# Patient Record
Sex: Female | Born: 1935 | Race: Black or African American | Hispanic: No | Smoking: Never smoker
Health system: Southern US, Community
[De-identification: ages and names within clinical notes are randomized; demographics above are authoritative.]

## PROBLEM LIST (undated history)

## (undated) DIAGNOSIS — J302 Other seasonal allergic rhinitis: Secondary | ICD-10-CM

## (undated) DIAGNOSIS — F0390 Unspecified dementia without behavioral disturbance: Secondary | ICD-10-CM

## (undated) DIAGNOSIS — E785 Hyperlipidemia, unspecified: Secondary | ICD-10-CM

## (undated) DIAGNOSIS — N183 Chronic kidney disease, stage 3 unspecified: Secondary | ICD-10-CM

## (undated) DIAGNOSIS — I509 Heart failure, unspecified: Secondary | ICD-10-CM

## (undated) DIAGNOSIS — E119 Type 2 diabetes mellitus without complications: Secondary | ICD-10-CM

## (undated) DIAGNOSIS — G4730 Sleep apnea, unspecified: Secondary | ICD-10-CM

## (undated) DIAGNOSIS — M1990 Unspecified osteoarthritis, unspecified site: Secondary | ICD-10-CM

## (undated) DIAGNOSIS — IMO0001 Reserved for inherently not codable concepts without codable children: Secondary | ICD-10-CM

## (undated) DIAGNOSIS — G459 Transient cerebral ischemic attack, unspecified: Secondary | ICD-10-CM

## (undated) DIAGNOSIS — I10 Essential (primary) hypertension: Secondary | ICD-10-CM

## (undated) HISTORY — DX: Essential (primary) hypertension: I10

## (undated) HISTORY — DX: Hyperlipidemia, unspecified: E78.5

## (undated) HISTORY — DX: Type 2 diabetes mellitus without complications: E11.9

## (undated) HISTORY — PX: EYE SURGERY: SHX253

## (undated) HISTORY — PX: COLONOSCOPY: SHX174

## (undated) HISTORY — PX: MULTIPLE TOOTH EXTRACTIONS: SHX2053

---

## 1972-08-08 HISTORY — PX: ABDOMINAL HYSTERECTOMY: SHX81

## 2001-01-30 ENCOUNTER — Encounter: Admission: RE | Admit: 2001-01-30 | Discharge: 2001-04-30 | Payer: Self-pay | Admitting: Internal Medicine

## 2001-05-02 ENCOUNTER — Encounter: Admission: RE | Admit: 2001-05-02 | Discharge: 2001-07-31 | Payer: Self-pay | Admitting: Internal Medicine

## 2003-03-17 ENCOUNTER — Encounter: Admission: RE | Admit: 2003-03-17 | Discharge: 2003-03-17 | Payer: Self-pay | Admitting: Internal Medicine

## 2003-03-17 ENCOUNTER — Encounter: Payer: Self-pay | Admitting: Internal Medicine

## 2003-10-14 ENCOUNTER — Ambulatory Visit (HOSPITAL_COMMUNITY): Admission: RE | Admit: 2003-10-14 | Discharge: 2003-10-15 | Payer: Self-pay | Admitting: Ophthalmology

## 2006-09-18 ENCOUNTER — Encounter (INDEPENDENT_AMBULATORY_CARE_PROVIDER_SITE_OTHER): Payer: Self-pay | Admitting: Cardiology

## 2006-09-18 ENCOUNTER — Inpatient Hospital Stay (HOSPITAL_COMMUNITY): Admission: EM | Admit: 2006-09-18 | Discharge: 2006-09-22 | Payer: Self-pay | Admitting: Emergency Medicine

## 2006-10-02 ENCOUNTER — Emergency Department (HOSPITAL_COMMUNITY): Admission: EM | Admit: 2006-10-02 | Discharge: 2006-10-03 | Payer: Self-pay | Admitting: *Deleted

## 2006-10-25 ENCOUNTER — Emergency Department (HOSPITAL_COMMUNITY): Admission: EM | Admit: 2006-10-25 | Discharge: 2006-10-25 | Payer: Self-pay | Admitting: Emergency Medicine

## 2007-04-05 ENCOUNTER — Encounter (HOSPITAL_COMMUNITY): Admission: RE | Admit: 2007-04-05 | Discharge: 2007-07-04 | Payer: Self-pay | Admitting: Nephrology

## 2007-07-12 ENCOUNTER — Encounter (HOSPITAL_COMMUNITY): Admission: RE | Admit: 2007-07-12 | Discharge: 2007-10-10 | Payer: Self-pay | Admitting: Nephrology

## 2007-10-16 ENCOUNTER — Encounter (HOSPITAL_COMMUNITY): Admission: RE | Admit: 2007-10-16 | Discharge: 2007-10-18 | Payer: Self-pay | Admitting: Nephrology

## 2007-10-16 ENCOUNTER — Encounter (HOSPITAL_COMMUNITY): Admission: RE | Admit: 2007-10-16 | Discharge: 2008-01-14 | Payer: Self-pay | Admitting: Nephrology

## 2008-01-24 ENCOUNTER — Encounter (HOSPITAL_COMMUNITY): Admission: RE | Admit: 2008-01-24 | Discharge: 2008-04-23 | Payer: Self-pay | Admitting: Nephrology

## 2008-05-01 ENCOUNTER — Encounter (HOSPITAL_COMMUNITY): Admission: RE | Admit: 2008-05-01 | Discharge: 2008-07-30 | Payer: Self-pay | Admitting: Nephrology

## 2008-08-07 ENCOUNTER — Encounter (HOSPITAL_COMMUNITY): Admission: RE | Admit: 2008-08-07 | Discharge: 2008-08-07 | Payer: Self-pay | Admitting: Nephrology

## 2008-08-08 ENCOUNTER — Encounter (HOSPITAL_COMMUNITY): Admission: RE | Admit: 2008-08-08 | Discharge: 2008-11-06 | Payer: Self-pay | Admitting: Nephrology

## 2008-12-04 ENCOUNTER — Encounter (HOSPITAL_COMMUNITY): Admission: RE | Admit: 2008-12-04 | Discharge: 2009-03-04 | Payer: Self-pay | Admitting: Nephrology

## 2009-03-19 ENCOUNTER — Encounter (HOSPITAL_COMMUNITY): Admission: RE | Admit: 2009-03-19 | Discharge: 2009-06-17 | Payer: Self-pay | Admitting: Nephrology

## 2009-06-30 ENCOUNTER — Encounter (HOSPITAL_COMMUNITY): Admission: RE | Admit: 2009-06-30 | Discharge: 2009-09-28 | Payer: Self-pay | Admitting: Nephrology

## 2009-10-08 ENCOUNTER — Encounter (HOSPITAL_COMMUNITY): Admission: RE | Admit: 2009-10-08 | Discharge: 2010-01-06 | Payer: Self-pay | Admitting: Nephrology

## 2010-01-21 ENCOUNTER — Encounter (HOSPITAL_COMMUNITY): Admission: RE | Admit: 2010-01-21 | Discharge: 2010-04-21 | Payer: Self-pay | Admitting: Nephrology

## 2010-05-11 ENCOUNTER — Encounter (HOSPITAL_COMMUNITY)
Admission: RE | Admit: 2010-05-11 | Discharge: 2010-08-09 | Payer: Self-pay | Source: Home / Self Care | Attending: Nephrology | Admitting: Nephrology

## 2010-08-24 ENCOUNTER — Encounter (HOSPITAL_COMMUNITY)
Admission: RE | Admit: 2010-08-24 | Discharge: 2010-09-07 | Payer: Self-pay | Source: Home / Self Care | Attending: Nephrology | Admitting: Nephrology

## 2010-08-25 LAB — POCT HEMOGLOBIN-HEMACUE: Hemoglobin: 11.5 g/dL — ABNORMAL LOW (ref 12.0–15.0)

## 2010-09-14 ENCOUNTER — Other Ambulatory Visit: Payer: Self-pay

## 2010-09-14 ENCOUNTER — Encounter (HOSPITAL_COMMUNITY)
Admission: RE | Admit: 2010-09-14 | Discharge: 2010-09-14 | Disposition: A | Discharge status: Home / Self Care | Payer: Medicare Other | Source: Ambulatory Visit | Attending: Nephrology | Admitting: Nephrology

## 2010-09-14 DIAGNOSIS — N183 Chronic kidney disease, stage 3 unspecified: Secondary | ICD-10-CM | POA: Insufficient documentation

## 2010-09-14 DIAGNOSIS — D638 Anemia in other chronic diseases classified elsewhere: Secondary | ICD-10-CM | POA: Insufficient documentation

## 2010-09-15 LAB — POCT HEMOGLOBIN-HEMACUE: Hemoglobin: 11.7 g/dL — ABNORMAL LOW (ref 12.0–15.0)

## 2010-10-05 ENCOUNTER — Other Ambulatory Visit: Payer: Self-pay

## 2010-10-05 ENCOUNTER — Encounter (HOSPITAL_COMMUNITY): Payer: Medicare Other

## 2010-10-05 LAB — POCT HEMOGLOBIN-HEMACUE: Hemoglobin: 10.7 g/dL — ABNORMAL LOW (ref 12.0–15.0)

## 2010-10-18 LAB — POCT HEMOGLOBIN-HEMACUE
Hemoglobin: 10.9 g/dL — ABNORMAL LOW (ref 12.0–15.0)
Hemoglobin: 11.2 g/dL — ABNORMAL LOW (ref 12.0–15.0)

## 2010-10-18 LAB — COMPREHENSIVE METABOLIC PANEL WITH GFR
ALT: 15 U/L (ref 0–35)
AST: 21 U/L (ref 0–37)
Albumin: 3.3 g/dL — ABNORMAL LOW (ref 3.5–5.2)
Alkaline Phosphatase: 79 U/L (ref 39–117)
BUN: 23 mg/dL (ref 6–23)
CO2: 33 meq/L — ABNORMAL HIGH (ref 19–32)
Calcium: 9.2 mg/dL (ref 8.4–10.5)
Chloride: 102 meq/L (ref 96–112)
Creatinine, Ser: 1.38 mg/dL — ABNORMAL HIGH (ref 0.4–1.2)
GFR calc non Af Amer: 38 mL/min — ABNORMAL LOW
Glucose, Bld: 105 mg/dL — ABNORMAL HIGH (ref 70–99)
Potassium: 3.5 meq/L (ref 3.5–5.1)
Sodium: 140 meq/L (ref 135–145)
Total Bilirubin: 0.2 mg/dL — ABNORMAL LOW (ref 0.3–1.2)
Total Protein: 6.7 g/dL (ref 6.0–8.3)

## 2010-10-18 LAB — IRON AND TIBC
Iron: 90 ug/dL (ref 42–135)
Saturation Ratios: 36 % (ref 20–55)
TIBC: 250 ug/dL (ref 250–470)
UIBC: 160 ug/dL

## 2010-10-18 LAB — PHOSPHORUS: Phosphorus: 3.3 mg/dL (ref 2.3–4.6)

## 2010-10-18 LAB — FERRITIN: Ferritin: 539 ng/mL — ABNORMAL HIGH (ref 10–291)

## 2010-10-18 LAB — PTH, INTACT AND CALCIUM
Calcium, Total (PTH): 9.3 mg/dL (ref 8.4–10.5)
PTH: 122.1 pg/mL — ABNORMAL HIGH (ref 14.0–72.0)

## 2010-10-19 LAB — POCT HEMOGLOBIN-HEMACUE: Hemoglobin: 11.1 g/dL — ABNORMAL LOW (ref 12.0–15.0)

## 2010-10-20 LAB — POCT HEMOGLOBIN-HEMACUE
Hemoglobin: 11.5 g/dL — ABNORMAL LOW (ref 12.0–15.0)
Hemoglobin: 11.6 g/dL — ABNORMAL LOW (ref 12.0–15.0)

## 2010-10-21 LAB — COMPREHENSIVE METABOLIC PANEL
ALT: 17 U/L (ref 0–35)
AST: 23 U/L (ref 0–37)
Albumin: 3.4 g/dL — ABNORMAL LOW (ref 3.5–5.2)
Alkaline Phosphatase: 76 U/L (ref 39–117)
BUN: 18 mg/dL (ref 6–23)
CO2: 28 mEq/L (ref 19–32)
Calcium: 9 mg/dL (ref 8.4–10.5)
Chloride: 106 mEq/L (ref 96–112)
Creatinine, Ser: 1.26 mg/dL — ABNORMAL HIGH (ref 0.4–1.2)
GFR calc Af Amer: 51 mL/min — ABNORMAL LOW (ref >60)
GFR calc non Af Amer: 42 mL/min — ABNORMAL LOW (ref >60)
Glucose, Bld: 144 mg/dL — ABNORMAL HIGH (ref 70–99)
Potassium: 3.6 mEq/L (ref 3.5–5.1)
Sodium: 142 mEq/L (ref 135–145)
Total Bilirubin: 0.5 mg/dL (ref 0.3–1.2)
Total Protein: 6.9 g/dL (ref 6.0–8.3)

## 2010-10-21 LAB — IRON AND TIBC
Iron: 98 ug/dL (ref 42–135)
Saturation Ratios: 41 % (ref 20–55)
TIBC: 239 ug/dL — ABNORMAL LOW (ref 250–470)
UIBC: 141 ug/dL

## 2010-10-21 LAB — PHOSPHORUS: Phosphorus: 3.5 mg/dL (ref 2.3–4.6)

## 2010-10-21 LAB — CBC
HCT: 36.7 % (ref 36.0–46.0)
Hemoglobin: 11.8 g/dL — ABNORMAL LOW (ref 12.0–15.0)
MCH: 22.9 pg — ABNORMAL LOW (ref 26.0–34.0)
MCHC: 32.2 g/dL (ref 30.0–36.0)
MCV: 71.1 fL — ABNORMAL LOW (ref 78.0–100.0)
Platelets: 212 10*3/uL (ref 150–400)
RBC: 5.16 MIL/uL — ABNORMAL HIGH (ref 3.87–5.11)
RDW: 17.2 % — ABNORMAL HIGH (ref 11.5–15.5)
WBC: 5 10*3/uL (ref 4.0–10.5)

## 2010-10-21 LAB — PTH, INTACT AND CALCIUM
Calcium, Total (PTH): 9 mg/dL (ref 8.4–10.5)
PTH: 136.2 pg/mL — ABNORMAL HIGH (ref 14.0–72.0)

## 2010-10-21 LAB — POCT HEMOGLOBIN-HEMACUE: Hemoglobin: 10.7 g/dL — ABNORMAL LOW (ref 12.0–15.0)

## 2010-10-21 LAB — FERRITIN: Ferritin: 634 ng/mL — ABNORMAL HIGH (ref 10–291)

## 2010-10-23 LAB — POCT HEMOGLOBIN-HEMACUE: Hemoglobin: 10.7 g/dL — ABNORMAL LOW (ref 12.0–15.0)

## 2010-10-24 LAB — COMPREHENSIVE METABOLIC PANEL
ALT: 15 U/L (ref 0–35)
AST: 19 U/L (ref 0–37)
Albumin: 3.2 g/dL — ABNORMAL LOW (ref 3.5–5.2)
Alkaline Phosphatase: 72 U/L (ref 39–117)
BUN: 17 mg/dL (ref 6–23)
CO2: 28 mEq/L (ref 19–32)
Calcium: 8.5 mg/dL (ref 8.4–10.5)
Chloride: 106 mEq/L (ref 96–112)
Creatinine, Ser: 1.12 mg/dL (ref 0.4–1.2)
GFR calc Af Amer: 58 mL/min — ABNORMAL LOW (ref >60)
GFR calc non Af Amer: 48 mL/min — ABNORMAL LOW (ref >60)
Glucose, Bld: 78 mg/dL (ref 70–99)
Potassium: 3.8 mEq/L (ref 3.5–5.1)
Sodium: 139 mEq/L (ref 135–145)
Total Bilirubin: 0.5 mg/dL (ref 0.3–1.2)
Total Protein: 6.8 g/dL (ref 6.0–8.3)

## 2010-10-24 LAB — IRON AND TIBC
Iron: 45 ug/dL (ref 42–135)
Saturation Ratios: 17 % — ABNORMAL LOW (ref 20–55)
TIBC: 265 ug/dL (ref 250–470)
UIBC: 220 ug/dL

## 2010-10-24 LAB — PTH, INTACT AND CALCIUM
Calcium, Total (PTH): 8.5 mg/dL (ref 8.4–10.5)
PTH: 218.8 pg/mL — ABNORMAL HIGH (ref 14.0–72.0)

## 2010-10-24 LAB — POCT HEMOGLOBIN-HEMACUE
Hemoglobin: 13.2 g/dL (ref 12.0–15.0)
Hemoglobin: 12.3 g/dL (ref 12.0–15.0)
Hemoglobin: 11 g/dL — ABNORMAL LOW (ref 12.0–15.0)
Hemoglobin: 10.8 g/dL — ABNORMAL LOW (ref 12.0–15.0)

## 2010-10-24 LAB — FERRITIN: Ferritin: 170 ng/mL (ref 10–291)

## 2010-10-24 LAB — PHOSPHORUS: Phosphorus: 4 mg/dL (ref 2.3–4.6)

## 2010-10-25 LAB — POCT HEMOGLOBIN-HEMACUE: Hemoglobin: 10.1 g/dL — ABNORMAL LOW (ref 12.0–15.0)

## 2010-10-26 ENCOUNTER — Encounter (HOSPITAL_COMMUNITY): Payer: Medicare Other | Attending: Nephrology

## 2010-10-26 ENCOUNTER — Other Ambulatory Visit: Payer: Self-pay

## 2010-10-26 DIAGNOSIS — N183 Chronic kidney disease, stage 3 unspecified: Secondary | ICD-10-CM | POA: Insufficient documentation

## 2010-10-26 DIAGNOSIS — D638 Anemia in other chronic diseases classified elsewhere: Secondary | ICD-10-CM | POA: Insufficient documentation

## 2010-10-26 LAB — POCT HEMOGLOBIN-HEMACUE
Hemoglobin: 11.4 g/dL — ABNORMAL LOW (ref 12.0–15.0)
Hemoglobin: 10.8 g/dL — ABNORMAL LOW (ref 12.0–15.0)

## 2010-10-27 LAB — POCT HEMOGLOBIN-HEMACUE
Hemoglobin: 10.8 g/dL — ABNORMAL LOW (ref 12.0–15.0)
Hemoglobin: 10.9 g/dL — ABNORMAL LOW (ref 12.0–15.0)

## 2010-10-31 LAB — PTH, INTACT AND CALCIUM
Calcium, Total (PTH): 9.3 mg/dL (ref 8.4–10.5)
PTH: 180.7 pg/mL — ABNORMAL HIGH (ref 14.0–72.0)

## 2010-10-31 LAB — COMPREHENSIVE METABOLIC PANEL
ALT: 12 U/L (ref 0–35)
AST: 18 U/L (ref 0–37)
Albumin: 3.4 g/dL — ABNORMAL LOW (ref 3.5–5.2)
Alkaline Phosphatase: 80 U/L (ref 39–117)
BUN: 26 mg/dL — ABNORMAL HIGH (ref 6–23)
CO2: 30 mEq/L (ref 19–32)
Calcium: 9 mg/dL (ref 8.4–10.5)
Chloride: 101 mEq/L (ref 96–112)
Creatinine, Ser: 1.26 mg/dL — ABNORMAL HIGH (ref 0.4–1.2)
GFR calc Af Amer: 51 mL/min — ABNORMAL LOW (ref >60)
GFR calc non Af Amer: 42 mL/min — ABNORMAL LOW (ref >60)
Glucose, Bld: 129 mg/dL — ABNORMAL HIGH (ref 70–99)
Potassium: 3.4 mEq/L — ABNORMAL LOW (ref 3.5–5.1)
Sodium: 138 mEq/L (ref 135–145)
Total Bilirubin: 0.4 mg/dL (ref 0.3–1.2)
Total Protein: 7.5 g/dL (ref 6.0–8.3)

## 2010-10-31 LAB — IRON AND TIBC
Iron: 57 ug/dL (ref 42–135)
Saturation Ratios: 22 % (ref 20–55)
TIBC: 256 ug/dL (ref 250–470)
UIBC: 199 ug/dL

## 2010-10-31 LAB — POCT HEMOGLOBIN-HEMACUE
Hemoglobin: 10.6 g/dL — ABNORMAL LOW (ref 12.0–15.0)
Hemoglobin: 10.9 g/dL — ABNORMAL LOW (ref 12.0–15.0)

## 2010-10-31 LAB — PHOSPHORUS: Phosphorus: 3.7 mg/dL (ref 2.3–4.6)

## 2010-10-31 LAB — FERRITIN: Ferritin: 255 ng/mL (ref 10–291)

## 2010-11-08 LAB — COMPREHENSIVE METABOLIC PANEL
ALT: 11 U/L (ref 0–35)
AST: 20 U/L (ref 0–37)
Albumin: 3.7 g/dL (ref 3.5–5.2)
Alkaline Phosphatase: 69 U/L (ref 39–117)
BUN: 30 mg/dL — ABNORMAL HIGH (ref 6–23)
CO2: 30 mEq/L (ref 19–32)
Calcium: 9.2 mg/dL (ref 8.4–10.5)
Chloride: 93 mEq/L — ABNORMAL LOW (ref 96–112)
Creatinine, Ser: 1.52 mg/dL — ABNORMAL HIGH (ref 0.4–1.2)
GFR calc Af Amer: 41 mL/min — ABNORMAL LOW (ref >60)
GFR calc non Af Amer: 34 mL/min — ABNORMAL LOW (ref >60)
Glucose, Bld: 154 mg/dL — ABNORMAL HIGH (ref 70–99)
Potassium: 3.1 mEq/L — ABNORMAL LOW (ref 3.5–5.1)
Sodium: 134 mEq/L — ABNORMAL LOW (ref 135–145)
Total Bilirubin: 0.5 mg/dL (ref 0.3–1.2)
Total Protein: 7.8 g/dL (ref 6.0–8.3)

## 2010-11-08 LAB — IRON AND TIBC
Iron: 66 ug/dL (ref 42–135)
Saturation Ratios: 23 % (ref 20–55)
TIBC: 293 ug/dL (ref 250–470)
UIBC: 227 ug/dL

## 2010-11-08 LAB — POCT HEMOGLOBIN-HEMACUE: Hemoglobin: 11.7 g/dL — ABNORMAL LOW (ref 12.0–15.0)

## 2010-11-08 LAB — PTH, INTACT AND CALCIUM
Calcium, Total (PTH): 9.2 mg/dL (ref 8.4–10.5)
PTH: 233.8 pg/mL — ABNORMAL HIGH (ref 14.0–72.0)

## 2010-11-08 LAB — FERRITIN: Ferritin: 266 ng/mL (ref 10–291)

## 2010-11-08 LAB — PHOSPHORUS: Phosphorus: 4.4 mg/dL (ref 2.3–4.6)

## 2010-11-10 LAB — POCT HEMOGLOBIN-HEMACUE
Hemoglobin: 10.5 g/dL — ABNORMAL LOW (ref 12.0–15.0)
Hemoglobin: 11.1 g/dL — ABNORMAL LOW (ref 12.0–15.0)

## 2010-11-11 LAB — POCT HEMOGLOBIN-HEMACUE: Hemoglobin: 11.7 g/dL — ABNORMAL LOW (ref 12.0–15.0)

## 2010-11-12 LAB — COMPREHENSIVE METABOLIC PANEL
ALT: 11 U/L (ref 0–35)
AST: 22 U/L (ref 0–37)
Albumin: 3.4 g/dL — ABNORMAL LOW (ref 3.5–5.2)
Alkaline Phosphatase: 70 U/L (ref 39–117)
BUN: 21 mg/dL (ref 6–23)
CO2: 26 mEq/L (ref 19–32)
Calcium: 9.1 mg/dL (ref 8.4–10.5)
Chloride: 106 mEq/L (ref 96–112)
Creatinine, Ser: 1.22 mg/dL — ABNORMAL HIGH (ref 0.4–1.2)
GFR calc Af Amer: 53 mL/min — ABNORMAL LOW (ref >60)
GFR calc non Af Amer: 43 mL/min — ABNORMAL LOW (ref >60)
Glucose, Bld: 133 mg/dL — ABNORMAL HIGH (ref 70–99)
Potassium: 3.6 mEq/L (ref 3.5–5.1)
Sodium: 141 mEq/L (ref 135–145)
Total Bilirubin: 0.6 mg/dL (ref 0.3–1.2)
Total Protein: 7.5 g/dL (ref 6.0–8.3)

## 2010-11-12 LAB — IRON AND TIBC
Iron: 72 ug/dL (ref 42–135)
Saturation Ratios: 27 % (ref 20–55)
TIBC: 262 ug/dL (ref 250–470)
UIBC: 190 ug/dL

## 2010-11-12 LAB — PTH, INTACT AND CALCIUM
Calcium, Total (PTH): 9 mg/dL (ref 8.4–10.5)
PTH: 127.2 pg/mL — ABNORMAL HIGH (ref 14.0–72.0)

## 2010-11-12 LAB — FERRITIN: Ferritin: 256 ng/mL (ref 10–291)

## 2010-11-12 LAB — POCT HEMOGLOBIN-HEMACUE
Hemoglobin: 11.5 g/dL — ABNORMAL LOW (ref 12.0–15.0)
Hemoglobin: 11.1 g/dL — ABNORMAL LOW (ref 12.0–15.0)

## 2010-11-12 LAB — PHOSPHORUS: Phosphorus: 4 mg/dL (ref 2.3–4.6)

## 2010-11-13 LAB — IRON AND TIBC
Iron: 67 ug/dL (ref 42–135)
Saturation Ratios: 25 % (ref 20–55)
TIBC: 272 ug/dL (ref 250–470)
UIBC: 205 ug/dL

## 2010-11-13 LAB — COMPREHENSIVE METABOLIC PANEL
ALT: 11 U/L (ref 0–35)
AST: 19 U/L (ref 0–37)
Albumin: 3.6 g/dL (ref 3.5–5.2)
Alkaline Phosphatase: 73 U/L (ref 39–117)
BUN: 21 mg/dL (ref 6–23)
CO2: 31 mEq/L (ref 19–32)
Calcium: 9.3 mg/dL (ref 8.4–10.5)
Chloride: 101 mEq/L (ref 96–112)
Creatinine, Ser: 1.42 mg/dL — ABNORMAL HIGH (ref 0.4–1.2)
GFR calc Af Amer: 44 mL/min — ABNORMAL LOW (ref >60)
GFR calc non Af Amer: 36 mL/min — ABNORMAL LOW (ref >60)
Glucose, Bld: 112 mg/dL — ABNORMAL HIGH (ref 70–99)
Potassium: 3.1 mEq/L — ABNORMAL LOW (ref 3.5–5.1)
Sodium: 140 mEq/L (ref 135–145)
Total Bilirubin: 0.6 mg/dL (ref 0.3–1.2)
Total Protein: 7.4 g/dL (ref 6.0–8.3)

## 2010-11-13 LAB — PTH, INTACT AND CALCIUM
Calcium, Total (PTH): 9.5 mg/dL (ref 8.4–10.5)
PTH: 149.2 pg/mL — ABNORMAL HIGH (ref 14.0–72.0)

## 2010-11-13 LAB — FERRITIN: Ferritin: 208 ng/mL (ref 10–291)

## 2010-11-13 LAB — PHOSPHORUS: Phosphorus: 3.4 mg/dL (ref 2.3–4.6)

## 2010-11-13 LAB — POCT HEMOGLOBIN-HEMACUE: Hemoglobin: 11.3 g/dL — ABNORMAL LOW (ref 12.0–15.0)

## 2010-11-14 LAB — POCT HEMOGLOBIN-HEMACUE
Hemoglobin: 11.2 g/dL — ABNORMAL LOW (ref 12.0–15.0)
Hemoglobin: 10.6 g/dL — ABNORMAL LOW (ref 12.0–15.0)

## 2010-11-15 LAB — COMPREHENSIVE METABOLIC PANEL
ALT: 9 U/L (ref 0–35)
AST: 22 U/L (ref 0–37)
Albumin: 3.3 g/dL — ABNORMAL LOW (ref 3.5–5.2)
Alkaline Phosphatase: 66 U/L (ref 39–117)
BUN: 36 mg/dL — ABNORMAL HIGH (ref 6–23)
CO2: 29 mEq/L (ref 19–32)
Calcium: 9.2 mg/dL (ref 8.4–10.5)
Chloride: 101 mEq/L (ref 96–112)
Creatinine, Ser: 1.85 mg/dL — ABNORMAL HIGH (ref 0.4–1.2)
GFR calc Af Amer: 33 mL/min — ABNORMAL LOW (ref >60)
GFR calc non Af Amer: 27 mL/min — ABNORMAL LOW (ref >60)
Glucose, Bld: 96 mg/dL (ref 70–99)
Potassium: 3.3 mEq/L — ABNORMAL LOW (ref 3.5–5.1)
Sodium: 138 mEq/L (ref 135–145)
Total Bilirubin: 0.4 mg/dL (ref 0.3–1.2)
Total Protein: 7.3 g/dL (ref 6.0–8.3)

## 2010-11-15 LAB — IRON AND TIBC
Iron: 54 ug/dL (ref 42–135)
Saturation Ratios: 22 % (ref 20–55)
TIBC: 251 ug/dL (ref 250–470)
UIBC: 197 ug/dL

## 2010-11-15 LAB — POCT HEMOGLOBIN-HEMACUE: Hemoglobin: 10.6 g/dL — ABNORMAL LOW (ref 12.0–15.0)

## 2010-11-15 LAB — PTH, INTACT AND CALCIUM
Calcium, Total (PTH): 9.5 mg/dL (ref 8.4–10.5)
PTH: 194.8 pg/mL — ABNORMAL HIGH (ref 14.0–72.0)

## 2010-11-15 LAB — FERRITIN: Ferritin: 331 ng/mL — ABNORMAL HIGH (ref 10–291)

## 2010-11-15 LAB — PHOSPHORUS: Phosphorus: 4.8 mg/dL — ABNORMAL HIGH (ref 2.3–4.6)

## 2010-11-16 ENCOUNTER — Encounter (HOSPITAL_COMMUNITY): Payer: Medicare Other | Attending: Nephrology

## 2010-11-16 ENCOUNTER — Other Ambulatory Visit: Payer: Self-pay | Admitting: Nephrology

## 2010-11-16 DIAGNOSIS — D638 Anemia in other chronic diseases classified elsewhere: Secondary | ICD-10-CM | POA: Insufficient documentation

## 2010-11-16 DIAGNOSIS — N183 Chronic kidney disease, stage 3 unspecified: Secondary | ICD-10-CM | POA: Insufficient documentation

## 2010-11-16 LAB — COMPREHENSIVE METABOLIC PANEL
ALT: 15 U/L (ref 0–35)
AST: 20 U/L (ref 0–37)
Albumin: 3.3 g/dL — ABNORMAL LOW (ref 3.5–5.2)
Alkaline Phosphatase: 82 U/L (ref 39–117)
BUN: 23 mg/dL (ref 6–23)
CO2: 30 mEq/L (ref 19–32)
Calcium: 8.9 mg/dL (ref 8.4–10.5)
Chloride: 100 mEq/L (ref 96–112)
Creatinine, Ser: 1.37 mg/dL — ABNORMAL HIGH (ref 0.4–1.2)
GFR calc Af Amer: 46 mL/min — ABNORMAL LOW (ref >60)
GFR calc non Af Amer: 38 mL/min — ABNORMAL LOW (ref >60)
Glucose, Bld: 193 mg/dL — ABNORMAL HIGH (ref 70–99)
Potassium: 3.7 mEq/L (ref 3.5–5.1)
Sodium: 137 mEq/L (ref 135–145)
Total Bilirubin: 0.4 mg/dL (ref 0.3–1.2)
Total Protein: 7.1 g/dL (ref 6.0–8.3)

## 2010-11-16 LAB — IRON AND TIBC
Iron: 90 ug/dL (ref 42–135)
Saturation Ratios: 35 % (ref 20–55)
TIBC: 254 ug/dL (ref 250–470)
UIBC: 164 ug/dL

## 2010-11-16 LAB — FERRITIN: Ferritin: 451 ng/mL — ABNORMAL HIGH (ref 10–291)

## 2010-11-16 LAB — POCT HEMOGLOBIN-HEMACUE
Hemoglobin: 11.8 g/dL — ABNORMAL LOW (ref 12.0–15.0)
Hemoglobin: 10.9 g/dL — ABNORMAL LOW (ref 12.0–15.0)

## 2010-11-16 LAB — PHOSPHORUS: Phosphorus: 4 mg/dL (ref 2.3–4.6)

## 2010-11-17 LAB — FERRITIN: Ferritin: 294 ng/mL — ABNORMAL HIGH (ref 10–291)

## 2010-11-17 LAB — COMPREHENSIVE METABOLIC PANEL
ALT: 14 U/L (ref 0–35)
AST: 20 U/L (ref 0–37)
Albumin: 3.5 g/dL (ref 3.5–5.2)
Alkaline Phosphatase: 89 U/L (ref 39–117)
BUN: 35 mg/dL — ABNORMAL HIGH (ref 6–23)
CO2: 32 mEq/L (ref 19–32)
Calcium: 9.4 mg/dL (ref 8.4–10.5)
Chloride: 96 mEq/L (ref 96–112)
Creatinine, Ser: 1.54 mg/dL — ABNORMAL HIGH (ref 0.4–1.2)
GFR calc Af Amer: 40 mL/min — ABNORMAL LOW (ref >60)
GFR calc non Af Amer: 33 mL/min — ABNORMAL LOW (ref >60)
Glucose, Bld: 262 mg/dL — ABNORMAL HIGH (ref 70–99)
Potassium: 3.4 mEq/L — ABNORMAL LOW (ref 3.5–5.1)
Sodium: 138 mEq/L (ref 135–145)
Total Bilirubin: 0.6 mg/dL (ref 0.3–1.2)
Total Protein: 7.3 g/dL (ref 6.0–8.3)

## 2010-11-17 LAB — PTH, INTACT AND CALCIUM
Calcium, Total (PTH): 9.5 mg/dL (ref 8.4–10.5)
Calcium, Total (PTH): 9.2 mg/dL (ref 8.4–10.5)
PTH: 131 pg/mL — ABNORMAL HIGH (ref 14.0–72.0)
PTH: 151.9 pg/mL — ABNORMAL HIGH (ref 14.0–72.0)

## 2010-11-17 LAB — IRON AND TIBC
Iron: 62 ug/dL (ref 42–135)
Saturation Ratios: 23 % (ref 20–55)
TIBC: 274 ug/dL (ref 250–470)
UIBC: 212 ug/dL

## 2010-11-17 LAB — PHOSPHORUS: Phosphorus: 3.5 mg/dL (ref 2.3–4.6)

## 2010-11-17 LAB — POCT HEMOGLOBIN-HEMACUE
Hemoglobin: 11.9 g/dL — ABNORMAL LOW (ref 12.0–15.0)
Hemoglobin: 10.7 g/dL — ABNORMAL LOW (ref 12.0–15.0)

## 2010-11-18 LAB — COMPREHENSIVE METABOLIC PANEL
ALT: 11 U/L (ref 0–35)
AST: 15 U/L (ref 0–37)
Albumin: 3.4 g/dL — ABNORMAL LOW (ref 3.5–5.2)
Alkaline Phosphatase: 85 U/L (ref 39–117)
BUN: 28 mg/dL — ABNORMAL HIGH (ref 6–23)
CO2: 31 mEq/L (ref 19–32)
Calcium: 9.2 mg/dL (ref 8.4–10.5)
Chloride: 101 mEq/L (ref 96–112)
Creatinine, Ser: 1.37 mg/dL — ABNORMAL HIGH (ref 0.4–1.2)
GFR calc Af Amer: 46 mL/min — ABNORMAL LOW (ref >60)
GFR calc non Af Amer: 38 mL/min — ABNORMAL LOW (ref >60)
Glucose, Bld: 214 mg/dL — ABNORMAL HIGH (ref 70–99)
Potassium: 3.4 mEq/L — ABNORMAL LOW (ref 3.5–5.1)
Sodium: 140 mEq/L (ref 135–145)
Total Bilirubin: 0.6 mg/dL (ref 0.3–1.2)
Total Protein: 7.3 g/dL (ref 6.0–8.3)

## 2010-11-18 LAB — PHOSPHORUS: Phosphorus: 3.6 mg/dL (ref 2.3–4.6)

## 2010-11-18 LAB — FERRITIN: Ferritin: 253 ng/mL (ref 10–291)

## 2010-11-18 LAB — IRON AND TIBC
Iron: 71 ug/dL (ref 42–135)
Saturation Ratios: 28 % (ref 20–55)
TIBC: 254 ug/dL (ref 250–470)
UIBC: 183 ug/dL

## 2010-11-18 LAB — PTH, INTACT AND CALCIUM
Calcium, Total (PTH): 9 mg/dL (ref 8.4–10.5)
PTH: 166.2 pg/mL — ABNORMAL HIGH (ref 14.0–72.0)

## 2010-11-18 LAB — POCT HEMOGLOBIN-HEMACUE: Hemoglobin: 11.1 g/dL — ABNORMAL LOW (ref 12.0–15.0)

## 2010-11-22 LAB — COMPREHENSIVE METABOLIC PANEL
ALT: 16 U/L (ref 0–35)
AST: 24 U/L (ref 0–37)
Albumin: 3.1 g/dL — ABNORMAL LOW (ref 3.5–5.2)
Alkaline Phosphatase: 73 U/L (ref 39–117)
BUN: 23 mg/dL (ref 6–23)
CO2: 28 mEq/L (ref 19–32)
Calcium: 9.2 mg/dL (ref 8.4–10.5)
Chloride: 106 mEq/L (ref 96–112)
Creatinine, Ser: 1.25 mg/dL — ABNORMAL HIGH (ref 0.4–1.2)
GFR calc Af Amer: 51 mL/min — ABNORMAL LOW (ref >60)
GFR calc non Af Amer: 42 mL/min — ABNORMAL LOW (ref >60)
Glucose, Bld: 113 mg/dL — ABNORMAL HIGH (ref 70–99)
Potassium: 3.8 mEq/L (ref 3.5–5.1)
Sodium: 141 mEq/L (ref 135–145)
Total Bilirubin: 0.8 mg/dL (ref 0.3–1.2)
Total Protein: 6.7 g/dL (ref 6.0–8.3)

## 2010-11-22 LAB — FERRITIN: Ferritin: 149 ng/mL (ref 10–291)

## 2010-11-22 LAB — POCT HEMOGLOBIN-HEMACUE
Hemoglobin: 11.3 g/dL — ABNORMAL LOW (ref 12.0–15.0)
Hemoglobin: 12.2 g/dL (ref 12.0–15.0)

## 2010-11-22 LAB — IRON AND TIBC
Iron: 56 ug/dL (ref 42–135)
Saturation Ratios: 22 % (ref 20–55)
TIBC: 257 ug/dL (ref 250–470)
UIBC: 201 ug/dL

## 2010-11-22 LAB — PHOSPHORUS: Phosphorus: 3.9 mg/dL (ref 2.3–4.6)

## 2010-11-22 LAB — PTH, INTACT AND CALCIUM
Calcium, Total (PTH): 9.2 mg/dL (ref 8.4–10.5)
PTH: 154.9 pg/mL — ABNORMAL HIGH (ref 14.0–72.0)

## 2010-11-23 LAB — POCT HEMOGLOBIN-HEMACUE: Hemoglobin: 11.7 g/dL — ABNORMAL LOW (ref 12.0–15.0)

## 2010-12-07 ENCOUNTER — Other Ambulatory Visit: Payer: Self-pay | Admitting: Nephrology

## 2010-12-07 ENCOUNTER — Encounter (HOSPITAL_COMMUNITY): Payer: Medicare Other | Attending: Nephrology

## 2010-12-07 DIAGNOSIS — D638 Anemia in other chronic diseases classified elsewhere: Secondary | ICD-10-CM | POA: Insufficient documentation

## 2010-12-07 DIAGNOSIS — N183 Chronic kidney disease, stage 3 unspecified: Secondary | ICD-10-CM | POA: Insufficient documentation

## 2010-12-08 LAB — POCT HEMOGLOBIN-HEMACUE: Hemoglobin: 11.9 g/dL — ABNORMAL LOW (ref 12.0–15.0)

## 2010-12-24 NOTE — Discharge Summary (Signed)
NAME:  [name] [name]               ACCOUNT NO.:  [id number]   MEDICAL RECORD NO.:  [id number]          PATIENT TYPE:  INP   LOCATION:  6736                         FACILITY:  [facility]   PHYSICIAN:  Jacquelynn Cree, M.D.   DATE OF BIRTH:  1935-08-20   DATE OF ADMISSION:  09/17/2006  DATE OF DISCHARGE:  09/22/2006                               DISCHARGE SUMMARY   PRIMARY CARE PHYSICIAN:  Robyn N. Baird Cancer, M.D.   DISCHARGE DIAGNOSES:  1. Malignant hypertension.  2. Diabetes.  3. Chronic renal insufficiency.  4. Dyslipidemia.  5. Altered mental status, resolved.  6. History of medical nonadherence.  7. Remote left posterior communicating artery infarct.  8. Elevated BNP.   DISCHARGE MEDICATIONS:  1. Mevacor 40 mg daily.  2. Benicar/hydrochlorothiazide 40/25 mg one tablet daily.  3. Metoprolol 100 mg b.i.d.  4. Lantus insulin 20 units subcutaneously q.h.s.  5. Actos 30 mg daily.  6. Norvasc 10 mg daily.  7. Clonidine 0.1 mg t.i.d.   CONSULTATIONS:  None.   BRIEF HISTORY OF PRESENT ILLNESS:  The patient is a 75 year old female  who developed right arm paresthesias associated with headache but no  blurring of THE vision or no other focal neurologic deficits after  awakening on the date of admission.  Upon initial evaluation in the  emergency department, she was found to be acutely hypertensive with a  blood pressure of 243/123.  She was therefore admitted for further  evaluation and workup.   PROCEDURES AND DIAGNOSTIC STUDIES:  1. Chest x-ray on September 17, 2006, showed cardiomegaly and pulmonary      congestion.  2. CT scan of the head on September 17, 2006, showed no acute      intracranial abnormality, with extensive chronic microvascular      ischemic change and remote left PCA infarct noted.  3. MRI of the brain on September 18, 2006, showed chronic small vessel      changes in the pons and hemispheric white matter.  Old left parieto-      occipital cortical stroke.  No  evidence of acute or reversible      process.  4. 2-D echocardiogram on September 18, 2006, showed normal left      ventricular systolic function with an ejection fraction of 60-70%.      There were no left ventricular regional wall motion abnormalities.      Left ventricular wall thickness was moderately increased.  There      was an increased relative contribution of atrial contraction to      left ventricular filling.  Tissue Doppler parameters were      consistent with elevated left ventricular end-diastolic filling      pressure.  There was mild mitral valvular regurgitation.   DISCHARGE LABORATORY VALUES:  Sodium was 138, potassium 3.6, chloride  106, bicarb 27, BUN 15, creatinine 1.16, glucose 102.  White blood cell  count was 10, hemoglobin 10.5, hematocrit 32.9, platelets 223, with an  MCV of 71.6.   HOSPITAL COURSE BY PROBLEM:  PROBLEM 1.  HYPERTENSIVE URGENCY WITH MALIGNANT HYPERTENSION:  The  patient  was admitted in a hypertensive crisis.  Because of her  concurrent complaints of paresthesias, a stroke workup was undertaken  with CT scanning of the head followed by MRI scanning, which did not  show any acute events.  The patient has been consistently noncompliant  with her medications.  Medications were added to achieve blood pressure  control with the final regimen as outlined above.  At this point, her  blood pressure is fairly stable on the above-noted regimen.  Her  systolic pressures were down in the 140s to [id number] and her diastolic  pressures down in the 70s to 90s.  She will need close follow-up with  her primary care physician to ensure that her blood pressure is  optimally controlled.   Problem 2.  DIABETES:  The patient has been managed on a combination of  Actos and Lantus insulin.  A hemoglobin A1c was checked and found to be  7.4, indicating suboptimal control.  We have increased her Lantus from  15 units to 20 units.  She should follow up with her primary  care  physician regarding ongoing management of her diabetes to achieve a  hemoglobin A1c of less than 7%.   Problem 3.  ALTERED MENTAL STATUS:  The patient's altered mental status  was thought to be delirium secondary to hypertensive encephalopathy.  Nevertheless, a workup was undertaken.  Her TSH was normal at 0.614.  Her vitamin B12 level was normal at 472.  Her ammonia was not elevated.  She she did not have hyperhomocysteinemia.  Her RPR was nonreactive.  Her acute delirium resolved completely with control of her blood  pressure.   Problem 4.  RENAL INSUFFICIENCY:  The patient's renal insufficiency is  likely secondary to diabetic and hypertensive nephrosclerosis.  Her  current GFR is 56 mL/min.  She will need close following of her renal  function and prompt referral to a nephrologist as an outpatient should  her renal function continue to decline.   Problem 5.  DYSLIPIDEMIA:  The patient's lipid profile was checked.  Her  total cholesterol was 226, triglycerides 112, HDL 58, and LDL 146.  She  was put back on Mevacor for lipid control.  She should have a follow-up  CMT done in six week's time.   Problem 6.  MEDICAL NONCOMPLIANCE:  The patient was counseled  extensively regarding importance of taking her medications exactly as  prescribed.   Problem 7.  NORMOCYTIC ANEMIA:  The patient did have iron studies as  well as B12 and folate studies done.  Her B12 and folate were normal.  Her ferritin was 69.  Her iron was 45, total iron-binding capacity 250,  with 18% saturation.  These findings are consistent with anemia of  chronic disease.  However, she does have somewhat low iron stores and  consideration can be made for further evaluating this is an outpatient.   Problem 8.  ELEVATED BNP:  The patient did have an elevated BNP;  however, a 2-D echocardiogram failed to show any evidence of congestive  heart failure.  DISPOSITION:  The patient is stable for discharge home.  She  is  instructed to follow up with Dr. Baird Cancer early next week.      Jacquelynn Cree, M.D.  Electronically Signed     CR/MEDQ  D:  09/22/2006  T:  09/22/2006  Job:  [id number]   cc:   Theda Belfast. Baird Cancer, M.D.

## 2010-12-24 NOTE — Op Note (Signed)
NAME:  [name] [name], [name] [name]                         ACCOUNT NO.:  [id number]   MEDICAL RECORD NO.:  [id number]                   PATIENT TYPE:  OIB   LOCATION:  3172                                 FACILITY:  [facility]   PHYSICIAN:  Chrystie Nose. Zigmund Daniel, M.D.              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   POSTOPERATIVE DIAGNOSIS:  Proliferative diabetic retinopathy with posterior  vitreous membranes, right eye.   OPERATION PERFORMED:  Pars plana vitrectomy with membrane peel, panretinal  photocoagulation and intravitreal Kenalog injection, right eye.   SURGEON:  Chrystie Nose. Zigmund Daniel, M.D.   ASSISTANT:  Thurman Coyer. Lundquist, P.A.   ANESTHESIA:  General.   DESCRIPTION OF PROCEDURE:  Usual prep and drape.  Peritomies at 8, 10 and 2  o'clock.  The 4 mm infusion port was anchored into place at 8 o'clock.  The  lighted pick and the cutter were placed at 10 and 2 o'clock respectively.  ProVisc was placed on the corneal surface and the Biom viewing system was  moved into place.  The pars plana vitrectomy was begun just behind the  cataractous lens.  Blood and vitreous were carefully removed under low  suction and rapid cutting down to the macular surface.  A bright mirror type  sheen was seen in the posterior hyaloid.  This was attached to the retinal  vessels at certain places.  The entire sheen was removed carefully under low  suction, rapid cutting and with a lighted pick.  Each attachment was  circumcised with the vitreous cutter. The vitreous membranes were peeled  from their attachments to the disk with the lighted pick.  The vitrectomy  was carried out to the far periphery where large clots of vitreous and blood  were encountered.  These were removed carefully with scleral depression.  Once  the entire vitreous cavity was cleansed, the Endo laser was placed in  the eye.  640 burns were placed around the retinal periphery with a power of  1300 mW, 1000 microns each and 0.1 seconds each.  A washout procedure was  performed and the [facility] brush was used for surface cleaning.  The instruments were removed from the eye and 9-0 nylon was used to close  the sclerotomy sites.  They were tested and found to be tight with a Weck-  cel sponge test.  The conjunctiva was closed with wet field cautery.  Intravitreal Kenalog 0.1 mL was injected into the vitreous cavity.  Polymyxin and gentamicin were irrigated into Tenon's space.  Atropine  solution was applied.  Decadron 10 mg was injected into the lower  subconjunctival space.  Marcaine was injected around the globe.  Polysporin,  a patch and shield were placed.  The patient was  awakened and taken to  recovery in satisfactory condition.  The closing tension was 15 with a  [place] tonometer.                                               Chrystie Nose. Zigmund Daniel, M.D.    JDM/MEDQ  D:  10/14/2003  T:  10/15/2003  Job:  [id number]

## 2010-12-24 NOTE — H&P (Signed)
NAME:  [name] [name], [name] [name]               ACCOUNT NO.:  [id number]   MEDICAL RECORD NO.:  [id number]          PATIENT TYPE:  INP   LOCATION:  3310                         FACILITY:  [facility]   PHYSICIAN:  Hind I Elsaid, MD      DATE OF BIRTH:  [birth year]   DATE OF ADMISSION:  09/18/2006  DATE OF DISCHARGE:                              HISTORY & PHYSICAL   PRIMARY CARE PHYSICIAN:  Dorita Fray and [facility].   CHIEF COMPLAINT:  Right arm and hand numbness for 1 day, which is  resolved by this time.   HISTORY OF PRESENT ILLNESS:  A 75 year old with a history of diabetes  mellitus, hypertension, last visit to the primary physician was 3 weeks  ago with no further adjustment of medication.  The patient also has mild  dementia according to her husband.  Today, after she went to church, she  went to sleep, and then she woke up with a right arm and hand numbness  associated with headache but no blurring of vision, no other weakness or  numbness of the extremities noted, no facial drooping, which lasts about  1 to 2 hours, but the patient cannot recall how [place] the numbness has  been there.  The patient is seen in the emergency room.  This time, the  patient admitted the numbness and weakness of her right arm completely  resolved, and denies any drooling of saliva, denies any twisting of the  face, denies any blurring of vision, denies any weakness or numbness of  the upper extremities, denies any abnormal control of her urine and  bowel.   PAST MEDICAL HISTORY:  History of diabetes mellitus, history of  hypertension, history of mild dementia.   MEDICATIONS:  Insulin Lantus 15 units subcu at bedtime and one medicine  for blood pressure control according to the husband and the wife.   ALLERGIES:  PENICILLIN.   PAST SURGICAL HISTORY:  History of right eye surgery due to retinal  hemorrhage.  Also seemed to be the patient has bad control of  hypertension and diabetes.   FAMILY HISTORY:  The  patient cannot recognize, cannot remember what her  mom or father died of.   SOCIAL HISTORY:  No history of smoking, no history of alcohol use, no  history of IV drug use.  She lives with her husband, and she is not  working.  She has grown 3 kids.   PHYSICAL EXAMINATION:  On examination in the emergency room:  VITAL SIGNS:  Blood pressure 243/123, pulse rate 89, respiratory rate  22, pulse ox 98% on room air, blood pressure dropped to 172/71 in the  emergency room.  CNS EXAMINATION:  The patient has extraocular muscles, movements  equally.  Cranial nerves from I to XII intact.  EXTREMITIES:  Upper extremities:  The patient moves her upper  extremities, 5/5 sensation intact bilaterally.  Extensor, pronator, and  movement muscle is within normal limits.  Lower extremity:  The patient  moves all her extremities and no sensory loss.  NECK:  There is no thyromegaly, no JVD.  EXAMINATION OF THE  HEART:  S1 and S2, mild tachycardia, no added sounds.  LUNG EXAMINATION:  Normal physical breathing.  ABDOMEN:  Soft, nontender.  Bowel sounds positive bilaterally.  EXTREMITIES:  No lower limb edema.  Peripheral pulses intact.   LABORATORY:  White blood cells 7.7, hemoglobin 11.5, hematocrit 36,  platelets 271.  Sodium 136, potassium 3.5, chloride 104, carbon dioxide  24, glucose 149, BUN 19, creatinine 1.1.   ASSESSMENT/PLAN:  Hypertensive emergency/__________.  Will admit he  patient to step down for control of the blood pressure.  We will start  the patient on labetalol drip.  Aim is to keep the systolic blood  pressure above or equal to 180 in the first 24 hours; further adjustment  to be done after 24 hours for blood pressure medication.  Will order,  for the numbness on the lower extremity muscle most probably due to  hypertensive, MRI of the brain.  Also CT scan of the head was negative.  MRI of the brain, MRA of the neck, and carotid Doppler ruling other  causes of transient ischemic  attack.  For the diabetes mellitus, we will  get hemoglobin A1c and microalbuminuria.  We will start the patient on  insulin Lantus.  Further will initiate NovoLog sliding scale.  Further  recommendation to be done during hospitalization.  DVT and GI  prophylaxis.      Hind Franco Collet, MD  Electronically Signed     HIE/MEDQ  D:  09/18/2006  T:  09/18/2006  Job:  [id number]

## 2010-12-28 ENCOUNTER — Encounter (HOSPITAL_COMMUNITY): Payer: No Typology Code available for payment source

## 2011-01-04 ENCOUNTER — Other Ambulatory Visit: Payer: Self-pay | Admitting: Nephrology

## 2011-01-04 ENCOUNTER — Encounter (HOSPITAL_COMMUNITY): Payer: Medicare Other

## 2011-01-04 LAB — POCT HEMOGLOBIN-HEMACUE: Hemoglobin: 11.2 g/dL — ABNORMAL LOW (ref 12.0–15.0)

## 2011-02-01 ENCOUNTER — Other Ambulatory Visit: Payer: Self-pay | Admitting: Nephrology

## 2011-02-01 ENCOUNTER — Encounter (HOSPITAL_COMMUNITY): Payer: Medicare (Managed Care) | Attending: Nephrology

## 2011-02-01 DIAGNOSIS — N183 Chronic kidney disease, stage 3 unspecified: Secondary | ICD-10-CM | POA: Insufficient documentation

## 2011-02-01 DIAGNOSIS — D638 Anemia in other chronic diseases classified elsewhere: Secondary | ICD-10-CM | POA: Insufficient documentation

## 2011-02-01 LAB — IRON AND TIBC
Iron: 64 ug/dL (ref 42–135)
Saturation Ratios: 26 % (ref 20–55)
TIBC: 244 ug/dL — ABNORMAL LOW (ref 250–470)
UIBC: 180 ug/dL

## 2011-02-01 LAB — COMPREHENSIVE METABOLIC PANEL
ALT: 13 U/L (ref 0–35)
AST: 18 U/L (ref 0–37)
Albumin: 3.2 g/dL — ABNORMAL LOW (ref 3.5–5.2)
Alkaline Phosphatase: 99 U/L (ref 39–117)
BUN: 21 mg/dL (ref 6–23)
CO2: 29 mEq/L (ref 19–32)
Calcium: 8.6 mg/dL (ref 8.4–10.5)
Chloride: 103 mEq/L (ref 96–112)
Creatinine, Ser: 1.39 mg/dL — ABNORMAL HIGH (ref 0.50–1.10)
GFR calc Af Amer: 45 mL/min — ABNORMAL LOW (ref >60)
GFR calc non Af Amer: 37 mL/min — ABNORMAL LOW (ref >60)
Glucose, Bld: 169 mg/dL — ABNORMAL HIGH (ref 70–99)
Potassium: 3.7 mEq/L (ref 3.5–5.1)
Sodium: 138 mEq/L (ref 135–145)
Total Bilirubin: 0.2 mg/dL — ABNORMAL LOW (ref 0.3–1.2)
Total Protein: 7.1 g/dL (ref 6.0–8.3)

## 2011-02-01 LAB — PHOSPHORUS: Phosphorus: 3.8 mg/dL (ref 2.3–4.6)

## 2011-02-01 LAB — FERRITIN: Ferritin: 439 ng/mL — ABNORMAL HIGH (ref 10–291)

## 2011-02-01 LAB — POCT HEMOGLOBIN-HEMACUE: Hemoglobin: 10.7 g/dL — ABNORMAL LOW (ref 12.0–15.0)

## 2011-02-02 LAB — PTH, INTACT AND CALCIUM
Calcium, Total (PTH): 8.8 mg/dL (ref 8.4–10.5)
PTH: 267.1 pg/mL — ABNORMAL HIGH (ref 14.0–72.0)

## 2011-03-01 ENCOUNTER — Encounter (HOSPITAL_COMMUNITY)
Admission: RE | Admit: 2011-03-01 | Discharge: 2011-03-01 | Disposition: A | Discharge status: Home / Self Care | Payer: Medicare (Managed Care) | Source: Ambulatory Visit | Attending: Nephrology | Admitting: Nephrology

## 2011-03-01 ENCOUNTER — Other Ambulatory Visit: Payer: Self-pay | Admitting: Nephrology

## 2011-03-01 DIAGNOSIS — D638 Anemia in other chronic diseases classified elsewhere: Secondary | ICD-10-CM | POA: Insufficient documentation

## 2011-03-01 DIAGNOSIS — N183 Chronic kidney disease, stage 3 unspecified: Secondary | ICD-10-CM | POA: Insufficient documentation

## 2011-03-01 LAB — POCT HEMOGLOBIN-HEMACUE: Hemoglobin: 10.6 g/dL — ABNORMAL LOW (ref 12.0–15.0)

## 2011-03-29 ENCOUNTER — Encounter (HOSPITAL_COMMUNITY): Payer: Medicare (Managed Care) | Attending: Nephrology

## 2011-03-29 ENCOUNTER — Other Ambulatory Visit: Payer: Self-pay | Admitting: Nephrology

## 2011-03-29 DIAGNOSIS — N183 Chronic kidney disease, stage 3 unspecified: Secondary | ICD-10-CM | POA: Insufficient documentation

## 2011-03-29 DIAGNOSIS — D638 Anemia in other chronic diseases classified elsewhere: Secondary | ICD-10-CM | POA: Insufficient documentation

## 2011-03-29 LAB — POCT HEMOGLOBIN-HEMACUE: Hemoglobin: 10.5 g/dL — ABNORMAL LOW (ref 12.0–15.0)

## 2011-04-26 ENCOUNTER — Encounter (HOSPITAL_COMMUNITY): Payer: Medicare (Managed Care)

## 2011-04-28 LAB — HEMOGLOBIN AND HEMATOCRIT, BLOOD
HCT: 36.6
Hemoglobin: 11.6 — ABNORMAL LOW

## 2011-04-29 LAB — IRON AND TIBC
Iron: 60
Saturation Ratios: 21
TIBC: 292
UIBC: 232

## 2011-04-29 LAB — RENAL FUNCTION PANEL
Albumin: 3.2 — ABNORMAL LOW
BUN: 32 — ABNORMAL HIGH
CO2: 32
Calcium: 9.2
Chloride: 97
Creatinine, Ser: 1.55 — ABNORMAL HIGH
GFR calc Af Amer: 40 — ABNORMAL LOW
GFR calc non Af Amer: 33 — ABNORMAL LOW
Glucose, Bld: 64 — ABNORMAL LOW
Phosphorus: 3.7
Potassium: 3.7
Sodium: 138

## 2011-04-29 LAB — CBC
HCT: 36.7
Hemoglobin: 11.7 — ABNORMAL LOW
MCHC: 32
MCV: 72.2 — ABNORMAL LOW
Platelets: 264
RBC: 5.09
RDW: 20.1 — ABNORMAL HIGH
WBC: 5.4

## 2011-04-29 LAB — PTH, INTACT AND CALCIUM
Calcium, Total (PTH): 9.6
PTH: 190 — ABNORMAL HIGH

## 2011-04-29 LAB — FERRITIN: Ferritin: 106 (ref 10–291)

## 2011-05-02 LAB — POCT HEMOGLOBIN-HEMACUE
Hemoglobin: 10.6 — ABNORMAL LOW
Operator id: 181601

## 2011-05-03 ENCOUNTER — Encounter (HOSPITAL_COMMUNITY): Payer: Medicare Other | Attending: Nephrology

## 2011-05-03 ENCOUNTER — Other Ambulatory Visit: Payer: Self-pay | Admitting: Nephrology

## 2011-05-03 DIAGNOSIS — N183 Chronic kidney disease, stage 3 unspecified: Secondary | ICD-10-CM | POA: Insufficient documentation

## 2011-05-03 DIAGNOSIS — D638 Anemia in other chronic diseases classified elsewhere: Secondary | ICD-10-CM | POA: Insufficient documentation

## 2011-05-03 LAB — COMPREHENSIVE METABOLIC PANEL
ALT: 8 U/L (ref 0–35)
AST: 15 U/L (ref 0–37)
Albumin: 2.9 g/dL — ABNORMAL LOW (ref 3.5–5.2)
Alkaline Phosphatase: 100 U/L (ref 39–117)
BUN: 14 mg/dL (ref 6–23)
CO2: 31 mEq/L (ref 19–32)
Calcium: 9.1 mg/dL (ref 8.4–10.5)
Chloride: 102 mEq/L (ref 96–112)
Creatinine, Ser: 1.09 mg/dL (ref 0.50–1.10)
GFR calc Af Amer: 60 mL/min — ABNORMAL LOW (ref >60)
GFR calc non Af Amer: 49 mL/min — ABNORMAL LOW (ref >60)
Glucose, Bld: 185 mg/dL — ABNORMAL HIGH (ref 70–99)
Potassium: 3.4 mEq/L — ABNORMAL LOW (ref 3.5–5.1)
Sodium: 140 mEq/L (ref 135–145)
Total Bilirubin: 0.2 mg/dL — ABNORMAL LOW (ref 0.3–1.2)
Total Protein: 7.4 g/dL (ref 6.0–8.3)

## 2011-05-03 LAB — IRON AND TIBC
Iron: 107
Iron: 73 ug/dL (ref 42–135)
Saturation Ratios: 35
Saturation Ratios: 32 % (ref 20–55)
TIBC: 310
TIBC: 228 ug/dL — ABNORMAL LOW (ref 250–470)
UIBC: 203
UIBC: 155 ug/dL (ref 125–400)

## 2011-05-03 LAB — CBC
HCT: 36.8
Hemoglobin: 11.6 — ABNORMAL LOW
MCHC: 31.7
MCV: 73.4 — ABNORMAL LOW
Platelets: 270
RBC: 5.01
RDW: 21.4 — ABNORMAL HIGH
WBC: 4.6

## 2011-05-03 LAB — FERRITIN
Ferritin: 192 (ref 10–291)
Ferritin: 455 ng/mL — ABNORMAL HIGH (ref 10–291)

## 2011-05-03 LAB — PHOSPHORUS: Phosphorus: 3.7 mg/dL (ref 2.3–4.6)

## 2011-05-03 LAB — POCT HEMOGLOBIN-HEMACUE: Hemoglobin: 11 g/dL — ABNORMAL LOW (ref 12.0–15.0)

## 2011-05-04 LAB — PTH, INTACT AND CALCIUM
Calcium, Total (PTH): 8.8 mg/dL (ref 8.4–10.5)
PTH: 143.7 pg/mL — ABNORMAL HIGH (ref 14.0–72.0)

## 2011-05-05 LAB — FERRITIN: Ferritin: 118 (ref 10–291)

## 2011-05-05 LAB — CBC
HCT: 35.6 — ABNORMAL LOW
HCT: 37.1
Hemoglobin: 11.4 — ABNORMAL LOW
Hemoglobin: 12
MCHC: 32
MCHC: 32.5
MCV: 74.7 — ABNORMAL LOW
MCV: 75.4 — ABNORMAL LOW
Platelets: 299
Platelets: 232
RBC: 4.76
RBC: 4.92
RDW: 21.1 — ABNORMAL HIGH
RDW: 20.7 — ABNORMAL HIGH
WBC: 4.6
WBC: 6.1

## 2011-05-05 LAB — IRON AND TIBC
Iron: 51
Saturation Ratios: 18 — ABNORMAL LOW
TIBC: 289
UIBC: 238

## 2011-05-06 LAB — POCT I-STAT 4, (NA,K, GLUC, HGB,HCT)
Glucose, Bld: 77
Glucose, Bld: 92
HCT: 38
HCT: 35 — ABNORMAL LOW
Hemoglobin: 12.9
Hemoglobin: 11.9 — ABNORMAL LOW
Operator id: 181601
Potassium: 3.6
Potassium: 3.5
Sodium: 140
Sodium: 140

## 2011-05-09 LAB — POCT HEMOGLOBIN-HEMACUE: Hemoglobin: 9.9 g/dL — ABNORMAL LOW (ref 12.0–15.0)

## 2011-05-10 LAB — COMPREHENSIVE METABOLIC PANEL
ALT: 9 U/L (ref 0–35)
AST: 26 U/L (ref 0–37)
Albumin: 3.3 g/dL — ABNORMAL LOW (ref 3.5–5.2)
Alkaline Phosphatase: 67 U/L (ref 39–117)
BUN: 38 mg/dL — ABNORMAL HIGH (ref 6–23)
CO2: 33 mEq/L — ABNORMAL HIGH (ref 19–32)
Calcium: 9.1 mg/dL (ref 8.4–10.5)
Chloride: 102 mEq/L (ref 96–112)
Creatinine, Ser: 1.88 mg/dL — ABNORMAL HIGH (ref 0.4–1.2)
GFR calc Af Amer: 32 mL/min — ABNORMAL LOW (ref >60)
GFR calc non Af Amer: 26 mL/min — ABNORMAL LOW (ref >60)
Glucose, Bld: 73 mg/dL (ref 70–99)
Potassium: 3.7 mEq/L (ref 3.5–5.1)
Sodium: 141 mEq/L (ref 135–145)
Total Bilirubin: 0.7 mg/dL (ref 0.3–1.2)
Total Protein: 7.2 g/dL (ref 6.0–8.3)

## 2011-05-10 LAB — PTH, INTACT AND CALCIUM
Calcium, Total (PTH): 9.4 mg/dL (ref 8.4–10.5)
PTH: 147.4 pg/mL — ABNORMAL HIGH (ref 14.0–72.0)

## 2011-05-10 LAB — PHOSPHORUS: Phosphorus: 3.9 mg/dL (ref 2.3–4.6)

## 2011-05-10 LAB — FERRITIN: Ferritin: 113 ng/mL (ref 10–291)

## 2011-05-10 LAB — POCT HEMOGLOBIN-HEMACUE
Hemoglobin: 10.6 g/dL — ABNORMAL LOW (ref 12.0–15.0)
Hemoglobin: 11 g/dL — ABNORMAL LOW (ref 12.0–15.0)
Hemoglobin: 11.5 g/dL — ABNORMAL LOW (ref 12.0–15.0)

## 2011-05-10 LAB — HEMOGLOBIN AND HEMATOCRIT, BLOOD
HCT: 35.3 % — ABNORMAL LOW (ref 36.0–46.0)
Hemoglobin: 11 g/dL — ABNORMAL LOW (ref 12.0–15.0)

## 2011-05-10 LAB — IRON AND TIBC
Iron: 58 ug/dL (ref 42–135)
Saturation Ratios: 20 % (ref 20–55)
TIBC: 290 ug/dL (ref 250–470)
UIBC: 232 ug/dL

## 2011-05-11 LAB — POCT HEMOGLOBIN-HEMACUE: Hemoglobin: 9.5 — ABNORMAL LOW

## 2011-05-13 LAB — POCT HEMOGLOBIN-HEMACUE
Hemoglobin: 10.7 g/dL — ABNORMAL LOW (ref 12.0–15.0)
Hemoglobin: 10.8 g/dL — ABNORMAL LOW (ref 12.0–15.0)
Hemoglobin: 10.8 g/dL — ABNORMAL LOW (ref 12.0–15.0)

## 2011-05-13 LAB — CBC
HCT: 35.2 — ABNORMAL LOW
Hemoglobin: 11.1 — ABNORMAL LOW
MCHC: 31.7
MCV: 72.5 — ABNORMAL LOW
Platelets: 272
RBC: 4.85
RDW: 18.4 — ABNORMAL HIGH
WBC: 5.4

## 2011-05-17 LAB — RENAL FUNCTION PANEL
Albumin: 3 — ABNORMAL LOW
BUN: 24 — ABNORMAL HIGH
CO2: 29
Calcium: 8.4
Chloride: 104
Creatinine, Ser: 1.35 — ABNORMAL HIGH
GFR calc Af Amer: 47 — ABNORMAL LOW
GFR calc non Af Amer: 39 — ABNORMAL LOW
Glucose, Bld: 89
Phosphorus: 3.7
Potassium: 3.8
Sodium: 141

## 2011-05-17 LAB — PTH, INTACT AND CALCIUM
Calcium, Total (PTH): 8.2 — ABNORMAL LOW
PTH: 246.7 — ABNORMAL HIGH

## 2011-05-17 LAB — IRON AND TIBC
Iron: 66
Saturation Ratios: 22
TIBC: 298
UIBC: 232

## 2011-05-17 LAB — CBC
HCT: 32.7 — ABNORMAL LOW
Hemoglobin: 10.5 — ABNORMAL LOW
MCHC: 32
MCV: 72.7 — ABNORMAL LOW
Platelets: 265
RBC: 4.5
RDW: 17.4 — ABNORMAL HIGH
WBC: 4.3

## 2011-05-17 LAB — FERRITIN: Ferritin: 63 (ref 10–291)

## 2011-05-18 LAB — HEMOGLOBIN AND HEMATOCRIT, BLOOD
HCT: 35 — ABNORMAL LOW
Hemoglobin: 11.1 — ABNORMAL LOW

## 2011-05-19 LAB — HEMOGLOBIN AND HEMATOCRIT, BLOOD
HCT: 33.2 — ABNORMAL LOW
Hemoglobin: 10.6 — ABNORMAL LOW

## 2011-05-31 ENCOUNTER — Other Ambulatory Visit: Payer: Self-pay | Admitting: Nephrology

## 2011-05-31 ENCOUNTER — Encounter (HOSPITAL_COMMUNITY): Payer: Medicare Other | Attending: Nephrology

## 2011-05-31 DIAGNOSIS — N183 Chronic kidney disease, stage 3 unspecified: Secondary | ICD-10-CM | POA: Insufficient documentation

## 2011-05-31 DIAGNOSIS — D638 Anemia in other chronic diseases classified elsewhere: Secondary | ICD-10-CM | POA: Insufficient documentation

## 2011-05-31 LAB — COMPREHENSIVE METABOLIC PANEL
ALT: 9 U/L (ref 0–35)
AST: 14 U/L (ref 0–37)
Albumin: 3.1 g/dL — ABNORMAL LOW (ref 3.5–5.2)
Alkaline Phosphatase: 100 U/L (ref 39–117)
BUN: 22 mg/dL (ref 6–23)
CO2: 29 mEq/L (ref 19–32)
Calcium: 9.5 mg/dL (ref 8.4–10.5)
Chloride: 99 mEq/L (ref 96–112)
Creatinine, Ser: 1.19 mg/dL — ABNORMAL HIGH (ref 0.50–1.10)
GFR calc Af Amer: 51 mL/min — ABNORMAL LOW (ref >90)
GFR calc non Af Amer: 44 mL/min — ABNORMAL LOW (ref >90)
Glucose, Bld: 183 mg/dL — ABNORMAL HIGH (ref 70–99)
Potassium: 3.1 mEq/L — ABNORMAL LOW (ref 3.5–5.1)
Sodium: 140 mEq/L (ref 135–145)
Total Bilirubin: 0.3 mg/dL (ref 0.3–1.2)
Total Protein: 7.4 g/dL (ref 6.0–8.3)

## 2011-05-31 LAB — POCT HEMOGLOBIN-HEMACUE: Hemoglobin: 11.4 g/dL — ABNORMAL LOW (ref 12.0–15.0)

## 2011-05-31 LAB — IRON AND TIBC
Iron: 92 ug/dL (ref 42–135)
Saturation Ratios: 38 % (ref 20–55)
TIBC: 239 ug/dL — ABNORMAL LOW (ref 250–470)
UIBC: 147 ug/dL (ref 125–400)

## 2011-05-31 LAB — PHOSPHORUS: Phosphorus: 3.4 mg/dL (ref 2.3–4.6)

## 2011-05-31 LAB — FERRITIN: Ferritin: 447 ng/mL — ABNORMAL HIGH (ref 10–291)

## 2011-06-01 LAB — PTH, INTACT AND CALCIUM
Calcium, Total (PTH): 9.2 mg/dL (ref 8.4–10.5)
PTH: 107.4 pg/mL — ABNORMAL HIGH (ref 14.0–72.0)

## 2011-06-27 ENCOUNTER — Other Ambulatory Visit (HOSPITAL_COMMUNITY): Payer: Self-pay | Admitting: *Deleted

## 2011-06-28 ENCOUNTER — Encounter (HOSPITAL_COMMUNITY)
Admission: RE | Admit: 2011-06-28 | Discharge: 2011-06-28 | Disposition: A | Discharge status: Home / Self Care | Payer: Medicare Other | Source: Ambulatory Visit | Attending: Nephrology | Admitting: Nephrology

## 2011-06-28 DIAGNOSIS — N183 Chronic kidney disease, stage 3 unspecified: Secondary | ICD-10-CM | POA: Insufficient documentation

## 2011-06-28 DIAGNOSIS — D638 Anemia in other chronic diseases classified elsewhere: Secondary | ICD-10-CM | POA: Insufficient documentation

## 2011-06-28 LAB — POCT HEMOGLOBIN-HEMACUE: Hemoglobin: 10.3 g/dL — ABNORMAL LOW (ref 12.0–15.0)

## 2011-06-28 MED ORDER — EPOETIN ALFA 10000 UNIT/ML IJ SOLN
30000.0000 [IU] | INTRAMUSCULAR | Status: DC
  Administered 2011-06-28: 30000 [IU] via SUBCUTANEOUS
  Filled 2011-06-28: qty 1

## 2011-07-26 ENCOUNTER — Encounter (HOSPITAL_COMMUNITY)
Admission: RE | Admit: 2011-07-26 | Discharge: 2011-07-26 | Disposition: A | Discharge status: Home / Self Care | Payer: Medicare Other | Source: Ambulatory Visit | Attending: Nephrology | Admitting: Nephrology

## 2011-07-26 DIAGNOSIS — D638 Anemia in other chronic diseases classified elsewhere: Secondary | ICD-10-CM | POA: Insufficient documentation

## 2011-07-26 DIAGNOSIS — N183 Chronic kidney disease, stage 3 unspecified: Secondary | ICD-10-CM | POA: Insufficient documentation

## 2011-07-26 LAB — POCT HEMOGLOBIN-HEMACUE: Hemoglobin: 10.3 g/dL — ABNORMAL LOW (ref 12.0–15.0)

## 2011-07-26 MED ORDER — EPOETIN ALFA 20000 UNIT/ML IJ SOLN
INTRAMUSCULAR | Status: AC
  Administered 2011-07-26: 20000 [IU]
  Filled 2011-07-26: qty 1

## 2011-07-26 MED ORDER — EPOETIN ALFA 10000 UNIT/ML IJ SOLN
INTRAMUSCULAR | Status: AC
  Administered 2011-07-26: 10000 [IU]
  Filled 2011-07-26: qty 1

## 2011-07-26 MED ORDER — EPOETIN ALFA 10000 UNIT/ML IJ SOLN: 30000.0000 [IU] | INTRAMUSCULAR | Status: DC

## 2011-08-23 ENCOUNTER — Encounter (HOSPITAL_COMMUNITY)
Admission: RE | Admit: 2011-08-23 | Discharge: 2011-08-23 | Disposition: A | Discharge status: Home / Self Care | Payer: Medicare Other | Source: Ambulatory Visit | Attending: Nephrology | Admitting: Nephrology

## 2011-08-23 DIAGNOSIS — D638 Anemia in other chronic diseases classified elsewhere: Secondary | ICD-10-CM | POA: Insufficient documentation

## 2011-08-23 DIAGNOSIS — N183 Chronic kidney disease, stage 3 unspecified: Secondary | ICD-10-CM | POA: Insufficient documentation

## 2011-08-23 MED ORDER — EPOETIN ALFA 20000 UNIT/ML IJ SOLN
INTRAMUSCULAR | Status: AC
  Administered 2011-08-23: 20000 [IU] via SUBCUTANEOUS
  Filled 2011-08-23: qty 1

## 2011-08-23 MED ORDER — EPOETIN ALFA 10000 UNIT/ML IJ SOLN
INTRAMUSCULAR | Status: AC
  Administered 2011-08-23: 10000 [IU] via SUBCUTANEOUS
  Filled 2011-08-23: qty 1

## 2011-08-25 LAB — POCT HEMOGLOBIN-HEMACUE: Hemoglobin: 10.8 g/dL — ABNORMAL LOW (ref 12.0–15.0)

## 2011-09-12 ENCOUNTER — Other Ambulatory Visit (HOSPITAL_COMMUNITY): Payer: Self-pay | Admitting: *Deleted

## 2011-09-20 ENCOUNTER — Encounter (HOSPITAL_COMMUNITY)
Admission: RE | Admit: 2011-09-20 | Discharge: 2011-09-20 | Disposition: A | Discharge status: Home / Self Care | Payer: Medicare Other | Source: Ambulatory Visit | Attending: Nephrology | Admitting: Nephrology

## 2011-09-20 DIAGNOSIS — D638 Anemia in other chronic diseases classified elsewhere: Secondary | ICD-10-CM | POA: Insufficient documentation

## 2011-09-20 DIAGNOSIS — N183 Chronic kidney disease, stage 3 unspecified: Secondary | ICD-10-CM | POA: Insufficient documentation

## 2011-09-20 LAB — COMPREHENSIVE METABOLIC PANEL
ALT: 8 U/L (ref 0–35)
AST: 16 U/L (ref 0–37)
Albumin: 3.1 g/dL — ABNORMAL LOW (ref 3.5–5.2)
Alkaline Phosphatase: 92 U/L (ref 39–117)
BUN: 20 mg/dL (ref 6–23)
CO2: 31 mEq/L (ref 19–32)
Calcium: 9 mg/dL (ref 8.4–10.5)
Chloride: 103 mEq/L (ref 96–112)
Creatinine, Ser: 1.34 mg/dL — ABNORMAL HIGH (ref 0.50–1.10)
GFR calc Af Amer: 44 mL/min — ABNORMAL LOW (ref >90)
GFR calc non Af Amer: 38 mL/min — ABNORMAL LOW (ref >90)
Glucose, Bld: 196 mg/dL — ABNORMAL HIGH (ref 70–99)
Potassium: 3.8 mEq/L (ref 3.5–5.1)
Sodium: 141 mEq/L (ref 135–145)
Total Bilirubin: 0.2 mg/dL — ABNORMAL LOW (ref 0.3–1.2)
Total Protein: 7.4 g/dL (ref 6.0–8.3)

## 2011-09-20 LAB — IRON AND TIBC
Iron: 59 ug/dL (ref 42–135)
Saturation Ratios: 28 % (ref 20–55)
TIBC: 210 ug/dL — ABNORMAL LOW (ref 250–470)
UIBC: 151 ug/dL (ref 125–400)

## 2011-09-20 LAB — PHOSPHORUS: Phosphorus: 4 mg/dL (ref 2.3–4.6)

## 2011-09-20 LAB — FERRITIN: Ferritin: 467 ng/mL — ABNORMAL HIGH (ref 10–291)

## 2011-09-20 LAB — POCT HEMOGLOBIN-HEMACUE: Hemoglobin: 11.1 g/dL — ABNORMAL LOW (ref 12.0–15.0)

## 2011-09-20 MED ORDER — EPOETIN ALFA 10000 UNIT/ML IJ SOLN
30000.0000 [IU] | INTRAMUSCULAR | Status: DC
  Administered 2011-09-20: 30000 [IU] via SUBCUTANEOUS

## 2011-09-20 MED ORDER — EPOETIN ALFA 20000 UNIT/ML IJ SOLN
INTRAMUSCULAR | Status: AC
  Filled 2011-09-20: qty 1

## 2011-09-20 MED ORDER — EPOETIN ALFA 10000 UNIT/ML IJ SOLN
INTRAMUSCULAR | Status: AC
  Administered 2011-09-20: 30000 [IU] via SUBCUTANEOUS
  Filled 2011-09-20: qty 1

## 2011-09-21 LAB — PTH, INTACT AND CALCIUM
Calcium, Total (PTH): 8.6 mg/dL (ref 8.4–10.5)
PTH: 194.4 pg/mL — ABNORMAL HIGH (ref 14.0–72.0)

## 2011-10-18 ENCOUNTER — Encounter (HOSPITAL_COMMUNITY)
Admission: RE | Admit: 2011-10-18 | Discharge: 2011-10-18 | Disposition: A | Discharge status: Home / Self Care | Payer: Medicare Other | Source: Ambulatory Visit | Attending: Nephrology | Admitting: Nephrology

## 2011-10-18 DIAGNOSIS — D638 Anemia in other chronic diseases classified elsewhere: Secondary | ICD-10-CM | POA: Insufficient documentation

## 2011-10-18 DIAGNOSIS — N183 Chronic kidney disease, stage 3 unspecified: Secondary | ICD-10-CM | POA: Insufficient documentation

## 2011-10-18 LAB — POCT HEMOGLOBIN-HEMACUE: Hemoglobin: 11.3 g/dL — ABNORMAL LOW (ref 12.0–15.0)

## 2011-10-18 MED ORDER — EPOETIN ALFA 20000 UNIT/ML IJ SOLN
INTRAMUSCULAR | Status: AC
  Administered 2011-10-18: 30000 [IU] via SUBCUTANEOUS
  Filled 2011-10-18: qty 1

## 2011-10-18 MED ORDER — EPOETIN ALFA 10000 UNIT/ML IJ SOLN
INTRAMUSCULAR | Status: AC
  Filled 2011-10-18: qty 1

## 2011-10-18 MED ORDER — EPOETIN ALFA 10000 UNIT/ML IJ SOLN: 30000.0000 [IU] | INTRAMUSCULAR | Status: DC

## 2011-10-19 MED FILL — Epoetin Alfa Inj 10000 Unit/ML: INTRAMUSCULAR | Qty: 1 | Status: AC

## 2011-11-15 ENCOUNTER — Encounter (HOSPITAL_COMMUNITY)
Admission: RE | Admit: 2011-11-15 | Discharge: 2011-11-15 | Disposition: A | Discharge status: Home / Self Care | Payer: Medicare Other | Source: Ambulatory Visit | Attending: Nephrology | Admitting: Nephrology

## 2011-11-15 DIAGNOSIS — D638 Anemia in other chronic diseases classified elsewhere: Secondary | ICD-10-CM | POA: Insufficient documentation

## 2011-11-15 DIAGNOSIS — N183 Chronic kidney disease, stage 3 unspecified: Secondary | ICD-10-CM | POA: Insufficient documentation

## 2011-11-15 LAB — POCT HEMOGLOBIN-HEMACUE: Hemoglobin: 10.7 g/dL — ABNORMAL LOW (ref 12.0–15.0)

## 2011-11-15 MED ORDER — EPOETIN ALFA 20000 UNIT/ML IJ SOLN
INTRAMUSCULAR | Status: AC
  Administered 2011-11-15: 20000 [IU] via SUBCUTANEOUS
  Filled 2011-11-15: qty 1

## 2011-11-15 MED ORDER — EPOETIN ALFA 10000 UNIT/ML IJ SOLN
30000.0000 [IU] | INTRAMUSCULAR | Status: DC
  Administered 2011-11-15: 10000 [IU] via SUBCUTANEOUS
  Filled 2011-11-15: qty 1

## 2011-12-12 ENCOUNTER — Other Ambulatory Visit (HOSPITAL_COMMUNITY): Payer: Self-pay | Admitting: *Deleted

## 2011-12-13 ENCOUNTER — Encounter (HOSPITAL_COMMUNITY)
Admission: RE | Admit: 2011-12-13 | Discharge: 2011-12-13 | Disposition: A | Discharge status: Home / Self Care | Payer: Medicare Other | Source: Ambulatory Visit | Attending: Nephrology | Admitting: Nephrology

## 2011-12-13 DIAGNOSIS — N183 Chronic kidney disease, stage 3 unspecified: Secondary | ICD-10-CM | POA: Insufficient documentation

## 2011-12-13 DIAGNOSIS — D638 Anemia in other chronic diseases classified elsewhere: Secondary | ICD-10-CM | POA: Insufficient documentation

## 2011-12-13 LAB — COMPREHENSIVE METABOLIC PANEL
ALT: 9 U/L (ref 0–35)
AST: 18 U/L (ref 0–37)
Albumin: 3 g/dL — ABNORMAL LOW (ref 3.5–5.2)
Alkaline Phosphatase: 85 U/L (ref 39–117)
BUN: 20 mg/dL (ref 6–23)
CO2: 28 mEq/L (ref 19–32)
Calcium: 9 mg/dL (ref 8.4–10.5)
Chloride: 105 mEq/L (ref 96–112)
Creatinine, Ser: 1.32 mg/dL — ABNORMAL HIGH (ref 0.50–1.10)
GFR calc Af Amer: 45 mL/min — ABNORMAL LOW (ref >90)
GFR calc non Af Amer: 39 mL/min — ABNORMAL LOW (ref >90)
Glucose, Bld: 169 mg/dL — ABNORMAL HIGH (ref 70–99)
Potassium: 3.2 mEq/L — ABNORMAL LOW (ref 3.5–5.1)
Sodium: 140 mEq/L (ref 135–145)
Total Bilirubin: 0.4 mg/dL (ref 0.3–1.2)
Total Protein: 7.4 g/dL (ref 6.0–8.3)

## 2011-12-13 LAB — FERRITIN: Ferritin: 451 ng/mL — ABNORMAL HIGH (ref 10–291)

## 2011-12-13 LAB — POCT HEMOGLOBIN-HEMACUE: Hemoglobin: 11 g/dL — ABNORMAL LOW (ref 12.0–15.0)

## 2011-12-13 LAB — IRON AND TIBC
Iron: 73 ug/dL (ref 42–135)
Saturation Ratios: 31 % (ref 20–55)
TIBC: 233 ug/dL — ABNORMAL LOW (ref 250–470)
UIBC: 160 ug/dL (ref 125–400)

## 2011-12-13 LAB — PHOSPHORUS: Phosphorus: 3.8 mg/dL (ref 2.3–4.6)

## 2011-12-13 MED ORDER — EPOETIN ALFA 10000 UNIT/ML IJ SOLN
30000.0000 [IU] | INTRAMUSCULAR | Status: DC
  Administered 2011-12-13: 30000 [IU] via SUBCUTANEOUS

## 2011-12-13 MED ORDER — EPOETIN ALFA 10000 UNIT/ML IJ SOLN
INTRAMUSCULAR | Status: AC
  Administered 2011-12-13: 30000 [IU] via SUBCUTANEOUS
  Filled 2011-12-13: qty 1

## 2011-12-13 MED ORDER — EPOETIN ALFA 20000 UNIT/ML IJ SOLN
INTRAMUSCULAR | Status: AC
  Filled 2011-12-13: qty 1

## 2011-12-14 LAB — PTH, INTACT AND CALCIUM
Calcium, Total (PTH): 9 mg/dL (ref 8.4–10.5)
PTH: 141.7 pg/mL — ABNORMAL HIGH (ref 14.0–72.0)

## 2012-01-10 ENCOUNTER — Encounter (HOSPITAL_COMMUNITY)
Admission: RE | Admit: 2012-01-10 | Discharge: 2012-01-10 | Disposition: A | Discharge status: Home / Self Care | Payer: Medicare Other | Source: Ambulatory Visit | Attending: Nephrology | Admitting: Nephrology

## 2012-01-10 DIAGNOSIS — D638 Anemia in other chronic diseases classified elsewhere: Secondary | ICD-10-CM | POA: Insufficient documentation

## 2012-01-10 DIAGNOSIS — N183 Chronic kidney disease, stage 3 unspecified: Secondary | ICD-10-CM | POA: Insufficient documentation

## 2012-01-10 LAB — POCT HEMOGLOBIN-HEMACUE: Hemoglobin: 11.2 g/dL — ABNORMAL LOW (ref 12.0–15.0)

## 2012-01-10 MED ORDER — EPOETIN ALFA 10000 UNIT/ML IJ SOLN
INTRAMUSCULAR | Status: AC
  Filled 2012-01-10: qty 1

## 2012-01-10 MED ORDER — EPOETIN ALFA 10000 UNIT/ML IJ SOLN
30000.0000 [IU] | INTRAMUSCULAR | Status: DC
  Administered 2012-01-10: 10000 [IU] via SUBCUTANEOUS

## 2012-01-10 MED ORDER — EPOETIN ALFA 20000 UNIT/ML IJ SOLN
INTRAMUSCULAR | Status: AC
  Administered 2012-01-10: 20000 [IU] via SUBCUTANEOUS
  Filled 2012-01-10: qty 1

## 2012-02-07 ENCOUNTER — Encounter (HOSPITAL_COMMUNITY)
Admission: RE | Admit: 2012-02-07 | Discharge: 2012-02-07 | Disposition: A | Discharge status: Home / Self Care | Payer: Medicare Other | Source: Ambulatory Visit | Attending: Nephrology | Admitting: Nephrology

## 2012-02-07 DIAGNOSIS — D638 Anemia in other chronic diseases classified elsewhere: Secondary | ICD-10-CM | POA: Insufficient documentation

## 2012-02-07 DIAGNOSIS — N183 Chronic kidney disease, stage 3 unspecified: Secondary | ICD-10-CM | POA: Insufficient documentation

## 2012-02-07 LAB — POCT HEMOGLOBIN-HEMACUE: Hemoglobin: 10.4 g/dL — ABNORMAL LOW (ref 12.0–15.0)

## 2012-02-07 MED ORDER — EPOETIN ALFA 10000 UNIT/ML IJ SOLN
30000.0000 [IU] | INTRAMUSCULAR | Status: DC
  Administered 2012-02-07: 10000 [IU] via SUBCUTANEOUS
  Filled 2012-02-07: qty 1

## 2012-02-07 MED ORDER — EPOETIN ALFA 20000 UNIT/ML IJ SOLN
INTRAMUSCULAR | Status: AC
  Administered 2012-02-07: 20000 [IU] via SUBCUTANEOUS
  Filled 2012-02-07: qty 1

## 2012-03-05 ENCOUNTER — Other Ambulatory Visit (HOSPITAL_COMMUNITY): Payer: Self-pay | Admitting: *Deleted

## 2012-03-06 ENCOUNTER — Encounter (HOSPITAL_COMMUNITY)
Admission: RE | Admit: 2012-03-06 | Discharge: 2012-03-06 | Disposition: A | Discharge status: Home / Self Care | Payer: Medicare Other | Source: Ambulatory Visit | Attending: Nephrology | Admitting: Nephrology

## 2012-03-06 LAB — COMPREHENSIVE METABOLIC PANEL
ALT: 9 U/L (ref 0–35)
AST: 17 U/L (ref 0–37)
Albumin: 3.1 g/dL — ABNORMAL LOW (ref 3.5–5.2)
Alkaline Phosphatase: 79 U/L (ref 39–117)
BUN: 25 mg/dL — ABNORMAL HIGH (ref 6–23)
CO2: 32 mEq/L (ref 19–32)
Calcium: 9.3 mg/dL (ref 8.4–10.5)
Chloride: 100 mEq/L (ref 96–112)
Creatinine, Ser: 1.6 mg/dL — ABNORMAL HIGH (ref 0.50–1.10)
GFR calc Af Amer: 36 mL/min — ABNORMAL LOW (ref >90)
GFR calc non Af Amer: 31 mL/min — ABNORMAL LOW (ref >90)
Glucose, Bld: 196 mg/dL — ABNORMAL HIGH (ref 70–99)
Potassium: 3.3 mEq/L — ABNORMAL LOW (ref 3.5–5.1)
Sodium: 140 mEq/L (ref 135–145)
Total Bilirubin: 0.2 mg/dL — ABNORMAL LOW (ref 0.3–1.2)
Total Protein: 7.4 g/dL (ref 6.0–8.3)

## 2012-03-06 LAB — PHOSPHORUS: Phosphorus: 4.4 mg/dL (ref 2.3–4.6)

## 2012-03-06 LAB — IRON AND TIBC
Iron: 51 ug/dL (ref 42–135)
Saturation Ratios: 22 % (ref 20–55)
TIBC: 236 ug/dL — ABNORMAL LOW (ref 250–470)
UIBC: 185 ug/dL (ref 125–400)

## 2012-03-06 LAB — POCT HEMOGLOBIN-HEMACUE: Hemoglobin: 10.3 g/dL — ABNORMAL LOW (ref 12.0–15.0)

## 2012-03-06 LAB — FERRITIN: Ferritin: 452 ng/mL — ABNORMAL HIGH (ref 10–291)

## 2012-03-06 MED ORDER — EPOETIN ALFA 10000 UNIT/ML IJ SOLN: 30000.0000 [IU] | INTRAMUSCULAR | Status: DC

## 2012-03-06 MED ORDER — EPOETIN ALFA 10000 UNIT/ML IJ SOLN
INTRAMUSCULAR | Status: AC
  Filled 2012-03-06: qty 1

## 2012-03-06 MED ORDER — EPOETIN ALFA 20000 UNIT/ML IJ SOLN
INTRAMUSCULAR | Status: AC
  Administered 2012-03-06: 30000 [IU]
  Filled 2012-03-06: qty 1

## 2012-03-07 LAB — PTH, INTACT AND CALCIUM
Calcium, Total (PTH): 9.1 mg/dL (ref 8.4–10.5)
PTH: 100.7 pg/mL — ABNORMAL HIGH (ref 14.0–72.0)

## 2012-04-03 ENCOUNTER — Encounter (HOSPITAL_COMMUNITY)
Admission: RE | Admit: 2012-04-03 | Discharge: 2012-04-03 | Disposition: A | Discharge status: Home / Self Care | Payer: PRIVATE HEALTH INSURANCE | Source: Ambulatory Visit | Attending: Nephrology | Admitting: Nephrology

## 2012-04-03 DIAGNOSIS — D638 Anemia in other chronic diseases classified elsewhere: Secondary | ICD-10-CM | POA: Insufficient documentation

## 2012-04-03 DIAGNOSIS — N183 Chronic kidney disease, stage 3 unspecified: Secondary | ICD-10-CM | POA: Insufficient documentation

## 2012-04-03 LAB — POCT HEMOGLOBIN-HEMACUE: Hemoglobin: 11 g/dL — ABNORMAL LOW (ref 12.0–15.0)

## 2012-04-03 MED ORDER — EPOETIN ALFA 10000 UNIT/ML IJ SOLN
30000.0000 [IU] | INTRAMUSCULAR | Status: DC
  Administered 2012-04-03: 30000 [IU] via SUBCUTANEOUS

## 2012-04-03 MED ORDER — EPOETIN ALFA 10000 UNIT/ML IJ SOLN
INTRAMUSCULAR | Status: AC
  Administered 2012-04-03: 30000 [IU] via SUBCUTANEOUS
  Filled 2012-04-03: qty 1

## 2012-04-03 MED ORDER — EPOETIN ALFA 20000 UNIT/ML IJ SOLN
INTRAMUSCULAR | Status: AC
  Filled 2012-04-03: qty 1

## 2012-05-01 ENCOUNTER — Encounter (HOSPITAL_COMMUNITY)
Admission: RE | Admit: 2012-05-01 | Discharge: 2012-05-01 | Disposition: A | Discharge status: Home / Self Care | Payer: Medicare Other | Source: Ambulatory Visit | Attending: Nephrology | Admitting: Nephrology

## 2012-05-01 DIAGNOSIS — D638 Anemia in other chronic diseases classified elsewhere: Secondary | ICD-10-CM | POA: Insufficient documentation

## 2012-05-01 DIAGNOSIS — N183 Chronic kidney disease, stage 3 unspecified: Secondary | ICD-10-CM | POA: Insufficient documentation

## 2012-05-01 LAB — POCT HEMOGLOBIN-HEMACUE: Hemoglobin: 10.6 g/dL — ABNORMAL LOW (ref 12.0–15.0)

## 2012-05-01 MED ORDER — EPOETIN ALFA 20000 UNIT/ML IJ SOLN
INTRAMUSCULAR | Status: AC
  Administered 2012-05-01: 20000 [IU] via SUBCUTANEOUS
  Filled 2012-05-01: qty 1

## 2012-05-01 MED ORDER — EPOETIN ALFA 10000 UNIT/ML IJ SOLN
INTRAMUSCULAR | Status: AC
  Administered 2012-05-01: 10000 [IU] via SUBCUTANEOUS
  Filled 2012-05-01: qty 1

## 2012-05-01 MED ORDER — EPOETIN ALFA 10000 UNIT/ML IJ SOLN
30000.0000 [IU] | INTRAMUSCULAR | Status: DC
  Administered 2012-05-01: 10000 [IU] via SUBCUTANEOUS

## 2012-05-29 ENCOUNTER — Encounter (HOSPITAL_COMMUNITY)
Admission: RE | Admit: 2012-05-29 | Discharge: 2012-05-29 | Disposition: A | Discharge status: Home / Self Care | Payer: PRIVATE HEALTH INSURANCE | Source: Ambulatory Visit | Attending: Nephrology | Admitting: Nephrology

## 2012-05-29 DIAGNOSIS — D638 Anemia in other chronic diseases classified elsewhere: Secondary | ICD-10-CM | POA: Insufficient documentation

## 2012-05-29 DIAGNOSIS — N183 Chronic kidney disease, stage 3 unspecified: Secondary | ICD-10-CM | POA: Insufficient documentation

## 2012-05-29 LAB — COMPREHENSIVE METABOLIC PANEL
ALT: 9 U/L (ref 0–35)
AST: 17 U/L (ref 0–37)
Albumin: 2.9 g/dL — ABNORMAL LOW (ref 3.5–5.2)
Alkaline Phosphatase: 90 U/L (ref 39–117)
BUN: 17 mg/dL (ref 6–23)
CO2: 31 mEq/L (ref 19–32)
Calcium: 9.4 mg/dL (ref 8.4–10.5)
Chloride: 96 mEq/L (ref 96–112)
Creatinine, Ser: 1.55 mg/dL — ABNORMAL HIGH (ref 0.50–1.10)
GFR calc Af Amer: 37 mL/min — ABNORMAL LOW (ref >90)
GFR calc non Af Amer: 32 mL/min — ABNORMAL LOW (ref >90)
Glucose, Bld: 208 mg/dL — ABNORMAL HIGH (ref 70–99)
Potassium: 3.3 mEq/L — ABNORMAL LOW (ref 3.5–5.1)
Sodium: 135 mEq/L (ref 135–145)
Total Bilirubin: 0.3 mg/dL (ref 0.3–1.2)
Total Protein: 7.7 g/dL (ref 6.0–8.3)

## 2012-05-29 LAB — IRON AND TIBC
Iron: 45 ug/dL (ref 42–135)
Saturation Ratios: 19 % — ABNORMAL LOW (ref 20–55)
TIBC: 231 ug/dL — ABNORMAL LOW (ref 250–470)
UIBC: 186 ug/dL (ref 125–400)

## 2012-05-29 LAB — PHOSPHORUS: Phosphorus: 3.5 mg/dL (ref 2.3–4.6)

## 2012-05-29 LAB — FERRITIN: Ferritin: 437 ng/mL — ABNORMAL HIGH (ref 10–291)

## 2012-05-29 LAB — POCT HEMOGLOBIN-HEMACUE: Hemoglobin: 10.7 g/dL — ABNORMAL LOW (ref 12.0–15.0)

## 2012-05-29 MED ORDER — EPOETIN ALFA 10000 UNIT/ML IJ SOLN
30000.0000 [IU] | INTRAMUSCULAR | Status: DC
  Administered 2012-05-29: 30000 [IU] via SUBCUTANEOUS

## 2012-05-29 MED ORDER — EPOETIN ALFA 10000 UNIT/ML IJ SOLN
INTRAMUSCULAR | Status: AC
  Filled 2012-05-29: qty 1

## 2012-05-29 MED ORDER — EPOETIN ALFA 20000 UNIT/ML IJ SOLN
INTRAMUSCULAR | Status: AC
  Filled 2012-05-29: qty 1

## 2012-05-30 LAB — PTH, INTACT AND CALCIUM
Calcium, Total (PTH): 9.3 mg/dL (ref 8.4–10.5)
PTH: 64.2 pg/mL (ref 14.0–72.0)

## 2012-06-25 ENCOUNTER — Other Ambulatory Visit (HOSPITAL_COMMUNITY): Payer: Self-pay | Admitting: *Deleted

## 2012-06-26 ENCOUNTER — Encounter (HOSPITAL_COMMUNITY)
Admission: RE | Admit: 2012-06-26 | Discharge: 2012-06-26 | Disposition: A | Discharge status: Home / Self Care | Payer: Medicare Other | Source: Ambulatory Visit | Attending: Nephrology | Admitting: Nephrology

## 2012-06-26 DIAGNOSIS — D638 Anemia in other chronic diseases classified elsewhere: Secondary | ICD-10-CM | POA: Insufficient documentation

## 2012-06-26 DIAGNOSIS — N183 Chronic kidney disease, stage 3 unspecified: Secondary | ICD-10-CM | POA: Insufficient documentation

## 2012-06-26 LAB — POCT HEMOGLOBIN-HEMACUE: Hemoglobin: 10.6 g/dL — ABNORMAL LOW (ref 12.0–15.0)

## 2012-06-26 MED ORDER — FERUMOXYTOL INJECTION 510 MG/17 ML: 510.0000 mg | INTRAVENOUS | Status: DC

## 2012-06-26 MED ORDER — SODIUM CHLORIDE 0.9 % IV SOLN
INTRAVENOUS | Status: DC
  Administered 2012-06-26: 09:00:00 via INTRAVENOUS

## 2012-06-26 MED ORDER — FERUMOXYTOL INJECTION 510 MG/17 ML
INTRAVENOUS | Status: AC
  Filled 2012-06-26: qty 17

## 2012-06-26 MED ORDER — EPOETIN ALFA 20000 UNIT/ML IJ SOLN
INTRAMUSCULAR | Status: AC
  Administered 2012-06-26: 20000 [IU] via SUBCUTANEOUS
  Filled 2012-06-26: qty 1

## 2012-06-26 MED ORDER — EPOETIN ALFA 10000 UNIT/ML IJ SOLN: 30000.0000 [IU] | INTRAMUSCULAR | Status: DC

## 2012-06-26 MED ORDER — EPOETIN ALFA 10000 UNIT/ML IJ SOLN
INTRAMUSCULAR | Status: AC
  Administered 2012-06-26: 10000 [IU] via SUBCUTANEOUS
  Filled 2012-06-26: qty 1

## 2012-06-29 MED FILL — Ferumoxytol Inj 510 MG/17ML (30 MG/ML) (Elemental Fe): INTRAVENOUS | Qty: 17 | Status: AC

## 2012-07-24 ENCOUNTER — Encounter (HOSPITAL_COMMUNITY)
Admission: RE | Admit: 2012-07-24 | Discharge: 2012-07-24 | Disposition: A | Discharge status: Home / Self Care | Payer: PRIVATE HEALTH INSURANCE | Source: Ambulatory Visit | Attending: Nephrology | Admitting: Nephrology

## 2012-07-24 DIAGNOSIS — D638 Anemia in other chronic diseases classified elsewhere: Secondary | ICD-10-CM | POA: Insufficient documentation

## 2012-07-24 DIAGNOSIS — N183 Chronic kidney disease, stage 3 unspecified: Secondary | ICD-10-CM | POA: Insufficient documentation

## 2012-07-24 LAB — POCT HEMOGLOBIN-HEMACUE: Hemoglobin: 10.9 g/dL — ABNORMAL LOW (ref 12.0–15.0)

## 2012-07-24 LAB — RENAL FUNCTION PANEL
Albumin: 3.2 g/dL — ABNORMAL LOW (ref 3.5–5.2)
BUN: 18 mg/dL (ref 6–23)
CO2: 28 mEq/L (ref 19–32)
Calcium: 8.7 mg/dL (ref 8.4–10.5)
Chloride: 104 mEq/L (ref 96–112)
Creatinine, Ser: 1.49 mg/dL — ABNORMAL HIGH (ref 0.50–1.10)
GFR calc Af Amer: 39 mL/min — ABNORMAL LOW (ref >90)
GFR calc non Af Amer: 33 mL/min — ABNORMAL LOW (ref >90)
Glucose, Bld: 163 mg/dL — ABNORMAL HIGH (ref 70–99)
Phosphorus: 3.1 mg/dL (ref 2.3–4.6)
Potassium: 3.8 mEq/L (ref 3.5–5.1)
Sodium: 141 mEq/L (ref 135–145)

## 2012-07-24 MED ORDER — EPOETIN ALFA 10000 UNIT/ML IJ SOLN: 30000.0000 [IU] | INTRAMUSCULAR | Status: DC

## 2012-07-24 MED ORDER — FERUMOXYTOL INJECTION 510 MG/17 ML
INTRAVENOUS | Status: AC
  Administered 2012-07-24: 510 mg via INTRAVENOUS
  Filled 2012-07-24: qty 17

## 2012-07-24 MED ORDER — SODIUM CHLORIDE 0.9 % IV SOLN
INTRAVENOUS | Status: AC
  Administered 2012-07-24: 09:00:00 via INTRAVENOUS

## 2012-07-24 MED ORDER — EPOETIN ALFA 10000 UNIT/ML IJ SOLN
INTRAMUSCULAR | Status: AC
  Administered 2012-07-24: 10000 [IU] via SUBCUTANEOUS
  Filled 2012-07-24: qty 1

## 2012-07-24 MED ORDER — FERUMOXYTOL INJECTION 510 MG/17 ML
510.0000 mg | INTRAVENOUS | Status: AC
  Administered 2012-07-24: 510 mg via INTRAVENOUS

## 2012-07-24 MED ORDER — EPOETIN ALFA 20000 UNIT/ML IJ SOLN
INTRAMUSCULAR | Status: AC
  Administered 2012-07-24: 20000 [IU] via SUBCUTANEOUS
  Filled 2012-07-24: qty 1

## 2012-07-26 ENCOUNTER — Encounter: Payer: Medicare Other | Attending: Internal Medicine | Admitting: *Deleted

## 2012-07-26 ENCOUNTER — Encounter: Payer: Self-pay | Admitting: *Deleted

## 2012-07-26 VITALS — Ht 60.03 in | Wt 182.3 lb

## 2012-07-26 DIAGNOSIS — Z713 Dietary counseling and surveillance: Secondary | ICD-10-CM | POA: Insufficient documentation

## 2012-07-26 DIAGNOSIS — E119 Type 2 diabetes mellitus without complications: Secondary | ICD-10-CM | POA: Insufficient documentation

## 2012-07-26 NOTE — Progress Notes (Signed)
HbA1c  9.5%   Patient was seen on 07/26/12 for the first of a series of three diabetes self-management courses at the [facility]. The following learning objectives were met by the patient during this course:   Defines the role of glucose and insulin  Identifies type of diabetes and pathophysiology  Defines the diagnostic criteria for diabetes and prediabetes  States the risk factors for Type 2 Diabetes  States the symptoms of Type 2 Diabetes  Defines Type 2 Diabetes treatment goals  Defines Type 2 Diabetes treatment options  States the rationale for glucose monitoring  Identifies A1C, glucose targets, and testing times  Identifies proper sharps disposal  Defines the purpose of a diabetes food plan  Identifies carbohydrate food groups  Defines effects of carbohydrate foods on glucose levels  Identifies carbohydrate choices/grams/food labels  States benefits of physical activity and effect on glucose  Review of suggested activity guidelines  Handouts given during class include:  Type 2 Diabetes: Basics Book  My Food Plan Book  Follow-Up Plan: Attend core 2 and 3

## 2012-07-26 NOTE — Patient Instructions (Signed)
Goals:  Follow Diabetes Meal Plan as instructed  Eat 3 meals and 2 snacks, every 3-5 hrs  Limit carbohydrate intake to 30-45 grams carbohydrate/meal  Limit carbohydrate intake to 15 grams carbohydrate/snack  Add lean protein foods to meals/snacks  Monitor glucose levels as instructed by your doctor  Aim for 30 mins of physical activity daily  Bring food record and glucose log to your next nutrition visit 

## 2012-08-21 ENCOUNTER — Encounter (HOSPITAL_COMMUNITY)
Admission: RE | Admit: 2012-08-21 | Discharge: 2012-08-21 | Disposition: A | Discharge status: Home / Self Care | Payer: Medicare Other | Source: Ambulatory Visit | Attending: Nephrology | Admitting: Nephrology

## 2012-08-21 DIAGNOSIS — D638 Anemia in other chronic diseases classified elsewhere: Secondary | ICD-10-CM | POA: Insufficient documentation

## 2012-08-21 DIAGNOSIS — N183 Chronic kidney disease, stage 3 unspecified: Secondary | ICD-10-CM | POA: Insufficient documentation

## 2012-08-21 LAB — COMPREHENSIVE METABOLIC PANEL
ALT: 9 U/L (ref 0–35)
AST: 17 U/L (ref 0–37)
Albumin: 3.1 g/dL — ABNORMAL LOW (ref 3.5–5.2)
Alkaline Phosphatase: 74 U/L (ref 39–117)
BUN: 22 mg/dL (ref 6–23)
CO2: 28 mEq/L (ref 19–32)
Calcium: 9.4 mg/dL (ref 8.4–10.5)
Chloride: 101 mEq/L (ref 96–112)
Creatinine, Ser: 1.62 mg/dL — ABNORMAL HIGH (ref 0.50–1.10)
GFR calc Af Amer: 35 mL/min — ABNORMAL LOW (ref >90)
GFR calc non Af Amer: 30 mL/min — ABNORMAL LOW (ref >90)
Glucose, Bld: 226 mg/dL — ABNORMAL HIGH (ref 70–99)
Potassium: 3.4 mEq/L — ABNORMAL LOW (ref 3.5–5.1)
Sodium: 139 mEq/L (ref 135–145)
Total Bilirubin: 0.3 mg/dL (ref 0.3–1.2)
Total Protein: 7.5 g/dL (ref 6.0–8.3)

## 2012-08-21 LAB — IRON AND TIBC
Iron: 92 ug/dL (ref 42–135)
Saturation Ratios: 45 % (ref 20–55)
TIBC: 203 ug/dL — ABNORMAL LOW (ref 250–470)
UIBC: 111 ug/dL — ABNORMAL LOW (ref 125–400)

## 2012-08-21 LAB — FERRITIN: Ferritin: 881 ng/mL — ABNORMAL HIGH (ref 10–291)

## 2012-08-21 LAB — PHOSPHORUS: Phosphorus: 3.4 mg/dL (ref 2.3–4.6)

## 2012-08-21 MED ORDER — EPOETIN ALFA 20000 UNIT/ML IJ SOLN
INTRAMUSCULAR | Status: AC
  Administered 2012-08-21: 20000 [IU] via SUBCUTANEOUS
  Filled 2012-08-21: qty 1

## 2012-08-21 MED ORDER — EPOETIN ALFA 10000 UNIT/ML IJ SOLN
30000.0000 [IU] | INTRAMUSCULAR | Status: DC
  Administered 2012-08-21: 10000 [IU] via SUBCUTANEOUS

## 2012-08-21 MED ORDER — EPOETIN ALFA 10000 UNIT/ML IJ SOLN
INTRAMUSCULAR | Status: AC
  Administered 2012-08-21: 10000 [IU] via SUBCUTANEOUS
  Filled 2012-08-21: qty 1

## 2012-08-22 LAB — PTH, INTACT AND CALCIUM
Calcium, Total (PTH): 9 mg/dL (ref 8.4–10.5)
PTH: 93.2 pg/mL — ABNORMAL HIGH (ref 14.0–72.0)

## 2012-08-22 LAB — POCT HEMOGLOBIN-HEMACUE: Hemoglobin: 11 g/dL — ABNORMAL LOW (ref 12.0–15.0)

## 2012-09-13 ENCOUNTER — Encounter: Payer: Medicare Other | Attending: Internal Medicine | Admitting: Dietician

## 2012-09-13 DIAGNOSIS — E119 Type 2 diabetes mellitus without complications: Secondary | ICD-10-CM | POA: Insufficient documentation

## 2012-09-13 DIAGNOSIS — E1129 Type 2 diabetes mellitus with other diabetic kidney complication: Secondary | ICD-10-CM

## 2012-09-13 DIAGNOSIS — Z713 Dietary counseling and surveillance: Secondary | ICD-10-CM | POA: Insufficient documentation

## 2012-09-15 NOTE — Progress Notes (Signed)
  Patient was seen on 09/13/2012 for the second of a series of three diabetes self-management courses at the [facility]. The following learning objectives were met by the patient during this course:   Explain basic nutrition maintenance and quality assurance  Describe causes, symptoms and treatment of hypoglycemia and hyperglycemia  Explain how to manage diabetes during illness  Describe the importance of good nutrition for health and healthy eating strategies  List strategies to follow meal plan when dining out  Describe the effects of alcohol on glucose and how to use it safely  Describe problem solving skills for day-to-day glucose challenges  Describe strategies to use when treatment plan needs to change  Identify important factors involved in successful weight loss  Describe ways to remain physically active  Describe the impact of regular activity on insulin resistance   Handouts given in class:  Refrigerator magnet for Sick Day Guidelines  [facility] Oral medication/insulin handout  Nutritional Strategies for [place] Loss with Diabetes  Solving Glucose Puzzles  Follow-Up Plan: Patient will attend the final class of the ADA Diabetes Self-Care Education.

## 2012-09-17 ENCOUNTER — Other Ambulatory Visit (HOSPITAL_COMMUNITY): Payer: Self-pay | Admitting: *Deleted

## 2012-09-18 ENCOUNTER — Encounter (HOSPITAL_COMMUNITY)
Admission: RE | Admit: 2012-09-18 | Discharge: 2012-09-18 | Disposition: A | Discharge status: Home / Self Care | Payer: Medicare Other | Source: Ambulatory Visit | Attending: Nephrology | Admitting: Nephrology

## 2012-09-18 ENCOUNTER — Encounter (HOSPITAL_COMMUNITY): Payer: Medicare Other

## 2012-09-18 DIAGNOSIS — D638 Anemia in other chronic diseases classified elsewhere: Secondary | ICD-10-CM | POA: Insufficient documentation

## 2012-09-18 DIAGNOSIS — N183 Chronic kidney disease, stage 3 unspecified: Secondary | ICD-10-CM | POA: Insufficient documentation

## 2012-09-18 LAB — POCT HEMOGLOBIN-HEMACUE: Hemoglobin: 11.8 g/dL — ABNORMAL LOW (ref 12.0–15.0)

## 2012-09-18 MED ORDER — EPOETIN ALFA 10000 UNIT/ML IJ SOLN
30000.0000 [IU] | INTRAMUSCULAR | Status: DC
  Administered 2012-09-18: 10000 [IU] via SUBCUTANEOUS

## 2012-09-18 MED ORDER — EPOETIN ALFA 10000 UNIT/ML IJ SOLN
INTRAMUSCULAR | Status: AC
  Administered 2012-09-18: 10000 [IU] via SUBCUTANEOUS
  Filled 2012-09-18: qty 1

## 2012-09-18 MED ORDER — EPOETIN ALFA 20000 UNIT/ML IJ SOLN
INTRAMUSCULAR | Status: AC
  Administered 2012-09-18: 20000 [IU] via SUBCUTANEOUS
  Filled 2012-09-18: qty 1

## 2012-10-03 ENCOUNTER — Encounter: Payer: Medicare Other | Admitting: Dietician

## 2012-10-03 DIAGNOSIS — E119 Type 2 diabetes mellitus without complications: Secondary | ICD-10-CM

## 2012-10-03 NOTE — Progress Notes (Signed)
  Medical Nutrition Therapy:  Appt start time: 1000 end time:  1030.  Assessment:  Primary concerns today: Comes today for assistance with regulating her blood glucose using diet and Levemir insulin.  Currently she is at 35 units of Levemir which she takes in the morning.  She is using the flex pen which helps with her maintaining a more accurate dose.  She tells me that Dr. Katy Fitch says she is legally blind due to her cataracts.  She is posted for cataract surgery the last of March.   In reviewing her blood glucose levels, Fasting is usually 105 -178 (Depends on what I eat).        After lunch: 135-150  (from her report, she is testing right after the meal and missing the height of the                                                                                               glucose curve.      After dinner/bedtime:  About 300 or so. Request 1. She test 2 hours after the first bite of each meal.     2. Test before her bedtime snack.     3. Record her glucose levels and take to all appointments.     4. Take her Levemir at the same time each day.     5.  Read her food labels and count out her snacks.    6. Keep the snack at 15 gm of Carbohydrate.    7. Have a protein with her snacks     8. Plan to attend Core Class 3 on Thursday March 20th at 9:00 AM-11:00 AM.    9. Plan to use your free follow-up visit before Jan 05, 2013.       MEDICATIONS: Diabetes medication is the Levemir Flex Pen at 35 units daily in the AM.   Recent physical activity: No planned activity due to a fall resulting in a sore back.   Progress Towards Goal(s):  In progress.   Nutritional Diagnosis:  [place]-2.1 Inpaired nutrition utilization As related to glucose.  As evidenced by history of type 2 diabetes with increased blood glucose levesl and an A1C at 9% or greater..    Intervention:  Nutrition Review of the dietary recommendations from class regarding label reading and carb portions and counting of carbohydrates.   Encouraged her to keep the rice, past, potatoes to 1/2 cup at a meal, increase her serving of non-starchy vegetables, and limit her frying and fried foods.  Needs to have a protein with her snacks.  Husband is her today, even though her does not read, he does know his numbers and is wiling to help her with the food labels and recording her blood glucose.  Handouts given during visit include:  Food label with recommendations  Blood glucose log  Monitoring/Evaluation:  Dietary intake, exercise, blood glucose levels, and body weight Following next class and before May 31.Marland Kitchen

## 2012-10-15 ENCOUNTER — Other Ambulatory Visit (HOSPITAL_COMMUNITY): Payer: Self-pay | Admitting: *Deleted

## 2012-10-16 ENCOUNTER — Encounter (HOSPITAL_COMMUNITY): Payer: Medicare Other

## 2012-10-25 ENCOUNTER — Encounter: Payer: Medicare Other | Attending: Internal Medicine

## 2012-10-25 DIAGNOSIS — E119 Type 2 diabetes mellitus without complications: Secondary | ICD-10-CM | POA: Insufficient documentation

## 2012-10-25 DIAGNOSIS — Z713 Dietary counseling and surveillance: Secondary | ICD-10-CM | POA: Insufficient documentation

## 2012-12-04 ENCOUNTER — Encounter (HOSPITAL_COMMUNITY)
Admission: RE | Admit: 2012-12-04 | Discharge: 2012-12-04 | Disposition: A | Discharge status: Home / Self Care | Payer: Medicare Other | Source: Ambulatory Visit | Attending: Nephrology | Admitting: Nephrology

## 2012-12-04 DIAGNOSIS — N183 Chronic kidney disease, stage 3 unspecified: Secondary | ICD-10-CM | POA: Insufficient documentation

## 2012-12-04 DIAGNOSIS — D638 Anemia in other chronic diseases classified elsewhere: Secondary | ICD-10-CM | POA: Insufficient documentation

## 2012-12-04 LAB — RENAL FUNCTION PANEL
Albumin: 3.1 g/dL — ABNORMAL LOW (ref 3.5–5.2)
BUN: 21 mg/dL (ref 6–23)
CO2: 28 mEq/L (ref 19–32)
Calcium: 8.7 mg/dL (ref 8.4–10.5)
Chloride: 101 mEq/L (ref 96–112)
Creatinine, Ser: 1.61 mg/dL — ABNORMAL HIGH (ref 0.50–1.10)
GFR calc Af Amer: 35 mL/min — ABNORMAL LOW (ref >90)
GFR calc non Af Amer: 30 mL/min — ABNORMAL LOW (ref >90)
Glucose, Bld: 185 mg/dL — ABNORMAL HIGH (ref 70–99)
Phosphorus: 2.8 mg/dL (ref 2.3–4.6)
Potassium: 3.3 mEq/L — ABNORMAL LOW (ref 3.5–5.1)
Sodium: 137 mEq/L (ref 135–145)

## 2012-12-04 LAB — POCT HEMOGLOBIN-HEMACUE: Hemoglobin: 10.6 g/dL — ABNORMAL LOW (ref 12.0–15.0)

## 2012-12-04 LAB — IRON AND TIBC
Iron: 68 ug/dL (ref 42–135)
Saturation Ratios: 27 % (ref 20–55)
TIBC: 249 ug/dL — ABNORMAL LOW (ref 250–470)
UIBC: 181 ug/dL (ref 125–400)

## 2012-12-04 LAB — FERRITIN: Ferritin: 812 ng/mL — ABNORMAL HIGH (ref 10–291)

## 2012-12-04 MED ORDER — EPOETIN ALFA 10000 UNIT/ML IJ SOLN
INTRAMUSCULAR | Status: AC
  Administered 2012-12-04: 10000 [IU]
  Filled 2012-12-04: qty 1

## 2012-12-04 MED ORDER — EPOETIN ALFA 10000 UNIT/ML IJ SOLN: 30000.0000 [IU] | INTRAMUSCULAR | Status: DC

## 2012-12-04 MED ORDER — EPOETIN ALFA 20000 UNIT/ML IJ SOLN
INTRAMUSCULAR | Status: AC
  Administered 2012-12-04: 20000 [IU]
  Filled 2012-12-04: qty 1

## 2012-12-05 LAB — PTH, INTACT AND CALCIUM
Calcium, Total (PTH): 8.7 mg/dL (ref 8.4–10.5)
PTH: 135.5 pg/mL — ABNORMAL HIGH (ref 14.0–72.0)

## 2013-01-14 ENCOUNTER — Other Ambulatory Visit (HOSPITAL_COMMUNITY): Payer: Self-pay | Admitting: *Deleted

## 2013-01-15 ENCOUNTER — Encounter (HOSPITAL_COMMUNITY)
Admission: RE | Admit: 2013-01-15 | Discharge: 2013-01-15 | Disposition: A | Discharge status: Home / Self Care | Payer: Medicare Other | Source: Ambulatory Visit | Attending: Nephrology | Admitting: Nephrology

## 2013-01-15 DIAGNOSIS — D638 Anemia in other chronic diseases classified elsewhere: Secondary | ICD-10-CM | POA: Insufficient documentation

## 2013-01-15 DIAGNOSIS — N183 Chronic kidney disease, stage 3 unspecified: Secondary | ICD-10-CM | POA: Insufficient documentation

## 2013-01-15 LAB — POCT HEMOGLOBIN-HEMACUE: Hemoglobin: 10.6 g/dL — ABNORMAL LOW (ref 12.0–15.0)

## 2013-01-15 MED ORDER — EPOETIN ALFA 20000 UNIT/ML IJ SOLN
INTRAMUSCULAR | Status: AC
  Administered 2013-01-15: 20000 [IU] via SUBCUTANEOUS
  Filled 2013-01-15: qty 1

## 2013-01-15 MED ORDER — EPOETIN ALFA 10000 UNIT/ML IJ SOLN: 30000.0000 [IU] | INTRAMUSCULAR | Status: DC

## 2013-01-15 MED ORDER — EPOETIN ALFA 10000 UNIT/ML IJ SOLN
INTRAMUSCULAR | Status: AC
  Administered 2013-01-15: 10000 [IU] via SUBCUTANEOUS
  Filled 2013-01-15: qty 1

## 2013-02-26 ENCOUNTER — Encounter (HOSPITAL_COMMUNITY)
Admission: RE | Admit: 2013-02-26 | Discharge: 2013-02-26 | Disposition: A | Discharge status: Home / Self Care | Payer: Medicare Other | Source: Ambulatory Visit | Attending: Nephrology | Admitting: Nephrology

## 2013-02-26 DIAGNOSIS — N183 Chronic kidney disease, stage 3 unspecified: Secondary | ICD-10-CM | POA: Insufficient documentation

## 2013-02-26 DIAGNOSIS — D638 Anemia in other chronic diseases classified elsewhere: Secondary | ICD-10-CM | POA: Insufficient documentation

## 2013-02-26 LAB — RENAL FUNCTION PANEL
Albumin: 3.2 g/dL — ABNORMAL LOW (ref 3.5–5.2)
BUN: 24 mg/dL — ABNORMAL HIGH (ref 6–23)
CO2: 28 mEq/L (ref 19–32)
Calcium: 9.4 mg/dL (ref 8.4–10.5)
Chloride: 100 mEq/L (ref 96–112)
Creatinine, Ser: 1.99 mg/dL — ABNORMAL HIGH (ref 0.50–1.10)
GFR calc Af Amer: 27 mL/min — ABNORMAL LOW (ref >90)
GFR calc non Af Amer: 23 mL/min — ABNORMAL LOW (ref >90)
Glucose, Bld: 184 mg/dL — ABNORMAL HIGH (ref 70–99)
Phosphorus: 4 mg/dL (ref 2.3–4.6)
Potassium: 3.8 mEq/L (ref 3.5–5.1)
Sodium: 137 mEq/L (ref 135–145)

## 2013-02-26 LAB — POCT HEMOGLOBIN-HEMACUE: Hemoglobin: 10.9 g/dL — ABNORMAL LOW (ref 12.0–15.0)

## 2013-02-26 LAB — IRON AND TIBC
Iron: 80 ug/dL (ref 42–135)
Saturation Ratios: 33 % (ref 20–55)
TIBC: 240 ug/dL — ABNORMAL LOW (ref 250–470)
UIBC: 160 ug/dL (ref 125–400)

## 2013-02-26 LAB — FERRITIN: Ferritin: 696 ng/mL — ABNORMAL HIGH (ref 10–291)

## 2013-02-26 MED ORDER — EPOETIN ALFA 10000 UNIT/ML IJ SOLN
INTRAMUSCULAR | Status: AC
  Administered 2013-02-26: 10000 [IU] via SUBCUTANEOUS
  Filled 2013-02-26: qty 1

## 2013-02-26 MED ORDER — EPOETIN ALFA 20000 UNIT/ML IJ SOLN
INTRAMUSCULAR | Status: AC
  Administered 2013-02-26: 20000 [IU] via SUBCUTANEOUS
  Filled 2013-02-26: qty 1

## 2013-02-26 MED ORDER — EPOETIN ALFA 10000 UNIT/ML IJ SOLN: 30000.0000 [IU] | INTRAMUSCULAR | Status: DC

## 2013-02-27 LAB — PTH, INTACT AND CALCIUM
Calcium, Total (PTH): 9.3 mg/dL (ref 8.4–10.5)
PTH: 118.2 pg/mL — ABNORMAL HIGH (ref 14.0–72.0)

## 2013-04-05 ENCOUNTER — Other Ambulatory Visit (HOSPITAL_COMMUNITY): Payer: Self-pay | Admitting: *Deleted

## 2013-04-09 ENCOUNTER — Encounter (HOSPITAL_COMMUNITY)
Admission: RE | Admit: 2013-04-09 | Discharge: 2013-04-09 | Disposition: A | Discharge status: Home / Self Care | Payer: Medicare Other | Source: Ambulatory Visit | Attending: Nephrology | Admitting: Nephrology

## 2013-04-09 DIAGNOSIS — N183 Chronic kidney disease, stage 3 unspecified: Secondary | ICD-10-CM | POA: Insufficient documentation

## 2013-04-09 DIAGNOSIS — D638 Anemia in other chronic diseases classified elsewhere: Secondary | ICD-10-CM | POA: Insufficient documentation

## 2013-04-09 MED ORDER — EPOETIN ALFA 10000 UNIT/ML IJ SOLN: 30000.0000 [IU] | INTRAMUSCULAR | Status: DC

## 2013-04-17 ENCOUNTER — Encounter (HOSPITAL_COMMUNITY)
Admission: RE | Admit: 2013-04-17 | Discharge: 2013-04-17 | Disposition: A | Discharge status: Home / Self Care | Payer: Medicare Other | Source: Ambulatory Visit | Attending: Nephrology | Admitting: Nephrology

## 2013-04-17 MED ORDER — EPOETIN ALFA 10000 UNIT/ML IJ SOLN
30000.0000 [IU] | INTRAMUSCULAR | Status: DC
  Administered 2013-04-17: 10000 [IU] via SUBCUTANEOUS

## 2013-04-17 MED ORDER — EPOETIN ALFA 10000 UNIT/ML IJ SOLN
INTRAMUSCULAR | Status: AC
  Filled 2013-04-17: qty 1

## 2013-04-17 MED ORDER — EPOETIN ALFA 20000 UNIT/ML IJ SOLN
INTRAMUSCULAR | Status: AC
  Administered 2013-04-17: 20000 [IU]
  Filled 2013-04-17: qty 1

## 2013-05-29 ENCOUNTER — Encounter (HOSPITAL_COMMUNITY)
Admission: RE | Admit: 2013-05-29 | Discharge: 2013-05-29 | Disposition: A | Discharge status: Home / Self Care | Payer: Medicare Other | Source: Ambulatory Visit | Attending: Nephrology | Admitting: Nephrology

## 2013-05-29 DIAGNOSIS — N183 Chronic kidney disease, stage 3 unspecified: Secondary | ICD-10-CM | POA: Insufficient documentation

## 2013-05-29 DIAGNOSIS — D638 Anemia in other chronic diseases classified elsewhere: Secondary | ICD-10-CM | POA: Insufficient documentation

## 2013-05-29 LAB — RENAL FUNCTION PANEL
Albumin: 3.2 g/dL — ABNORMAL LOW (ref 3.5–5.2)
BUN: 17 mg/dL (ref 6–23)
CO2: 29 mEq/L (ref 19–32)
Calcium: 8.9 mg/dL (ref 8.4–10.5)
Chloride: 103 mEq/L (ref 96–112)
Creatinine, Ser: 1.65 mg/dL — ABNORMAL HIGH (ref 0.50–1.10)
GFR calc Af Amer: 34 mL/min — ABNORMAL LOW (ref >90)
GFR calc non Af Amer: 29 mL/min — ABNORMAL LOW (ref >90)
Glucose, Bld: 164 mg/dL — ABNORMAL HIGH (ref 70–99)
Phosphorus: 3.2 mg/dL (ref 2.3–4.6)
Potassium: 3.7 mEq/L (ref 3.5–5.1)
Sodium: 141 mEq/L (ref 135–145)

## 2013-05-29 LAB — FERRITIN: Ferritin: 751 ng/mL — ABNORMAL HIGH (ref 10–291)

## 2013-05-29 LAB — IRON AND TIBC
Iron: 64 ug/dL (ref 42–135)
Saturation Ratios: 32 % (ref 20–55)
TIBC: 203 ug/dL — ABNORMAL LOW (ref 250–470)
UIBC: 139 ug/dL (ref 125–400)

## 2013-05-29 LAB — POCT HEMOGLOBIN-HEMACUE: Hemoglobin: 10.9 g/dL — ABNORMAL LOW (ref 12.0–15.0)

## 2013-05-29 MED ORDER — EPOETIN ALFA 10000 UNIT/ML IJ SOLN
INTRAMUSCULAR | Status: AC
  Administered 2013-05-29: 10000 [IU] via SUBCUTANEOUS
  Filled 2013-05-29: qty 1

## 2013-05-29 MED ORDER — EPOETIN ALFA 10000 UNIT/ML IJ SOLN: 30000.0000 [IU] | INTRAMUSCULAR | Status: DC

## 2013-05-29 MED ORDER — EPOETIN ALFA 20000 UNIT/ML IJ SOLN
INTRAMUSCULAR | Status: AC
  Administered 2013-05-29: 20000 [IU] via SUBCUTANEOUS
  Filled 2013-05-29: qty 1

## 2013-05-30 LAB — PTH, INTACT AND CALCIUM
Calcium, Total (PTH): 9.1 mg/dL (ref 8.4–10.5)
PTH: 147.9 pg/mL — ABNORMAL HIGH (ref 14.0–72.0)

## 2013-07-09 ENCOUNTER — Other Ambulatory Visit (HOSPITAL_COMMUNITY): Payer: Self-pay | Admitting: *Deleted

## 2013-07-10 ENCOUNTER — Encounter (HOSPITAL_COMMUNITY)
Admission: RE | Admit: 2013-07-10 | Discharge: 2013-07-10 | Disposition: A | Discharge status: Home / Self Care | Payer: Medicare Other | Source: Ambulatory Visit | Attending: Nephrology | Admitting: Nephrology

## 2013-07-10 DIAGNOSIS — N183 Chronic kidney disease, stage 3 unspecified: Secondary | ICD-10-CM | POA: Insufficient documentation

## 2013-07-10 DIAGNOSIS — D638 Anemia in other chronic diseases classified elsewhere: Secondary | ICD-10-CM | POA: Insufficient documentation

## 2013-07-10 LAB — POCT HEMOGLOBIN-HEMACUE: Hemoglobin: 10.3 g/dL — ABNORMAL LOW (ref 12.0–15.0)

## 2013-07-10 MED ORDER — EPOETIN ALFA 10000 UNIT/ML IJ SOLN
INTRAMUSCULAR | Status: AC
  Administered 2013-07-10: 10000 [IU] via SUBCUTANEOUS
  Filled 2013-07-10: qty 1

## 2013-07-10 MED ORDER — EPOETIN ALFA 10000 UNIT/ML IJ SOLN: 30000.0000 [IU] | INTRAMUSCULAR | Status: DC

## 2013-07-10 MED ORDER — EPOETIN ALFA 20000 UNIT/ML IJ SOLN
INTRAMUSCULAR | Status: AC
  Administered 2013-07-10: 20000 [IU] via SUBCUTANEOUS
  Filled 2013-07-10: qty 1

## 2013-08-08 DIAGNOSIS — G459 Transient cerebral ischemic attack, unspecified: Secondary | ICD-10-CM

## 2013-08-08 HISTORY — DX: Transient cerebral ischemic attack, unspecified: G45.9

## 2013-08-21 ENCOUNTER — Encounter (HOSPITAL_COMMUNITY): Payer: Medicare Other

## 2013-09-04 ENCOUNTER — Encounter (HOSPITAL_COMMUNITY)
Admission: RE | Admit: 2013-09-04 | Discharge: 2013-09-04 | Disposition: A | Discharge status: Home / Self Care | Payer: Medicare Other | Source: Ambulatory Visit | Attending: Nephrology | Admitting: Nephrology

## 2013-09-04 DIAGNOSIS — N183 Chronic kidney disease, stage 3 unspecified: Secondary | ICD-10-CM | POA: Insufficient documentation

## 2013-09-04 DIAGNOSIS — D638 Anemia in other chronic diseases classified elsewhere: Secondary | ICD-10-CM | POA: Insufficient documentation

## 2013-09-04 LAB — RENAL FUNCTION PANEL
Albumin: 2.9 g/dL — ABNORMAL LOW (ref 3.5–5.2)
BUN: 26 mg/dL — ABNORMAL HIGH (ref 6–23)
CO2: 28 mEq/L (ref 19–32)
Calcium: 8.6 mg/dL (ref 8.4–10.5)
Chloride: 104 mEq/L (ref 96–112)
Creatinine, Ser: 1.95 mg/dL — ABNORMAL HIGH (ref 0.50–1.10)
GFR calc Af Amer: 28 mL/min — ABNORMAL LOW (ref >90)
GFR calc non Af Amer: 24 mL/min — ABNORMAL LOW (ref >90)
Glucose, Bld: 131 mg/dL — ABNORMAL HIGH (ref 70–99)
Phosphorus: 3.8 mg/dL (ref 2.3–4.6)
Potassium: 3.6 mEq/L — ABNORMAL LOW (ref 3.7–5.3)
Sodium: 143 mEq/L (ref 137–147)

## 2013-09-04 LAB — IRON AND TIBC
Iron: 57 ug/dL (ref 42–135)
Saturation Ratios: 26 % (ref 20–55)
TIBC: 218 ug/dL — ABNORMAL LOW (ref 250–470)
UIBC: 161 ug/dL (ref 125–400)

## 2013-09-04 LAB — POCT HEMOGLOBIN-HEMACUE: Hemoglobin: 10 g/dL — ABNORMAL LOW (ref 12.0–15.0)

## 2013-09-04 LAB — FERRITIN: Ferritin: 772 ng/mL — ABNORMAL HIGH (ref 10–291)

## 2013-09-04 MED ORDER — EPOETIN ALFA 10000 UNIT/ML IJ SOLN
30000.0000 [IU] | INTRAMUSCULAR | Status: DC
  Administered 2013-09-04: 30000 [IU] via SUBCUTANEOUS

## 2013-09-04 MED ORDER — EPOETIN ALFA 20000 UNIT/ML IJ SOLN
INTRAMUSCULAR | Status: AC
  Filled 2013-09-04: qty 1

## 2013-09-04 MED ORDER — EPOETIN ALFA 10000 UNIT/ML IJ SOLN
INTRAMUSCULAR | Status: AC
  Filled 2013-09-04: qty 1

## 2013-09-05 LAB — PTH, INTACT AND CALCIUM
Calcium, Total (PTH): 8.8 mg/dL (ref 8.4–10.5)
PTH: 154.3 pg/mL — ABNORMAL HIGH (ref 14.0–72.0)

## 2013-09-05 MED FILL — Epoetin Alfa Inj 20000 Unit/ML: INTRAMUSCULAR | Qty: 1 | Status: AC

## 2013-09-05 MED FILL — Epoetin Alfa Inj 10000 Unit/ML: INTRAMUSCULAR | Qty: 1 | Status: AC

## 2013-10-16 ENCOUNTER — Encounter (HOSPITAL_COMMUNITY)
Admission: RE | Admit: 2013-10-16 | Discharge: 2013-10-16 | Disposition: A | Discharge status: Home / Self Care | Payer: Medicare Other | Source: Ambulatory Visit | Attending: Nephrology | Admitting: Nephrology

## 2013-10-16 DIAGNOSIS — N183 Chronic kidney disease, stage 3 unspecified: Secondary | ICD-10-CM | POA: Insufficient documentation

## 2013-10-16 DIAGNOSIS — D638 Anemia in other chronic diseases classified elsewhere: Secondary | ICD-10-CM | POA: Insufficient documentation

## 2013-10-16 LAB — POCT HEMOGLOBIN-HEMACUE: Hemoglobin: 9.9 g/dL — ABNORMAL LOW (ref 12.0–15.0)

## 2013-10-16 MED ORDER — EPOETIN ALFA 10000 UNIT/ML IJ SOLN: 30000.0000 [IU] | INTRAMUSCULAR | Status: DC

## 2013-10-16 MED ORDER — EPOETIN ALFA 10000 UNIT/ML IJ SOLN
INTRAMUSCULAR | Status: AC
  Administered 2013-10-16: 30000 [IU] via SUBCUTANEOUS
  Filled 2013-10-16: qty 1

## 2013-10-16 MED ORDER — EPOETIN ALFA 2000 UNIT/ML IJ SOLN
INTRAMUSCULAR | Status: AC
  Filled 2013-10-16: qty 1

## 2013-10-17 MED FILL — Epoetin Alfa Inj 10000 Unit/ML: INTRAMUSCULAR | Qty: 1 | Status: AC

## 2013-10-17 MED FILL — Epoetin Alfa Inj 20000 Unit/ML: INTRAMUSCULAR | Qty: 1 | Status: AC

## 2013-11-27 ENCOUNTER — Encounter (HOSPITAL_COMMUNITY)
Admission: RE | Admit: 2013-11-27 | Discharge: 2013-11-27 | Disposition: A | Discharge status: Home / Self Care | Payer: Medicare Other | Source: Ambulatory Visit | Attending: Nephrology | Admitting: Nephrology

## 2013-11-27 DIAGNOSIS — D638 Anemia in other chronic diseases classified elsewhere: Secondary | ICD-10-CM | POA: Insufficient documentation

## 2013-11-27 DIAGNOSIS — N183 Chronic kidney disease, stage 3 unspecified: Secondary | ICD-10-CM | POA: Insufficient documentation

## 2013-11-27 LAB — RENAL FUNCTION PANEL
Albumin: 3 g/dL — ABNORMAL LOW (ref 3.5–5.2)
BUN: 27 mg/dL — ABNORMAL HIGH (ref 6–23)
CO2: 27 mEq/L (ref 19–32)
Calcium: 9 mg/dL (ref 8.4–10.5)
Chloride: 101 mEq/L (ref 96–112)
Creatinine, Ser: 2.01 mg/dL — ABNORMAL HIGH (ref 0.50–1.10)
GFR calc Af Amer: 27 mL/min — ABNORMAL LOW (ref >90)
GFR calc non Af Amer: 23 mL/min — ABNORMAL LOW (ref >90)
Glucose, Bld: 176 mg/dL — ABNORMAL HIGH (ref 70–99)
Phosphorus: 4 mg/dL (ref 2.3–4.6)
Potassium: 4.3 mEq/L (ref 3.7–5.3)
Sodium: 141 mEq/L (ref 137–147)

## 2013-11-27 LAB — POCT HEMOGLOBIN-HEMACUE: Hemoglobin: 10.6 g/dL — ABNORMAL LOW (ref 12.0–15.0)

## 2013-11-27 LAB — IRON AND TIBC
Iron: 60 ug/dL (ref 42–135)
Saturation Ratios: 25 % (ref 20–55)
TIBC: 243 ug/dL — ABNORMAL LOW (ref 250–470)
UIBC: 183 ug/dL (ref 125–400)

## 2013-11-27 LAB — FERRITIN: Ferritin: 792 ng/mL — ABNORMAL HIGH (ref 10–291)

## 2013-11-27 MED ORDER — EPOETIN ALFA 10000 UNIT/ML IJ SOLN
INTRAMUSCULAR | Status: AC
  Administered 2013-11-27: 10000 [IU] via SUBCUTANEOUS
  Filled 2013-11-27: qty 1

## 2013-11-27 MED ORDER — EPOETIN ALFA 10000 UNIT/ML IJ SOLN: 30000.0000 [IU] | INTRAMUSCULAR | Status: DC

## 2013-11-27 MED ORDER — EPOETIN ALFA 20000 UNIT/ML IJ SOLN
INTRAMUSCULAR | Status: AC
  Administered 2013-11-27: 20000 [IU] via SUBCUTANEOUS
  Filled 2013-11-27: qty 1

## 2013-11-28 LAB — PTH, INTACT AND CALCIUM
Calcium, Total (PTH): 8.7 mg/dL (ref 8.4–10.5)
PTH: 294 pg/mL — ABNORMAL HIGH (ref 14.0–72.0)

## 2014-01-08 ENCOUNTER — Encounter (HOSPITAL_COMMUNITY)
Admission: RE | Admit: 2014-01-08 | Discharge: 2014-01-08 | Disposition: A | Discharge status: Home / Self Care | Payer: Medicare Other | Source: Ambulatory Visit | Attending: Nephrology | Admitting: Nephrology

## 2014-01-08 ENCOUNTER — Emergency Department (HOSPITAL_COMMUNITY)
Admission: EM | Admit: 2014-01-08 | Discharge: 2014-01-08 | Disposition: A | Discharge status: Home / Self Care | Payer: Medicare Other | Attending: Emergency Medicine | Admitting: Emergency Medicine

## 2014-01-08 ENCOUNTER — Encounter (HOSPITAL_COMMUNITY): Payer: Self-pay | Admitting: Emergency Medicine

## 2014-01-08 DIAGNOSIS — I10 Essential (primary) hypertension: Secondary | ICD-10-CM | POA: Diagnosis not present

## 2014-01-08 DIAGNOSIS — E785 Hyperlipidemia, unspecified: Secondary | ICD-10-CM | POA: Diagnosis not present

## 2014-01-08 DIAGNOSIS — Z880 Allergy status to penicillin: Secondary | ICD-10-CM | POA: Diagnosis not present

## 2014-01-08 DIAGNOSIS — Z79899 Other long term (current) drug therapy: Secondary | ICD-10-CM | POA: Diagnosis not present

## 2014-01-08 DIAGNOSIS — N183 Chronic kidney disease, stage 3 unspecified: Secondary | ICD-10-CM | POA: Insufficient documentation

## 2014-01-08 DIAGNOSIS — E119 Type 2 diabetes mellitus without complications: Secondary | ICD-10-CM | POA: Diagnosis not present

## 2014-01-08 DIAGNOSIS — Z794 Long term (current) use of insulin: Secondary | ICD-10-CM | POA: Diagnosis not present

## 2014-01-08 DIAGNOSIS — D638 Anemia in other chronic diseases classified elsewhere: Secondary | ICD-10-CM | POA: Diagnosis present

## 2014-01-08 MED ORDER — CLONIDINE HCL 0.1 MG PO TABS
ORAL_TABLET | ORAL | Status: AC
  Administered 2014-01-08: 0.1 mg
  Filled 2014-01-08: qty 1

## 2014-01-08 MED ORDER — EPOETIN ALFA 10000 UNIT/ML IJ SOLN: 30000.0000 [IU] | INTRAMUSCULAR | Status: DC

## 2014-01-08 MED ORDER — CLONIDINE HCL 0.1 MG PO TABS: 0.1000 mg | ORAL_TABLET | Freq: Once | ORAL | Status: DC | PRN

## 2014-01-08 NOTE — ED Notes (Signed)
Pt came to day surgery for iv infusion and was sent here due to htn, bp 227/105. Pt has no complaints.

## 2014-01-08 NOTE — ED Provider Notes (Signed)
CSN: [id number]     Arrival date & time 01/08/14  1105 History   First MD Initiated Contact with Patient 01/08/14 1123     Chief Complaint  Patient presents with  . Hypertension     (Consider location/radiation/quality/duration/timing/severity/associated sxs/prior Treatment) Patient is a 78 y.o. female presenting with hypertension. The history is provided by the patient.  Hypertension Pertinent negatives include no chest pain, no abdominal pain, no headaches and no shortness of breath.   patient sent from a day surgery where she was scheduled to get her regular IM injection. Patient states is not an IV injection. Patient's blood pressure was noted to be 227/105. Patient completely asymptomatic. Day surgery Center here for blood pressure control. Blood pressure there was 227/105. They gave her 0.1 mg of clonidine. Patient RE taken her morning blood pressure meds. Patient states she feels completely fine. Patient's blood pressure is controlled by Dr. Baird Cancer.  Past Medical History  Diagnosis Date  . Diabetes mellitus without complication   . Hyperlipidemia   . Hypertension    History reviewed. No pertinent past surgical history. Family History  Problem Relation Age of Onset  . Hypertension Other   . Hyperlipidemia Other   . Stroke Other    History  Substance Use Topics  . Smoking status: Never Smoker   . Smokeless tobacco: Not on file  . Alcohol Use: Not on file   OB History   Grav Para Term Preterm Abortions TAB SAB Ect Mult Living                 Review of Systems  Constitutional: Negative for fever.  HENT: Negative for congestion.   Eyes: Negative for visual disturbance.  Respiratory: Negative for chest tightness and shortness of breath.   Cardiovascular: Negative for chest pain.  Gastrointestinal: Negative for nausea, vomiting and abdominal pain.  Genitourinary: Negative for dysuria.  Musculoskeletal: Negative for back pain.  Skin: Negative for rash.  Neurological:  Negative for syncope, facial asymmetry, speech difficulty, weakness, numbness and headaches.  Hematological: Does not bruise/bleed easily.  Psychiatric/Behavioral: Negative for confusion.      Allergies  Penicillins and Bayer advanced aspirin  Home Medications   Prior to Admission medications   Medication Sig Start Date End Date Taking? Authorizing Provider  LEVEMIR FLEXPEN 100 UNIT/ML injection Inject 22-25 Units into the skin 2 (two) times daily.  07/12/12  Yes Historical Provider, MD  calcitRIOL (ROCALTROL) 0.25 MCG capsule  06/26/12   Historical Provider, MD  cloNIDine (CATAPRES) 0.1 MG tablet  07/16/12   Historical Provider, MD  furosemide (LASIX) 40 MG tablet  06/26/12   Historical Provider, MD  losartan (COZAAR) 100 MG tablet  06/26/12   Historical Provider, MD  simvastatin (ZOCOR) 40 MG tablet  05/24/12   Historical Provider, MD   BP 174/104  Pulse 67  Temp(Src) 97.7 F (36.5 C) (Oral)  Resp 20  Ht 5\' 1"  (1.549 m)  SpO2 100% Physical Exam  Nursing note and vitals reviewed. Constitutional: She is oriented to person, place, and time. She appears well-developed and well-nourished. No distress.  HENT:  Head: Normocephalic and atraumatic.  Mouth/Throat: Oropharynx is clear and moist.  Eyes: Conjunctivae and EOM are normal. Pupils are equal, round, and reactive to light.  Neck: Normal range of motion.  Cardiovascular: Normal rate and normal heart sounds.   Pulmonary/Chest: Effort normal and breath sounds normal. No respiratory distress.  Abdominal: Soft. Bowel sounds are normal. There is no tenderness.  Musculoskeletal: Normal range of motion.  Neurological: She is alert and oriented to person, place, and time. No cranial nerve deficit. She exhibits normal muscle tone. Coordination normal.  Skin: Skin is warm.    ED Course  Procedures (including critical care time) Labs Review Labs Reviewed - No data to display  Imaging Review No results found.   EKG  Interpretation None      MDM   Final diagnoses:  Hypertension    Patient was over at day surgery she was to get an IM injection. The patient sent here for blood pressure 227/105. They gave her 0.1 mg of clonidine air. Patient is already on clonidine she has been taking her medications as she supposed to. Blood pressure here is improved although still mildly elevated to 174/104. Patient has no acute end organ symptoms. No hypertensive emergency. Patient has no headache no shortness of breath no chest pain no strokelike symptoms. Patient is followed by Dr. Baird Cancer for primary care patient will have her blood pressure checked couple times over the next several days and follow back up with her Dr. to see if her blood pressure needs to be adjusted further. No need for acute intervention today.    Fredia Sorrow, MD 01/08/14 [phone]

## 2014-01-08 NOTE — Discharge Instructions (Signed)
How to Take Your Blood Pressure  These instructions are only for electronic home blood pressure machines. You will need:   An automatic or semi-automatic blood pressure machine.  Fresh batteries for the blood pressure machine. HOW DO I USE THESE TOOLS TO CHECK MY BLOOD PRESSURE?   There are 2 numbers that make up your blood pressure. For example: 120/80.  The first number (120 in our example) is called the "systolic pressure." It is a measure of the pressure in your blood vessels when your heart is pumping blood.  The second number (80 in our example) is called the "diastolic pressure." It is a measure of the pressure in your blood vessels when your heart is resting between beats.  Before you buy a home blood pressure machine, check the size of your arm so you can buy the right size cuff. Here is how to check the size of your arm:  Use a tape measure that shows both inches and centimeters.  Wrap the tape measure around the middle upper part of your arm. You may need someone to help you measure right.  Write down your arm measurement in both inches and centimeters.  To measure your blood pressure right, it is important to have the right size cuff.  If your arm is up to 13 inches (37 to 34 centimeters), get an adult cuff size.  If your arm is 13 to 17 inches (35 to 44 centimeters), get a large adult cuff size.  If your arm is 17 to 20 inches (45 to 52 centimeters), get an adult thigh cuff.  Try to rest or relax for at least 30 minutes before you check your blood pressure.  Do not smoke.  Do not have any drinks with caffeine, such as:  Pop.  Coffee.  Tea.  Check your blood pressure in a quiet room.  Sit down and stretch out your arm on a table. Keep your arm at about the level of your heart. Let your arm relax. GETTING BLOOD PRESSURE READINGS  Make sure you remove any tight-fighting clothing from your arm. Wrap the cuff around your upper arm. Wrap it just above the bend,  and above where you felt the pulse. You should be able to slip a finger between the cuff and your arm. If you cannot slip a finger in the cuff, it is too tight and should be removed and rewrapped.  Some units requires you to manually pump up the arm cuff.  Automatic units inflate the cuff when you press a button.  Cuff deflation is automatic in both models.  After the cuff is inflated, the unit measures your blood pressure and pulse. The readings are displayed on a monitor. Hold still and breathe normally while the cuff is inflated.  Getting a reading takes less than a minute.  Some models store readings in a memory. Some provide a printout of readings.  Get readings at different times of the day. You should wait at least 5 minutes between readings. Take readings with you to your next doctor's visit. Document Released: 07/07/2008 Document Revised: 10/17/2011 Document Reviewed: 07/07/2008 [facility] Patient Information 2014 [place].  If possible it would be helpful to have your blood pressure checked a couple times this week and then followup with your regular doctor next week to see if the blood pressure medicines require any adjustments. As we discussed return for the development of headache shortness of breath chest pain or any kind stroke symptoms. Otherwise continue your current blood  pressure medicine.

## 2014-01-08 NOTE — Progress Notes (Signed)
BP phoned to [facility], Dr Shelva Majestic associate.  She advises to take patient to ED for evaluation as the blood pressure is far out of her range.

## 2014-01-10 ENCOUNTER — Inpatient Hospital Stay (HOSPITAL_COMMUNITY)
Admission: EM | Admit: 2014-01-10 | Discharge: 2014-01-16 | DRG: 078 | Disposition: A | Discharge status: Home Health | Payer: Medicare Other | Attending: Internal Medicine | Admitting: Internal Medicine

## 2014-01-10 ENCOUNTER — Encounter (HOSPITAL_COMMUNITY): Payer: Self-pay | Admitting: Emergency Medicine

## 2014-01-10 ENCOUNTER — Emergency Department (HOSPITAL_COMMUNITY)
Admission: EM | Admit: 2014-01-10 | Discharge: 2014-01-10 | Disposition: A | Discharge status: Home / Self Care | Payer: Medicare Other | Source: Home / Self Care | Attending: Emergency Medicine | Admitting: Emergency Medicine

## 2014-01-10 DIAGNOSIS — I161 Hypertensive emergency: Secondary | ICD-10-CM

## 2014-01-10 DIAGNOSIS — Z833 Family history of diabetes mellitus: Secondary | ICD-10-CM

## 2014-01-10 DIAGNOSIS — E785 Hyperlipidemia, unspecified: Secondary | ICD-10-CM | POA: Insufficient documentation

## 2014-01-10 DIAGNOSIS — E119 Type 2 diabetes mellitus without complications: Secondary | ICD-10-CM | POA: Insufficient documentation

## 2014-01-10 DIAGNOSIS — G459 Transient cerebral ischemic attack, unspecified: Secondary | ICD-10-CM | POA: Diagnosis present

## 2014-01-10 DIAGNOSIS — R4789 Other speech disturbances: Secondary | ICD-10-CM | POA: Diagnosis not present

## 2014-01-10 DIAGNOSIS — E1129 Type 2 diabetes mellitus with other diabetic kidney complication: Secondary | ICD-10-CM | POA: Diagnosis present

## 2014-01-10 DIAGNOSIS — I672 Cerebral atherosclerosis: Secondary | ICD-10-CM | POA: Diagnosis present

## 2014-01-10 DIAGNOSIS — Z823 Family history of stroke: Secondary | ICD-10-CM

## 2014-01-10 DIAGNOSIS — Z880 Allergy status to penicillin: Secondary | ICD-10-CM

## 2014-01-10 DIAGNOSIS — Z79899 Other long term (current) drug therapy: Secondary | ICD-10-CM | POA: Insufficient documentation

## 2014-01-10 DIAGNOSIS — Z794 Long term (current) use of insulin: Secondary | ICD-10-CM

## 2014-01-10 DIAGNOSIS — E1122 Type 2 diabetes mellitus with diabetic chronic kidney disease: Secondary | ICD-10-CM | POA: Diagnosis present

## 2014-01-10 DIAGNOSIS — N183 Chronic kidney disease, stage 3 unspecified: Secondary | ICD-10-CM | POA: Diagnosis present

## 2014-01-10 DIAGNOSIS — Z8249 Family history of ischemic heart disease and other diseases of the circulatory system: Secondary | ICD-10-CM

## 2014-01-10 DIAGNOSIS — I160 Hypertensive urgency: Secondary | ICD-10-CM | POA: Diagnosis present

## 2014-01-10 DIAGNOSIS — I10 Essential (primary) hypertension: Secondary | ICD-10-CM | POA: Insufficient documentation

## 2014-01-10 DIAGNOSIS — I674 Hypertensive encephalopathy: Secondary | ICD-10-CM | POA: Diagnosis not present

## 2014-01-10 DIAGNOSIS — I129 Hypertensive chronic kidney disease with stage 1 through stage 4 chronic kidney disease, or unspecified chronic kidney disease: Secondary | ICD-10-CM | POA: Diagnosis present

## 2014-01-10 DIAGNOSIS — N189 Chronic kidney disease, unspecified: Secondary | ICD-10-CM

## 2014-01-10 HISTORY — DX: Chronic kidney disease, stage 3 unspecified: N18.30

## 2014-01-10 HISTORY — DX: Chronic kidney disease, stage 3 (moderate): N18.3

## 2014-01-10 LAB — COMPREHENSIVE METABOLIC PANEL
ALT: 8 U/L (ref 0–35)
AST: 15 U/L (ref 0–37)
Albumin: 3.2 g/dL — ABNORMAL LOW (ref 3.5–5.2)
Alkaline Phosphatase: 93 U/L (ref 39–117)
BUN: 26 mg/dL — ABNORMAL HIGH (ref 6–23)
CO2: 30 mEq/L (ref 19–32)
Calcium: 9.4 mg/dL (ref 8.4–10.5)
Chloride: 99 mEq/L (ref 96–112)
Creatinine, Ser: 1.96 mg/dL — ABNORMAL HIGH (ref 0.50–1.10)
GFR calc Af Amer: 27 mL/min — ABNORMAL LOW (ref >90)
GFR calc non Af Amer: 24 mL/min — ABNORMAL LOW (ref >90)
Glucose, Bld: 98 mg/dL (ref 70–99)
Potassium: 3.7 mEq/L (ref 3.7–5.3)
Sodium: 140 mEq/L (ref 137–147)
Total Bilirubin: 0.4 mg/dL (ref 0.3–1.2)
Total Protein: 7.9 g/dL (ref 6.0–8.3)

## 2014-01-10 LAB — CBC WITH DIFFERENTIAL/PLATELET
Basophils Absolute: 0 10*3/uL (ref 0.0–0.1)
Basophils Relative: 0 % (ref 0–1)
Eosinophils Absolute: 0.2 10*3/uL (ref 0.0–0.7)
Eosinophils Relative: 4 % (ref 0–5)
HCT: 34.4 % — ABNORMAL LOW (ref 36.0–46.0)
Hemoglobin: 11 g/dL — ABNORMAL LOW (ref 12.0–15.0)
Lymphocytes Relative: 25 % (ref 12–46)
Lymphs Abs: 1.4 10*3/uL (ref 0.7–4.0)
MCH: 22.6 pg — ABNORMAL LOW (ref 26.0–34.0)
MCHC: 32 g/dL (ref 30.0–36.0)
MCV: 70.6 fL — ABNORMAL LOW (ref 78.0–100.0)
Monocytes Absolute: 0.2 10*3/uL (ref 0.1–1.0)
Monocytes Relative: 4 % (ref 3–12)
Neutro Abs: 3.7 10*3/uL (ref 1.7–7.7)
Neutrophils Relative %: 67 % (ref 43–77)
Platelets: 205 10*3/uL (ref 150–400)
RBC: 4.87 MIL/uL (ref 3.87–5.11)
RDW: 16.3 % — ABNORMAL HIGH (ref 11.5–15.5)
WBC: 5.6 10*3/uL (ref 4.0–10.5)

## 2014-01-10 LAB — TROPONIN I: Troponin I: <0.3 ng/mL (ref <0.30)

## 2014-01-10 MED ORDER — LABETALOL HCL 100 MG PO TABS
100.0000 mg | ORAL_TABLET | Freq: Once | ORAL | Status: AC
  Administered 2014-01-10: 100 mg via ORAL
  Filled 2014-01-10: qty 1

## 2014-01-10 NOTE — ED Notes (Signed)
Patient with high blood pressure, seen earlier today for same.  Patient states that her pressure eventually decrease but now has gone back up.  Patient denies any CP, shortness of breath, no nausea or vomiting.

## 2014-01-10 NOTE — ED Provider Notes (Signed)
CSN: [id number]     Arrival date & time 01/10/14  1154 History   First MD Initiated Contact with Patient 01/10/14 1340     Chief Complaint  Patient presents with  . Hypertension     (Consider location/radiation/quality/duration/timing/severity/associated sxs/prior Treatment) HPI Comments: Patient is a 78 year old female who presents with complaints of elevated blood pressure. She has no specific complaints. She has no chest pain, shortness of breath, headache, urinary complaints. His been running high recently and her primary Dr. prescribed her medication which she has not yet filled. Her husband is currently filling this prescription and she will started this evening.  Patient is a 78 y.o. female presenting with hypertension. The history is provided by the patient.  Hypertension This is a chronic problem. The problem occurs constantly. The problem has been gradually worsening. Pertinent negatives include no chest pain, no abdominal pain, no headaches and no shortness of breath. Nothing aggravates the symptoms. Nothing relieves the symptoms.    Past Medical History  Diagnosis Date  . Diabetes mellitus without complication   . Hyperlipidemia   . Hypertension    History reviewed. No pertinent past surgical history. Family History  Problem Relation Age of Onset  . Hypertension Other   . Hyperlipidemia Other   . Stroke Other    History  Substance Use Topics  . Smoking status: Never Smoker   . Smokeless tobacco: Not on file  . Alcohol Use: Not on file   OB History   Grav Para Term Preterm Abortions TAB SAB Ect Mult Living                 Review of Systems  Respiratory: Negative for shortness of breath.   Cardiovascular: Negative for chest pain.  Gastrointestinal: Negative for abdominal pain.  Neurological: Negative for headaches.  All other systems reviewed and are negative.     Allergies  Penicillins  Home Medications   Prior to Admission medications   Medication  Sig Start Date End Date Taking? Authorizing Provider  calcitRIOL (ROCALTROL) 0.25 MCG capsule Take 0.25 mcg by mouth 2 (two) times daily.  06/26/12  Yes Historical Provider, MD  cloNIDine (CATAPRES) 0.1 MG tablet Take 0.1 mg by mouth 3 (three) times daily.  07/16/12  Yes Historical Provider, MD  furosemide (LASIX) 40 MG tablet Take 40 mg by mouth 2 (two) times daily.  06/26/12  Yes Historical Provider, MD  LEVEMIR FLEXPEN 100 UNIT/ML injection Inject 22-25 Units into the skin 2 (two) times daily.  07/12/12  Yes Historical Provider, MD  losartan (COZAAR) 100 MG tablet Take 50 mg by mouth daily.  06/26/12  Yes Historical Provider, MD  simvastatin (ZOCOR) 40 MG tablet Take 40 mg by mouth.  05/24/12  Yes Historical Provider, MD   BP 208/100  Pulse 66  Temp(Src) 97.9 F (36.6 C) (Oral)  Resp 18  SpO2 94% Physical Exam  Nursing note and vitals reviewed. Constitutional: She is oriented to person, place, and time. She appears well-developed and well-nourished. No distress.  HENT:  Head: Normocephalic and atraumatic.  Mouth/Throat: Oropharynx is clear and moist.  Eyes: EOM are normal. Pupils are equal, round, and reactive to light.  Neck: Normal range of motion. Neck supple.  Cardiovascular: Normal rate and regular rhythm.  Exam reveals no gallop and no friction rub.   No murmur heard. Pulmonary/Chest: Effort normal and breath sounds normal. No respiratory distress. She has no wheezes.  Abdominal: Soft. Bowel sounds are normal. She exhibits no distension. There is no tenderness.  Musculoskeletal: Normal range of motion.  Neurological: She is alert and oriented to person, place, and time. No cranial nerve deficit. She exhibits normal muscle tone. Coordination normal.  Skin: Skin is warm and dry. She is not diaphoretic.    ED Course  Procedures (including critical care time) Labs Review Labs Reviewed  CBC WITH DIFFERENTIAL - Abnormal; Notable for the following:    Hemoglobin 11.0 (*)    HCT  34.4 (*)    MCV 70.6 (*)    MCH 22.6 (*)    RDW 16.3 (*)    All other components within normal limits  COMPREHENSIVE METABOLIC PANEL - Abnormal; Notable for the following:    BUN 26 (*)    Creatinine, Ser 1.96 (*)    Albumin 3.2 (*)    GFR calc non Af Amer 24 (*)    GFR calc Af Amer 27 (*)    All other components within normal limits  TROPONIN I    Imaging Review No results found.   EKG Interpretation   Date/Time:  Friday January 10 2014 14:20:57 EDT Ventricular Rate:  65 PR Interval:  150 QRS Duration: 81 QT Interval:  419 QTC Calculation: 436 R Axis:   -12 Text Interpretation:  Sinus rhythm Nonspecific T abnormalities, lateral  leads Baseline wander in lead(s) V5 Confirmed by DELOS  MD, Deloris Mittag  ([id number]) on 01/10/2014 4:13:05 PM      MDM   Final diagnoses:  None    Patient presents with complaints of elevated blood pressure but with no other symptoms. Her systolic is over [id number]. She was given a dose of by mouth labetalol and most recent pressure is [id number] systolic. Her EKG and laboratory studies reveal no evidence for endorgan damage. At this point I feel as though she is appropriate for discharge. She was prescribed a new medication from her primary Dr. which her husband is currently taking up at the pharmacy. I will devise her to take this medication and keep a record of her blood pressures to take with her when she follows up with her primary Dr. She will be discharged to home.    Veryl Speak, MD 01/10/14 [phone]

## 2014-01-10 NOTE — Discharge Instructions (Signed)
Continue your antihypertensive medications as before and after new medication prescribed by your primary provider.  Return to the emergency department if you develop chest pain, difficulty breathing, severe headache or other new and concerning symptoms.   Arterial Hypertension Arterial hypertension (high blood pressure) is a condition of elevated pressure in your blood vessels. Hypertension over a [place] period of time is a risk factor for strokes, heart attacks, and heart failure. It is also the leading cause of kidney (renal) failure.  CAUSES   In Adults -- Over 90% of all hypertension has no known cause. This is called essential or primary hypertension. In the other 10% of people with hypertension, the increase in blood pressure is caused by another disorder. This is called secondary hypertension. Important causes of secondary hypertension are:  Heavy alcohol use.  Obstructive sleep apnea.  Hyperaldosterosim (Conn's syndrome).  Steroid use.  Chronic kidney failure.  Hyperparathyroidism.  Medications.  Renal artery stenosis.  Pheochromocytoma.  Cushing's disease.  Coarctation of the aorta.  Scleroderma renal crisis.  Licorice (in excessive amounts).  Drugs (cocaine, methamphetamine). Your caregiver can explain any items above that apply to you.  In Children -- Secondary hypertension is more common and should always be considered.  Pregnancy -- Few women of childbearing age have high blood pressure. However, up to 10% of them develop hypertension of pregnancy. Generally, this will not harm the woman. It may be a sign of 3 complications of pregnancy: preeclampsia, HELLP syndrome, and eclampsia. Follow up and control with medication is necessary. SYMPTOMS   This condition normally does not produce any noticeable symptoms. It is usually found during a routine exam.  Malignant hypertension is a late problem of high blood pressure. It may have the following  symptoms:  Headaches.  Blurred vision.  End-organ damage (this means your kidneys, heart, lungs, and other organs are being damaged).  Stressful situations can increase the blood pressure. If a person with normal blood pressure has their blood pressure go up while being seen by their caregiver, this is often termed "white coat hypertension." Its importance is not known. It may be related with eventually developing hypertension or complications of hypertension.  Hypertension is often confused with mental tension, stress, and anxiety. DIAGNOSIS  The diagnosis is made by 3 separate blood pressure measurements. They are taken at least 1 week apart from each other. If there is organ damage from hypertension, the diagnosis may be made without repeat measurements. Hypertension is usually identified by having blood pressure readings:  Above 140/90 mmHg measured in both arms, at 3 separate times, over a couple weeks.  Over 130/80 mmHg should be considered a risk factor and may require treatment in patients with diabetes. Blood pressure readings over 120/80 mmHg are called "pre-hypertension" even in non-diabetic patients. To get a true blood pressure measurement, use the following guidelines. Be aware of the factors that can alter blood pressure readings.  Take measurements at least 1 hour after caffeine.  Take measurements 30 minutes after smoking and without any stress. This is another reason to quit smoking  it raises your blood pressure.  Use a proper cuff size. Ask your caregiver if you are not sure about your cuff size.  Most home blood pressure cuffs are automatic. They will measure systolic and diastolic pressures. The systolic pressure is the pressure reading at the start of sounds. Diastolic pressure is the pressure at which the sounds disappear. If you are elderly, measure pressures in multiple postures. Try sitting, lying or standing.  Sit at rest for a minimum of 5 minutes before  taking measurements.  You should not be on any medications like decongestants. These are found in many cold medications.  Record your blood pressure readings and review them with your caregiver. If you have hypertension:  Your caregiver may do tests to be sure you do not have secondary hypertension (see "causes" above).  Your caregiver may also look for signs of metabolic syndrome. This is also called Syndrome X or Insulin Resistance Syndrome. You may have this syndrome if you have type 2 diabetes, abdominal obesity, and abnormal blood lipids in addition to hypertension.  Your caregiver will take your medical and family history and perform a physical exam.  Diagnostic tests may include blood tests (for glucose, cholesterol, potassium, and kidney function), a urinalysis, or an EKG. Other tests may also be necessary depending on your condition. PREVENTION  There are important lifestyle issues that you can adopt to reduce your chance of developing hypertension:  Maintain a normal weight.  Limit the amount of salt (sodium) in your diet.  Exercise often.  Limit alcohol intake.  Get enough potassium in your diet. Discuss specific advice with your caregiver.  Follow a DASH diet (dietary approaches to stop hypertension). This diet is rich in fruits, vegetables, and low-fat dairy products, and avoids certain fats. PROGNOSIS  Essential hypertension cannot be cured. Lifestyle changes and medical treatment can lower blood pressure and reduce complications. The prognosis of secondary hypertension depends on the underlying cause. Many people whose hypertension is controlled with medicine or lifestyle changes can live a normal, healthy life.  RISKS AND COMPLICATIONS  While high blood pressure alone is not an illness, it often requires treatment due to its short- and [place]-term effects on many organs. Hypertension increases your risk for:  CVAs or strokes (cerebrovascular accident).  Heart failure  due to chronically high blood pressure (hypertensive cardiomyopathy).  Heart attack (myocardial infarction).  Damage to the retina (hypertensive retinopathy).  Kidney failure (hypertensive nephropathy). Your caregiver can explain list items above that apply to you. Treatment of hypertension can significantly reduce the risk of complications. TREATMENT   For overweight patients, weight loss and regular exercise are recommended. Physical fitness lowers blood pressure.  Mild hypertension is usually treated with diet and exercise. A diet rich in fruits and vegetables, fat-free dairy products, and foods low in fat and salt (sodium) can help lower blood pressure. Decreasing salt intake decreases blood pressure in a 1/3 of people.  Stop smoking if you are a smoker. The steps above are highly effective in reducing blood pressure. While these actions are easy to suggest, they are difficult to achieve. Most patients with moderate or severe hypertension end up requiring medications to bring their blood pressure down to a normal level. There are several classes of medications for treatment. Blood pressure pills (antihypertensives) will lower blood pressure by their different actions. Lowering the blood pressure by 10 mmHg may decrease the risk of complications by as much as 25%. The goal of treatment is effective blood pressure control. This will reduce your risk for complications. Your caregiver will help you determine the best treatment for you according to your lifestyle. What is excellent treatment for one person, may not be for you. HOME CARE INSTRUCTIONS   Do not smoke.  Follow the lifestyle changes outlined in the "Prevention" section.  If you are on medications, follow the directions carefully. Blood pressure medications must be taken as prescribed. Skipping doses reduces their benefit. It also  puts you at risk for problems.  Follow up with your caregiver, as directed.  If you are asked to  monitor your blood pressure at home, follow the guidelines in the "Diagnosis" section above. SEEK MEDICAL CARE IF:   You think you are having medication side effects.  You have recurrent headaches or lightheadedness.  You have swelling in your ankles.  You have trouble with your vision. SEEK IMMEDIATE MEDICAL CARE IF:   You have sudden onset of chest pain or pressure, difficulty breathing, or other symptoms of a heart attack.  You have a severe headache.  You have symptoms of a stroke (such as sudden weakness, difficulty speaking, difficulty walking). MAKE SURE YOU:   Understand these instructions.  Will watch your condition.  Will get help right away if you are not doing well or get worse. Document Released: 07/25/2005 Document Revised: 10/17/2011 Document Reviewed: 02/22/2007 [facility] Patient Information 2014 [place].

## 2014-01-10 NOTE — ED Notes (Addendum)
Patient brought to room.  Jennifer/tech.  Notified.  Patient given gown with instructions to remove clothing from waist up.  Call button given and warm blanket given to patient.  Patients vitals still taken and documented before she left triage.  [place] aware of blood pressure still elevated.  Nurse Janett Billow notified patient is in room.  Resident at bedside.

## 2014-01-10 NOTE — ED Notes (Signed)
Family member states pt was seen in ED earlier today for htn and was discharged home. After going home BP was 215/165 so pt came back to the ED.  Pt got new Rx filled today from PCP but was unsure if she is suppose to continue her current BP medication and add the new medication or take it instead.  Pt denies any symptoms at present.

## 2014-01-10 NOTE — ED Notes (Signed)
Pt reports she has been having increased BP lately, went to PCP today to have it checked by them, it was elevated there so they prescribed her a new medication and told her to go to ED for further evaluation. Pt denies cp/sob/blurred vision/dizziness/HA/weakness. No slurred speech or facial droop. Equal hand grips. Nad, skin warm and dry, resp e/u.

## 2014-01-11 ENCOUNTER — Encounter (HOSPITAL_COMMUNITY): Payer: Self-pay | Admitting: Internal Medicine

## 2014-01-11 ENCOUNTER — Emergency Department (HOSPITAL_COMMUNITY): Payer: Medicare Other

## 2014-01-11 DIAGNOSIS — Z8249 Family history of ischemic heart disease and other diseases of the circulatory system: Secondary | ICD-10-CM | POA: Diagnosis not present

## 2014-01-11 DIAGNOSIS — R4789 Other speech disturbances: Secondary | ICD-10-CM | POA: Diagnosis present

## 2014-01-11 DIAGNOSIS — G459 Transient cerebral ischemic attack, unspecified: Secondary | ICD-10-CM | POA: Diagnosis present

## 2014-01-11 DIAGNOSIS — Z823 Family history of stroke: Secondary | ICD-10-CM | POA: Diagnosis not present

## 2014-01-11 DIAGNOSIS — Z833 Family history of diabetes mellitus: Secondary | ICD-10-CM | POA: Diagnosis not present

## 2014-01-11 DIAGNOSIS — N183 Chronic kidney disease, stage 3 unspecified: Secondary | ICD-10-CM | POA: Diagnosis present

## 2014-01-11 DIAGNOSIS — Z794 Long term (current) use of insulin: Secondary | ICD-10-CM | POA: Diagnosis not present

## 2014-01-11 DIAGNOSIS — I517 Cardiomegaly: Secondary | ICD-10-CM

## 2014-01-11 DIAGNOSIS — E785 Hyperlipidemia, unspecified: Secondary | ICD-10-CM | POA: Diagnosis present

## 2014-01-11 DIAGNOSIS — Z79899 Other long term (current) drug therapy: Secondary | ICD-10-CM | POA: Diagnosis not present

## 2014-01-11 DIAGNOSIS — I672 Cerebral atherosclerosis: Secondary | ICD-10-CM | POA: Diagnosis present

## 2014-01-11 DIAGNOSIS — Z880 Allergy status to penicillin: Secondary | ICD-10-CM | POA: Diagnosis not present

## 2014-01-11 DIAGNOSIS — I674 Hypertensive encephalopathy: Secondary | ICD-10-CM | POA: Diagnosis present

## 2014-01-11 DIAGNOSIS — E119 Type 2 diabetes mellitus without complications: Secondary | ICD-10-CM | POA: Diagnosis present

## 2014-01-11 DIAGNOSIS — I129 Hypertensive chronic kidney disease with stage 1 through stage 4 chronic kidney disease, or unspecified chronic kidney disease: Secondary | ICD-10-CM | POA: Diagnosis present

## 2014-01-11 DIAGNOSIS — E1122 Type 2 diabetes mellitus with diabetic chronic kidney disease: Secondary | ICD-10-CM | POA: Diagnosis present

## 2014-01-11 DIAGNOSIS — I160 Hypertensive urgency: Secondary | ICD-10-CM | POA: Diagnosis present

## 2014-01-11 DIAGNOSIS — E1129 Type 2 diabetes mellitus with other diabetic kidney complication: Secondary | ICD-10-CM | POA: Diagnosis present

## 2014-01-11 LAB — URINALYSIS, ROUTINE W REFLEX MICROSCOPIC
Bilirubin Urine: NEGATIVE
Glucose, UA: NEGATIVE mg/dL
Hgb urine dipstick: NEGATIVE
Ketones, ur: NEGATIVE mg/dL
Leukocytes, UA: NEGATIVE
Nitrite: NEGATIVE
Protein, ur: >300 mg/dL — AB
Specific Gravity, Urine: 1.017 (ref 1.005–1.030)
Urobilinogen, UA: 1 mg/dL (ref 0.0–1.0)
pH: 6.5 (ref 5.0–8.0)

## 2014-01-11 LAB — LIPID PANEL
Cholesterol: 201 mg/dL — ABNORMAL HIGH (ref 0–200)
Cholesterol: 190 mg/dL (ref 0–200)
HDL: 59 mg/dL (ref >39)
HDL: 56 mg/dL (ref >39)
LDL Cholesterol: 97 mg/dL (ref 0–99)
LDL Cholesterol: 92 mg/dL (ref 0–99)
Total CHOL/HDL Ratio: 3.4 RATIO
Total CHOL/HDL Ratio: 3.4 RATIO
Triglycerides: 225 mg/dL — ABNORMAL HIGH (ref <150)
Triglycerides: 212 mg/dL — ABNORMAL HIGH (ref <150)
VLDL: 45 mg/dL — ABNORMAL HIGH (ref 0–40)
VLDL: 42 mg/dL — ABNORMAL HIGH (ref 0–40)

## 2014-01-11 LAB — HEMOGLOBIN A1C
Hgb A1c MFr Bld: 8.5 % — ABNORMAL HIGH (ref <5.7)
Mean Plasma Glucose: 197 mg/dL — ABNORMAL HIGH (ref <117)

## 2014-01-11 LAB — URINE MICROSCOPIC-ADD ON

## 2014-01-11 LAB — GLUCOSE, CAPILLARY
Glucose-Capillary: 172 mg/dL — ABNORMAL HIGH (ref 70–99)
Glucose-Capillary: 171 mg/dL — ABNORMAL HIGH (ref 70–99)
Glucose-Capillary: 150 mg/dL — ABNORMAL HIGH (ref 70–99)
Glucose-Capillary: 178 mg/dL — ABNORMAL HIGH (ref 70–99)
Glucose-Capillary: 155 mg/dL — ABNORMAL HIGH (ref 70–99)

## 2014-01-11 LAB — TROPONIN I
Troponin I: <0.3 ng/mL (ref <0.30)
Troponin I: <0.3 ng/mL (ref <0.30)

## 2014-01-11 MED ORDER — INSULIN DETEMIR 100 UNIT/ML ~~LOC~~ SOLN
25.0000 [IU] | Freq: Every day | SUBCUTANEOUS | Status: DC
  Administered 2014-01-11: 25 [IU] via SUBCUTANEOUS
  Filled 2014-01-11 (×2): qty 0.25

## 2014-01-11 MED ORDER — STROKE: EARLY STAGES OF RECOVERY BOOK
Freq: Once | Status: AC
  Administered 2014-01-11: 1
  Filled 2014-01-11: qty 1

## 2014-01-11 MED ORDER — INSULIN ASPART 100 UNIT/ML ~~LOC~~ SOLN
0.0000 [IU] | Freq: Three times a day (TID) | SUBCUTANEOUS | Status: DC
  Administered 2014-01-11: 1 [IU] via SUBCUTANEOUS
  Administered 2014-01-11 (×2): 2 [IU] via SUBCUTANEOUS
  Administered 2014-01-12 (×2): 1 [IU] via SUBCUTANEOUS
  Administered 2014-01-13 – 2014-01-14 (×3): 2 [IU] via SUBCUTANEOUS
  Administered 2014-01-14 – 2014-01-16 (×2): 1 [IU] via SUBCUTANEOUS

## 2014-01-11 MED ORDER — FUROSEMIDE 40 MG PO TABS
40.0000 mg | ORAL_TABLET | Freq: Two times a day (BID) | ORAL | Status: DC
  Administered 2014-01-11 – 2014-01-13 (×6): 40 mg via ORAL
  Filled 2014-01-11 (×9): qty 1

## 2014-01-11 MED ORDER — ASPIRIN 81 MG PO CHEW
324.0000 mg | CHEWABLE_TABLET | Freq: Once | ORAL | Status: AC
  Administered 2014-01-11: 324 mg via ORAL
  Filled 2014-01-11: qty 4

## 2014-01-11 MED ORDER — ENOXAPARIN SODIUM 30 MG/0.3ML ~~LOC~~ SOLN
30.0000 mg | SUBCUTANEOUS | Status: DC
  Administered 2014-01-11 – 2014-01-16 (×6): 30 mg via SUBCUTANEOUS
  Filled 2014-01-11 (×6): qty 0.3

## 2014-01-11 MED ORDER — LABETALOL HCL 5 MG/ML IV SOLN
10.0000 mg | Freq: Once | INTRAVENOUS | Status: AC
  Administered 2014-01-11: 10 mg via INTRAVENOUS
  Filled 2014-01-11: qty 4

## 2014-01-11 MED ORDER — INSULIN DETEMIR 100 UNIT/ML ~~LOC~~ SOLN
22.0000 [IU] | Freq: Every day | SUBCUTANEOUS | Status: DC
  Administered 2014-01-11: 22 [IU] via SUBCUTANEOUS
  Filled 2014-01-11 (×2): qty 0.22

## 2014-01-11 MED ORDER — HYDRALAZINE HCL 20 MG/ML IJ SOLN
10.0000 mg | INTRAMUSCULAR | Status: DC | PRN
  Administered 2014-01-11 – 2014-01-14 (×5): 10 mg via INTRAVENOUS
  Filled 2014-01-11 (×5): qty 1

## 2014-01-11 MED ORDER — LABETALOL HCL 5 MG/ML IV SOLN
20.0000 mg | Freq: Once | INTRAVENOUS | Status: AC
  Administered 2014-01-11: 20 mg via INTRAVENOUS
  Filled 2014-01-11: qty 4

## 2014-01-11 MED ORDER — LOSARTAN POTASSIUM 50 MG PO TABS
50.0000 mg | ORAL_TABLET | Freq: Every day | ORAL | Status: DC
  Administered 2014-01-11 – 2014-01-16 (×6): 50 mg via ORAL
  Filled 2014-01-11 (×6): qty 1

## 2014-01-11 MED ORDER — SODIUM CHLORIDE 0.9 % IV SOLN
INTRAVENOUS | Status: DC
  Administered 2014-01-11: 06:00:00 via INTRAVENOUS

## 2014-01-11 MED ORDER — ASPIRIN 325 MG PO TABS
325.0000 mg | ORAL_TABLET | Freq: Every day | ORAL | Status: DC
  Administered 2014-01-11 – 2014-01-16 (×6): 325 mg via ORAL
  Filled 2014-01-11 (×6): qty 1

## 2014-01-11 MED ORDER — CALCITRIOL 0.25 MCG PO CAPS
0.2500 ug | ORAL_CAPSULE | Freq: Two times a day (BID) | ORAL | Status: DC
  Administered 2014-01-11 – 2014-01-16 (×10): 0.25 ug via ORAL
  Filled 2014-01-11 (×13): qty 1

## 2014-01-11 MED ORDER — SIMVASTATIN 40 MG PO TABS
40.0000 mg | ORAL_TABLET | Freq: Every day | ORAL | Status: DC
  Administered 2014-01-11 – 2014-01-14 (×4): 40 mg via ORAL
  Filled 2014-01-11 (×4): qty 1

## 2014-01-11 MED ORDER — INSULIN ASPART 100 UNIT/ML ~~LOC~~ SOLN: 0.0000 [IU] | Freq: Every day | SUBCUTANEOUS | Status: DC

## 2014-01-11 MED ORDER — INSULIN DETEMIR 100 UNIT/ML FLEXPEN: 22.0000 [IU] | PEN_INJECTOR | Freq: Two times a day (BID) | SUBCUTANEOUS | Status: DC

## 2014-01-11 MED ORDER — CLONIDINE HCL 0.1 MG PO TABS
0.1000 mg | ORAL_TABLET | Freq: Three times a day (TID) | ORAL | Status: DC
  Administered 2014-01-11 – 2014-01-13 (×9): 0.1 mg via ORAL
  Filled 2014-01-11 (×10): qty 1

## 2014-01-11 MED ORDER — ACETAMINOPHEN 325 MG PO TABS
650.0000 mg | ORAL_TABLET | ORAL | Status: DC | PRN
  Administered 2014-01-12 – 2014-01-13 (×2): 650 mg via ORAL
  Filled 2014-01-11 (×2): qty 2

## 2014-01-11 NOTE — ED Notes (Signed)
[facility] charge RN called concerning pt's blood pressure.  Notified her that admitting MD requests pt's home meds be given and BP rechecked to see if pt is able to go to [facility].

## 2014-01-11 NOTE — H&P (Signed)
PCP:     Maximino Greenland, MD  Nephrologist [facility]  Chief Complaint:  Slurred speech  HPI: [name] [name] is a 78 y.o. female   has a past medical history of Diabetes mellitus without complication; Hyperlipidemia; and Hypertension.   Presented with  Patient initially was seen in ER for hypertensive urgency and was discharged to home. Later on in the evening she developed slurred speech and family brought her back in. Her blood pressure initially was up to  243/112 no down to 170/88. Patient reports taking all of he medications. Patient has hx of CKD and have been getting iron infusions 2 days ago she could not complete it due to severe HTN and was sent to ER as well.  Patient denies chest pain, no SOB. Patient reports dietary indiscretions and increased blood pressure with activity.   Hospitalist was called for admission for hypertensive urgency and TIA  Review of Systems:    Pertinent positives include: confusion, slurred speech  Constitutional:  No weight loss, night sweats, Fevers, chills, fatigue, weight loss  HEENT:  No headaches, Difficulty swallowing,Tooth/dental problems,Sore throat,  No sneezing, itching, ear ache, nasal congestion, post nasal drip,  Cardio-vascular:  No chest pain, Orthopnea, PND, anasarca, dizziness, palpitations.no Bilateral lower extremity swelling  GI:  No heartburn, indigestion, abdominal pain, nausea, vomiting, diarrhea, change in bowel habits, loss of appetite, melena, blood in stool, hematemesis Resp:  no shortness of breath at rest. No dyspnea on exertion, No excess mucus, no productive cough, No non-productive cough, No coughing up of blood.No change in color of mucus.No wheezing. Skin:  no rash or lesions. No jaundice GU:  no dysuria, change in color of urine, no urgency or frequency. No straining to urinate.  No flank pain.  Musculoskeletal:  No joint pain or no joint swelling. No decreased range of motion. No back pain.    Psych:  No change in mood or affect. No depression or anxiety. No memory loss.  Neuro: no localizing neurological complaints, no tingling, no weakness, no double vision, no gait abnormality, no , no   Otherwise ROS are negative except for above, 10 systems were reviewed  Past Medical History: Past Medical History  Diagnosis Date  . Diabetes mellitus without complication   . Hyperlipidemia   . Hypertension    History reviewed. No pertinent past surgical history.   Medications: Prior to Admission medications   Medication Sig Start Date End Date Taking? Authorizing Provider  calcitRIOL (ROCALTROL) 0.25 MCG capsule Take 0.25 mcg by mouth 2 (two) times daily.  06/26/12  Yes Historical Provider, MD  cloNIDine (CATAPRES) 0.1 MG tablet Take 0.1 mg by mouth 3 (three) times daily.  07/16/12  Yes Historical Provider, MD  furosemide (LASIX) 40 MG tablet Take 40 mg by mouth 2 (two) times daily.  06/26/12  Yes Historical Provider, MD  LEVEMIR FLEXPEN 100 UNIT/ML injection Inject 22-25 Units into the skin 2 (two) times daily.  07/12/12  Yes Historical Provider, MD  losartan (COZAAR) 100 MG tablet Take 50 mg by mouth daily.  06/26/12  Yes Historical Provider, MD  simvastatin (ZOCOR) 40 MG tablet Take 40 mg by mouth.  05/24/12  Yes Historical Provider, MD    Allergies:   Allergies  Allergen Reactions  . Penicillins Swelling and Rash    Social History:  Ambulatory   Independently  Lives at home  With family     reports that she has never smoked. She does not have any smokeless tobacco history on file.  She reports that she does not drink alcohol or use illicit drugs.    Family History: family history includes Diabetes type II in her father and mother; Hyperlipidemia in her other; Hypertension in her mother and other; Stroke in her other.    Physical Exam: Patient Vitals for the past 24 hrs:  BP Temp Temp src Pulse Resp SpO2 Height  01/11/14 0230 184/85 mmHg - - 94 24 92 % -  01/11/14  0215 195/81 mmHg - - 90 21 95 % -  01/11/14 0200 197/89 mmHg - - 95 19 96 % -  01/11/14 0148 - 98.5 F (36.9 C) - - - - -  01/11/14 0145 175/88 mmHg - - 94 18 96 % -  01/11/14 0115 192/90 mmHg - - 90 16 97 % -  01/11/14 0100 225/115 mmHg - - 89 22 98 % -  01/11/14 0045 212/86 mmHg - - 86 19 98 % -  01/11/14 0030 225/100 mmHg - - 84 19 98 % -  01/11/14 0015 223/85 mmHg - - 83 24 97 % -  01/11/14 0000 220/92 mmHg - - 79 16 97 % -  01/10/14 2345 221/99 mmHg - - 83 18 98 % -  01/10/14 2326 225/106 mmHg - - 90 18 - -  01/10/14 2127 215/101 mmHg 98.2 F (36.8 C) Oral 90 16 97 % 5\' 1"  (1.549 m)    1. General:  in No Acute distress 2. Psychological: Alert and  Oriented 3. Head/ENT:   Moist  Mucous Membranes                          Head Non traumatic, neck supple                          Normal   Dentition 4. SKIN: normal  Skin turgor,  Skin clean Dry and intact no rash 5. Heart: Regular rate and rhythm no Murmur, Rub or gallop 6. Lungs: Clear to auscultation bilaterally, no wheezes or crackles   7. Abdomen: Soft, non-tender, Non distended, obese 8. Lower extremities: no clubbing, cyanosis, or edema 9. Neurologically strength 5/5 in all 4 ext. CN 2-12 intact 10. MSK: Normal range of motion  body mass index is unknown because there is no weight on file.   Labs on Admission:   Recent Labs  01/10/14 1506  NA 140  K 3.7  CL 99  CO2 30  GLUCOSE 98  BUN 26*  CREATININE 1.96*  CALCIUM 9.4    Recent Labs  01/10/14 1506  AST 15  ALT 8  ALKPHOS 93  BILITOT 0.4  PROT 7.9  ALBUMIN 3.2*   No results found for this basename: LIPASE, AMYLASE,  in the last 72 hours  Recent Labs  01/10/14 1506  WBC 5.6  NEUTROABS 3.7  HGB 11.0*  HCT 34.4*  MCV 70.6*  PLT 205    Recent Labs  01/10/14 1506  TROPONINI <0.30   No results found for this basename: TSH, T4TOTAL, FREET3, T3FREE, THYROIDAB,  in the last 72 hours No results found for this basename: VITAMINB12, FOLATE,  FERRITIN, TIBC, IRON, RETICCTPCT,  in the last 72 hours No results found for this basename: HGBA1C    The CrCl is unknown because both a height and weight (above a minimum accepted value) are required for this calculation. ABG No results found for this basename: phart, pco2, po2, hco3, tco2, acidbasedef, o2sat  No results found for this basename: DDIMER     Other results:  I have pearsonaly reviewed this: ECG REPORT  Rate: 88  Rhythm: NSR ST&T Change: no ischemic cahnges   UA no evidence of infection  BNP (last 3 results) No results found for this basename: PROBNP,  in the last 8760 hours  There were no vitals filed for this visit.   Cultures: No results found for this basename: sdes, specrequest, cult, reptstatus    Radiological Exams on Admission: Ct Head Wo Contrast  01/11/2014   CLINICAL DATA:  Hypertension  EXAM: CT HEAD WITHOUT CONTRAST  TECHNIQUE: Contiguous axial images were obtained from the base of the skull through the vertex without intravenous contrast.  COMPARISON:  None.  FINDINGS: The ventricles are normal in size and position. There is minimal age appropriate diffuse cerebral atrophy. There is no intracranial hemorrhage nor evidence of an evolving ischemic event. There is encephalomalacia in the left posterior parietal lobe. There is decreased density in the deep white matter of both cerebral hemispheres consistent with chronic small vessel ischemia.  At bone window settings the observed portions of the paranasal sinuses and mastoid air cells are clear. There is no lytic or blastic bony lesion nor evidence of a skull fracture.  IMPRESSION: 1. There is no acute intracranial hemorrhage nor other acute intracranial abnormality. 2. 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   Electronically Signed   By: David  Martinique   On: 01/11/2014 01:39    Chart  has been reviewed  Assessment/Plan  78 yo F with uncontrolled HTn and DM complicated by CKD here with hypertensive urgency and TIA  Present on Admission:  . TIA (transient ischemic attack) -  - will admit based on TIA/CVA protocol, await results of MRA/MRI, Carotid Doppler and Echo, obtain cardiac enzymes,  ECG,  Lipid panel, TSH. Order PT/OT evaluation. Will make sure patient is on antiplatelet agent. Neurology consult if MRI shows CVA.     Marland Kitchen Hypertensive urgency - restart home meds, hydralazine PRN. Will need to work on diet compliance and optimization of home meds as outpatient.  . Diabetes mellitus - continue home dose of Levemir and write for SSI   Prophylaxis: SCD    CODE STATUS:  FULL CODE    Other plan as per orders.  I have spent a total of 55 min on this admission  Reba Hulett 01/11/2014, 2:58 AM

## 2014-01-11 NOTE — ED Notes (Signed)
Admitting MD at bedside.

## 2014-01-11 NOTE — Progress Notes (Signed)
[facility] PROGRESS NOTE  [name] [name] [id number] DOB: 06-16-1936 DOA: 01/10/2014 PCP: Maximino Greenland, MD  Assessment/Plan: Present on Admission:  . TIA/ vs hypertensive encephalopathy -resolved -continue ASA 325Mg  daily -FU  MRA/MRI, Carotid Doppler and Echo, , ECG, Lipid panel, TSH.  -PT/OT   . Hypertensive urgency  -improved -back on clonidine and losartan -hydralazine PRN  . Diabetes mellitus  - continue home dose of Levemir and SSI   CKD: -stable  Prophylaxis: lovenox  Code Status: Full Code Family Communication: none at bedside Disposition Plan: home tomorrow pending workup   HPI/Subjective: Feels ok, speech almost back to baseline  Objective: Filed Vitals:   01/11/14 1026  BP: 156/68  Pulse:   Temp:   Resp:     Intake/Output Summary (Last 24 hours) at 01/11/14 1050 Last data filed at 01/11/14 0830  Gross per 24 hour  Intake    240 ml  Output      0 ml  Net    240 ml   Filed Weights   01/11/14 0507  Weight: 83.416 kg (183 lb 14.4 oz)    Exam:   General:  AAOx3, no distress  HEENT: PERRLA, EOMI,   Cardiovascular: S1S2/RRR  Respiratory: CTAB  Abdomen: soft, Nt, BS present  Musculoskeletal: no edema c/c   Neuro: non focal  Data Reviewed: Basic Metabolic Panel:  Recent Labs Lab 01/10/14 1506  NA 140  K 3.7  CL 99  CO2 30  GLUCOSE 98  BUN 26*  CREATININE 1.96*  CALCIUM 9.4   Liver Function Tests:  Recent Labs Lab 01/10/14 1506  AST 15  ALT 8  ALKPHOS 93  BILITOT 0.4  PROT 7.9  ALBUMIN 3.2*   No results found for this basename: LIPASE, AMYLASE,  in the last 168 hours No results found for this basename: AMMONIA,  in the last 168 hours CBC:  Recent Labs Lab 01/10/14 1506  WBC 5.6  NEUTROABS 3.7  HGB 11.0*  HCT 34.4*  MCV 70.6*  PLT 205   Cardiac Enzymes:  Recent Labs Lab 01/10/14 1506 01/11/14 0343  TROPONINI <0.30 <0.30   BNP (last 3 results) No results found for this basename:  PROBNP,  in the last 8760 hours CBG:  Recent Labs Lab 01/11/14 0800  GLUCAP 171*    No results found for this or any previous visit (from the past 240 hour(s)).   Studies: Ct Head Wo Contrast  01/11/2014   CLINICAL DATA:  Hypertension  EXAM: CT HEAD WITHOUT CONTRAST  TECHNIQUE: Contiguous axial images were obtained from the base of the skull through the vertex without intravenous contrast.  COMPARISON:  None.  FINDINGS: The ventricles are normal in size and position. There is minimal age appropriate diffuse cerebral atrophy. There is no intracranial hemorrhage nor evidence of an evolving ischemic event. There is encephalomalacia in the left posterior parietal lobe. There is decreased density in the deep white matter of both cerebral hemispheres consistent with chronic small vessel ischemia.  At bone window settings the observed portions of the paranasal sinuses and mastoid air cells are clear. There is no lytic or blastic bony lesion nor evidence of a skull fracture.  IMPRESSION: 1. There is no acute intracranial hemorrhage nor other acute intracranial abnormality. 2. 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   Electronically Signed   By: David  Martinique   On: 01/11/2014 01:39  Scheduled Meds: . aspirin  325 mg Oral Daily  . calcitRIOL  0.25 mcg Oral BID  . cloNIDine  0.1 mg Oral TID  . furosemide  40 mg Oral BID  . insulin aspart  0-5 Units Subcutaneous QHS  . insulin aspart  0-9 Units Subcutaneous TID WC  . insulin detemir  22 Units Subcutaneous Daily  . insulin detemir  25 Units Subcutaneous QHS  . losartan  50 mg Oral Daily  . simvastatin  40 mg Oral q1800   Continuous Infusions:  Antibiotics Given (last 72 hours)   None      Active Problems:   TIA (transient ischemic attack)   Hypertensive urgency   Diabetes mellitus    Time spent: 56min    Taariq Leitz  [facility] Pager [phone]. If 7PM-7AM, please contact night-coverage at [email or web address], password [facility] 01/11/2014, 10:50 AM  LOS: 1 day

## 2014-01-11 NOTE — ED Notes (Signed)
Pt to CT

## 2014-01-11 NOTE — ED Notes (Signed)
Spoke with Janett Billow, Charge RN on 3W regarding pt's current BP

## 2014-01-11 NOTE — ED Notes (Signed)
Pt returned from CT °

## 2014-01-11 NOTE — ED Provider Notes (Signed)
CSN: [id number]     Arrival date & time 01/10/14  2120 History   First MD Initiated Contact with Patient 01/11/14 0003     Chief Complaint  Patient presents with  . Hypertension     (Consider location/radiation/quality/duration/timing/severity/associated sxs/prior Treatment) HPI This is a 78 year old woman with a history of hypertension who was here throughout most of yesterday in the emergency department. She presented with hypertensive urgency and was treated in the emergency department and released around 1800 hours.  Shortly after the patient got home, at about 2100 hours, she developed slurred speech. Her daughter was with her. The daughter also appreciated slurred speech. She describes more slurred speech rather than expressive aphasia. The symptoms lasted about 60 minutes and resolved entirely without intervention. Never before has the patient experienced any TIAs or stroke like symptoms.  When her symptoms began, her daughter checked her blood pressure and the systolic was in the [id number] range. Thus, the patient's daughter brought her to the emergency department. Her initial blood pressure on presentation was 215/101. The time of my of elevation, the patient's BP is 212/100.   No pain. No Sob.   Patient notes she was started on a new medication today. The new med is Clonidine 0.1mg  po bid. Daughter gave her a tablet around 2100h after she noticed degree of HTN.  Past Medical History  Diagnosis Date  . Diabetes mellitus without complication   . Hyperlipidemia   . Hypertension    History reviewed. No pertinent past surgical history. Family History  Problem Relation Age of Onset  . Hypertension Other   . Hyperlipidemia Other   . Stroke Other    History  Substance Use Topics  . Smoking status: Never Smoker   . Smokeless tobacco: Not on file  . Alcohol Use: Not on file   OB History   Grav Para Term Preterm Abortions TAB SAB Ect Mult Living                 Review of  Systems Ten point review of symptoms performed and is negative with the exception of symptoms noted above.     Allergies  Penicillins  Home Medications   Prior to Admission medications   Medication Sig Start Date End Date Taking? Authorizing Provider  calcitRIOL (ROCALTROL) 0.25 MCG capsule Take 0.25 mcg by mouth 2 (two) times daily.  06/26/12  Yes Historical Provider, MD  cloNIDine (CATAPRES) 0.1 MG tablet Take 0.1 mg by mouth 3 (three) times daily.  07/16/12  Yes Historical Provider, MD  furosemide (LASIX) 40 MG tablet Take 40 mg by mouth 2 (two) times daily.  06/26/12  Yes Historical Provider, MD  LEVEMIR FLEXPEN 100 UNIT/ML injection Inject 22-25 Units into the skin 2 (two) times daily.  07/12/12  Yes Historical Provider, MD  losartan (COZAAR) 100 MG tablet Take 50 mg by mouth daily.  06/26/12  Yes Historical Provider, MD  simvastatin (ZOCOR) 40 MG tablet Take 40 mg by mouth.  05/24/12  Yes Historical Provider, MD   BP 221/99  Pulse 83  Temp(Src) 98.2 F (36.8 C) (Oral)  Resp 18  Ht 5\' 1"  (1.549 m)  SpO2 98% Physical Exam Gen: well developed and well nourished appearing Head: NCAT Eyes: PERL, EOMI Nose: no epistaixis or rhinorrhea Mouth/throat: mucosa is moist and pink Neck: supple, no stridor Lungs: CTA B, no wheezing, rhonchi or rales CV: RRR, no murmur, extremities appear well perfused.  Abd: obese, soft, notender, nondistended Back: no ttp, no cva ttp Skin:  warm and dry Ext: normal to inspection, no dependent edema Neuro: CN ii-xii grossly intact, normal motor strength both arms and legs ( all major muscle groups), sensation intact to light touch throughout. Normal speech. no focal deficits Psyche; normal affect,  calm and cooperative.   ED Course  Procedures (including critical care time) Labs Review . EKG: nsr, no acute ischemic changes, normal intervals, normal axis, normal qrs complex Head CT: No acute intracranial process, findings consistent with chronic  infarct and left parietal lobe noted by radiology.   MDM   DDX: hypertensive encephalopathy v. TIA are greatest concerns right now. I believe that we can clinically exclude hypertensive encephalopathy if the patient mental status is normal at this time. CT scan of the brain is ordered to evaluate. Tx HTN emergency with IV labetalol. Will monitor BP closely. Anticipate admission.   1346: NAICP on CXR. Treating with ASA. Hospitalist paged to request admission.     Elyn Peers, MD 01/11/14 [phone]

## 2014-01-12 ENCOUNTER — Inpatient Hospital Stay (HOSPITAL_COMMUNITY): Payer: Medicare Other

## 2014-01-12 LAB — GLUCOSE, CAPILLARY
Glucose-Capillary: 62 mg/dL — ABNORMAL LOW (ref 70–99)
Glucose-Capillary: 136 mg/dL — ABNORMAL HIGH (ref 70–99)
Glucose-Capillary: 148 mg/dL — ABNORMAL HIGH (ref 70–99)
Glucose-Capillary: 147 mg/dL — ABNORMAL HIGH (ref 70–99)
Glucose-Capillary: 196 mg/dL — ABNORMAL HIGH (ref 70–99)

## 2014-01-12 MED ORDER — INSULIN DETEMIR 100 UNIT/ML ~~LOC~~ SOLN
20.0000 [IU] | Freq: Every day | SUBCUTANEOUS | Status: DC
  Administered 2014-01-12 – 2014-01-15 (×4): 20 [IU] via SUBCUTANEOUS
  Filled 2014-01-12 (×5): qty 0.2

## 2014-01-12 MED ORDER — POLYETHYLENE GLYCOL 3350 17 G PO PACK
17.0000 g | PACK | Freq: Every day | ORAL | Status: DC
  Administered 2014-01-12 – 2014-01-16 (×5): 17 g via ORAL
  Filled 2014-01-12 (×5): qty 1

## 2014-01-12 MED ORDER — HYDRALAZINE HCL 25 MG PO TABS
25.0000 mg | ORAL_TABLET | Freq: Three times a day (TID) | ORAL | Status: DC
  Administered 2014-01-12 – 2014-01-13 (×4): 25 mg via ORAL
  Filled 2014-01-12 (×7): qty 1

## 2014-01-12 NOTE — Progress Notes (Signed)
1543 Hydralazine 10mg  PRN dose given IV d/t B/P 203/86 F/U B/P 176/81

## 2014-01-12 NOTE — Progress Notes (Signed)
[facility] PROGRESS NOTE  [name] [name] [id number] DOB: 07/07/1936 DOA: 01/10/2014 PCP: Maximino Greenland, MD  Assessment/Plan: Present on Admission:  . TIA/ vs hypertensive encephalopathy -resolved -continue ASA 325Mg  daily -just back from  MRA/MRI today, results pending will FU, Carotid Doppler and Echo, ,  -LDL 92, hhaic 8.5,  -PT/OT   . Hypertensive urgency  -Bp high again this am -add hydralazine -continue clonidine and losartan -hydralazine PRN  . Diabetes mellitus  - continue home dose of Levemir and SSI   CKD: -stable  Prophylaxis: lovenox  Code Status: Full Code Family Communication: none at bedside Disposition Plan: home tomorrow pending workup   HPI/Subjective: Feels ok, BP still high, no slurring or other neuro symptoms  Objective: Filed Vitals:   01/12/14 1125  BP: 185/82  Pulse: 95  Temp: 97.5 F (36.4 C)  Resp: 18    Intake/Output Summary (Last 24 hours) at 01/12/14 1441 Last data filed at 01/12/14 0433  Gross per 24 hour  Intake    600 ml  Output   1175 ml  Net   -575 ml   Filed Weights   01/11/14 0507 01/12/14 0400  Weight: 83.416 kg (183 lb 14.4 oz) 82.918 kg (182 lb 12.8 oz)    Exam:   General:  AAOx3, no distress  HEENT: PERRLA, EOMI,   Cardiovascular: S1S2/RRR  Respiratory: CTAB  Abdomen: soft, Nt, BS present  Musculoskeletal: no edema c/c   Neuro: non focal  Data Reviewed: Basic Metabolic Panel:  Recent Labs Lab 01/10/14 1506  NA 140  K 3.7  CL 99  CO2 30  GLUCOSE 98  BUN 26*  CREATININE 1.96*  CALCIUM 9.4   Liver Function Tests:  Recent Labs Lab 01/10/14 1506  AST 15  ALT 8  ALKPHOS 93  BILITOT 0.4  PROT 7.9  ALBUMIN 3.2*   No results found for this basename: LIPASE, AMYLASE,  in the last 168 hours No results found for this basename: AMMONIA,  in the last 168 hours CBC:  Recent Labs Lab 01/10/14 1506  WBC 5.6  NEUTROABS 3.7  HGB 11.0*  HCT 34.4*  MCV 70.6*  PLT 205    Cardiac Enzymes:  Recent Labs Lab 01/10/14 1506 01/11/14 0343 01/11/14 1035  TROPONINI <0.30 <0.30 <0.30   BNP (last 3 results) No results found for this basename: PROBNP,  in the last 8760 hours CBG:  Recent Labs Lab 01/11/14 1609 01/11/14 2031 01/12/14 0743 01/12/14 0835 01/12/14 1124  GLUCAP 178* 155* 62* 136* 148*    No results found for this or any previous visit (from the past 240 hour(s)).   Studies: Ct Head Wo Contrast  01/11/2014   CLINICAL DATA:  Hypertension  EXAM: CT HEAD WITHOUT CONTRAST  TECHNIQUE: Contiguous axial images were obtained from the base of the skull through the vertex without intravenous contrast.  COMPARISON:  None.  FINDINGS: The ventricles are normal in size and position. There is minimal age appropriate diffuse cerebral atrophy. There is no intracranial hemorrhage nor evidence of an evolving ischemic event. There is encephalomalacia in the left posterior parietal lobe. There is decreased density in the deep white matter of both cerebral hemispheres consistent with chronic small vessel ischemia.  At bone window settings the observed portions of the paranasal sinuses and mastoid air cells are clear. There is no lytic or blastic bony lesion nor evidence of a skull fracture.  IMPRESSION: 1. There is no acute intracranial hemorrhage nor other acute intracranial abnormality. 2. 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   Electronically Signed   By: David  Martinique   On: 01/11/2014 01:39   Mri Brain Without Contrast  01/12/2014   CLINICAL DATA:  Slurred speech.  Hypertensive urgency.  EXAM: MRI HEAD WITHOUT CONTRAST  MRA HEAD WITHOUT CONTRAST  TECHNIQUE: Multiplanar, multiecho pulse sequences of the brain and surrounding structures were obtained without intravenous contrast. Angiographic images of the head were obtained using MRA technique without  contrast.  COMPARISON:  CT head without contrast 01/11/2014.  FINDINGS: MRI HEAD FINDINGS  The diffusion-weighted images demonstrate no evidence for acute or subacute infarction. No hemorrhage or mass lesion is present. Mild generalized atrophy is present. Moderate periventricular and scattered subcortical T2 hyperintensities are present bilaterally. Dilated perivascular spaces are noted in the basal ganglia. White matter changes extend into the brainstem.  C1-2 is fused anteriorly. The upper cervical spine is otherwise unremarkable. No flow is evident in the left vertebral artery. Flow is present in the major intracranial arteries otherwise. The patient is status post bilateral lens replacements. The paranasal sinuses and mastoid air cells are clear.  MRA HEAD FINDINGS  Mild irregularity is present through the cavernous carotid arteries bilaterally. A 1 mm downward bulge is noted at the posterior communicating artery bilaterally. The ICA terminus is within normal limits. The A1 segments are normal bilaterally. The anterior communicating artery is patent. There is mild narrowing of the mid right M1 segment. The MCA bifurcations are intact. There is moderate segmental attenuation of MCA branch vessels. No other significant proximal stenosis or occlusion is evident.  The right vertebral artery is dominant. The left vertebral artery is occluded with flow seen only in the very distal segment. The AICA vessels are seen bilaterally. The basilar artery is otherwise normal. Both posterior cerebral arteries originate from the basilar tip. There is a high-grade proximal left PCA stenosis. Moderate attenuation of distal PCA branch vessels is present bilaterally.  IMPRESSION: 1. No acute intracranial abnormality. 2. Mild generalized atrophy and moderate diffuse white matter disease. This likely reflects the sequelae of chronic microvascular ischemia. 3. Atherosclerotic changes within the cavernous carotid arteries bilaterally  without significant stenosis. 4. 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 5. Mild narrowing in the mid right M1 segment. 6. High-grade stenosis in the proximal left posterior cerebral artery.   Electronically Signed   By: Lawrence Santiago M.D.   On: 01/12/2014 09:55   Mr Jodene Nam Head/brain Wo Cm  01/12/2014   CLINICAL DATA:  Slurred speech.  Hypertensive urgency.  EXAM: MRI HEAD WITHOUT CONTRAST  MRA HEAD WITHOUT CONTRAST  TECHNIQUE: Multiplanar, multiecho pulse sequences of the brain and surrounding structures were obtained without intravenous contrast. Angiographic images of the head were obtained using MRA technique without contrast.  COMPARISON:  CT head without contrast 01/11/2014.  FINDINGS: MRI HEAD FINDINGS  The diffusion-weighted images demonstrate no evidence for acute or subacute infarction. No hemorrhage or mass lesion is present. Mild generalized atrophy is present. Moderate periventricular and scattered subcortical T2 hyperintensities are present bilaterally. Dilated perivascular spaces are noted in the basal ganglia. White matter changes extend into the brainstem.  C1-2 is fused anteriorly. The upper cervical spine is otherwise unremarkable. No flow is evident in the left vertebral artery. Flow is present in the major intracranial arteries otherwise. The patient is status post bilateral lens replacements. The paranasal sinuses and mastoid  air cells are clear.  MRA HEAD FINDINGS  Mild irregularity is present through the cavernous carotid arteries bilaterally. A 1 mm downward bulge is noted at the posterior communicating artery bilaterally. The ICA terminus is within normal limits. The A1 segments are normal bilaterally. The anterior communicating artery is patent. There is mild narrowing of the mid right M1 segment. The MCA bifurcations are intact. There is moderate segmental attenuation of MCA branch vessels. No other significant proximal stenosis or  occlusion is evident.  The right vertebral artery is dominant. The left vertebral artery is occluded with flow seen only in the very distal segment. The AICA vessels are seen bilaterally. The basilar artery is otherwise normal. Both posterior cerebral arteries originate from the basilar tip. There is a high-grade proximal left PCA stenosis. Moderate attenuation of distal PCA branch vessels is present bilaterally.  IMPRESSION: 1. No acute intracranial abnormality. 2. Mild generalized atrophy and moderate diffuse white matter disease. This likely reflects the sequelae of chronic microvascular ischemia. 3. Atherosclerotic changes within the cavernous carotid arteries bilaterally without significant stenosis. 4. 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 5. Mild narrowing in the mid right M1 segment. 6. High-grade stenosis in the proximal left posterior cerebral artery.   Electronically Signed   By: Lawrence Santiago M.D.   On: 01/12/2014 09:55    Scheduled Meds: . aspirin  325 mg Oral Daily  . calcitRIOL  0.25 mcg Oral BID  . cloNIDine  0.1 mg Oral TID  . enoxaparin (LOVENOX) injection  30 mg Subcutaneous Q24H  . furosemide  40 mg Oral BID  . hydrALAZINE  25 mg Oral 3 times per day  . insulin aspart  0-5 Units Subcutaneous QHS  . insulin aspart  0-9 Units Subcutaneous TID WC  . insulin detemir  20 Units Subcutaneous QHS  . losartan  50 mg Oral Daily  . polyethylene glycol  17 g Oral Daily  . simvastatin  40 mg Oral q1800   Continuous Infusions:  Antibiotics Given (last 72 hours)   None      Active Problems:   TIA (transient ischemic attack)   Hypertensive urgency   Diabetes mellitus    Time spent: 31min    [name] [name]  [facility] Pager [phone]. If 7PM-7AM, please contact night-coverage at [email or web address], password [email or web address] 01/12/2014, 2:41 PM  LOS: 2 days

## 2014-01-12 NOTE — Progress Notes (Signed)
VASCULAR LAB PRELIMINARY  PRELIMINARY  PRELIMINARY  PRELIMINARY  Carotid Dopplers completed.    Preliminary report:  There is 1-39% ICA stenosis.  Right vertebral artery flow is antegrade.  Left vertebral artery flow is retrograde.  Iantha Fallen, RVT 01/12/2014, 10:23 AM

## 2014-01-13 DIAGNOSIS — N183 Chronic kidney disease, stage 3 unspecified: Secondary | ICD-10-CM | POA: Diagnosis present

## 2014-01-13 LAB — GLUCOSE, CAPILLARY
Glucose-Capillary: 105 mg/dL — ABNORMAL HIGH (ref 70–99)
Glucose-Capillary: 183 mg/dL — ABNORMAL HIGH (ref 70–99)
Glucose-Capillary: 163 mg/dL — ABNORMAL HIGH (ref 70–99)
Glucose-Capillary: 170 mg/dL — ABNORMAL HIGH (ref 70–99)

## 2014-01-13 MED ORDER — CLONIDINE HCL 0.2 MG PO TABS
0.2000 mg | ORAL_TABLET | Freq: Three times a day (TID) | ORAL | Status: DC
  Administered 2014-01-13 – 2014-01-14 (×2): 0.2 mg via ORAL
  Filled 2014-01-13 (×4): qty 1

## 2014-01-13 MED ORDER — HYDRALAZINE HCL 50 MG PO TABS: 50.0000 mg | ORAL_TABLET | Freq: Three times a day (TID) | ORAL | Status: DC

## 2014-01-13 MED ORDER — CLONIDINE HCL 0.1 MG PO TABS
0.1000 mg | ORAL_TABLET | ORAL | Status: AC
  Administered 2014-01-13: 0.1 mg via ORAL
  Filled 2014-01-13: qty 1

## 2014-01-13 MED ORDER — INSULIN DETEMIR 100 UNIT/ML FLEXPEN: 20.0000 [IU] | PEN_INJECTOR | Freq: Every day | SUBCUTANEOUS | Status: DC

## 2014-01-13 MED ORDER — HYDRALAZINE HCL 50 MG PO TABS
50.0000 mg | ORAL_TABLET | Freq: Three times a day (TID) | ORAL | Status: DC
  Administered 2014-01-13 – 2014-01-14 (×4): 50 mg via ORAL
  Filled 2014-01-13 (×6): qty 1

## 2014-01-13 NOTE — Progress Notes (Signed)
Initial Medicare IM signed by patient and signed copy left on chart. Ocie Cornfield [place], RN,BSN [id number]

## 2014-01-13 NOTE — Discharge Summary (Addendum)
Physician Discharge Summary  [name] [name] [id number] DOB: [birth year] DOA: 01/10/2014  PCP: [name] Greenland, MD  Admit date: 01/10/2014 Discharge date: 01/14/2014  Time spent: 45 minutes  Recommendations for Outpatient Follow-up:  1. PCP in 1 week 2. [name] [name] in 1 month  Discharge Diagnoses:  Active Problems:   TIA (transient ischemic attack)   Hypertensive urgency   Diabetes mellitus   Chronic kidney disease 3   Discharge Condition: stable  Diet recommendation: low salt, diabetic  Filed Weights   01/11/14 0507 01/12/14 0400 01/14/14 0402  Weight: 83.416 kg (183 lb 14.4 oz) 82.918 kg (182 lb 12.8 oz) 81.33 kg (179 lb 4.8 oz)    History of present illness:  [name] [name] is a 78 y.o. female  has a past medical history of Diabetes mellitus without complication; Hyperlipidemia; and Hypertension p Presented with  Patient initially was seen in ER for hypertensive urgency and was discharged to home. Later on in the evening she developed slurred speech and family brought her back in. Her blood pressure initially was up to 243/112 no down to 170/88. Patient reports taking all of he medications. Patient has hx of CKD and have been getting iron infusions 2 days ago she could not complete it due to severe HTN and was sent to ER as well.  Patient denies chest pain, no SOB. Patient reports dietary indiscretions and increased blood pressure with activity.    Hospital Course:  TIA/ vs hypertensive encephalopathy  -had some symptoms of word finding difficulty prior to admission which resolved -resolved  -started on ASA 325Mg  daily  -MRI: no CVA, MRA: with atherosclerotic cerebrovascular disease and high grade stenosis if posterior communicating artery -Carotid Dopplers with 1-39% ICA stenosis -ECHO: EF of 60% and grade 1 diastolic dysfunction -LDL 92, hhaic 8.5, continue statin -PT/OT eval completed -Fu with [name] [name] in 60month  . Hypertensive urgency  -BP 240/120 on  admission, slowly improving -started on Hydralazine and dose increased  -increased  clonidine and started on losartan  -Needs close FU of her BP  . Diabetes mellitus  - on lower dose of Levemir now since CBGs were running on lower side during hospitalization   CKD:  -stable   Discharge Exam: Filed Vitals:   01/14/14 1326  BP: 176/82  Pulse:   Temp:   Resp:     General: AAOx3 Cardiovascular: S1S2/RRR Respiratory: CTAB  Discharge Instructions You were cared for by a hospitalist during your hospital stay. If you have any questions about your discharge medications or the care you received while you were in the hospital after you are discharged, you can call the unit and asked to speak with the hospitalist on call if the hospitalist that took care of you is not available. Once you are discharged, your primary care physician will handle any further medical issues. Please note that NO REFILLS for any discharge medications will be authorized once you are discharged, as it is imperative that you return to your primary care physician (or establish a relationship with a primary care physician if you do not have one) for your aftercare needs so that they can reassess your need for medications and monitor your lab values.      Discharge Instructions   Diet - low sodium heart healthy    Complete by:  As directed      Diet Carb Modified    Complete by:  As directed      Increase activity slowly    Complete by:  As  directed      Increase activity slowly    Complete by:  As directed             Medication List         calcitRIOL 0.25 MCG capsule  Commonly known as:  ROCALTROL  Take 0.25 mcg by mouth 2 (two) times daily.     cloNIDine 0.1 MG tablet  Commonly known as:  CATAPRES  Take 2 tablets (0.2 mg total) by mouth 3 (three) times daily.     furosemide 40 MG tablet  Commonly known as:  LASIX  Take 0.5 tablets (20 mg total) by mouth 2 (two) times daily.     hydrALAZINE 50 MG  tablet  Commonly known as:  APRESOLINE  Take 1 tablet (50 mg total) by mouth every 8 (eight) hours.     Insulin Detemir 100 UNIT/ML Pen  Commonly known as:  LEVEMIR FLEXPEN  Inject 20 Units into the skin daily at 10 pm.     losartan 100 MG tablet  Commonly known as:  COZAAR  Take 50 mg by mouth daily.     simvastatin 40 MG tablet  Commonly known as:  ZOCOR  Take 40 mg by mouth.       Allergies  Allergen Reactions  . Penicillins Swelling and Rash   Follow-up Information   Follow up with [name] Greenland, MD. Schedule an appointment as soon as possible for a visit in 1 week.   Specialty:  Internal Medicine   Contact information:   [place] [address] [phone]       Follow up with [name] Cellar, MD In 1 month.   Specialties:  Neurology, Radiology   Contact information:   [address] [place] [address] [phone]        The results of significant diagnostics from this hospitalization (including imaging, microbiology, ancillary and laboratory) are listed below for reference.    Significant Diagnostic Studies: Ct Head Wo Contrast  01/11/2014   CLINICAL DATA:  Hypertension  EXAM: CT HEAD WITHOUT CONTRAST  TECHNIQUE: Contiguous axial images were obtained from the base of the skull through the vertex without intravenous contrast.  COMPARISON:  None.  FINDINGS: The ventricles are normal in size and position. There is minimal age appropriate diffuse cerebral atrophy. There is no intracranial hemorrhage nor evidence of an evolving ischemic event. There is encephalomalacia in the left posterior parietal lobe. There is decreased density in the deep white matter of both cerebral hemispheres consistent with chronic small vessel ischemia.  At bone window settings the observed portions of the paranasal sinuses and mastoid air cells are clear. There is no lytic or blastic bony lesion nor evidence of a skull fracture.  IMPRESSION: 1.  There is no acute intracranial hemorrhage nor other acute intracranial abnormality. 2. 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   Electronically Signed   By: David  Martinique   On: 01/11/2014 01:39   Mri Brain Without Contrast  01/12/2014   CLINICAL DATA:  Slurred speech.  Hypertensive urgency.  EXAM: MRI HEAD WITHOUT CONTRAST  MRA HEAD WITHOUT CONTRAST  TECHNIQUE: Multiplanar, multiecho pulse sequences of the brain and surrounding structures were obtained without intravenous contrast. Angiographic images of the head were obtained using MRA technique without contrast.  COMPARISON:  CT head without contrast 01/11/2014.  FINDINGS: MRI HEAD FINDINGS  The  diffusion-weighted images demonstrate no evidence for acute or subacute infarction. No hemorrhage or mass lesion is present. Mild generalized atrophy is present. Moderate periventricular and scattered subcortical T2 hyperintensities are present bilaterally. Dilated perivascular spaces are noted in the basal ganglia. White matter changes extend into the brainstem.  C1-2 is fused anteriorly. The upper cervical spine is otherwise unremarkable. No flow is evident in the left vertebral artery. Flow is present in the major intracranial arteries otherwise. The patient is status post bilateral lens replacements. The paranasal sinuses and mastoid air cells are clear.  MRA HEAD FINDINGS  Mild irregularity is present through the cavernous carotid arteries bilaterally. A 1 mm downward bulge is noted at the posterior communicating artery bilaterally. The ICA terminus is within normal limits. The A1 segments are normal bilaterally. The anterior communicating artery is patent. There is mild narrowing of the mid right M1 segment. The MCA bifurcations are intact. There is moderate segmental attenuation of MCA branch vessels. No other significant proximal stenosis or  occlusion is evident.  The right vertebral artery is dominant. The left vertebral artery is occluded with flow seen only in the very distal segment. The AICA vessels are seen bilaterally. The basilar artery is otherwise normal. Both posterior cerebral arteries originate from the basilar tip. There is a high-grade proximal left PCA stenosis. Moderate attenuation of distal PCA branch vessels is present bilaterally.  IMPRESSION: 1. No acute intracranial abnormality. 2. Mild generalized atrophy and moderate diffuse white matter disease. This likely reflects the sequelae of chronic microvascular ischemia. 3. Atherosclerotic changes within the cavernous carotid arteries bilaterally without significant stenosis. 4. 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 5. Mild narrowing in the mid right M1 segment. 6. High-grade stenosis in the proximal left posterior cerebral artery.   Electronically Signed   By: Lawrence Santiago M.D.   On: 01/12/2014 09:55   Mr Jodene Nam Head/brain Wo Cm  01/12/2014   CLINICAL DATA:  Slurred speech.  Hypertensive urgency.  EXAM: MRI HEAD WITHOUT CONTRAST  MRA HEAD WITHOUT CONTRAST  TECHNIQUE: Multiplanar, multiecho pulse sequences of the brain and surrounding structures were obtained without intravenous contrast. Angiographic images of the head were obtained using MRA technique without contrast.  COMPARISON:  CT head without contrast 01/11/2014.  FINDINGS: MRI HEAD FINDINGS  The diffusion-weighted images demonstrate no evidence for acute or subacute infarction. No hemorrhage or mass lesion is present. Mild generalized atrophy is present. Moderate periventricular and scattered subcortical T2 hyperintensities are present bilaterally. Dilated perivascular spaces are noted in the basal ganglia. White matter changes extend into the brainstem.  C1-2 is fused anteriorly. The upper cervical spine is otherwise unremarkable. No flow is evident in the left vertebral  artery. Flow is present in the major intracranial arteries otherwise. The patient is status post bilateral lens replacements. The paranasal sinuses and mastoid air cells are clear.  MRA HEAD FINDINGS  Mild irregularity is present through the cavernous carotid arteries bilaterally. A 1 mm downward bulge is noted at the posterior communicating artery bilaterally. The ICA terminus is within normal limits. The A1 segments are normal bilaterally. The anterior communicating artery is patent. There is mild narrowing of the mid right M1 segment. The MCA bifurcations are intact. There is moderate segmental attenuation of MCA branch vessels. No other significant proximal stenosis or occlusion is evident.  The right vertebral artery is dominant. The left vertebral artery is occluded with flow seen only in the very distal segment. The AICA vessels are  seen bilaterally. The basilar artery is otherwise normal. Both posterior cerebral arteries originate from the basilar tip. There is a high-grade proximal left PCA stenosis. Moderate attenuation of distal PCA branch vessels is present bilaterally.  IMPRESSION: 1. No acute intracranial abnormality. 2. Mild generalized atrophy and moderate diffuse white matter disease. This likely reflects the sequelae of chronic microvascular ischemia. 3. Atherosclerotic changes within the cavernous carotid arteries bilaterally without significant stenosis. 4. 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 5. Mild narrowing in the mid right M1 segment. 6. High-grade stenosis in the proximal left posterior cerebral artery.   Electronically Signed   By: Lawrence Santiago M.D.   On: 01/12/2014 09:55    Microbiology: No results found for this or any previous visit (from the past 240 hour(s)).   Labs: Basic Metabolic Panel:  Recent Labs Lab 01/10/14 1506 01/14/14 0415  NA 140 140  K 3.7 3.6*  CL 99 99  CO2 30 27  GLUCOSE 98 132*  BUN 26* 26*   CREATININE 1.96* 2.22*  CALCIUM 9.4 9.0   Liver Function Tests:  Recent Labs Lab 01/10/14 1506  AST 15  ALT 8  ALKPHOS 93  BILITOT 0.4  PROT 7.9  ALBUMIN 3.2*   No results found for this basename: LIPASE, AMYLASE,  in the last 168 hours No results found for this basename: AMMONIA,  in the last 168 hours CBC:  Recent Labs Lab 01/10/14 1506  WBC 5.6  NEUTROABS 3.7  HGB 11.0*  HCT 34.4*  MCV 70.6*  PLT 205   Cardiac Enzymes:  Recent Labs Lab 01/10/14 1506 01/11/14 0343 01/11/14 1035  TROPONINI <0.30 <0.30 <0.30   BNP: BNP (last 3 results) No results found for this basename: PROBNP,  in the last 8760 hours CBG:  Recent Labs Lab 01/13/14 1151 01/13/14 1632 01/13/14 2117 01/14/14 0738 01/14/14 1129  GLUCAP 183* 163* 170* 108* 149*       Signed:  Domenic Polite  [facility] 01/14/2014, 1:30 PM

## 2014-01-13 NOTE — Evaluation (Signed)
Occupational Therapy Evaluation Patient Details Name: [name] [name] MRN: [id number] DOB: May 22, 1936 Today's Date: 01/13/2014    History of Present Illness Patient initially was seen in ER for hypertensive urgency and was discharged to home. Later on in the evening she developed slurred speech and family brought her back in. Her blood pressure initially was up to  243/112 no down to 170/88. Patient reports taking all of he medications. Patient has hx of CKD and have been getting iron infusions 2 days ago she could not complete it due to severe HTN and was sent to ER as well.  Now dx TIA   Clinical Impression   Pt was admitted w/ TIA as above. Pt is currently Mod I functional mobility/transfers and ADL's. She is at baseline level of function and reports no further acute OT needs at this time. Pt's RN was made aware of this & this was also discussed w/ PT as pt has no stairs and is at baseline level of function. Will sign off acute OT.    Follow Up Recommendations  No OT follow up    Equipment Recommendations  None recommended by OT    Recommendations for Other Services       Precautions / Restrictions Precautions Precautions: Fall      Mobility Bed Mobility Overal bed mobility: Modified Independent                Transfers Overall transfer level: Modified independent Equipment used: None             General transfer comment: Pt participated in functional mobility in room and bathroom at Mod I level w/o AD. Pt also reports performing functional mobility in room/bathroom prior to this assessment "I haven't needed any help, I'm fine."    Balance Overall balance assessment: No apparent balance deficits (not formally assessed);Independent                                          ADL Overall ADL's : Modified independent;At baseline  Pt was educated verbally in warning signs/symptoms of stroke & verbalized understanding of this. She participated  in brief ADL session and demonstrated Mod I throughout, she reports no further acute OT needs at this time. Will sign off.                                     General ADL Comments:  (Pt was observed to be at Mod I level for functional mobility/transfers and ADL's. Has no steps/level entry at home. Pt reports she is at baseline level. )     Vision  Wears glasses at all times, has h/o decreased vision in right eye secondary to diabetes (has had surgery in the past).               Additional Comments:  (Pt has h/o decreased vision R eye secondary to diabetes, has had surgery.)   Perception     Praxis      Pertinent Vitals/Pain No pain, pt denies pain.     Hand Dominance Right   Extremity/Trunk Assessment Upper Extremity Assessment Upper Extremity Assessment: Overall WFL for tasks assessed   Lower Extremity Assessment Lower Extremity Assessment: Overall WFL for tasks assessed   Cervical / Trunk Assessment Cervical / Trunk Assessment: Normal   Communication Communication Communication:  No difficulties   Cognition Arousal/Alertness: Awake/alert Behavior During Therapy: WFL for tasks assessed/performed Overall Cognitive Status: Within Functional Limits for tasks assessed                     General Comments       Exercises       Shoulder Instructions      Home Living Family/patient expects to be discharged to:: Private residence (Independent living) Living Arrangements: Spouse/significant other Available Help at Discharge: Family Type of Home: Independent living facility Home Access: Level entry     Home Layout: One level     Bathroom Shower/Tub: Tub/shower unit Shower/tub characteristics: Curtain Biochemist, clinical: Handicapped height     Home Equipment: Environmental consultant - 2 wheels;Cane - single point          Prior Functioning/Environment Level of Independence: Independent with assistive device(s)        Comments: Uses SPC for  distances, does not usually use.    OT Diagnosis:     OT Problem List:     OT Treatment/Interventions:      OT Goals(Current goals can be found in the care plan section) Acute Rehab OT Goals Patient Stated Goal: Go home when able  OT Frequency:         End of Session    Time: 1202-1225 OT Time Calculation (min): 23 min Charges:  OT General Charges $OT Visit: 1 Procedure OT Evaluation $Initial OT Evaluation Tier I: 1 Procedure OT Treatments $Self Care/Home Management : 8-22 mins (15 min) G-Codes:    Yesica Kemler B Alysha Doolan 01/16/14, 12:38 PM

## 2014-01-14 DIAGNOSIS — N189 Chronic kidney disease, unspecified: Secondary | ICD-10-CM

## 2014-01-14 DIAGNOSIS — E119 Type 2 diabetes mellitus without complications: Secondary | ICD-10-CM

## 2014-01-14 DIAGNOSIS — I10 Essential (primary) hypertension: Secondary | ICD-10-CM

## 2014-01-14 LAB — GLUCOSE, CAPILLARY
Glucose-Capillary: 108 mg/dL — ABNORMAL HIGH (ref 70–99)
Glucose-Capillary: 149 mg/dL — ABNORMAL HIGH (ref 70–99)
Glucose-Capillary: 161 mg/dL — ABNORMAL HIGH (ref 70–99)
Glucose-Capillary: 150 mg/dL — ABNORMAL HIGH (ref 70–99)

## 2014-01-14 LAB — BASIC METABOLIC PANEL
BUN: 26 mg/dL — ABNORMAL HIGH (ref 6–23)
CO2: 27 mEq/L (ref 19–32)
Calcium: 9 mg/dL (ref 8.4–10.5)
Chloride: 99 mEq/L (ref 96–112)
Creatinine, Ser: 2.22 mg/dL — ABNORMAL HIGH (ref 0.50–1.10)
GFR calc Af Amer: 24 mL/min — ABNORMAL LOW (ref >90)
GFR calc non Af Amer: 20 mL/min — ABNORMAL LOW (ref >90)
Glucose, Bld: 132 mg/dL — ABNORMAL HIGH (ref 70–99)
Potassium: 3.6 mEq/L — ABNORMAL LOW (ref 3.7–5.3)
Sodium: 140 mEq/L (ref 137–147)

## 2014-01-14 MED ORDER — HYDRALAZINE HCL 50 MG PO TABS: 50.0000 mg | ORAL_TABLET | Freq: Three times a day (TID) | ORAL | Status: DC

## 2014-01-14 MED ORDER — FUROSEMIDE 40 MG PO TABS
40.0000 mg | ORAL_TABLET | Freq: Two times a day (BID) | ORAL | Status: DC
  Administered 2014-01-14 – 2014-01-16 (×4): 40 mg via ORAL
  Filled 2014-01-14 (×6): qty 1

## 2014-01-14 MED ORDER — ATORVASTATIN CALCIUM 20 MG PO TABS
20.0000 mg | ORAL_TABLET | Freq: Every day | ORAL | Status: DC
  Administered 2014-01-15: 20 mg via ORAL
  Filled 2014-01-14 (×2): qty 1

## 2014-01-14 MED ORDER — AMLODIPINE BESYLATE 5 MG PO TABS
5.0000 mg | ORAL_TABLET | Freq: Every day | ORAL | Status: AC
  Administered 2014-01-14: 5 mg via ORAL
  Filled 2014-01-14: qty 1

## 2014-01-14 MED ORDER — FUROSEMIDE 20 MG PO TABS
20.0000 mg | ORAL_TABLET | Freq: Two times a day (BID) | ORAL | Status: DC
  Administered 2014-01-14: 20 mg via ORAL
  Filled 2014-01-14 (×3): qty 1

## 2014-01-14 MED ORDER — AMLODIPINE BESYLATE 10 MG PO TABS
10.0000 mg | ORAL_TABLET | Freq: Every day | ORAL | Status: DC
  Administered 2014-01-15 – 2014-01-16 (×2): 10 mg via ORAL
  Filled 2014-01-14 (×2): qty 1

## 2014-01-14 MED ORDER — CLONIDINE HCL 0.1 MG PO TABS: 0.2000 mg | ORAL_TABLET | Freq: Three times a day (TID) | ORAL | Status: DC

## 2014-01-14 MED ORDER — CLONIDINE HCL 0.3 MG PO TABS
0.3000 mg | ORAL_TABLET | Freq: Three times a day (TID) | ORAL | Status: DC
  Administered 2014-01-14 – 2014-01-16 (×6): 0.3 mg via ORAL
  Filled 2014-01-14 (×8): qty 1

## 2014-01-14 MED ORDER — FUROSEMIDE 40 MG PO TABS: 20.0000 mg | ORAL_TABLET | Freq: Two times a day (BID) | ORAL | Status: DC

## 2014-01-14 NOTE — Consult Note (Signed)
[name] [name] is a 78 y.o. female  has a past medical history of Diabetes mellitus without complication; Hyperlipidemia; and Hypertension.  Presented with  Patient initially was seen in ER for hypertensive urgency and was discharged to home. Later on in the evening she developed slurred speech and family brought her back in. Her blood pressure initially was up to 243/112 no down to 170/88. Patient reports taking all of he medications. Patient has hx of CKD and have been getting iron infusions 2 days ago she could not complete it due to severe HTN and was sent to ER as well.  Patient denies chest pain, no SOB. Patient reports dietary indiscretions and increased blood pressure with activity.  Hospitalist was called for admission for hypertensive urgency and TIA  Past Medical History  Diagnosis Date  . Diabetes mellitus without complication   . Hyperlipidemia   . Hypertension   . CKD (chronic kidney disease) stage 3, GFR 30-59 ml/min    History reviewed. No pertinent past surgical history. Social History:  reports that she has never smoked. She does not have any smokeless tobacco history on file. She reports that she does not drink alcohol or use illicit drugs. Allergies:  Allergies  Allergen Reactions  . Penicillins Swelling and Rash   Family History  Problem Relation Age of Onset  . Hypertension Other   . Hyperlipidemia Other   . Stroke Other   . Diabetes type II Mother   . Hypertension Mother   . Diabetes type II Father     Medications:  Prior to Admission:  Prescriptions prior to admission  Medication Sig Dispense Refill  . calcitRIOL (ROCALTROL) 0.25 MCG capsule Take 0.25 mcg by mouth 2 (two) times daily.       Marland Kitchen losartan (COZAAR) 100 MG tablet Take 50 mg by mouth daily.       . simvastatin (ZOCOR) 40 MG tablet Take 40 mg by mouth.       . [DISCONTINUED] cloNIDine (CATAPRES) 0.1 MG tablet Take 0.1 mg by mouth 3 (three) times daily.       . [DISCONTINUED] furosemide  (LASIX) 40 MG tablet Take 40 mg by mouth 2 (two) times daily.       . [DISCONTINUED] LEVEMIR FLEXPEN 100 UNIT/ML injection Inject 22-25 Units into the skin 2 (two) times daily.        Scheduled: . [START ON 01/15/2014] amLODipine  10 mg Oral Daily  . amLODipine  5 mg Oral Daily  . aspirin  325 mg Oral Daily  . calcitRIOL  0.25 mcg Oral BID  . cloNIDine  0.3 mg Oral TID  . enoxaparin (LOVENOX) injection  30 mg Subcutaneous Q24H  . furosemide  40 mg Oral BID  . insulin aspart  0-5 Units Subcutaneous QHS  . insulin aspart  0-9 Units Subcutaneous TID WC  . insulin detemir  20 Units Subcutaneous QHS  . losartan  50 mg Oral Daily  . polyethylene glycol  17 g Oral Daily  . simvastatin  40 mg Oral q1800   ROS: as per HPI Blood pressure 183/73, pulse 70, temperature 97.6 F (36.4 C), temperature source Oral, resp. rate 16, height 5' 1.5" (1.562 m), weight 81.33 kg (179 lb 4.8 oz), SpO2 96.00%.  General appearance: alert and cooperative Head: Normocephalic, without obvious abnormality, atraumatic Eyes: negative Ears: normal TM's and external ear canals both ears Nose: Nares normal. Septum midline. Mucosa normal. No drainage or sinus tenderness. Throat: lips, mucosa, and tongue normal; teeth and gums  normal Resp: clear to auscultation bilaterally Chest wall: no tenderness Cardio: regular rate and rhythm, S1, S2 normal, no murmur, click, rub or gallop GI: soft, non-tender; bowel sounds normal; no masses,  no organomegaly Extremities: trace to 1+ edema Skin: Skin color, texture, turgor normal. No rashes or lesions Neurologic: Grossly normal  Results for orders placed during the hospital encounter of 01/10/14 (from the past 48 hour(s))  GLUCOSE, CAPILLARY     Status: Abnormal   Collection Time    01/12/14  4:34 PM      Result Value Ref Range   Glucose-Capillary 147 (*) 70 - 99 mg/dL  GLUCOSE, CAPILLARY     Status: Abnormal   Collection Time    01/12/14  9:12 PM      Result Value Ref  Range   Glucose-Capillary 196 (*) 70 - 99 mg/dL  GLUCOSE, CAPILLARY     Status: Abnormal   Collection Time    01/13/14  7:28 AM      Result Value Ref Range   Glucose-Capillary 105 (*) 70 - 99 mg/dL   Comment 1 Documented in Chart     Comment 2 Notify RN    GLUCOSE, CAPILLARY     Status: Abnormal   Collection Time    01/13/14 11:51 AM      Result Value Ref Range   Glucose-Capillary 183 (*) 70 - 99 mg/dL   Comment 1 Documented in Chart     Comment 2 Notify RN    GLUCOSE, CAPILLARY     Status: Abnormal   Collection Time    01/13/14  4:32 PM      Result Value Ref Range   Glucose-Capillary 163 (*) 70 - 99 mg/dL   Comment 1 Documented in Chart     Comment 2 Notify RN    GLUCOSE, CAPILLARY     Status: Abnormal   Collection Time    01/13/14  9:17 PM      Result Value Ref Range   Glucose-Capillary 170 (*) 70 - 99 mg/dL  BASIC METABOLIC PANEL     Status: Abnormal   Collection Time    01/14/14  4:15 AM      Result Value Ref Range   Sodium 140  137 - 147 mEq/L   Potassium 3.6 (*) 3.7 - 5.3 mEq/L   Chloride 99  96 - 112 mEq/L   CO2 27  19 - 32 mEq/L   Glucose, Bld 132 (*) 70 - 99 mg/dL   BUN 26 (*) 6 - 23 mg/dL   Creatinine, Ser 2.22 (*) 0.50 - 1.10 mg/dL   Calcium 9.0  8.4 - 10.5 mg/dL   GFR calc non Af Amer 20 (*) >90 mL/min   GFR calc Af Amer 24 (*) >90 mL/min   Comment: (NOTE)     The eGFR has been calculated using the CKD EPI equation.     This calculation has not been validated in all clinical situations.     eGFR's persistently <90 mL/min signify possible Chronic Kidney     Disease.  GLUCOSE, CAPILLARY     Status: Abnormal   Collection Time    01/14/14  7:38 AM      Result Value Ref Range   Glucose-Capillary 108 (*) 70 - 99 mg/dL   Comment 1 Documented in Chart     Comment 2 Notify RN    GLUCOSE, CAPILLARY     Status: Abnormal   Collection Time    01/14/14 11:29 AM  Result Value Ref Range   Glucose-Capillary 149 (*) 70 - 99 mg/dL   Comment 1 Documented in  Chart     Comment 2 Notify RN     No results found.  Assessment:  1 Hypertension, severe 2 Stage 3 CKD, presumed due to DM 3 Plan: 1 Increase clonidine and furosemide  2 Stop hydralazine and change to amlodipine for compliance 3 BS and BP control discussed. 4 F/U renal art duplex  Estanislado Emms 01/14/2014, 3:54 PM

## 2014-01-14 NOTE — Progress Notes (Addendum)
[facility] PROGRESS NOTE  [name] [name] [id number] DOB: Jun 28, 1936 DOA: 01/10/2014 PCP: Maximino Greenland, MD  Brief narrative: [name] [name] is a 78 y.o. female with history of Diabetes mellitus, HTN uncontrolled, CKD3, Hyperlipidemia, initially  was seen in ER for hypertensive urgency and was discharged to home. Later on in the evening she developed slurred speech and family brought her back in. Her blood pressure initially was up to 243/112.Patient reports taking all of he medications. BP has been fluctuating some, increased clonidine, added and increased hydralazine and given PRN meds. CVA workup negative. Still with high BP Renal consulted today  Assessment/Plan: Present on Admission:  . 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 Hypertensive urgency  -now on clonidine 0.2mg  TID, Hydralazine 50mg  TID, Losartan, lasix -hydralazine PRN -Given how BP up again, in 200s, was better this am -will ask Renal to evaluate -check Renal arterial duplex, renin -aldosterone  . Diabetes mellitus  - continue home dose of Levemir and SSI   CKD: -creatinine 1.9-2 at baseline -close to baseline now  Prophylaxis: lovenox  Code Status: Full Code Family Communication: none at bedside Disposition Plan: home when stable   HPI/Subjective: Feels ok, BP still high, no slurring or other neuro symptoms  Objective: Filed Vitals:   01/14/14 1326  BP: 176/82  Pulse:   Temp:   Resp:     Intake/Output Summary (Last 24 hours) at 01/14/14 1446 Last data filed at 01/14/14 0900  Gross per  24 hour  Intake    360 ml  Output    500 ml  Net   -140 ml   Filed Weights   01/11/14 0507 01/12/14 0400 01/14/14 0402  Weight: 83.416 kg (183 lb 14.4 oz) 82.918 kg (182 lb 12.8 oz) 81.33 kg (179 lb 4.8 oz)    Exam:   General:  AAOx3, no distress  HEENT: PERRLA, EOMI,   Cardiovascular: S1S2/RRR  Respiratory: CTAB  Abdomen: soft, Nt, BS present  Musculoskeletal: no edema c/c   Neuro: non focal  Data Reviewed: Basic Metabolic Panel:  Recent Labs Lab 01/10/14 1506 01/14/14 0415  NA 140 140  K 3.7 3.6*  CL 99 99  CO2 30 27  GLUCOSE 98 132*  BUN 26* 26*  CREATININE 1.96* 2.22*  CALCIUM 9.4 9.0   Liver Function Tests:  Recent Labs Lab 01/10/14 1506  AST 15  ALT 8  ALKPHOS 93  BILITOT 0.4  PROT 7.9  ALBUMIN 3.2*   No results found for this basename: LIPASE, AMYLASE,  in the last 168 hours No results found for this basename: AMMONIA,  in the last 168 hours CBC:  Recent Labs Lab 01/10/14 1506  WBC 5.6  NEUTROABS 3.7  HGB 11.0*  HCT 34.4*  MCV 70.6*  PLT 205   Cardiac Enzymes:  Recent Labs Lab 01/10/14 1506 01/11/14 0343 01/11/14 1035  TROPONINI <0.30 <0.30 <0.30   BNP (last 3 results) No results found for this basename: PROBNP,  in the last 8760 hours CBG:  Recent Labs Lab 01/13/14 1151 01/13/14 1632 01/13/14 2117 01/14/14 0738 01/14/14 1129  GLUCAP 183* 163* 170* 108* 149*    No results  found for this or any previous visit (from the past 240 hour(s)).   Studies: No results found.  Scheduled Meds: . aspirin  325 mg Oral Daily  . calcitRIOL  0.25 mcg Oral BID  . cloNIDine  0.2 mg Oral TID  . enoxaparin (LOVENOX) injection  30 mg Subcutaneous Q24H  . furosemide  20 mg Oral BID  . hydrALAZINE  50 mg Oral 3 times per day  . insulin aspart  0-5 Units Subcutaneous QHS  . insulin aspart  0-9 Units Subcutaneous TID WC  . insulin detemir  20 Units Subcutaneous QHS  . losartan  50 mg Oral Daily  . polyethylene glycol  17 g Oral  Daily  . simvastatin  40 mg Oral q1800   Continuous Infusions:  Antibiotics Given (last 72 hours)   None      Active Problems:   TIA (transient ischemic attack)   Hypertensive urgency   Diabetes mellitus   Chronic kidney disease    Time spent: 7min    Domenic Polite  [facility] Pager [phone]. If 7PM-7AM, please contact night-coverage at [email or web address], password [facility] 01/14/2014, 2:46 PM  LOS: 4 days

## 2014-01-15 DIAGNOSIS — I10 Essential (primary) hypertension: Secondary | ICD-10-CM

## 2014-01-15 LAB — GLUCOSE, CAPILLARY
Glucose-Capillary: 106 mg/dL — ABNORMAL HIGH (ref 70–99)
Glucose-Capillary: 97 mg/dL (ref 70–99)
Glucose-Capillary: 82 mg/dL (ref 70–99)
Glucose-Capillary: 142 mg/dL — ABNORMAL HIGH (ref 70–99)

## 2014-01-15 MED ORDER — POTASSIUM CHLORIDE CRYS ER 20 MEQ PO TBCR
40.0000 meq | EXTENDED_RELEASE_TABLET | Freq: Once | ORAL | Status: AC
  Administered 2014-01-15: 40 meq via ORAL
  Filled 2014-01-15: qty 2

## 2014-01-15 MED ORDER — FUROSEMIDE 80 MG PO TABS
80.0000 mg | ORAL_TABLET | Freq: Once | ORAL | Status: AC
  Administered 2014-01-15: 80 mg via ORAL
  Filled 2014-01-15: qty 1

## 2014-01-15 NOTE — Care Management Note (Signed)
    Page 1 of 1   01/16/2014     12:01:26 PM CARE MANAGEMENT NOTE 01/16/2014  Patient:  [name] [name], [name] [name]   Account Number:  [id number]  Date Initiated:  01/15/2014  Documentation initiated by:  GRAVES-BIGELOW,Jrake Rodriquez  Subjective/Objective Assessment:   Pt admitted for Hypertensive urgency, TIA symptoms.     Action/Plan:   CM will continue to monitor for disposition needs.   Anticipated DC Date:  01/16/2014   Anticipated DC Plan:  [place]  CM consult      [facility] Choice  HOME HEALTH   Choice offered to / List presented to:  C-1 Patient        [place] arranged  [place].   Status of service:  Completed, signed off Medicare Important Message given?  YES (If response is "NO", the following Medicare IM given date fields will be blank) Date Medicare IM given:  01/13/2014 Date Additional Medicare IM given:  01/14/2014  Discharge Disposition:  [place]  Per UR Regulation:  Reviewed for med. necessity/level of care/duration of stay  If discussed at [place] of Stay Meetings, dates discussed:   01/16/2014    Comments:  01-16-14 [address], [place] [phone] CM did make referral with [facility] for HHPT/OT services. SOC to begin within 24-48 hrs post d/c.

## 2014-01-15 NOTE — Progress Notes (Signed)
Assessment:  1 Hypertension, severe  2 Stage 3 CKD, presumed due to DM  3  Plan:  1 F/U renal art duplex 2 One extra dose of furosemide PO  Subjective: Interval History: Feels better   Objective: Vital signs in last 24 hours: Temp:  [97.6 F (36.4 C)-98.8 F (37.1 C)] 98.4 F (36.9 C) (06/10 0740) Pulse Rate:  [70-99] 83 (06/10 0740) Resp:  [16-18] 18 (06/10 0740) BP: (151-218)/(70-91) 170/78 mmHg (06/10 0740) SpO2:  [92 %-100 %] 92 % (06/10 0740) Weight:  [81 kg (178 lb 9.2 oz)] 81 kg (178 lb 9.2 oz) (06/10 0422) Weight change: -0.33 kg (-11.6 oz)  Intake/Output from previous day: 06/09 0701 - 06/10 0700 In: 1080 [P.O.:1080] Out: -  Intake/Output this shift:    General appearance: alert and cooperative Resp: clear to auscultation bilaterally Chest wall: no tenderness Cardio: regular rate and rhythm, S1, S2 normal, no murmur, click, rub or gallop Extremities: edema 1-2+  Lab Results: No results found for this basename: WBC, HGB, HCT, PLT,  in the last 72 hours BMET:  Recent Labs  01/14/14 0415  NA 140  K 3.6*  CL 99  CO2 27  GLUCOSE 132*  BUN 26*  CREATININE 2.22*  CALCIUM 9.0   No results found for this basename: PTH,  in the last 72 hours Iron Studies: No results found for this basename: IRON, TIBC, TRANSFERRIN, FERRITIN,  in the last 72 hours Studies/Results: No results found.  Scheduled: . amLODipine  10 mg Oral Daily  . aspirin  325 mg Oral Daily  . atorvastatin  20 mg Oral q1800  . calcitRIOL  0.25 mcg Oral BID  . cloNIDine  0.3 mg Oral TID  . enoxaparin (LOVENOX) injection  30 mg Subcutaneous Q24H  . furosemide  40 mg Oral BID  . insulin aspart  0-5 Units Subcutaneous QHS  . insulin aspart  0-9 Units Subcutaneous TID WC  . insulin detemir  20 Units Subcutaneous QHS  . losartan  50 mg Oral Daily  . polyethylene glycol  17 g Oral Daily     LOS: 5 days   [name] [name] C 01/15/2014,11:21 AM

## 2014-01-15 NOTE — Progress Notes (Signed)
[facility] PROGRESS NOTE  [name] [id number] DOB: March 18, 1936 DOA: 01/10/2014 PCP: Maximino Greenland, MD  Brief narrative: [name] [name] is a 78 y.o. female with history of Diabetes mellitus, HTN uncontrolled, CKD3, Hyperlipidemia, initially  was seen in ER for hypertensive urgency and was discharged to home. Later on in the evening she developed slurred speech and family brought her back in. Her blood pressure initially was up to 243/112.Patient reports taking all of he medications. BP has been fluctuating some, increased clonidine, added and increased hydralazine and given PRN meds. CVA workup negative. Still with high BP Renal consulted today  Assessment/Plan: Present on Admission:  . 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 Hypertensive urgency  -initally on clonidine 0.2mg  TID, Hydralazine 50mg  TID, Losartan, lasix -hydralazine PRN -Given how BP up again, in 200s, was better this am -Renal following with lasix and increased clonidine to 0.3mg  tid -check Renal arterial duplex pending, renin -aldosterone pending -BP this AM remains stable, not optimally controlled  . Diabetes mellitus  - continue home dose of Levemir and SSI - BS stable  CKD: -creatinine 1.9-2 at baseline -close to baseline now  Prophylaxis: lovenox  Code Status: Full Code Family Communication: none at bedside Disposition Plan: home when stable   HPI/Subjective: No complaints. Feels better today  Objective: Filed Vitals:   01/15/14 0740  BP: 170/78  Pulse: 83  Temp: 98.4 F (36.9 C)   Resp: 18    Intake/Output Summary (Last 24 hours) at 01/15/14 0811 Last data filed at 01/14/14 1700  Gross per 24 hour  Intake   1080 ml  Output      0 ml  Net   1080 ml   Filed Weights   01/12/14 0400 01/14/14 0402 01/15/14 0422  Weight: 182 lb 12.8 oz (82.918 kg) 179 lb 4.8 oz (81.33 kg) 178 lb 9.2 oz (81 kg)    Exam:   General:  AAOx3, no distress  HEENT: PERRLA, EOMI,   Cardiovascular: S1S2/RRR  Respiratory: CTAB  Abdomen: soft, Nt, BS present  Musculoskeletal: no edema c/c   Neuro: non focal  Data Reviewed: Basic Metabolic Panel:  Recent Labs Lab 01/10/14 1506 01/14/14 0415  NA 140 140  K 3.7 3.6*  CL 99 99  CO2 30 27  GLUCOSE 98 132*  BUN 26* 26*  CREATININE 1.96* 2.22*  CALCIUM 9.4 9.0   Liver Function Tests:  Recent Labs Lab 01/10/14 1506  AST 15  ALT 8  ALKPHOS 93  BILITOT 0.4  PROT 7.9  ALBUMIN 3.2*   No results found for this basename: LIPASE, AMYLASE,  in the last 168 hours No results found for this basename: AMMONIA,  in the last 168 hours CBC:  Recent Labs Lab 01/10/14 1506  WBC 5.6  NEUTROABS 3.7  HGB 11.0*  HCT 34.4*  MCV 70.6*  PLT 205   Cardiac Enzymes:  Recent Labs Lab 01/10/14 1506 01/11/14 0343 01/11/14 1035  TROPONINI <0.30 <0.30 <0.30   BNP (last 3 results) No results found for this basename: PROBNP,  in the last 8760 hours CBG:  Recent Labs Lab 01/14/14 0738 01/14/14 1129 01/14/14 1610 01/14/14  2102 01/15/14 0738  GLUCAP 108* 149* 161* 150* 106*    No results found for this or any previous visit (from the past 240 hour(s)).   Studies: No results found.  Scheduled Meds: . amLODipine  10 mg Oral Daily  . aspirin  325 mg Oral Daily  . atorvastatin  20 mg Oral q1800  . calcitRIOL  0.25 mcg Oral BID  . cloNIDine  0.3 mg Oral TID  . enoxaparin (LOVENOX) injection  30 mg Subcutaneous Q24H  . furosemide  40 mg Oral BID  . insulin aspart  0-5 Units Subcutaneous QHS  . insulin aspart  0-9  Units Subcutaneous TID WC  . insulin detemir  20 Units Subcutaneous QHS  . losartan  50 mg Oral Daily  . polyethylene glycol  17 g Oral Daily   Continuous Infusions:  Antibiotics Given (last 72 hours)   None      Active Problems:   TIA (transient ischemic attack)   Hypertensive urgency   Diabetes mellitus   Chronic kidney disease  Time spent: 60min  [name] [name], [facility] Pager [phone]. If 7PM-7AM, please contact night-coverage at [email or web address], password [facility] 01/15/2014, 8:11 AM  LOS: 5 days

## 2014-01-15 NOTE — Progress Notes (Signed)
*  PRELIMINARY RESULTS* Vascular Ultrasound Renal Artery Duplex has been completed.  Preliminary findings: no evidence of renal artery stenosis.   Landry Mellow, RDMS, RVT  01/15/2014, 8:20 AM

## 2014-01-16 LAB — BASIC METABOLIC PANEL
BUN: 25 mg/dL — ABNORMAL HIGH (ref 6–23)
CO2: 27 mEq/L (ref 19–32)
Calcium: 9.2 mg/dL (ref 8.4–10.5)
Chloride: 98 mEq/L (ref 96–112)
Creatinine, Ser: 2.16 mg/dL — ABNORMAL HIGH (ref 0.50–1.10)
GFR calc Af Amer: 24 mL/min — ABNORMAL LOW (ref >90)
GFR calc non Af Amer: 21 mL/min — ABNORMAL LOW (ref >90)
Glucose, Bld: 100 mg/dL — ABNORMAL HIGH (ref 70–99)
Potassium: 4.1 mEq/L (ref 3.7–5.3)
Sodium: 140 mEq/L (ref 137–147)

## 2014-01-16 LAB — GLUCOSE, CAPILLARY
Glucose-Capillary: 104 mg/dL — ABNORMAL HIGH (ref 70–99)
Glucose-Capillary: 57 mg/dL — ABNORMAL LOW (ref 70–99)
Glucose-Capillary: 104 mg/dL — ABNORMAL HIGH (ref 70–99)
Glucose-Capillary: 138 mg/dL — ABNORMAL HIGH (ref 70–99)

## 2014-01-16 MED ORDER — CLONIDINE HCL 0.3 MG PO TABS: 0.3000 mg | ORAL_TABLET | Freq: Three times a day (TID) | ORAL | Status: DC

## 2014-01-16 MED ORDER — LOSARTAN POTASSIUM 50 MG PO TABS: 50.0000 mg | ORAL_TABLET | Freq: Every day | ORAL | Status: DC

## 2014-01-16 MED ORDER — FUROSEMIDE 40 MG PO TABS: 40.0000 mg | ORAL_TABLET | Freq: Two times a day (BID) | ORAL | Status: DC

## 2014-01-16 MED ORDER — AMLODIPINE BESYLATE 10 MG PO TABS: 10.0000 mg | ORAL_TABLET | Freq: Every day | ORAL | Status: DC

## 2014-01-16 NOTE — Discharge Instructions (Signed)

## 2014-01-16 NOTE — Discharge Summary (Signed)
Physician Discharge Summary  [name] [name] [id number] DOB: Aug 16, 1935 DOA: 01/10/2014  PCP: Maximino Greenland, MD  Admit date: 01/10/2014 Discharge date: 01/16/2014  Time spent: 40 minutes  Recommendations for Outpatient Follow-up:  1. Follow up with PCP in 1-2 weeks 2. Follow up with Dr. Clover Mealy in 1-2 weeks  Discharge Diagnoses:  Active Problems:   TIA (transient ischemic attack)   Hypertensive urgency   Diabetes mellitus   Chronic kidney disease   Discharge Condition: Improved  Diet recommendation: Heart healthy, diabetic  Filed Weights   01/12/14 0400 01/14/14 0402 01/15/14 0422  Weight: 182 lb 12.8 oz (82.918 kg) 179 lb 4.8 oz (81.33 kg) 178 lb 9.2 oz (81 kg)    History of present illness:  [name] [name] is a 78 y.o. female  has a past medical history of Diabetes mellitus without complication; Hyperlipidemia; and Hypertension.  Presented with  Patient initially was seen in ER for hypertensive urgency and was discharged to home. Later on in the evening she developed slurred speech and family brought her back in. Her blood pressure initially was up to 243/112 no down to 170/88. Patient reports taking all of he medications. Patient has hx of CKD and have been getting iron infusions 2 days ago she could not complete it due to severe HTN and was sent to ER as well.  Patient denies chest pain, no SOB. Patient reports dietary indiscretions and increased blood pressure with activity.  Hospitalist was called for admission for hypertensive urgency and TIA  Hospital Course:  Present on Admission:  . TIA/ vs hypertensive encephalopathy  -resolved, had some word finding difficulty on admission  -continue ASA 325Mg  daily  -MRI: no CVA, MRA: with atherosclerotic cerebrovascular disease and high grade stenosis if posterior communicating artery  -Carotid Dopplers with 1-39% ICA stenosis  -ECHO: EF of 60% and grade 1 diastolic dysfunction  -LDL 92, hhaic 8.5, continue  statin  -PT/OT eval completed  -Fu with Dr.Sethi in 75month   . Hypertensive urgency  -initally on clonidine 0.2mg  TID, Hydralazine 50mg  TID, Losartan, lasix  -hydralazine PRN  -Renal following with lasix and increased clonidine to 0.3mg  tid  -Renal arterial duplex w/o evidence of RAS, renin -aldosterone pending  -BP this AM remains stable, albeit suboptimally controlled   . Diabetes mellitus  - continued home dose of Levemir and SSI  - BS stable   CKD:  -creatinine 1.9-2 at baseline  -close to baseline now  -slightly improved from overnight this AM  Prophylaxis: lovenox  Consultations:  Nephrology  Discharge Exam: Filed Vitals:   01/15/14 2052 01/16/14 0100 01/16/14 0440 01/16/14 0902  BP: 154/83 161/74 176/86 166/68  Pulse: 77 78 80 82  Temp: 97.4 F (36.3 C) 97.8 F (36.6 C) 98.1 F (36.7 C) 98.4 F (36.9 C)  TempSrc: Oral Oral Oral Oral  Resp: 18 18 18 18   Height:      Weight:      SpO2: 100% 100% 95% 92%    General: Awake, in nad Cardiovascular: Regular, s1, s2 Respiratory: normal resp effort, no wheezing  Discharge Instructions      Discharge Instructions   Diet - low sodium heart healthy    Complete by:  As directed      Diet Carb Modified    Complete by:  As directed      Increase activity slowly    Complete by:  As directed      Increase activity slowly    Complete by:  As directed  Medication List         amLODipine 10 MG tablet  Commonly known as:  NORVASC  Take 1 tablet (10 mg total) by mouth daily.     calcitRIOL 0.25 MCG capsule  Commonly known as:  ROCALTROL  Take 0.25 mcg by mouth 2 (two) times daily.     cloNIDine 0.3 MG tablet  Commonly known as:  CATAPRES  Take 1 tablet (0.3 mg total) by mouth 3 (three) times daily.     furosemide 40 MG tablet  Commonly known as:  LASIX  Take 1 tablet (40 mg total) by mouth 2 (two) times daily.     hydrALAZINE 50 MG tablet  Commonly known as:  APRESOLINE  Take 1 tablet  (50 mg total) by mouth every 8 (eight) hours.     Insulin Detemir 100 UNIT/ML Pen  Commonly known as:  LEVEMIR FLEXPEN  Inject 20 Units into the skin daily at 10 pm.     losartan 50 MG tablet  Commonly known as:  COZAAR  Take 1 tablet (50 mg total) by mouth daily.     simvastatin 40 MG tablet  Commonly known as:  ZOCOR  Take 40 mg by mouth.       Allergies  Allergen Reactions  . Penicillins Swelling and Rash   Follow-up Information   Follow up with Maximino Greenland, MD. Schedule an appointment as soon as possible for a visit in 1 week.   Specialty:  Internal Medicine   Contact information:   [place] [address] [phone]       Follow up with Forbes Cellar, MD In 1 month.   Specialties:  Neurology, Radiology   Contact information:   [address] [name] [address] [phone]       Follow up with GOLDSBOROUGH,KELLIE A, MD. Schedule an appointment as soon as possible for a visit in 1 week.   Specialty:  Nephrology   Contact information:   [place] [address] [phone]        The results of significant diagnostics from this hospitalization (including imaging, microbiology, ancillary and laboratory) are listed below for reference.    Significant Diagnostic Studies: Ct Head Wo Contrast  01/11/2014   CLINICAL DATA:  Hypertension  EXAM: CT HEAD WITHOUT CONTRAST  TECHNIQUE: Contiguous axial images were obtained from the base of the skull through the vertex without intravenous contrast.  COMPARISON:  None.  FINDINGS: The ventricles are normal in size and position. There is minimal age appropriate diffuse cerebral atrophy. There is no intracranial hemorrhage nor evidence of an evolving ischemic event. There is encephalomalacia in the left posterior parietal lobe. There is decreased density in the deep white matter of both cerebral hemispheres consistent with chronic small vessel ischemia.  At bone window  settings the observed portions of the paranasal sinuses and mastoid air cells are clear. There is no lytic or blastic bony lesion nor evidence of a skull fracture.  IMPRESSION: 1. There is no acute intracranial hemorrhage nor other acute intracranial abnormality. 2. 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   Electronically Signed   By: David  Martinique   On: 01/11/2014 01:39   Mri Brain Without Contrast  01/12/2014   CLINICAL DATA:  Slurred speech.  Hypertensive urgency.  EXAM: MRI HEAD WITHOUT CONTRAST  MRA HEAD WITHOUT CONTRAST  TECHNIQUE: Multiplanar,  multiecho pulse sequences of the brain and surrounding structures were obtained without intravenous contrast. Angiographic images of the head were obtained using MRA technique without contrast.  COMPARISON:  CT head without contrast 01/11/2014.  FINDINGS: MRI HEAD FINDINGS  The diffusion-weighted images demonstrate no evidence for acute or subacute infarction. No hemorrhage or mass lesion is present. Mild generalized atrophy is present. Moderate periventricular and scattered subcortical T2 hyperintensities are present bilaterally. Dilated perivascular spaces are noted in the basal ganglia. White matter changes extend into the brainstem.  C1-2 is fused anteriorly. The upper cervical spine is otherwise unremarkable. No flow is evident in the left vertebral artery. Flow is present in the major intracranial arteries otherwise. The patient is status post bilateral lens replacements. The paranasal sinuses and mastoid air cells are clear.  MRA HEAD FINDINGS  Mild irregularity is present through the cavernous carotid arteries bilaterally. A 1 mm downward bulge is noted at the posterior communicating artery bilaterally. The ICA terminus is within normal limits. The A1 segments are normal bilaterally. The anterior communicating artery is patent. There is mild  narrowing of the mid right M1 segment. The MCA bifurcations are intact. There is moderate segmental attenuation of MCA branch vessels. No other significant proximal stenosis or occlusion is evident.  The right vertebral artery is dominant. The left vertebral artery is occluded with flow seen only in the very distal segment. The AICA vessels are seen bilaterally. The basilar artery is otherwise normal. Both posterior cerebral arteries originate from the basilar tip. There is a high-grade proximal left PCA stenosis. Moderate attenuation of distal PCA branch vessels is present bilaterally.  IMPRESSION: 1. No acute intracranial abnormality. 2. Mild generalized atrophy and moderate diffuse white matter disease. This likely reflects the sequelae of chronic microvascular ischemia. 3. Atherosclerotic changes within the cavernous carotid arteries bilaterally without significant stenosis. 4. 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 5. Mild narrowing in the mid right M1 segment. 6. High-grade stenosis in the proximal left posterior cerebral artery.   Electronically Signed   By: Lawrence Santiago M.D.   On: 01/12/2014 09:55   Mr Jodene Nam Head/brain Wo Cm  01/12/2014   CLINICAL DATA:  Slurred speech.  Hypertensive urgency.  EXAM: MRI HEAD WITHOUT CONTRAST  MRA HEAD WITHOUT CONTRAST  TECHNIQUE: Multiplanar, multiecho pulse sequences of the brain and surrounding structures were obtained without intravenous contrast. Angiographic images of the head were obtained using MRA technique without contrast.  COMPARISON:  CT head without contrast 01/11/2014.  FINDINGS: MRI HEAD FINDINGS  The diffusion-weighted images demonstrate no evidence for acute or subacute infarction. No hemorrhage or mass lesion is present. Mild generalized atrophy is present. Moderate periventricular and scattered subcortical T2 hyperintensities are present bilaterally. Dilated perivascular spaces are noted in the basal  ganglia. White matter changes extend into the brainstem.  C1-2 is fused anteriorly. The upper cervical spine is otherwise unremarkable. No flow is evident in the left vertebral artery. Flow is present in the major intracranial arteries otherwise. The patient is status post bilateral lens replacements. The paranasal sinuses and mastoid air cells are clear.  MRA HEAD FINDINGS  Mild irregularity is present through the cavernous carotid arteries bilaterally. A 1 mm downward bulge is noted at the posterior communicating artery bilaterally. The ICA terminus is within normal limits. The A1 segments are normal bilaterally. The anterior communicating artery is patent. There is mild narrowing of the mid right M1 segment. The MCA bifurcations are intact. There is moderate  segmental attenuation of MCA branch vessels. No other significant proximal stenosis or occlusion is evident.  The right vertebral artery is dominant. The left vertebral artery is occluded with flow seen only in the very distal segment. The AICA vessels are seen bilaterally. The basilar artery is otherwise normal. Both posterior cerebral arteries originate from the basilar tip. There is a high-grade proximal left PCA stenosis. Moderate attenuation of distal PCA branch vessels is present bilaterally.  IMPRESSION: 1. No acute intracranial abnormality. 2. Mild generalized atrophy and moderate diffuse white matter disease. This likely reflects the sequelae of chronic microvascular ischemia. 3. Atherosclerotic changes within the cavernous carotid arteries bilaterally without significant stenosis. 4. 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 5. Mild narrowing in the mid right M1 segment. 6. High-grade stenosis in the proximal left posterior cerebral artery.   Electronically Signed   By: Lawrence Santiago M.D.   On: 01/12/2014 09:55    Microbiology: No results found for this or any previous visit (from the past 240  hour(s)).   Labs: Basic Metabolic Panel:  Recent Labs Lab 01/10/14 1506 01/14/14 0415 01/16/14 0520  NA 140 140 140  K 3.7 3.6* 4.1  CL 99 99 98  CO2 30 27 27   GLUCOSE 98 132* 100*  BUN 26* 26* 25*  CREATININE 1.96* 2.22* 2.16*  CALCIUM 9.4 9.0 9.2   Liver Function Tests:  Recent Labs Lab 01/10/14 1506  AST 15  ALT 8  ALKPHOS 93  BILITOT 0.4  PROT 7.9  ALBUMIN 3.2*   No results found for this basename: LIPASE, AMYLASE,  in the last 168 hours No results found for this basename: AMMONIA,  in the last 168 hours CBC:  Recent Labs Lab 01/10/14 1506  WBC 5.6  NEUTROABS 3.7  HGB 11.0*  HCT 34.4*  MCV 70.6*  PLT 205   Cardiac Enzymes:  Recent Labs Lab 01/10/14 1506 01/11/14 0343 01/11/14 1035  TROPONINI <0.30 <0.30 <0.30   BNP: BNP (last 3 results) No results found for this basename: PROBNP,  in the last 8760 hours CBG:  Recent Labs Lab 01/15/14 1644 01/15/14 2051 01/16/14 0306 01/16/14 0327 01/16/14 0737  GLUCAP 82 142* 57* 104* 104*   Signed:  CHIU, STEPHEN K  [facility] 01/16/2014, 11:21 AM

## 2014-01-16 NOTE — Progress Notes (Signed)
Hypoglycemic Event  CBG: 57  Treatment: 15 GM carbohydrate snack  Symptoms: Sweaty, hot   Follow-up CBG: Time:0325 CBG Result:104  Possible Reasons for Event: Inadequate meal intake  Comments/MD notified: Pt says this happens at home all the time during the night, she just wakes up and drinks juice. Will continue to monitor pt.     [place], [place]

## 2014-01-16 NOTE — Progress Notes (Signed)
Assessment:  1 Hypertension, improved but not at goal 2 Stage 3 CKD, presumed due to DM  3  Plan:  1)  Cont current med, f/ with Dr. Moshe Cipro who is her nephrologist(she just told me). 2) I will sign off.  Subjective: Interval History: has no complaint of not sleeping well. No renal artery stenosis  Objective: Vital signs in last 24 hours: Temp:  [97.4 F (36.3 C)-98.6 F (37 C)] 98.4 F (36.9 C) (06/11 0902) Pulse Rate:  [77-82] 82 (06/11 0902) Resp:  [18] 18 (06/11 0902) BP: (154-176)/(66-86) 166/68 mmHg (06/11 0902) SpO2:  [91 %-100 %] 92 % (06/11 0902) Weight change:   Intake/Output from previous day:   Intake/Output this shift: Total I/O In: 240 [P.O.:240] Out: -   General appearance: alert Back: negative, symmetric, no curvature. ROM normal. No CVA tenderness. Resp: clear to auscultation bilaterally Chest wall: no tenderness Cardio: regular rate and rhythm, S1, S2 normal, no murmur, click, rub or gallop Extremities: edema tr  Lab Results: No results found for this basename: WBC, HGB, HCT, PLT,  in the last 72 hours BMET:  Recent Labs  01/14/14 0415 01/16/14 0520  NA 140 140  K 3.6* 4.1  CL 99 98  CO2 27 27  GLUCOSE 132* 100*  BUN 26* 25*  CREATININE 2.22* 2.16*  CALCIUM 9.0 9.2   No results found for this basename: PTH,  in the last 72 hours Iron Studies: No results found for this basename: IRON, TIBC, TRANSFERRIN, FERRITIN,  in the last 72 hours Studies/Results: No results found.  Scheduled: . amLODipine  10 mg Oral Daily  . aspirin  325 mg Oral Daily  . atorvastatin  20 mg Oral q1800  . calcitRIOL  0.25 mcg Oral BID  . cloNIDine  0.3 mg Oral TID  . enoxaparin (LOVENOX) injection  30 mg Subcutaneous Q24H  . furosemide  40 mg Oral BID  . insulin aspart  0-5 Units Subcutaneous QHS  . insulin aspart  0-9 Units Subcutaneous TID WC  . insulin detemir  20 Units Subcutaneous QHS  . losartan  50 mg Oral Daily  . polyethylene glycol  17 g Oral  Daily     LOS: 6 days   Tiawanna Luchsinger C 01/16/2014,10:13 AM

## 2014-01-16 NOTE — Progress Notes (Signed)
Results for KORENA, HEBRON (MRN [id number]) as of 01/16/2014 09:58  Ref. Range 01/15/2014 20:51 01/16/2014 03:06 01/16/2014 03:27 01/16/2014 05:20 01/16/2014 07:37  Glucose-Capillary Latest Range: 70-99 mg/dL 142 (H) 57 (L) 104 (H)  104 (H)  CBGs less than 100mg /dl.  Low blood sugar at 03:00.  Recommend decreasing Lantus to 15 units daily and continue Novolog SENSITIVE correction scale AC & HS.  Will follow.  Harvel Ricks RN BSN CDE

## 2014-01-17 ENCOUNTER — Emergency Department (HOSPITAL_COMMUNITY)
Admission: EM | Admit: 2014-01-17 | Discharge: 2014-01-17 | Disposition: A | Discharge status: Home / Self Care | Payer: Medicare Other | Attending: Emergency Medicine | Admitting: Emergency Medicine

## 2014-01-17 ENCOUNTER — Emergency Department (HOSPITAL_COMMUNITY): Payer: Medicare Other

## 2014-01-17 ENCOUNTER — Encounter (HOSPITAL_COMMUNITY): Payer: Self-pay | Admitting: Emergency Medicine

## 2014-01-17 DIAGNOSIS — D649 Anemia, unspecified: Secondary | ICD-10-CM

## 2014-01-17 DIAGNOSIS — E119 Type 2 diabetes mellitus without complications: Secondary | ICD-10-CM | POA: Insufficient documentation

## 2014-01-17 DIAGNOSIS — Z8673 Personal history of transient ischemic attack (TIA), and cerebral infarction without residual deficits: Secondary | ICD-10-CM | POA: Insufficient documentation

## 2014-01-17 DIAGNOSIS — Z880 Allergy status to penicillin: Secondary | ICD-10-CM | POA: Insufficient documentation

## 2014-01-17 DIAGNOSIS — N189 Chronic kidney disease, unspecified: Secondary | ICD-10-CM

## 2014-01-17 DIAGNOSIS — Z79899 Other long term (current) drug therapy: Secondary | ICD-10-CM | POA: Insufficient documentation

## 2014-01-17 DIAGNOSIS — N183 Chronic kidney disease, stage 3 unspecified: Secondary | ICD-10-CM | POA: Insufficient documentation

## 2014-01-17 DIAGNOSIS — R209 Unspecified disturbances of skin sensation: Secondary | ICD-10-CM | POA: Insufficient documentation

## 2014-01-17 DIAGNOSIS — E785 Hyperlipidemia, unspecified: Secondary | ICD-10-CM | POA: Insufficient documentation

## 2014-01-17 DIAGNOSIS — I129 Hypertensive chronic kidney disease with stage 1 through stage 4 chronic kidney disease, or unspecified chronic kidney disease: Secondary | ICD-10-CM | POA: Insufficient documentation

## 2014-01-17 DIAGNOSIS — I160 Hypertensive urgency: Secondary | ICD-10-CM

## 2014-01-17 HISTORY — DX: Transient cerebral ischemic attack, unspecified: G45.9

## 2014-01-17 LAB — CBC WITH DIFFERENTIAL/PLATELET
Basophils Absolute: 0 10*3/uL (ref 0.0–0.1)
Basophils Relative: 1 % (ref 0–1)
Eosinophils Absolute: 0.1 10*3/uL (ref 0.0–0.7)
Eosinophils Relative: 3 % (ref 0–5)
HCT: 32.4 % — ABNORMAL LOW (ref 36.0–46.0)
Hemoglobin: 10.2 g/dL — ABNORMAL LOW (ref 12.0–15.0)
Lymphocytes Relative: 30 % (ref 12–46)
Lymphs Abs: 1.3 10*3/uL (ref 0.7–4.0)
MCH: 22 pg — ABNORMAL LOW (ref 26.0–34.0)
MCHC: 31.5 g/dL (ref 30.0–36.0)
MCV: 70 fL — ABNORMAL LOW (ref 78.0–100.0)
Monocytes Absolute: 0.4 10*3/uL (ref 0.1–1.0)
Monocytes Relative: 9 % (ref 3–12)
Neutro Abs: 2.5 10*3/uL (ref 1.7–7.7)
Neutrophils Relative %: 57 % (ref 43–77)
Platelets: 211 10*3/uL (ref 150–400)
RBC: 4.63 MIL/uL (ref 3.87–5.11)
RDW: 16 % — ABNORMAL HIGH (ref 11.5–15.5)
WBC: 4.3 10*3/uL (ref 4.0–10.5)

## 2014-01-17 LAB — APTT: aPTT: 27 seconds (ref 24–37)

## 2014-01-17 LAB — COMPREHENSIVE METABOLIC PANEL
ALT: 16 U/L (ref 0–35)
AST: 23 U/L (ref 0–37)
Albumin: 3.3 g/dL — ABNORMAL LOW (ref 3.5–5.2)
Alkaline Phosphatase: 90 U/L (ref 39–117)
BUN: 28 mg/dL — ABNORMAL HIGH (ref 6–23)
CO2: 26 mEq/L (ref 19–32)
Calcium: 9.2 mg/dL (ref 8.4–10.5)
Chloride: 99 mEq/L (ref 96–112)
Creatinine, Ser: 2.34 mg/dL — ABNORMAL HIGH (ref 0.50–1.10)
GFR calc Af Amer: 22 mL/min — ABNORMAL LOW (ref >90)
GFR calc non Af Amer: 19 mL/min — ABNORMAL LOW (ref >90)
Glucose, Bld: 145 mg/dL — ABNORMAL HIGH (ref 70–99)
Potassium: 3.9 mEq/L (ref 3.7–5.3)
Sodium: 140 mEq/L (ref 137–147)
Total Bilirubin: 0.3 mg/dL (ref 0.3–1.2)
Total Protein: 7.7 g/dL (ref 6.0–8.3)

## 2014-01-17 LAB — PROTIME-INR
INR: 1.08 (ref 0.00–1.49)
Prothrombin Time: 13.8 seconds (ref 11.6–15.2)

## 2014-01-17 LAB — TROPONIN I: Troponin I: <0.3 ng/mL (ref <0.30)

## 2014-01-17 MED ORDER — METOPROLOL SUCCINATE ER 25 MG PO TB24: 25.0000 mg | ORAL_TABLET | Freq: Every day | ORAL | Status: DC

## 2014-01-17 NOTE — ED Provider Notes (Signed)
9:48 AM  Assumed care from Dr. Lita Mains.  Pt is a 78 y.o. F with h/o HTN, diabetes, hyperlipidemia, chronic kidney disease who was recently admitted to the hospital for HTN encephalopathy versus TIA.  Pt has full workup in the hospital including MRI, carotid dopplers, echo.  Her blood pressure was elevated at home and she was having right hand and right foot numbness. Upon arrival to the emergency department, her symptoms have resolved and her blood pressure has improved. Suspect hypertensive encephalopathy. Workups including head CT is negative. Discussed with Dr. Leonel Ramsay with urology who agrees that this is likely hypertensive encephalopathy and does not feel the patient needs admission at this time. Patient has PCP followup with Dr. [name] on Monday, 3 days from now.  Patient and her family do not feel comfortable with plan to be discharged home. Have discussed with him at length that there is nothing further that we would offer to her in the hospital given her symptoms have resolved and her blood pressure is stable and improved. She states her blood pressure is normally elevated at night and she feels this may be do to her sleep apnea. She has had a sleep study but does not wear CPAP. Will consult her PCP for recommendations. Patient is already on aspirin 325 mg once daily.  Patient, husband and her daughter are comfortable with this plan.    -MRI: no CVA, MRA: with atherosclerotic cerebrovascular disease and high grade stenosis if posterior communicating artery  -Carotid Dopplers with 1-39% ICA stenosis  -ECHO: EF of 60% and grade 1 diastolic dysfunction     [id number] AM  Pt's other 2 daughters have now entered there were and are not comfortable with the plan to take the patient home. Have discussed with him at length that there is no further intervention that we were performing the hospital. Her blood pressure has never been greater than 180s/80s in the emergency department and she has been  here for over 6 hours and been asymptomatic. Discussed with Dr. [name] NP Cletis Athens who recommends starting the patient on metoprolol XL 25 mg. They state they do not have a recent sleep study for the patient at this time we cannot offer her CPAP. Explained this to family he was very upset. Have attempted to reassure them multiple times the patient's daughter became exceedingly angry, raising her voice. I am unable to calm the patient's daughter and at this time I do not feel that I can continue to reassure her. I still feel the patient is safe to go home and the patient is comfortable with this plan and is calm and cooperative. Have discussed strict return precautions. We'll discharge home on Toprol-XL 25 mg once daily to add to her current regimen of amlodipine, clonidine, hydralazine, losartan.  [place], DO 01/17/14 1016

## 2014-01-17 NOTE — ED Notes (Signed)
PA aware that pt wants to speak with MD regarding discharge. States that she will tell Dr. Leonides Schanz.

## 2014-01-17 NOTE — ED Notes (Signed)
Pt denies any numbness or tingling at this time

## 2014-01-17 NOTE — ED Provider Notes (Signed)
07:00AM Assumed care of the patient from Dr. Lita Mains. pateint d/c from hospital yesterday for TIA vs hypertensive encephalopathy.  Awoke this ame form sleep at 0300 with R Hand/Foot numbness and htn to 204/98.  CT pending  8:26 AM BP 151/77  Pulse 78  Temp(Src) 98.1 F (36.7 C) (Oral)  Resp 18  SpO2 95% Pt ct negative for acute abnormality. I discussed the case with Dr. Leonel Ramsay. Patient's sxs have resolved at this point and BP down.  She does not require any further work up at this time and should follow up with the patient's PCP as soon as possible. Her daughter are uncomfortable with her discharge and I have asked Dr Leonides Schanz to address the family. Patient is comfortable with plan for discharge and understands return precautions.  Margarita Mail, PA-C 01/18/14 1244

## 2014-01-17 NOTE — ED Provider Notes (Signed)
CSN: [id number]     Arrival date & time 01/17/14  0423 History   First MD Initiated Contact with Patient 01/17/14 [phone]     Chief Complaint  Patient presents with  . Hypertension  . Numbness     (Consider location/radiation/quality/duration/timing/severity/associated sxs/prior Treatment) HPI Patient recently discharged after being admitted for TIA versus hypertensive encephalopathy. She woke up around 3 AM with tingling in her right hand and right foot. Denies any numbness or weakness. Her blood pressure was 230/104 at the time. She had no slurred speech, vision changes or headache. Tingling has improved though she still has tingling in the toes of her right foot. Patient states she's been compliant with her blood pressure medications. Past Medical History  Diagnosis Date  . Diabetes mellitus without complication   . Hyperlipidemia   . Hypertension   . CKD (chronic kidney disease) stage 3, GFR 30-59 ml/min   . TIA (transient ischemic attack) 2015   Past Surgical History  Procedure Laterality Date  . Abdominal hysterectomy  1974   Family History  Problem Relation Age of Onset  . Hypertension Other   . Hyperlipidemia Other   . Stroke Other   . Diabetes type II Mother   . Hypertension Mother   . Diabetes type II Father    History  Substance Use Topics  . Smoking status: Never Smoker   . Smokeless tobacco: Not on file  . Alcohol Use: No   OB History   Grav Para Term Preterm Abortions TAB SAB Ect Mult Living                 Review of Systems  Constitutional: Negative for fever and chills.  Respiratory: Negative for cough and shortness of breath.   Cardiovascular: Negative for chest pain.  Gastrointestinal: Negative for nausea, vomiting, abdominal pain, diarrhea and constipation.  Musculoskeletal: Negative for back pain, neck pain and neck stiffness.  Skin: Negative for rash and wound.  Neurological: Negative for dizziness, syncope, weakness, light-headedness, numbness  and headaches.  All other systems reviewed and are negative.     Allergies  Penicillins  Home Medications   Prior to Admission medications   Medication Sig Start Date End Date Taking? Authorizing Provider  amLODipine (NORVASC) 10 MG tablet Take 1 tablet (10 mg total) by mouth daily. 01/16/14  Yes Donne Hazel, MD  calcitRIOL (ROCALTROL) 0.25 MCG capsule Take 0.25 mcg by mouth 2 (two) times daily.  06/26/12  Yes Historical Provider, MD  cloNIDine (CATAPRES) 0.3 MG tablet Take 1 tablet (0.3 mg total) by mouth 3 (three) times daily. 01/16/14  Yes Donne Hazel, MD  furosemide (LASIX) 40 MG tablet Take 1 tablet (40 mg total) by mouth 2 (two) times daily. 01/16/14  Yes Donne Hazel, MD  hydrALAZINE (APRESOLINE) 50 MG tablet Take 1 tablet (50 mg total) by mouth every 8 (eight) hours. 01/14/14  Yes Domenic Polite, MD  Insulin Detemir (LEVEMIR FLEXPEN) 100 UNIT/ML Pen Inject 20 Units into the skin daily at 10 pm. 01/13/14  Yes Domenic Polite, MD  losartan (COZAAR) 50 MG tablet Take 1 tablet (50 mg total) by mouth daily. 01/16/14  Yes Donne Hazel, MD  simvastatin (ZOCOR) 40 MG tablet Take 40 mg by mouth daily.  05/24/12  Yes Historical Provider, MD   BP 165/70  Pulse 84  Temp(Src) 98.1 F (36.7 C) (Oral)  Resp 26  SpO2 95% Physical Exam  Nursing note and vitals reviewed. Constitutional: She is oriented to person, place, and  time. She appears well-developed and well-nourished. No distress.  HENT:  Head: Normocephalic and atraumatic.  Mouth/Throat: Oropharynx is clear and moist. No oropharyngeal exudate.  Eyes: EOM are normal. Pupils are equal, round, and reactive to light.  Neck: Normal range of motion. Neck supple.  Cardiovascular: Normal rate and regular rhythm.  Exam reveals no gallop and no friction rub.   No murmur heard. Pulmonary/Chest: Effort normal and breath sounds normal. No respiratory distress. She has no wheezes. She has no rales. She exhibits no tenderness.  Abdominal:  Soft. Bowel sounds are normal. She exhibits no distension and no mass. There is no tenderness. There is no rebound and no guarding.  Musculoskeletal: Normal range of motion. She exhibits no edema and no tenderness.  No calf swelling or tenderness.  Neurological: She is alert and oriented to person, place, and time.  Patient is alert and oriented x3 with clear, goal oriented speech. Patient has 5/5 motor in all extremities. Sensation is intact to light touch. Continues to have tingling to the distal toes of the right foot. Bilateral finger-to-nose is normal with no signs of dysmetria.    Skin: Skin is warm and dry. No rash noted. No erythema.  Psychiatric: She has a normal mood and affect. Her behavior is normal.    ED Course  Procedures (including critical care time) Labs Review Labs Reviewed  CBC WITH DIFFERENTIAL  COMPREHENSIVE METABOLIC PANEL  TROPONIN I  PROTIME-INR  APTT    Imaging Review No results found.   EKG Interpretation None      MDM   Final diagnoses:  None    Signed out to oncoming emergency physician pending completion of her workup.    Julianne Rice, MD 01/22/14 [phone]

## 2014-01-17 NOTE — ED Notes (Signed)
Pt states that she was awakened at 0300 from sleep due to numbness and tingling in her hands and feet. Pt was just released from the hospital yesterday due to a TIA. Pt states that the symptoms she was having was the same as when she had her TIA. Upon EMS arrival, pt's manual blood pressure in the right arm was 204/98, and 230/104 in the left. Pt denies chest pain, SOB, headache and dizziness. Pt states that she has been taking her blood pressure medications as prescribed.

## 2014-01-17 NOTE — ED Notes (Signed)
MD at bedside. 

## 2014-01-17 NOTE — Discharge Instructions (Signed)

## 2014-01-20 NOTE — ED Provider Notes (Signed)
Medical screening examination/treatment/procedure(s) were conducted as a shared visit with non-physician practitioner(s) and myself.  I personally evaluated the patient during the encounter.   EKG Interpretation   Date/Time:  Friday January 17 2014 [id number] EDT Ventricular Rate:  87 PR Interval:  149 QRS Duration: 82 QT Interval:  387 QTC Calculation: 466 R Axis:   0 Text Interpretation:  Sinus rhythm Consider left ventricular hypertrophy  Borderline T abnormalities, inferior leads ED PHYSICIAN INTERPRETATION  AVAILABLE IN [facility] Confirmed by TEST, Record ([id number]) on  01/19/2014 9:12:05 AM      See my other note.  [place], DO 01/20/14 [phone]

## 2014-01-21 LAB — ALDOSTERONE + RENIN ACTIVITY W/ RATIO
ALDO / PRA Ratio: 6.3 Ratio (ref 0.9–28.9)
Aldosterone: 5 ng/dL
PRA LC/MS/MS: 0.79 ng/mL/h (ref 0.25–5.82)

## 2014-01-23 ENCOUNTER — Emergency Department (HOSPITAL_COMMUNITY)
Admission: EM | Admit: 2014-01-23 | Discharge: 2014-01-23 | Disposition: A | Discharge status: Home / Self Care | Payer: Medicare Other | Attending: Emergency Medicine | Admitting: Emergency Medicine

## 2014-01-23 ENCOUNTER — Encounter (HOSPITAL_COMMUNITY): Payer: Self-pay | Admitting: Emergency Medicine

## 2014-01-23 DIAGNOSIS — E785 Hyperlipidemia, unspecified: Secondary | ICD-10-CM | POA: Insufficient documentation

## 2014-01-23 DIAGNOSIS — N183 Chronic kidney disease, stage 3 unspecified: Secondary | ICD-10-CM | POA: Insufficient documentation

## 2014-01-23 DIAGNOSIS — I129 Hypertensive chronic kidney disease with stage 1 through stage 4 chronic kidney disease, or unspecified chronic kidney disease: Secondary | ICD-10-CM | POA: Insufficient documentation

## 2014-01-23 DIAGNOSIS — Z880 Allergy status to penicillin: Secondary | ICD-10-CM | POA: Insufficient documentation

## 2014-01-23 DIAGNOSIS — I10 Essential (primary) hypertension: Secondary | ICD-10-CM

## 2014-01-23 DIAGNOSIS — Z7982 Long term (current) use of aspirin: Secondary | ICD-10-CM | POA: Insufficient documentation

## 2014-01-23 DIAGNOSIS — Z794 Long term (current) use of insulin: Secondary | ICD-10-CM | POA: Insufficient documentation

## 2014-01-23 DIAGNOSIS — E119 Type 2 diabetes mellitus without complications: Secondary | ICD-10-CM | POA: Insufficient documentation

## 2014-01-23 DIAGNOSIS — Z79899 Other long term (current) drug therapy: Secondary | ICD-10-CM | POA: Insufficient documentation

## 2014-01-23 DIAGNOSIS — Z8673 Personal history of transient ischemic attack (TIA), and cerebral infarction without residual deficits: Secondary | ICD-10-CM | POA: Insufficient documentation

## 2014-01-23 LAB — I-STAT CHEM 8, ED
BUN: 35 mg/dL — ABNORMAL HIGH (ref 6–23)
Calcium, Ion: 1.18 mmol/L (ref 1.13–1.30)
Chloride: 101 mEq/L (ref 96–112)
Creatinine, Ser: 2.1 mg/dL — ABNORMAL HIGH (ref 0.50–1.10)
Glucose, Bld: 118 mg/dL — ABNORMAL HIGH (ref 70–99)
HCT: 37 % (ref 36.0–46.0)
Hemoglobin: 12.6 g/dL (ref 12.0–15.0)
Potassium: 4.1 mEq/L (ref 3.7–5.3)
Sodium: 140 mEq/L (ref 137–147)
TCO2: 29 mmol/L (ref 0–100)

## 2014-01-23 MED ORDER — CLONIDINE HCL 0.1 MG PO TABS
0.1000 mg | ORAL_TABLET | Freq: Once | ORAL | Status: AC
  Administered 2014-01-23: 0.1 mg via ORAL
  Filled 2014-01-23: qty 1

## 2014-01-23 MED ORDER — CLONIDINE HCL 0.2 MG PO TABS
0.3000 mg | ORAL_TABLET | Freq: Once | ORAL | Status: AC
  Administered 2014-01-23: 0.3 mg via ORAL
  Filled 2014-01-23 (×2): qty 1

## 2014-01-23 NOTE — Discharge Instructions (Signed)
PLEASE TAKE CLONIDINE 0.1 MG THREE TIMES A DAY FOR BETTER CONTROL OF YOUR BLOOD PRESSURE.

## 2014-01-23 NOTE — ED Provider Notes (Signed)
CSN: [id number]     Arrival date & time 01/23/14  1503 History   First MD Initiated Contact with Patient 01/23/14 1505     Chief Complaint  Patient presents with  . Hypertension     (Consider location/radiation/quality/duration/timing/severity/associated sxs/prior Treatment) HPI  Patient presents to the ER with complaints of hypertension. She was seen and admitted to the ED recently for hypertensive Urgency and her medications were adjusted. She takes Toprol- XL, Hydralazine, Furosamide, and Norvasc.She also has Clonidine which the hospital recommended she take 0.1 mg Clonidine three times a day. After being discharged from the hospital she was doing well until she saw her PCP who recommended that she take the Clonidine only if her BP systolic is great than [id number]. The daughter reports that since that change her blood pressure has been very difficult to manage and running high. They did give her 0.1mg  Clonidine at home, it did not help, therefore she was brought to the ED. The patient has no complaints, s/sx. Reports she feels well, currently waiting to get her CPAP machine.  Past Medical History  Diagnosis Date  . Diabetes mellitus without complication   . Hyperlipidemia   . Hypertension   . CKD (chronic kidney disease) stage 3, GFR 30-59 ml/min   . TIA (transient ischemic attack) 2015   Past Surgical History  Procedure Laterality Date  . Abdominal hysterectomy  1974   Family History  Problem Relation Age of Onset  . Hypertension Other   . Hyperlipidemia Other   . Stroke Other   . Diabetes type II Mother   . Hypertension Mother   . Diabetes type II Father    History  Substance Use Topics  . Smoking status: Never Smoker   . Smokeless tobacco: Not on file  . Alcohol Use: No   OB History   Grav Para Term Preterm Abortions TAB SAB Ect Mult Living                 Review of Systems   Review of Systems  Constitutional: Negative for fever and chills.  Respiratory: Negative for  cough and shortness of breath.  Cardiovascular: Negative for chest pain.  Gastrointestinal: Negative for nausea, vomiting, abdominal pain, diarrhea and constipation.  Musculoskeletal: Negative for back pain, neck pain and neck stiffness.  Skin: Negative for rash and wound.  Neurological: Negative for dizziness, syncope, weakness, light-headedness, numbness and headaches.  All other systems reviewed and are negative.   Allergies  Penicillins  Home Medications   Prior to Admission medications   Medication Sig Start Date End Date Taking? Authorizing Provider  amLODipine (NORVASC) 10 MG tablet Take 1 tablet (10 mg total) by mouth daily. 01/16/14  Yes Donne Hazel, MD  aspirin 325 MG tablet Take 325 mg by mouth daily.   Yes Historical Provider, MD  calcitRIOL (ROCALTROL) 0.25 MCG capsule Take 0.25 mcg by mouth 2 (two) times daily.  06/26/12  Yes Historical Provider, MD  cloNIDine (CATAPRES) 0.3 MG tablet Take 0.3 mg by mouth daily as needed (for blood pressure).   Yes Historical Provider, MD  furosemide (LASIX) 40 MG tablet Take 1 tablet (40 mg total) by mouth 2 (two) times daily. 01/16/14  Yes Donne Hazel, MD  hydrALAZINE (APRESOLINE) 50 MG tablet Take 1 tablet (50 mg total) by mouth every 8 (eight) hours. 01/14/14  Yes Domenic Polite, MD  insulin detemir (LEVEMIR) 100 UNIT/ML injection Inject 32 Units into the skin 2 (two) times daily.   Yes Historical Provider,  MD  metoprolol succinate (TOPROL-XL) 25 MG 24 hr tablet Take 1 tablet (25 mg total) by mouth daily. 01/17/14  Yes Kristen N Ward, DO  olmesartan (BENICAR) 40 MG tablet Take 40 mg by mouth daily.   Yes Historical Provider, MD  simvastatin (ZOCOR) 40 MG tablet Take 40 mg by mouth daily.  05/24/12  Yes Historical Provider, MD   BP 149/66  Pulse 66  Temp(Src) 97.3 F (36.3 C) (Oral)  Resp 22  SpO2 98% Physical Exam  Nursing note and vitals reviewed. Constitutional: She is oriented to person, place, and time. She appears  well-developed and well-nourished. No distress.  HENT:  Head: Normocephalic and atraumatic.  Eyes: Pupils are equal, round, and reactive to light.  Neck: Normal range of motion. Neck supple.  Cardiovascular: Normal rate and regular rhythm.   Pulmonary/Chest: Effort normal.  No orthopnea  Abdominal: Soft.  Musculoskeletal:  No calf swelling or tenderness.    Neurological: She is alert and oriented to person, place, and time. She has normal strength. No cranial nerve deficit or sensory deficit.  Skin: Skin is warm and dry.    ED Course  Procedures (including critical care time) Labs Review Labs Reviewed  I-STAT CHEM 8, ED - Abnormal; Notable for the following:    BUN 35 (*)    Creatinine, Ser 2.10 (*)    Glucose, Bld 118 (*)    All other components within normal limits    Imaging Review No results found.   EKG Interpretation None      MDM   Final diagnoses:  Essential hypertension    Discussed case with Dr. Audie Pinto, BP checks q 30 minutes. 0.3 mg Clonidine given PO, after recheck BP has improved to 183/88, he recommends giving 0.1 mg Clonidine PO.  At discharge we will recommend patient continues to take    6:02 PM Map score is now 94. Will have patient take Clonidine 0.2 mg TID from now on.  Family feels comfortable with letting pt go home.  78 y.o.[name] [name]'s evaluation in the Emergency Department is complete. It has been determined that no acute conditions requiring further emergency intervention are present at this time. The patient/guardian have been advised of the diagnosis and plan. We have discussed signs and symptoms that warrant return to the ED, such as changes or worsening in symptoms.  Vital signs are stable at discharge. Filed Vitals:   01/23/14 1800  BP: 149/66  Pulse: 66  Temp:   Resp:     Patient/guardian has voiced understanding and agreed to follow-up with the PCP or specialist.    Linus Mako, PA-C 01/23/14 1803

## 2014-01-23 NOTE — ED Provider Notes (Signed)
Medical screening examination/treatment/procedure(s) were performed by non-physician practitioner and as supervising physician I was immediately available for consultation/collaboration.    Dot Lanes, MD 01/23/14 [phone]

## 2014-01-23 NOTE — ED Notes (Signed)
Patient out of hall way and into room 44, connected to the monitor.

## 2014-01-23 NOTE — ED Notes (Signed)
Patient reports feeling fatigue and realized her blood pressure was high, EMS had 240/120 initially, came down to 170/110 during transport.  Recently discharged for this same issue.

## 2014-01-25 ENCOUNTER — Emergency Department (HOSPITAL_COMMUNITY)
Admission: EM | Admit: 2014-01-25 | Discharge: 2014-01-25 | Disposition: A | Discharge status: Home / Self Care | Payer: Medicare Other | Attending: Emergency Medicine | Admitting: Emergency Medicine

## 2014-01-25 ENCOUNTER — Encounter (HOSPITAL_COMMUNITY): Payer: Self-pay | Admitting: Emergency Medicine

## 2014-01-25 DIAGNOSIS — Z7982 Long term (current) use of aspirin: Secondary | ICD-10-CM | POA: Insufficient documentation

## 2014-01-25 DIAGNOSIS — N183 Chronic kidney disease, stage 3 unspecified: Secondary | ICD-10-CM | POA: Insufficient documentation

## 2014-01-25 DIAGNOSIS — Z8673 Personal history of transient ischemic attack (TIA), and cerebral infarction without residual deficits: Secondary | ICD-10-CM | POA: Insufficient documentation

## 2014-01-25 DIAGNOSIS — E785 Hyperlipidemia, unspecified: Secondary | ICD-10-CM | POA: Insufficient documentation

## 2014-01-25 DIAGNOSIS — Z794 Long term (current) use of insulin: Secondary | ICD-10-CM | POA: Insufficient documentation

## 2014-01-25 DIAGNOSIS — R197 Diarrhea, unspecified: Secondary | ICD-10-CM | POA: Insufficient documentation

## 2014-01-25 DIAGNOSIS — E162 Hypoglycemia, unspecified: Secondary | ICD-10-CM

## 2014-01-25 DIAGNOSIS — R109 Unspecified abdominal pain: Secondary | ICD-10-CM | POA: Insufficient documentation

## 2014-01-25 DIAGNOSIS — I129 Hypertensive chronic kidney disease with stage 1 through stage 4 chronic kidney disease, or unspecified chronic kidney disease: Secondary | ICD-10-CM | POA: Insufficient documentation

## 2014-01-25 DIAGNOSIS — E1169 Type 2 diabetes mellitus with other specified complication: Secondary | ICD-10-CM | POA: Insufficient documentation

## 2014-01-25 DIAGNOSIS — Z79899 Other long term (current) drug therapy: Secondary | ICD-10-CM | POA: Insufficient documentation

## 2014-01-25 DIAGNOSIS — Z880 Allergy status to penicillin: Secondary | ICD-10-CM | POA: Insufficient documentation

## 2014-01-25 LAB — CBC WITH DIFFERENTIAL/PLATELET
Basophils Absolute: 0 10*3/uL (ref 0.0–0.1)
Basophils Relative: 0 % (ref 0–1)
Eosinophils Absolute: 0 10*3/uL (ref 0.0–0.7)
Eosinophils Relative: 0 % (ref 0–5)
HCT: 34.5 % — ABNORMAL LOW (ref 36.0–46.0)
Hemoglobin: 11 g/dL — ABNORMAL LOW (ref 12.0–15.0)
Lymphocytes Relative: 15 % (ref 12–46)
Lymphs Abs: 1.2 10*3/uL (ref 0.7–4.0)
MCH: 22.6 pg — ABNORMAL LOW (ref 26.0–34.0)
MCHC: 31.9 g/dL (ref 30.0–36.0)
MCV: 70.8 fL — ABNORMAL LOW (ref 78.0–100.0)
Monocytes Absolute: 0.1 10*3/uL (ref 0.1–1.0)
Monocytes Relative: 1 % — ABNORMAL LOW (ref 3–12)
Neutro Abs: 6.7 10*3/uL (ref 1.7–7.7)
Neutrophils Relative %: 84 % — ABNORMAL HIGH (ref 43–77)
Platelets: 335 10*3/uL (ref 150–400)
RBC: 4.87 MIL/uL (ref 3.87–5.11)
RDW: 15.7 % — ABNORMAL HIGH (ref 11.5–15.5)
WBC: 8 10*3/uL (ref 4.0–10.5)

## 2014-01-25 LAB — URINE MICROSCOPIC-ADD ON

## 2014-01-25 LAB — BASIC METABOLIC PANEL
BUN: 32 mg/dL — ABNORMAL HIGH (ref 6–23)
CO2: 21 mEq/L (ref 19–32)
Calcium: 9.4 mg/dL (ref 8.4–10.5)
Chloride: 98 mEq/L (ref 96–112)
Creatinine, Ser: 2.04 mg/dL — ABNORMAL HIGH (ref 0.50–1.10)
GFR calc Af Amer: 26 mL/min — ABNORMAL LOW (ref >90)
GFR calc non Af Amer: 23 mL/min — ABNORMAL LOW (ref >90)
Glucose, Bld: 132 mg/dL — ABNORMAL HIGH (ref 70–99)
Potassium: 4.4 mEq/L (ref 3.7–5.3)
Sodium: 136 mEq/L — ABNORMAL LOW (ref 137–147)

## 2014-01-25 LAB — URINALYSIS, ROUTINE W REFLEX MICROSCOPIC
Bilirubin Urine: NEGATIVE
Glucose, UA: NEGATIVE mg/dL
Hgb urine dipstick: NEGATIVE
Ketones, ur: NEGATIVE mg/dL
Leukocytes, UA: NEGATIVE
Nitrite: NEGATIVE
Protein, ur: 100 mg/dL — AB
Specific Gravity, Urine: 1.011 (ref 1.005–1.030)
Urobilinogen, UA: 1 mg/dL (ref 0.0–1.0)
pH: 5 (ref 5.0–8.0)

## 2014-01-25 LAB — CBG MONITORING, ED: Glucose-Capillary: 105 mg/dL — ABNORMAL HIGH (ref 70–99)

## 2014-01-25 MED ORDER — DICYCLOMINE HCL 10 MG PO CAPS
10.0000 mg | ORAL_CAPSULE | Freq: Once | ORAL | Status: AC
  Administered 2014-01-25: 10 mg via ORAL
  Filled 2014-01-25: qty 1

## 2014-01-25 NOTE — ED Provider Notes (Signed)
CSN: [id number]     Arrival date & time 01/25/14  1023 History   First MD Initiated Contact with Patient 01/25/14 1025     Chief Complaint  Patient presents with  . Hypoglycemia     (Consider location/radiation/quality/duration/timing/severity/associated sxs/prior Treatment) HPI  Patient presents to the emergency department for evaluation of hypoglycemia and diarrhea. She was seen before on 01/23/2014 by myself for hypertension. At this time she had her medication adjusted and was discharged home. On arrival her blood pressure is 180/92. The patient is a poor historian and reports that she told her family members that she really needed to use the restroom by her husband wasn't listening to her. She reported having cramping in her abdomen. According to the patient she called EMS because she started not to feel well patient that she's having hot flashes. When EMS came out there her CBG was 50. The patient is a diabetic and is on insulin. She denies that she took anymore and he last medication than normal. She said she didn't eat anymore less than normal. Upon arrival to the ER the patient had multiple episodes of explosive diarrhea and gas. She denies having any belly pain and reports that she feels a lot better. Patient states "I told my family need to go the bathroom". Per EMS her CBG has improved to 10 with juice and peanut butter sandwich. The patient denies headache, change in vision, weakness generalized or focal, chest pain, abdominal pain, vomiting, shortness of breath or dysuria. Patient is awake and alert, oriented to place, person and time.  Past Medical History  Diagnosis Date  . Diabetes mellitus without complication   . Hyperlipidemia   . Hypertension   . CKD (chronic kidney disease) stage 3, GFR 30-59 ml/min   . TIA (transient ischemic attack) 2015   Past Surgical History  Procedure Laterality Date  . Abdominal hysterectomy  1974   Family History  Problem Relation Age of Onset   . Hypertension Other   . Hyperlipidemia Other   . Stroke Other   . Diabetes type II Mother   . Hypertension Mother   . Diabetes type II Father    History  Substance Use Topics  . Smoking status: Never Smoker   . Smokeless tobacco: Not on file  . Alcohol Use: No   OB History   Grav Para Term Preterm Abortions TAB SAB Ect Mult Living                 Review of Systems   Review of Systems  Gen: no weight loss, fevers, chills, night sweats  Eyes: no discharge or drainage, no occular pain or visual changes  Nose: no epistaxis or rhinorrhea  Mouth: no dental pain, no sore throat  Neck: no neck pain  Lungs:No wheezing, coughing or hemoptysis CV: no chest pain, palpitations, dependent edema or orthopnea  Abd: no abdominal pain, nausea, vomiting, + diarrhea and abdominal cramping GU: no dysuria or gross hematuria  MSK:  No muscle weakness or pain Neuro: no headache, no focal neurologic deficits  Skin: no rash or wounds Psyche: no complaints    Allergies  Penicillins  Home Medications   Prior to Admission medications   Medication Sig Start Date End Date Taking? Authorizing Provider  amLODipine (NORVASC) 10 MG tablet Take 1 tablet (10 mg total) by mouth daily. 01/16/14  Yes Donne Hazel, MD  aspirin 325 MG tablet Take 325 mg by mouth daily.   Yes Historical Provider, MD  calcitRIOL (  ROCALTROL) 0.25 MCG capsule Take 0.25 mcg by mouth 2 (two) times daily.  06/26/12  Yes Historical Provider, MD  cloNIDine (CATAPRES) 0.3 MG tablet Take 0.3 mg by mouth daily as needed (for blood pressure).   Yes Historical Provider, MD  furosemide (LASIX) 40 MG tablet Take 1 tablet (40 mg total) by mouth 2 (two) times daily. 01/16/14  Yes Donne Hazel, MD  hydrALAZINE (APRESOLINE) 50 MG tablet Take 1 tablet (50 mg total) by mouth every 8 (eight) hours. 01/14/14  Yes Domenic Polite, MD  insulin detemir (LEVEMIR) 100 UNIT/ML injection Inject 32 Units into the skin 2 (two) times daily.   Yes  Historical Provider, MD  metoprolol succinate (TOPROL-XL) 25 MG 24 hr tablet Take 1 tablet (25 mg total) by mouth daily. 01/17/14  Yes Kristen N Ward, DO  olmesartan (BENICAR) 40 MG tablet Take 40 mg by mouth daily.   Yes Historical Provider, MD  simvastatin (ZOCOR) 40 MG tablet Take 40 mg by mouth daily.  05/24/12  Yes Historical Provider, MD   BP 180/92  Pulse 59  Temp(Src) 98.6 F (37 C) (Oral)  Resp 16  SpO2 98% Physical Exam  Nursing note and vitals reviewed. Constitutional: She appears well-developed and well-nourished. No distress.  HENT:  Head: Normocephalic and atraumatic.  Eyes: Pupils are equal, round, and reactive to light.  Neck: Normal range of motion. Neck supple.  Cardiovascular: Normal rate and regular rhythm.   Pulmonary/Chest: Effort normal.  Abdominal: Soft. She exhibits no distension and no ascites. Bowel sounds are increased. There is tenderness (very mild and diffuse tenderness). There is no rigidity, no rebound, no guarding and no CVA tenderness.  Pt unable to control flatulence during exam  Neurological: She is alert.  Skin: Skin is warm and dry.    ED Course  Procedures (including critical care time) Labs Review Labs Reviewed  BASIC METABOLIC PANEL - Abnormal; Notable for the following:    Sodium 136 (*)    Glucose, Bld 132 (*)    BUN 32 (*)    Creatinine, Ser 2.04 (*)    GFR calc non Af Amer 23 (*)    GFR calc Af Amer 26 (*)    All other components within normal limits  CBC WITH DIFFERENTIAL - Abnormal; Notable for the following:    Hemoglobin 11.0 (*)    HCT 34.5 (*)    MCV 70.8 (*)    MCH 22.6 (*)    RDW 15.7 (*)    Neutrophils Relative % 84 (*)    Monocytes Relative 1 (*)    All other components within normal limits  URINALYSIS, ROUTINE W REFLEX MICROSCOPIC - Abnormal; Notable for the following:    Protein, ur 100 (*)    All other components within normal limits  CBG MONITORING, ED - Abnormal; Notable for the following:     Glucose-Capillary 105 (*)    All other components within normal limits  STOOL CULTURE  URINE MICROSCOPIC-ADD ON  CBG MONITORING, ED    Imaging Review No results found.   EKG Interpretation   Date/Time:  Saturday January 25 2014 10:31:55 EDT Ventricular Rate:  60 PR Interval:  160 QRS Duration: 98 QT Interval:  496 QTC Calculation: 496 R Axis:   45 Text Interpretation:  Sinus rhythm Nonspecific T abnormalities, lateral  leads Borderline prolonged QT interval No significant change since 01/17/14  Confirmed by Beau Fanny  MD, DOUGLAS ([id number]) on 01/25/2014 10:40:52 AM      MDM   Final diagnoses:  Hypoglycemia  Diarrhea   11:05 am Patient here with hypoglycemia and explosive diarrhea without abdominal pain. Her symptoms do not appear to be causing her very much distress. She tells her she told her family she needed to go to the restroom  but they would listen. Her vital signs are reassuring. Will order stool culture, BMP, CBC, urinalysis and monitor her glucose wall she's here.  1:37 pm Labs are reassuring, patient has been monitored in the ED for 2 hours and has not had anymore diarrhea and her glucose has stayed stable. The daughter says her mom has been eating all spinach and salads for the past week which is a significant dietary change which is why she feels she has been having diarrhea. I have advised to dietary recommendations.  I discussed the patient with Dr. Stark Jock who agrees with my work-up, evaluation and plan. Family members are present and are pleased and happy to take her home. Patient also happy to go home.  78 y.o.Almira Coaster Lymon's evaluation in the Emergency Department is complete. It has been determined that no acute conditions requiring further emergency intervention are present at this time. The patient/guardian have been advised of the diagnosis and plan. We have discussed signs and symptoms that warrant return to the ED, such as changes or worsening in  symptoms.  Vital signs are stable at discharge. Filed Vitals:   01/25/14 1050  BP: 180/92  Pulse: 59  Temp: 98.6 F (37 C)  Resp: 16    Patient/guardian has voiced understanding and agreed to follow-up with the PCP or specialist.   Linus Mako, PA-C 01/25/14 1338

## 2014-01-25 NOTE — ED Notes (Signed)
Per EMS- pt called out for not feeling well. Pt was noted by EMS to be cool and diaphoretic with a cbg of 50. Pt was given juice and peanut buttersandwich. Pt CBG increased to 110. Pt noted to only be oriented to self with EMS. Upon arrival pt was a&o x4

## 2014-01-25 NOTE — Discharge Instructions (Signed)
Diarrhea Diarrhea is frequent loose and watery bowel movements. It can cause you to feel weak and dehydrated. Dehydration can cause you to become tired and thirsty, have a dry mouth, and have decreased urination that often is dark yellow. Diarrhea is a sign of another problem, most often an infection that will not last [place]. In most cases, diarrhea typically lasts 2-3 days. However, it can last longer if it is a sign of something more serious. It is important to treat your diarrhea as directed by your caregive to lessen or prevent future episodes of diarrhea. CAUSES  Some common causes include:  Gastrointestinal infections caused by viruses, bacteria, or parasites.  Food poisoning or food allergies.  Certain medicines, such as antibiotics, chemotherapy, and laxatives.  Artificial sweeteners and fructose.  Digestive disorders. HOME CARE INSTRUCTIONS  Ensure adequate fluid intake (hydration): have 1 cup (8 oz) of fluid for each diarrhea episode. Avoid fluids that contain simple sugars or sports drinks, fruit juices, whole milk products, and sodas. Your urine should be clear or pale yellow if you are drinking enough fluids. Hydrate with an oral rehydration solution that you can purchase at pharmacies, retail stores, and online. You can prepare an oral rehydration solution at home by mixing the following ingredients together:   - tsp table salt.   tsp baking soda.   tsp salt substitute containing potassium chloride.  1  tablespoons sugar.  1 L (34 oz) of water.  Certain foods and beverages may increase the speed at which food moves through the gastrointestinal (GI) tract. These foods and beverages should be avoided and include:  Caffeinated and alcoholic beverages.  High-fiber foods, such as raw fruits and vegetables, nuts, seeds, and whole grain breads and cereals.  Foods and beverages sweetened with sugar alcohols, such as xylitol, sorbitol, and mannitol.  Some foods may be well  tolerated and may help thicken stool including:  Starchy foods, such as rice, toast, pasta, low-sugar cereal, oatmeal, grits, baked potatoes, crackers, and bagels.  Bananas.  Applesauce.  Add probiotic-rich foods to help increase healthy bacteria in the GI tract, such as yogurt and fermented milk products.  Wash your hands well after each diarrhea episode.  Only take over-the-counter or prescription medicines as directed by your caregiver.  Take a warm bath to relieve any burning or pain from frequent diarrhea episodes. SEEK IMMEDIATE MEDICAL CARE IF:   You are unable to keep fluids down.  You have persistent vomiting.  You have blood in your stool, or your stools are black and tarry.  You do not urinate in 6-8 hours, or there is only a small amount of very dark urine.  You have abdominal pain that increases or localizes.  You have weakness, dizziness, confusion, or lightheadedness.  You have a severe headache.  Your diarrhea gets worse or does not get better.  You have a fever or persistent symptoms for more than 2-3 days.  You have a fever and your symptoms suddenly get worse. MAKE SURE YOU:   Understand these instructions.  Will watch your condition.  Will get help right away if you are not doing well or get worse. Document Released: 07/15/2002 Document Revised: 07/11/2012 Document Reviewed: 04/01/2012 [facility] Patient Information 2015 [place], [place]. This information is not intended to replace advice given to you by your health care provider. Make sure you discuss any questions you have with your health care provider.  Diabetes Mellitus and Food It is important for you to manage your blood sugar (glucose) level.  Your blood glucose level can be greatly affected by what you eat. Eating healthier foods in the appropriate amounts throughout the day at about the same time each day will help you control your blood glucose level. It can also help slow or prevent worsening  of your diabetes mellitus. Healthy eating may even help you improve the level of your blood pressure and reach or maintain a healthy weight.  HOW CAN FOOD AFFECT ME? Carbohydrates Carbohydrates affect your blood glucose level more than any other type of food. Your dietitian will help you determine how many carbohydrates to eat at each meal and teach you how to count carbohydrates. Counting carbohydrates is important to keep your blood glucose at a healthy level, especially if you are using insulin or taking certain medicines for diabetes mellitus. Alcohol Alcohol can cause sudden decreases in blood glucose (hypoglycemia), especially if you use insulin or take certain medicines for diabetes mellitus. Hypoglycemia can be a life-threatening condition. Symptoms of hypoglycemia (sleepiness, dizziness, and disorientation) are similar to symptoms of having too much alcohol.  If your health care provider has given you approval to drink alcohol, do so in moderation and use the following guidelines:  Women should not have more than one drink per day, and men should not have more than two drinks per day. One drink is equal to:  12 oz of beer.  5 oz of wine.  1 oz of hard liquor.  Do not drink on an empty stomach.  Keep yourself hydrated. Have water, diet soda, or unsweetened iced tea.  Regular soda, juice, and other mixers might contain a lot of carbohydrates and should be counted. WHAT FOODS ARE NOT RECOMMENDED? As you make food choices, it is important to remember that all foods are not the same. Some foods have fewer nutrients per serving than other foods, even though they might have the same number of calories or carbohydrates. It is difficult to get your body what it needs when you eat foods with fewer nutrients. Examples of foods that you should avoid that are high in calories and carbohydrates but low in nutrients include:  Trans fats (most processed foods list trans fats on the Nutrition Facts  label).  Regular soda.  Juice.  Candy.  Sweets, such as cake, pie, doughnuts, and cookies.  Fried foods. WHAT FOODS CAN I EAT? Have nutrient-rich foods, which will nourish your body and keep you healthy. The food you should eat also will depend on several factors, including:  The calories you need.  The medicines you take.  Your weight.  Your blood glucose level.  Your blood pressure level.  Your cholesterol level. You also should eat a variety of foods, including:  Protein, such as meat, poultry, fish, tofu, nuts, and seeds (lean animal proteins are best).  Fruits.  Vegetables.  Dairy products, such as milk, cheese, and yogurt (low fat is best).  Breads, grains, pasta, cereal, rice, and beans.  Fats such as olive oil, trans fat-free margarine, canola oil, avocado, and olives. DOES EVERYONE WITH DIABETES MELLITUS HAVE THE SAME MEAL PLAN? Because every person with diabetes mellitus is different, there is not one meal plan that works for everyone. It is very important that you meet with a dietitian who will help you create a meal plan that is just right for you. Document Released: 04/21/2005 Document Revised: 07/30/2013 Document Reviewed: 06/21/2013 [facility] Patient Information 2015 [place], [place]. This information is not intended to replace advice given to you by your health care provider.  Make sure you discuss any questions you have with your health care provider. ° °

## 2014-01-26 NOTE — ED Provider Notes (Signed)
Medical screening examination/treatment/procedure(s) were performed by non-physician practitioner and as supervising physician I was immediately available for consultation/collaboration.   EKG Interpretation   Date/Time:  Saturday January 25 2014 10:31:55 EDT Ventricular Rate:  60 PR Interval:  160 QRS Duration: 98 QT Interval:  496 QTC Calculation: 496 R Axis:   45 Text Interpretation:  Sinus rhythm Nonspecific T abnormalities, lateral  leads Borderline prolonged QT interval No significant change since 01/17/14  Confirmed by Beau Fanny  MD, DOUGLAS ([id number]) on 01/25/2014 10:40:52 AM       Veryl Speak, MD 01/26/14 [phone]

## 2014-01-27 LAB — CBG MONITORING, ED: Glucose-Capillary: 123 mg/dL — ABNORMAL HIGH (ref 70–99)

## 2014-01-29 LAB — STOOL CULTURE

## 2014-02-04 ENCOUNTER — Encounter: Payer: Self-pay | Admitting: Neurology

## 2014-03-06 ENCOUNTER — Encounter: Payer: Self-pay | Admitting: Neurology

## 2014-03-06 ENCOUNTER — Ambulatory Visit (INDEPENDENT_AMBULATORY_CARE_PROVIDER_SITE_OTHER): Payer: Medicare Other | Admitting: Neurology

## 2014-03-06 VITALS — BP 147/66 | HR 70 | Ht 60.5 in | Wt 188.5 lb

## 2014-03-06 DIAGNOSIS — I674 Hypertensive encephalopathy: Secondary | ICD-10-CM

## 2014-03-06 DIAGNOSIS — G4733 Obstructive sleep apnea (adult) (pediatric): Secondary | ICD-10-CM

## 2014-03-06 DIAGNOSIS — I10 Essential (primary) hypertension: Secondary | ICD-10-CM

## 2014-03-06 DIAGNOSIS — E785 Hyperlipidemia, unspecified: Secondary | ICD-10-CM

## 2014-03-06 DIAGNOSIS — E119 Type 2 diabetes mellitus without complications: Secondary | ICD-10-CM

## 2014-03-06 NOTE — Patient Instructions (Signed)
1. Continue ASA and zocor for stroke prevention 2. continue to monitor BP and sugar level at home and record and bring over to Dr. Baird Cancer 3. Aggressively treat HTN, DM and HLD 4. Follow up with Dr. Baird Cancer closely for stroke risk factor modification 5. Follow up in clinic in 3 months.

## 2014-03-06 NOTE — Progress Notes (Signed)
NEUROLOGY CLINIC NEW PATIENT NOTE  NAME: [name] [name] DOB: 1935/09/30  I saw [name] [name] as a new patient in the neurovascular clinic today regarding  Chief Complaint  Patient presents with  . Follow-up    CVA #1  .  HPI: [name] [name] is a 78 y.o. female with a PMH of HTN, DM, HLD, OSA on CPAP presented as new pt for hospital discharge in 01/2014. She initially was seen in ER for hypertensive urgency and was discharged to home. Later on in the evening she developed slurred speech and family brought her back in. Her blood pressure initially was up to 243/112. She has hx of CKD and have been getting iron infusions 2 days prior to admission and she could not complete it due to severe HTN and was sent to ER at that time but not admitted. After admission, her BP was gradually normalized with multiple BP medications. Her slurry speech was resolved after BP in control. Stroke work up included MRI no acute stroke and MRA with atherosclerotic cerebrovascular disease and high grade stenosis in posterior communicating artery. 2D echo and CUS unremarkable and she was discharged with ASA and zocor for stroke prevention. Today she was here for hospital discharge follow up.  During the interval time, her BP was under control, around 140s/70s, today in clinic 147/66. His glucose monitor at home still showed up and down and her A1C was 8.5 during admission. She has hx of CKD not on dialysis. She has OSA and on CPAP for a month now. She denies smoking, alcohol and illicit drugs.  Outside reports reviewed: chart during admission in 01/2014.  Past Medical History  Diagnosis Date  . Diabetes mellitus without complication   . Hyperlipidemia   . Hypertension   . CKD (chronic kidney disease) stage 3, GFR 30-59 ml/min   . TIA (transient ischemic attack) 2015   Past Surgical History  Procedure Laterality Date  . Abdominal hysterectomy  1974   Family History  Problem Relation Age  of Onset  . Hypertension Other   . Hyperlipidemia Other   . Stroke Other   . Diabetes type II Mother   . Hypertension Mother   . Diabetes type II Father    Current Outpatient Prescriptions  Medication Sig Dispense Refill  . amLODipine (NORVASC) 10 MG tablet Take 1 tablet (10 mg total) by mouth daily.  30 tablet  0  . aspirin 325 MG tablet Take 325 mg by mouth daily.      . calcitRIOL (ROCALTROL) 0.25 MCG capsule Take 0.25 mcg by mouth 2 (two) times daily.       . cloNIDine (CATAPRES) 0.3 MG tablet Take 0.3 mg by mouth daily as needed (for blood pressure).      . furosemide (LASIX) 40 MG tablet Take 1 tablet (40 mg total) by mouth 2 (two) times daily.  30 tablet  0  . hydrALAZINE (APRESOLINE) 50 MG tablet Take 1 tablet (50 mg total) by mouth every 8 (eight) hours.  90 tablet  0  . insulin detemir (LEVEMIR) 100 UNIT/ML injection Inject 32 Units into the skin 2 (two) times daily.      . metoprolol succinate (TOPROL-XL) 25 MG 24 hr tablet Take 1 tablet (25 mg total) by mouth daily.  30 tablet  0  . olmesartan (BENICAR) 40 MG tablet Take 40 mg by mouth daily.      . simvastatin (ZOCOR) 40 MG tablet Take 40 mg by mouth daily.  No current facility-administered medications for this visit.   Allergies  Allergen Reactions  . Penicillins Swelling and Rash   History   Social History  . Marital Status: Married    Spouse Name: N/A    Number of Children: 6  . Years of Education: 12th   Occupational History  . retired    Social History Main Topics  . Smoking status: Never Smoker   . Smokeless tobacco: Not on file  . Alcohol Use: No  . Drug Use: No  . Sexual Activity: Not Currently   Other Topics Concern  . Not on file   Social History Narrative   Patient lives with her husband    Patient is right handed   Patient drinks tea    Review of Systems Full 14 system review of systems performed and notable only for those listed, all others are neg:  Constitutional: N/A    Cardiovascular: swelling in legs  Ear/Nose/Throat: N/A  Skin: N/A  Eyes: N/A  Respiratory: N/A  Gastroitestinal: constipation  Hematology/Lymphatic: N/A  Endocrine: N/A  Musculoskeletal: N/A  Allergy/Immunology: N/A  Neurological: N/A  Psychiatric: N/A  Physical Exam  Filed Vitals:   03/06/14 1310  BP: 147/66  Pulse: 70    General - Well nourished, well developed, in no apparent distress.  Ophthalmologic - Sharp disc margins OU.  Cardiovascular - Regular rate and rhythm with no murmur. Carotid pulses were 2+ without bruits .   Neck - supple, no nuchal rigidity .  Mental Status -  Level of arousal and orientation to time, place, and person were intact. Language including expression, naming, repetition, comprehension, reading, and writing was assessed and found intact. Attention span and concentration were normal. Recent and remote memory were intact. Fund of Knowledge was assessed and was intact.  Cranial Nerves II - XII - II - Vision intact OU. III, IV, VI - Extraocular movements intact. V - Facial sensation intact bilaterally. VII - Facial movement intact bilaterally. VIII - Hearing & vestibular intact bilaterally. X - Palate elevates symmetrically. XI - Chin turning & shoulder shrug intact bilaterally. XII - Tongue protrusion intact.  Motor Strength - The patient's strength was normal in all extremities and pronator drift was absent.  Bulk was normal and fasciculations were absent.   Motor Tone - Muscle tone was assessed at the neck and appendages and was normal.  Reflexes - The patient's reflexes were normal in all extremities and she had no pathological reflexes.  Sensory - Light touch, temperature/pinprick, vibration and proprioception, and Romberg testing were assessed and were normal.    Coordination - The patient had normal movements in the hands and feet with no ataxia or dysmetria.  Tremor was absent.  Gait and Station - The patient's transfers,  posture, gait, station, and turns were observed as normal.   Imaging  I personally reviewed the images and agree with the interpretation.  MRI and MRA 1. No acute intracranial abnormality. 2. Mild generalized atrophy and moderate diffuse white matter disease. This likely reflects the sequelae of chronic microvascular ischemia. 3. Atherosclerotic changes within the cavernous carotid arteries bilaterally without significant stenosis. 4. 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 5. Mild narrowing in the mid right M1 segment. 6. High-grade stenosis in the proximal left posterior cerebral artery. 2D echo - Left ventricle: The cavity size is reduced. There was mild   concentric hypertrophy. Systolic function was vigorous. The   estimated ejection fraction was in the range  of 65% to 70%.   Doppler parameters are consistent with abnormal left ventricular   relaxation (grade 1 diastolic dysfunction). The E/e&' ratio is   between 8-15, suggesting indeterminate LV filling pressure. - Left atrium: The atrium was normal in size. - Systemic veins: The IVC is small (<1.2 cm) and spontaneously   collapses, suggesting a low RA pressure <3. Impressions: - Hyperdynamic ventricle, EF [id number], diastolic dysfunction, small IVC   suggests low RA pressure. CUS BIlateral: mild soft plaque origin and proximal ICA. 1-39% ICA stenosis. Right vertebral artery flow is antegrade. Left vertebral artery is retrograde. suggests proximal subclavian/vertebral stenosis or occlusion.  Lab Review Component     Latest Ref Rng 01/11/2014  Cholesterol     0 - 200 mg/dL 190  Triglycerides     <150 mg/dL 212 (H)  HDL     >39 mg/dL 56  Total CHOL/HDL Ratio      3.4  VLDL     0 - 40 mg/dL 42 (H)  LDL (calc)     0 - 99 mg/dL 92  Hemoglobin A1C     <5.7 % 8.5 (H)  Mean Plasma Glucose     <117 mg/dL 197 (H)   Assessments: NIHSS: 0 Rankin: 0   Assessment and Plan:    In summary, [name] [name] is a 78 y.o. AA female with PMH of HTN, DM, HLD, OSA presents as new pt for hospital discharge follow up. She had event of slurry speech in 01/2014 in the setting with hypertensive urgency. As her Bp controlled, symptoms resolved. She had no other neurological deficit and neuro images were negative for acute stroke. Her symptoms most likely due to hypertensive encephalopathy, however due to her multiple stroke risk factors, TIA can not be completely ruled out. But anyways, she should be aggressive on stroke risk factor modification and stroke prevention.  - continue ASA and zocor for stroke prevention - monitor BP and glucose at home - follow up with PCP Dr. Baird Cancer for stroke risk factor modification - HTN, DM, HLD - RTC in 3 months.  I recommend aggressive blood pressure control with a goal <130/80 mm Hg.  Lipids should be managed intensively, with a goal LDL < 70 mg/dL.  I encouraged the patient to discuss these important issues with her primary care physician.  I counseled the patient on measures to reduce stroke risk, including the importance of medication compliance, risk factor control, exercise, healthy diet.  I reviewed stroke warning signs and symptoms and appropriate actions to take if such occurs.   Thank you very much for the opportunity to participate in the care of this patient.  Please do not hesitate to call if any questions or concerns arise.  Patient Instructions  1. Continue ASA and zocor for stroke prevention 2. continue to monitor BP and sugar level at home and record and bring over to Dr. Baird Cancer 3. Aggressively treat HTN, DM and HLD 4. Follow up with Dr. Baird Cancer closely for stroke risk factor modification 5. Follow up in clinic in 3 months.   [name] Hawking, MD PhD [facility] [address], [place], [place] [address] [phone]

## 2014-03-07 ENCOUNTER — Encounter (HOSPITAL_COMMUNITY): Payer: Self-pay | Admitting: Emergency Medicine

## 2014-03-07 ENCOUNTER — Emergency Department (HOSPITAL_COMMUNITY)
Admission: EM | Admit: 2014-03-07 | Discharge: 2014-03-08 | Disposition: A | Discharge status: Home / Self Care | Payer: Medicare Other | Attending: Emergency Medicine | Admitting: Emergency Medicine

## 2014-03-07 DIAGNOSIS — Z8673 Personal history of transient ischemic attack (TIA), and cerebral infarction without residual deficits: Secondary | ICD-10-CM | POA: Diagnosis not present

## 2014-03-07 DIAGNOSIS — Z79899 Other long term (current) drug therapy: Secondary | ICD-10-CM | POA: Insufficient documentation

## 2014-03-07 DIAGNOSIS — Z794 Long term (current) use of insulin: Secondary | ICD-10-CM | POA: Diagnosis not present

## 2014-03-07 DIAGNOSIS — E785 Hyperlipidemia, unspecified: Secondary | ICD-10-CM | POA: Diagnosis not present

## 2014-03-07 DIAGNOSIS — N183 Chronic kidney disease, stage 3 unspecified: Secondary | ICD-10-CM | POA: Diagnosis not present

## 2014-03-07 DIAGNOSIS — N390 Urinary tract infection, site not specified: Secondary | ICD-10-CM

## 2014-03-07 DIAGNOSIS — Z880 Allergy status to penicillin: Secondary | ICD-10-CM | POA: Diagnosis not present

## 2014-03-07 DIAGNOSIS — Z7982 Long term (current) use of aspirin: Secondary | ICD-10-CM | POA: Insufficient documentation

## 2014-03-07 DIAGNOSIS — E162 Hypoglycemia, unspecified: Secondary | ICD-10-CM

## 2014-03-07 DIAGNOSIS — I129 Hypertensive chronic kidney disease with stage 1 through stage 4 chronic kidney disease, or unspecified chronic kidney disease: Secondary | ICD-10-CM | POA: Insufficient documentation

## 2014-03-07 DIAGNOSIS — E1169 Type 2 diabetes mellitus with other specified complication: Secondary | ICD-10-CM | POA: Diagnosis not present

## 2014-03-07 DIAGNOSIS — D649 Anemia, unspecified: Secondary | ICD-10-CM | POA: Insufficient documentation

## 2014-03-07 DIAGNOSIS — R4182 Altered mental status, unspecified: Secondary | ICD-10-CM | POA: Insufficient documentation

## 2014-03-07 LAB — URINALYSIS, ROUTINE W REFLEX MICROSCOPIC
Bilirubin Urine: NEGATIVE
Glucose, UA: NEGATIVE mg/dL
Hgb urine dipstick: NEGATIVE
Ketones, ur: NEGATIVE mg/dL
Leukocytes, UA: NEGATIVE
Nitrite: NEGATIVE
Protein, ur: 100 mg/dL — AB
Specific Gravity, Urine: 1.013 (ref 1.005–1.030)
Urobilinogen, UA: 0.2 mg/dL (ref 0.0–1.0)
pH: 5 (ref 5.0–8.0)

## 2014-03-07 LAB — COMPREHENSIVE METABOLIC PANEL
ALT: 10 U/L (ref 0–35)
AST: 17 U/L (ref 0–37)
Albumin: 3.7 g/dL (ref 3.5–5.2)
Alkaline Phosphatase: 74 U/L (ref 39–117)
Anion gap: 15 (ref 5–15)
BUN: 39 mg/dL — ABNORMAL HIGH (ref 6–23)
CO2: 24 mEq/L (ref 19–32)
Calcium: 9.4 mg/dL (ref 8.4–10.5)
Chloride: 101 mEq/L (ref 96–112)
Creatinine, Ser: 2.19 mg/dL — ABNORMAL HIGH (ref 0.50–1.10)
GFR calc Af Amer: 24 mL/min — ABNORMAL LOW (ref >90)
GFR calc non Af Amer: 21 mL/min — ABNORMAL LOW (ref >90)
Glucose, Bld: 39 mg/dL — CL (ref 70–99)
Potassium: 3.3 mEq/L — ABNORMAL LOW (ref 3.7–5.3)
Sodium: 140 mEq/L (ref 137–147)
Total Bilirubin: 0.2 mg/dL — ABNORMAL LOW (ref 0.3–1.2)
Total Protein: 8.2 g/dL (ref 6.0–8.3)

## 2014-03-07 LAB — CBG MONITORING, ED
Glucose-Capillary: 38 mg/dL — CL (ref 70–99)
Glucose-Capillary: 97 mg/dL (ref 70–99)

## 2014-03-07 LAB — CBC
HCT: 29.7 % — ABNORMAL LOW (ref 36.0–46.0)
Hemoglobin: 9.3 g/dL — ABNORMAL LOW (ref 12.0–15.0)
MCH: 21.7 pg — ABNORMAL LOW (ref 26.0–34.0)
MCHC: 31.3 g/dL (ref 30.0–36.0)
MCV: 69.4 fL — ABNORMAL LOW (ref 78.0–100.0)
Platelets: 246 10*3/uL (ref 150–400)
RBC: 4.28 MIL/uL (ref 3.87–5.11)
RDW: 16.2 % — ABNORMAL HIGH (ref 11.5–15.5)
WBC: 6.6 10*3/uL (ref 4.0–10.5)

## 2014-03-07 LAB — URINE MICROSCOPIC-ADD ON

## 2014-03-07 NOTE — ED Notes (Signed)
The pt reports that she feels better

## 2014-03-07 NOTE — ED Notes (Addendum)
Thinks her blood sugar is low. Alert and oriented. Pt. Diaphoretic at triage. Doesn't recall last blood sugar.pt at [place]ingo when symptoms started. Husband states, "hot in the room."

## 2014-03-07 NOTE — ED Notes (Signed)
The pt feels better.  The pt just did not eat enough today

## 2014-03-07 NOTE — ED Notes (Signed)
The pt is alert but sluggish meal given after she drank 2 cups of oj

## 2014-03-08 LAB — POC OCCULT BLOOD, ED: Fecal Occult Bld: NEGATIVE

## 2014-03-08 LAB — CBG MONITORING, ED: Glucose-Capillary: 249 mg/dL — ABNORMAL HIGH (ref 70–99)

## 2014-03-08 MED ORDER — CEPHALEXIN 500 MG PO CAPS: 500.0000 mg | ORAL_CAPSULE | Freq: Four times a day (QID) | ORAL | Status: DC

## 2014-03-08 MED ORDER — CEPHALEXIN 250 MG PO CAPS
500.0000 mg | ORAL_CAPSULE | Freq: Once | ORAL | Status: AC
  Administered 2014-03-08: 500 mg via ORAL
  Filled 2014-03-08: qty 2

## 2014-03-08 NOTE — ED Provider Notes (Signed)
CSN: [id number]     Arrival date & time 03/07/14  2048 History   First MD Initiated Contact with Patient 03/07/14 2059     Chief Complaint  Patient presents with  . Hypoglycemia  . Altered Mental Status     (Consider location/radiation/quality/duration/timing/severity/associated sxs/prior Treatment) Patient is a 78 y.o. female presenting with hypoglycemia and altered mental status. The history is provided by the patient.  Hypoglycemia Associated symptoms: altered mental status   Altered Mental Status  She presents for altered mental status, and diaphoresis. Onset was while she was playing bingo, at a facility, tonight. No other known symptoms. She denies recent illnesses, including fever, chills, nausea, vomiting, weakness, dizziness, dysuria, or diarrhea. She's not had this previously. There are no other known modifying factors.  Past Medical History  Diagnosis Date  . Diabetes mellitus without complication   . Hyperlipidemia   . Hypertension   . CKD (chronic kidney disease) stage 3, GFR 30-59 ml/min   . TIA (transient ischemic attack) 2015   Past Surgical History  Procedure Laterality Date  . Abdominal hysterectomy  1974   Family History  Problem Relation Age of Onset  . Hypertension Other   . Hyperlipidemia Other   . Stroke Other   . Diabetes type II Mother   . Hypertension Mother   . Diabetes type II Father    History  Substance Use Topics  . Smoking status: Never Smoker   . Smokeless tobacco: Not on file  . Alcohol Use: No   OB History   Grav Para Term Preterm Abortions TAB SAB Ect Mult Living                 Review of Systems  All other systems reviewed and are negative.     Allergies  Penicillins  Home Medications   Prior to Admission medications   Medication Sig Start Date End Date Taking? Authorizing Provider  acetaminophen (TYLENOL) 500 MG tablet Take 1,000 mg by mouth every 8 (eight) hours as needed for mild pain.   Yes Historical Provider, MD   amLODipine (NORVASC) 10 MG tablet Take 10 mg by mouth daily.   Yes Historical Provider, MD  aspirin 325 MG tablet Take 325 mg by mouth daily.   Yes Historical Provider, MD  calcitRIOL (ROCALTROL) 0.25 MCG capsule Take 0.25 mcg by mouth 2 (two) times daily.  06/26/12  Yes Historical Provider, MD  cloNIDine (CATAPRES) 0.3 MG tablet Take 0.3 mg by mouth daily as needed (for blood pressure).   Yes Historical Provider, MD  furosemide (LASIX) 40 MG tablet Take 40 mg by mouth 2 (two) times daily.   Yes Historical Provider, MD  hydrALAZINE (APRESOLINE) 50 MG tablet Take 50 mg by mouth 3 (three) times daily.   Yes Historical Provider, MD  insulin aspart (NOVOLOG) 100 UNIT/ML injection Inject 2-10 Units into the skin See admin instructions. Increasing by 2 units.  Max >300 constitutes max of 10 unit dosing.   Yes Historical Provider, MD  insulin detemir (LEVEMIR) 100 UNIT/ML injection Inject 20 Units into the skin at bedtime.    Yes Historical Provider, MD  metoprolol succinate (TOPROL-XL) 25 MG 24 hr tablet Take 25 mg by mouth 2 (two) times daily.    Yes Historical Provider, MD  olmesartan (BENICAR) 40 MG tablet Take 40 mg by mouth daily.   Yes Historical Provider, MD  simvastatin (ZOCOR) 40 MG tablet Take 20 mg by mouth daily.  05/24/12  Yes Historical Provider, MD   BP 162/80  Pulse 73  Temp(Src) 98.4 F (36.9 C) (Oral)  Resp 18  SpO2 96% Physical Exam  Nursing note and vitals reviewed. Constitutional: She is oriented to person, place, and time. She appears well-developed and well-nourished.  HENT:  Head: Normocephalic and atraumatic.  Eyes: Conjunctivae and EOM are normal. Pupils are equal, round, and reactive to light.  Neck: Normal range of motion and phonation normal. Neck supple.  Cardiovascular: Normal rate, regular rhythm and intact distal pulses.   Pulmonary/Chest: Effort normal and breath sounds normal. She exhibits no tenderness.  Abdominal: Soft. She exhibits no distension. There is  no tenderness. There is no guarding.  Genitourinary:  Anus- ulcer, to tone, soft, brown stool in rectal vault, no rectal masses  Musculoskeletal: Normal range of motion.  Neurological: She is alert and oriented to person, place, and time. She exhibits normal muscle tone.  No dysarthria, aphasia or nystagmus  Skin: Skin is warm.  Diaphoretic  Psychiatric: She has a normal mood and affect. Her behavior is normal. Judgment and thought content normal.    ED Course  Procedures (including critical care time) Medications - No data to display  Patient Vitals for the past 24 hrs:  BP Temp Temp src Pulse Resp SpO2  03/07/14 2223 162/80 mmHg - - 73 18 96 %  03/07/14 2101 157/60 mmHg 98.4 F (36.9 C) Oral 75 11 95 %    12:49 AM Reevaluation with update and discussion. After initial assessment and treatment, an updated evaluation reveals no further complaints. Findings discussed with patient and family members, all questions answered. Byrant Valent L   CRITICAL CARE Performed by: Daleen Bo L Total critical care time: 35 minutes Critical care time was exclusive of separately billable procedures and treating other patients. Critical care was necessary to treat or prevent imminent or life-threatening deterioration. Critical care was time spent personally by me on the following activities: development of treatment plan with patient and/or surrogate as well as nursing, discussions with consultants, evaluation of patient's response to treatment, examination of patient, obtaining history from patient or surrogate, ordering and performing treatments and interventions, ordering and review of laboratory studies, ordering and review of radiographic studies, pulse oximetry and re-evaluation of patient's condition.   Labs Review Labs Reviewed  COMPREHENSIVE METABOLIC PANEL - Abnormal; Notable for the following:    Potassium 3.3 (*)    Glucose, Bld 39 (*)    BUN 39 (*)    Creatinine, Ser 2.19 (*)     Total Bilirubin 0.2 (*)    GFR calc non Af Amer 21 (*)    GFR calc Af Amer 24 (*)    All other components within normal limits  CBC - Abnormal; Notable for the following:    Hemoglobin 9.3 (*)    HCT 29.7 (*)    MCV 69.4 (*)    MCH 21.7 (*)    RDW 16.2 (*)    All other components within normal limits  URINALYSIS, ROUTINE W REFLEX MICROSCOPIC - Abnormal; Notable for the following:    Protein, ur 100 (*)    All other components within normal limits  CBG MONITORING, ED - Abnormal; Notable for the following:    Glucose-Capillary 38 (*)    All other components within normal limits  URINE CULTURE  URINE MICROSCOPIC-ADD ON  CBG MONITORING, ED  POC OCCULT BLOOD, ED    Imaging Review No results found.   EKG Interpretation   Date/Time:  Friday March 07 2014 21:05:45 EDT Ventricular Rate:  71 PR Interval:  171 QRS Duration: 100 QT Interval:  452 QTC Calculation: 491 R Axis:   21 Text Interpretation:  Sinus rhythm Borderline T wave abnormalities  Borderline prolonged QT interval Baseline wander in lead(s) I III aVL V2  since last tracing no significant change Confirmed by [facility]  MD, Tawonda Legaspi  [id number]) on 03/07/2014 10:36:38 PM      MDM   Final diagnoses:  Hypoglycemia  Urinary tract infection without hematuria, site unspecified  Anemia, unspecified anemia type    Hypoglycemia, causing AMS. Improved with oral nutrition. UTI likely contributing cause. Incidental Anemia without GI bleed. Doubt Sepsis, metabolic instability, or impending vascular collapse.  Nursing Notes Reviewed/ Care Coordinated Applicable Imaging Reviewed Interpretation of Laboratory Data incorporated into ED treatment  The patient appears reasonably screened and/or stabilized for discharge and I doubt any other medical condition or other [facility] requiring further screening, evaluation, or treatment in the ED at this time prior to discharge.  Plan: Home Medications- Keflex; Home Treatments- rest, increase oral  intake; return here if the recommended treatment, does not improve the symptoms; Recommended follow up- PCP 1 week and prn    Richarda Blade, MD 03/08/14 [phone]

## 2014-03-08 NOTE — Discharge Instructions (Signed)
Hypoglycemia °Hypoglycemia occurs when the glucose in your blood is too low. Glucose is a type of sugar that is your body's main energy source. Hormones, such as insulin and glucagon, control the level of glucose in the blood. Insulin lowers blood glucose and glucagon increases blood glucose. Having too much insulin in your blood stream, or not eating enough food containing sugar, can result in hypoglycemia. Hypoglycemia can happen to people with or without diabetes. It can develop quickly and can be a medical emergency.  °CAUSES  °· Missing or delaying meals. °· Not eating enough carbohydrates at meals. °· Taking too much diabetes medicine. °· Not timing your oral diabetes medicine or insulin doses with meals, snacks, and exercise. °· Nausea and vomiting. °· Certain medicines. °· Severe illnesses, such as hepatitis, kidney disorders, and certain eating disorders. °· Increased activity or exercise without eating something extra or adjusting medicines. °· Drinking too much alcohol. °· A nerve disorder that affects body functions like your heart rate, blood pressure, and digestion (autonomic neuropathy). °· A condition where the stomach muscles do not function properly (gastroparesis). Therefore, medicines and food may not absorb properly. °· Rarely, a tumor of the pancreas can produce too much insulin. °SYMPTOMS  °· Hunger. °· Sweating (diaphoresis). °· Change in body temperature. °· Shakiness. °· Headache. °· Anxiety. °· Lightheadedness. °· Irritability. °· Difficulty concentrating. °· Dry mouth. °· Tingling or numbness in the hands or feet. °· Restless sleep or sleep disturbances. °· Altered speech and coordination. °· Change in mental status. °· Seizures or prolonged convulsions. °· Combativeness. °· Drowsiness (lethargic). °· Weakness. °· Increased heart rate or palpitations. °· Confusion. °· Pale, gray skin color. °· Blurred or double vision. °· Fainting. °DIAGNOSIS  °A physical exam and medical history will be  performed. Your caregiver may make a diagnosis based on your symptoms. Blood tests and other lab tests may be performed to confirm a diagnosis. Once the diagnosis is made, your caregiver will see if your signs and symptoms go away once your blood glucose is raised.  °TREATMENT  °Usually, you can easily treat your hypoglycemia when you notice symptoms. °· Check your blood glucose. If it is less than 70 mg/dl, take one of the following:   °¨ 3-4 glucose tablets.   °¨ ½ cup juice.   °¨ ½ cup regular soda.   °¨ 1 cup skim milk.   °¨ ½-1 tube of glucose gel.   °¨ 5-6 hard candies.   °· Avoid high-fat drinks or food that may delay a rise in blood glucose levels. °· Do not take more than the recommended amount of sugary foods, drinks, gel, or tablets. Doing so will cause your blood glucose to go too high.   °· Wait 10-15 minutes and recheck your blood glucose. If it is still less than 70 mg/dl or below your target range, repeat treatment.   °· Eat a snack if it is more than 1 hour until your next meal.   °There may be a time when your blood glucose may go so low that you are unable to treat yourself at home when you start to notice symptoms. You may need someone to help you. You may even faint or be unable to swallow. If you cannot treat yourself, someone will need to bring you to the hospital.  °HOME CARE INSTRUCTIONS °· If you have diabetes, follow your diabetes management plan by: °¨ Taking your medicines as directed. °¨ Following your exercise plan. °¨ Following your meal plan. Do not skip meals. Eat on time. °¨ Testing your blood   glucose regularly. Check your blood glucose before and after exercise. If you exercise longer or different than usual, be sure to check blood glucose more frequently.  Wearing your medical alert jewelry that says you have diabetes.  Identify the cause of your hypoglycemia. Then, develop ways to prevent the recurrence of hypoglycemia.  Do not take a hot bath or shower right after an  insulin shot.  Always carry treatment with you. Glucose tablets are the easiest to carry.  If you are going to drink alcohol, drink it only with meals.  Tell friends or family members ways to keep you safe during a seizure. This may include removing hard or sharp objects from the area or turning you on your side.  Maintain a healthy weight. SEEK MEDICAL CARE IF:   You are having problems keeping your blood glucose in your target range.  You are having frequent episodes of hypoglycemia.  You feel you might be having side effects from your medicines.  You are not sure why your blood glucose is dropping so low.  You notice a change in vision or a new problem with your vision. SEEK IMMEDIATE MEDICAL CARE IF:   Confusion develops.  A change in mental status occurs.  The inability to swallow develops.  Fainting occurs. Document Released: 07/25/2005 Document Revised: 07/30/2013 Document Reviewed: 11/21/2011 [facility] Patient Information 2015 [place], [place]. This information is not intended to replace advice given to you by your health care provider. Make sure you discuss any questions you have with your health care provider.  Urinary Tract Infection Urinary tract infections (UTIs) can develop anywhere along your urinary tract. Your urinary tract is your body's drainage system for removing wastes and extra water. Your urinary tract includes two kidneys, two ureters, a bladder, and a urethra. Your kidneys are a pair of bean-shaped organs. Each kidney is about the size of your fist. They are located below your ribs, one on each side of your spine. CAUSES Infections are caused by microbes, which are microscopic organisms, including fungi, viruses, and bacteria. These organisms are so small that they can only be seen through a microscope. Bacteria are the microbes that most commonly cause UTIs. SYMPTOMS  Symptoms of UTIs may vary by age and gender of the patient and by the location of the  infection. Symptoms in young women typically include a frequent and intense urge to urinate and a painful, burning feeling in the bladder or urethra during urination. Older women and men are more likely to be tired, shaky, and weak and have muscle aches and abdominal pain. A fever may mean the infection is in your kidneys. Other symptoms of a kidney infection include pain in your back or sides below the ribs, nausea, and vomiting. DIAGNOSIS To diagnose a UTI, your caregiver will ask you about your symptoms. Your caregiver also will ask to provide a urine sample. The urine sample will be tested for bacteria and white blood cells. White blood cells are made by your body to help fight infection. TREATMENT  Typically, UTIs can be treated with medication. Because most UTIs are caused by a bacterial infection, they usually can be treated with the use of antibiotics. The choice of antibiotic and length of treatment depend on your symptoms and the type of bacteria causing your infection. HOME CARE INSTRUCTIONS  If you were prescribed antibiotics, take them exactly as your caregiver instructs you. Finish the medication even if you feel better after you have only taken some of the medication.  Drink enough water and fluids to keep your urine clear or pale yellow.  Avoid caffeine, tea, and carbonated beverages. They tend to irritate your bladder.  Empty your bladder often. Avoid holding urine for Abt periods of time.  Empty your bladder before and after sexual intercourse.  After a bowel movement, women should cleanse from front to back. Use each tissue only once. SEEK MEDICAL CARE IF:   You have back pain.  You develop a fever.  Your symptoms do not begin to resolve within 3 days. SEEK IMMEDIATE MEDICAL CARE IF:   You have severe back pain or lower abdominal pain.  You develop chills.  You have nausea or vomiting.  You have continued burning or discomfort with urination. MAKE SURE YOU:    Understand these instructions.  Will watch your condition.  Will get help right away if you are not doing well or get worse. Document Released: 05/04/2005 Document Revised: 01/24/2012 Document Reviewed: 09/02/2011 [facility] Patient Information 2015 [place], [place]. This information is not intended to replace advice given to you by your health care provider. Make sure you discuss any questions you have with your health care provider.  Anemia, Nonspecific Anemia is a condition in which the concentration of red blood cells or hemoglobin in the blood is below normal. Hemoglobin is a substance in red blood cells that carries oxygen to the tissues of the body. Anemia results in not enough oxygen reaching these tissues.  CAUSES  Common causes of anemia include:   Excessive bleeding. Bleeding may be internal or external. This includes excessive bleeding from periods (in women) or from the intestine.   Poor nutrition.   Chronic kidney, thyroid, and liver disease.  Bone marrow disorders that decrease red blood cell production.  Cancer and treatments for cancer.  HIV, AIDS, and their treatments.  Spleen problems that increase red blood cell destruction.  Blood disorders.  Excess destruction of red blood cells due to infection, medicines, and autoimmune disorders. SIGNS AND SYMPTOMS   Minor weakness.   Dizziness.   Headache.  Palpitations.   Shortness of breath, especially with exercise.   Paleness.  Cold sensitivity.  Indigestion.  Nausea.  Difficulty sleeping.  Difficulty concentrating. Symptoms may occur suddenly or they may develop slowly.  DIAGNOSIS  Additional blood tests are often needed. These help your health care provider determine the best treatment. Your health care provider will check your stool for blood and look for other causes of blood loss.  TREATMENT  Treatment varies depending on the cause of the anemia. Treatment can include:   Supplements  of iron, vitamin 123456, or folic acid.   Hormone medicines.   A blood transfusion. This may be needed if blood loss is severe.   Hospitalization. This may be needed if there is significant continual blood loss.   Dietary changes.  Spleen removal. HOME CARE INSTRUCTIONS Keep all follow-up appointments. It often takes many weeks to correct anemia, and having your health care provider check on your condition and your response to treatment is very important. SEEK IMMEDIATE MEDICAL CARE IF:   You develop extreme weakness, shortness of breath, or chest pain.   You become dizzy or have trouble concentrating.  You develop heavy vaginal bleeding.   You develop a rash.   You have bloody or black, tarry stools.   You faint.   You vomit up blood.   You vomit repeatedly.   You have abdominal pain.  You have a fever or persistent symptoms for more than  2-3 days.   You have a fever and your symptoms suddenly get worse.   You are dehydrated.  MAKE SURE YOU:  Understand these instructions.  Will watch your condition.  Will get help right away if you are not doing well or get worse. Document Released: 09/01/2004 Document Revised: 03/27/2013 Document Reviewed: 01/18/2013 [facility] Patient Information 2015 [place], [place]. This information is not intended to replace advice given to you by your health care provider. Make sure you discuss any questions you have with your health care provider.

## 2014-03-09 LAB — URINE CULTURE
Colony Count: NO GROWTH
Culture: NO GROWTH

## 2014-03-21 ENCOUNTER — Encounter (HOSPITAL_COMMUNITY)
Admission: RE | Admit: 2014-03-21 | Discharge: 2014-03-21 | Disposition: A | Discharge status: Home / Self Care | Payer: Medicare Other | Source: Ambulatory Visit | Attending: Nephrology | Admitting: Nephrology

## 2014-03-21 DIAGNOSIS — N183 Chronic kidney disease, stage 3 unspecified: Secondary | ICD-10-CM | POA: Insufficient documentation

## 2014-03-21 DIAGNOSIS — D638 Anemia in other chronic diseases classified elsewhere: Secondary | ICD-10-CM | POA: Insufficient documentation

## 2014-03-21 LAB — RENAL FUNCTION PANEL
Albumin: 3.7 g/dL (ref 3.5–5.2)
Anion gap: 16 — ABNORMAL HIGH (ref 5–15)
BUN: 30 mg/dL — ABNORMAL HIGH (ref 6–23)
CO2: 25 mEq/L (ref 19–32)
Calcium: 9 mg/dL (ref 8.4–10.5)
Chloride: 97 mEq/L (ref 96–112)
Creatinine, Ser: 2.09 mg/dL — ABNORMAL HIGH (ref 0.50–1.10)
GFR calc Af Amer: 25 mL/min — ABNORMAL LOW (ref >90)
GFR calc non Af Amer: 22 mL/min — ABNORMAL LOW (ref >90)
Glucose, Bld: 232 mg/dL — ABNORMAL HIGH (ref 70–99)
Phosphorus: 2.9 mg/dL (ref 2.3–4.6)
Potassium: 3.6 mEq/L — ABNORMAL LOW (ref 3.7–5.3)
Sodium: 138 mEq/L (ref 137–147)

## 2014-03-21 LAB — IRON AND TIBC
Iron: 40 ug/dL — ABNORMAL LOW (ref 42–135)
Saturation Ratios: 17 % — ABNORMAL LOW (ref 20–55)
TIBC: 237 ug/dL — ABNORMAL LOW (ref 250–470)
UIBC: 197 ug/dL (ref 125–400)

## 2014-03-21 LAB — POCT HEMOGLOBIN-HEMACUE: Hemoglobin: 9.9 g/dL — ABNORMAL LOW (ref 12.0–15.0)

## 2014-03-21 LAB — FERRITIN: Ferritin: 535 ng/mL — ABNORMAL HIGH (ref 10–291)

## 2014-03-21 MED ORDER — EPOETIN ALFA 20000 UNIT/ML IJ SOLN
INTRAMUSCULAR | Status: AC
  Administered 2014-03-21: 20000 [IU] via SUBCUTANEOUS
  Filled 2014-03-21: qty 1

## 2014-03-21 MED ORDER — EPOETIN ALFA 10000 UNIT/ML IJ SOLN
INTRAMUSCULAR | Status: AC
  Filled 2014-03-21: qty 1

## 2014-03-21 MED ORDER — EPOETIN ALFA 10000 UNIT/ML IJ SOLN
30000.0000 [IU] | INTRAMUSCULAR | Status: DC
  Administered 2014-03-21: 10000 [IU] via SUBCUTANEOUS

## 2014-03-24 LAB — PTH, INTACT AND CALCIUM
Calcium, Total (PTH): 8.9 mg/dL (ref 8.4–10.5)
PTH: 189.5 pg/mL — ABNORMAL HIGH (ref 14.0–72.0)

## 2014-05-02 ENCOUNTER — Encounter (HOSPITAL_COMMUNITY)
Admission: RE | Admit: 2014-05-02 | Discharge: 2014-05-02 | Disposition: A | Discharge status: Home / Self Care | Payer: Medicare Other | Source: Ambulatory Visit | Attending: Nephrology | Admitting: Nephrology

## 2014-05-02 DIAGNOSIS — N183 Chronic kidney disease, stage 3 unspecified: Secondary | ICD-10-CM | POA: Insufficient documentation

## 2014-05-02 DIAGNOSIS — D638 Anemia in other chronic diseases classified elsewhere: Secondary | ICD-10-CM | POA: Insufficient documentation

## 2014-05-02 LAB — POCT HEMOGLOBIN-HEMACUE: Hemoglobin: 9.8 g/dL — ABNORMAL LOW (ref 12.0–15.0)

## 2014-05-02 MED ORDER — EPOETIN ALFA 10000 UNIT/ML IJ SOLN
30000.0000 [IU] | INTRAMUSCULAR | Status: DC
  Administered 2014-05-02: 10000 [IU] via SUBCUTANEOUS

## 2014-05-02 MED ORDER — EPOETIN ALFA 10000 UNIT/ML IJ SOLN
INTRAMUSCULAR | Status: AC
  Administered 2014-05-02: 10000 [IU] via SUBCUTANEOUS
  Filled 2014-05-02: qty 1

## 2014-05-02 MED ORDER — EPOETIN ALFA 20000 UNIT/ML IJ SOLN
INTRAMUSCULAR | Status: AC
  Administered 2014-05-02: 20000 [IU] via SUBCUTANEOUS
  Filled 2014-05-02: qty 1

## 2014-06-11 ENCOUNTER — Ambulatory Visit (INDEPENDENT_AMBULATORY_CARE_PROVIDER_SITE_OTHER): Payer: Medicare Other | Admitting: Neurology

## 2014-06-11 ENCOUNTER — Encounter: Payer: Self-pay | Admitting: Neurology

## 2014-06-11 VITALS — BP 184/87 | HR 71 | Ht 60.5 in | Wt 190.0 lb

## 2014-06-11 DIAGNOSIS — E119 Type 2 diabetes mellitus without complications: Secondary | ICD-10-CM | POA: Insufficient documentation

## 2014-06-11 DIAGNOSIS — E785 Hyperlipidemia, unspecified: Secondary | ICD-10-CM

## 2014-06-11 DIAGNOSIS — I10 Essential (primary) hypertension: Secondary | ICD-10-CM

## 2014-06-11 DIAGNOSIS — G4733 Obstructive sleep apnea (adult) (pediatric): Secondary | ICD-10-CM

## 2014-06-11 DIAGNOSIS — I674 Hypertensive encephalopathy: Secondary | ICD-10-CM

## 2014-06-11 NOTE — Progress Notes (Signed)
NEUROLOGY CLINIC FOLLOW UP NOTE  NAME: [name] [name] DOB: 10/30/1935  HPI: [name] [name] is a 78 y.o. female with a PMH of HTN, DM, HLD, OSA on CPAP presented as new pt for hospital discharge in 01/2014. She initially was seen in ER for hypertensive urgency and was discharged to home. Later on in the evening she developed slurred speech and family brought her back in. Her blood pressure initially was up to 243/112. She has hx of CKD and have been getting iron infusions 2 days prior to admission and she could not complete it due to severe HTN and was sent to ER at that time but not admitted. After admission, her BP was gradually normalized with multiple BP medications. Her slurry speech was resolved after BP in control. Stroke work up included MRI no acute stroke and MRA with atherosclerotic cerebrovascular disease and high grade stenosis in posterior communicating artery. 2D echo and CUS unremarkable and she was discharged with ASA and zocor for stroke prevention. Today she was here for hospital discharge follow up.  Follow up 03/06/14 - her BP was under control, around 140s/70s, today in clinic 147/66. His glucose monitor at home still showed up and down and her A1C was 8.5 during admission. She has hx of CKD not on dialysis. She has OSA and on CPAP for a month now. She denies smoking, alcohol and illicit drugs.  Interval history: During the interval time, she was doing fine. However, her BP was high today at 184/87. She stated that whenever she goes to doctors office and BP was high. She did not check her BP regularly at home though. She check glucose level daily and today 101. She is on multiple BP meds now. She has no neurological complains. She just wants to lose more weight.  Outside reports reviewed: chart during admission in 01/2014.  Past Medical History  Diagnosis Date  . Diabetes mellitus without complication   . Hyperlipidemia   . Hypertension   . CKD (chronic kidney  disease) stage 3, GFR 30-59 ml/min   . TIA (transient ischemic attack) 2015   Past Surgical History  Procedure Laterality Date  . Abdominal hysterectomy  1974   Family History  Problem Relation Age of Onset  . Hypertension Other   . Hyperlipidemia Other   . Stroke Other   . Diabetes type II Mother   . Hypertension Mother   . Diabetes type II Father    Current Outpatient Prescriptions  Medication Sig Dispense Refill  . acetaminophen (TYLENOL) 500 MG tablet Take 1,000 mg by mouth every 8 (eight) hours as needed for mild pain.    Marland Kitchen amLODipine (NORVASC) 10 MG tablet Take 10 mg by mouth daily.    Marland Kitchen aspirin 325 MG tablet Take 325 mg by mouth daily.    Marland Kitchen atorvastatin (LIPITOR) 20 MG tablet   0  . calcitRIOL (ROCALTROL) 0.25 MCG capsule Take 0.25 mcg by mouth 2 (two) times daily.     . cephALEXin (KEFLEX) 500 MG capsule Take 1 capsule (500 mg total) by mouth 4 (four) times daily. 28 capsule 0  . cloNIDine (CATAPRES) 0.1 MG tablet   0  . furosemide (LASIX) 40 MG tablet Take 40 mg by mouth 2 (two) times daily.    . hydrALAZINE (APRESOLINE) 25 MG tablet Take 25 mg by mouth 2 (two) times daily.  0  . insulin detemir (LEVEMIR) 100 UNIT/ML injection Inject 20 Units into the skin at bedtime.     Marland Kitchen  loratadine (CLARITIN) 10 MG tablet Take 10 mg by mouth daily.  0  . metoprolol (LOPRESSOR) 100 MG tablet Take 100 mg by mouth 2 (two) times daily.  0  . olmesartan (BENICAR) 40 MG tablet Take 40 mg by mouth daily.    Glory Rosebush DELICA LANCETS [id number] MISC   0  . ONETOUCH VERIO test strip   0  . simvastatin (ZOCOR) 40 MG tablet Take 20 mg by mouth daily.     . Vitamin D, Ergocalciferol, (DRISDOL) 50000 UNITS CAPS capsule daily.  0   No current facility-administered medications for this visit.   Allergies  Allergen Reactions  . Penicillins Swelling and Rash   History   Social History  . Marital Status: Married    Spouse Name: N/A    Number of Children: 6  . Years of Education: 12th    Occupational History  . retired    Social History Main Topics  . Smoking status: Never Smoker   . Smokeless tobacco: Never Used  . Alcohol Use: No  . Drug Use: No  . Sexual Activity: Not Currently   Other Topics Concern  . Not on file   Social History Narrative   Patient lives with her husband    Patient is right handed   Patient drinks tea    Review of Systems Full 14 system review of systems performed and notable only for those listed, all others are neg:  Constitutional: N/A  Cardiovascular: swelling in legs  Ear/Nose/Throat: N/A  Skin: N/A  Eyes: N/A  Respiratory: N/A  Gastroitestinal: constipation  Hematology/Lymphatic: N/A  Endocrine: N/A  Musculoskeletal: N/A  Allergy/Immunology: N/A  Neurological: N/A  Psychiatric: N/A  Physical Exam  Filed Vitals:   06/11/14 1027  BP: 184/87  Pulse: 71    General - Well nourished, well developed, in no apparent distress.  Ophthalmologic - Sharp disc margins OU.  Cardiovascular - Regular rate and rhythm with no murmur. Carotid pulses were 2+ without bruits .   Neck - supple, no nuchal rigidity .  Mental Status -  Level of arousal and orientation to time, place, and person were intact. Language including expression, naming, repetition, comprehension, reading, and writing was assessed and found intact. Attention span and concentration were normal. Recent and remote memory were intact. Fund of Knowledge was assessed and was intact.  Cranial Nerves II - XII - II - Vision intact OU. III, IV, VI - Extraocular movements intact. V - Facial sensation intact bilaterally. VII - Facial movement intact bilaterally. VIII - Hearing & vestibular intact bilaterally. X - Palate elevates symmetrically. XI - Chin turning & shoulder shrug intact bilaterally. XII - Tongue protrusion intact.  Motor Strength - The patient's strength was normal in all extremities and pronator drift was absent.  Bulk was normal and fasciculations  were absent.   Motor Tone - Muscle tone was assessed at the neck and appendages and was normal.  Reflexes - The patient's reflexes were normal in all extremities and she had no pathological reflexes.  Sensory - Light touch, temperature/pinprick, vibration and proprioception, and Romberg testing were assessed and were normal.    Coordination - The patient had normal movements in the hands and feet with no ataxia or dysmetria.  Tremor was absent.  Gait and Station - The patient's transfers, posture, gait, station, and turns were observed as normal.   Imaging  I personally reviewed the images and agree with the interpretation.  MRI and MRA 1. No acute intracranial abnormality. 2.  Mild generalized atrophy and moderate diffuse white matter disease. This likely reflects the sequelae of chronic microvascular ischemia. 3. Atherosclerotic changes within the cavernous carotid arteries bilaterally without significant stenosis. 4. 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 5. Mild narrowing in the mid right M1 segment. 6. High-grade stenosis in the proximal left posterior cerebral artery. 2D echo - Left ventricle: The cavity size is reduced. There was mild   concentric hypertrophy. Systolic function was vigorous. The   estimated ejection fraction was in the range of 65% to 70%.   Doppler parameters are consistent with abnormal left ventricular   relaxation (grade 1 diastolic dysfunction). The E/e&' ratio is   between 8-15, suggesting indeterminate LV filling pressure. - Left atrium: The atrium was normal in size. - Systemic veins: The IVC is small (<1.2 cm) and spontaneously   collapses, suggesting a low RA pressure <3. Impressions: - Hyperdynamic ventricle, EF [id number], diastolic dysfunction, small IVC   suggests low RA pressure. CUS BIlateral: mild soft plaque origin and proximal ICA. 1-39% ICA stenosis. Right vertebral artery flow is  antegrade. Left vertebral artery is retrograde. suggests proximal subclavian/vertebral stenosis or occlusion.  Lab Review Component     Latest Ref Rng 01/11/2014  Cholesterol     0 - 200 mg/dL 190  Triglycerides     <150 mg/dL 212 (H)  HDL     >39 mg/dL 56  Total CHOL/HDL Ratio      3.4  VLDL     0 - 40 mg/dL 42 (H)  LDL (calc)     0 - 99 mg/dL 92  Hemoglobin A1C     <5.7 % 8.5 (H)  Mean Plasma Glucose     <117 mg/dL 197 (H)   Assessments: NIHSS: 0 Rankin: 0   Assessment and Plan:   In summary, Yailin Sweazy is a 78 y.o. AA female with PMH of HTN, DM, HLD, OSA presents as new pt for hospital discharge follow up. She had event of slurry speech in 01/2014 in the setting with hypertensive urgency. As her Bp controlled, symptoms resolved. She had no other neurological deficit and neuro images were negative for acute stroke. Her symptoms most likely due to hypertensive encephalopathy, however due to her multiple stroke risk factors, TIA can not be completely ruled out. But anyways, she should be aggressive on stroke risk factor modification and stroke prevention. During the interval time, she is following up with PCP Dr. Biagio Quint regularly.  - continue ASA and lipitor for stroke prevention - monitor BP and glucose at home - follow up with PCP Dr. Baird Cancer for stroke risk factor modification - HTN, DM, HLD - regular exercise - RTC PRN.   Rosalin Hawking, MD PhD [facility] [address], [place], [place] [address] [phone]   Patient Instructions  - continue ASA 325mg  and lipitor for stroke prevention - continue check glucose at home - check BP at home and make record and bring over PCP for medication adjustment - Follow up with your primary care physician for stroke risk factor modification. Recommend maintain blood pressure goal <130/80, diabetes with hemoglobin A1c goal below 6.5% and lipids with LDL cholesterol goal below 70 mg/dL.  - regular  exercise and lose weight - follow up as needed

## 2014-06-11 NOTE — Patient Instructions (Signed)
-   continue ASA 325mg  and lipitor for stroke prevention - continue check glucose at home - check BP at home and make record and bring over PCP for medication adjustment - Follow up with your primary care physician for stroke risk factor modification. Recommend maintain blood pressure goal <130/80, diabetes with hemoglobin A1c goal below 6.5% and lipids with LDL cholesterol goal below 70 mg/dL.  - regular exercise and lose weight - follow up as needed

## 2014-06-13 ENCOUNTER — Encounter (HOSPITAL_COMMUNITY)
Admission: RE | Admit: 2014-06-13 | Discharge: 2014-06-13 | Disposition: A | Discharge status: Home / Self Care | Payer: Medicare Other | Source: Ambulatory Visit | Attending: Nephrology | Admitting: Nephrology

## 2014-06-13 DIAGNOSIS — N183 Chronic kidney disease, stage 3 (moderate): Secondary | ICD-10-CM | POA: Insufficient documentation

## 2014-06-13 DIAGNOSIS — D638 Anemia in other chronic diseases classified elsewhere: Secondary | ICD-10-CM | POA: Diagnosis not present

## 2014-06-13 LAB — RENAL FUNCTION PANEL
Albumin: 3.2 g/dL — ABNORMAL LOW (ref 3.5–5.2)
Anion gap: 15 (ref 5–15)
BUN: 41 mg/dL — ABNORMAL HIGH (ref 6–23)
CO2: 26 mEq/L (ref 19–32)
Calcium: 9.2 mg/dL (ref 8.4–10.5)
Chloride: 99 mEq/L (ref 96–112)
Creatinine, Ser: 2.39 mg/dL — ABNORMAL HIGH (ref 0.50–1.10)
GFR calc Af Amer: 22 mL/min — ABNORMAL LOW (ref >90)
GFR calc non Af Amer: 19 mL/min — ABNORMAL LOW (ref >90)
Glucose, Bld: 245 mg/dL — ABNORMAL HIGH (ref 70–99)
Phosphorus: 4.8 mg/dL — ABNORMAL HIGH (ref 2.3–4.6)
Potassium: 4.2 mEq/L (ref 3.7–5.3)
Sodium: 140 mEq/L (ref 137–147)

## 2014-06-13 LAB — IRON AND TIBC
Iron: 48 ug/dL (ref 42–135)
Saturation Ratios: 23 % (ref 20–55)
TIBC: 213 ug/dL — ABNORMAL LOW (ref 250–470)
UIBC: 165 ug/dL (ref 125–400)

## 2014-06-13 LAB — FERRITIN: Ferritin: 519 ng/mL — ABNORMAL HIGH (ref 10–291)

## 2014-06-13 LAB — POCT HEMOGLOBIN-HEMACUE: Hemoglobin: 9.6 g/dL — ABNORMAL LOW (ref 12.0–15.0)

## 2014-06-13 MED ORDER — EPOETIN ALFA 10000 UNIT/ML IJ SOLN
INTRAMUSCULAR | Status: AC
  Administered 2014-06-13: 10000 [IU] via SUBCUTANEOUS
  Filled 2014-06-13: qty 1

## 2014-06-13 MED ORDER — EPOETIN ALFA 20000 UNIT/ML IJ SOLN
INTRAMUSCULAR | Status: AC
  Administered 2014-06-13: 20000 [IU] via SUBCUTANEOUS
  Filled 2014-06-13: qty 1

## 2014-06-13 MED ORDER — EPOETIN ALFA 10000 UNIT/ML IJ SOLN: 30000.0000 [IU] | INTRAMUSCULAR | Status: DC

## 2014-07-24 ENCOUNTER — Other Ambulatory Visit (HOSPITAL_COMMUNITY): Payer: Self-pay | Admitting: *Deleted

## 2014-07-25 ENCOUNTER — Encounter (HOSPITAL_COMMUNITY)
Admission: RE | Admit: 2014-07-25 | Discharge: 2014-07-25 | Disposition: A | Discharge status: Home / Self Care | Payer: Medicare Other | Source: Ambulatory Visit | Attending: Nephrology | Admitting: Nephrology

## 2014-07-25 DIAGNOSIS — D638 Anemia in other chronic diseases classified elsewhere: Secondary | ICD-10-CM | POA: Insufficient documentation

## 2014-07-25 DIAGNOSIS — N183 Chronic kidney disease, stage 3 (moderate): Secondary | ICD-10-CM | POA: Insufficient documentation

## 2014-07-25 LAB — POCT HEMOGLOBIN-HEMACUE: Hemoglobin: 10 g/dL — ABNORMAL LOW (ref 12.0–15.0)

## 2014-07-25 MED ORDER — EPOETIN ALFA 10000 UNIT/ML IJ SOLN
INTRAMUSCULAR | Status: AC
  Administered 2014-07-25: 10000 [IU] via SUBCUTANEOUS
  Filled 2014-07-25: qty 1

## 2014-07-25 MED ORDER — EPOETIN ALFA 10000 UNIT/ML IJ SOLN: 30000.0000 [IU] | INTRAMUSCULAR | Status: DC

## 2014-07-25 MED ORDER — EPOETIN ALFA 20000 UNIT/ML IJ SOLN
INTRAMUSCULAR | Status: AC
  Administered 2014-07-25: 20000 [IU] via SUBCUTANEOUS
  Filled 2014-07-25: qty 1

## 2014-07-26 ENCOUNTER — Other Ambulatory Visit: Payer: Self-pay | Admitting: Nurse Practitioner

## 2014-07-28 LAB — PTH, INTACT AND CALCIUM
Calcium, Total (PTH): 9.5 mg/dL (ref 8.4–10.5)
PTH: 95 pg/mL — ABNORMAL HIGH (ref 14–64)

## 2014-09-05 ENCOUNTER — Encounter (HOSPITAL_COMMUNITY)
Admission: RE | Admit: 2014-09-05 | Discharge: 2014-09-05 | Disposition: A | Discharge status: Home / Self Care | Payer: Medicare Other | Source: Ambulatory Visit | Attending: Nephrology | Admitting: Nephrology

## 2014-09-05 DIAGNOSIS — N183 Chronic kidney disease, stage 3 (moderate): Secondary | ICD-10-CM | POA: Insufficient documentation

## 2014-09-05 DIAGNOSIS — D638 Anemia in other chronic diseases classified elsewhere: Secondary | ICD-10-CM | POA: Diagnosis not present

## 2014-09-05 LAB — RENAL FUNCTION PANEL
Albumin: 3 g/dL — ABNORMAL LOW (ref 3.5–5.2)
Anion gap: 7 (ref 5–15)
BUN: 36 mg/dL — ABNORMAL HIGH (ref 6–23)
CO2: 31 mmol/L (ref 19–32)
Calcium: 9.5 mg/dL (ref 8.4–10.5)
Chloride: 99 mmol/L (ref 96–112)
Creatinine, Ser: 2.52 mg/dL — ABNORMAL HIGH (ref 0.50–1.10)
GFR calc Af Amer: 20 mL/min — ABNORMAL LOW (ref >90)
GFR calc non Af Amer: 17 mL/min — ABNORMAL LOW (ref >90)
Glucose, Bld: 279 mg/dL — ABNORMAL HIGH (ref 70–99)
Phosphorus: 4.6 mg/dL (ref 2.3–4.6)
Potassium: 3.4 mmol/L — ABNORMAL LOW (ref 3.5–5.1)
Sodium: 137 mmol/L (ref 135–145)

## 2014-09-05 LAB — FERRITIN: Ferritin: 537 ng/mL — ABNORMAL HIGH (ref 10–291)

## 2014-09-05 LAB — IRON AND TIBC
Iron: 50 ug/dL (ref 42–145)
Saturation Ratios: 23 % (ref 20–55)
TIBC: 216 ug/dL — ABNORMAL LOW (ref 250–470)
UIBC: 166 ug/dL (ref 125–400)

## 2014-09-05 LAB — POCT HEMOGLOBIN-HEMACUE: Hemoglobin: 9.3 g/dL — ABNORMAL LOW (ref 12.0–15.0)

## 2014-09-05 MED ORDER — EPOETIN ALFA 10000 UNIT/ML IJ SOLN: 30000.0000 [IU] | INTRAMUSCULAR | Status: DC

## 2014-09-05 MED ORDER — EPOETIN ALFA 10000 UNIT/ML IJ SOLN
INTRAMUSCULAR | Status: AC
  Administered 2014-09-05: 10000 [IU] via SUBCUTANEOUS
  Filled 2014-09-05: qty 1

## 2014-09-05 MED ORDER — EPOETIN ALFA 20000 UNIT/ML IJ SOLN
INTRAMUSCULAR | Status: AC
  Administered 2014-09-05: 20000 [IU] via SUBCUTANEOUS
  Filled 2014-09-05: qty 1

## 2014-10-16 ENCOUNTER — Other Ambulatory Visit (HOSPITAL_COMMUNITY): Payer: Self-pay | Admitting: *Deleted

## 2014-10-17 ENCOUNTER — Encounter (HOSPITAL_COMMUNITY)
Admission: RE | Admit: 2014-10-17 | Discharge: 2014-10-17 | Disposition: A | Discharge status: Home / Self Care | Payer: Medicare Other | Source: Ambulatory Visit | Attending: Nephrology | Admitting: Nephrology

## 2014-10-17 DIAGNOSIS — N183 Chronic kidney disease, stage 3 (moderate): Secondary | ICD-10-CM | POA: Diagnosis not present

## 2014-10-17 DIAGNOSIS — D638 Anemia in other chronic diseases classified elsewhere: Secondary | ICD-10-CM | POA: Diagnosis present

## 2014-10-17 LAB — POCT HEMOGLOBIN-HEMACUE: Hemoglobin: 9.3 g/dL — ABNORMAL LOW (ref 12.0–15.0)

## 2014-10-17 MED ORDER — EPOETIN ALFA 10000 UNIT/ML IJ SOLN
30000.0000 [IU] | INTRAMUSCULAR | Status: DC
  Administered 2014-10-17: 10000 [IU] via SUBCUTANEOUS

## 2014-10-17 MED ORDER — EPOETIN ALFA 20000 UNIT/ML IJ SOLN
INTRAMUSCULAR | Status: AC
  Administered 2014-10-17: 20000 [IU]
  Filled 2014-10-17: qty 1

## 2014-10-17 MED ORDER — EPOETIN ALFA 10000 UNIT/ML IJ SOLN
INTRAMUSCULAR | Status: AC
  Administered 2014-10-17: 10000 [IU] via SUBCUTANEOUS
  Filled 2014-10-17: qty 1

## 2014-11-18 ENCOUNTER — Emergency Department (HOSPITAL_COMMUNITY)
Admission: EM | Admit: 2014-11-18 | Discharge: 2014-11-19 | Disposition: A | Discharge status: Home / Self Care | Payer: Medicare Other | Attending: Emergency Medicine | Admitting: Emergency Medicine

## 2014-11-18 ENCOUNTER — Emergency Department (HOSPITAL_COMMUNITY): Payer: Medicare Other

## 2014-11-18 ENCOUNTER — Encounter (HOSPITAL_COMMUNITY): Payer: Self-pay | Admitting: *Deleted

## 2014-11-18 DIAGNOSIS — M795 Residual foreign body in soft tissue: Secondary | ICD-10-CM

## 2014-11-18 DIAGNOSIS — I129 Hypertensive chronic kidney disease with stage 1 through stage 4 chronic kidney disease, or unspecified chronic kidney disease: Secondary | ICD-10-CM | POA: Diagnosis not present

## 2014-11-18 DIAGNOSIS — N179 Acute kidney failure, unspecified: Secondary | ICD-10-CM | POA: Insufficient documentation

## 2014-11-18 DIAGNOSIS — Z792 Long term (current) use of antibiotics: Secondary | ICD-10-CM | POA: Insufficient documentation

## 2014-11-18 DIAGNOSIS — R0989 Other specified symptoms and signs involving the circulatory and respiratory systems: Secondary | ICD-10-CM | POA: Insufficient documentation

## 2014-11-18 DIAGNOSIS — Z8673 Personal history of transient ischemic attack (TIA), and cerebral infarction without residual deficits: Secondary | ICD-10-CM | POA: Diagnosis not present

## 2014-11-18 DIAGNOSIS — N189 Chronic kidney disease, unspecified: Secondary | ICD-10-CM

## 2014-11-18 DIAGNOSIS — Z7982 Long term (current) use of aspirin: Secondary | ICD-10-CM | POA: Diagnosis not present

## 2014-11-18 DIAGNOSIS — Z880 Allergy status to penicillin: Secondary | ICD-10-CM | POA: Diagnosis not present

## 2014-11-18 DIAGNOSIS — E785 Hyperlipidemia, unspecified: Secondary | ICD-10-CM | POA: Insufficient documentation

## 2014-11-18 DIAGNOSIS — Z79899 Other long term (current) drug therapy: Secondary | ICD-10-CM | POA: Diagnosis not present

## 2014-11-18 DIAGNOSIS — Z794 Long term (current) use of insulin: Secondary | ICD-10-CM | POA: Diagnosis not present

## 2014-11-18 DIAGNOSIS — E119 Type 2 diabetes mellitus without complications: Secondary | ICD-10-CM | POA: Insufficient documentation

## 2014-11-18 DIAGNOSIS — N183 Chronic kidney disease, stage 3 (moderate): Secondary | ICD-10-CM | POA: Insufficient documentation

## 2014-11-18 DIAGNOSIS — I160 Hypertensive urgency: Secondary | ICD-10-CM

## 2014-11-18 LAB — I-STAT CHEM 8, ED
BUN: 38 mg/dL — ABNORMAL HIGH (ref 6–23)
Calcium, Ion: 1.47 mmol/L — ABNORMAL HIGH (ref 1.13–1.30)
Chloride: 103 mmol/L (ref 96–112)
Creatinine, Ser: 3.2 mg/dL — ABNORMAL HIGH (ref 0.50–1.10)
Glucose, Bld: 209 mg/dL — ABNORMAL HIGH (ref 70–99)
HCT: 34 % — ABNORMAL LOW (ref 36.0–46.0)
Hemoglobin: 11.6 g/dL — ABNORMAL LOW (ref 12.0–15.0)
Potassium: 3.7 mmol/L (ref 3.5–5.1)
Sodium: 142 mmol/L (ref 135–145)
TCO2: 26 mmol/L (ref 0–100)

## 2014-11-18 MED ORDER — DEXAMETHASONE SODIUM PHOSPHATE 10 MG/ML IJ SOLN
10.0000 mg | Freq: Once | INTRAMUSCULAR | Status: AC
  Administered 2014-11-19: 10 mg via INTRAMUSCULAR
  Filled 2014-11-18: qty 1

## 2014-11-18 MED ORDER — PREDNISONE 20 MG PO TABS: 40.0000 mg | ORAL_TABLET | Freq: Every day | ORAL | Status: AC

## 2014-11-18 MED ORDER — NITROGLYCERIN 0.4 MG SL SUBL: 0.4000 mg | SUBLINGUAL_TABLET | SUBLINGUAL | Status: DC | PRN

## 2014-11-18 MED ORDER — SODIUM CHLORIDE 0.9 % IV BOLUS (SEPSIS)
500.0000 mL | Freq: Once | INTRAVENOUS | Status: AC
  Administered 2014-11-18: 500 mL via INTRAVENOUS

## 2014-11-18 MED ORDER — STERILE WATER FOR INJECTION IJ SOLN
INTRAMUSCULAR | Status: AC
  Administered 2014-11-18: 10 mL
  Filled 2014-11-18: qty 10

## 2014-11-18 MED ORDER — SODIUM CHLORIDE 0.9 % IV BOLUS (SEPSIS): 2500.0000 mL | Freq: Once | INTRAVENOUS | Status: DC

## 2014-11-18 MED ORDER — GLUCAGON HCL RDNA (DIAGNOSTIC) 1 MG IJ SOLR
1.0000 mg | Freq: Once | INTRAMUSCULAR | Status: AC
  Administered 2014-11-18: 1 mg via INTRAVENOUS
  Filled 2014-11-18: qty 1

## 2014-11-18 MED ORDER — LORAZEPAM 2 MG/ML IJ SOLN
1.0000 mg | Freq: Once | INTRAMUSCULAR | Status: AC
  Administered 2014-11-18: 1 mg via INTRAVENOUS
  Filled 2014-11-18: qty 1

## 2014-11-18 NOTE — Discharge Instructions (Signed)
As discussed, it is very important that you follow-up with both your primary care physician and our gastroenterologist.  Please call tomorrow to ensure appropriate ongoing care and management of your condition.  Return here for concerning changes in your condition.

## 2014-11-18 NOTE — ED Notes (Signed)
Patient returned from CT

## 2014-11-18 NOTE — ED Notes (Signed)
Pt in stating she thinks she has something stuck in her throat, she choked on something on Sunday and all day was unable to swallow anything, Monday she could swallow again, this evening she began to not be able to swallow again, unable to tolerate saliva, no distress noted

## 2014-11-18 NOTE — ED Notes (Signed)
MD at the bedside  

## 2014-11-18 NOTE — ED Notes (Signed)
Phlebotomy at the bedside  

## 2014-11-18 NOTE — ED Notes (Signed)
Patient returned from X-ray 

## 2014-11-18 NOTE — ED Provider Notes (Signed)
CSN: [id number]     Arrival date & time 11/18/14  1911 History   First MD Initiated Contact with Patient 11/18/14 2011     Chief Complaint  Patient presents with  . Foreign Body     (Consider location/radiation/quality/duration/timing/severity/associated sxs/prior Treatment) HPI Patient presents with concern of foreign body sensation in her throat, inability to tolerate her own secretions. Symptoms began 60 hours ago when the patient was eating breakfast, consisting of rice, ham. After developing foreign body sensation she has had intermittent exacerbations for the interval time. Currently the patient is intolerant of saliva. She is also intolerant of any medication, including antihypertensives. She denies other complaint, include chest pain, dyspnea, lightheadedness, syncope or nausea. No similar prior events, no history of esophageal pathology.  Past Medical History  Diagnosis Date  . Diabetes mellitus without complication   . Hyperlipidemia   . Hypertension   . CKD (chronic kidney disease) stage 3, GFR 30-59 ml/min   . TIA (transient ischemic attack) 2015   Past Surgical History  Procedure Laterality Date  . Abdominal hysterectomy  1974   Family History  Problem Relation Age of Onset  . Hypertension Other   . Hyperlipidemia Other   . Stroke Other   . Diabetes type II Mother   . Hypertension Mother   . Diabetes type II Father    History  Substance Use Topics  . Smoking status: Never Smoker   . Smokeless tobacco: Never Used  . Alcohol Use: No   OB History    No data available     Review of Systems  Constitutional:       Per HPI, otherwise negative  HENT:       Per HPI, otherwise negative  Respiratory:       Per HPI, otherwise negative  Cardiovascular:       Per HPI, otherwise negative  Gastrointestinal: Negative for vomiting.  Endocrine:       Negative aside from HPI  Genitourinary:       Neg aside from HPI   Musculoskeletal:       Per HPI, otherwise  negative  Skin: Negative.   Neurological: Negative for syncope.      Allergies  Penicillins  Home Medications   Prior to Admission medications   Medication Sig Start Date End Date Taking? Authorizing Provider  acetaminophen (TYLENOL) 500 MG tablet Take 1,000 mg by mouth every 8 (eight) hours as needed for mild pain.   Yes Historical Provider, MD  amLODipine (NORVASC) 10 MG tablet Take 10 mg by mouth daily.   Yes Historical Provider, MD  aspirin 325 MG tablet Take 325 mg by mouth daily.   Yes Historical Provider, MD  atorvastatin (LIPITOR) 20 MG tablet Take 20 mg by mouth daily at 6 PM.  06/03/14  Yes Historical Provider, MD  calcitRIOL (ROCALTROL) 0.25 MCG capsule Take 0.25 mcg by mouth 2 (two) times daily.  06/26/12  Yes Historical Provider, MD  cholecalciferol (VITAMIN D) 1000 UNITS tablet Take 1,000 Units by mouth daily.   Yes Historical Provider, MD  cloNIDine (CATAPRES) 0.1 MG tablet Take 0.1 mg by mouth 3 (three) times daily.  05/30/14  Yes Historical Provider, MD  furosemide (LASIX) 40 MG tablet Take 40 mg by mouth 2 (two) times daily.   Yes Historical Provider, MD  hydrALAZINE (APRESOLINE) 25 MG tablet Take 25 mg by mouth 2 (two) times daily. 05/30/14  Yes Historical Provider, MD  insulin detemir (LEVEMIR) 100 UNIT/ML injection Inject 36-38 Units into the  skin 2 (two) times daily. 356 units in am 38 units in pm   Yes Historical Provider, MD  loratadine (CLARITIN) 10 MG tablet Take 10 mg by mouth daily. 05/19/14  Yes Historical Provider, MD  metoprolol (LOPRESSOR) 100 MG tablet Take 100 mg by mouth 2 (two) times daily. 05/08/14  Yes Historical Provider, MD  olmesartan (BENICAR) 40 MG tablet Take 40 mg by mouth daily.   Yes Historical Provider, MD  cephALEXin (KEFLEX) 500 MG capsule Take 1 capsule (500 mg total) by mouth 4 (four) times daily. 03/08/14   Daleen Bo, MD   BP 168/85 mmHg  Pulse 92  Temp(Src) 98 F (36.7 C) (Oral)  Resp 13  SpO2 98% Physical Exam   Constitutional: She is oriented to person, place, and time. She appears well-developed and well-nourished. No distress.  HENT:  Head: Normocephalic and atraumatic.  Dentures. No gross posterior oropharyngeal lesions or FB  Eyes: Conjunctivae and EOM are normal.  Neck:  No asym No mass Mild ttp  Cardiovascular: Normal rate and regular rhythm.   Pulmonary/Chest: Effort normal and breath sounds normal. No stridor. No respiratory distress.  Abdominal: She exhibits no distension.  Musculoskeletal: She exhibits no edema.  Neurological: She is alert and oriented to person, place, and time. No cranial nerve deficit. Coordination normal.  Skin: Skin is warm and dry.  Psychiatric: She has a normal mood and affect.  Nursing note and vitals reviewed.   ED Course  Procedures (including critical care time) Labs Review Labs Reviewed  I-STAT CHEM 8, ED - Abnormal; Notable for the following:    BUN 38 (*)    Creatinine, Ser 3.20 (*)    Glucose, Bld 209 (*)    Calcium, Ion 1.47 (*)    Hemoglobin 11.6 (*)    HCT 34.0 (*)    All other components within normal limits    Imaging Review Dg Neck Soft Tissue  11/18/2014   CLINICAL DATA:  Choked on something Sunday.  Foreign body sensation.  EXAM: NECK SOFT TISSUES - 1+ VIEW  COMPARISON:  None.  FINDINGS: When accounting for carotid atherosclerotic calcification, there is no evidence for foreign body. Unexpected supraglottic density, but not definitive for a foreign body. There is unusual deviation of the pharynx to the left, not related to head turn. The epiglottis is opposed to the tongue base but otherwise normal. No prevertebral thickening.  IMPRESSION: 1. No foreign body detected. 2. Unexpected deviation of the pharynx to the left. Given symptoms, would further evaluate with CT.   Electronically Signed   By: Monte Fantasia M.D.   On: 11/18/2014 21:35     On repeat exam after the x-ray was performed the patient remains calm, signs remain  similar.   Ct Soft Tissue Neck Wo Contrast  11/18/2014   CLINICAL DATA:  Foreign body in soft tissues of the neck. Patient ate ham on Sunday, sensation of something stuck in throat since that time.  EXAM: CT NECK WITHOUT CONTRAST  TECHNIQUE: Multidetector CT imaging of the neck was performed following the standard protocol without intravenous contrast.  COMPARISON:  Radiographs earlier this day  FINDINGS: There is effacement of the left piriform sinus, however the space related to mass effect from the medial course the carotid artery. No soft tissue mass or findings of foreign body. Cervical esophagus is normal. There is no airway obstruction, cervical airway is patent. There is atherosclerosis of the carotid arteries. Left thyroid nodule measures 2.1 cm. No cervical adenopathy. Patient is edentulous.  Epiglottis is normal. No acute osseous abnormalities.  IMPRESSION: 1. No foreign body to suggest retained food bolus. 2. Effacement of the left piriform sinus accounting for the appearance on radiograph appears related to mass effect from medially deviated carotid artery. No underlying mass lesion. 3. Left thyroid nodule measuring 2.1 cm. Consider further evaluation with nonemergent thyroid ultrasound. If patient is clinically hyperthyroid, consider nuclear medicine thyroid uptake and scan.   Electronically Signed   By: Jeb Levering M.D.   On: 11/18/2014 23:41    o2- 99%ra, nml  Repeat exam after reviewing the CT imaging myself, the patient is now intolerant of secretions, liquids. I discussed all findings at length with her and her family members. Some concern for possible soft tissue swelling, patient will receive short course of steroids, follow up with GI for consideration of endoscopy. We discussed return precautions, follow-up instructions at length.  MDM   Final diagnoses:  Foreign body (FB) in soft tissue   foreign body sensation in throat  This patient presents with several days of  intermittent difficulty swallowing. Here the patient is awake, alert, initially intolerant of secretions, but after hours of monitoring, fluids, patient is able to handle her secretions. Patient's CT scan does not demonstrate retained foreign body, and she is tolerant of liquids, and appropriate for further evaluation, management as an outpatient. Patient is also aware of evidence for chronic kidney disease. She'll discuss both that and CT evidence for thyroid dysfunction with her primary care physician.     Carmin Muskrat, MD 11/18/14 2352

## 2014-11-19 NOTE — ED Notes (Signed)
Pt made aware to return if symptoms worsen or if any life threatening symptoms occur.   

## 2014-11-22 ENCOUNTER — Emergency Department (HOSPITAL_COMMUNITY)
Admission: EM | Admit: 2014-11-22 | Discharge: 2014-11-22 | Disposition: A | Discharge status: Home / Self Care | Payer: Medicare Other | Attending: Emergency Medicine | Admitting: Emergency Medicine

## 2014-11-22 ENCOUNTER — Encounter (HOSPITAL_COMMUNITY): Payer: Self-pay | Admitting: Emergency Medicine

## 2014-11-22 DIAGNOSIS — Z79899 Other long term (current) drug therapy: Secondary | ICD-10-CM | POA: Diagnosis not present

## 2014-11-22 DIAGNOSIS — Z7952 Long term (current) use of systemic steroids: Secondary | ICD-10-CM | POA: Insufficient documentation

## 2014-11-22 DIAGNOSIS — E785 Hyperlipidemia, unspecified: Secondary | ICD-10-CM | POA: Insufficient documentation

## 2014-11-22 DIAGNOSIS — Z8673 Personal history of transient ischemic attack (TIA), and cerebral infarction without residual deficits: Secondary | ICD-10-CM | POA: Insufficient documentation

## 2014-11-22 DIAGNOSIS — E119 Type 2 diabetes mellitus without complications: Secondary | ICD-10-CM | POA: Diagnosis not present

## 2014-11-22 DIAGNOSIS — Z794 Long term (current) use of insulin: Secondary | ICD-10-CM | POA: Insufficient documentation

## 2014-11-22 DIAGNOSIS — Z880 Allergy status to penicillin: Secondary | ICD-10-CM | POA: Diagnosis not present

## 2014-11-22 DIAGNOSIS — Z972 Presence of dental prosthetic device (complete) (partial): Secondary | ICD-10-CM | POA: Insufficient documentation

## 2014-11-22 DIAGNOSIS — N183 Chronic kidney disease, stage 3 (moderate): Secondary | ICD-10-CM | POA: Diagnosis not present

## 2014-11-22 DIAGNOSIS — I129 Hypertensive chronic kidney disease with stage 1 through stage 4 chronic kidney disease, or unspecified chronic kidney disease: Secondary | ICD-10-CM | POA: Diagnosis not present

## 2014-11-22 DIAGNOSIS — Z7982 Long term (current) use of aspirin: Secondary | ICD-10-CM | POA: Insufficient documentation

## 2014-11-22 DIAGNOSIS — R1310 Dysphagia, unspecified: Secondary | ICD-10-CM | POA: Diagnosis not present

## 2014-11-22 NOTE — Discharge Instructions (Signed)

## 2014-11-22 NOTE — ED Provider Notes (Signed)
CSN: [id number]     Arrival date & time 11/22/14  1604 History   First MD Initiated Contact with Patient 11/22/14 1704     Chief Complaint  Patient presents with  . Dysphagia     (Consider location/radiation/quality/duration/timing/severity/associated sxs/prior Treatment) HPI [name] [name] is a 79 y.o. female who comes in for evaluation of difficulty swallowing. This is been an intermittent problem for the patient since Tuesday. She was seen and evaluated on 412 for similar symptoms, given referral to GI and instructions to follow-up PCP. Patient states that she did go to her PCP on Wednesday but is unsure if they scheduled an appointment with GI or not. She reports this morning she ate her breakfast without difficulty, but when she started taking her medications. Began to have an increasing difficulty swallowing her pills, "I felt like they just kept getting backed up". She reports this sensation lasted for a few minutes, but has since resolved-- wanted to come to the ED for re-evaluation. She denies sore throat, abdominal pain, chest pain, any difficulties breathing, cough. No other aggravating or modifying factors.  Past Medical History  Diagnosis Date  . Diabetes mellitus without complication   . Hyperlipidemia   . Hypertension   . CKD (chronic kidney disease) stage 3, GFR 30-59 ml/min   . TIA (transient ischemic attack) 2015   Past Surgical History  Procedure Laterality Date  . Abdominal hysterectomy  1974   Family History  Problem Relation Age of Onset  . Hypertension Other   . Hyperlipidemia Other   . Stroke Other   . Diabetes type II Mother   . Hypertension Mother   . Diabetes type II Father    History  Substance Use Topics  . Smoking status: Never Smoker   . Smokeless tobacco: Never Used  . Alcohol Use: No   OB History    No data available     Review of Systems A 10 point review of systems was completed and was negative except for pertinent positives and  negatives as mentioned in the history of present illness     Allergies  Penicillins  Home Medications   Prior to Admission medications   Medication Sig Start Date End Date Taking? Authorizing Provider  acetaminophen (TYLENOL) 500 MG tablet Take 1,000 mg by mouth every 8 (eight) hours as needed for mild pain.    Historical Provider, MD  amLODipine (NORVASC) 10 MG tablet Take 10 mg by mouth daily.    Historical Provider, MD  aspirin 325 MG tablet Take 325 mg by mouth daily.    Historical Provider, MD  atorvastatin (LIPITOR) 20 MG tablet Take 20 mg by mouth daily at 6 PM.  06/03/14   Historical Provider, MD  calcitRIOL (ROCALTROL) 0.25 MCG capsule Take 0.25 mcg by mouth 2 (two) times daily.  06/26/12   Historical Provider, MD  cephALEXin (KEFLEX) 500 MG capsule Take 1 capsule (500 mg total) by mouth 4 (four) times daily. 03/08/14   Daleen Bo, MD  cholecalciferol (VITAMIN D) 1000 UNITS tablet Take 1,000 Units by mouth daily.    Historical Provider, MD  cloNIDine (CATAPRES) 0.1 MG tablet Take 0.1 mg by mouth 3 (three) times daily.  05/30/14   Historical Provider, MD  furosemide (LASIX) 40 MG tablet Take 40 mg by mouth 2 (two) times daily.    Historical Provider, MD  hydrALAZINE (APRESOLINE) 25 MG tablet Take 25 mg by mouth 2 (two) times daily. 05/30/14   Historical Provider, MD  insulin detemir (LEVEMIR)  100 UNIT/ML injection Inject 36-38 Units into the skin 2 (two) times daily. 356 units in am 38 units in pm    Historical Provider, MD  loratadine (CLARITIN) 10 MG tablet Take 10 mg by mouth daily. 05/19/14   Historical Provider, MD  metoprolol (LOPRESSOR) 100 MG tablet Take 100 mg by mouth 2 (two) times daily. 05/08/14   Historical Provider, MD  olmesartan (BENICAR) 40 MG tablet Take 40 mg by mouth daily.    Historical Provider, MD  predniSONE (DELTASONE) 20 MG tablet Take 2 tablets (40 mg total) by mouth daily. 11/19/14 11/22/14  Carmin Muskrat, MD   BP 204/94 mmHg  Pulse 110  Temp(Src) 98  F (36.7 C) (Oral)  Resp 16  Ht 5\' 2"  (1.575 m)  Wt 191 lb 8 oz (86.864 kg)  BMI 35.02 kg/m2  SpO2 98% Physical Exam  Constitutional: She is oriented to person, place, and time. She appears well-developed and well-nourished.  HENT:  Head: Normocephalic and atraumatic.  Mouth/Throat: Oropharynx is clear and moist.  Wears dentures. Mallampati 3. Oropharynx is otherwise clear and moist with no evidence of foreign body. Patent airway, tolerating secretions well. Speaking in full sentences without any discomfort or difficulty.  Eyes: Conjunctivae are normal. Pupils are equal, round, and reactive to light. Right eye exhibits no discharge. Left eye exhibits no discharge. No scleral icterus.  Neck: Normal range of motion. Neck supple. No JVD present. No tracheal deviation present.  Cardiovascular: Normal rate, regular rhythm and normal heart sounds.   Pulmonary/Chest: Effort normal and breath sounds normal. No stridor. No respiratory distress. She has no wheezes. She has no rales.  Abdominal: Soft. There is no tenderness.  Musculoskeletal: She exhibits no tenderness.  Lymphadenopathy:    She has no cervical adenopathy.  Neurological: She is alert and oriented to person, place, and time.  Cranial Nerves II-XII grossly intact  Skin: Skin is warm and dry. No rash noted.  Psychiatric: She has a normal mood and affect.  Nursing note and vitals reviewed.   ED Course  Procedures (including critical care time) Labs Review Labs Reviewed - No data to display  Imaging Review No results found.   EKG Interpretation None      MDM  Vitals stable  -afebrile Pt resting comfortably in ED. PE--oropharynx is clear and moist, with no evidence of obstruction. Patient is tolerating PO in the ED without difficulty. Speaking in full sentences without difficulty. Patent airway with normal lung exam. 7:10 PM patient takes all of her daily medications here in the ED, orally without difficulty.  No  significant or emergent change from prior evaluation.  Will provide patient with GI referral and instructed patient to follow-up with them and/or PCP to ensure definitive care of dysphagia.  I discussed all relevant lab findings and imaging results with pt and they verbalized understanding. Discussed f/u with PCP within 48 hrs and return precautions, pt very amenable to plan. Prior to patient discharge, I discussed and reviewed this case with Dr. Ralene Bathe, who also saw and evaluated patient.   Final diagnoses:  Dysphagia        Comer Locket, PA-C 11/22/14 1937  Quintella Reichert, MD 11/23/14 [phone]

## 2014-11-22 NOTE — ED Notes (Signed)
Pt. Taking night time medications. [place], [place] notified. Michela Pitcher that is okay. Will take one at a time.

## 2014-11-22 NOTE — ED Notes (Signed)
Pt. Tolerating ginger ale and crackers at this time.

## 2014-11-22 NOTE — ED Notes (Addendum)
Pt reports difficulty swallowing ongoing since Tuesday.  Pt seen here at that time and given medication that helped.  Pt able to eat food without difficulty, pt just has problems with swallowing her larger pills. Pt able to talk in complete sentences.

## 2014-11-22 NOTE — ED Notes (Signed)
Pt. Able to take all of evening medication with no problems

## 2014-11-28 ENCOUNTER — Encounter (HOSPITAL_COMMUNITY)
Admission: RE | Admit: 2014-11-28 | Discharge: 2014-11-28 | Disposition: A | Discharge status: Home / Self Care | Payer: Medicare Other | Source: Ambulatory Visit | Attending: Nephrology | Admitting: Nephrology

## 2014-11-28 DIAGNOSIS — D638 Anemia in other chronic diseases classified elsewhere: Secondary | ICD-10-CM | POA: Insufficient documentation

## 2014-11-28 DIAGNOSIS — N183 Chronic kidney disease, stage 3 (moderate): Secondary | ICD-10-CM | POA: Diagnosis not present

## 2014-11-28 LAB — RENAL FUNCTION PANEL
Albumin: 3.1 g/dL — ABNORMAL LOW (ref 3.5–5.2)
Anion gap: 10 (ref 5–15)
BUN: 43 mg/dL — ABNORMAL HIGH (ref 6–23)
CO2: 32 mmol/L (ref 19–32)
Calcium: 10.1 mg/dL (ref 8.4–10.5)
Chloride: 96 mmol/L (ref 96–112)
Creatinine, Ser: 3.14 mg/dL — ABNORMAL HIGH (ref 0.50–1.10)
GFR calc Af Amer: 15 mL/min — ABNORMAL LOW (ref >90)
GFR calc non Af Amer: 13 mL/min — ABNORMAL LOW (ref >90)
Glucose, Bld: 203 mg/dL — ABNORMAL HIGH (ref 70–99)
Phosphorus: 5.1 mg/dL — ABNORMAL HIGH (ref 2.3–4.6)
Potassium: 4.1 mmol/L (ref 3.5–5.1)
Sodium: 138 mmol/L (ref 135–145)

## 2014-11-28 LAB — POCT HEMOGLOBIN-HEMACUE: Hemoglobin: 9.6 g/dL — ABNORMAL LOW (ref 12.0–15.0)

## 2014-11-28 LAB — IRON AND TIBC
Iron: 40 ug/dL — ABNORMAL LOW (ref 42–145)
Saturation Ratios: 19 % — ABNORMAL LOW (ref 20–55)
TIBC: 212 ug/dL — ABNORMAL LOW (ref 250–470)
UIBC: 172 ug/dL (ref 125–400)

## 2014-11-28 MED ORDER — SODIUM CHLORIDE 0.9 % IV SOLN
510.0000 mg | INTRAVENOUS | Status: DC
  Administered 2014-11-28: 510 mg via INTRAVENOUS
  Filled 2014-11-28: qty 17

## 2014-11-28 MED ORDER — EPOETIN ALFA 10000 UNIT/ML IJ SOLN
30000.0000 [IU] | INTRAMUSCULAR | Status: DC
  Administered 2014-11-28: 10000 [IU] via SUBCUTANEOUS

## 2014-11-28 MED ORDER — EPOETIN ALFA 10000 UNIT/ML IJ SOLN
INTRAMUSCULAR | Status: AC
  Administered 2014-11-28: 10000 [IU] via SUBCUTANEOUS
  Filled 2014-11-28: qty 1

## 2014-11-28 MED ORDER — EPOETIN ALFA 20000 UNIT/ML IJ SOLN
INTRAMUSCULAR | Status: AC
  Administered 2014-11-28: 20000 [IU]
  Filled 2014-11-28: qty 1

## 2014-11-29 LAB — FERRITIN: Ferritin: 668 ng/mL — ABNORMAL HIGH (ref 10–291)

## 2014-11-29 LAB — PTH, INTACT AND CALCIUM
Calcium, Total (PTH): 9.9 mg/dL (ref 8.7–10.3)
PTH: 18 pg/mL (ref 15–65)

## 2014-12-04 ENCOUNTER — Encounter (HOSPITAL_COMMUNITY)
Admission: RE | Admit: 2014-12-04 | Discharge: 2014-12-04 | Disposition: A | Discharge status: Home / Self Care | Payer: Medicare Other | Source: Ambulatory Visit | Attending: Nephrology | Admitting: Nephrology

## 2014-12-04 DIAGNOSIS — D638 Anemia in other chronic diseases classified elsewhere: Secondary | ICD-10-CM | POA: Diagnosis not present

## 2014-12-04 MED ORDER — SODIUM CHLORIDE 0.9 % IV SOLN
510.0000 mg | INTRAVENOUS | Status: AC
  Administered 2014-12-04: 510 mg via INTRAVENOUS
  Filled 2014-12-04: qty 17

## 2015-01-07 ENCOUNTER — Encounter (HOSPITAL_COMMUNITY)
Admission: RE | Admit: 2015-01-07 | Discharge: 2015-01-07 | Disposition: A | Discharge status: Home / Self Care | Payer: Medicare Other | Source: Ambulatory Visit | Attending: Nephrology | Admitting: Nephrology

## 2015-01-07 DIAGNOSIS — D638 Anemia in other chronic diseases classified elsewhere: Secondary | ICD-10-CM | POA: Insufficient documentation

## 2015-01-07 DIAGNOSIS — N183 Chronic kidney disease, stage 3 (moderate): Secondary | ICD-10-CM | POA: Insufficient documentation

## 2015-01-07 LAB — POCT HEMOGLOBIN-HEMACUE: Hemoglobin: 9.3 g/dL — ABNORMAL LOW (ref 12.0–15.0)

## 2015-01-07 MED ORDER — EPOETIN ALFA 20000 UNIT/ML IJ SOLN
INTRAMUSCULAR | Status: AC
  Administered 2015-01-07: 20000 [IU]
  Filled 2015-01-07: qty 1

## 2015-01-07 MED ORDER — EPOETIN ALFA 10000 UNIT/ML IJ SOLN
30000.0000 [IU] | INTRAMUSCULAR | Status: DC
  Administered 2015-01-07: 10000 [IU] via SUBCUTANEOUS

## 2015-01-07 MED ORDER — EPOETIN ALFA 10000 UNIT/ML IJ SOLN
INTRAMUSCULAR | Status: AC
  Administered 2015-01-07: 10000 [IU] via SUBCUTANEOUS
  Filled 2015-01-07: qty 1

## 2015-01-09 ENCOUNTER — Encounter (HOSPITAL_COMMUNITY): Payer: Medicare Other

## 2015-02-17 ENCOUNTER — Other Ambulatory Visit (HOSPITAL_COMMUNITY): Payer: Self-pay | Admitting: *Deleted

## 2015-02-18 ENCOUNTER — Inpatient Hospital Stay (HOSPITAL_COMMUNITY): Admission: RE | Admit: 2015-02-18 | Payer: Medicare Other | Source: Ambulatory Visit

## 2015-02-24 ENCOUNTER — Encounter (HOSPITAL_COMMUNITY)
Admission: RE | Admit: 2015-02-24 | Discharge: 2015-02-24 | Disposition: A | Discharge status: Home / Self Care | Payer: Medicare Other | Source: Ambulatory Visit | Attending: Nephrology | Admitting: Nephrology

## 2015-02-24 DIAGNOSIS — D638 Anemia in other chronic diseases classified elsewhere: Secondary | ICD-10-CM | POA: Insufficient documentation

## 2015-02-24 DIAGNOSIS — N183 Chronic kidney disease, stage 3 (moderate): Secondary | ICD-10-CM | POA: Insufficient documentation

## 2015-02-24 LAB — RENAL FUNCTION PANEL
Albumin: 3.5 g/dL (ref 3.5–5.0)
Anion gap: 7 (ref 5–15)
BUN: 29 mg/dL — ABNORMAL HIGH (ref 6–20)
CO2: 28 mmol/L (ref 22–32)
Calcium: 8.9 mg/dL (ref 8.9–10.3)
Chloride: 104 mmol/L (ref 101–111)
Creatinine, Ser: 2.51 mg/dL — ABNORMAL HIGH (ref 0.44–1.00)
GFR calc Af Amer: 20 mL/min — ABNORMAL LOW (ref >60)
GFR calc non Af Amer: 17 mL/min — ABNORMAL LOW (ref >60)
Glucose, Bld: 153 mg/dL — ABNORMAL HIGH (ref 65–99)
Phosphorus: 3.4 mg/dL (ref 2.5–4.6)
Potassium: 3.8 mmol/L (ref 3.5–5.1)
Sodium: 139 mmol/L (ref 135–145)

## 2015-02-24 LAB — IRON AND TIBC
Iron: 57 ug/dL (ref 28–170)
Saturation Ratios: 26 % (ref 10.4–31.8)
TIBC: 220 ug/dL — ABNORMAL LOW (ref 250–450)
UIBC: 163 ug/dL

## 2015-02-24 LAB — FERRITIN: Ferritin: 562 ng/mL — ABNORMAL HIGH (ref 11–307)

## 2015-02-24 LAB — POCT HEMOGLOBIN-HEMACUE: Hemoglobin: 10.4 g/dL — ABNORMAL LOW (ref 12.0–15.0)

## 2015-02-24 MED ORDER — EPOETIN ALFA 20000 UNIT/ML IJ SOLN
INTRAMUSCULAR | Status: AC
  Administered 2015-02-24: 20000 [IU] via SUBCUTANEOUS
  Filled 2015-02-24: qty 1

## 2015-02-24 MED ORDER — EPOETIN ALFA 10000 UNIT/ML IJ SOLN
INTRAMUSCULAR | Status: AC
  Administered 2015-02-24: 10000 [IU] via SUBCUTANEOUS
  Filled 2015-02-24: qty 1

## 2015-02-24 MED ORDER — EPOETIN ALFA 10000 UNIT/ML IJ SOLN: 30000.0000 [IU] | INTRAMUSCULAR | Status: DC

## 2015-02-25 LAB — PTH, INTACT AND CALCIUM
Calcium, Total (PTH): 8.9 mg/dL (ref 8.7–10.3)
PTH: 166 pg/mL — ABNORMAL HIGH (ref 15–65)

## 2015-04-06 ENCOUNTER — Other Ambulatory Visit (HOSPITAL_COMMUNITY): Payer: Self-pay | Admitting: *Deleted

## 2015-04-07 ENCOUNTER — Encounter (HOSPITAL_COMMUNITY)
Admission: RE | Admit: 2015-04-07 | Discharge: 2015-04-07 | Disposition: A | Discharge status: Home / Self Care | Payer: Medicare Other | Source: Ambulatory Visit | Attending: Nephrology | Admitting: Nephrology

## 2015-04-07 DIAGNOSIS — N183 Chronic kidney disease, stage 3 (moderate): Secondary | ICD-10-CM | POA: Diagnosis not present

## 2015-04-07 DIAGNOSIS — D638 Anemia in other chronic diseases classified elsewhere: Secondary | ICD-10-CM | POA: Diagnosis present

## 2015-04-07 LAB — POCT HEMOGLOBIN-HEMACUE: Hemoglobin: 10.1 g/dL — ABNORMAL LOW (ref 12.0–15.0)

## 2015-04-07 MED ORDER — EPOETIN ALFA 10000 UNIT/ML IJ SOLN
INTRAMUSCULAR | Status: AC
  Filled 2015-04-07: qty 1

## 2015-04-07 MED ORDER — EPOETIN ALFA 20000 UNIT/ML IJ SOLN
INTRAMUSCULAR | Status: AC
  Administered 2015-04-07: 20000 [IU] via SUBCUTANEOUS
  Filled 2015-04-07: qty 1

## 2015-04-07 MED ORDER — EPOETIN ALFA 10000 UNIT/ML IJ SOLN
30000.0000 [IU] | INTRAMUSCULAR | Status: DC
  Administered 2015-04-07: 10000 [IU] via SUBCUTANEOUS

## 2015-05-19 ENCOUNTER — Encounter (HOSPITAL_COMMUNITY)
Admission: RE | Admit: 2015-05-19 | Discharge: 2015-05-19 | Disposition: A | Discharge status: Home / Self Care | Payer: Medicare Other | Source: Ambulatory Visit | Attending: Nephrology | Admitting: Nephrology

## 2015-05-19 DIAGNOSIS — N183 Chronic kidney disease, stage 3 (moderate): Secondary | ICD-10-CM | POA: Insufficient documentation

## 2015-05-19 DIAGNOSIS — D638 Anemia in other chronic diseases classified elsewhere: Secondary | ICD-10-CM | POA: Insufficient documentation

## 2015-05-19 LAB — FERRITIN: Ferritin: 525 ng/mL — ABNORMAL HIGH (ref 11–307)

## 2015-05-19 LAB — POCT HEMOGLOBIN-HEMACUE: Hemoglobin: 9.8 g/dL — ABNORMAL LOW (ref 12.0–15.0)

## 2015-05-19 LAB — RENAL FUNCTION PANEL
Albumin: 3.2 g/dL — ABNORMAL LOW (ref 3.5–5.0)
Anion gap: 9 (ref 5–15)
BUN: 24 mg/dL — ABNORMAL HIGH (ref 6–20)
CO2: 24 mmol/L (ref 22–32)
Calcium: 6.8 mg/dL — ABNORMAL LOW (ref 8.9–10.3)
Chloride: 102 mmol/L (ref 101–111)
Creatinine, Ser: 2.39 mg/dL — ABNORMAL HIGH (ref 0.44–1.00)
GFR calc Af Amer: 21 mL/min — ABNORMAL LOW (ref >60)
GFR calc non Af Amer: 18 mL/min — ABNORMAL LOW (ref >60)
Glucose, Bld: 99 mg/dL (ref 65–99)
Phosphorus: 2.5 mg/dL (ref 2.5–4.6)
Potassium: 4 mmol/L (ref 3.5–5.1)
Sodium: 135 mmol/L (ref 135–145)

## 2015-05-19 LAB — IRON AND TIBC
Iron: 41 ug/dL (ref 28–170)
Saturation Ratios: 18 % (ref 10.4–31.8)
TIBC: 225 ug/dL — ABNORMAL LOW (ref 250–450)
UIBC: 184 ug/dL

## 2015-05-19 MED ORDER — EPOETIN ALFA 10000 UNIT/ML IJ SOLN
INTRAMUSCULAR | Status: AC
  Administered 2015-05-19: 10000 [IU]
  Filled 2015-05-19: qty 1

## 2015-05-19 MED ORDER — EPOETIN ALFA 10000 UNIT/ML IJ SOLN: 30000.0000 [IU] | INTRAMUSCULAR | Status: DC

## 2015-05-19 MED ORDER — EPOETIN ALFA 20000 UNIT/ML IJ SOLN
INTRAMUSCULAR | Status: AC
  Administered 2015-05-19: 20000 [IU]
  Filled 2015-05-19: qty 1

## 2015-05-20 LAB — PTH, INTACT AND CALCIUM
Calcium, Total (PTH): 7 mg/dL — ABNORMAL LOW (ref 8.7–10.3)
PTH: 620 pg/mL — ABNORMAL HIGH (ref 15–65)

## 2015-05-25 ENCOUNTER — Emergency Department (HOSPITAL_COMMUNITY): Payer: Medicare Other

## 2015-05-25 ENCOUNTER — Encounter (HOSPITAL_COMMUNITY): Payer: Self-pay | Admitting: *Deleted

## 2015-05-25 ENCOUNTER — Inpatient Hospital Stay (HOSPITAL_COMMUNITY)
Admission: EM | Admit: 2015-05-25 | Discharge: 2015-06-03 | DRG: 291 | Disposition: A | Discharge status: Home Health | Payer: Medicare Other | Attending: Internal Medicine | Admitting: Internal Medicine

## 2015-05-25 DIAGNOSIS — R32 Unspecified urinary incontinence: Secondary | ICD-10-CM | POA: Diagnosis not present

## 2015-05-25 DIAGNOSIS — E559 Vitamin D deficiency, unspecified: Secondary | ICD-10-CM | POA: Diagnosis present

## 2015-05-25 DIAGNOSIS — E669 Obesity, unspecified: Secondary | ICD-10-CM | POA: Diagnosis present

## 2015-05-25 DIAGNOSIS — I509 Heart failure, unspecified: Secondary | ICD-10-CM

## 2015-05-25 DIAGNOSIS — Z6841 Body Mass Index (BMI) 40.0 and over, adult: Secondary | ICD-10-CM

## 2015-05-25 DIAGNOSIS — I5033 Acute on chronic diastolic (congestive) heart failure: Secondary | ICD-10-CM | POA: Diagnosis present

## 2015-05-25 DIAGNOSIS — E11649 Type 2 diabetes mellitus with hypoglycemia without coma: Secondary | ICD-10-CM | POA: Diagnosis present

## 2015-05-25 DIAGNOSIS — Z7982 Long term (current) use of aspirin: Secondary | ICD-10-CM | POA: Diagnosis not present

## 2015-05-25 DIAGNOSIS — I130 Hypertensive heart and chronic kidney disease with heart failure and stage 1 through stage 4 chronic kidney disease, or unspecified chronic kidney disease: Secondary | ICD-10-CM | POA: Diagnosis not present

## 2015-05-25 DIAGNOSIS — Z9119 Patient's noncompliance with other medical treatment and regimen: Secondary | ICD-10-CM

## 2015-05-25 DIAGNOSIS — R0602 Shortness of breath: Secondary | ICD-10-CM | POA: Diagnosis present

## 2015-05-25 DIAGNOSIS — Z8673 Personal history of transient ischemic attack (TIA), and cerebral infarction without residual deficits: Secondary | ICD-10-CM | POA: Diagnosis not present

## 2015-05-25 DIAGNOSIS — N179 Acute kidney failure, unspecified: Secondary | ICD-10-CM | POA: Diagnosis not present

## 2015-05-25 DIAGNOSIS — G4733 Obstructive sleep apnea (adult) (pediatric): Secondary | ICD-10-CM | POA: Diagnosis present

## 2015-05-25 DIAGNOSIS — R0600 Dyspnea, unspecified: Secondary | ICD-10-CM | POA: Diagnosis not present

## 2015-05-25 DIAGNOSIS — E785 Hyperlipidemia, unspecified: Secondary | ICD-10-CM | POA: Diagnosis present

## 2015-05-25 DIAGNOSIS — N2581 Secondary hyperparathyroidism of renal origin: Secondary | ICD-10-CM | POA: Diagnosis present

## 2015-05-25 DIAGNOSIS — N184 Chronic kidney disease, stage 4 (severe): Secondary | ICD-10-CM | POA: Diagnosis present

## 2015-05-25 DIAGNOSIS — R601 Generalized edema: Secondary | ICD-10-CM | POA: Diagnosis present

## 2015-05-25 DIAGNOSIS — D649 Anemia, unspecified: Secondary | ICD-10-CM | POA: Diagnosis present

## 2015-05-25 DIAGNOSIS — E1121 Type 2 diabetes mellitus with diabetic nephropathy: Secondary | ICD-10-CM | POA: Diagnosis present

## 2015-05-25 DIAGNOSIS — E1122 Type 2 diabetes mellitus with diabetic chronic kidney disease: Secondary | ICD-10-CM | POA: Diagnosis present

## 2015-05-25 DIAGNOSIS — D509 Iron deficiency anemia, unspecified: Secondary | ICD-10-CM | POA: Diagnosis present

## 2015-05-25 DIAGNOSIS — D631 Anemia in chronic kidney disease: Secondary | ICD-10-CM | POA: Diagnosis present

## 2015-05-25 DIAGNOSIS — E1129 Type 2 diabetes mellitus with other diabetic kidney complication: Secondary | ICD-10-CM | POA: Diagnosis present

## 2015-05-25 DIAGNOSIS — I10 Essential (primary) hypertension: Secondary | ICD-10-CM | POA: Diagnosis not present

## 2015-05-25 DIAGNOSIS — E876 Hypokalemia: Secondary | ICD-10-CM | POA: Diagnosis not present

## 2015-05-25 DIAGNOSIS — I5032 Chronic diastolic (congestive) heart failure: Secondary | ICD-10-CM

## 2015-05-25 HISTORY — DX: Unspecified osteoarthritis, unspecified site: M19.90

## 2015-05-25 HISTORY — DX: Heart failure, unspecified: I50.9

## 2015-05-25 HISTORY — DX: Reserved for inherently not codable concepts without codable children: IMO0001

## 2015-05-25 HISTORY — DX: Sleep apnea, unspecified: G47.30

## 2015-05-25 LAB — BASIC METABOLIC PANEL
Anion gap: 9 (ref 5–15)
BUN: 16 mg/dL (ref 6–20)
CO2: 20 mmol/L — ABNORMAL LOW (ref 22–32)
Calcium: 5.5 mg/dL — CL (ref 8.9–10.3)
Chloride: 105 mmol/L (ref 101–111)
Creatinine, Ser: 2.34 mg/dL — ABNORMAL HIGH (ref 0.44–1.00)
GFR calc Af Amer: 22 mL/min — ABNORMAL LOW (ref >60)
GFR calc non Af Amer: 19 mL/min — ABNORMAL LOW (ref >60)
Glucose, Bld: 233 mg/dL — ABNORMAL HIGH (ref 65–99)
Potassium: 4.6 mmol/L (ref 3.5–5.1)
Sodium: 134 mmol/L — ABNORMAL LOW (ref 135–145)

## 2015-05-25 LAB — GLUCOSE, CAPILLARY: Glucose-Capillary: 161 mg/dL — ABNORMAL HIGH (ref 65–99)

## 2015-05-25 LAB — URINALYSIS, ROUTINE W REFLEX MICROSCOPIC
Bilirubin Urine: NEGATIVE
Glucose, UA: NEGATIVE mg/dL
Hgb urine dipstick: NEGATIVE
Ketones, ur: NEGATIVE mg/dL
Leukocytes, UA: NEGATIVE
Nitrite: NEGATIVE
Protein, ur: 30 mg/dL — AB
Specific Gravity, Urine: 1.006 (ref 1.005–1.030)
Urobilinogen, UA: 0.2 mg/dL (ref 0.0–1.0)
pH: 7.5 (ref 5.0–8.0)

## 2015-05-25 LAB — CBC
HCT: 30.5 % — ABNORMAL LOW (ref 36.0–46.0)
Hemoglobin: 9.1 g/dL — ABNORMAL LOW (ref 12.0–15.0)
MCH: 22 pg — ABNORMAL LOW (ref 26.0–34.0)
MCHC: 29.8 g/dL — ABNORMAL LOW (ref 30.0–36.0)
MCV: 73.7 fL — ABNORMAL LOW (ref 78.0–100.0)
Platelets: 289 10*3/uL (ref 150–400)
RBC: 4.14 MIL/uL (ref 3.87–5.11)
RDW: 18.1 % — ABNORMAL HIGH (ref 11.5–15.5)
WBC: 8.9 10*3/uL (ref 4.0–10.5)

## 2015-05-25 LAB — HEPATIC FUNCTION PANEL
ALT: 12 U/L — ABNORMAL LOW (ref 14–54)
AST: 20 U/L (ref 15–41)
Albumin: 3.2 g/dL — ABNORMAL LOW (ref 3.5–5.0)
Alkaline Phosphatase: 113 U/L (ref 38–126)
Bilirubin, Direct: <0.1 mg/dL — ABNORMAL LOW (ref 0.1–0.5)
Total Bilirubin: 0.3 mg/dL (ref 0.3–1.2)
Total Protein: 7.7 g/dL (ref 6.5–8.1)

## 2015-05-25 LAB — I-STAT TROPONIN, ED: Troponin i, poc: 0 ng/mL (ref 0.00–0.08)

## 2015-05-25 LAB — URINE MICROSCOPIC-ADD ON

## 2015-05-25 LAB — PHOSPHORUS: Phosphorus: 1.9 mg/dL — ABNORMAL LOW (ref 2.5–4.6)

## 2015-05-25 LAB — BRAIN NATRIURETIC PEPTIDE: B Natriuretic Peptide: 195.2 pg/mL — ABNORMAL HIGH (ref 0.0–100.0)

## 2015-05-25 MED ORDER — ASPIRIN 325 MG PO TABS
325.0000 mg | ORAL_TABLET | Freq: Every day | ORAL | Status: DC
Start: 2015-05-26 — End: 2015-06-03
  Administered 2015-05-26 – 2015-06-03 (×9): 325 mg via ORAL
  Filled 2015-05-25 (×9): qty 1

## 2015-05-25 MED ORDER — ACETAMINOPHEN 650 MG RE SUPP: 650.0000 mg | Freq: Four times a day (QID) | RECTAL | Status: DC | PRN

## 2015-05-25 MED ORDER — ONDANSETRON HCL 4 MG PO TABS: 4.0000 mg | ORAL_TABLET | Freq: Four times a day (QID) | ORAL | Status: DC | PRN

## 2015-05-25 MED ORDER — CLONIDINE HCL 0.1 MG PO TABS
0.1000 mg | ORAL_TABLET | Freq: Three times a day (TID) | ORAL | Status: DC
  Administered 2015-05-26 – 2015-05-27 (×5): 0.1 mg via ORAL
  Filled 2015-05-25 (×5): qty 1

## 2015-05-25 MED ORDER — FUROSEMIDE 10 MG/ML IJ SOLN
80.0000 mg | Freq: Four times a day (QID) | INTRAMUSCULAR | Status: DC
  Administered 2015-05-26 (×3): 80 mg via INTRAVENOUS
  Filled 2015-05-25 (×3): qty 8

## 2015-05-25 MED ORDER — ACETAMINOPHEN 325 MG PO TABS
650.0000 mg | ORAL_TABLET | Freq: Four times a day (QID) | ORAL | Status: DC | PRN
  Administered 2015-05-30 – 2015-05-31 (×3): 650 mg via ORAL
  Filled 2015-05-25 (×3): qty 2

## 2015-05-25 MED ORDER — AMLODIPINE BESYLATE 10 MG PO TABS
10.0000 mg | ORAL_TABLET | Freq: Every day | ORAL | Status: DC
  Administered 2015-05-26 – 2015-06-03 (×9): 10 mg via ORAL
  Filled 2015-05-25 (×9): qty 1

## 2015-05-25 MED ORDER — SODIUM CHLORIDE 0.9 % IJ SOLN
3.0000 mL | Freq: Two times a day (BID) | INTRAMUSCULAR | Status: DC
  Administered 2015-05-26 – 2015-06-03 (×13): 3 mL via INTRAVENOUS

## 2015-05-25 MED ORDER — POLYETHYLENE GLYCOL 3350 17 G PO PACK: 17.0000 g | PACK | Freq: Every day | ORAL | Status: DC | PRN

## 2015-05-25 MED ORDER — LORATADINE 10 MG PO TABS
10.0000 mg | ORAL_TABLET | Freq: Every day | ORAL | Status: DC
  Administered 2015-05-26 – 2015-06-03 (×9): 10 mg via ORAL
  Filled 2015-05-25 (×9): qty 1

## 2015-05-25 MED ORDER — SODIUM PHOSPHATE 3 MMOLE/ML IV SOLN
20.0000 mmol | Freq: Once | INTRAVENOUS | Status: AC
  Administered 2015-05-26: 20 mmol via INTRAVENOUS
  Filled 2015-05-25: qty 6.67

## 2015-05-25 MED ORDER — INSULIN ASPART 100 UNIT/ML ~~LOC~~ SOLN
0.0000 [IU] | Freq: Three times a day (TID) | SUBCUTANEOUS | Status: DC
  Administered 2015-05-26 – 2015-05-29 (×7): 2 [IU] via SUBCUTANEOUS
  Administered 2015-05-29: 3 [IU] via SUBCUTANEOUS
  Administered 2015-05-30 – 2015-05-31 (×3): 1 [IU] via SUBCUTANEOUS
  Administered 2015-06-02: 3 [IU] via SUBCUTANEOUS

## 2015-05-25 MED ORDER — SODIUM CHLORIDE 0.9 % IV SOLN
2.0000 g | Freq: Once | INTRAVENOUS | Status: AC
  Administered 2015-05-25: 2 g via INTRAVENOUS
  Filled 2015-05-25: qty 20

## 2015-05-25 MED ORDER — ONDANSETRON HCL 4 MG/2ML IJ SOLN: 4.0000 mg | Freq: Four times a day (QID) | INTRAMUSCULAR | Status: DC | PRN

## 2015-05-25 MED ORDER — IRBESARTAN 300 MG PO TABS: 300.0000 mg | ORAL_TABLET | Freq: Every day | ORAL | Status: DC

## 2015-05-25 MED ORDER — PANTOPRAZOLE SODIUM 40 MG PO TBEC
40.0000 mg | DELAYED_RELEASE_TABLET | Freq: Every day | ORAL | Status: DC
  Administered 2015-05-26 – 2015-06-03 (×9): 40 mg via ORAL
  Filled 2015-05-25 (×10): qty 1

## 2015-05-25 MED ORDER — FUROSEMIDE 10 MG/ML IJ SOLN
80.0000 mg | Freq: Once | INTRAMUSCULAR | Status: AC
  Administered 2015-05-25: 80 mg via INTRAVENOUS
  Filled 2015-05-25: qty 8

## 2015-05-25 MED ORDER — HYDRALAZINE HCL 25 MG PO TABS
25.0000 mg | ORAL_TABLET | Freq: Two times a day (BID) | ORAL | Status: DC
  Administered 2015-05-26 – 2015-06-03 (×18): 25 mg via ORAL
  Filled 2015-05-25 (×18): qty 1

## 2015-05-25 MED ORDER — ENOXAPARIN SODIUM 30 MG/0.3ML ~~LOC~~ SOLN
30.0000 mg | SUBCUTANEOUS | Status: DC
  Administered 2015-05-26 – 2015-06-02 (×8): 30 mg via SUBCUTANEOUS
  Filled 2015-05-25 (×9): qty 0.3

## 2015-05-25 MED ORDER — INSULIN DETEMIR 100 UNIT/ML ~~LOC~~ SOLN
36.0000 [IU] | Freq: Every day | SUBCUTANEOUS | Status: DC
  Administered 2015-05-26 – 2015-06-03 (×9): 36 [IU] via SUBCUTANEOUS
  Filled 2015-05-25 (×9): qty 0.36

## 2015-05-25 MED ORDER — ATORVASTATIN CALCIUM 20 MG PO TABS
20.0000 mg | ORAL_TABLET | Freq: Every day | ORAL | Status: DC
  Administered 2015-05-26 – 2015-06-02 (×8): 20 mg via ORAL
  Filled 2015-05-25 (×9): qty 1

## 2015-05-25 MED ORDER — CALCITRIOL 0.25 MCG PO CAPS
0.2500 ug | ORAL_CAPSULE | Freq: Every day | ORAL | Status: DC
  Administered 2015-05-26 – 2015-05-28 (×3): 0.25 ug via ORAL
  Filled 2015-05-25 (×3): qty 1

## 2015-05-25 NOTE — Progress Notes (Signed)
Patient transferred from the ED via stretcher to room 3E08. Oriented patient to room equipment and educated patient and family to the heart failure floor. VSS. Called and confirmed placement of telemetry monitor and leads with CMT tech Chassidy. Currently patient complains of no pain or discomfort but does have some shortness of breath. Will continue to monitor patient to end of shift.

## 2015-05-25 NOTE — ED Notes (Signed)
Per pt and family, having swelling to her feet x 2 weeks but its getting progressively worse. Family now feels like pt is having swelling to abd and pt reports sob.

## 2015-05-25 NOTE — ED Provider Notes (Signed)
CSN: [id number]     Arrival date & time 05/25/15  1609 History   First MD Initiated Contact with Patient 05/25/15 1822     Chief Complaint  Patient presents with  . Shortness of Breath  . Leg Swelling     (Consider location/radiation/quality/duration/timing/severity/associated sxs/prior Treatment) HPI Comments: Patient presents with shortness of breath and leg swelling. She has a history of hypertension, diabetes, chronic kidney disease and past TIA. She reports that she is in the past had some mild leg swelling but it's been getting worse over the last several weeks. Over the last few days she's had some increased shortness of breath. She has some orthopnea. She denies any chest pain. She denies any fevers or coughing. She feels like her abdomen is swollen as well. She denies any vomiting or diarrhea. She is on Lasix and states that she's been taking her medication. She is on 40 mg twice a day. She denies any past history of CHF or any past cardiac problems. She does not see a cardiologist.  Patient is a 79 y.o. female presenting with shortness of breath.  Shortness of Breath Associated symptoms: no abdominal pain, no chest pain, no cough, no diaphoresis, no fever, no headaches, no rash and no vomiting     Past Medical History  Diagnosis Date  . Diabetes mellitus without complication ([place])   . Hyperlipidemia   . Hypertension   . CKD (chronic kidney disease) stage 3, GFR 30-59 ml/min   . TIA (transient ischemic attack) 2015   Past Surgical History  Procedure Laterality Date  . Abdominal hysterectomy  1974   Family History  Problem Relation Age of Onset  . Hypertension Other   . Hyperlipidemia Other   . Stroke Other   . Diabetes type II Mother   . Hypertension Mother   . Diabetes type II Father    Social History  Substance Use Topics  . Smoking status: Never Smoker   . Smokeless tobacco: Never Used  . Alcohol Use: No   OB History    No data available     Review of  Systems  Constitutional: Negative for fever, chills, diaphoresis and fatigue.  HENT: Negative for congestion, rhinorrhea and sneezing.   Eyes: Negative.   Respiratory: Positive for shortness of breath. Negative for cough and chest tightness.   Cardiovascular: Positive for leg swelling. Negative for chest pain.  Gastrointestinal: Negative for nausea, vomiting, abdominal pain, diarrhea and blood in stool.  Genitourinary: Negative for frequency, hematuria, flank pain and difficulty urinating.  Musculoskeletal: Negative for back pain and arthralgias.  Skin: Negative for rash.  Neurological: Negative for dizziness, speech difficulty, weakness, numbness and headaches.      Allergies  Penicillins  Home Medications   Prior to Admission medications   Medication Sig Start Date End Date Taking? Authorizing Provider  acetaminophen (TYLENOL) 500 MG tablet Take 1,000 mg by mouth every 8 (eight) hours as needed for mild pain.   Yes Historical Provider, MD  amLODipine (NORVASC) 10 MG tablet Take 10 mg by mouth daily.   Yes Historical Provider, MD  aspirin 325 MG tablet Take 325 mg by mouth daily.   Yes Historical Provider, MD  atorvastatin (LIPITOR) 20 MG tablet Take 20 mg by mouth daily at 6 PM.  06/03/14  Yes Historical Provider, MD  calcitRIOL (ROCALTROL) 0.25 MCG capsule Take 0.25 mcg by mouth daily.  06/26/12  Yes Historical Provider, MD  cloNIDine (CATAPRES) 0.1 MG tablet Take 0.1 mg by mouth 3 (three)  times daily.  05/30/14  Yes Historical Provider, MD  furosemide (LASIX) 40 MG tablet Take 40 mg by mouth 2 (two) times daily.   Yes Historical Provider, MD  hydrALAZINE (APRESOLINE) 25 MG tablet Take 25 mg by mouth 2 (two) times daily. With breakfast and supper 05/30/14  Yes Historical Provider, MD  Insulin Detemir (LEVEMIR) 100 UNIT/ML Pen Inject 36-38 Units into the skin 2 (two) times daily with a meal. Inject 36 units with breakfast and 38 units with supper   Yes Historical Provider, MD   loratadine (CLARITIN) 10 MG tablet Take 10 mg by mouth daily. 05/19/14  Yes Historical Provider, MD  olmesartan (BENICAR) 40 MG tablet Take 40 mg by mouth daily.   Yes Historical Provider, MD  omeprazole (PRILOSEC) 40 MG capsule Take 40 mg by mouth daily.   Yes Historical Provider, MD  polyethylene glycol (MIRALAX / GLYCOLAX) packet Take 17 g by mouth every hour as needed (constipation).   Yes Historical Provider, MD  cephALEXin (KEFLEX) 500 MG capsule Take 1 capsule (500 mg total) by mouth 4 (four) times daily. Patient not taking: Reported on 11/22/2014 03/08/14   Daleen Bo, MD   BP 163/78 mmHg  Pulse 105  Temp(Src) 98.9 F (37.2 C) (Oral)  Resp 18  Ht 5\' 1"  (1.549 m)  Wt 223 lb 6 oz (101.322 kg)  BMI 42.23 kg/m2  SpO2 99% Physical Exam  Constitutional: She is oriented to person, place, and time. She appears well-developed and well-nourished.  HENT:  Head: Normocephalic and atraumatic.  Eyes: Pupils are equal, round, and reactive to light.  Neck: Normal range of motion. Neck supple.  Cardiovascular: Normal rate, regular rhythm and normal heart sounds.   Pulmonary/Chest: Effort normal. No respiratory distress. She has no wheezes. She has rales. She exhibits no tenderness.  Abdominal: Soft. Bowel sounds are normal. There is no tenderness. There is no rebound and no guarding.  Musculoskeletal: Normal range of motion. She exhibits edema (bilateral 3+ pitting edema).  Lymphadenopathy:    She has no cervical adenopathy.  Neurological: She is alert and oriented to person, place, and time.  Skin: Skin is warm and dry. No rash noted.  Psychiatric: She has a normal mood and affect.    ED Course  Procedures (including critical care time) Labs Review Results for orders placed or performed during the hospital encounter of [id number]  Basic metabolic panel  Result Value Ref Range   Sodium 134 (L) 135 - 145 mmol/L   Potassium 4.6 3.5 - 5.1 mmol/L   Chloride 105 101 - 111 mmol/L   CO2 20  (L) 22 - 32 mmol/L   Glucose, Bld 233 (H) 65 - 99 mg/dL   BUN 16 6 - 20 mg/dL   Creatinine, Ser 2.34 (H) 0.44 - 1.00 mg/dL   Calcium 5.5 (LL) 8.9 - 10.3 mg/dL   GFR calc non Af Amer 19 (L) >60 mL/min   GFR calc Af Amer 22 (L) >60 mL/min   Anion gap 9 5 - 15  CBC  Result Value Ref Range   WBC 8.9 4.0 - 10.5 K/uL   RBC 4.14 3.87 - 5.11 MIL/uL   Hemoglobin 9.1 (L) 12.0 - 15.0 g/dL   HCT 30.5 (L) 36.0 - 46.0 %   MCV 73.7 (L) 78.0 - 100.0 fL   MCH 22.0 (L) 26.0 - 34.0 pg   MCHC 29.8 (L) 30.0 - 36.0 g/dL   RDW 18.1 (H) 11.5 - 15.5 %   Platelets 289 150 - 400  K/uL  Brain natriuretic peptide  Result Value Ref Range   B Natriuretic Peptide 195.2 (H) 0.0 - 100.0 pg/mL  Hepatic function panel  Result Value Ref Range   Total Protein 7.7 6.5 - 8.1 g/dL   Albumin 3.2 (L) 3.5 - 5.0 g/dL   AST 20 15 - 41 U/L   ALT 12 (L) 14 - 54 U/L   Alkaline Phosphatase 113 38 - 126 U/L   Total Bilirubin 0.3 0.3 - 1.2 mg/dL   Bilirubin, Direct <0.1 (L) 0.1 - 0.5 mg/dL   Indirect Bilirubin NOT CALCULATED 0.3 - 0.9 mg/dL  Urinalysis, Routine w reflex microscopic  Result Value Ref Range   Color, Urine STRAW (A) YELLOW   APPearance CLEAR CLEAR   Specific Gravity, Urine 1.006 1.005 - 1.030   pH 7.5 5.0 - 8.0   Glucose, UA NEGATIVE NEGATIVE mg/dL   Hgb urine dipstick NEGATIVE NEGATIVE   Bilirubin Urine NEGATIVE NEGATIVE   Ketones, ur NEGATIVE NEGATIVE mg/dL   Protein, ur 30 (A) NEGATIVE mg/dL   Urobilinogen, UA 0.2 0.0 - 1.0 mg/dL   Nitrite NEGATIVE NEGATIVE   Leukocytes, UA NEGATIVE NEGATIVE  Urine microscopic-add on  Result Value Ref Range   Squamous Epithelial / LPF FEW (A) RARE   WBC, UA 0-2 <3 WBC/hpf   RBC / HPF 0-2 <3 RBC/hpf   Bacteria, UA FEW (A) RARE  Phosphorus  Result Value Ref Range   Phosphorus 1.9 (L) 2.5 - 4.6 mg/dL  I-stat troponin, ED  Result Value Ref Range   Troponin i, poc 0.00 0.00 - 0.08 ng/mL   Comment 3           Dg Chest 2 View  05/25/2015  CLINICAL DATA:   79 year old female with a history of weight gain, hypertension, diabetes EXAM: CHEST - 2 VIEW COMPARISON:  10/14/2003 FINDINGS: Since the prior chest x-ray there has been interval enlargement of the cardiomediastinal silhouette including cardiac diameter. Atherosclerotic calcifications of the aortic arch. Fullness in the bilateral hilar region. Interlobular septal thickening with interstitial opacities. No confluent airspace disease. No pneumothorax. No large pleural effusion. No displaced fracture. Unremarkable appearance of the upper abdomen. IMPRESSION: Evidence of congestive heart failure, with cardiomegaly. Signed, Dulcy Fanny. Earleen Newport, DO [facility] Electronically Signed   By: Corrie Mckusick D.O.   On: 05/25/2015 17:33      Imaging Review Dg Chest 2 View  05/25/2015  CLINICAL DATA:  79 year old female with a history of weight gain, hypertension, diabetes EXAM: CHEST - 2 VIEW COMPARISON:  10/14/2003 FINDINGS: Since the prior chest x-ray there has been interval enlargement of the cardiomediastinal silhouette including cardiac diameter. Atherosclerotic calcifications of the aortic arch. Fullness in the bilateral hilar region. Interlobular septal thickening with interstitial opacities. No confluent airspace disease. No pneumothorax. No large pleural effusion. No displaced fracture. Unremarkable appearance of the upper abdomen. IMPRESSION: Evidence of congestive heart failure, with cardiomegaly. Signed, Dulcy Fanny. Earleen Newport, DO [facility] Electronically Signed   By: Corrie Mckusick D.O.   On: 05/25/2015 17:33   I have personally reviewed and evaluated these images and lab results as part of my medical decision-making.   EKG Interpretation   Date/Time:  Monday May 25 2015 16:46:56 EDT Ventricular Rate:  99 PR Interval:  150 QRS Duration: 72 QT Interval:  380 QTC Calculation: 487 R Axis:    39 Text Interpretation:  Normal sinus rhythm Septal infarct , age  undetermined Abnormal ECG since  last tracing no significant change  Confirmed by Dandra Shambaugh  MD, [place] Marchiano ([id number]) on 05/25/2015 6:24:21 PM      MDM   Final diagnoses:  Acute congestive heart failure, unspecified congestive heart failure type (HCC)  Hypocalcemia    Patient symptoms are consistent with a CHF exacerbation. Her chest x-ray shows evidence of pulmonary edema. Her troponin is negative. She does not have ischemic changes on EKG. I will give her Lasix here in the ED. She has chronic anemia. Her hemoglobin today is slightly lower than her past value. She recently got an Epogen shot a week ago. Her creatinine is elevated but appears to be similar to her baseline values. She does have some drop in her calcium level.  Dr. Hal Hope to admit pt.  Malvin Johns, MD 05/25/15 2325

## 2015-05-25 NOTE — ED Notes (Signed)
Patient placed on bedside commode. Urine sample obtained

## 2015-05-25 NOTE — H&P (Signed)
[facility] History and Physical  [name] [name] [id number] DOB: 09/06/1935 DOA: 05/25/2015  Referring physician: Dr. Tamera Punt. PCP: Maximino Greenland, MD  Specialists: Dr. Clover Mealy. Nephrologist.  Chief Complaint: Shortness of breath and peripheral edema.  HPI: [name] [name] is a 79 y.o. female with history of chronic kidney disease stage 3-4 pressors to the ER because of worsening shortness of breath and peripheral edema. Patient states patient's Lasix dose was increased last month and over the last 2 days it was further increased because of patient was having increasing fluid retention. Patient also was getting short of breath. In the ER patient was found to have significant edema of the lower extremities extending up to the abdomen with chest x-ray showing congestion. Patient states she still makes urine. Creatinine is about the baseline. Patient otherwise denies any chest pain nausea vomiting abdominal pain or diarrhea. Patient has been admitted for anasarca with worsening renal function and with prior 2-D echo showing EF of 65-70% with grade 1 diastolic dysfunction.   Review of Systems: As presented in the history of presenting illness, rest negative.  Past Medical History  Diagnosis Date  . Diabetes mellitus without complication ([place])   . Hyperlipidemia   . Hypertension   . CKD (chronic kidney disease) stage 3, GFR 30-59 ml/min   . TIA (transient ischemic attack) 2015   Past Surgical History  Procedure Laterality Date  . Abdominal hysterectomy  1974   Social History:  reports that she has never smoked. She has never used smokeless tobacco. She reports that she does not drink alcohol or use illicit drugs. Where does patient live home. Can patient participate in ADLs? Yes.  Allergies  Allergen Reactions  . Penicillins Swelling and Rash    Family History:  Family History  Problem Relation Age of Onset  . Hypertension Other   . Hyperlipidemia Other    . Stroke Other   . Diabetes type II Mother   . Hypertension Mother   . Diabetes type II Father       Prior to Admission medications   Medication Sig Start Date End Date Taking? Authorizing Provider  acetaminophen (TYLENOL) 500 MG tablet Take 1,000 mg by mouth every 8 (eight) hours as needed for mild pain.   Yes Historical Provider, MD  amLODipine (NORVASC) 10 MG tablet Take 10 mg by mouth daily.   Yes Historical Provider, MD  aspirin 325 MG tablet Take 325 mg by mouth daily.   Yes Historical Provider, MD  atorvastatin (LIPITOR) 20 MG tablet Take 20 mg by mouth daily at 6 PM.  06/03/14  Yes Historical Provider, MD  calcitRIOL (ROCALTROL) 0.25 MCG capsule Take 0.25 mcg by mouth daily.  06/26/12  Yes Historical Provider, MD  cloNIDine (CATAPRES) 0.1 MG tablet Take 0.1 mg by mouth 3 (three) times daily.  05/30/14  Yes Historical Provider, MD  furosemide (LASIX) 40 MG tablet Take 40 mg by mouth 2 (two) times daily.   Yes Historical Provider, MD  hydrALAZINE (APRESOLINE) 25 MG tablet Take 25 mg by mouth 2 (two) times daily. With breakfast and supper 05/30/14  Yes Historical Provider, MD  Insulin Detemir (LEVEMIR) 100 UNIT/ML Pen Inject 36-38 Units into the skin 2 (two) times daily with a meal. Inject 36 units with breakfast and 38 units with supper   Yes Historical Provider, MD  loratadine (CLARITIN) 10 MG tablet Take 10 mg by mouth daily. 05/19/14  Yes Historical Provider, MD  olmesartan (BENICAR) 40 MG tablet Take 40 mg by mouth  daily.   Yes Historical Provider, MD  omeprazole (PRILOSEC) 40 MG capsule Take 40 mg by mouth daily.   Yes Historical Provider, MD  polyethylene glycol (MIRALAX / GLYCOLAX) packet Take 17 g by mouth every hour as needed (constipation).   Yes Historical Provider, MD  cephALEXin (KEFLEX) 500 MG capsule Take 1 capsule (500 mg total) by mouth 4 (four) times daily. Patient not taking: Reported on 11/22/2014 03/08/14   Daleen Bo, MD    Physical Exam: Filed Vitals:    05/25/15 2115 05/25/15 2130 05/25/15 2200 05/25/15 2306  BP: 133/67 143/83 124/94 163/78  Pulse: 105 102 99 105  Temp:    98.9 F (37.2 C)  TempSrc:    Oral  Resp: 30 18 18 18   Height:      Weight:      SpO2: 98% 98% 98% 99%     General:  Obese not in distress.  Eyes: Anicteric no pallor.  ENT: No discharge from the ears eyes nose or mouth.  Neck: JVD elevated. No mass felt.  Cardiovascular: S1-S2 heard.  Respiratory: No rhonchi or crepitations.  Abdomen: Distended nontender bowel sounds present.  Skin: No rash.  Musculoskeletal: Bilateral lower extremity edema extending to the thighs.  Psychiatric: Appears normal.  Neurologic: Alert awake oriented to time place and person. Moves all extremities.  Labs on Admission:  Basic Metabolic Panel:  Recent Labs Lab 05/19/15 0934 05/19/15 0935 05/25/15 1712 05/25/15 2225  NA  --  135 134*  --   K  --  4.0 4.6  --   CL  --  102 105  --   CO2  --  24 20*  --   GLUCOSE  --  99 233*  --   BUN  --  24* 16  --   CREATININE  --  2.39* 2.34*  --   CALCIUM 7.0* 6.8* 5.5*  --   PHOS  --  2.5  --  1.9*   Liver Function Tests:  Recent Labs Lab 05/19/15 0935 05/25/15 2100  AST  --  20  ALT  --  12*  ALKPHOS  --  113  BILITOT  --  0.3  PROT  --  7.7  ALBUMIN 3.2* 3.2*   No results for input(s): LIPASE, AMYLASE in the last 168 hours. No results for input(s): AMMONIA in the last 168 hours. CBC:  Recent Labs Lab 05/19/15 0924 05/25/15 1712  WBC  --  8.9  HGB 9.8* 9.1*  HCT  --  30.5*  MCV  --  73.7*  PLT  --  289   Cardiac Enzymes: No results for input(s): CKTOTAL, CKMB, CKMBINDEX, TROPONINI in the last 168 hours.  BNP (last 3 results)  Recent Labs  05/25/15 1838  BNP 195.2*    ProBNP (last 3 results) No results for input(s): PROBNP in the last 8760 hours.  CBG:  Recent Labs Lab 05/25/15 2328  GLUCAP 161*    Radiological Exams on Admission: Dg Chest 2 View  05/25/2015  CLINICAL DATA:   79 year old female with a history of weight gain, hypertension, diabetes EXAM: CHEST - 2 VIEW COMPARISON:  10/14/2003 FINDINGS: Since the prior chest x-ray there has been interval enlargement of the cardiomediastinal silhouette including cardiac diameter. Atherosclerotic calcifications of the aortic arch. Fullness in the bilateral hilar region. Interlobular septal thickening with interstitial opacities. No confluent airspace disease. No pneumothorax. No large pleural effusion. No displaced fracture. Unremarkable appearance of the upper abdomen. IMPRESSION: Evidence of congestive heart failure, with  cardiomegaly. Signed, Dulcy Fanny. Earleen Newport, DO [facility] Electronically Signed   By: Corrie Mckusick D.O.   On: 05/25/2015 17:33    EKG: Independently reviewed. Normal sinus rhythm.  Assessment/Plan Principal Problem:   Anasarca Active Problems:   Essential hypertension   CHF (congestive heart failure) (HCC)   DM (diabetes mellitus), type 2 with renal complications (HCC)   Chronic anemia   Hypocalcemia   1. Anasarca - in a patient with history of chronic kidney disease stage 3-4 and diastolic dysfunction. At this time I have placed patient on Lasix 80 mg IV every 6 hourly. Check 2-D echo. Patient probably will need nephrology consult in a.m. Postoperative for intake output daily weights and metabolic panel. 2. Hypocalcemia and hypophosphatemia - patient will probably have vitamin D deficiency. Check vitamin D levels and parathormone levels. I have ordered IV calcium 2 g and also IV phosphorus replacement per pharmacy. Recheck levels in a.m. No EKG changes at this time. 3. Hypertension - I'm holding off ARB for now. We will continue other home medications. When necessary IV hydralazine for systolic blood pressure more than 160. 4. Chronic anemia probably from renal failure - follow CBC. 5. Diabetes mellitus - continue Effertz-acting insulin with sliding  scale coverage.  I have reviewed patient's old charts and labs. Personally reviewed patient's chest x-ray and EKG.   DVT Prophylaxis Lovenox.  Code Status: Full code.  Family Communication: Patient's daughter.  Disposition Plan: Admit to inpatient.    KAKRAKANDY,ARSHAD N. [facility] Pager [phone].  If 7PM-7AM, please contact night-coverage [email or web address] Password [facility] 05/25/2015, 11:37 PM

## 2015-05-26 ENCOUNTER — Encounter (HOSPITAL_COMMUNITY): Payer: Self-pay | Admitting: General Practice

## 2015-05-26 ENCOUNTER — Inpatient Hospital Stay (HOSPITAL_COMMUNITY): Payer: Medicare Other

## 2015-05-26 DIAGNOSIS — I5032 Chronic diastolic (congestive) heart failure: Secondary | ICD-10-CM

## 2015-05-26 DIAGNOSIS — E1121 Type 2 diabetes mellitus with diabetic nephropathy: Secondary | ICD-10-CM

## 2015-05-26 DIAGNOSIS — R0600 Dyspnea, unspecified: Secondary | ICD-10-CM

## 2015-05-26 LAB — GLUCOSE, CAPILLARY
Glucose-Capillary: 118 mg/dL — ABNORMAL HIGH (ref 65–99)
Glucose-Capillary: 155 mg/dL — ABNORMAL HIGH (ref 65–99)
Glucose-Capillary: 198 mg/dL — ABNORMAL HIGH (ref 65–99)
Glucose-Capillary: 81 mg/dL (ref 65–99)

## 2015-05-26 LAB — CBC WITH DIFFERENTIAL/PLATELET
Basophils Absolute: 0 10*3/uL (ref 0.0–0.1)
Basophils Relative: 0 %
Eosinophils Absolute: 0.1 10*3/uL (ref 0.0–0.7)
Eosinophils Relative: 2 %
HCT: 29.4 % — ABNORMAL LOW (ref 36.0–46.0)
Hemoglobin: 8.9 g/dL — ABNORMAL LOW (ref 12.0–15.0)
Lymphocytes Relative: 10 %
Lymphs Abs: 0.9 10*3/uL (ref 0.7–4.0)
MCH: 22.3 pg — ABNORMAL LOW (ref 26.0–34.0)
MCHC: 30.3 g/dL (ref 30.0–36.0)
MCV: 73.5 fL — ABNORMAL LOW (ref 78.0–100.0)
Monocytes Absolute: 0.6 10*3/uL (ref 0.1–1.0)
Monocytes Relative: 6 %
Neutro Abs: 7.5 10*3/uL (ref 1.7–7.7)
Neutrophils Relative %: 82 %
Platelets: 278 10*3/uL (ref 150–400)
RBC: 4 MIL/uL (ref 3.87–5.11)
RDW: 17.8 % — ABNORMAL HIGH (ref 11.5–15.5)
WBC: 9.1 10*3/uL (ref 4.0–10.5)

## 2015-05-26 LAB — COMPREHENSIVE METABOLIC PANEL
ALT: 15 U/L (ref 14–54)
AST: 20 U/L (ref 15–41)
Albumin: 3.1 g/dL — ABNORMAL LOW (ref 3.5–5.0)
Alkaline Phosphatase: 112 U/L (ref 38–126)
Anion gap: 8 (ref 5–15)
BUN: 17 mg/dL (ref 6–20)
CO2: 25 mmol/L (ref 22–32)
Calcium: 6.2 mg/dL — CL (ref 8.9–10.3)
Chloride: 103 mmol/L (ref 101–111)
Creatinine, Ser: 2.5 mg/dL — ABNORMAL HIGH (ref 0.44–1.00)
GFR calc Af Amer: 20 mL/min — ABNORMAL LOW (ref >60)
GFR calc non Af Amer: 17 mL/min — ABNORMAL LOW (ref >60)
Glucose, Bld: 115 mg/dL — ABNORMAL HIGH (ref 65–99)
Potassium: 4.4 mmol/L (ref 3.5–5.1)
Sodium: 136 mmol/L (ref 135–145)
Total Bilirubin: 0.5 mg/dL (ref 0.3–1.2)
Total Protein: 7.5 g/dL (ref 6.5–8.1)

## 2015-05-26 LAB — PROTEIN / CREATININE RATIO, URINE
Creatinine, Urine: 85.63 mg/dL
Protein Creatinine Ratio: 1.49 mg/mg{Cre} — ABNORMAL HIGH (ref 0.00–0.15)
Total Protein, Urine: 128 mg/dL

## 2015-05-26 LAB — TROPONIN I: Troponin I: <0.03 ng/mL (ref <0.031)

## 2015-05-26 MED ORDER — FUROSEMIDE 10 MG/ML IJ SOLN
80.0000 mg | Freq: Three times a day (TID) | INTRAMUSCULAR | Status: DC
  Administered 2015-05-27 – 2015-05-28 (×4): 80 mg via INTRAVENOUS
  Filled 2015-05-26 (×4): qty 8

## 2015-05-26 MED ORDER — HYDRALAZINE HCL 20 MG/ML IJ SOLN: 10.0000 mg | INTRAMUSCULAR | Status: DC | PRN

## 2015-05-26 MED ORDER — INSULIN DETEMIR 100 UNIT/ML ~~LOC~~ SOLN
38.0000 [IU] | Freq: Every day | SUBCUTANEOUS | Status: DC
  Filled 2015-05-26 (×5): qty 0.38

## 2015-05-26 NOTE — Progress Notes (Signed)
Pt refuse NIV for the night. Pt did state that she wear it at home when ask; pt refuse the Cpap for tonight. I stated to pt if change your mind or get SOB throughout night call your RN and let her know and ill come set up Cpap if pt wants it. Pt is stable at this time no distress or complications noted.  I saw that the order stated " nasal prongs" RT department don't carry nasal prongs. Only a nasal mask.

## 2015-05-26 NOTE — Progress Notes (Signed)
Notified on-call hospitalist for patient is very short of breath and is on IV Lasix every six hours, nurse as well as patient requested if she can have a foley catheter.Patient states has had a fall at home about three months ago and would feel safe considering her being aggressively diuresed. Orders given for foley catheter. [place] #14 was used and inserted successfully with assistance of the English as a second language teacher. Patient states she felt relieved of urine after foley catheter was inserted. Will continue to monitor patient to end of shift.

## 2015-05-26 NOTE — Plan of Care (Signed)
Problem: Phase I Progression Outcomes Goal: Dyspnea controlled at rest (HF) Outcome: Progressing Pt still has some dyspnea at rest and with activity Goal: Voiding-avoid urinary catheter unless indicated Outcome: Not Met (add Reason) Patient has foley due to aggressive diuresis and shortness of breath when going to the bedside commode, oxygen saturations drop.

## 2015-05-26 NOTE — Progress Notes (Signed)
Echocardiogram 2D Echocardiogram has been performed.  Joelene Millin 05/26/2015, 12:48 PM

## 2015-05-26 NOTE — Progress Notes (Signed)
Utilization review completed. Daviyon Widmayer, RN, BSN. 

## 2015-05-26 NOTE — Progress Notes (Signed)
CRITICAL VALUE ALERT  Critical value received:  Calcium 6.2  Date of notification:  05/26/2015  Time of notification:  0600  Critical value read back:Yes.    Nurse who received alert:  Jannifer Rodney  MD notified (1st page):  Kirby,NP- via text page  Time of first page:  0601  MD notified (2nd page):n/a  Time of second page:  Responding MD:  Samuella Bruin  Time MD responded:  [phone]

## 2015-05-26 NOTE — Progress Notes (Signed)
PROGRESS NOTE  [name] [name] [id number] DOB: 1936/01/01 DOA: 05/25/2015 PCP: Maximino Greenland, MD Brief history 79 year old female with a history of diabetes mellitus, CKD stage IV, hyperlipidemia, and anemia of CKD presents with 2 day history of increasing shortness breath, lower extremity edema, and orthopnea. Upon further questioning, the patient states that she has been short of breath and having worsening lower extremity edema for the past 2-3 weeks. In addition, she has had increasing abdominal girth. The patient had been taking furosemide 80 mg twice a day. However she was having a lot of cramping in her back and legs. As a result, her physician told her to decrease her furosemide to 40 mg twice a day over the past 2 weeks. The patient has a history of obstructive sleep apnea, but she has not been using her CPAP faithfully. In addition, she has been complaining of increasing orthopnea for which she has been sleeping in a recliner for several weeks.  Assessment/Plan: Acute diastolic on chronic diastolic CHF -Patient remains clinically fluid overloaded -05/26/2015 EF 60-65%, grade 1 DD -Continue intravenous furosemide -Daily weights -I's and O's -Certainly, the patient's hypoalbuminemia may be considering to her worsening edema -Check urine protein/creatinine ratio Hypertension -Continue amlodipine, clonidine, hydralazine -Discontinue ARB secondary to worsening renal function CKD stage IV -Baseline creatinine 2.2-2.5 -Monitor with diuresis Diabetes mellitus type 2  -hemoglobin A1c -half home dose of Levemir  -NovoLog sliding scale -Check urine protein creatinine ratio Hypocalcemia -supplement -check vitamin D History of TIA  -Continue aspirin  Hyperlipidemia  -Continue statin   Family Communication:   Pt at beside Disposition Plan:   Home in 2-3 days      Procedures/Studies: Dg Chest 2 View  05/25/2015  CLINICAL DATA:  79 year old female with a  history of weight gain, hypertension, diabetes EXAM: CHEST - 2 VIEW COMPARISON:  10/14/2003 FINDINGS: Since the prior chest x-ray there has been interval enlargement of the cardiomediastinal silhouette including cardiac diameter. Atherosclerotic calcifications of the aortic arch. Fullness in the bilateral hilar region. Interlobular septal thickening with interstitial opacities. No confluent airspace disease. No pneumothorax. No large pleural effusion. No displaced fracture. Unremarkable appearance of the upper abdomen. IMPRESSION: Evidence of congestive heart failure, with cardiomegaly. Signed, Dulcy Fanny. Earleen Newport, DO [facility] Electronically Signed   By: Corrie Mckusick D.O.   On: 05/25/2015 17:33         Subjective: Patient overall is breathing better. She is having some dyspnea on exertion. Denies any fevers, chills, chest discomfort, nausea, vomiting or diarrhea, vomiting. No dysuria hematuria. No headache or dizziness.   Objective: Filed Vitals:   05/26/15 0500 05/26/15 0618 05/26/15 1019 05/26/15 1430  BP:  148/75 136/72 125/78  Pulse:  107 117 103  Temp:  99 F (37.2 C) 99.8 F (37.7 C) 99 F (37.2 C)  TempSrc:  Oral Oral Oral  Resp:  18 18 19   Height:      Weight: 98.113 kg (216 lb 4.8 oz)     SpO2:  98% 100% 100%    Intake/Output Summary (Last 24 hours) at 05/26/15 1558 Last data filed at 05/26/15 1306  Gross per 24 hour  Intake 1339.67 ml  Output   2800 ml  Net -1460.33 ml   Weight change:  Exam:   General:  Pt is alert, follows commands appropriately, not in acute distress  HEENT: No icterus, No thrush, No neck mass, [place]/AT  Cardiovascular: RRR, S1/S2,  no rubs, no gallops  Respiratory: bibasilar of crackles. No wheezes   Abdomen: Soft/+BS, non tender, non distended, no guarding; no hepatosplenomegaly  Extremities: 2+LE edema, No lymphangitis, No petechiae, No rashes, no synovitis; no cyanosis or clubbing     Data Reviewed: Basic Metabolic Panel:  Recent Labs Lab 05/25/15 1712 05/25/15 2225 05/26/15 0518  NA 134*  --  136  K 4.6  --  4.4  CL 105  --  103  CO2 20*  --  25  GLUCOSE 233*  --  115*  BUN 16  --  17  CREATININE 2.34*  --  2.50*  CALCIUM 5.5*  --  6.2*  PHOS  --  1.9*  --    Liver Function Tests:  Recent Labs Lab 05/25/15 2100 05/26/15 0518  AST 20 20  ALT 12* 15  ALKPHOS 113 112  BILITOT 0.3 0.5  PROT 7.7 7.5  ALBUMIN 3.2* 3.1*   No results for input(s): LIPASE, AMYLASE in the last 168 hours. No results for input(s): AMMONIA in the last 168 hours. CBC:  Recent Labs Lab 05/25/15 1712 05/26/15 0518  WBC 8.9 9.1  NEUTROABS  --  7.5  HGB 9.1* 8.9*  HCT 30.5* 29.4*  MCV 73.7* 73.5*  PLT 289 278   Cardiac Enzymes:  Recent Labs Lab 05/26/15 0735  TROPONINI <0.03   BNP: Invalid input(s): POCBNP CBG:  Recent Labs Lab 05/25/15 2328 05/26/15 0616 05/26/15 1200  GLUCAP 161* 118* 155*    No results found for this or any previous visit (from the past 240 hour(s)).   Scheduled Meds: . amLODipine  10 mg Oral Daily  . aspirin  325 mg Oral Daily  . atorvastatin  20 mg Oral q1800  . calcitRIOL  0.25 mcg Oral Daily  . cloNIDine  0.1 mg Oral TID  . enoxaparin (LOVENOX) injection  30 mg Subcutaneous Q24H  . furosemide  80 mg Intravenous 4 times per day  . hydrALAZINE  25 mg Oral BID  . insulin aspart  0-9 Units Subcutaneous TID WC  . insulin detemir  36 Units Subcutaneous Daily  . insulin detemir  38 Units Subcutaneous QHS  . loratadine  10 mg Oral Daily  . pantoprazole  40 mg Oral Daily  . sodium chloride  3 mL Intravenous Q12H   Continuous Infusions:    Vale Peraza, DO  [facility] Pager [phone]  If 7PM-7AM, please contact night-coverage [email or web address] Password [email or web address] 05/26/2015, 3:58 PM   LOS: 1 day

## 2015-05-27 LAB — GLUCOSE, CAPILLARY
Glucose-Capillary: 110 mg/dL — ABNORMAL HIGH (ref 65–99)
Glucose-Capillary: 159 mg/dL — ABNORMAL HIGH (ref 65–99)
Glucose-Capillary: 181 mg/dL — ABNORMAL HIGH (ref 65–99)
Glucose-Capillary: 83 mg/dL (ref 65–99)

## 2015-05-27 LAB — COMPREHENSIVE METABOLIC PANEL
ALT: 11 U/L — ABNORMAL LOW (ref 14–54)
AST: 16 U/L (ref 15–41)
Albumin: 2.7 g/dL — ABNORMAL LOW (ref 3.5–5.0)
Alkaline Phosphatase: 99 U/L (ref 38–126)
Anion gap: 9 (ref 5–15)
BUN: 23 mg/dL — ABNORMAL HIGH (ref 6–20)
CO2: 25 mmol/L (ref 22–32)
Calcium: 5.6 mg/dL — CL (ref 8.9–10.3)
Chloride: 101 mmol/L (ref 101–111)
Creatinine, Ser: 3 mg/dL — ABNORMAL HIGH (ref 0.44–1.00)
GFR calc Af Amer: 16 mL/min — ABNORMAL LOW (ref >60)
GFR calc non Af Amer: 14 mL/min — ABNORMAL LOW (ref >60)
Glucose, Bld: 152 mg/dL — ABNORMAL HIGH (ref 65–99)
Potassium: 4.5 mmol/L (ref 3.5–5.1)
Sodium: 135 mmol/L (ref 135–145)
Total Bilirubin: 0.5 mg/dL (ref 0.3–1.2)
Total Protein: 6.6 g/dL (ref 6.5–8.1)

## 2015-05-27 LAB — CALCIUM, IONIZED: Calcium, Ionized, Serum: 3.1 mg/dL — ABNORMAL LOW (ref 4.5–5.6)

## 2015-05-27 LAB — VITAMIN D 25 HYDROXY (VIT D DEFICIENCY, FRACTURES): Vit D, 25-Hydroxy: 28.5 ng/mL — ABNORMAL LOW (ref 30.0–100.0)

## 2015-05-27 MED ORDER — VITAMIN D (ERGOCALCIFEROL) 1.25 MG (50000 UNIT) PO CAPS
50000.0000 [IU] | ORAL_CAPSULE | ORAL | Status: DC
  Administered 2015-05-27: 50000 [IU] via ORAL
  Filled 2015-05-27 (×2): qty 1

## 2015-05-27 MED ORDER — SODIUM CHLORIDE 0.9 % IV SOLN
2.0000 g | Freq: Once | INTRAVENOUS | Status: DC
  Filled 2015-05-27: qty 20

## 2015-05-27 MED ORDER — ISOSORBIDE MONONITRATE ER 30 MG PO TB24
30.0000 mg | ORAL_TABLET | Freq: Every day | ORAL | Status: DC
  Administered 2015-05-27 – 2015-06-03 (×8): 30 mg via ORAL
  Filled 2015-05-27 (×8): qty 1

## 2015-05-27 MED ORDER — CALCIUM GLUCONATE 10 % IV SOLN
2.0000 g | Freq: Once | INTRAVENOUS | Status: AC
  Administered 2015-05-27: 2 g via INTRAVENOUS
  Filled 2015-05-27: qty 20

## 2015-05-27 MED ORDER — CARVEDILOL 6.25 MG PO TABS
6.2500 mg | ORAL_TABLET | Freq: Two times a day (BID) | ORAL | Status: DC
Start: 2015-05-27 — End: 2015-05-29
  Administered 2015-05-27 – 2015-05-29 (×5): 6.25 mg via ORAL
  Filled 2015-05-27 (×5): qty 1

## 2015-05-27 NOTE — Progress Notes (Signed)
RT spoke to patient about wearing CPAP.  Pt stated that she doesn't want to wear but if she changes her mind will notify the RN.  RT will continue to monitor.

## 2015-05-27 NOTE — Final Progress Note (Signed)
Received order from medical Personnel for calcium gluconate at 0624 awaiting medication from Pharmacy to be given  Pt oob in recliner at bed side no acute distress noted .

## 2015-05-27 NOTE — Evaluation (Signed)
Physical Therapy Evaluation Patient Details Name: [name] [name] MRN: [id number] DOB: [birth year] Today's Date: 05/27/2015   History of Present Illness  79 year old female with a history of diabetes mellitus, CKD stage IV, hyperlipidemia, and anemia of CKD presents with 2 day history of increasing shortness breath, lower extremity edema, and orthopnea. Upon further questioning, the patient states that she has been short of breath and having worsening lower extremity edema for the past 2-3 weeks. In addition, she has had increasing abdominal girth. The patient had been taking furosemide 80 mg twice a day. However she was having a lot of cramping in her back and legs. As a result, her physician told her to decrease her furosemide to 40 mg twice a day over the past 2 weeks. The patient has a history of obstructive sleep apnea, but she has not been using her CPAP faithfully. In addition, she has been complaining of increasing orthopnea for which she has been sleeping in a recliner for several weeks.  Clinical Impression  Pt admitted with above diagnosis. Pt currently with functional limitations due to the deficits listed below (see PT Problem List). Pt ambulated with RW with good technique.  Will need to get RW for home as well as tub bench and 3N1.  Also may need home O2 as she desats with activity.  Will follow acutely.   Will benefit from skilled PT to increase their independence and safety with mobility to allow discharge to the venue listed below.      Follow Up Recommendations Home health PT;Supervision/Assistance - 24 hour    Equipment Recommendations  3in1 (PT) (rollator and tub bench, possibly home O2)    Recommendations for Other Services       Precautions / Restrictions Precautions Precautions: Fall Restrictions Weight Bearing Restrictions: No      Mobility  Bed Mobility               General bed mobility comments: pt in chair on arrival  Transfers Overall  transfer level: Needs assistance Equipment used: Rolling walker (2 wheeled) Transfers: Sit to/from Stand Sit to Stand: Min guard         General transfer comment: Took several attempts by pt.  She used momentum to power up.  She did not need assist physically however and she used proper hand placement.  Ambulation/Gait Ambulation/Gait assistance: Min guard Ambulation Distance (Feet): 90 Feet Assistive device: Rolling walker (2 wheeled) Gait Pattern/deviations: Step-through pattern;Decreased stride length;Wide base of support;Trunk flexed   Gait velocity interpretation: Below normal speed for age/gender General Gait Details: Pt used RW with good safety overall.  Slow gait but steady with RW.  Did show signs of fatigue when getting back to room.  Also desats off O2.  See below.    Stairs            Wheelchair Mobility    Modified Rankin (Stroke Patients Only)       Balance Overall balance assessment: Needs assistance         Standing balance support: Bilateral upper extremity supported;During functional activity Standing balance-Leahy Scale: Poor Standing balance comment: relies on UE support.  Can stand statically without bil UE support for seconds at a time.  She stood at sink and washed dentures and hands.                               Pertinent Vitals/Pain Pain Assessment: No/denies pain   SATURATION QUALIFICATIONS: (  This note is used to comply with regulatory documentation for home oxygen)  Patient Saturations on Room Air at Rest = 90-93%  Patient Saturations on Room Air while Ambulating = 79-85%  Patient Saturations on 2 Liters of oxygen while Ambulating = 92-95%  Please briefly explain why patient needs home oxygen:Pt needed O2 with activity to keep sats >90%.     Home Living Family/patient expects to be discharged to:: Private residence Living Arrangements: Spouse/significant other Available Help at Discharge: Family;Available 24  hours/day Type of Home: Apartment Home Access: Level entry     Home Layout: One level Home Equipment: Cane - single point      Prior Function Level of Independence: Independent with assistive device(s)         Comments: Per pt was using cane at times.     Hand Dominance   Dominant Hand: Right    Extremity/Trunk Assessment   Upper Extremity Assessment: Defer to OT evaluation           Lower Extremity Assessment: Generalized weakness      Cervical / Trunk Assessment: Normal  Communication   Communication: No difficulties  Cognition Arousal/Alertness: Awake/alert Behavior During Therapy: WFL for tasks assessed/performed Overall Cognitive Status: Within Functional Limits for tasks assessed                      General Comments      Exercises General Exercises - Lower Extremity Ankle Circles/Pumps: AROM;Both;10 reps;Seated [place] Arc Quad: AROM;Both;10 reps;Seated      Assessment/Plan    PT Assessment Patient needs continued PT services  PT Diagnosis Generalized weakness   PT Problem List Decreased activity tolerance;Decreased balance;Decreased mobility;Decreased knowledge of use of DME;Decreased knowledge of precautions;Decreased safety awareness  PT Treatment Interventions DME instruction;Gait training;Functional mobility training;Therapeutic activities;Therapeutic exercise;Balance training;Patient/family education   PT Goals (Current goals can be found in the Care Plan section) Acute Rehab PT Goals Patient Stated Goal: to get better PT Goal Formulation: With patient Time For Goal Achievement: 06/10/15 Potential to Achieve Goals: Good    Frequency Min 3X/week   Barriers to discharge        Co-evaluation               End of Session Equipment Utilized During Treatment: Gait belt;Oxygen Activity Tolerance: Patient limited by fatigue Patient left: in chair;with call bell/phone within reach Nurse Communication: Mobility status          Time: [id number] PT Time Calculation (min) (ACUTE ONLY): 17 min   Charges:   PT Evaluation $Initial PT Evaluation Tier I: 1 Procedure     PT G CodesDenice Paradise 06-Jun-2015, 9:38 AM  [facility] [phone] (pager)

## 2015-05-27 NOTE — Progress Notes (Addendum)
PROGRESS NOTE  [name] [name] [id number] DOB: 06-Jul-1936 DOA: 05/25/2015 PCP: Maximino Greenland, MD  Brief history 79 year old female with a history of diabetes mellitus, CKD stage IV, hyperlipidemia, and anemia of CKD presents with 2 day history of increasing shortness breath, lower extremity edema, and orthopnea. Upon further questioning, the patient states that she has been short of breath and having worsening lower extremity edema for the past 2-3 weeks. In addition, she has had increasing abdominal girth. The patient had been taking furosemide 80 mg twice a day. However she was having a lot of cramping in her back and legs. As a result, her physician told her to decrease her furosemide to 40 mg twice a day over the past 2 weeks. The patient has a history of obstructive sleep apnea, but she has not been using her CPAP faithfully. In addition, she has been complaining of increasing orthopnea for which she has been sleeping in a recliner for several weeks.  Assessment/Plan: Acute on chronic diastolic CHF -Patient remains clinically fluid overloaded -05/26/2015 EF 60-65%, grade 1 DD -Continue intravenous furosemide -Daily weights--unchanged -I's and O's-NEG [id number] -Certainly, the patient's hypoalbuminemia may be considering to her worsening edema -Check urine protein/creatinine ratio--1.49 -The patient remains clinically volume overloaded Hypertension -Continue amlodipine,  Hydralazine -Discontinue clonidine -Start carvedilol -Add Imdur -Discontinue ARB secondary to worsening renal function CKD stage IV -Baseline creatinine 2.2-2.5 -Monitor with diuresis -will need to tolerate worse kidney function to improve respiratory status Diabetes mellitus type 2  -hemoglobin A1c--pending -home dose of Levemir  -CBGs controlled -NovoLog sliding scale Hypocalcemia -supplement -check vitamin D--28 -Supplement 25 vitamin D -Check intact PTH -Continue calcitriol History  of TIA  -Continue aspirin  Hyperlipidemia  -Continue statin     Procedures/Studies: Dg Chest 2 View  05/25/2015  CLINICAL DATA:  78 year old female with a history of weight gain, hypertension, diabetes EXAM: CHEST - 2 VIEW COMPARISON:  10/14/2003 FINDINGS: Since the prior chest x-ray there has been interval enlargement of the cardiomediastinal silhouette including cardiac diameter. Atherosclerotic calcifications of the aortic arch. Fullness in the bilateral hilar region. Interlobular septal thickening with interstitial opacities. No confluent airspace disease. No pneumothorax. No large pleural effusion. No displaced fracture. Unremarkable appearance of the upper abdomen. IMPRESSION: Evidence of congestive heart failure, with cardiomegaly. Signed, Dulcy Fanny. Earleen Newport, DO [facility] Radiology Electronically Signed   By: Corrie Mckusick D.O.   On: 05/25/2015 17:33         Subjective: Overall, patient is breathing better but continues to have some dyspnea on exertion. Denies any fevers, chills, chest discomfort, nausea, vomiting, diarrhea, vomiting. No dysuria or hematuria. No rashes.  Objective: Filed Vitals:   05/26/15 2009 05/27/15 0136 05/27/15 0557 05/27/15 0900  BP: 157/65 151/72 147/74 134/67  Pulse: 98 102 105 103  Temp: 98 F (36.7 C) 99 F (37.2 C) 99.5 F (37.5 C) 98.6 F (37 C)  TempSrc: Oral Oral Oral Oral  Resp: 18 18 18 18   Height:      Weight:   98.131 kg (216 lb 5.4 oz)   SpO2: 100% 99% 99% 100%    Intake/Output Summary (Last 24 hours) at 05/27/15 1331 Last data filed at 05/27/15 0900  Gross per 24 hour  Intake    600 ml  Output   1750 ml  Net  -1150 ml   Weight change: -3.191 kg (-7 lb 0.6 oz) Exam:   General:  Pt is alert,  follows commands appropriately, not in acute distress  HEENT: No icterus, No thrush, No neck mass, Norco/AT  Cardiovascular: RRR, S1/S2, no rubs, no gallops + JVD  Respiratory:  Bibasilar crackles; no wheezing  Abdomen: Soft/+BS, non tender, non distended, no guarding  Extremities: 2+ LE edema, No lymphangitis, No petechiae, No rashes, no synovitis  Data Reviewed: Basic Metabolic Panel:  Recent Labs Lab 05/25/15 1712 05/25/15 2225 05/26/15 0518 05/27/15 0236  NA 134*  --  136 135  K 4.6  --  4.4 4.5  CL 105  --  103 101  CO2 20*  --  25 25  GLUCOSE 233*  --  115* 152*  BUN 16  --  17 23*  CREATININE 2.34*  --  2.50* 3.00*  CALCIUM 5.5*  --  6.2* 5.6*  PHOS  --  1.9*  --   --    Liver Function Tests:  Recent Labs Lab 05/25/15 2100 05/26/15 0518 05/27/15 0236  AST 20 20 16   ALT 12* 15 11*  ALKPHOS 113 112 99  BILITOT 0.3 0.5 0.5  PROT 7.7 7.5 6.6  ALBUMIN 3.2* 3.1* 2.7*   No results for input(s): LIPASE, AMYLASE in the last 168 hours. No results for input(s): AMMONIA in the last 168 hours. CBC:  Recent Labs Lab 05/25/15 1712 05/26/15 0518  WBC 8.9 9.1  NEUTROABS  --  7.5  HGB 9.1* 8.9*  HCT 30.5* 29.4*  MCV 73.7* 73.5*  PLT 289 278   Cardiac Enzymes:  Recent Labs Lab 05/26/15 0735  TROPONINI <0.03   BNP: Invalid input(s): POCBNP CBG:  Recent Labs Lab 05/26/15 1200 05/26/15 1712 05/26/15 2339 05/27/15 0604 05/27/15 1113  GLUCAP 155* 198* 81 110* 159*    No results found for this or any previous visit (from the past 240 hour(s)).   Scheduled Meds: . amLODipine  10 mg Oral Daily  . aspirin  325 mg Oral Daily  . atorvastatin  20 mg Oral q1800  . calcitRIOL  0.25 mcg Oral Daily  . cloNIDine  0.1 mg Oral TID  . enoxaparin (LOVENOX) injection  30 mg Subcutaneous Q24H  . furosemide  80 mg Intravenous 3 times per day  . hydrALAZINE  25 mg Oral BID  . insulin aspart  0-9 Units Subcutaneous TID WC  . insulin detemir  36 Units Subcutaneous Daily  . insulin detemir  38 Units Subcutaneous QHS  . loratadine  10 mg Oral Daily  . pantoprazole  40 mg Oral Daily  . sodium chloride  3 mL Intravenous Q12H   Continuous  Infusions:    Brandley Aldrete, DO  [facility] Pager [phone]  If 7PM-7AM, please contact night-coverage [email or web address] Password [email or web address] 05/27/2015, 1:31 PM   LOS: 2 days

## 2015-05-27 NOTE — Progress Notes (Signed)
CRITICAL VALUE ALERT  Critical value received:  Calcium  Level 5.6  Date of notification:  05/27/15  Time of notification:  0330  Critical value read back  yes  Nurse who received alert:  Carin Hock  RN  MD notified (1st page):  Tylene Fantasia NP  Time of first page:  0450  MD notified (2nd page): Tylene Fantasia NP  Time of second page: [phone]  Responding MD:   Tylene Fantasia NP  Time MD responded:  [phone]

## 2015-05-27 NOTE — Progress Notes (Signed)
SATURATION QUALIFICATIONS: (This note is used to comply with regulatory documentation for home oxygen)  Patient Saturations on Room Air at Rest = 90-93%  Patient Saturations on Room Air while Ambulating = 79-85%  Patient Saturations on 2 Liters of oxygen while Ambulating = 92-95%  Please briefly explain why patient needs home oxygen:Pt needed O2 with activity to keep sats >90%.  May need home O2.  Thanks. [place] [phone] (pager)

## 2015-05-28 LAB — BASIC METABOLIC PANEL
Anion gap: 9 (ref 5–15)
BUN: 28 mg/dL — ABNORMAL HIGH (ref 6–20)
CO2: 25 mmol/L (ref 22–32)
Calcium: 6 mg/dL — CL (ref 8.9–10.3)
Chloride: 103 mmol/L (ref 101–111)
Creatinine, Ser: 3.01 mg/dL — ABNORMAL HIGH (ref 0.44–1.00)
GFR calc Af Amer: 16 mL/min — ABNORMAL LOW (ref >60)
GFR calc non Af Amer: 14 mL/min — ABNORMAL LOW (ref >60)
Glucose, Bld: 101 mg/dL — ABNORMAL HIGH (ref 65–99)
Potassium: 4.2 mmol/L (ref 3.5–5.1)
Sodium: 137 mmol/L (ref 135–145)

## 2015-05-28 LAB — GLUCOSE, CAPILLARY
Glucose-Capillary: 121 mg/dL — ABNORMAL HIGH (ref 65–99)
Glucose-Capillary: 168 mg/dL — ABNORMAL HIGH (ref 65–99)
Glucose-Capillary: 171 mg/dL — ABNORMAL HIGH (ref 65–99)
Glucose-Capillary: 186 mg/dL — ABNORMAL HIGH (ref 65–99)
Glucose-Capillary: 93 mg/dL (ref 65–99)
Glucose-Capillary: 95 mg/dL (ref 65–99)

## 2015-05-28 LAB — HEMOGLOBIN A1C
Hgb A1c MFr Bld: 7.9 % — ABNORMAL HIGH (ref 4.8–5.6)
Mean Plasma Glucose: 180 mg/dL

## 2015-05-28 LAB — VITAMIN D 25 HYDROXY (VIT D DEFICIENCY, FRACTURES): Vit D, 25-Hydroxy: 23.6 ng/mL — ABNORMAL LOW (ref 30.0–100.0)

## 2015-05-28 MED ORDER — SODIUM CHLORIDE 0.9 % IV SOLN
2.0000 g | Freq: Once | INTRAVENOUS | Status: AC
  Administered 2015-05-28: 2 g via INTRAVENOUS
  Filled 2015-05-28: qty 20

## 2015-05-28 MED ORDER — CALCITRIOL 0.5 MCG PO CAPS
1.0000 ug | ORAL_CAPSULE | Freq: Every day | ORAL | Status: DC
  Administered 2015-05-29 – 2015-06-03 (×6): 1 ug via ORAL
  Filled 2015-05-28 (×6): qty 2

## 2015-05-28 MED ORDER — FUROSEMIDE 10 MG/ML IJ SOLN
100.0000 mg | Freq: Four times a day (QID) | INTRAVENOUS | Status: DC
  Administered 2015-05-28 – 2015-05-29 (×4): 100 mg via INTRAVENOUS
  Filled 2015-05-28 (×9): qty 10

## 2015-05-28 MED ORDER — CLONIDINE HCL 0.1 MG PO TABS
0.1000 mg | ORAL_TABLET | Freq: Two times a day (BID) | ORAL | Status: DC
  Administered 2015-05-28 – 2015-06-03 (×12): 0.1 mg via ORAL
  Filled 2015-05-28 (×12): qty 1

## 2015-05-28 NOTE — Care Management Important Message (Signed)
Important Message  Patient Details  Name: [name] [name] MRN: [id number] Date of Birth: 1935-09-20   Medicare Important Message Given:  Yes-second notification given    Delorse Lek 05/28/2015, 12:14 PM

## 2015-05-28 NOTE — Progress Notes (Signed)
CRITICAL VALUE ALERT  Critical value received:  Calcium 6.0   Date of notification:  05/28/2015   Time of notification:  [id number]  Critical value read back:Yes.    Nurse who received alert:  Ronnette Hila, RN   MD notified (1st page):  Tylene Fantasia, NP  Time of first page:  04:54  New order at 05:20 for Calcium Gluconate.

## 2015-05-28 NOTE — Progress Notes (Signed)
PROGRESS NOTE  [name] [name] [id number] DOB: 1936-01-09 DOA: 05/25/2015 PCP: Maximino Greenland, MD  Brief history 79 year old female with a history of diabetes mellitus, CKD stage IV, hyperlipidemia, and anemia of CKD presents with 2 day history of increasing shortness breath, lower extremity edema, and orthopnea. Upon further questioning, the patient states that she has been short of breath and having worsening lower extremity edema for the past 2-3 weeks. In addition, she has had increasing abdominal girth. The patient had been taking furosemide 80 mg twice a day. However she was having a lot of cramping in her back and legs. As a result, her physician told her to decrease her furosemide to 40 mg twice a day over the past 2 weeks. The patient has a history of obstructive sleep apnea, but she has not been using her CPAP faithfully. In addition, she has been complaining of increasing orthopnea for which she has been sleeping in a recliner for several weeks.  Assessment/Plan: Acute on chronic diastolic CHF -Patient remains fluid overloaded -05/26/2015 EF 60-65%, grade 1 DD -Continue intravenous furosemide -Daily weights -I's and O's. Patient negative by 3.8 L so far -Certainly, the patient's hypoalbuminemia may be considering to her worsening edema. Her chronic kidney disease is also contributing.  Essential Hypertension -Continue amlodipine,  Hydralazine -Patient was on clonidine at home. It is noted that this medicine has been discontinued during this hospitalization. Would like to avoid rebound hypertension. Resume at a lower dose. -Patient started on carvedilol and Imdur -Discontinued ARB secondary to worsening renal function  Acute on CKD stage IV -Baseline creatinine 2.2-2.5. Creatinine is climbing. Consult nephrology to assist with management.  Diabetes mellitus type 2  -hemoglobin A1c is 7.9 -home dose of Levemir  -CBGs controlled -NovoLog sliding  scale  Hypocalcemia vitamin D--28. PTH was recently checked and was elevated at 620. Patient continued on calcitriol. Calcium remains low despite correction for low albumin. Nephrology to address.  History of TIA  -Continue aspirin   Hyperlipidemia  -Continue statin   DVT prophylaxis: Lovenox CODE STATUS: Full code Family communication: Discussed with the patient Disposition: Continue current treatment. Mobilize. Anticipate discharge home when improved  Procedures/Studies: Dg Chest 2 View  05/25/2015  CLINICAL DATA:  79 year old female with a history of weight gain, hypertension, diabetes EXAM: CHEST - 2 VIEW COMPARISON:  10/14/2003 FINDINGS: Since the prior chest x-ray there has been interval enlargement of the cardiomediastinal silhouette including cardiac diameter. Atherosclerotic calcifications of the aortic arch. Fullness in the bilateral hilar region. Interlobular septal thickening with interstitial opacities. No confluent airspace disease. No pneumothorax. No large pleural effusion. No displaced fracture. Unremarkable appearance of the upper abdomen. IMPRESSION: Evidence of congestive heart failure, with cardiomegaly. Signed, Dulcy Fanny. Earleen Newport, DO [facility] Radiology Electronically Signed   By: Corrie Mckusick D.O.   On: 05/25/2015 17:33      Subjective: Patient is feeling better but remains short of breath. Denies any chest pain. Swelling in the legs is improving slowly. Denies nausea, vomiting.   Objective: Filed Vitals:   05/27/15 2317 05/28/15 0043 05/28/15 0520 05/28/15 0811  BP: 153/77 131/84 146/75 150/65  Pulse: 108 109 103 102  Temp: 99.6 F (37.6 C) 100.2 F (37.9 C) 100.2 F (37.9 C)   TempSrc: Oral Oral Oral   Resp: 18 18 18    Height:      Weight:   97.796 kg (215 lb 9.6 oz)   SpO2: 100%  100% 100%     Intake/Output Summary (Last 24 hours) at 05/28/15 0942 Last data filed at 05/28/15 0902  Gross per 24  hour  Intake    840 ml  Output   1750 ml  Net   -910 ml   Filed Weights   05/26/15 0500 05/27/15 0557 05/28/15 0520  Weight: 98.113 kg (216 lb 4.8 oz) 98.131 kg (216 lb 5.4 oz) 97.796 kg (215 lb 9.6 oz)    Weight change: -0.336 kg (-11.8 oz)   Exam:   General:  Pt is alert, not in acute distress  Cardiovascular: RRR, S1/S2, no rubs, no gallops + JVD  Respiratory: Bibasilar crackles; no wheezing  Abdomen: Soft/+BS, non tender, non distended, no guarding  Extremities: 2+ LE edema, No lymphangitis, No petechiae, No rashes, no synovitis  Data Reviewed: Basic Metabolic Panel:  Recent Labs Lab 05/25/15 1712 05/25/15 2225 05/26/15 0518 05/27/15 0236 05/28/15 0320  NA 134*  --  136 135 137  K 4.6  --  4.4 4.5 4.2  CL 105  --  103 101 103  CO2 20*  --  25 25 25   GLUCOSE 233*  --  115* 152* 101*  BUN 16  --  17 23* 28*  CREATININE 2.34*  --  2.50* 3.00* 3.01*  CALCIUM 5.5*  --  6.2* 5.6* 6.0*  PHOS  --  1.9*  --   --   --    Liver Function Tests:  Recent Labs Lab 05/25/15 2100 05/26/15 0518 05/27/15 0236  AST 20 20 16   ALT 12* 15 11*  ALKPHOS 113 112 99  BILITOT 0.3 0.5 0.5  PROT 7.7 7.5 6.6  ALBUMIN 3.2* 3.1* 2.7*   CBC:  Recent Labs Lab 05/25/15 1712 05/26/15 0518  WBC 8.9 9.1  NEUTROABS  --  7.5  HGB 9.1* 8.9*  HCT 30.5* 29.4*  MCV 73.7* 73.5*  PLT 289 278   Cardiac Enzymes:  Recent Labs Lab 05/26/15 0735  TROPONINI <0.03   CBG:  Recent Labs Lab 05/27/15 1113 05/27/15 1605 05/27/15 2130 05/28/15 0039 05/28/15 0553  GLUCAP 159* 181* 83 121* 93    No results found for this or any previous visit (from the past 240 hour(s)).   Scheduled Meds: . amLODipine  10 mg Oral Daily  . aspirin  325 mg Oral Daily  . atorvastatin  20 mg Oral q1800  . calcitRIOL  0.25 mcg Oral Daily  . carvedilol  6.25 mg Oral BID WC  . enoxaparin (LOVENOX) injection  30 mg Subcutaneous Q24H  . furosemide  80 mg Intravenous 3 times per day  . hydrALAZINE   25 mg Oral BID  . insulin aspart  0-9 Units Subcutaneous TID WC  . insulin detemir  36 Units Subcutaneous Daily  . insulin detemir  38 Units Subcutaneous QHS  . isosorbide mononitrate  30 mg Oral Daily  . loratadine  10 mg Oral Daily  . pantoprazole  40 mg Oral Daily  . sodium chloride  3 mL Intravenous Q12H  . Vitamin D (Ergocalciferol)  50,000 Units Oral Q7 days   Continuous Infusions:    Bonnielee Haff   [facility] Pager [phone]  If 7PM-7AM, please contact night-coverage [email or web address] Password [email or web address] 05/28/2015, 9:42 AM   LOS: 3 days

## 2015-05-28 NOTE — Progress Notes (Signed)
Pt refuses to wear CPAP at night.

## 2015-05-28 NOTE — Progress Notes (Signed)
   05/27/15 2318  What Happened  Was fall witnessed? No  Was patient injured? No  Patient found other (Comment) (standing beside chair)  Found by No one-pt stated they fell (pt told RN that she slid off chair to floor)  Stated prior activity other (comment) (sitting in chair sleeping prior to sliding out of chair)  Follow Up  MD notified Tylene Fantasia, NP  Time MD notified 2334  Adult Fall Risk Assessment  Risk Factor Category (scoring not indicated) Fall has occurred during this admission (document High fall risk)  Patient's Fall Risk High Fall Risk (>13 points)  Adult Fall Risk Interventions  Required Bundle Interventions *See Row Information* High fall risk - low, moderate, and high requirements implemented  Additional Interventions Fall risk signage  Fall with Injury Screening  Risk For Fall Injury- See Row Information  Nurse judgement  PCA/Epidural/Spinal Assessment  Respiratory Pattern Regular;Unlabored  Neurological  Neuro (WDL) WDL  Level of Consciousness Alert  Orientation Level Oriented X4  Cognition Appropriate at baseline  Speech Clear  Pupil Assessment  Yes  R Pupil Size (mm) 3  R Pupil Shape Round  R Pupil Reaction Brisk  L Pupil Size (mm) 3  L Pupil Shape Round  L Pupil Reaction Brisk  Motor Function/Sensation Assessment Motor response  RUE Motor Response Purposeful movement  LUE Motor Response Purposeful movement  RLE Motor Response Purposeful movement  LLE Motor Response Purposeful movement  Neuro Symptoms None  Glasgow Coma Scale  Eye Opening 4  Best Verbal Response (NON-intubated) 5  Best Motor Response 6  Glasgow Coma Scale Score 15  Musculoskeletal  Musculoskeletal (WDL) X  Assistive Device Front wheel walker  Generalized Weakness Yes  Weight Bearing Restrictions No  Integumentary  Integumentary (WDL) WDL     05/27/15 2317  Vitals  Temp 99.6 F (37.6 C)  Temp Source Oral  BP (!) 153/77 mmHg  BP Location Left Arm  BP Method Automatic   Patient Position (if appropriate) Sitting  Pulse Rate (!) 108  Pulse Rate Source Dinamap  Resp 18  Oxygen Therapy  SpO2 100 %  O2 Device Nasal Cannula  O2 Flow Rate (L/min) 3 L/min  Pain Assessment  Pain Assessment No/denies pain   RN entered room during report to fix leads. Pt standing beside chair placing pillows in chair. Pt notified that she needed to call for assistance to get up and that RN would place chair alarm under patient. Patient stated that she slid out of chair to floor in front of chair. Pt was not seen on floor by staff. Pt with no complaints of pain, no injury noted, pt stated she did not hit her head. Tylene Fantasia, NP paged. Patient asked that RN not wake family up at this time to notify them. Patient with yellow arm band and yellow socks on already. Call bell within reach, VSS. Door left open as pt is high fall risk and pt verbalized understanding to call RN if she needs to get up. CBG 121

## 2015-05-28 NOTE — Progress Notes (Signed)
Pt refuses to wear CPAP tonight. Pt encouraged to call RT if pt changes mind. No distress noted. 

## 2015-05-28 NOTE — Consult Note (Signed)
[name] [name] is a 79 y.o. 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 Patient reported that the Lasix dose was increased last month due to worsening edema and dyspnea and orthopnea.The pt was admitted for volume overload and chest x-ray showing CHF. Creat July 2016 was 2.51.  Since admission she is net neg UOP of 3.5 liters.  Her weight has decreased about 1 Kg.  Creat was 2.39 on Oct 11, 2.34 on Oct 17 and today 3.73m/dl. Calcium today was 6,0 with albumin of 2.5 and low vitamin D.  We are asked to assist with renal management.  EF is 60-65% by Echo.   Past Medical History  Diagnosis Date  . Hyperlipidemia   . Hypertension   . TIA (transient ischemic attack) 2015  . CHF (congestive heart failure) ([place]   . Sleep apnea     cpap  . Shortness of breath dyspnea   . Diabetes mellitus without complication (HCC)     insulin dependent  . CKD (chronic kidney disease) stage 3, GFR 30-59 ml/min   . Arthritis     RA   Past Surgical History  Procedure Laterality Date  . Abdominal hysterectomy  1974   Social History:  reports that she has never smoked. She has never used smokeless tobacco. She reports that she does not drink alcohol or use illicit drugs. Allergies:  Allergies  Allergen Reactions  . Penicillins Swelling and Rash   Family History  Problem Relation Age of Onset  . Hypertension Other   . Hyperlipidemia Other   . Stroke Other   . Diabetes type II Mother   . Hypertension Mother   . Diabetes type II Father     Medications:  Prior to Admission:  Prescriptions prior to admission  Medication Sig Dispense Refill Last Dose  . acetaminophen (TYLENOL) 500 MG tablet Take 1,000 mg by mouth every 8 (eight) hours as needed for mild pain.   05/25/2015 at Unknown time  . amLODipine (NORVASC) 10 MG tablet Take 10 mg by mouth  daily.   05/25/2015 at Unknown time  . aspirin 325 MG tablet Take 325 mg by mouth daily.   05/25/2015 at Unknown time  . atorvastatin (LIPITOR) 20 MG tablet Take 20 mg by mouth daily at 6 PM.   0 05/24/2015 at Unknown time  . calcitRIOL (ROCALTROL) 0.25 MCG capsule Take 0.25 mcg by mouth daily.    05/25/2015 at Unknown time  . cloNIDine (CATAPRES) 0.1 MG tablet Take 0.1 mg by mouth 3 (three) times daily.   0 05/25/2015 at Unknown time  . furosemide (LASIX) 40 MG tablet Take 40 mg by mouth 2 (two) times daily.   05/25/2015 at am  . hydrALAZINE (APRESOLINE) 25 MG tablet Take 25 mg by mouth 2 (two) times daily. With breakfast and supper  0 05/25/2015 at Unknown time  . Insulin Detemir (LEVEMIR) 100 UNIT/ML Pen Inject 36-38 Units into the skin 2 (two) times daily with a meal. Inject 36 units with breakfast and 38 units with supper   05/25/2015 at Unknown time  . loratadine (CLARITIN) 10 MG tablet Take 10 mg by mouth daily.  0 05/25/2015 at Unknown time  . olmesartan (BENICAR) 40 MG tablet Take 40 mg by mouth daily.   05/25/2015 at Unknown time  . omeprazole (PRILOSEC) 40 MG capsule Take 40 mg by mouth daily.  05/25/2015 at Unknown time  . polyethylene glycol (MIRALAX / GLYCOLAX) packet Take 17 g by mouth every hour as needed (constipation).   05/24/2015 at Unknown time  . cephALEXin (KEFLEX) 500 MG capsule Take 1 capsule (500 mg total) by mouth 4 (four) times daily. (Patient not taking: Reported on 11/22/2014) 28 capsule 0 Not Taking at Unknown time   Scheduled: . amLODipine  10 mg Oral Daily  . aspirin  325 mg Oral Daily  . atorvastatin  20 mg Oral q1800  . [START ON 05/29/2015] calcitRIOL  1 mcg Oral Daily  . carvedilol  6.25 mg Oral BID WC  . enoxaparin (LOVENOX) injection  30 mg Subcutaneous Q24H  . furosemide  100 mg Intravenous 4 times per day  . hydrALAZINE  25 mg Oral BID  . insulin aspart  0-9 Units Subcutaneous TID WC  . insulin detemir  36 Units Subcutaneous Daily  . insulin detemir   38 Units Subcutaneous QHS  . isosorbide mononitrate  30 mg Oral Daily  . loratadine  10 mg Oral Daily  . pantoprazole  40 mg Oral Daily  . sodium chloride  3 mL Intravenous Q12H  . Vitamin D (Ergocalciferol)  50,000 Units Oral Q7 days    ROS: as per HPI otherwise noncontributory   Blood pressure 138/71, pulse 98, temperature 99.1 F (37.3 C), temperature source Oral, resp. rate 18, height 5' (1.524 m), weight 97.796 kg (215 lb 9.6 oz), SpO2 100 %.  General appearance: alert and cooperative Head: Normocephalic, without obvious abnormality, atraumatic Eyes: negative Ears: normal TM's and external ear canals both ears Nose: Nares normal. Septum midline. Mucosa normal. No drainage or sinus tenderness. Throat: lips, mucosa, and tongue normal; teeth and gums normal Resp: diminished breath sounds bilaterally Chest wall: no tenderness Cardio: regular rate and rhythm, S1, S2 normal, no murmur, click, rub or gallop GI: soft, non-tender; bowel sounds normal; no masses,  no organomegaly Extremities: edema 3+ bilat Skin: Skin color, texture, turgor normal. No rashes or lesions Neurologic: Grossly normal Results for orders placed or performed during the hospital encounter of 05/25/15 (from the past 48 hour(s))  Protein / creatinine ratio, urine     Status: Abnormal   Collection Time: 05/26/15  4:45 PM  Result Value Ref Range   Creatinine, Urine 85.63 mg/dL   Total Protein, Urine 128 mg/dL    Comment: NO NORMAL RANGE ESTABLISHED FOR THIS TEST   Protein Creatinine Ratio 1.49 (H) 0.00 - 0.15 mg/mg[Cre]  Glucose, capillary     Status: Abnormal   Collection Time: 05/26/15  5:12 PM  Result Value Ref Range   Glucose-Capillary 198 (H) 65 - 99 mg/dL  Glucose, capillary     Status: None   Collection Time: 05/26/15 11:39 PM  Result Value Ref Range   Glucose-Capillary 81 65 - 99 mg/dL   Comment 1 Notify RN    Comment 2 Document in Chart   Vit D  25 hydroxy (rtn osteoporosis monitoring)     Status:  Abnormal   Collection Time: 05/27/15  2:36 AM  Result Value Ref Range   Vit D, 25-Hydroxy 23.6 (L) 30.0 - 100.0 ng/mL    Comment: (NOTE) Vitamin D deficiency has been defined by the Institute of Medicine and an Endocrine Society practice guideline as a level of serum 25-OH vitamin D less than 20 ng/mL (1,2). The Endocrine Society went on to further define vitamin D insufficiency as a level between 21 and 29 ng/mL (2). 1. IOM (Institute of Medicine). 2010.  Dietary reference   intakes for calcium and D. Ezel: The   [employer]. 2. Holick MF, Binkley [place], Bischoff-Ferrari HA, et al.   Evaluation, treatment, and prevention of vitamin D   deficiency: an Endocrine Society clinical practice   guideline. JCEM. 2011 Jul; 96(7):1911-30. Performed At: [facility] [place], [place] [id number] Lindon Romp MD [id number]   Hemoglobin A1c     Status: Abnormal   Collection Time: 05/27/15  2:36 AM  Result Value Ref Range   Hgb A1c MFr Bld 7.9 (H) 4.8 - 5.6 %    Comment: (NOTE)         Pre-diabetes: 5.7 - 6.4         Diabetes: >6.4         Glycemic control for adults with diabetes: <7.0    Mean Plasma Glucose 180 mg/dL    Comment: (NOTE) Performed At: [facility] [place], [place] [id number] Lindon Romp MD [id number]   Comprehensive metabolic panel     Status: Abnormal   Collection Time: 05/27/15  2:36 AM  Result Value Ref Range   Sodium 135 135 - 145 mmol/L   Potassium 4.5 3.5 - 5.1 mmol/L   Chloride 101 101 - 111 mmol/L   CO2 25 22 - 32 mmol/L   Glucose, Bld 152 (H) 65 - 99 mg/dL   BUN 23 (H) 6 - 20 mg/dL   Creatinine, Ser 3.00 (H) 0.44 - 1.00 mg/dL   Calcium 5.6 (LL) 8.9 - 10.3 mg/dL    Comment: CRITICAL RESULT CALLED TO, READ BACK BY AND VERIFIED WITH: EDOH E,RN 05/27/15 0325 WAYK    Total Protein 6.6 6.5 - 8.1 g/dL   Albumin 2.7 (L) 3.5 - 5.0 g/dL   AST 16 15 - 41 U/L   ALT 11 (L) 14 - 54 U/L    Alkaline Phosphatase 99 38 - 126 U/L   Total Bilirubin 0.5 0.3 - 1.2 mg/dL   GFR calc non Af Amer 14 (L) >60 mL/min   GFR calc Af Amer 16 (L) >60 mL/min    Comment: (NOTE) The eGFR has been calculated using the CKD EPI equation. This calculation has not been validated in all clinical situations. eGFR's persistently <60 mL/min signify possible Chronic Kidney Disease.    Anion gap 9 5 - 15  Glucose, capillary     Status: Abnormal   Collection Time: 05/27/15  6:04 AM  Result Value Ref Range   Glucose-Capillary 110 (H) 65 - 99 mg/dL  Glucose, capillary     Status: Abnormal   Collection Time: 05/27/15 11:13 AM  Result Value Ref Range   Glucose-Capillary 159 (H) 65 - 99 mg/dL   Comment 1 Notify RN    Comment 2 Document in Chart   Glucose, capillary     Status: Abnormal   Collection Time: 05/27/15  4:05 PM  Result Value Ref Range   Glucose-Capillary 181 (H) 65 - 99 mg/dL   Comment 1 Notify RN    Comment 2 Document in Chart   Glucose, capillary     Status: None   Collection Time: 05/27/15  9:30 PM  Result Value Ref Range   Glucose-Capillary 83 65 - 99 mg/dL  Glucose, capillary     Status: Abnormal   Collection Time: 05/28/15 12:39 AM  Result Value Ref Range   Glucose-Capillary 121 (H) 65 - 99 mg/dL  Basic metabolic panel     Status: Abnormal   Collection Time:  05/28/15  3:20 AM  Result Value Ref Range   Sodium 137 135 - 145 mmol/L   Potassium 4.2 3.5 - 5.1 mmol/L   Chloride 103 101 - 111 mmol/L   CO2 25 22 - 32 mmol/L   Glucose, Bld 101 (H) 65 - 99 mg/dL   BUN 28 (H) 6 - 20 mg/dL   Creatinine, Ser 3.01 (H) 0.44 - 1.00 mg/dL   Calcium 6.0 (LL) 8.9 - 10.3 mg/dL    Comment: CRITICAL RESULT CALLED TO, READ BACK BY AND VERIFIED WITH: ROSENBERGER,M RN 05/28/2015 0454 JORDANS    GFR calc non Af Amer 14 (L) >60 mL/min   GFR calc Af Amer 16 (L) >60 mL/min    Comment: (NOTE) The eGFR has been calculated using the CKD EPI equation. This calculation has not been validated in all  clinical situations. eGFR's persistently <60 mL/min signify possible Chronic Kidney Disease.    Anion gap 9 5 - 15  Glucose, capillary     Status: None   Collection Time: 05/28/15  5:53 AM  Result Value Ref Range   Glucose-Capillary 93 65 - 99 mg/dL   No results found.  Assessment:  1 CKD 4/5 2 Volume overload 3 Hypocalcemia/ vita d defic 4 hypophos  Plan: 1  Increase furosemide 2  Increase vitamin D 3  check PTH 4  Dialysis education  Irelynd Zumstein C 05/28/2015, 12:13 PM

## 2015-05-29 LAB — RENAL FUNCTION PANEL
Albumin: 2.7 g/dL — ABNORMAL LOW (ref 3.5–5.0)
Anion gap: 10 (ref 5–15)
BUN: 28 mg/dL — ABNORMAL HIGH (ref 6–20)
CO2: 26 mmol/L (ref 22–32)
Calcium: 6.3 mg/dL — CL (ref 8.9–10.3)
Chloride: 100 mmol/L — ABNORMAL LOW (ref 101–111)
Creatinine, Ser: 2.83 mg/dL — ABNORMAL HIGH (ref 0.44–1.00)
GFR calc Af Amer: 17 mL/min — ABNORMAL LOW (ref >60)
GFR calc non Af Amer: 15 mL/min — ABNORMAL LOW (ref >60)
Glucose, Bld: 119 mg/dL — ABNORMAL HIGH (ref 65–99)
Phosphorus: 4.1 mg/dL (ref 2.5–4.6)
Potassium: 3.9 mmol/L (ref 3.5–5.1)
Sodium: 136 mmol/L (ref 135–145)

## 2015-05-29 LAB — CBC
HCT: 26.2 % — ABNORMAL LOW (ref 36.0–46.0)
Hemoglobin: 7.8 g/dL — ABNORMAL LOW (ref 12.0–15.0)
MCH: 21.5 pg — ABNORMAL LOW (ref 26.0–34.0)
MCHC: 29.8 g/dL — ABNORMAL LOW (ref 30.0–36.0)
MCV: 72.2 fL — ABNORMAL LOW (ref 78.0–100.0)
Platelets: 296 10*3/uL (ref 150–400)
RBC: 3.63 MIL/uL — ABNORMAL LOW (ref 3.87–5.11)
RDW: 17 % — ABNORMAL HIGH (ref 11.5–15.5)
WBC: 7.2 10*3/uL (ref 4.0–10.5)

## 2015-05-29 LAB — GLUCOSE, CAPILLARY
Glucose-Capillary: 116 mg/dL — ABNORMAL HIGH (ref 65–99)
Glucose-Capillary: 185 mg/dL — ABNORMAL HIGH (ref 65–99)
Glucose-Capillary: 209 mg/dL — ABNORMAL HIGH (ref 65–99)
Glucose-Capillary: 81 mg/dL (ref 65–99)

## 2015-05-29 MED ORDER — INSULIN DETEMIR 100 UNIT/ML ~~LOC~~ SOLN
15.0000 [IU] | Freq: Every day | SUBCUTANEOUS | Status: DC
  Administered 2015-05-29 – 2015-06-02 (×5): 15 [IU] via SUBCUTANEOUS
  Filled 2015-05-29 (×6): qty 0.15

## 2015-05-29 MED ORDER — POLYETHYLENE GLYCOL 3350 17 G PO PACK
17.0000 g | PACK | Freq: Every day | ORAL | Status: DC
  Administered 2015-05-29 – 2015-06-01 (×4): 17 g via ORAL
  Filled 2015-05-29 (×6): qty 1

## 2015-05-29 MED ORDER — FUROSEMIDE 10 MG/ML IJ SOLN
160.0000 mg | Freq: Four times a day (QID) | INTRAMUSCULAR | Status: DC
  Administered 2015-05-29 – 2015-06-02 (×12): 160 mg via INTRAVENOUS
  Filled 2015-05-29 (×18): qty 16

## 2015-05-29 MED ORDER — CARVEDILOL 6.25 MG PO TABS
6.2500 mg | ORAL_TABLET | Freq: Two times a day (BID) | ORAL | Status: DC
Start: 2015-05-30 — End: 2015-06-03
  Administered 2015-05-30 – 2015-06-03 (×9): 6.25 mg via ORAL
  Filled 2015-05-29 (×9): qty 1

## 2015-05-29 MED ORDER — DOCUSATE SODIUM 100 MG PO CAPS
100.0000 mg | ORAL_CAPSULE | Freq: Two times a day (BID) | ORAL | Status: DC
  Administered 2015-05-29 – 2015-06-02 (×9): 100 mg via ORAL
  Filled 2015-05-29 (×11): qty 1

## 2015-05-29 NOTE — Progress Notes (Signed)
cpap set up for patient and placed on. Pt states the settings do not feel comfortable; settings adjusted multiple times and patient states she feels like she is suffocating and needs to take the mask off. Encouraged patient to call if she changes her mind and would like to try again, she communicates understanding.

## 2015-05-29 NOTE — Progress Notes (Signed)
Pt very noncompliant to instructions to call for assistance in getting OOB.

## 2015-05-29 NOTE — Progress Notes (Addendum)
Physical Therapy Treatment Patient Details Name: [name] [name] MRN: [id number] DOB: 04/29/1936 Today's Date: 05/29/2015    History of Present Illness 79 year old female with a history of diabetes mellitus, CKD stage IV, hyperlipidemia, and anemia of CKD presents with 2 day history of increasing shortness breath, lower extremity edema, and orthopnea.     PT Comments    Pt progressing well, continues to fatigue but incr amb distance today  Follow Up Recommendations  Home health PT     Equipment Recommendations  3in1 (PT) (rollator and tub bench)    Recommendations for Other Services       Precautions / Restrictions Precautions Precautions: Fall Restrictions Weight Bearing Restrictions: No    Mobility  Bed Mobility               General bed mobility comments: pt in chair on arrival  Transfers Overall transfer level: Needs assistance Equipment used: Rolling walker (2 wheeled) Transfers: Sit to/from Stand Sit to Stand: Min guard         General transfer comment: pt able to stand on first attempt this session, cues for hand placement and control of descent  Ambulation/Gait Ambulation/Gait assistance: Min guard Ambulation Distance (Feet): 120 Feet (10' more to [facility]) Assistive device: Rolling walker (2 wheeled) Gait Pattern/deviations: Step-through pattern;Decreased stride length;Trunk flexed;Wide base of support     General Gait Details: occasional verbal cues for RW position and breathing; DOE 2/4; on 2L O2 throughout with Sats stable 99%   Stairs            Wheelchair Mobility    Modified Rankin (Stroke Patients Only)       Balance           Standing balance support: During functional activity;No upper extremity supported Standing balance-Leahy Scale: Fair Standing balance comment: pt able to stand without UE support; pt stood from [facility],  able to perform hygiene with supervision                    Cognition  Arousal/Alertness: Awake/alert Behavior During Therapy: WFL for tasks assessed/performed Overall Cognitive Status: Within Functional Limits for tasks assessed                      Exercises      General Comments        Pertinent Vitals/Pain Pain Assessment: No/denies pain    Home Living                      Prior Function            PT Goals (current goals can now be found in the care plan section) Acute Rehab PT Goals Patient Stated Goal: to get better PT Goal Formulation: With patient Time For Goal Achievement: 06/10/15 Potential to Achieve Goals: Good Progress towards PT goals: Progressing toward goals    Frequency  Min 3X/week    PT Plan Current plan remains appropriate    Co-evaluation             End of Session Equipment Utilized During Treatment: Gait belt;Oxygen Activity Tolerance: Patient tolerated treatment well;Patient limited by fatigue Patient left: in chair;with call bell/phone within reach;with nursing/sitter in room     Time: 1145-1208 PT Time Calculation (min) (ACUTE ONLY): 23 min  Charges:  $Gait Training: 23-37 mins                    G Codes:  [facility] 05/29/2015, 12:34 PM

## 2015-05-29 NOTE — Care Management Note (Signed)
Case Management Note  Patient Details  Name: [name] [name] MRN: [id number] Date of Birth: [birth year]  Subjective/Objective:            Admitted with CHF        Action/Plan: Patient lives at home with spouse, PCP is Dr Bryon Lions. Has medical insurance with Medicare / [employer] with prescription drug coverage; pharmacy of choice is [facility], patient states that she does not have any problem getting her medication. Patient could benefit from a Disease Management Program for CHF. HHC choice offered, patient chose [facility]. Mary with [facility] called for arrangements. Attending MD at discharge please enter the face to face document for [facility] services.  Expected Discharge Date:     Possibly 06/03/2015             Expected Discharge Plan:  [place]  Discharge planning Services  CM Consult Choice offered to:  Patient   HH Arranged:  Disease Management, RN, PT [place] Agency:  [facility]  Status of Service:  In process, will continue to follow  Medicare Important Message Given:  [facility] notification given  Sherrilyn Rist [phone] 05/29/2015, 1:34 PM

## 2015-05-29 NOTE — Progress Notes (Signed)
PROGRESS NOTE  [name] [name] [id number] DOB: Feb 17, 1936 DOA: 05/25/2015 PCP: Maximino Greenland, MD  Brief history 79 year old female with a history of diabetes mellitus, CKD stage IV, hyperlipidemia, and anemia of CKD presents with 2 day history of increasing shortness breath, lower extremity edema, and orthopnea. Upon further questioning, the patient stated that she has been short of breath and having worsening lower extremity edema for the past 2-3 weeks. In addition, she has had increasing abdominal girth. The patient had been taking furosemide 80 mg twice a day. However she was having a lot of cramping in her back and legs. As a result, her physician told her to decrease her furosemide to 40 mg twice a day over the past 2 weeks. The patient has a history of obstructive sleep apnea, but she has not been using her CPAP faithfully. In addition, she has been complaining of increasing orthopnea for which she has been sleeping in a recliner for several weeks.  Assessment/Plan: Acute on chronic diastolic CHF/anasarca -Patient remains fluid overloaded. Her chronic kidney disease is contributing. -05/26/2015 EF 60-65%, grade 1 DD -. Appreciate nephrology input. She is now on high-dose Lasix. -Daily weights -I's and O's.  Ins and outs not being charted accurately. Nurse told me that patient has had a few episodes of incontinence.  Acute on CKD stage IV -Baseline creatinine 2.2-2.5. Appreciate nephrology input. Creatinine is slightly better today. Continue to monitor urine output.   Essential Hypertension -Continue amlodipine,  Hydralazine -Patient was on clonidine at home. It is noted that this medicine had been discontinued earlier during this hospitalization. Would like to avoid rebound hypertension. So Clonidine was resumed at a lower dose. -Patient started on carvedilol and Imdur -Discontinued ARB secondary to worsening renal function  Diabetes mellitus type 2   -hemoglobin A1c is 7.9 -Continue home dose of Levemir  -CBGs controlled -NovoLog sliding scale  Hypocalcemia vitamin D--28. PTH was recently checked and was elevated at 620. Patient continued on calcitriol. Calcium remains low despite correction for low albumin. The dose of calcitriol has been increased.  History of TIA  -Continue aspirin   Hyperlipidemia  -Continue statin   Anemia likely secondary to chronic kidney disease Hemoglobin slightly lower today compared to yesterday. No overt bleeding. Monitor for now. No indication for transfusion. Nephrology to determine if she will benefit from Epogen.  DVT prophylaxis: Lovenox CODE STATUS: Full code Family communication: Discussed with the patient Disposition: Continue current treatment. Mobilize.    Procedures/Studies: Dg Chest 2 View  05/25/2015  CLINICAL DATA:  79 year old female with a history of weight gain, hypertension, diabetes EXAM: CHEST - 2 VIEW COMPARISON:  10/14/2003 FINDINGS: Since the prior chest x-ray there has been interval enlargement of the cardiomediastinal silhouette including cardiac diameter. Atherosclerotic calcifications of the aortic arch. Fullness in the bilateral hilar region. Interlobular septal thickening with interstitial opacities. No confluent airspace disease. No pneumothorax. No large pleural effusion. No displaced fracture. Unremarkable appearance of the upper abdomen. IMPRESSION: Evidence of congestive heart failure, with cardiomegaly. Signed, Dulcy Fanny. Earleen Newport, DO [facility] Electronically Signed   By: Corrie Mckusick D.O.   On: 05/25/2015 17:33      Subjective: Patient feels better this morning. Still complains of some shortness of breath. No chest pain. No nausea or vomiting. Swelling in the legs is improved.  Objective: Filed Vitals:   05/28/15 1808 05/28/15 2030 05/29/15 0613 05/29/15 0805  BP: 145/63 157/67 152/80  150/62   Pulse: 101 95 103 102  Temp:  99.5 F (37.5 C) 99.6 F (37.6 C)   TempSrc:  Oral Oral   Resp:  18 18   Height:      Weight:   98.476 kg (217 lb 1.6 oz)   SpO2:  100%      Intake/Output Summary (Last 24 hours) at 05/29/15 1001 Last data filed at 05/29/15 0953  Gross per 24 hour  Intake   1140 ml  Output   1200 ml  Net    -60 ml   Filed Weights   05/27/15 0557 05/28/15 0520 05/29/15 0613  Weight: 98.131 kg (216 lb 5.4 oz) 97.796 kg (215 lb 9.6 oz) 98.476 kg (217 lb 1.6 oz)    Weight change: 0.68 kg (1 lb 8 oz)   Exam:   General:  Pt is alert, not in acute distress  Cardiovascular: RRR, S1/S2, no rubs, no gallops + JVD  Respiratory: Bibasilar crackles; no wheezing  Abdomen: Soft/+BS, non tender, non distended, no guarding  Extremities: 2+ LE edema   Data Reviewed: Basic Metabolic Panel:  Recent Labs Lab 05/25/15 1712 05/25/15 2225 05/26/15 0518 05/27/15 0236 05/28/15 0320 05/29/15 0215  NA 134*  --  136 135 137 136  K 4.6  --  4.4 4.5 4.2 3.9  CL 105  --  103 101 103 100*  CO2 20*  --  25 25 25 26   GLUCOSE 233*  --  115* 152* 101* 119*  BUN 16  --  17 23* 28* 28*  CREATININE 2.34*  --  2.50* 3.00* 3.01* 2.83*  CALCIUM 5.5*  --  6.2* 5.6* 6.0* 6.3*  PHOS  --  1.9*  --   --   --  4.1   Liver Function Tests:  Recent Labs Lab 05/25/15 2100 05/26/15 0518 05/27/15 0236 05/29/15 0215  AST 20 20 16   --   ALT 12* 15 11*  --   ALKPHOS 113 112 99  --   BILITOT 0.3 0.5 0.5  --   PROT 7.7 7.5 6.6  --   ALBUMIN 3.2* 3.1* 2.7* 2.7*   CBC:  Recent Labs Lab 05/25/15 1712 05/26/15 0518 05/29/15 0215  WBC 8.9 9.1 7.2  NEUTROABS  --  7.5  --   HGB 9.1* 8.9* 7.8*  HCT 30.5* 29.4* 26.2*  MCV 73.7* 73.5* 72.2*  PLT 289 278 296   Cardiac Enzymes:  Recent Labs Lab 05/26/15 0735  TROPONINI <0.03   CBG:  Recent Labs Lab 05/28/15 1138 05/28/15 1657 05/28/15 1802 05/28/15 2140 05/29/15 0609  GLUCAP 168* 171* 186* 95 116*    No results  found for this or any previous visit (from the past 240 hour(s)).   Scheduled Meds: . amLODipine  10 mg Oral Daily  . aspirin  325 mg Oral Daily  . atorvastatin  20 mg Oral q1800  . calcitRIOL  1 mcg Oral Daily  . carvedilol  6.25 mg Oral BID WC  . cloNIDine  0.1 mg Oral BID  . docusate sodium  100 mg Oral BID  . enoxaparin (LOVENOX) injection  30 mg Subcutaneous Q24H  . furosemide  100 mg Intravenous 4 times per day  . hydrALAZINE  25 mg Oral BID  . insulin aspart  0-9 Units Subcutaneous TID WC  . insulin detemir  36 Units Subcutaneous Daily  . insulin detemir  38 Units Subcutaneous QHS  . isosorbide mononitrate  30 mg Oral Daily  . loratadine  10 mg  Oral Daily  . pantoprazole  40 mg Oral Daily  . polyethylene glycol  17 g Oral Daily  . sodium chloride  3 mL Intravenous Q12H   Continuous Infusions:    Bonnielee Haff   [facility] Pager [phone]  If 7PM-7AM, please contact night-coverage [email or web address] Password [email or web address] 05/29/2015, 10:01 AM   LOS: 4 days

## 2015-05-29 NOTE — Progress Notes (Signed)
Assessment:  1 CKD 4/5 2 Volume overload 3 Hypocalcemia/ vita d defic  Plan: Increase diuretic rx further  Subjective: Interval History: Increased UOP per her report  Objective: Vital signs in last 24 hours: Temp:  [99.4 F (37.4 C)-99.6 F (37.6 C)] 99.4 F (37.4 C) (10/21 1216) Pulse Rate:  [95-103] 97 (10/21 1216) Resp:  [18] 18 (10/21 1216) BP: (107-157)/(59-80) 107/59 mmHg (10/21 1216) SpO2:  [99 %-100 %] 99 % (10/21 1216) Weight:  [98.476 kg (217 lb 1.6 oz)] 98.476 kg (217 lb 1.6 oz) (10/21 [id number]) Weight change: 0.68 kg (1 lb 8 oz)  Intake/Output from previous day: 10/20 0701 - 10/21 0700 In: 900 [P.O.:720; IV Piggyback:180] Out: 1150 [Urine:1150] Intake/Output this shift: Total I/O In: 360 [P.O.:360] Out: 200 [Urine:200]  General appearance: alert and cooperative Pleasant Cor RRR Lungs diminished basilar sounds Ext 2-3+ pitting bilat  Lab Results:  Recent Labs  05/29/15 0215  WBC 7.2  HGB 7.8*  HCT 26.2*  PLT 296   BMET:  Recent Labs  05/28/15 0320 05/29/15 0215  NA 137 136  K 4.2 3.9  CL 103 100*  CO2 25 26  GLUCOSE 101* 119*  BUN 28* 28*  CREATININE 3.01* 2.83*  CALCIUM 6.0* 6.3*   No results for input(s): PTH in the last 72 hours. Iron Studies: No results for input(s): IRON, TIBC, TRANSFERRIN, FERRITIN in the last 72 hours. Studies/Results: No results found.  Scheduled: . amLODipine  10 mg Oral Daily  . aspirin  325 mg Oral Daily  . atorvastatin  20 mg Oral q1800  . calcitRIOL  1 mcg Oral Daily  . carvedilol  6.25 mg Oral BID WC  . cloNIDine  0.1 mg Oral BID  . docusate sodium  100 mg Oral BID  . enoxaparin (LOVENOX) injection  30 mg Subcutaneous Q24H  . furosemide  100 mg Intravenous 4 times per day  . hydrALAZINE  25 mg Oral BID  . insulin aspart  0-9 Units Subcutaneous TID WC  . insulin detemir  36 Units Subcutaneous Daily  . insulin detemir  38 Units Subcutaneous QHS  . isosorbide mononitrate  30 mg Oral Daily  .  loratadine  10 mg Oral Daily  . pantoprazole  40 mg Oral Daily  . polyethylene glycol  17 g Oral Daily  . sodium chloride  3 mL Intravenous Q12H    LOS: 4 days   Ameliana Brashear C 05/29/2015,1:48 PM

## 2015-05-29 NOTE — Progress Notes (Signed)
Pt chair alarm went off. Pt want to go to [facility] to urinate. While assisting pt to stand up , pt started to feel weak on her legs and cant support her weight ,. Pt assisted to sit on the edge of the chair. Pt stated " i did not hit my butt".assisted pt then to the [facility] with assistance from another RN. Pt put back to recliner as requested. Chair alarm on.

## 2015-05-30 LAB — GLUCOSE, CAPILLARY
Glucose-Capillary: 67 mg/dL (ref 65–99)
Glucose-Capillary: 113 mg/dL — ABNORMAL HIGH (ref 65–99)
Glucose-Capillary: 131 mg/dL — ABNORMAL HIGH (ref 65–99)
Glucose-Capillary: 141 mg/dL — ABNORMAL HIGH (ref 65–99)
Glucose-Capillary: 109 mg/dL — ABNORMAL HIGH (ref 65–99)

## 2015-05-30 LAB — CBC
HCT: 26 % — ABNORMAL LOW (ref 36.0–46.0)
Hemoglobin: 8 g/dL — ABNORMAL LOW (ref 12.0–15.0)
MCH: 22.3 pg — ABNORMAL LOW (ref 26.0–34.0)
MCHC: 30.8 g/dL (ref 30.0–36.0)
MCV: 72.4 fL — ABNORMAL LOW (ref 78.0–100.0)
Platelets: 296 10*3/uL (ref 150–400)
RBC: 3.59 MIL/uL — ABNORMAL LOW (ref 3.87–5.11)
RDW: 16.7 % — ABNORMAL HIGH (ref 11.5–15.5)
WBC: 7.2 10*3/uL (ref 4.0–10.5)

## 2015-05-30 LAB — BASIC METABOLIC PANEL
Anion gap: 9 (ref 5–15)
BUN: 29 mg/dL — ABNORMAL HIGH (ref 6–20)
CO2: 27 mmol/L (ref 22–32)
Calcium: 6.3 mg/dL — CL (ref 8.9–10.3)
Chloride: 98 mmol/L — ABNORMAL LOW (ref 101–111)
Creatinine, Ser: 2.6 mg/dL — ABNORMAL HIGH (ref 0.44–1.00)
GFR calc Af Amer: 19 mL/min — ABNORMAL LOW (ref >60)
GFR calc non Af Amer: 17 mL/min — ABNORMAL LOW (ref >60)
Glucose, Bld: 97 mg/dL (ref 65–99)
Potassium: 4 mmol/L (ref 3.5–5.1)
Sodium: 134 mmol/L — ABNORMAL LOW (ref 135–145)

## 2015-05-30 NOTE — Progress Notes (Signed)
PROGRESS NOTE  Yina Geddes Thrush [id number] DOB: 13-Nov-1935 DOA: 05/25/2015 PCP: Maximino Greenland, MD  Brief history 79 year old female with a history of diabetes mellitus, CKD stage IV, hyperlipidemia, and anemia of CKD presents with 2 day history of increasing shortness breath, lower extremity edema, and orthopnea. Upon further questioning, the patient stated that she has been short of breath and having worsening lower extremity edema for the past 2-3 weeks. In addition, she has had increasing abdominal girth. The patient had been taking furosemide 80 mg twice a day. However she was having a lot of cramping in her back and legs. As a result, her physician told her to decrease her furosemide to 40 mg twice a day over the past 2 weeks. The patient has a history of obstructive sleep apnea, but she has not been using her CPAP faithfully. In addition, she has been complaining of increasing orthopnea for which she has been sleeping in a recliner for several weeks.   Assessment/Plan: Acute on chronic diastolic CHF/anasarca -Patient is slowly improving. She is responding to high-dose Lasix. Continue current management.  -05/26/2015 EF 60-65%, grade 1 DD -Daily weights. Weight is noted to be decreasing. -I's and O's.  Ins and outs not being charted accurately. Nurse told me that patient has had a few episodes of incontinence.  Acute on CKD stage IV -Baseline creatinine 2.2-2.5. Appreciate nephrology input. Creatinine is slightly better today. Continue to monitor urine output. Remains on high-dose Lasix.  Essential Hypertension -Continue amlodipine,  Hydralazine -Patient was on clonidine at home. It is noted that this medicine had been discontinued earlier during this hospitalization. Would like to avoid rebound hypertension. So Clonidine was resumed at a lower dose. -Patient started on carvedilol and Imdur -Discontinued ARB secondary to worsening renal function  Diabetes mellitus  type 2  -hemoglobin A1c is 7.9 -Continue home dose of Levemir  -CBGs controlled -NovoLog sliding scale  Hypocalcemia vitamin D--28. PTH was recently checked and was elevated at 620. Patient continued on calcitriol. Dose was increased. Calcium level is better.   History of TIA  -Continue aspirin   Hyperlipidemia  -Continue statin   Anemia likely secondary to chronic kidney disease Hemoglobin is stable. No overt bleeding. Monitor for now. No indication for transfusion. Nephrology to determine if she will benefit from Epogen.  DVT prophylaxis: Lovenox CODE STATUS: Full code Family communication: Discussed with the patient Disposition: Continue current treatment. Mobilize.    Procedures/Studies: Dg Chest 2 View  05/25/2015  CLINICAL DATA:  79 year old female with a history of weight gain, hypertension, diabetes EXAM: CHEST - 2 VIEW COMPARISON:  10/14/2003 FINDINGS: Since the prior chest x-ray there has been interval enlargement of the cardiomediastinal silhouette including cardiac diameter. Atherosclerotic calcifications of the aortic arch. Fullness in the bilateral hilar region. Interlobular septal thickening with interstitial opacities. No confluent airspace disease. No pneumothorax. No large pleural effusion. No displaced fracture. Unremarkable appearance of the upper abdomen. IMPRESSION: Evidence of congestive heart failure, with cardiomegaly. Signed, Dulcy Fanny. Earleen Newport, DO [facility] Radiology Electronically Signed   By: Corrie Mckusick D.O.   On: 05/25/2015 17:33      Subjective: Patient feels well this morning. She couldn't sleep well last night. She had some problems with the CPAP machine. Breathing is improving. Swelling in the legs is improving.   Objective: Filed Vitals:   05/29/15 1036 05/29/15 1216 05/29/15 1751 05/30/15 0635  BP: 148/64 107/59 141/59 168/70  Pulse:  97 89 96  Temp:  99.4 F (37.4 C)  99 F (37.2 C)    TempSrc:  Oral  Oral  Resp:  18  20  Height:      Weight:    94.62 kg (208 lb 9.6 oz)  SpO2:  99%  100%    Intake/Output Summary (Last 24 hours) at 05/30/15 0840 Last data filed at 05/30/15 0754  Gross per 24 hour  Intake   1158 ml  Output   3802 ml  Net  -2644 ml   Filed Weights   05/28/15 0520 05/29/15 0613 05/30/15 0635  Weight: 97.796 kg (215 lb 9.6 oz) 98.476 kg (217 lb 1.6 oz) 94.62 kg (208 lb 9.6 oz)    Weight change: -3.856 kg (-8 lb 8 oz)   Exam:   General:  Pt is alert, not in acute distress  Cardiovascular: RRR, S1/S2, no rubs, no gallops + JVD  Respiratory: Bibasilar crackles; no wheezing  Abdomen: Soft/+BS, non tender, non distended, no guarding  Extremities: 2+ LE edema   Data Reviewed: Basic Metabolic Panel:  Recent Labs Lab 05/25/15 2225 05/26/15 0518 05/27/15 0236 05/28/15 0320 05/29/15 0215 05/30/15 0308  NA  --  136 135 137 136 134*  K  --  4.4 4.5 4.2 3.9 4.0  CL  --  103 101 103 100* 98*  CO2  --  25 25 25 26 27   GLUCOSE  --  115* 152* 101* 119* 97  BUN  --  17 23* 28* 28* 29*  CREATININE  --  2.50* 3.00* 3.01* 2.83* 2.60*  CALCIUM  --  6.2* 5.6* 6.0* 6.3* 6.3*  PHOS 1.9*  --   --   --  4.1  --    Liver Function Tests:  Recent Labs Lab 05/25/15 2100 05/26/15 0518 05/27/15 0236 05/29/15 0215  AST 20 20 16   --   ALT 12* 15 11*  --   ALKPHOS 113 112 99  --   BILITOT 0.3 0.5 0.5  --   PROT 7.7 7.5 6.6  --   ALBUMIN 3.2* 3.1* 2.7* 2.7*   CBC:  Recent Labs Lab 05/25/15 1712 05/26/15 0518 05/29/15 0215 05/30/15 0308  WBC 8.9 9.1 7.2 7.2  NEUTROABS  --  7.5  --   --   HGB 9.1* 8.9* 7.8* 8.0*  HCT 30.5* 29.4* 26.2* 26.0*  MCV 73.7* 73.5* 72.2* 72.4*  PLT 289 278 296 296   Cardiac Enzymes:  Recent Labs Lab 05/26/15 0735  TROPONINI <0.03   CBG:  Recent Labs Lab 05/29/15 1127 05/29/15 1647 05/29/15 2155 05/30/15 0601 05/30/15 0746  GLUCAP 185* 209* 81 67 113*    No results found for this or any  previous visit (from the past 240 hour(s)).   Scheduled Meds: . amLODipine  10 mg Oral Daily  . aspirin  325 mg Oral Daily  . atorvastatin  20 mg Oral q1800  . calcitRIOL  1 mcg Oral Daily  . carvedilol  6.25 mg Oral BID WC  . cloNIDine  0.1 mg Oral BID  . docusate sodium  100 mg Oral BID  . enoxaparin (LOVENOX) injection  30 mg Subcutaneous Q24H  . furosemide  160 mg Intravenous 4 times per day  . hydrALAZINE  25 mg Oral BID  . insulin aspart  0-9 Units Subcutaneous TID WC  . insulin detemir  15 Units Subcutaneous QHS  . insulin detemir  36 Units Subcutaneous Daily  . isosorbide mononitrate  30 mg Oral Daily  .  loratadine  10 mg Oral Daily  . pantoprazole  40 mg Oral Daily  . polyethylene glycol  17 g Oral Daily  . sodium chloride  3 mL Intravenous Q12H   Continuous Infusions:    Bonnielee Haff   [facility] Pager [phone]  If 7PM-7AM, please contact night-coverage [email or web address] Password [email or web address] 05/30/2015, 8:40 AM   LOS: 5 days

## 2015-05-30 NOTE — Progress Notes (Signed)
  Assessment:  1 CKD 4/5 2 Volume overload 3 Hypocalcemia/ vita d defic  Plan: Cont diuretics iv     Subjective: Interval History: good UOP  Objective: Vital signs in last 24 hours: Temp:  [99 F (37.2 C)-99.4 F (37.4 C)] 99 F (37.2 C) (10/22 0635) Pulse Rate:  [89-97] 96 (10/22 0635) Resp:  [18-20] 20 (10/22 0635) BP: (107-168)/(59-70) 168/70 mmHg (10/22 0635) SpO2:  [99 %-100 %] 100 % (10/22 0635) Weight:  [94.62 kg (208 lb 9.6 oz)] 94.62 kg (208 lb 9.6 oz) (10/22 0635) Weight change: -3.856 kg (-8 lb 8 oz)  Intake/Output from previous day: 10/21 0701 - 10/22 0700 In: 1158 [P.O.:960; IV Piggyback:198] Out: [id number] [Urine:3575; Stool:2] Intake/Output this shift: Total I/O In: -  Out: 476 [Urine:475; Stool:1]  General appearance: alert and cooperative Resp: diminished breath sounds bilaterally GI: soft, non-tender; bowel sounds normal; no masses,  no organomegaly and obese Extremities: edema 3+ but less  Lab Results:  Recent Labs  05/29/15 0215 05/30/15 0308  WBC 7.2 7.2  HGB 7.8* 8.0*  HCT 26.2* 26.0*  PLT 296 296   BMET:  Recent Labs  05/29/15 0215 05/30/15 0308  NA 136 134*  K 3.9 4.0  CL 100* 98*  CO2 26 27  GLUCOSE 119* 97  BUN 28* 29*  CREATININE 2.83* 2.60*  CALCIUM 6.3* 6.3*   No results for input(s): PTH in the last 72 hours. Iron Studies: No results for input(s): IRON, TIBC, TRANSFERRIN, FERRITIN in the last 72 hours. Studies/Results: No results found.  Scheduled: . amLODipine  10 mg Oral Daily  . aspirin  325 mg Oral Daily  . atorvastatin  20 mg Oral q1800  . calcitRIOL  1 mcg Oral Daily  . carvedilol  6.25 mg Oral BID WC  . cloNIDine  0.1 mg Oral BID  . docusate sodium  100 mg Oral BID  . enoxaparin (LOVENOX) injection  30 mg Subcutaneous Q24H  . furosemide  160 mg Intravenous 4 times per day  . hydrALAZINE  25 mg Oral BID  . insulin aspart  0-9 Units Subcutaneous TID WC  . insulin detemir  15 Units Subcutaneous QHS  .  insulin detemir  36 Units Subcutaneous Daily  . isosorbide mononitrate  30 mg Oral Daily  . loratadine  10 mg Oral Daily  . pantoprazole  40 mg Oral Daily  . polyethylene glycol  17 g Oral Daily  . sodium chloride  3 mL Intravenous Q12H     LOS: 5 days   Zelie Asbill C 05/30/2015,11:05 AM

## 2015-05-31 LAB — BASIC METABOLIC PANEL
Anion gap: 8 (ref 5–15)
BUN: 30 mg/dL — ABNORMAL HIGH (ref 6–20)
CO2: 30 mmol/L (ref 22–32)
Calcium: 6.4 mg/dL — CL (ref 8.9–10.3)
Chloride: 99 mmol/L — ABNORMAL LOW (ref 101–111)
Creatinine, Ser: 2.65 mg/dL — ABNORMAL HIGH (ref 0.44–1.00)
GFR calc Af Amer: 19 mL/min — ABNORMAL LOW (ref >60)
GFR calc non Af Amer: 16 mL/min — ABNORMAL LOW (ref >60)
Glucose, Bld: 92 mg/dL (ref 65–99)
Potassium: 3.7 mmol/L (ref 3.5–5.1)
Sodium: 137 mmol/L (ref 135–145)

## 2015-05-31 LAB — GLUCOSE, CAPILLARY
Glucose-Capillary: 106 mg/dL — ABNORMAL HIGH (ref 65–99)
Glucose-Capillary: 122 mg/dL — ABNORMAL HIGH (ref 65–99)
Glucose-Capillary: 96 mg/dL (ref 65–99)
Glucose-Capillary: 92 mg/dL (ref 65–99)

## 2015-05-31 MED ORDER — SODIUM CHLORIDE 0.9 % IV SOLN
510.0000 mg | Freq: Once | INTRAVENOUS | Status: AC
  Administered 2015-05-31: 510 mg via INTRAVENOUS
  Filled 2015-05-31: qty 17

## 2015-05-31 MED ORDER — POTASSIUM CHLORIDE CRYS ER 20 MEQ PO TBCR
30.0000 meq | EXTENDED_RELEASE_TABLET | Freq: Two times a day (BID) | ORAL | Status: AC
  Administered 2015-05-31 (×2): 30 meq via ORAL
  Filled 2015-05-31 (×4): qty 1

## 2015-05-31 NOTE — Progress Notes (Signed)
Pt refused cpap due to pressure levels not going to 2cmh2o like hers at home.  Rt explained it could be returned if she changed her mind.

## 2015-05-31 NOTE — Progress Notes (Addendum)
Assessment:  1 CKD 4/5 (Followed by Dr. Moshe Cipro) 2 Volume overload, improving 3 Hypocalcemia/ vita d defic 4 Anemia w/ iron def  Plan: Cont diuretics iv k supp IV iron today  Subjective: Interval History: Diuresing  Objective: Vital signs in last 24 hours: Temp:  [97.9 F (36.6 C)-98.7 F (37.1 C)] 98.7 F (37.1 C) (10/23 0505) Pulse Rate:  [86-89] 86 (10/23 0505) Resp:  [18-20] 18 (10/23 0505) BP: (120-164)/(54-83) 164/83 mmHg (10/23 0505) SpO2:  [97 %-100 %] 97 % (10/23 0505) Weight:  [95.074 kg (209 lb 9.6 oz)] 95.074 kg (209 lb 9.6 oz) (10/23 0505) Weight change: 0.454 kg (1 lb)  Intake/Output from previous day: 10/22 0701 - 10/23 0700 In: 1224 [P.O.:960; IV Piggyback:264] Out: [id number] [Urine:1475; Stool:1] Intake/Output this shift:    General appearance: alert and cooperative Resp: clear to auscultation bilaterally and dimished throughout Cardio: regular rate and rhythm, S1, S2 normal, no murmur, click, rub or gallop Extremities: edema 2-3+ improved  Now has foley  Lab Results:  Recent Labs  05/29/15 0215 05/30/15 0308  WBC 7.2 7.2  HGB 7.8* 8.0*  HCT 26.2* 26.0*  PLT 296 296   BMET:  Recent Labs  05/30/15 0308 05/31/15 0308  NA 134* 137  K 4.0 3.7  CL 98* 99*  CO2 27 30  GLUCOSE 97 92  BUN 29* 30*  CREATININE 2.60* 2.65*  CALCIUM 6.3* 6.4*   No results for input(s): PTH in the last 72 hours. Iron Studies: No results for input(s): IRON, TIBC, TRANSFERRIN, FERRITIN in the last 72 hours. Studies/Results: No results found.  Scheduled: . amLODipine  10 mg Oral Daily  . aspirin  325 mg Oral Daily  . atorvastatin  20 mg Oral q1800  . calcitRIOL  1 mcg Oral Daily  . carvedilol  6.25 mg Oral BID WC  . cloNIDine  0.1 mg Oral BID  . docusate sodium  100 mg Oral BID  . enoxaparin (LOVENOX) injection  30 mg Subcutaneous Q24H  . furosemide  160 mg Intravenous 4 times per day  . hydrALAZINE  25 mg Oral BID  . insulin aspart  0-9 Units  Subcutaneous TID WC  . insulin detemir  15 Units Subcutaneous QHS  . insulin detemir  36 Units Subcutaneous Daily  . isosorbide mononitrate  30 mg Oral Daily  . loratadine  10 mg Oral Daily  . pantoprazole  40 mg Oral Daily  . polyethylene glycol  17 g Oral Daily  . sodium chloride  3 mL Intravenous Q12H     LOS: 6 days   Niomie Englert C 05/31/2015,8:55 AM

## 2015-05-31 NOTE — Progress Notes (Signed)
PROGRESS NOTE  [name] [name] [id number] DOB: April 21, 1936 DOA: 05/25/2015 PCP: Maximino Greenland, MD  Brief history 79 year old female with a history of diabetes mellitus, CKD stage IV, hyperlipidemia, and anemia of CKD presents with 2 day history of increasing shortness breath, lower extremity edema, and orthopnea. Upon further questioning, the patient stated that she has been short of breath and having worsening lower extremity edema for the past 2-3 weeks. In addition, she has had increasing abdominal girth. The patient had been taking furosemide 80 mg twice a day. However she was having a lot of cramping in her back and legs. As a result, her physician told her to decrease her furosemide to 40 mg twice a day over the past 2 weeks. The patient has a history of obstructive sleep apnea, but she has not been using her CPAP faithfully. In addition, she has been complaining of increasing orthopnea for which she has been sleeping in a recliner for several weeks.   Assessment/Plan: Acute on chronic diastolic CHF/anasarca -Patient is slowly improving. She is responding to high-dose Lasix. Continue current management.  -05/26/2015 EF 60-65%, grade 1 DD -Daily weights. Weight is noted to be decreasing. -I's and O's. Ins and outs not being charted accurately. Foley catheter was placed last night. Hopefully, we should get better measurements.  Acute on CKD stage IV -Baseline creatinine 2.2-2.5. Appreciate nephrology input. Continue to monitor urine output. Remains on high-dose Lasix.  Essential Hypertension -Continue amlodipine,  Hydralazine -Patient was on clonidine at home. It is noted that this medicine had been discontinued earlier during this hospitalization. Would like to avoid rebound hypertension. So Clonidine was resumed at a lower dose. -Patient also started on carvedilol and Imdur. Blood pressure is improved. -Discontinued ARB secondary to worsening renal  function  Diabetes mellitus type 2  -hemoglobin A1c is 7.9 -Continue home dose of Levemir  -CBGs controlled -NovoLog sliding scale  Hypocalcemia vitamin D--28. PTH was recently checked and was elevated at 620. Patient continued on calcitriol. Dose was increased. Calcium level is better.   History of TIA  -Continue aspirin   Hyperlipidemia  -Continue statin   Anemia likely secondary to chronic kidney disease Hemoglobin is stable. No overt bleeding. Monitor for now. No indication for transfusion. Nephrology to determine if she will benefit from Epogen.  DVT prophylaxis: Lovenox CODE STATUS: Full code Family communication: Discussed with the patient Disposition: Continue current treatment. Mobilize.    Consultants: Nephrology  Procedures/Studies: Dg Chest 2 View  05/25/2015  CLINICAL DATA:  79 year old female with a history of weight gain, hypertension, diabetes EXAM: CHEST - 2 VIEW COMPARISON:  10/14/2003 FINDINGS: Since the prior chest x-ray there has been interval enlargement of the cardiomediastinal silhouette including cardiac diameter. Atherosclerotic calcifications of the aortic arch. Fullness in the bilateral hilar region. Interlobular septal thickening with interstitial opacities. No confluent airspace disease. No pneumothorax. No large pleural effusion. No displaced fracture. Unremarkable appearance of the upper abdomen. IMPRESSION: Evidence of congestive heart failure, with cardiomegaly. Signed, Dulcy Fanny. Earleen Newport, DO [facility] Radiology Electronically Signed   By: Corrie Mckusick D.O.   On: 05/25/2015 17:33      Subjective: Patient feels well this morning. She couldn't sleep well last night. She had some problems with the CPAP machine. Breathing is improving. Swelling in the legs is improving.   Objective: Filed Vitals:   05/30/15 1300 05/30/15 1711 05/30/15 2030 05/31/15 0505  BP: 126/66 120/54 131/62 164/83  Pulse: 89 88 86 86  Temp: 97.9 F (36.6 C)  98.1 F (36.7 C) 98.7 F (37.1 C)  TempSrc: Oral  Oral Oral  Resp: 20 18 18 18   Height:      Weight:    95.074 kg (209 lb 9.6 oz)  SpO2: 100% 100% 97% 97%    Intake/Output Summary (Last 24 hours) at 05/31/15 0813 Last data filed at 05/31/15 0600  Gross per 24 hour  Intake   1224 ml  Output   1251 ml  Net    -27 ml   Filed Weights   05/29/15 0613 05/30/15 0635 05/31/15 0505  Weight: 98.476 kg (217 lb 1.6 oz) 94.62 kg (208 lb 9.6 oz) 95.074 kg (209 lb 9.6 oz)    Weight change: 0.454 kg (1 lb)   Exam:   General:  Pt is alert, not in acute distress  Cardiovascular: RRR, S1/S2, no rubs, no gallops + JVD  Respiratory: Bibasilar crackles; no wheezing  Abdomen: Soft/+BS, non tender, non distended, no guarding  Extremities: 2+ LE edema   Data Reviewed: Basic Metabolic Panel:  Recent Labs Lab 05/25/15 2225  05/27/15 0236 05/28/15 0320 05/29/15 0215 05/30/15 0308 05/31/15 0308  NA  --   < > 135 137 136 134* 137  K  --   < > 4.5 4.2 3.9 4.0 3.7  CL  --   < > 101 103 100* 98* 99*  CO2  --   < > 25 25 26 27 30   GLUCOSE  --   < > 152* 101* 119* 97 92  BUN  --   < > 23* 28* 28* 29* 30*  CREATININE  --   < > 3.00* 3.01* 2.83* 2.60* 2.65*  CALCIUM  --   < > 5.6* 6.0* 6.3* 6.3* 6.4*  PHOS 1.9*  --   --   --  4.1  --   --   < > = values in this interval not displayed. Liver Function Tests:  Recent Labs Lab 05/25/15 2100 05/26/15 0518 05/27/15 0236 05/29/15 0215  AST 20 20 16   --   ALT 12* 15 11*  --   ALKPHOS 113 112 99  --   BILITOT 0.3 0.5 0.5  --   PROT 7.7 7.5 6.6  --   ALBUMIN 3.2* 3.1* 2.7* 2.7*   CBC:  Recent Labs Lab 05/25/15 1712 05/26/15 0518 05/29/15 0215 05/30/15 0308  WBC 8.9 9.1 7.2 7.2  NEUTROABS  --  7.5  --   --   HGB 9.1* 8.9* 7.8* 8.0*  HCT 30.5* 29.4* 26.2* 26.0*  MCV 73.7* 73.5* 72.2* 72.4*  PLT 289 278 296 296   Cardiac Enzymes:  Recent Labs Lab 05/26/15 0735  TROPONINI  <0.03   CBG:  Recent Labs Lab 05/30/15 0746 05/30/15 1221 05/30/15 1637 05/30/15 2117 05/31/15 0651  GLUCAP 113* 131* 141* 109* 106*    No results found for this or any previous visit (from the past 240 hour(s)).   Scheduled Meds: . amLODipine  10 mg Oral Daily  . aspirin  325 mg Oral Daily  . atorvastatin  20 mg Oral q1800  . calcitRIOL  1 mcg Oral Daily  . carvedilol  6.25 mg Oral BID WC  . cloNIDine  0.1 mg Oral BID  . docusate sodium  100 mg Oral BID  . enoxaparin (LOVENOX) injection  30 mg Subcutaneous Q24H  . furosemide  160 mg Intravenous 4 times per day  . hydrALAZINE  25 mg Oral  BID  . insulin aspart  0-9 Units Subcutaneous TID WC  . insulin detemir  15 Units Subcutaneous QHS  . insulin detemir  36 Units Subcutaneous Daily  . isosorbide mononitrate  30 mg Oral Daily  . loratadine  10 mg Oral Daily  . pantoprazole  40 mg Oral Daily  . polyethylene glycol  17 g Oral Daily  . sodium chloride  3 mL Intravenous Q12H   Continuous Infusions:    Bonnielee Haff   [facility] Pager [phone]  If 7PM-7AM, please contact night-coverage [email or web address] Password [email or web address] 05/31/2015, 8:13 AM   LOS: 6 days

## 2015-06-01 LAB — GLUCOSE, CAPILLARY
Glucose-Capillary: 58 mg/dL — ABNORMAL LOW (ref 65–99)
Glucose-Capillary: 83 mg/dL (ref 65–99)
Glucose-Capillary: 132 mg/dL — ABNORMAL HIGH (ref 65–99)
Glucose-Capillary: 118 mg/dL — ABNORMAL HIGH (ref 65–99)
Glucose-Capillary: 114 mg/dL — ABNORMAL HIGH (ref 65–99)

## 2015-06-01 LAB — BASIC METABOLIC PANEL
Anion gap: 10 (ref 5–15)
BUN: 30 mg/dL — ABNORMAL HIGH (ref 6–20)
CO2: 31 mmol/L (ref 22–32)
Calcium: 6.9 mg/dL — ABNORMAL LOW (ref 8.9–10.3)
Chloride: 100 mmol/L — ABNORMAL LOW (ref 101–111)
Creatinine, Ser: 2.54 mg/dL — ABNORMAL HIGH (ref 0.44–1.00)
GFR calc Af Amer: 20 mL/min — ABNORMAL LOW (ref >60)
GFR calc non Af Amer: 17 mL/min — ABNORMAL LOW (ref >60)
Glucose, Bld: 84 mg/dL (ref 65–99)
Potassium: 4.6 mmol/L (ref 3.5–5.1)
Sodium: 141 mmol/L (ref 135–145)

## 2015-06-01 LAB — CBC
HCT: 27 % — ABNORMAL LOW (ref 36.0–46.0)
Hemoglobin: 8.1 g/dL — ABNORMAL LOW (ref 12.0–15.0)
MCH: 21.7 pg — ABNORMAL LOW (ref 26.0–34.0)
MCHC: 30 g/dL (ref 30.0–36.0)
MCV: 72.4 fL — ABNORMAL LOW (ref 78.0–100.0)
Platelets: 339 10*3/uL (ref 150–400)
RBC: 3.73 MIL/uL — ABNORMAL LOW (ref 3.87–5.11)
RDW: 16.7 % — ABNORMAL HIGH (ref 11.5–15.5)
WBC: 4.7 10*3/uL (ref 4.0–10.5)

## 2015-06-01 MED ORDER — CALCIUM CARBONATE 1250 (500 CA) MG PO TABS
1.0000 | ORAL_TABLET | Freq: Two times a day (BID) | ORAL | Status: DC
  Administered 2015-06-01 – 2015-06-03 (×4): 500 mg via ORAL
  Filled 2015-06-01 (×4): qty 1

## 2015-06-01 MED ORDER — FUROSEMIDE 80 MG PO TABS
160.0000 mg | ORAL_TABLET | Freq: Once | ORAL | Status: AC
  Administered 2015-06-01: 160 mg via ORAL
  Filled 2015-06-01: qty 2

## 2015-06-01 NOTE — Progress Notes (Signed)
Physical Therapy Treatment Patient Details Name: [name] [name] MRN: [id number] DOB: Dec 31, 1935 Today's Date: 06/01/2015    History of Present Illness 79 year old female with a history of diabetes mellitus, CKD stage IV, hyperlipidemia, and anemia of CKD presents with 2 day history of increasing shortness breath, lower extremity edema, and orthopnea.     PT Comments    Pt is getting up today with no O2 and check with pre-gait was 93% sat and after walking was 94% with controlled pulses of pre 77 and post 83.  Pt is very motivated to get home and will be able to maneuver for now without supplemental O2.  HHPT needed to follow up.  Follow Up Recommendations  Home health PT     Equipment Recommendations  3in1 (PT) (rollator and tub bench)    Recommendations for Other Services       Precautions / Restrictions Precautions Precautions: Fall Restrictions Weight Bearing Restrictions: No    Mobility  Bed Mobility               General bed mobility comments: in chair  Transfers Overall transfer level: Needs assistance Equipment used: Rolling walker (2 wheeled) Transfers: Sit to/from [employer] Transfers Sit to Stand: Min guard Stand pivot transfers: Min guard;Supervision          Ambulation/Gait Ambulation/Gait assistance: Min guard Ambulation Distance (Feet): 140 Feet Assistive device: Rolling walker (2 wheeled) Gait Pattern/deviations: Step-through pattern;Decreased stride length;Wide base of support Gait velocity: reduced Gait velocity interpretation: Below normal speed for age/gender General Gait Details: cues for safety at doorway and obstacles as pt catches them with her walker   Stairs            Wheelchair Mobility    Modified Rankin (Stroke Patients Only)       Balance Overall balance assessment: Needs assistance Sitting-balance support: Feet supported Sitting balance-Leahy Scale: Good     Standing balance support:  Bilateral upper extremity supported Standing balance-Leahy Scale: Fair                      Cognition Arousal/Alertness: Awake/alert Behavior During Therapy: WFL for tasks assessed/performed Overall Cognitive Status: Within Functional Limits for tasks assessed                      Exercises      General Comments        Pertinent Vitals/Pain Pain Assessment: No/denies pain    Home Living Family/patient expects to be discharged to:: Private residence Living Arrangements: Spouse/significant other Available Help at Discharge: Family;Available 24 hours/day Type of Home: Apartment Home Access: Level entry   Home Layout: One level Home Equipment: Cane - single point;Grab bars - tub/shower      Prior Function Level of Independence: Independent with assistive device(s)      Comments: Per pt was using cane at times.   PT Goals (current goals can now be found in the care plan section) Acute Rehab PT Goals Patient Stated Goal: home soon Progress towards PT goals: Progressing toward goals    Frequency  Min 3X/week    PT Plan Current plan remains appropriate    Co-evaluation             End of Session Equipment Utilized During Treatment: Gait belt Activity Tolerance: Patient tolerated treatment well;Patient limited by fatigue Patient left: in chair;with call bell/phone within reach;with nursing/sitter in room     Time: [id number] PT Time Calculation (min) (ACUTE ONLY): 26  min  Charges:  $Gait Training: 8-22 mins $Therapeutic Activity: 8-22 mins                    G Codes:      Ramond Dial 06/13/2015, 6:44 PM   Mee Hives, PT MS Acute Rehab Dept. Number: [phone] [id number] and [place] [phone]

## 2015-06-01 NOTE — Progress Notes (Signed)
S:Feels better O:BP 119/67 mmHg  Pulse 84  Temp(Src) 98 F (36.7 C) (Oral)  Resp 20  Ht 5' (1.524 m)  Wt 94.955 kg (209 lb 5.4 oz)  BMI 40.88 kg/m2  SpO2 98%  Intake/Output Summary (Last 24 hours) at 06/01/15 0914 Last data filed at 06/01/15 0905  Gross per 24 hour  Intake    840 ml  Output   3426 ml  Net  -2586 ml   Weight change: -0.119 kg (-4.2 oz) [id number] and alert CVS:RRR Resp:clear Abd:+ BS NTND Ext: tr edema NEURO:CNI Ox3 no asterixis   . amLODipine  10 mg Oral Daily  . aspirin  325 mg Oral Daily  . atorvastatin  20 mg Oral q1800  . calcitRIOL  1 mcg Oral Daily  . carvedilol  6.25 mg Oral BID WC  . cloNIDine  0.1 mg Oral BID  . docusate sodium  100 mg Oral BID  . enoxaparin (LOVENOX) injection  30 mg Subcutaneous Q24H  . furosemide  160 mg Intravenous 4 times per day  . hydrALAZINE  25 mg Oral BID  . insulin aspart  0-9 Units Subcutaneous TID WC  . insulin detemir  15 Units Subcutaneous QHS  . insulin detemir  36 Units Subcutaneous Daily  . isosorbide mononitrate  30 mg Oral Daily  . loratadine  10 mg Oral Daily  . pantoprazole  40 mg Oral Daily  . polyethylene glycol  17 g Oral Daily  . sodium chloride  3 mL Intravenous Q12H   No results found. BMET    Component Value Date/Time   NA 137 05/31/2015 0308   K 3.7 05/31/2015 0308   CL 99* 05/31/2015 0308   CO2 30 05/31/2015 0308   GLUCOSE 92 05/31/2015 0308   BUN 30* 05/31/2015 0308   CREATININE 2.65* 05/31/2015 0308   CALCIUM 6.4* 05/31/2015 0308   CALCIUM 7.0* 05/19/2015 0934   GFRNONAA 16* 05/31/2015 0308   GFRAA 19* 05/31/2015 0308   CBC    Component Value Date/Time   WBC 7.2 05/30/2015 0308   RBC 3.59* 05/30/2015 0308   HGB 8.0* 05/30/2015 0308   HCT 26.0* 05/30/2015 0308   PLT 296 05/30/2015 0308   MCV 72.4* 05/30/2015 0308   MCH 22.3* 05/30/2015 0308   MCHC 30.8 05/30/2015 0308   RDW 16.7* 05/30/2015 0308   LYMPHSABS 0.9 05/26/2015 0518   MONOABS 0.6 05/26/2015 0518   EOSABS 0.1  05/26/2015 0518   BASOSABS 0.0 05/26/2015 0518     Assessment: 1. 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 Volume overload, responding to IV lasix 3. Sec HPTH on calcitriol 4. Hypocalcemia  Plan: 1. Cont IV lasix 2. Daily labs 3. Start PO calcium   [name] [name]

## 2015-06-01 NOTE — Care Management Important Message (Signed)
Important Message  Patient Details  Name: [name] [name] MRN: [id number] Date of Birth: 07/13/1936   Medicare Important Message Given:  Yes-third notification given    Delorse Lek 06/01/2015, 12:17 PM

## 2015-06-01 NOTE — Progress Notes (Signed)
PROGRESS NOTE  [name] [name] [id number] DOB: 02/10/1936 DOA: 05/25/2015 PCP: Maximino Greenland, MD  Brief history 79 year old female with a history of diabetes mellitus, CKD stage IV, hyperlipidemia, and anemia of CKD presents with 2 day history of increasing shortness breath, lower extremity edema, and orthopnea. Upon further questioning, the patient stated that she has been short of breath and having worsening lower extremity edema for the past 2-3 weeks. In addition, she has had increasing abdominal girth. The patient had been taking furosemide 80 mg twice a day. However she was having a lot of cramping in her back and legs. As a result, her physician told her to decrease her furosemide to 40 mg twice a day over the past 2 weeks. The patient has a history of obstructive sleep apnea, but she has not been using her CPAP faithfully. In addition, she has been complaining of increasing orthopnea for which she has been sleeping in a recliner for several weeks.   Assessment/Plan: Acute on chronic diastolic CHF/anasarca -Patient is slowly improving. She is responding to high-dose Lasix. Continue current management. She got transiently confused overnight and pulled out her IV. Alert and oriented this morning. IV to be replaced. -05/26/2015 EF 60-65%, grade 1 DD -Daily weights. Weight is noted to be decreasing. -I's and O's. Ins and outs not being charted accurately. Foley catheter was placed 10/22 due to need for accurate ins and outs..   Acute on CKD stage IV -Baseline creatinine 2.2-2.5. Appreciate nephrology input. Continue to monitor urine output. Remains on high-dose Lasix.  Essential Hypertension -Continue amlodipine,  Hydralazine -Patient was on clonidine at home. It is noted that this medicine had been discontinued earlier during this hospitalization. Would like to avoid rebound hypertension. So Clonidine was resumed at a lower dose. -Patient also started on carvedilol  and Imdur. Blood pressure is improved. -Discontinued ARB secondary to worsening renal function  Diabetes mellitus type 2  -hemoglobin A1c is 7.9 -Continue home dose of Levemir  -CBGs controlled -NovoLog sliding scale  Hypocalcemia vitamin D--28. PTH was recently checked and was elevated at 620. Patient continued on calcitriol. Dose was increased. Calcium level is better.   History of TIA  -Continue aspirin   Hyperlipidemia  -Continue statin   Anemia likely secondary to chronic kidney disease Hemoglobin is stable. No overt bleeding. Monitor for now. No indication for transfusion. Nephrology to determine if she will benefit from Epogen.  DVT prophylaxis: Lovenox CODE STATUS: Full code Family communication: Discussed with the patient Disposition: Continue current treatment. Mobilize.    Consultants: Nephrology  Procedures/Studies: Dg Chest 2 View  05/25/2015  CLINICAL DATA:  79 year old female with a history of weight gain, hypertension, diabetes EXAM: CHEST - 2 VIEW COMPARISON:  10/14/2003 FINDINGS: Since the prior chest x-ray there has been interval enlargement of the cardiomediastinal silhouette including cardiac diameter. Atherosclerotic calcifications of the aortic arch. Fullness in the bilateral hilar region. Interlobular septal thickening with interstitial opacities. No confluent airspace disease. No pneumothorax. No large pleural effusion. No displaced fracture. Unremarkable appearance of the upper abdomen. IMPRESSION: Evidence of congestive heart failure, with cardiomegaly. Signed, Dulcy Fanny. Earleen Newport, DO [facility] Electronically Signed   By: Corrie Mckusick D.O.   On: 05/25/2015 17:33      Subjective: Patient feels well this morning. Denies any chest pain. She doesn't remember that she pulled out her IV overnight. Understands that she needs to continue getting Lasix. Leg Swelling  is improving.  Objective: Filed  Vitals:   06/01/15 0828 06/01/15 1041 06/01/15 1042 06/01/15 1043  BP: 119/67 135/60 135/60 135/60  Pulse: 84     Temp:      TempSrc:      Resp:      Height:      Weight:      SpO2:        Intake/Output Summary (Last 24 hours) at 06/01/15 1140 Last data filed at 06/01/15 0905  Gross per 24 hour  Intake    840 ml  Output   2426 ml  Net  -1586 ml   Filed Weights   05/30/15 0635 05/31/15 0505 06/01/15 0614  Weight: 94.62 kg (208 lb 9.6 oz) 95.074 kg (209 lb 9.6 oz) 94.955 kg (209 lb 5.4 oz)    Weight change: -0.119 kg (-4.2 oz)   Exam:   General:  Pt is alert, not in acute distress  Cardiovascular: S1, S2 is normal, regular. No S3, S4. No rubs, murmurs, or bruit. Pedal edema is present.  Respiratory: Bibasilar crackles; no wheezing. Improving air entry.  Abdomen: Soft/+BS, non tender, non distended, no guarding  Extremities: 2+ LE edema   Data Reviewed: Basic Metabolic Panel:  Recent Labs Lab 05/25/15 2225  05/28/15 0320 05/29/15 0215 05/30/15 0308 05/31/15 0308 06/01/15 0517  NA  --   < > 137 136 134* 137 141  K  --   < > 4.2 3.9 4.0 3.7 4.6  CL  --   < > 103 100* 98* 99* 100*  CO2  --   < > 25 26 27 30 31   GLUCOSE  --   < > 101* 119* 97 92 84  BUN  --   < > 28* 28* 29* 30* 30*  CREATININE  --   < > 3.01* 2.83* 2.60* 2.65* 2.54*  CALCIUM  --   < > 6.0* 6.3* 6.3* 6.4* 6.9*  PHOS 1.9*  --   --  4.1  --   --   --   < > = values in this interval not displayed. Liver Function Tests:  Recent Labs Lab 05/25/15 2100 05/26/15 0518 05/27/15 0236 05/29/15 0215  AST 20 20 16   --   ALT 12* 15 11*  --   ALKPHOS 113 112 99  --   BILITOT 0.3 0.5 0.5  --   PROT 7.7 7.5 6.6  --   ALBUMIN 3.2* 3.1* 2.7* 2.7*   CBC:  Recent Labs Lab 05/25/15 1712 05/26/15 0518 05/29/15 0215 05/30/15 0308 06/01/15 0517  WBC 8.9 9.1 7.2 7.2 4.7  NEUTROABS  --  7.5  --   --   --   HGB 9.1* 8.9* 7.8* 8.0* 8.1*  HCT 30.5* 29.4* 26.2* 26.0* 27.0*  MCV 73.7* 73.5* 72.2*  72.4* 72.4*  PLT 289 278 296 296 339   Cardiac Enzymes:  Recent Labs Lab 05/26/15 0735  TROPONINI <0.03   CBG:  Recent Labs Lab 05/31/15 0651 05/31/15 1216 05/31/15 1613 05/31/15 2135 06/01/15 0634  GLUCAP 106* 122* 96 92 83    No results found for this or any previous visit (from the past 240 hour(s)).   Scheduled Meds: . amLODipine  10 mg Oral Daily  . aspirin  325 mg Oral Daily  . atorvastatin  20 mg Oral q1800  . calcitRIOL  1 mcg Oral Daily  . calcium carbonate  1 tablet Oral BID WC  . carvedilol  6.25 mg Oral BID WC  . cloNIDine  0.1 mg Oral BID  . docusate sodium  100 mg Oral BID  . enoxaparin (LOVENOX) injection  30 mg Subcutaneous Q24H  . furosemide  160 mg Intravenous 4 times per day  . hydrALAZINE  25 mg Oral BID  . insulin aspart  0-9 Units Subcutaneous TID WC  . insulin detemir  15 Units Subcutaneous QHS  . insulin detemir  36 Units Subcutaneous Daily  . isosorbide mononitrate  30 mg Oral Daily  . loratadine  10 mg Oral Daily  . pantoprazole  40 mg Oral Daily  . polyethylene glycol  17 g Oral Daily  . sodium chloride  3 mL Intravenous Q12H   Continuous Infusions:    Bonnielee Haff   [facility] Pager [phone]  If 7PM-7AM, please contact night-coverage [email or web address] Password [email or web address] 06/01/2015, 11:40 AM   LOS: 7 days

## 2015-06-01 NOTE — Evaluation (Signed)
Occupational Therapy Evaluation Patient Details Name: [name] [name] MRN: [id number] DOB: [birth year] Today's Date: 06/01/2015    History of Present Illness 79 year old female with a history of diabetes mellitus, CKD stage IV, hyperlipidemia, and anemia of CKD presents with 2 day history of increasing shortness breath, lower extremity edema, and orthopnea.    Clinical Impression   This 79 yo female admitted with above presents to acute OT with decreased balance and decreased awareness of energy conservation strategies that will be of benefit to her for her to get back to her PLOF. She will benefit from one more session of OT on acute care.    Follow Up Recommendations  No OT follow up    Equipment Recommendations  None recommended by OT       Precautions / Restrictions Precautions Precautions: Fall Restrictions Weight Bearing Restrictions: No      Mobility Bed Mobility               General bed mobility comments: pt in chair on arrival  Transfers Overall transfer level: Needs assistance   Transfers: Sit to/from Stand Sit to Stand: Min guard                   ADL Overall ADL's : Needs assistance/impaired Eating/Feeding: Independent;Sitting   Grooming: Supervision/safety;Set up;Sitting   Upper Body Bathing: Supervision/ safety;Set up;Sitting   Lower Body Bathing: Minimal assistance (with S sit<>stand)   Upper Body Dressing : Set up;Supervision/safety;Sitting   Lower Body Dressing: Minimal assistance (with S sit<>stand)   Toilet Transfer: Min guard;Stand-pivot;BSC   Toileting- Clothing Manipulation and Hygiene: Min guard;Sit to/from stand               Vision Additional Comments: No change from baseline          Pertinent Vitals/Pain Pain Assessment: No/denies pain     Hand Dominance Right   Extremity/Trunk Assessment Upper Extremity Assessment Upper Extremity Assessment: Overall WFL for tasks assessed            Communication Communication Communication: No difficulties   Cognition Arousal/Alertness: Awake/alert Behavior During Therapy: WFL for tasks assessed/performed Overall Cognitive Status: Within Functional Limits for tasks assessed                                Home Living Family/patient expects to be discharged to:: Private residence Living Arrangements: Spouse/significant other Available Help at Discharge: Family;Available 24 hours/day Type of Home: Apartment Home Access: Level entry     Home Layout: One level     Bathroom Shower/Tub: Tub/shower unit;Curtain Shower/tub characteristics: Architectural technologist: Standard     Home Equipment: Cane - single point;Grab bars - tub/shower          Prior Functioning/Environment Level of Independence: Independent with assistive device(s)        Comments: Per pt was using cane at times.    OT Diagnosis: Generalized weakness   OT Problem List: Impaired balance (sitting and/or standing);Decreased activity tolerance   OT Treatment/Interventions: Self-care/ADL training;Patient/family education;Energy conservation;Balance training;DME and/or AE instruction;Therapeutic activities    OT Goals(Current goals can be found in the care plan section) Acute Rehab OT Goals Patient Stated Goal: home soon OT Goal Formulation: With patient Time For Goal Achievement: 06/08/15 Potential to Achieve Goals: Good  OT Frequency: Min 2X/week              End of Session Nurse Communication:  (left pt on RA  since O2 sats 96%--RN reported this was fine)  Activity Tolerance: Patient tolerated treatment well Patient left: in chair;with chair alarm set   Time: [id number] OT Time Calculation (min): 23 min Charges:  OT General Charges $OT Visit: 1 Procedure OT Evaluation $Initial OT Evaluation Tier I: 1 Procedure OT Treatments $Self Care/Home Management : 8-22 mins  Almon Register [id number] 06/01/2015, 3:39 PM

## 2015-06-01 NOTE — Procedures (Signed)
Pt refuses the use of CPAP.  

## 2015-06-01 NOTE — Progress Notes (Signed)
Pt. Has IV Lasix infusion at this time. No IV access. IV team to be contacted for IV access. Felise Georgia, Katherine Roan

## 2015-06-02 LAB — BASIC METABOLIC PANEL
Anion gap: 12 (ref 5–15)
BUN: 32 mg/dL — ABNORMAL HIGH (ref 6–20)
CO2: 30 mmol/L (ref 22–32)
Calcium: 7.4 mg/dL — ABNORMAL LOW (ref 8.9–10.3)
Chloride: 99 mmol/L — ABNORMAL LOW (ref 101–111)
Creatinine, Ser: 2.58 mg/dL — ABNORMAL HIGH (ref 0.44–1.00)
GFR calc Af Amer: 20 mL/min — ABNORMAL LOW (ref >60)
GFR calc non Af Amer: 17 mL/min — ABNORMAL LOW (ref >60)
Glucose, Bld: 43 mg/dL — CL (ref 65–99)
Potassium: 3.5 mmol/L (ref 3.5–5.1)
Sodium: 141 mmol/L (ref 135–145)

## 2015-06-02 LAB — CBC
HCT: 28.2 % — ABNORMAL LOW (ref 36.0–46.0)
Hemoglobin: 8.5 g/dL — ABNORMAL LOW (ref 12.0–15.0)
MCH: 21.6 pg — ABNORMAL LOW (ref 26.0–34.0)
MCHC: 30.1 g/dL (ref 30.0–36.0)
MCV: 71.8 fL — ABNORMAL LOW (ref 78.0–100.0)
Platelets: 388 10*3/uL (ref 150–400)
RBC: 3.93 MIL/uL (ref 3.87–5.11)
RDW: 16.5 % — ABNORMAL HIGH (ref 11.5–15.5)
WBC: 5.7 10*3/uL (ref 4.0–10.5)

## 2015-06-02 LAB — GLUCOSE, CAPILLARY
Glucose-Capillary: 42 mg/dL — CL (ref 65–99)
Glucose-Capillary: 248 mg/dL — ABNORMAL HIGH (ref 65–99)
Glucose-Capillary: 220 mg/dL — ABNORMAL HIGH (ref 65–99)
Glucose-Capillary: 88 mg/dL (ref 65–99)
Glucose-Capillary: 166 mg/dL — ABNORMAL HIGH (ref 65–99)

## 2015-06-02 MED ORDER — FUROSEMIDE 80 MG PO TABS
160.0000 mg | ORAL_TABLET | Freq: Two times a day (BID) | ORAL | Status: DC
  Administered 2015-06-02 – 2015-06-03 (×3): 160 mg via ORAL
  Filled 2015-06-02 (×3): qty 2

## 2015-06-02 MED ORDER — DARBEPOETIN ALFA 100 MCG/0.5ML IJ SOSY
100.0000 ug | PREFILLED_SYRINGE | INTRAMUSCULAR | Status: DC
Start: 2015-06-02 — End: 2015-06-03
  Administered 2015-06-02: 100 ug via SUBCUTANEOUS
  Filled 2015-06-02: qty 0.5

## 2015-06-02 MED ORDER — POTASSIUM CHLORIDE CRYS ER 20 MEQ PO TBCR
30.0000 meq | EXTENDED_RELEASE_TABLET | Freq: Once | ORAL | Status: AC
  Administered 2015-06-02: 30 meq via ORAL
  Filled 2015-06-02 (×2): qty 1

## 2015-06-02 NOTE — Progress Notes (Signed)
MEDICATION RELATED NOTE  Pharmacy to review home medications with patient  Allergies  Allergen Reactions  . Penicillins Swelling and Rash   Labs:  Recent Labs  05/31/15 0308 06/01/15 0517 06/02/15 0423  WBC  --  4.7 5.7  HGB  --  8.1* 8.5*  HCT  --  27.0* 28.2*  PLT  --  339 388  CREATININE 2.65* 2.54* 2.58*   Estimated Creatinine Clearance: 18.4 mL/min (by C-G formula based on Cr of 2.58).  Medical History: Past Medical History  Diagnosis Date  . Hyperlipidemia   . Hypertension   . TIA (transient ischemic attack) 2015  . CHF (congestive heart failure) ([place])   . Sleep apnea     cpap  . Shortness of breath dyspnea   . Diabetes mellitus without complication (HCC)     insulin dependent  . CKD (chronic kidney disease) stage 3, GFR 30-59 ml/min   . Arthritis     RA    Medications:  Prescriptions prior to admission  Medication Sig Dispense Refill Last Dose  . acetaminophen (TYLENOL) 500 MG tablet Take 1,000 mg by mouth every 8 (eight) hours as needed for mild pain.   05/25/2015 at Unknown time  . amLODipine (NORVASC) 10 MG tablet Take 10 mg by mouth daily. For blood pressure   05/25/2015 at Unknown time  . aspirin 325 MG tablet Take 325 mg by mouth daily. For stroke prevention with history of TIA   05/25/2015 at Unknown time  . atorvastatin (LIPITOR) 20 MG tablet Take 20 mg by mouth daily at 6 PM. For cholesterol  0 05/24/2015 at Unknown time  . calcitRIOL (ROCALTROL) 0.25 MCG capsule Take 0.25 mcg by mouth daily. For low calcium due to kidney disease   05/25/2015 at Unknown time  . cloNIDine (CATAPRES) 0.1 MG tablet Take 0.1 mg by mouth 3 (three) times daily. For blood pressure  0 05/25/2015 at Unknown time  . furosemide (LASIX) 40 MG tablet Take 40 mg by mouth 2 (two) times daily. For fluid removal with congestive heart failure   05/25/2015 at am  . hydrALAZINE (APRESOLINE) 25 MG tablet Take 25 mg by mouth 2 (two) times daily. With breakfast and supper. For blood  pressure  0 05/25/2015 at Unknown time  . Insulin Detemir (LEVEMIR) 100 UNIT/ML Pen Inject 36-38 Units into the skin 2 (two) times daily with a meal. Inject 36 units with breakfast and 38 units with supper. For diabetes   05/25/2015 at Unknown time  . loratadine (CLARITIN) 10 MG tablet Take 10 mg by mouth daily. For allergies  0 05/25/2015 at Unknown time  . olmesartan (BENICAR) 40 MG tablet Take 40 mg by mouth daily. For blood pressure   05/25/2015 at Unknown time  . omeprazole (PRILOSEC) 40 MG capsule Take 40 mg by mouth daily. For reflux   05/25/2015 at Unknown time  . polyethylene glycol (MIRALAX / GLYCOLAX) packet Take 17 g by mouth every hour as needed (constipation).   05/24/2015 at Unknown time  . cephALEXin (KEFLEX) 500 MG capsule Take 1 capsule (500 mg total) by mouth 4 (four) times daily. (Patient not taking: Reported on 11/22/2014) 28 capsule 0 Not Taking at Unknown time    Assessment: 79 y/o female with multiple medical problems. Went to speak with patient and she tells me her daughter takes care of all her medications. When I asked her if she knew about her medications, she again replied her daughter takes care of them. I offered to update her PTA  medication list with indications for each medication and she agreed that would be helpful. I attempted to review her medications but she had people in the room and asked if I could come back when her daughter was here this evening.  Plan:  Indication entered for each medication on PTA medication list Will have pharmacist come by this evening   [facility], Pharm.D., BCPS Clinical Pharmacist Pager: [phone] 06/02/2015 1:35 PM

## 2015-06-02 NOTE — Progress Notes (Signed)
PROGRESS NOTE  [name] [name] [id number] DOB: 1935/11/08 DOA: 05/25/2015 PCP: Maximino Greenland, MD  Brief history 79 year old female with a history of diabetes mellitus, CKD stage IV, hyperlipidemia, and anemia of CKD presents with 2 day history of increasing shortness breath, lower extremity edema, and orthopnea. Upon further questioning, the patient stated that she has been short of breath and having worsening lower extremity edema for the past 2-3 weeks. In addition, she has had increasing abdominal girth. The patient had been taking furosemide 80 mg twice a day. However she was having a lot of cramping in her back and legs. As a result, her physician told her to decrease her furosemide to 40 mg twice a day over the past 2 weeks. The patient has a history of obstructive sleep apnea, but she has not been using her CPAP faithfully. In addition, she has been complaining of increasing orthopnea for which she has been sleeping in a recliner for several weeks. Patient was started on Lasix. She was seen by nephrology. She is diuresing well.  Assessment/Plan: Acute on chronic diastolic CHF/anasarca -Patient is slowly improving. She is responding to high-dose Lasix. IV access remains a problem. She keeps pulling out her IV lines in the middle of the night. Will be changed over to oral Lasix today.  -05/26/2015 EF 60-65%, grade 1 DD -Daily weights. Weight is noted to be decreasing. She has lost about 17 pounds during this hospitalization. -I's and O's.    Acute on CKD stage IV -Baseline creatinine 2.2-2.5. Nephrology is following. Creatinine is stable.  Essential Hypertension -Continue amlodipine,  Hydralazine -Patient was on clonidine at home. It is noted that this medicine had been discontinued earlier during this hospitalization. Would like to avoid rebound hypertension. So Clonidine was resumed at a lower dose. -Patient also started on carvedilol and Imdur. Blood pressure is  improved. -Discontinued ARB secondary to worsening renal function  Diabetes mellitus type 2  -hemoglobin A1c is 7.9 -Continue home dose of Levemir  -CBGs controlled -NovoLog sliding scale  Hypocalcemia vitamin D--28. PTH was recently checked and was elevated at 620. Patient continued on calcitriol. Dose was increased. Calcium level is better.   History of TIA  -Continue aspirin   Hyperlipidemia  -Continue statin   Anemia likely secondary to chronic kidney disease Hemoglobin is stable. No overt bleeding. Monitor for now. No indication for transfusion. Nephrology to determine if she will benefit from Epogen.  DVT prophylaxis: Lovenox CODE STATUS: Full code Family communication: Discussed with the patient Disposition: Continue current treatment. Mobilize. Anticipate discharge in 1-2 days.   Consultants: Nephrology  Procedures/Studies: Dg Chest 2 View  05/25/2015  CLINICAL DATA:  79 year old female with a history of weight gain, hypertension, diabetes EXAM: CHEST - 2 VIEW COMPARISON:  10/14/2003 FINDINGS: Since the prior chest x-ray there has been interval enlargement of the cardiomediastinal silhouette including cardiac diameter. Atherosclerotic calcifications of the aortic arch. Fullness in the bilateral hilar region. Interlobular septal thickening with interstitial opacities. No confluent airspace disease. No pneumothorax. No large pleural effusion. No displaced fracture. Unremarkable appearance of the upper abdomen. IMPRESSION: Evidence of congestive heart failure, with cardiomegaly. Signed, [name] [name]. [name] Newport, DO [facility] Electronically Signed   By: [name] [name] D.O.   On: 05/25/2015 17:33      Subjective: Patient feels cold this morning. Denies any chest pain. Breathing is improving. Her leg swelling is improving.   Objective: Filed Vitals:  06/01/15 1235 06/01/15 1535 06/01/15 1950 06/02/15 0706  BP: 114/58   137/57 159/69  Pulse: 87  81 91  Temp:   98.2 F (36.8 C) 98.9 F (37.2 C)  TempSrc:   Oral Oral  Resp: 18  18 18   Height:      Weight:    91.237 kg (201 lb 2.2 oz)  SpO2: 100% 96% 94% 95%    Intake/Output Summary (Last 24 hours) at 06/02/15 0751 Last data filed at 06/02/15 0717  Gross per 24 hour  Intake    720 ml  Output   2650 ml  Net  -1930 ml   Filed Weights   05/31/15 0505 06/01/15 0614 06/02/15 0706  Weight: 95.074 kg (209 lb 9.6 oz) 94.955 kg (209 lb 5.4 oz) 91.237 kg (201 lb 2.2 oz)    Weight change:    Exam:   General:  Pt is alert, not in acute distress  Cardiovascular: S1, S2 is normal, regular. No S3, S4. No rubs, murmurs, or bruit. Pedal edema is present.  Respiratory: Improved air entry bilaterally. Few crackles at the bases.   Abdomen: Soft/+BS, non tender, non distended, no guarding  Extremities: 2+ LE edema   Data Reviewed: Basic Metabolic Panel:  Recent Labs Lab 05/29/15 0215 05/30/15 0308 05/31/15 0308 06/01/15 0517 06/02/15 0423  NA 136 134* 137 141 141  K 3.9 4.0 3.7 4.6 3.5  CL 100* 98* 99* 100* 99*  CO2 26 27 30 31 30   GLUCOSE 119* 97 92 84 43*  BUN 28* 29* 30* 30* 32*  CREATININE 2.83* 2.60* 2.65* 2.54* 2.58*  CALCIUM 6.3* 6.3* 6.4* 6.9* 7.4*  PHOS 4.1  --   --   --   --    Liver Function Tests:  Recent Labs Lab 05/27/15 0236 05/29/15 0215  AST 16  --   ALT 11*  --   ALKPHOS 99  --   BILITOT 0.5  --   PROT 6.6  --   ALBUMIN 2.7* 2.7*   CBC:  Recent Labs Lab 05/29/15 0215 05/30/15 0308 06/01/15 0517 06/02/15 0423  WBC 7.2 7.2 4.7 5.7  HGB 7.8* 8.0* 8.1* 8.5*  HCT 26.2* 26.0* 27.0* 28.2*  MCV 72.2* 72.4* 72.4* 71.8*  PLT 296 296 339 388   CBG:  Recent Labs Lab 06/01/15 0634 06/01/15 1144 06/01/15 1604 06/01/15 2125 06/02/15 0636  GLUCAP 83 132* 118* 114* 42*    No results found for this or any previous visit (from the past 240 hour(s)).   Scheduled Meds: . amLODipine  10 mg Oral Daily  .  aspirin  325 mg Oral Daily  . atorvastatin  20 mg Oral q1800  . calcitRIOL  1 mcg Oral Daily  . calcium carbonate  1 tablet Oral BID WC  . carvedilol  6.25 mg Oral BID WC  . cloNIDine  0.1 mg Oral BID  . docusate sodium  100 mg Oral BID  . enoxaparin (LOVENOX) injection  30 mg Subcutaneous Q24H  . hydrALAZINE  25 mg Oral BID  . insulin aspart  0-9 Units Subcutaneous TID WC  . insulin detemir  15 Units Subcutaneous QHS  . insulin detemir  36 Units Subcutaneous Daily  . isosorbide mononitrate  30 mg Oral Daily  . loratadine  10 mg Oral Daily  . pantoprazole  40 mg Oral Daily  . polyethylene glycol  17 g Oral Daily  . sodium chloride  3 mL Intravenous Q12H   Continuous Infusions:    Dilyn Osoria,  [facility] Pager [phone]  If 7PM-7AM, please contact night-coverage [email or web address] Password [email or web address] 06/02/2015, 7:51 AM   LOS: 8 days

## 2015-06-02 NOTE — Progress Notes (Signed)
Pt. Has IVPB Lasix scheduled at this time. Pt. With no IV access. Pt. Has pulled IV out multiple times, 6x. Text page sent to on call NP, K. Baltazar Najjar. [name] [name], [name] [name]

## 2015-06-02 NOTE — Progress Notes (Signed)
Inpatient Diabetes Program Recommendations  AACE/ADA: New Consensus Statement on Inpatient Glycemic Control (2015)  Target Ranges:  Prepandial:   less than 140 mg/dL      Peak postprandial:   less than 180 mg/dL (1-2 hours)      Critically ill patients:  140 - 180 mg/dL   Review of Glycemic Control Results for [name] [name], [name] [name] (MRN [id number]) as of 06/02/2015 13:17  Ref. Range 06/01/2015 06:34 06/01/2015 11:44 06/01/2015 16:04 06/01/2015 21:25 06/02/2015 04:23 06/02/2015 06:36 06/02/2015 09:46 06/02/2015 11:28  Glucose-Capillary Latest Ref Range: 65-99 mg/dL 83 132 (H) 118 (H) 114 (H)  42 (LL) 248 (H) 220 (H)    Inpatient Diabetes Program Recommendations: Levemir dosing at 36 units am and 15 units HS. Fasting has been controlled less than 100 mg/dL, but this am hypoglycemia at 42 mg/dL. Then treated for low blood sugar and then elevated into 200's. Recommend while here that in order to prevent hypoglycemia in the am, slightly decreased levemir doses to 30 units in the am and 10 units at HS  Thank you Rosita Kea, RN, MSN, CDE  Diabetes Inpatient Program Office: [phone] Pager: [phone] 8:00 am to 5:00 pm

## 2015-06-02 NOTE — Progress Notes (Signed)
Pt. With critical glucose of 43 this am. Pt. Alert and stable. On call NP, Made aware via text page.

## 2015-06-02 NOTE — Progress Notes (Signed)
S:Feels better. No SOB O:BP 159/69 mmHg  Pulse 91  Temp(Src) 98.9 F (37.2 C) (Oral)  Resp 18  Ht 5' (1.524 m)  Wt 91.237 kg (201 lb 2.2 oz)  BMI 39.28 kg/m2  SpO2 95%  Intake/Output Summary (Last 24 hours) at 06/02/15 0839 Last data filed at 06/02/15 0717  Gross per 24 hour  Intake    720 ml  Output   2650 ml  Net  -1930 ml   Weight change:  [id number] and alert CVS:RRR Resp:clear Abd:+ BS NTND Ext: no edema NEURO:CNI Ox3 no asterixis   . amLODipine  10 mg Oral Daily  . aspirin  325 mg Oral Daily  . atorvastatin  20 mg Oral q1800  . calcitRIOL  1 mcg Oral Daily  . calcium carbonate  1 tablet Oral BID WC  . carvedilol  6.25 mg Oral BID WC  . cloNIDine  0.1 mg Oral BID  . docusate sodium  100 mg Oral BID  . enoxaparin (LOVENOX) injection  30 mg Subcutaneous Q24H  . hydrALAZINE  25 mg Oral BID  . insulin aspart  0-9 Units Subcutaneous TID WC  . insulin detemir  15 Units Subcutaneous QHS  . insulin detemir  36 Units Subcutaneous Daily  . isosorbide mononitrate  30 mg Oral Daily  . loratadine  10 mg Oral Daily  . pantoprazole  40 mg Oral Daily  . polyethylene glycol  17 g Oral Daily  . sodium chloride  3 mL Intravenous Q12H   No results found. BMET    Component Value Date/Time   NA 141 06/02/2015 0423   K 3.5 06/02/2015 0423   CL 99* 06/02/2015 0423   CO2 30 06/02/2015 0423   GLUCOSE 43* 06/02/2015 0423   BUN 32* 06/02/2015 0423   CREATININE 2.58* 06/02/2015 0423   CALCIUM 7.4* 06/02/2015 0423   CALCIUM 7.0* 05/19/2015 0934   GFRNONAA 17* 06/02/2015 0423   GFRAA 20* 06/02/2015 0423   CBC    Component Value Date/Time   WBC 5.7 06/02/2015 0423   RBC 3.93 06/02/2015 0423   HGB 8.5* 06/02/2015 0423   HCT 28.2* 06/02/2015 0423   PLT 388 06/02/2015 0423   MCV 71.8* 06/02/2015 0423   MCH 21.6* 06/02/2015 0423   MCHC 30.1 06/02/2015 0423   RDW 16.5* 06/02/2015 0423   LYMPHSABS 0.9 05/26/2015 0518   MONOABS 0.6 05/26/2015 0518   EOSABS 0.1 05/26/2015 0518    BASOSABS 0.0 05/26/2015 0518     Assessment: 1. 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 Volume overload, responding to IV lasix.  Wt down 10kg if weights are accurate 3. Sec HPTH on calcitriol 4. Hypocalcemia on Ca supps 5. Anemia  Received feraheme 10/23. O outpt procrit 6. Mild hypokalemia  Plan: 1. Change to po lasix 2. Daily labs 3. Resume procrit 4. Replace K    Ramah Langhans T

## 2015-06-03 DIAGNOSIS — D649 Anemia, unspecified: Secondary | ICD-10-CM

## 2015-06-03 DIAGNOSIS — I10 Essential (primary) hypertension: Secondary | ICD-10-CM

## 2015-06-03 DIAGNOSIS — I5033 Acute on chronic diastolic (congestive) heart failure: Secondary | ICD-10-CM

## 2015-06-03 DIAGNOSIS — E1121 Type 2 diabetes mellitus with diabetic nephropathy: Secondary | ICD-10-CM

## 2015-06-03 DIAGNOSIS — R601 Generalized edema: Secondary | ICD-10-CM

## 2015-06-03 LAB — GLUCOSE, CAPILLARY
Glucose-Capillary: 80 mg/dL (ref 65–99)
Glucose-Capillary: 124 mg/dL — ABNORMAL HIGH (ref 65–99)

## 2015-06-03 LAB — BASIC METABOLIC PANEL
Anion gap: 13 (ref 5–15)
BUN: 32 mg/dL — ABNORMAL HIGH (ref 6–20)
CO2: 28 mmol/L (ref 22–32)
Calcium: 7.9 mg/dL — ABNORMAL LOW (ref 8.9–10.3)
Chloride: 97 mmol/L — ABNORMAL LOW (ref 101–111)
Creatinine, Ser: 2.48 mg/dL — ABNORMAL HIGH (ref 0.44–1.00)
GFR calc Af Amer: 20 mL/min — ABNORMAL LOW (ref >60)
GFR calc non Af Amer: 18 mL/min — ABNORMAL LOW (ref >60)
Glucose, Bld: 136 mg/dL — ABNORMAL HIGH (ref 65–99)
Potassium: 4.2 mmol/L (ref 3.5–5.1)
Sodium: 138 mmol/L (ref 135–145)

## 2015-06-03 LAB — CBC
HCT: 26.5 % — ABNORMAL LOW (ref 36.0–46.0)
Hemoglobin: 7.9 g/dL — ABNORMAL LOW (ref 12.0–15.0)
MCH: 21.5 pg — ABNORMAL LOW (ref 26.0–34.0)
MCHC: 29.8 g/dL — ABNORMAL LOW (ref 30.0–36.0)
MCV: 72 fL — ABNORMAL LOW (ref 78.0–100.0)
Platelets: 388 10*3/uL (ref 150–400)
RBC: 3.68 MIL/uL — ABNORMAL LOW (ref 3.87–5.11)
RDW: 16.6 % — ABNORMAL HIGH (ref 11.5–15.5)
WBC: 5.8 10*3/uL (ref 4.0–10.5)

## 2015-06-03 MED ORDER — ISOSORBIDE MONONITRATE ER 30 MG PO TB24: 30.0000 mg | ORAL_TABLET | Freq: Every day | ORAL | Status: DC

## 2015-06-03 MED ORDER — FUROSEMIDE 80 MG PO TABS: 160.0000 mg | ORAL_TABLET | Freq: Two times a day (BID) | ORAL | Status: DC

## 2015-06-03 MED ORDER — INSULIN DETEMIR 100 UNIT/ML FLEXPEN: PEN_INJECTOR | SUBCUTANEOUS | Status: DC

## 2015-06-03 MED ORDER — CARVEDILOL 6.25 MG PO TABS: 6.2500 mg | ORAL_TABLET | Freq: Two times a day (BID) | ORAL | Status: DC

## 2015-06-03 NOTE — Progress Notes (Addendum)
Physician Discharge Summary  [name] [name] [id number] DOB: 1936/03/04 DOA: 05/25/2015  PCP: [name] Greenland, MD  Admit date: 05/25/2015 Discharge date: 06/03/2015  Time spent: 40 minutes  Recommendations for Outpatient Follow-up:  1. Follow-up with Dr. Moshe [name] on 06/23/2015 2 PM. 2. Benicar discontinued secondary to worsening of renal function, please consider to be started as outpatient.  Discharge Diagnoses:  Principal Problem:   Anasarca Active Problems:   Essential hypertension   CHF (congestive heart failure) (HCC)   DM (diabetes mellitus), type 2 with renal complications (HCC)   Chronic anemia   Hypocalcemia   Acute on chronic diastolic CHF (congestive heart failure) ([place])   Type 2 diabetes mellitus with nephropathy ([place])   Discharge Condition: Stable  Diet recommendation: Heart healthy  Filed Weights   06/01/15 0614 06/02/15 0706 06/03/15 0544  Weight: 94.955 kg (209 lb 5.4 oz) 91.237 kg (201 lb 2.2 oz) 90.447 kg (199 lb 6.4 oz)    History of present illness:  [name] [name] is a 79 y.o. female with history of chronic kidney disease stage 3-4 pressors to the ER because of worsening shortness of breath and peripheral edema. Patient states patient's Lasix dose was increased last month and over the last 2 days it was further increased because of patient was having increasing fluid retention. Patient also was getting short of breath. In the ER patient was found to have significant edema of the lower extremities extending up to the abdomen with chest x-ray showing congestion. Patient states she still makes urine. Creatinine is about the baseline. Patient otherwise denies any chest pain nausea vomiting abdominal pain or diarrhea. Patient has been admitted for anasarca with worsening renal function and with prior 2-D echo showing EF of 65-70% with grade 1 diastolic dysfunction.   Hospital Course:   Acute on chronic diastolic CHF/anasarca -Patient is  slowly improving. She is responding to high-dose Lasix. IV access remains a problem.  -Treated with IV Lasix, 1 day prior to discharge switched to oral Lasix. -Continue to have good urine output. According to net output she lost about 12.2 L since admission. -Daily weights also going into the same direction, being documented that she lost at least about 9 kg. -Discharged on 160 mg of Lasix twice a day, patient follow-up with her nephrologist.   Acute on CKD stage IV -Baseline creatinine 2.2-2.5. Nephrology is following. Creatinine is stable, Lasix dose adjusted.  Essential Hypertension -Continue amlodipine, Hydralazine -Patient was on clonidine at home. It is noted that this medicine had been discontinued earlier during this hospitalization. Would like to avoid rebound hypertension. So Clonidine was resumed at a lower dose. -Patient also started on carvedilol and Imdur. Blood pressure is improved. -Discontinued Benicar secondary to worsening renal function  Diabetes mellitus type 2  -hemoglobin A1c is 7.9 -Patient has had episode of hypoglycemia with blood sugar in the 40s. -Levemir insulin dose change to 30 units in a.m. and 15 units p.m. -Patient to follow-up with primary care physician.  Hypocalcemia vitamin D--28. PTH was recently checked and was elevated at 620. Patient continued on calcitriol. Dose was increased. Calcium level is better.   History of TIA  -Continue aspirin   Hyperlipidemia  -Continue statin   Anemia likely secondary to chronic kidney disease Hemoglobin is stable. No overt bleeding. Monitor for now. No indication for transfusion.  Patient will probably need Epogen as outpatient. Needs close follow-up with nephrology.   Procedures:  None  Consultations:  Nephrology  Discharge Exam: Filed Vitals:  06/03/15 0544  BP: 155/71  Pulse: 79  Temp: 98.1 F (36.7 C)  Resp: 20   General: Alert and awake, oriented x3, not in any acute  distress. HEENT: anicteric sclera, pupils reactive to light and accommodation, EOMI CVS: S1-S2 clear, no murmur rubs or gallops Chest: clear to auscultation bilaterally, no wheezing, rales or rhonchi Abdomen: soft nontender, nondistended, normal bowel sounds, no organomegaly Extremities: no cyanosis, clubbing or edema noted bilaterally Neuro: Cranial nerves II-XII intact, no focal neurological deficits  Discharge Instructions   Discharge Instructions    Diet - low sodium heart healthy    Complete by:  As directed      Increase activity slowly    Complete by:  As directed           Current Discharge Medication List    START taking these medications   Details  carvedilol (COREG) 6.25 MG tablet Take 1 tablet (6.25 mg total) by mouth 2 (two) times daily with a meal. Qty: 60 tablet, Refills: 0    isosorbide mononitrate (IMDUR) 30 MG 24 hr tablet Take 1 tablet (30 mg total) by mouth daily. Qty: 30 tablet, Refills: 0      CONTINUE these medications which have CHANGED   Details  furosemide (LASIX) 80 MG tablet Take 2 tablets (160 mg total) by mouth 2 (two) times daily. For fluid removal with congestive heart failure Qty: 120 tablet, Refills: 0    Insulin Detemir (LEVEMIR) 100 UNIT/ML Pen Inject 30 units with breakfast and 15 units before bedtime. Qty: 15 mL, Refills: 11      CONTINUE these medications which have NOT CHANGED   Details  acetaminophen (TYLENOL) 500 MG tablet Take 1,000 mg by mouth every 8 (eight) hours as needed for mild pain.    amLODipine (NORVASC) 10 MG tablet Take 10 mg by mouth daily. For blood pressure    aspirin 325 MG tablet Take 325 mg by mouth daily. For stroke prevention with history of TIA    atorvastatin (LIPITOR) 20 MG tablet Take 20 mg by mouth daily at 6 PM. For cholesterol Refills: 0    calcitRIOL (ROCALTROL) 0.25 MCG capsule Take 0.25 mcg by mouth daily. For low calcium due to kidney disease    cloNIDine (CATAPRES) 0.1 MG tablet Take 0.1 mg  by mouth 3 (three) times daily. For blood pressure Refills: 0    hydrALAZINE (APRESOLINE) 25 MG tablet Take 25 mg by mouth 2 (two) times daily. With breakfast and supper. For blood pressure Refills: 0    loratadine (CLARITIN) 10 MG tablet Take 10 mg by mouth daily. For allergies Refills: 0    omeprazole (PRILOSEC) 40 MG capsule Take 40 mg by mouth daily. For reflux    polyethylene glycol (MIRALAX / GLYCOLAX) packet Take 17 g by mouth every hour as needed (constipation).      STOP taking these medications     olmesartan (BENICAR) 40 MG tablet      cephALEXin (KEFLEX) 500 MG capsule        Allergies  Allergen Reactions  . Penicillins Swelling and Rash   Follow-up Information    Follow up with [facility].   Specialty:  [place]   Why:  They will do your home health care at your home   Contact information:   [place] [id number] [phone]       Follow up with [name] Greenland, MD.   Specialty:  Internal Medicine  Contact information:   [address] [place] [address] [phone]        The results of significant diagnostics from this hospitalization (including imaging, microbiology, ancillary and laboratory) are listed below for reference.    Significant Diagnostic Studies: Dg Chest 2 View  05/25/2015  CLINICAL DATA:  79 year old female with a history of weight gain, hypertension, diabetes EXAM: CHEST - 2 VIEW COMPARISON:  10/14/2003 FINDINGS: Since the prior chest x-ray there has been interval enlargement of the cardiomediastinal silhouette including cardiac diameter. Atherosclerotic calcifications of the aortic arch. Fullness in the bilateral hilar region. Interlobular septal thickening with interstitial opacities. No confluent airspace disease. No pneumothorax. No large pleural effusion. No displaced fracture. Unremarkable appearance of the upper abdomen. IMPRESSION: Evidence of  congestive heart failure, with cardiomegaly. Signed, Dulcy Fanny. Earleen Newport, DO [facility] Electronically Signed   By: Corrie Mckusick D.O.   On: 05/25/2015 17:33    Microbiology: No results found for this or any previous visit (from the past 240 hour(s)).   Labs: Basic Metabolic Panel:  Recent Labs Lab 05/29/15 0215 05/30/15 0308 05/31/15 0308 06/01/15 0517 06/02/15 0423 06/03/15 0250  NA 136 134* 137 141 141 138  K 3.9 4.0 3.7 4.6 3.5 4.2  CL 100* 98* 99* 100* 99* 97*  CO2 26 27 30 31 30 28   GLUCOSE 119* 97 92 84 43* 136*  BUN 28* 29* 30* 30* 32* 32*  CREATININE 2.83* 2.60* 2.65* 2.54* 2.58* 2.48*  CALCIUM 6.3* 6.3* 6.4* 6.9* 7.4* 7.9*  PHOS 4.1  --   --   --   --   --    Liver Function Tests:  Recent Labs Lab 05/29/15 0215  ALBUMIN 2.7*   No results for input(s): LIPASE, AMYLASE in the last 168 hours. No results for input(s): AMMONIA in the last 168 hours. CBC:  Recent Labs Lab 05/29/15 0215 05/30/15 0308 06/01/15 0517 06/02/15 0423 06/03/15 0250  WBC 7.2 7.2 4.7 5.7 5.8  HGB 7.8* 8.0* 8.1* 8.5* 7.9*  HCT 26.2* 26.0* 27.0* 28.2* 26.5*  MCV 72.2* 72.4* 72.4* 71.8* 72.0*  PLT 296 296 339 388 388   Cardiac Enzymes: No results for input(s): CKTOTAL, CKMB, CKMBINDEX, TROPONINI in the last 168 hours. BNP: BNP (last 3 results)  Recent Labs  05/25/15 1838  BNP 195.2*    ProBNP (last 3 results) No results for input(s): PROBNP in the last 8760 hours.  CBG:  Recent Labs Lab 06/02/15 0946 06/02/15 1128 06/02/15 1627 06/02/15 2109 06/03/15 0641  GLUCAP 248* 220* 88 166* 80       Signed:  Sanuel Ladnier A  [facility] 06/03/2015, 10:45 AM

## 2015-06-03 NOTE — Progress Notes (Signed)
CSW spoke to [name] [name], International aid/development worker for [employer]. They do NOT have any vacancies for patient today. Will need to talk to patient/family about SNF option. Lorie Phenix. Pauline Good, [place]

## 2015-06-03 NOTE — Progress Notes (Signed)
Physical Therapy Treatment Patient Details Name: Berklee Charvat MRN: [id number] DOB: [birth year] Today's Date: 06/03/2015    History of Present Illness 79 year old female with a history of diabetes mellitus, CKD stage IV, hyperlipidemia, and anemia of CKD presents with 2 day history of increasing shortness breath, lower extremity edema, and orthopnea.     PT Comments    Patient is mobilizing very well today, tolerated increased distance >350 ft with ease, no SOB or DOE noted. Patient with improved stability and able to mobilize in room without assistive device. At this time anticipate patient will be safe for d/c home. Reinforced energy conservation with patient.    Follow Up Recommendations  No PT follow up     Equipment Recommendations  3in1 (PT) (rollator and tub bench)    Recommendations for Other Services       Precautions / Restrictions Precautions Precautions: Fall Restrictions Weight Bearing Restrictions: No    Mobility  Bed Mobility               General bed mobility comments: in chair  Transfers Overall transfer level: Modified independent Equipment used: Rolling walker (2 wheeled)                Ambulation/Gait Ambulation/Gait assistance: Modified independent (Device/Increase time) Ambulation Distance (Feet): 360 Feet (20 ft without device) Assistive device: Rolling walker (2 wheeled);None Gait Pattern/deviations: Step-through pattern;Decreased stride length;Wide base of support Gait velocity: improved   General Gait Details: patient ambulating well on room air, no SOB noted no need for rest breaks. Much improved compared to evaluation   Stairs            Wheelchair Mobility    Modified Rankin (Stroke Patients Only)       Balance   Sitting-balance support: Feet supported Sitting balance-Leahy Scale: Good     Standing balance support: Bilateral upper extremity supported Standing balance-Leahy Scale: Good                      Cognition Arousal/Alertness: Awake/alert Behavior During Therapy: WFL for tasks assessed/performed Overall Cognitive Status: Within Functional Limits for tasks assessed                      Exercises      General Comments        Pertinent Vitals/Pain Pain Assessment: No/denies pain    Home Living                      Prior Function            PT Goals (current goals can now be found in the care plan section) Acute Rehab PT Goals Patient Stated Goal: home soon PT Goal Formulation: With patient Time For Goal Achievement: 06/10/15 Potential to Achieve Goals: Good Progress towards PT goals: Progressing toward goals    Frequency  Min 3X/week    PT Plan Current plan remains appropriate    Co-evaluation             End of Session Equipment Utilized During Treatment: Gait belt Activity Tolerance: Patient tolerated treatment well;Patient limited by fatigue Patient left: in chair;with call bell/phone within reach;with nursing/sitter in room     Time: [id number] PT Time Calculation (min) (ACUTE ONLY): 20 min  Charges:  $Gait Training: 8-22 mins                    G Codes:      Indy Prestwood,  Marlene Pfluger J 06/03/2015, 9:40 AM Alben Deeds, PT DPT  [phone]

## 2015-06-03 NOTE — Progress Notes (Signed)
Occupational Therapy Treatment Patient Details Name: [name] [name] MRN: [id number] DOB: 1935/12/17 Today's Date: 06/03/2015    History of present illness 79 year old female with a history of diabetes mellitus, CKD stage IV, hyperlipidemia, and anemia of CKD presents with 2 day history of increasing shortness breath, lower extremity edema, and orthopnea.    OT comments  This 79 yo female admitted with above presents to acute OT with education completed on energy conservation and pt reports that she was up earlier with PT and felt she was walking fine--she does not have any further concerns about basic ADLs prior to D/C today, we wil D/C from acute OT.           Precautions / Restrictions Precautions Precautions: Fall Restrictions Weight Bearing Restrictions: No       Mobility Bed Mobility               General bed mobility comments: in chair           ADL                                         General ADL Comments: Pt reports that her family gave her a rollator this past weekend that she is eager to use. Educated pt on energy conservation stratgies and issued her a handout with some of the item highlighted that may be of the greatest benefit to her                Cognition   Behavior During Therapy: [facility] for tasks assessed/performed Overall Cognitive Status: Within Functional Limits for tasks assessed                                    Pertinent Vitals/ Pain       Pain Assessment: No/denies pain            Progress Toward Goals  OT Goals(current goals can now be found in the care plan section)  Progress towards OT goals: Progressing toward goals            End of Session     Activity Tolerance Patient tolerated treatment well   Patient Left in chair;with call bell/phone within reach;with nursing/sitter in room           Time: 1242-1252 OT Time Calculation (min): 10 min  Charges: OT General  Charges $OT Visit: 1 Procedure OT Treatments $Self Care/Home Management : 8-22 mins  Almon Register [phone] 06/03/2015, 1:42 PM

## 2015-06-03 NOTE — Progress Notes (Signed)
Orders received for pt discharge.  Discharge summary printed and reviewed with pt.  Explained medication regimen, and pt had no further questions at this time.  IV removed and site remains clean, dry, intact.  Telemetry removed.  Pt in stable condition and awaiting transport. 

## 2015-06-03 NOTE — Progress Notes (Signed)
S:Feels better. No SOB.  Wants to go home O:BP 155/71 mmHg  Pulse 79  Temp(Src) 98.1 F (36.7 C) (Oral)  Resp 20  Ht 5' (1.524 m)  Wt 90.447 kg (199 lb 6.4 oz)  BMI 38.94 kg/m2  SpO2 97%  Intake/Output Summary (Last 24 hours) at 06/03/15 1030 Last data filed at 06/03/15 0932  Gross per 24 hour  Intake    720 ml  Output   1675 ml  Net   -955 ml   Weight change:  [id number] and alert CVS:RRR Resp:clear Abd:+ BS NTND Ext: no edema NEURO:CNI Ox3 no asterixis   . amLODipine  10 mg Oral Daily  . aspirin  325 mg Oral Daily  . atorvastatin  20 mg Oral q1800  . calcitRIOL  1 mcg Oral Daily  . calcium carbonate  1 tablet Oral BID WC  . carvedilol  6.25 mg Oral BID WC  . cloNIDine  0.1 mg Oral BID  . Darbepoetin Alfa  100 mcg Subcutaneous Q7 days  . docusate sodium  100 mg Oral BID  . enoxaparin (LOVENOX) injection  30 mg Subcutaneous Q24H  . furosemide  160 mg Oral BID  . hydrALAZINE  25 mg Oral BID  . insulin aspart  0-9 Units Subcutaneous TID WC  . insulin detemir  15 Units Subcutaneous QHS  . insulin detemir  36 Units Subcutaneous Daily  . isosorbide mononitrate  30 mg Oral Daily  . loratadine  10 mg Oral Daily  . pantoprazole  40 mg Oral Daily  . polyethylene glycol  17 g Oral Daily  . sodium chloride  3 mL Intravenous Q12H   No results found. BMET    Component Value Date/Time   NA 138 06/03/2015 0250   K 4.2 06/03/2015 0250   CL 97* 06/03/2015 0250   CO2 28 06/03/2015 0250   GLUCOSE 136* 06/03/2015 0250   BUN 32* 06/03/2015 0250   CREATININE 2.48* 06/03/2015 0250   CALCIUM 7.9* 06/03/2015 0250   CALCIUM 7.0* 05/19/2015 0934   GFRNONAA 18* 06/03/2015 0250   GFRAA 20* 06/03/2015 0250   CBC    Component Value Date/Time   WBC 5.8 06/03/2015 0250   RBC 3.68* 06/03/2015 0250   HGB 7.9* 06/03/2015 0250   HCT 26.5* 06/03/2015 0250   PLT 388 06/03/2015 0250   MCV 72.0* 06/03/2015 0250   MCH 21.5* 06/03/2015 0250   MCHC 29.8* 06/03/2015 0250   RDW 16.6*  06/03/2015 0250   LYMPHSABS 0.9 05/26/2015 0518   MONOABS 0.6 05/26/2015 0518   EOSABS 0.1 05/26/2015 0518   BASOSABS 0.0 05/26/2015 0518     Assessment: 1. 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 Volume overload, much improved 3. Sec HPTH on calcitriol 4. Hypocalcemia on Ca supps 5. Anemia  Received feraheme 10/23. On outpt procrit 6. Mild hypokalemia, improved  Plan: 1. Cont lasix 160mg  BID.  She can go from my standpoint.  She has FU appt with Dr Moshe Cipro 06/23/15 at 2pm.  I discussed with pt that the lasix may need to be adjusted based on fluid status and if she notices edema she could increase it to 160mg  TID or call the office.   [name] [name]

## 2015-06-07 NOTE — Discharge Summary (Signed)
Physician Discharge Summary  [name] [name] [id number] DOB: [birth year] DOA: 05/25/2015  PCP: Maximino Greenland, MD  Admit date: 05/25/2015 Discharge date: 06/03/2015  Time spent: 40 minutes  Recommendations for Outpatient Follow-up:  1. Follow-up with Dr. Moshe Cipro on 06/23/2015 2 PM. 2. Benicar discontinued secondary to worsening of renal function, please consider to be started as outpatient.  Discharge Diagnoses:  Principal Problem:   Anasarca Active Problems:   Essential hypertension   CHF (congestive heart failure) (HCC)   DM (diabetes mellitus), type 2 with renal complications (HCC)   Chronic anemia   Hypocalcemia   Acute on chronic diastolic CHF (congestive heart failure) ([place])   Type 2 diabetes mellitus with nephropathy ([place])   Discharge Condition: Stable  Diet recommendation: Heart healthy  Filed Weights   06/01/15 0614 06/02/15 0706 06/03/15 0544  Weight: 94.955 kg (209 lb 5.4 oz) 91.237 kg (201 lb 2.2 oz) 90.447 kg (199 lb 6.4 oz)    History of present illness:  [name] [name] is a 79 y.o. female with history of chronic kidney disease stage 3-4 pressors to the ER because of worsening shortness of breath and peripheral edema. Patient states patient's Lasix dose was increased last month and over the last 2 days it was further increased because of patient was having increasing fluid retention. Patient also was getting short of breath. In the ER patient was found to have significant edema of the lower extremities extending up to the abdomen with chest x-ray showing congestion. Patient states she still makes urine. Creatinine is about the baseline. Patient otherwise denies any chest pain nausea vomiting abdominal pain or diarrhea. Patient has been admitted for anasarca with worsening renal function and with prior 2-D echo showing EF of 65-70% with grade 1 diastolic dysfunction.   Hospital Course:   Acute on chronic diastolic CHF/anasarca -Patient is  slowly improving. She is responding to high-dose Lasix. IV access remains a problem.  -Treated with IV Lasix, 1 day prior to discharge switched to oral Lasix. -Continue to have good urine output. According to net output she lost about 12.2 L since admission. -Daily weights also going into the same direction, being documented that she lost at least about 9 kg. -Discharged on 160 mg of Lasix twice a day, patient follow-up with her nephrologist.   Acute on CKD stage IV -Baseline creatinine 2.2-2.5. Nephrology is following. Creatinine is stable, Lasix dose adjusted.  Essential Hypertension -Continue amlodipine, Hydralazine -Patient was on clonidine at home. It is noted that this medicine had been discontinued earlier during this hospitalization. Would like to avoid rebound hypertension. So Clonidine was resumed at a lower dose. -Patient also started on carvedilol and Imdur. Blood pressure is improved. -Discontinued Benicar secondary to worsening renal function  Diabetes mellitus type 2  -hemoglobin A1c is 7.9 -Patient has had episode of hypoglycemia with blood sugar in the 40s. -Levemir insulin dose change to 30 units in a.m. and 15 units p.m. -Patient to follow-up with primary care physician.  Hypocalcemia vitamin D--28. PTH was recently checked and was elevated at 620. Patient continued on calcitriol. Dose was increased. Calcium level is better.   History of TIA  -Continue aspirin   Hyperlipidemia  -Continue statin   Anemia likely secondary to chronic kidney disease Hemoglobin is stable. No overt bleeding. Monitor for now. No indication for transfusion.  Patient will probably need Epogen as outpatient. Needs close follow-up with nephrology.   Procedures:  None  Consultations:  Nephrology  Discharge Exam: Filed Vitals:  06/03/15 1217  BP: 143/79  Pulse: 83  Temp: 98.5 F (36.9 C)  Resp: 20   General: Alert and awake, oriented x3, not in any acute  distress. HEENT: anicteric sclera, pupils reactive to light and accommodation, EOMI CVS: S1-S2 clear, no murmur rubs or gallops Chest: clear to auscultation bilaterally, no wheezing, rales or rhonchi Abdomen: soft nontender, nondistended, normal bowel sounds, no organomegaly Extremities: no cyanosis, clubbing or edema noted bilaterally Neuro: Cranial nerves II-XII intact, no focal neurological deficits  Discharge Instructions   Discharge Instructions    Diet - low sodium heart healthy    Complete by:  As directed      Increase activity slowly    Complete by:  As directed           Discharge Medication List as of 06/03/2015 12:45 PM    START taking these medications   Details  carvedilol (COREG) 6.25 MG tablet Take 1 tablet (6.25 mg total) by mouth 2 (two) times daily with a meal., Starting 06/03/2015, Until Discontinued, Print    isosorbide mononitrate (IMDUR) 30 MG 24 hr tablet Take 1 tablet (30 mg total) by mouth daily., Starting 06/03/2015, Until Discontinued, Normal      CONTINUE these medications which have CHANGED   Details  furosemide (LASIX) 80 MG tablet Take 2 tablets (160 mg total) by mouth 2 (two) times daily. For fluid removal with congestive heart failure, Starting 06/03/2015, Until Discontinued, Print    Insulin Detemir (LEVEMIR) 100 UNIT/ML Pen Inject 30 units with breakfast and 15 units before bedtime., Normal      CONTINUE these medications which have NOT CHANGED   Details  acetaminophen (TYLENOL) 500 MG tablet Take 1,000 mg by mouth every 8 (eight) hours as needed for mild pain., Until Discontinued, Historical Med    amLODipine (NORVASC) 10 MG tablet Take 10 mg by mouth daily. For blood pressure, Until Discontinued, Historical Med    aspirin 325 MG tablet Take 325 mg by mouth daily. For stroke prevention with history of TIA, Until Discontinued, Historical Med    atorvastatin (LIPITOR) 20 MG tablet Take 20 mg by mouth daily at 6 PM. For cholesterol,  Starting 06/03/2014, Until Discontinued, Historical Med    calcitRIOL (ROCALTROL) 0.25 MCG capsule Take 0.25 mcg by mouth daily. For low calcium due to kidney disease, Starting 06/26/2012, Until Discontinued, Historical Med    cloNIDine (CATAPRES) 0.1 MG tablet Take 0.1 mg by mouth 3 (three) times daily. For blood pressure, Starting 05/30/2014, Until Discontinued, Historical Med    hydrALAZINE (APRESOLINE) 25 MG tablet Take 25 mg by mouth 2 (two) times daily. With breakfast and supper. For blood pressure, Starting 05/30/2014, Until Discontinued, Historical Med    loratadine (CLARITIN) 10 MG tablet Take 10 mg by mouth daily. For allergies, Starting 05/19/2014, Until Discontinued, Historical Med    omeprazole (PRILOSEC) 40 MG capsule Take 40 mg by mouth daily. For reflux, Until Discontinued, Historical Med    polyethylene glycol (MIRALAX / GLYCOLAX) packet Take 17 g by mouth every hour as needed (constipation)., Until Discontinued, Historical Med      STOP taking these medications     olmesartan (BENICAR) 40 MG tablet      cephALEXin (KEFLEX) 500 MG capsule        Allergies  Allergen Reactions  . Penicillins Swelling and Rash   Follow-up Information    Follow up with [facility].   Specialty:  Home Health Services   Why:  They  will do your home health care at your home   Contact information:   [place] [employer] [address] [phone]       Follow up with Maximino Greenland, MD On 06/09/2015.   Specialty:  Internal Medicine   Why:  @ 4;00PM   Contact information:   [address] [place]  [address] [phone]        The results of significant diagnostics from this hospitalization (including imaging, microbiology, ancillary and laboratory) are listed below for reference.    Significant Diagnostic Studies: Dg Chest 2 View  05/25/2015  CLINICAL DATA:  79 year old female with a history of weight gain, hypertension,  diabetes EXAM: CHEST - 2 VIEW COMPARISON:  10/14/2003 FINDINGS: Since the prior chest x-ray there has been interval enlargement of the cardiomediastinal silhouette including cardiac diameter. Atherosclerotic calcifications of the aortic arch. Fullness in the bilateral hilar region. Interlobular septal thickening with interstitial opacities. No confluent airspace disease. No pneumothorax. No large pleural effusion. No displaced fracture. Unremarkable appearance of the upper abdomen. IMPRESSION: Evidence of congestive heart failure, with cardiomegaly. Signed, Dulcy Fanny. Earleen Newport, DO [facility] Electronically Signed   By: Corrie Mckusick D.O.   On: 05/25/2015 17:33    Microbiology: No results found for this or any previous visit (from the past 240 hour(s)).   Labs: Basic Metabolic Panel:  Recent Labs Lab 06/01/15 0517 06/02/15 0423 06/03/15 0250  NA 141 141 138  K 4.6 3.5 4.2  CL 100* 99* 97*  CO2 31 30 28   GLUCOSE 84 43* 136*  BUN 30* 32* 32*  CREATININE 2.54* 2.58* 2.48*  CALCIUM 6.9* 7.4* 7.9*   Liver Function Tests: No results for input(s): AST, ALT, ALKPHOS, BILITOT, PROT, ALBUMIN in the last 168 hours. No results for input(s): LIPASE, AMYLASE in the last 168 hours. No results for input(s): AMMONIA in the last 168 hours. CBC:  Recent Labs Lab 06/01/15 0517 06/02/15 0423 06/03/15 0250  WBC 4.7 5.7 5.8  HGB 8.1* 8.5* 7.9*  HCT 27.0* 28.2* 26.5*  MCV 72.4* 71.8* 72.0*  PLT 339 388 388   Cardiac Enzymes: No results for input(s): CKTOTAL, CKMB, CKMBINDEX, TROPONINI in the last 168 hours. BNP: BNP (last 3 results)  Recent Labs  05/25/15 1838  BNP 195.2*    ProBNP (last 3 results) No results for input(s): PROBNP in the last 8760 hours.  CBG:  Recent Labs Lab 06/02/15 1128 06/02/15 1627 06/02/15 2109 06/03/15 0641 06/03/15 1117  GLUCAP 220* 88 166* 80 124*       Signed:  Anna Beaird A  [facility] 06/07/2015, 3:33 PM

## 2015-06-09 DIAGNOSIS — I5033 Acute on chronic diastolic (congestive) heart failure: Secondary | ICD-10-CM | POA: Diagnosis not present

## 2015-06-09 DIAGNOSIS — Z9181 History of falling: Secondary | ICD-10-CM | POA: Diagnosis not present

## 2015-06-09 DIAGNOSIS — I5032 Chronic diastolic (congestive) heart failure: Secondary | ICD-10-CM | POA: Diagnosis not present

## 2015-06-09 DIAGNOSIS — I130 Hypertensive heart and chronic kidney disease with heart failure and stage 1 through stage 4 chronic kidney disease, or unspecified chronic kidney disease: Secondary | ICD-10-CM | POA: Diagnosis not present

## 2015-06-09 DIAGNOSIS — Z7982 Long term (current) use of aspirin: Secondary | ICD-10-CM | POA: Diagnosis not present

## 2015-06-09 DIAGNOSIS — D631 Anemia in chronic kidney disease: Secondary | ICD-10-CM | POA: Diagnosis not present

## 2015-06-09 DIAGNOSIS — Z8673 Personal history of transient ischemic attack (TIA), and cerebral infarction without residual deficits: Secondary | ICD-10-CM | POA: Diagnosis not present

## 2015-06-09 DIAGNOSIS — Z794 Long term (current) use of insulin: Secondary | ICD-10-CM | POA: Diagnosis not present

## 2015-06-09 DIAGNOSIS — I509 Heart failure, unspecified: Secondary | ICD-10-CM | POA: Diagnosis not present

## 2015-06-09 DIAGNOSIS — M6281 Muscle weakness (generalized): Secondary | ICD-10-CM | POA: Diagnosis not present

## 2015-06-09 DIAGNOSIS — N184 Chronic kidney disease, stage 4 (severe): Secondary | ICD-10-CM | POA: Diagnosis not present

## 2015-06-09 DIAGNOSIS — E1122 Type 2 diabetes mellitus with diabetic chronic kidney disease: Secondary | ICD-10-CM | POA: Diagnosis not present

## 2015-06-09 DIAGNOSIS — E785 Hyperlipidemia, unspecified: Secondary | ICD-10-CM | POA: Diagnosis not present

## 2015-06-11 DIAGNOSIS — E1122 Type 2 diabetes mellitus with diabetic chronic kidney disease: Secondary | ICD-10-CM | POA: Diagnosis not present

## 2015-06-11 DIAGNOSIS — I5032 Chronic diastolic (congestive) heart failure: Secondary | ICD-10-CM | POA: Diagnosis not present

## 2015-06-11 DIAGNOSIS — Z794 Long term (current) use of insulin: Secondary | ICD-10-CM | POA: Diagnosis not present

## 2015-06-11 DIAGNOSIS — D631 Anemia in chronic kidney disease: Secondary | ICD-10-CM | POA: Diagnosis not present

## 2015-06-11 DIAGNOSIS — I130 Hypertensive heart and chronic kidney disease with heart failure and stage 1 through stage 4 chronic kidney disease, or unspecified chronic kidney disease: Secondary | ICD-10-CM | POA: Diagnosis not present

## 2015-06-11 DIAGNOSIS — Z7982 Long term (current) use of aspirin: Secondary | ICD-10-CM | POA: Diagnosis not present

## 2015-06-11 DIAGNOSIS — Z9181 History of falling: Secondary | ICD-10-CM | POA: Diagnosis not present

## 2015-06-11 DIAGNOSIS — E785 Hyperlipidemia, unspecified: Secondary | ICD-10-CM | POA: Diagnosis not present

## 2015-06-11 DIAGNOSIS — Z8673 Personal history of transient ischemic attack (TIA), and cerebral infarction without residual deficits: Secondary | ICD-10-CM | POA: Diagnosis not present

## 2015-06-11 DIAGNOSIS — N184 Chronic kidney disease, stage 4 (severe): Secondary | ICD-10-CM | POA: Diagnosis not present

## 2015-06-11 DIAGNOSIS — M6281 Muscle weakness (generalized): Secondary | ICD-10-CM | POA: Diagnosis not present

## 2015-06-15 DIAGNOSIS — Z7982 Long term (current) use of aspirin: Secondary | ICD-10-CM | POA: Diagnosis not present

## 2015-06-15 DIAGNOSIS — Z9181 History of falling: Secondary | ICD-10-CM | POA: Diagnosis not present

## 2015-06-15 DIAGNOSIS — E785 Hyperlipidemia, unspecified: Secondary | ICD-10-CM | POA: Diagnosis not present

## 2015-06-15 DIAGNOSIS — N184 Chronic kidney disease, stage 4 (severe): Secondary | ICD-10-CM | POA: Diagnosis not present

## 2015-06-15 DIAGNOSIS — E1122 Type 2 diabetes mellitus with diabetic chronic kidney disease: Secondary | ICD-10-CM | POA: Diagnosis not present

## 2015-06-15 DIAGNOSIS — Z794 Long term (current) use of insulin: Secondary | ICD-10-CM | POA: Diagnosis not present

## 2015-06-15 DIAGNOSIS — I130 Hypertensive heart and chronic kidney disease with heart failure and stage 1 through stage 4 chronic kidney disease, or unspecified chronic kidney disease: Secondary | ICD-10-CM | POA: Diagnosis not present

## 2015-06-15 DIAGNOSIS — Z8673 Personal history of transient ischemic attack (TIA), and cerebral infarction without residual deficits: Secondary | ICD-10-CM | POA: Diagnosis not present

## 2015-06-15 DIAGNOSIS — D631 Anemia in chronic kidney disease: Secondary | ICD-10-CM | POA: Diagnosis not present

## 2015-06-15 DIAGNOSIS — M6281 Muscle weakness (generalized): Secondary | ICD-10-CM | POA: Diagnosis not present

## 2015-06-15 DIAGNOSIS — I5032 Chronic diastolic (congestive) heart failure: Secondary | ICD-10-CM | POA: Diagnosis not present

## 2015-06-16 DIAGNOSIS — D631 Anemia in chronic kidney disease: Secondary | ICD-10-CM | POA: Diagnosis not present

## 2015-06-16 DIAGNOSIS — M6281 Muscle weakness (generalized): Secondary | ICD-10-CM | POA: Diagnosis not present

## 2015-06-16 DIAGNOSIS — Z794 Long term (current) use of insulin: Secondary | ICD-10-CM | POA: Diagnosis not present

## 2015-06-16 DIAGNOSIS — I5032 Chronic diastolic (congestive) heart failure: Secondary | ICD-10-CM | POA: Diagnosis not present

## 2015-06-16 DIAGNOSIS — E785 Hyperlipidemia, unspecified: Secondary | ICD-10-CM | POA: Diagnosis not present

## 2015-06-16 DIAGNOSIS — Z9181 History of falling: Secondary | ICD-10-CM | POA: Diagnosis not present

## 2015-06-16 DIAGNOSIS — E1122 Type 2 diabetes mellitus with diabetic chronic kidney disease: Secondary | ICD-10-CM | POA: Diagnosis not present

## 2015-06-16 DIAGNOSIS — I130 Hypertensive heart and chronic kidney disease with heart failure and stage 1 through stage 4 chronic kidney disease, or unspecified chronic kidney disease: Secondary | ICD-10-CM | POA: Diagnosis not present

## 2015-06-16 DIAGNOSIS — Z8673 Personal history of transient ischemic attack (TIA), and cerebral infarction without residual deficits: Secondary | ICD-10-CM | POA: Diagnosis not present

## 2015-06-16 DIAGNOSIS — N184 Chronic kidney disease, stage 4 (severe): Secondary | ICD-10-CM | POA: Diagnosis not present

## 2015-06-16 DIAGNOSIS — Z7982 Long term (current) use of aspirin: Secondary | ICD-10-CM | POA: Diagnosis not present

## 2015-06-18 DIAGNOSIS — D631 Anemia in chronic kidney disease: Secondary | ICD-10-CM | POA: Diagnosis not present

## 2015-06-18 DIAGNOSIS — I5032 Chronic diastolic (congestive) heart failure: Secondary | ICD-10-CM | POA: Diagnosis not present

## 2015-06-18 DIAGNOSIS — M6281 Muscle weakness (generalized): Secondary | ICD-10-CM | POA: Diagnosis not present

## 2015-06-18 DIAGNOSIS — I130 Hypertensive heart and chronic kidney disease with heart failure and stage 1 through stage 4 chronic kidney disease, or unspecified chronic kidney disease: Secondary | ICD-10-CM | POA: Diagnosis not present

## 2015-06-18 DIAGNOSIS — Z9181 History of falling: Secondary | ICD-10-CM | POA: Diagnosis not present

## 2015-06-18 DIAGNOSIS — N184 Chronic kidney disease, stage 4 (severe): Secondary | ICD-10-CM | POA: Diagnosis not present

## 2015-06-18 DIAGNOSIS — Z8673 Personal history of transient ischemic attack (TIA), and cerebral infarction without residual deficits: Secondary | ICD-10-CM | POA: Diagnosis not present

## 2015-06-18 DIAGNOSIS — Z7982 Long term (current) use of aspirin: Secondary | ICD-10-CM | POA: Diagnosis not present

## 2015-06-18 DIAGNOSIS — E785 Hyperlipidemia, unspecified: Secondary | ICD-10-CM | POA: Diagnosis not present

## 2015-06-18 DIAGNOSIS — Z794 Long term (current) use of insulin: Secondary | ICD-10-CM | POA: Diagnosis not present

## 2015-06-18 DIAGNOSIS — E1122 Type 2 diabetes mellitus with diabetic chronic kidney disease: Secondary | ICD-10-CM | POA: Diagnosis not present

## 2015-06-19 DIAGNOSIS — I5032 Chronic diastolic (congestive) heart failure: Secondary | ICD-10-CM | POA: Diagnosis not present

## 2015-06-19 DIAGNOSIS — Z794 Long term (current) use of insulin: Secondary | ICD-10-CM | POA: Diagnosis not present

## 2015-06-19 DIAGNOSIS — E1122 Type 2 diabetes mellitus with diabetic chronic kidney disease: Secondary | ICD-10-CM | POA: Diagnosis not present

## 2015-06-19 DIAGNOSIS — E785 Hyperlipidemia, unspecified: Secondary | ICD-10-CM | POA: Diagnosis not present

## 2015-06-19 DIAGNOSIS — Z9181 History of falling: Secondary | ICD-10-CM | POA: Diagnosis not present

## 2015-06-19 DIAGNOSIS — I130 Hypertensive heart and chronic kidney disease with heart failure and stage 1 through stage 4 chronic kidney disease, or unspecified chronic kidney disease: Secondary | ICD-10-CM | POA: Diagnosis not present

## 2015-06-19 DIAGNOSIS — Z8673 Personal history of transient ischemic attack (TIA), and cerebral infarction without residual deficits: Secondary | ICD-10-CM | POA: Diagnosis not present

## 2015-06-19 DIAGNOSIS — D631 Anemia in chronic kidney disease: Secondary | ICD-10-CM | POA: Diagnosis not present

## 2015-06-19 DIAGNOSIS — N184 Chronic kidney disease, stage 4 (severe): Secondary | ICD-10-CM | POA: Diagnosis not present

## 2015-06-19 DIAGNOSIS — Z7982 Long term (current) use of aspirin: Secondary | ICD-10-CM | POA: Diagnosis not present

## 2015-06-19 DIAGNOSIS — M6281 Muscle weakness (generalized): Secondary | ICD-10-CM | POA: Diagnosis not present

## 2015-06-23 DIAGNOSIS — D631 Anemia in chronic kidney disease: Secondary | ICD-10-CM | POA: Diagnosis not present

## 2015-06-23 DIAGNOSIS — N2581 Secondary hyperparathyroidism of renal origin: Secondary | ICD-10-CM | POA: Diagnosis not present

## 2015-06-23 DIAGNOSIS — N183 Chronic kidney disease, stage 3 (moderate): Secondary | ICD-10-CM | POA: Diagnosis not present

## 2015-06-23 DIAGNOSIS — E1129 Type 2 diabetes mellitus with other diabetic kidney complication: Secondary | ICD-10-CM | POA: Diagnosis not present

## 2015-06-23 DIAGNOSIS — I129 Hypertensive chronic kidney disease with stage 1 through stage 4 chronic kidney disease, or unspecified chronic kidney disease: Secondary | ICD-10-CM | POA: Diagnosis not present

## 2015-06-30 ENCOUNTER — Encounter (HOSPITAL_COMMUNITY): Payer: Medicare Other

## 2015-07-03 ENCOUNTER — Other Ambulatory Visit (HOSPITAL_COMMUNITY): Payer: Self-pay

## 2015-07-03 DIAGNOSIS — I5033 Acute on chronic diastolic (congestive) heart failure: Secondary | ICD-10-CM | POA: Diagnosis not present

## 2015-07-03 DIAGNOSIS — N184 Chronic kidney disease, stage 4 (severe): Secondary | ICD-10-CM | POA: Diagnosis not present

## 2015-07-03 DIAGNOSIS — I129 Hypertensive chronic kidney disease with stage 1 through stage 4 chronic kidney disease, or unspecified chronic kidney disease: Secondary | ICD-10-CM | POA: Diagnosis not present

## 2015-07-03 DIAGNOSIS — E1122 Type 2 diabetes mellitus with diabetic chronic kidney disease: Secondary | ICD-10-CM | POA: Diagnosis not present

## 2015-07-06 ENCOUNTER — Encounter (HOSPITAL_COMMUNITY)
Admission: RE | Admit: 2015-07-06 | Discharge: 2015-07-06 | Disposition: A | Discharge status: Home / Self Care | Payer: Medicare Other | Source: Ambulatory Visit | Attending: Nephrology | Admitting: Nephrology

## 2015-07-06 DIAGNOSIS — N183 Chronic kidney disease, stage 3 (moderate): Secondary | ICD-10-CM | POA: Insufficient documentation

## 2015-07-06 DIAGNOSIS — D638 Anemia in other chronic diseases classified elsewhere: Secondary | ICD-10-CM | POA: Diagnosis not present

## 2015-07-06 LAB — FERRITIN: Ferritin: 656 ng/mL — ABNORMAL HIGH (ref 11–307)

## 2015-07-06 LAB — RENAL FUNCTION PANEL
Albumin: 3.6 g/dL (ref 3.5–5.0)
Anion gap: 8 (ref 5–15)
BUN: 34 mg/dL — ABNORMAL HIGH (ref 6–20)
CO2: 29 mmol/L (ref 22–32)
Calcium: 9.2 mg/dL (ref 8.9–10.3)
Chloride: 101 mmol/L (ref 101–111)
Creatinine, Ser: 2.54 mg/dL — ABNORMAL HIGH (ref 0.44–1.00)
GFR calc Af Amer: 20 mL/min — ABNORMAL LOW (ref >60)
GFR calc non Af Amer: 17 mL/min — ABNORMAL LOW (ref >60)
Glucose, Bld: 209 mg/dL — ABNORMAL HIGH (ref 65–99)
Phosphorus: 3.2 mg/dL (ref 2.5–4.6)
Potassium: 3.9 mmol/L (ref 3.5–5.1)
Sodium: 138 mmol/L (ref 135–145)

## 2015-07-06 LAB — IRON AND TIBC
Iron: 62 ug/dL (ref 28–170)
Saturation Ratios: 29 % (ref 10.4–31.8)
TIBC: 214 ug/dL — ABNORMAL LOW (ref 250–450)
UIBC: 152 ug/dL

## 2015-07-06 LAB — POCT HEMOGLOBIN-HEMACUE: Hemoglobin: 10.6 g/dL — ABNORMAL LOW (ref 12.0–15.0)

## 2015-07-06 MED ORDER — EPOETIN ALFA 10000 UNIT/ML IJ SOLN: 20000.0000 [IU] | INTRAMUSCULAR | Status: DC

## 2015-07-06 MED ORDER — EPOETIN ALFA 20000 UNIT/ML IJ SOLN
INTRAMUSCULAR | Status: AC
  Administered 2015-07-06: 20000 [IU]
  Filled 2015-07-06: qty 1

## 2015-07-07 LAB — PTH, INTACT AND CALCIUM
Calcium, Total (PTH): 9.4 mg/dL (ref 8.7–10.3)
PTH: 185 pg/mL — ABNORMAL HIGH (ref 15–65)

## 2015-07-09 DIAGNOSIS — G4733 Obstructive sleep apnea (adult) (pediatric): Secondary | ICD-10-CM | POA: Diagnosis not present

## 2015-07-20 ENCOUNTER — Encounter (HOSPITAL_COMMUNITY)
Admission: RE | Admit: 2015-07-20 | Discharge: 2015-07-20 | Disposition: A | Discharge status: Home / Self Care | Payer: Medicare Other | Source: Ambulatory Visit | Attending: Nephrology | Admitting: Nephrology

## 2015-07-20 DIAGNOSIS — D638 Anemia in other chronic diseases classified elsewhere: Secondary | ICD-10-CM | POA: Diagnosis not present

## 2015-07-20 DIAGNOSIS — N183 Chronic kidney disease, stage 3 (moderate): Secondary | ICD-10-CM | POA: Diagnosis not present

## 2015-07-20 LAB — POCT HEMOGLOBIN-HEMACUE: Hemoglobin: 10 g/dL — ABNORMAL LOW (ref 12.0–15.0)

## 2015-07-20 MED ORDER — EPOETIN ALFA 10000 UNIT/ML IJ SOLN: 20000.0000 [IU] | INTRAMUSCULAR | Status: DC

## 2015-07-20 MED ORDER — EPOETIN ALFA 20000 UNIT/ML IJ SOLN
INTRAMUSCULAR | Status: AC
  Administered 2015-07-20: 20000 [IU]
  Filled 2015-07-20: qty 1

## 2015-07-21 DIAGNOSIS — I5033 Acute on chronic diastolic (congestive) heart failure: Secondary | ICD-10-CM | POA: Diagnosis not present

## 2015-07-21 DIAGNOSIS — E1122 Type 2 diabetes mellitus with diabetic chronic kidney disease: Secondary | ICD-10-CM | POA: Diagnosis not present

## 2015-07-21 DIAGNOSIS — Z0000 Encounter for general adult medical examination without abnormal findings: Secondary | ICD-10-CM | POA: Diagnosis not present

## 2015-07-21 DIAGNOSIS — N184 Chronic kidney disease, stage 4 (severe): Secondary | ICD-10-CM | POA: Diagnosis not present

## 2015-07-21 DIAGNOSIS — E559 Vitamin D deficiency, unspecified: Secondary | ICD-10-CM | POA: Diagnosis not present

## 2015-07-21 DIAGNOSIS — I1310 Hypertensive heart and chronic kidney disease without heart failure, with stage 1 through stage 4 chronic kidney disease, or unspecified chronic kidney disease: Secondary | ICD-10-CM | POA: Diagnosis not present

## 2015-07-23 DIAGNOSIS — E1129 Type 2 diabetes mellitus with other diabetic kidney complication: Secondary | ICD-10-CM | POA: Diagnosis not present

## 2015-07-23 DIAGNOSIS — N2581 Secondary hyperparathyroidism of renal origin: Secondary | ICD-10-CM | POA: Diagnosis not present

## 2015-07-23 DIAGNOSIS — N183 Chronic kidney disease, stage 3 (moderate): Secondary | ICD-10-CM | POA: Diagnosis not present

## 2015-07-23 DIAGNOSIS — D631 Anemia in chronic kidney disease: Secondary | ICD-10-CM | POA: Diagnosis not present

## 2015-07-23 DIAGNOSIS — I129 Hypertensive chronic kidney disease with stage 1 through stage 4 chronic kidney disease, or unspecified chronic kidney disease: Secondary | ICD-10-CM | POA: Diagnosis not present

## 2015-08-04 ENCOUNTER — Encounter (HOSPITAL_COMMUNITY)
Admission: RE | Admit: 2015-08-04 | Discharge: 2015-08-04 | Disposition: A | Discharge status: Home / Self Care | Payer: Medicare Other | Source: Ambulatory Visit | Attending: Nephrology | Admitting: Nephrology

## 2015-08-04 DIAGNOSIS — D638 Anemia in other chronic diseases classified elsewhere: Secondary | ICD-10-CM | POA: Diagnosis not present

## 2015-08-04 DIAGNOSIS — N183 Chronic kidney disease, stage 3 (moderate): Secondary | ICD-10-CM | POA: Diagnosis not present

## 2015-08-04 LAB — RENAL FUNCTION PANEL
Albumin: 3.5 g/dL (ref 3.5–5.0)
Anion gap: 11 (ref 5–15)
BUN: 41 mg/dL — ABNORMAL HIGH (ref 6–20)
CO2: 28 mmol/L (ref 22–32)
Calcium: 8.9 mg/dL (ref 8.9–10.3)
Chloride: 102 mmol/L (ref 101–111)
Creatinine, Ser: 2.51 mg/dL — ABNORMAL HIGH (ref 0.44–1.00)
GFR calc Af Amer: 20 mL/min — ABNORMAL LOW (ref >60)
GFR calc non Af Amer: 17 mL/min — ABNORMAL LOW (ref >60)
Glucose, Bld: 184 mg/dL — ABNORMAL HIGH (ref 65–99)
Phosphorus: 3.9 mg/dL (ref 2.5–4.6)
Potassium: 3.3 mmol/L — ABNORMAL LOW (ref 3.5–5.1)
Sodium: 141 mmol/L (ref 135–145)

## 2015-08-04 LAB — IRON AND TIBC
Iron: 72 ug/dL (ref 28–170)
Saturation Ratios: 32 % — ABNORMAL HIGH (ref 10.4–31.8)
TIBC: 223 ug/dL — ABNORMAL LOW (ref 250–450)
UIBC: 151 ug/dL

## 2015-08-04 LAB — FERRITIN: Ferritin: 628 ng/mL — ABNORMAL HIGH (ref 11–307)

## 2015-08-04 LAB — POCT HEMOGLOBIN-HEMACUE: Hemoglobin: 10.9 g/dL — ABNORMAL LOW (ref 12.0–15.0)

## 2015-08-04 MED ORDER — EPOETIN ALFA 20000 UNIT/ML IJ SOLN
INTRAMUSCULAR | Status: AC
  Administered 2015-08-04: 20000 [IU] via SUBCUTANEOUS
  Filled 2015-08-04: qty 1

## 2015-08-04 MED ORDER — EPOETIN ALFA 10000 UNIT/ML IJ SOLN: 20000.0000 [IU] | INTRAMUSCULAR | Status: DC

## 2015-08-05 LAB — PTH, INTACT AND CALCIUM
Calcium, Total (PTH): 8.7 mg/dL (ref 8.7–10.3)
PTH: 292 pg/mL — ABNORMAL HIGH (ref 15–65)

## 2015-08-18 ENCOUNTER — Inpatient Hospital Stay (HOSPITAL_COMMUNITY): Admission: RE | Admit: 2015-08-18 | Payer: Medicare Other | Source: Ambulatory Visit

## 2015-08-21 ENCOUNTER — Encounter (HOSPITAL_COMMUNITY)
Admission: RE | Admit: 2015-08-21 | Discharge: 2015-08-21 | Disposition: A | Discharge status: Home / Self Care | Payer: Medicare Other | Source: Ambulatory Visit | Attending: Nephrology | Admitting: Nephrology

## 2015-08-21 DIAGNOSIS — N183 Chronic kidney disease, stage 3 (moderate): Secondary | ICD-10-CM | POA: Insufficient documentation

## 2015-08-21 DIAGNOSIS — D638 Anemia in other chronic diseases classified elsewhere: Secondary | ICD-10-CM | POA: Insufficient documentation

## 2015-08-21 LAB — POCT HEMOGLOBIN-HEMACUE: Hemoglobin: 10.5 g/dL — ABNORMAL LOW (ref 12.0–15.0)

## 2015-08-21 MED ORDER — EPOETIN ALFA 20000 UNIT/ML IJ SOLN
INTRAMUSCULAR | Status: AC
  Administered 2015-08-21: 20000 [IU] via SUBCUTANEOUS
  Filled 2015-08-21: qty 1

## 2015-08-21 MED ORDER — EPOETIN ALFA 10000 UNIT/ML IJ SOLN: 20000.0000 [IU] | INTRAMUSCULAR | Status: DC

## 2015-09-01 DIAGNOSIS — D631 Anemia in chronic kidney disease: Secondary | ICD-10-CM | POA: Diagnosis not present

## 2015-09-01 DIAGNOSIS — E1129 Type 2 diabetes mellitus with other diabetic kidney complication: Secondary | ICD-10-CM | POA: Diagnosis not present

## 2015-09-01 DIAGNOSIS — N2581 Secondary hyperparathyroidism of renal origin: Secondary | ICD-10-CM | POA: Diagnosis not present

## 2015-09-01 DIAGNOSIS — N183 Chronic kidney disease, stage 3 (moderate): Secondary | ICD-10-CM | POA: Diagnosis not present

## 2015-09-01 DIAGNOSIS — I129 Hypertensive chronic kidney disease with stage 1 through stage 4 chronic kidney disease, or unspecified chronic kidney disease: Secondary | ICD-10-CM | POA: Diagnosis not present

## 2015-09-04 ENCOUNTER — Encounter (HOSPITAL_COMMUNITY)
Admission: RE | Admit: 2015-09-04 | Discharge: 2015-09-04 | Disposition: A | Discharge status: Home / Self Care | Payer: Medicare Other | Source: Ambulatory Visit | Attending: Nephrology | Admitting: Nephrology

## 2015-09-04 DIAGNOSIS — N183 Chronic kidney disease, stage 3 (moderate): Secondary | ICD-10-CM | POA: Diagnosis not present

## 2015-09-04 DIAGNOSIS — D638 Anemia in other chronic diseases classified elsewhere: Secondary | ICD-10-CM | POA: Diagnosis not present

## 2015-09-04 LAB — IRON AND TIBC
Iron: 100 ug/dL (ref 28–170)
Saturation Ratios: 45 % — ABNORMAL HIGH (ref 10.4–31.8)
TIBC: 223 ug/dL — ABNORMAL LOW (ref 250–450)
UIBC: 123 ug/dL

## 2015-09-04 LAB — RENAL FUNCTION PANEL
Albumin: 3.4 g/dL — ABNORMAL LOW (ref 3.5–5.0)
Anion gap: 14 (ref 5–15)
BUN: 47 mg/dL — ABNORMAL HIGH (ref 6–20)
CO2: 30 mmol/L (ref 22–32)
Calcium: 9.5 mg/dL (ref 8.9–10.3)
Chloride: 95 mmol/L — ABNORMAL LOW (ref 101–111)
Creatinine, Ser: 2.56 mg/dL — ABNORMAL HIGH (ref 0.44–1.00)
GFR calc Af Amer: 20 mL/min — ABNORMAL LOW (ref >60)
GFR calc non Af Amer: 17 mL/min — ABNORMAL LOW (ref >60)
Glucose, Bld: 233 mg/dL — ABNORMAL HIGH (ref 65–99)
Phosphorus: 4.5 mg/dL (ref 2.5–4.6)
Potassium: 4.1 mmol/L (ref 3.5–5.1)
Sodium: 139 mmol/L (ref 135–145)

## 2015-09-04 LAB — FERRITIN: Ferritin: 776 ng/mL — ABNORMAL HIGH (ref 11–307)

## 2015-09-04 LAB — POCT HEMOGLOBIN-HEMACUE: Hemoglobin: 10.9 g/dL — ABNORMAL LOW (ref 12.0–15.0)

## 2015-09-04 MED ORDER — EPOETIN ALFA 10000 UNIT/ML IJ SOLN: 20000.0000 [IU] | INTRAMUSCULAR | Status: DC

## 2015-09-04 MED ORDER — EPOETIN ALFA 20000 UNIT/ML IJ SOLN
INTRAMUSCULAR | Status: AC
  Administered 2015-09-04: 20000 [IU] via SUBCUTANEOUS
  Filled 2015-09-04: qty 1

## 2015-09-05 LAB — PTH, INTACT AND CALCIUM
Calcium, Total (PTH): 9.2 mg/dL (ref 8.7–10.3)
PTH: 106 pg/mL — ABNORMAL HIGH (ref 15–65)

## 2015-09-18 ENCOUNTER — Encounter (HOSPITAL_COMMUNITY): Payer: Medicare Other

## 2015-09-25 ENCOUNTER — Encounter (HOSPITAL_COMMUNITY)
Admission: RE | Admit: 2015-09-25 | Discharge: 2015-09-25 | Disposition: A | Discharge status: Home / Self Care | Payer: Medicare Other | Source: Ambulatory Visit | Attending: Nephrology | Admitting: Nephrology

## 2015-09-25 DIAGNOSIS — N183 Chronic kidney disease, stage 3 (moderate): Secondary | ICD-10-CM | POA: Insufficient documentation

## 2015-09-25 DIAGNOSIS — D638 Anemia in other chronic diseases classified elsewhere: Secondary | ICD-10-CM | POA: Insufficient documentation

## 2015-09-25 LAB — POCT HEMOGLOBIN-HEMACUE: Hemoglobin: 10.4 g/dL — ABNORMAL LOW (ref 12.0–15.0)

## 2015-09-25 MED ORDER — EPOETIN ALFA 10000 UNIT/ML IJ SOLN: 20000.0000 [IU] | INTRAMUSCULAR | Status: DC

## 2015-09-25 MED ORDER — EPOETIN ALFA 20000 UNIT/ML IJ SOLN
INTRAMUSCULAR | Status: AC
  Administered 2015-09-25: 20000 [IU] via SUBCUTANEOUS
  Filled 2015-09-25: qty 1

## 2015-10-15 ENCOUNTER — Other Ambulatory Visit (HOSPITAL_COMMUNITY): Payer: Self-pay | Admitting: *Deleted

## 2015-10-16 ENCOUNTER — Inpatient Hospital Stay (HOSPITAL_COMMUNITY): Admission: RE | Admit: 2015-10-16 | Payer: Medicare Other | Source: Ambulatory Visit

## 2015-10-22 DIAGNOSIS — I130 Hypertensive heart and chronic kidney disease with heart failure and stage 1 through stage 4 chronic kidney disease, or unspecified chronic kidney disease: Secondary | ICD-10-CM | POA: Diagnosis not present

## 2015-10-22 DIAGNOSIS — J069 Acute upper respiratory infection, unspecified: Secondary | ICD-10-CM | POA: Diagnosis not present

## 2015-10-22 DIAGNOSIS — E1122 Type 2 diabetes mellitus with diabetic chronic kidney disease: Secondary | ICD-10-CM | POA: Diagnosis not present

## 2015-10-22 DIAGNOSIS — N08 Glomerular disorders in diseases classified elsewhere: Secondary | ICD-10-CM | POA: Diagnosis not present

## 2015-10-22 DIAGNOSIS — N184 Chronic kidney disease, stage 4 (severe): Secondary | ICD-10-CM | POA: Diagnosis not present

## 2015-10-26 ENCOUNTER — Encounter (HOSPITAL_COMMUNITY)
Admission: RE | Admit: 2015-10-26 | Discharge: 2015-10-26 | Disposition: A | Discharge status: Home / Self Care | Payer: Medicare Other | Source: Ambulatory Visit | Attending: Nephrology | Admitting: Nephrology

## 2015-10-26 DIAGNOSIS — D638 Anemia in other chronic diseases classified elsewhere: Secondary | ICD-10-CM | POA: Diagnosis not present

## 2015-10-26 DIAGNOSIS — N183 Chronic kidney disease, stage 3 (moderate): Secondary | ICD-10-CM | POA: Diagnosis not present

## 2015-10-26 LAB — RENAL FUNCTION PANEL
Albumin: 3.2 g/dL — ABNORMAL LOW (ref 3.5–5.0)
Anion gap: 10 (ref 5–15)
BUN: 41 mg/dL — ABNORMAL HIGH (ref 6–20)
CO2: 28 mmol/L (ref 22–32)
Calcium: 9.1 mg/dL (ref 8.9–10.3)
Chloride: 102 mmol/L (ref 101–111)
Creatinine, Ser: 2.45 mg/dL — ABNORMAL HIGH (ref 0.44–1.00)
GFR calc Af Amer: 21 mL/min — ABNORMAL LOW (ref >60)
GFR calc non Af Amer: 18 mL/min — ABNORMAL LOW (ref >60)
Glucose, Bld: 261 mg/dL — ABNORMAL HIGH (ref 65–99)
Phosphorus: 3.5 mg/dL (ref 2.5–4.6)
Potassium: 3.6 mmol/L (ref 3.5–5.1)
Sodium: 140 mmol/L (ref 135–145)

## 2015-10-26 LAB — POCT HEMOGLOBIN-HEMACUE: Hemoglobin: 10.3 g/dL — ABNORMAL LOW (ref 12.0–15.0)

## 2015-10-26 LAB — FERRITIN: Ferritin: 703 ng/mL — ABNORMAL HIGH (ref 11–307)

## 2015-10-26 LAB — IRON AND TIBC
Iron: 80 ug/dL (ref 28–170)
Saturation Ratios: 39 % — ABNORMAL HIGH (ref 10.4–31.8)
TIBC: 207 ug/dL — ABNORMAL LOW (ref 250–450)
UIBC: 127 ug/dL

## 2015-10-26 MED ORDER — EPOETIN ALFA 10000 UNIT/ML IJ SOLN: 20000.0000 [IU] | INTRAMUSCULAR | Status: DC

## 2015-10-26 MED ORDER — EPOETIN ALFA 20000 UNIT/ML IJ SOLN
INTRAMUSCULAR | Status: AC
  Administered 2015-10-26: 20000 [IU] via SUBCUTANEOUS
  Filled 2015-10-26: qty 1

## 2015-10-27 LAB — PTH, INTACT AND CALCIUM
Calcium, Total (PTH): 8.9 mg/dL (ref 8.7–10.3)
PTH: 183 pg/mL — ABNORMAL HIGH (ref 15–65)

## 2015-11-02 DIAGNOSIS — N2581 Secondary hyperparathyroidism of renal origin: Secondary | ICD-10-CM | POA: Diagnosis not present

## 2015-11-02 DIAGNOSIS — I129 Hypertensive chronic kidney disease with stage 1 through stage 4 chronic kidney disease, or unspecified chronic kidney disease: Secondary | ICD-10-CM | POA: Diagnosis not present

## 2015-11-02 DIAGNOSIS — E1129 Type 2 diabetes mellitus with other diabetic kidney complication: Secondary | ICD-10-CM | POA: Diagnosis not present

## 2015-11-02 DIAGNOSIS — N183 Chronic kidney disease, stage 3 (moderate): Secondary | ICD-10-CM | POA: Diagnosis not present

## 2015-11-02 DIAGNOSIS — D631 Anemia in chronic kidney disease: Secondary | ICD-10-CM | POA: Diagnosis not present

## 2015-11-16 ENCOUNTER — Encounter (HOSPITAL_COMMUNITY)
Admission: RE | Admit: 2015-11-16 | Discharge: 2015-11-16 | Disposition: A | Discharge status: Home / Self Care | Payer: Medicare Other | Source: Ambulatory Visit | Attending: Nephrology | Admitting: Nephrology

## 2015-11-16 DIAGNOSIS — N183 Chronic kidney disease, stage 3 (moderate): Secondary | ICD-10-CM | POA: Diagnosis not present

## 2015-11-16 DIAGNOSIS — D638 Anemia in other chronic diseases classified elsewhere: Secondary | ICD-10-CM | POA: Diagnosis not present

## 2015-11-16 LAB — POCT HEMOGLOBIN-HEMACUE: Hemoglobin: 9.8 g/dL — ABNORMAL LOW (ref 12.0–15.0)

## 2015-11-16 MED ORDER — EPOETIN ALFA 10000 UNIT/ML IJ SOLN: 20000.0000 [IU] | INTRAMUSCULAR | Status: DC

## 2015-11-16 MED ORDER — EPOETIN ALFA 20000 UNIT/ML IJ SOLN
INTRAMUSCULAR | Status: AC
  Administered 2015-11-16: 20000 [IU] via SUBCUTANEOUS
  Filled 2015-11-16: qty 1

## 2015-12-07 ENCOUNTER — Encounter (HOSPITAL_COMMUNITY)
Admission: RE | Admit: 2015-12-07 | Discharge: 2015-12-07 | Disposition: A | Discharge status: Home / Self Care | Payer: Medicare Other | Source: Ambulatory Visit | Attending: Nephrology | Admitting: Nephrology

## 2015-12-07 DIAGNOSIS — N183 Chronic kidney disease, stage 3 (moderate): Secondary | ICD-10-CM | POA: Diagnosis not present

## 2015-12-07 DIAGNOSIS — D638 Anemia in other chronic diseases classified elsewhere: Secondary | ICD-10-CM | POA: Insufficient documentation

## 2015-12-07 LAB — IRON AND TIBC
Iron: 70 ug/dL (ref 28–170)
Saturation Ratios: 32 % — ABNORMAL HIGH (ref 10.4–31.8)
TIBC: 216 ug/dL — ABNORMAL LOW (ref 250–450)
UIBC: 146 ug/dL

## 2015-12-07 LAB — RENAL FUNCTION PANEL
Albumin: 3.2 g/dL — ABNORMAL LOW (ref 3.5–5.0)
Anion gap: 9 (ref 5–15)
BUN: 41 mg/dL — ABNORMAL HIGH (ref 6–20)
CO2: 29 mmol/L (ref 22–32)
Calcium: 8.7 mg/dL — ABNORMAL LOW (ref 8.9–10.3)
Chloride: 102 mmol/L (ref 101–111)
Creatinine, Ser: 2.41 mg/dL — ABNORMAL HIGH (ref 0.44–1.00)
GFR calc Af Amer: 21 mL/min — ABNORMAL LOW (ref >60)
GFR calc non Af Amer: 18 mL/min — ABNORMAL LOW (ref >60)
Glucose, Bld: 193 mg/dL — ABNORMAL HIGH (ref 65–99)
Phosphorus: 3.5 mg/dL (ref 2.5–4.6)
Potassium: 3.8 mmol/L (ref 3.5–5.1)
Sodium: 140 mmol/L (ref 135–145)

## 2015-12-07 LAB — FERRITIN: Ferritin: 661 ng/mL — ABNORMAL HIGH (ref 11–307)

## 2015-12-07 LAB — POCT HEMOGLOBIN-HEMACUE: Hemoglobin: 9.9 g/dL — ABNORMAL LOW (ref 12.0–15.0)

## 2015-12-07 MED ORDER — EPOETIN ALFA 10000 UNIT/ML IJ SOLN: 20000.0000 [IU] | INTRAMUSCULAR | Status: DC

## 2015-12-07 MED ORDER — EPOETIN ALFA 20000 UNIT/ML IJ SOLN
INTRAMUSCULAR | Status: AC
  Administered 2015-12-07: 20000 [IU] via SUBCUTANEOUS
  Filled 2015-12-07: qty 1

## 2015-12-08 LAB — PTH, INTACT AND CALCIUM
Calcium, Total (PTH): 8.9 mg/dL (ref 8.7–10.3)
PTH: 234 pg/mL — ABNORMAL HIGH (ref 15–65)

## 2015-12-25 ENCOUNTER — Other Ambulatory Visit (HOSPITAL_COMMUNITY): Payer: Self-pay | Admitting: *Deleted

## 2015-12-28 ENCOUNTER — Inpatient Hospital Stay (HOSPITAL_COMMUNITY): Admission: RE | Admit: 2015-12-28 | Payer: Medicare Other | Source: Ambulatory Visit

## 2016-01-06 ENCOUNTER — Encounter (HOSPITAL_COMMUNITY)
Admission: RE | Admit: 2016-01-06 | Discharge: 2016-01-06 | Disposition: A | Discharge status: Home / Self Care | Payer: Medicare Other | Source: Ambulatory Visit | Attending: Nephrology | Admitting: Nephrology

## 2016-01-06 DIAGNOSIS — N183 Chronic kidney disease, stage 3 (moderate): Secondary | ICD-10-CM | POA: Diagnosis not present

## 2016-01-06 DIAGNOSIS — D638 Anemia in other chronic diseases classified elsewhere: Secondary | ICD-10-CM | POA: Diagnosis not present

## 2016-01-06 DIAGNOSIS — E1122 Type 2 diabetes mellitus with diabetic chronic kidney disease: Secondary | ICD-10-CM | POA: Diagnosis not present

## 2016-01-06 LAB — POCT HEMOGLOBIN-HEMACUE: Hemoglobin: 10.1 g/dL — ABNORMAL LOW (ref 12.0–15.0)

## 2016-01-06 MED ORDER — EPOETIN ALFA 20000 UNIT/ML IJ SOLN
INTRAMUSCULAR | Status: AC
  Administered 2016-01-06: 20000 [IU] via SUBCUTANEOUS
  Filled 2016-01-06: qty 1

## 2016-01-21 DIAGNOSIS — N08 Glomerular disorders in diseases classified elsewhere: Secondary | ICD-10-CM | POA: Diagnosis not present

## 2016-01-21 DIAGNOSIS — I5030 Unspecified diastolic (congestive) heart failure: Secondary | ICD-10-CM | POA: Diagnosis not present

## 2016-01-21 DIAGNOSIS — N184 Chronic kidney disease, stage 4 (severe): Secondary | ICD-10-CM | POA: Diagnosis not present

## 2016-01-21 DIAGNOSIS — E1122 Type 2 diabetes mellitus with diabetic chronic kidney disease: Secondary | ICD-10-CM | POA: Diagnosis not present

## 2016-01-27 ENCOUNTER — Inpatient Hospital Stay (HOSPITAL_COMMUNITY): Admission: RE | Admit: 2016-01-27 | Payer: Medicare Other | Source: Ambulatory Visit

## 2016-02-05 ENCOUNTER — Encounter (HOSPITAL_COMMUNITY)
Admission: RE | Admit: 2016-02-05 | Discharge: 2016-02-05 | Disposition: A | Discharge status: Home / Self Care | Payer: Medicare Other | Source: Ambulatory Visit | Attending: Nephrology | Admitting: Nephrology

## 2016-02-05 ENCOUNTER — Other Ambulatory Visit (HOSPITAL_COMMUNITY): Payer: Self-pay | Admitting: *Deleted

## 2016-02-05 DIAGNOSIS — D638 Anemia in other chronic diseases classified elsewhere: Secondary | ICD-10-CM | POA: Diagnosis not present

## 2016-02-05 DIAGNOSIS — N183 Chronic kidney disease, stage 3 (moderate): Secondary | ICD-10-CM | POA: Insufficient documentation

## 2016-02-05 LAB — RENAL FUNCTION PANEL
Albumin: 3.3 g/dL — ABNORMAL LOW (ref 3.5–5.0)
Anion gap: 9 (ref 5–15)
BUN: 47 mg/dL — ABNORMAL HIGH (ref 6–20)
CO2: 28 mmol/L (ref 22–32)
Calcium: 9.2 mg/dL (ref 8.9–10.3)
Chloride: 102 mmol/L (ref 101–111)
Creatinine, Ser: 2.55 mg/dL — ABNORMAL HIGH (ref 0.44–1.00)
GFR calc Af Amer: 20 mL/min — ABNORMAL LOW (ref >60)
GFR calc non Af Amer: 17 mL/min — ABNORMAL LOW (ref >60)
Glucose, Bld: 192 mg/dL — ABNORMAL HIGH (ref 65–99)
Phosphorus: 4.2 mg/dL (ref 2.5–4.6)
Potassium: 3.3 mmol/L — ABNORMAL LOW (ref 3.5–5.1)
Sodium: 139 mmol/L (ref 135–145)

## 2016-02-05 LAB — IRON AND TIBC
Iron: 69 ug/dL (ref 28–170)
Saturation Ratios: 31 % (ref 10.4–31.8)
TIBC: 224 ug/dL — ABNORMAL LOW (ref 250–450)
UIBC: 155 ug/dL

## 2016-02-05 LAB — FERRITIN: Ferritin: 729 ng/mL — ABNORMAL HIGH (ref 11–307)

## 2016-02-05 LAB — POCT HEMOGLOBIN-HEMACUE: Hemoglobin: 10.3 g/dL — ABNORMAL LOW (ref 12.0–15.0)

## 2016-02-05 MED ORDER — EPOETIN ALFA 20000 UNIT/ML IJ SOLN
INTRAMUSCULAR | Status: AC
  Administered 2016-02-05: 20000 [IU]
  Filled 2016-02-05: qty 1

## 2016-02-05 MED ORDER — EPOETIN ALFA 10000 UNIT/ML IJ SOLN: 20000.0000 [IU] | INTRAMUSCULAR | Status: AC

## 2016-02-10 LAB — PTH, INTACT AND CALCIUM
Calcium, Total (PTH): 9.3 mg/dL (ref 8.7–10.3)
PTH: 191 pg/mL — ABNORMAL HIGH (ref 15–65)

## 2016-02-26 ENCOUNTER — Encounter (HOSPITAL_COMMUNITY)
Admission: RE | Admit: 2016-02-26 | Discharge: 2016-02-26 | Disposition: A | Discharge status: Home / Self Care | Payer: Medicare Other | Source: Ambulatory Visit | Attending: Nephrology | Admitting: Nephrology

## 2016-02-26 DIAGNOSIS — N183 Chronic kidney disease, stage 3 (moderate): Secondary | ICD-10-CM | POA: Diagnosis not present

## 2016-02-26 DIAGNOSIS — D638 Anemia in other chronic diseases classified elsewhere: Secondary | ICD-10-CM | POA: Insufficient documentation

## 2016-02-26 LAB — POCT HEMOGLOBIN-HEMACUE: Hemoglobin: 10.5 g/dL — ABNORMAL LOW (ref 12.0–15.0)

## 2016-02-26 MED ORDER — EPOETIN ALFA 20000 UNIT/ML IJ SOLN
INTRAMUSCULAR | Status: AC
  Administered 2016-02-26: 20000 [IU] via SUBCUTANEOUS
  Filled 2016-02-26: qty 1

## 2016-02-26 MED ORDER — EPOETIN ALFA 10000 UNIT/ML IJ SOLN: 20000.0000 [IU] | INTRAMUSCULAR | Status: DC

## 2016-03-18 ENCOUNTER — Encounter (HOSPITAL_COMMUNITY)
Admission: RE | Admit: 2016-03-18 | Discharge: 2016-03-18 | Disposition: A | Discharge status: Home / Self Care | Payer: Medicare Other | Source: Ambulatory Visit | Attending: Nephrology | Admitting: Nephrology

## 2016-03-18 DIAGNOSIS — D638 Anemia in other chronic diseases classified elsewhere: Secondary | ICD-10-CM | POA: Diagnosis not present

## 2016-03-18 DIAGNOSIS — N183 Chronic kidney disease, stage 3 unspecified: Secondary | ICD-10-CM

## 2016-03-18 LAB — RENAL FUNCTION PANEL
Albumin: 3.3 g/dL — ABNORMAL LOW (ref 3.5–5.0)
Anion gap: 10 (ref 5–15)
BUN: 47 mg/dL — ABNORMAL HIGH (ref 6–20)
CO2: 28 mmol/L (ref 22–32)
Calcium: 8.9 mg/dL (ref 8.9–10.3)
Chloride: 99 mmol/L — ABNORMAL LOW (ref 101–111)
Creatinine, Ser: 2.32 mg/dL — ABNORMAL HIGH (ref 0.44–1.00)
GFR calc Af Amer: 22 mL/min — ABNORMAL LOW (ref >60)
GFR calc non Af Amer: 19 mL/min — ABNORMAL LOW (ref >60)
Glucose, Bld: 167 mg/dL — ABNORMAL HIGH (ref 65–99)
Phosphorus: 4.2 mg/dL (ref 2.5–4.6)
Potassium: 3.5 mmol/L (ref 3.5–5.1)
Sodium: 137 mmol/L (ref 135–145)

## 2016-03-18 LAB — IRON AND TIBC
Iron: 52 ug/dL (ref 28–170)
Saturation Ratios: 25 % (ref 10.4–31.8)
TIBC: 206 ug/dL — ABNORMAL LOW (ref 250–450)
UIBC: 154 ug/dL

## 2016-03-18 LAB — FERRITIN: Ferritin: 624 ng/mL — ABNORMAL HIGH (ref 11–307)

## 2016-03-18 LAB — POCT HEMOGLOBIN-HEMACUE: Hemoglobin: 9.9 g/dL — ABNORMAL LOW (ref 12.0–15.0)

## 2016-03-18 MED ORDER — EPOETIN ALFA 20000 UNIT/ML IJ SOLN
INTRAMUSCULAR | Status: AC
  Administered 2016-03-18: 20000 [IU]
  Filled 2016-03-18: qty 1

## 2016-03-18 MED ORDER — EPOETIN ALFA 10000 UNIT/ML IJ SOLN: 20000.0000 [IU] | INTRAMUSCULAR | Status: DC

## 2016-03-19 LAB — PTH, INTACT AND CALCIUM
Calcium, Total (PTH): 8.7 mg/dL (ref 8.7–10.3)
PTH: 203 pg/mL — ABNORMAL HIGH (ref 15–65)

## 2016-03-30 DIAGNOSIS — N2581 Secondary hyperparathyroidism of renal origin: Secondary | ICD-10-CM | POA: Diagnosis not present

## 2016-03-30 DIAGNOSIS — D631 Anemia in chronic kidney disease: Secondary | ICD-10-CM | POA: Diagnosis not present

## 2016-03-30 DIAGNOSIS — N183 Chronic kidney disease, stage 3 (moderate): Secondary | ICD-10-CM | POA: Diagnosis not present

## 2016-03-30 DIAGNOSIS — I129 Hypertensive chronic kidney disease with stage 1 through stage 4 chronic kidney disease, or unspecified chronic kidney disease: Secondary | ICD-10-CM | POA: Diagnosis not present

## 2016-03-30 DIAGNOSIS — E1129 Type 2 diabetes mellitus with other diabetic kidney complication: Secondary | ICD-10-CM | POA: Diagnosis not present

## 2016-04-07 ENCOUNTER — Other Ambulatory Visit (HOSPITAL_COMMUNITY): Payer: Self-pay | Admitting: *Deleted

## 2016-04-08 ENCOUNTER — Encounter (HOSPITAL_COMMUNITY)
Admission: RE | Admit: 2016-04-08 | Discharge: 2016-04-08 | Disposition: A | Discharge status: Home / Self Care | Payer: Medicare Other | Source: Ambulatory Visit | Attending: Nephrology | Admitting: Nephrology

## 2016-04-08 DIAGNOSIS — D638 Anemia in other chronic diseases classified elsewhere: Secondary | ICD-10-CM | POA: Diagnosis not present

## 2016-04-08 DIAGNOSIS — N183 Chronic kidney disease, stage 3 unspecified: Secondary | ICD-10-CM

## 2016-04-08 LAB — POCT HEMOGLOBIN-HEMACUE: Hemoglobin: 10.1 g/dL — ABNORMAL LOW (ref 12.0–15.0)

## 2016-04-08 MED ORDER — EPOETIN ALFA 10000 UNIT/ML IJ SOLN: 20000.0000 [IU] | INTRAMUSCULAR | Status: DC

## 2016-04-08 MED ORDER — EPOETIN ALFA 20000 UNIT/ML IJ SOLN
INTRAMUSCULAR | Status: AC
  Administered 2016-04-08: 20000 [IU]
  Filled 2016-04-08: qty 1

## 2016-04-15 DIAGNOSIS — H26491 Other secondary cataract, right eye: Secondary | ICD-10-CM | POA: Diagnosis not present

## 2016-04-15 DIAGNOSIS — E113293 Type 2 diabetes mellitus with mild nonproliferative diabetic retinopathy without macular edema, bilateral: Secondary | ICD-10-CM | POA: Diagnosis not present

## 2016-04-15 DIAGNOSIS — Z961 Presence of intraocular lens: Secondary | ICD-10-CM | POA: Diagnosis not present

## 2016-04-25 DIAGNOSIS — E1122 Type 2 diabetes mellitus with diabetic chronic kidney disease: Secondary | ICD-10-CM | POA: Diagnosis not present

## 2016-04-25 DIAGNOSIS — N08 Glomerular disorders in diseases classified elsewhere: Secondary | ICD-10-CM | POA: Diagnosis not present

## 2016-04-25 DIAGNOSIS — N184 Chronic kidney disease, stage 4 (severe): Secondary | ICD-10-CM | POA: Diagnosis not present

## 2016-04-25 DIAGNOSIS — I5030 Unspecified diastolic (congestive) heart failure: Secondary | ICD-10-CM | POA: Diagnosis not present

## 2016-04-29 ENCOUNTER — Encounter (HOSPITAL_COMMUNITY)
Admission: RE | Admit: 2016-04-29 | Discharge: 2016-04-29 | Disposition: A | Discharge status: Home / Self Care | Payer: Medicare Other | Source: Ambulatory Visit | Attending: Nephrology | Admitting: Nephrology

## 2016-04-29 DIAGNOSIS — N183 Chronic kidney disease, stage 3 unspecified: Secondary | ICD-10-CM

## 2016-04-29 DIAGNOSIS — D638 Anemia in other chronic diseases classified elsewhere: Secondary | ICD-10-CM | POA: Diagnosis not present

## 2016-04-29 LAB — RENAL FUNCTION PANEL
Albumin: 3.4 g/dL — ABNORMAL LOW (ref 3.5–5.0)
Anion gap: 9 (ref 5–15)
BUN: 41 mg/dL — ABNORMAL HIGH (ref 6–20)
CO2: 28 mmol/L (ref 22–32)
Calcium: 9.1 mg/dL (ref 8.9–10.3)
Chloride: 101 mmol/L (ref 101–111)
Creatinine, Ser: 2.43 mg/dL — ABNORMAL HIGH (ref 0.44–1.00)
GFR calc Af Amer: 21 mL/min — ABNORMAL LOW (ref >60)
GFR calc non Af Amer: 18 mL/min — ABNORMAL LOW (ref >60)
Glucose, Bld: 209 mg/dL — ABNORMAL HIGH (ref 65–99)
Phosphorus: 4.2 mg/dL (ref 2.5–4.6)
Potassium: 3.5 mmol/L (ref 3.5–5.1)
Sodium: 138 mmol/L (ref 135–145)

## 2016-04-29 LAB — IRON AND TIBC
Iron: 60 ug/dL (ref 28–170)
Saturation Ratios: 29 % (ref 10.4–31.8)
TIBC: 204 ug/dL — ABNORMAL LOW (ref 250–450)
UIBC: 144 ug/dL

## 2016-04-29 LAB — FERRITIN: Ferritin: 606 ng/mL — ABNORMAL HIGH (ref 11–307)

## 2016-04-29 LAB — POCT HEMOGLOBIN-HEMACUE: Hemoglobin: 10.6 g/dL — ABNORMAL LOW (ref 12.0–15.0)

## 2016-04-29 MED ORDER — EPOETIN ALFA 20000 UNIT/ML IJ SOLN
INTRAMUSCULAR | Status: AC
  Administered 2016-04-29: 20000 [IU]
  Filled 2016-04-29: qty 1

## 2016-04-29 MED ORDER — EPOETIN ALFA 10000 UNIT/ML IJ SOLN: 20000.0000 [IU] | INTRAMUSCULAR | Status: DC

## 2016-05-20 ENCOUNTER — Encounter (HOSPITAL_COMMUNITY): Payer: Self-pay

## 2016-05-20 ENCOUNTER — Encounter (HOSPITAL_COMMUNITY)
Admission: RE | Admit: 2016-05-20 | Discharge: 2016-05-20 | Disposition: A | Discharge status: Home / Self Care | Payer: Medicare Other | Source: Ambulatory Visit | Attending: Nephrology | Admitting: Nephrology

## 2016-05-20 DIAGNOSIS — D638 Anemia in other chronic diseases classified elsewhere: Secondary | ICD-10-CM | POA: Insufficient documentation

## 2016-05-20 DIAGNOSIS — N183 Chronic kidney disease, stage 3 unspecified: Secondary | ICD-10-CM

## 2016-05-20 MED ORDER — EPOETIN ALFA 20000 UNIT/ML IJ SOLN
INTRAMUSCULAR | Status: AC
  Administered 2016-05-20: 20000 [IU] via SUBCUTANEOUS
  Filled 2016-05-20: qty 1

## 2016-05-20 MED ORDER — EPOETIN ALFA 10000 UNIT/ML IJ SOLN: 20000.0000 [IU] | INTRAMUSCULAR | Status: DC

## 2016-05-21 LAB — PTH, INTACT AND CALCIUM
Calcium, Total (PTH): 9 mg/dL (ref 8.7–10.3)
PTH: 184 pg/mL — ABNORMAL HIGH (ref 15–65)

## 2016-05-23 LAB — POCT HEMOGLOBIN-HEMACUE: Hemoglobin: 10.8 g/dL — ABNORMAL LOW (ref 12.0–15.0)

## 2016-06-10 ENCOUNTER — Encounter (HOSPITAL_COMMUNITY)
Admission: RE | Admit: 2016-06-10 | Discharge: 2016-06-10 | Disposition: A | Discharge status: Home / Self Care | Payer: Medicare Other | Source: Ambulatory Visit | Attending: Nephrology | Admitting: Nephrology

## 2016-06-10 DIAGNOSIS — N183 Chronic kidney disease, stage 3 unspecified: Secondary | ICD-10-CM

## 2016-06-10 DIAGNOSIS — D638 Anemia in other chronic diseases classified elsewhere: Secondary | ICD-10-CM | POA: Insufficient documentation

## 2016-06-10 LAB — RENAL FUNCTION PANEL
Albumin: 3.3 g/dL — ABNORMAL LOW (ref 3.5–5.0)
Anion gap: 8 (ref 5–15)
BUN: 47 mg/dL — ABNORMAL HIGH (ref 6–20)
CO2: 29 mmol/L (ref 22–32)
Calcium: 9 mg/dL (ref 8.9–10.3)
Chloride: 102 mmol/L (ref 101–111)
Creatinine, Ser: 2.55 mg/dL — ABNORMAL HIGH (ref 0.44–1.00)
GFR calc Af Amer: 20 mL/min — ABNORMAL LOW (ref >60)
GFR calc non Af Amer: 17 mL/min — ABNORMAL LOW (ref >60)
Glucose, Bld: 158 mg/dL — ABNORMAL HIGH (ref 65–99)
Phosphorus: 4.1 mg/dL (ref 2.5–4.6)
Potassium: 3.3 mmol/L — ABNORMAL LOW (ref 3.5–5.1)
Sodium: 139 mmol/L (ref 135–145)

## 2016-06-10 LAB — IRON AND TIBC
Iron: 44 ug/dL (ref 28–170)
Saturation Ratios: 21 % (ref 10.4–31.8)
TIBC: 207 ug/dL — ABNORMAL LOW (ref 250–450)
UIBC: 163 ug/dL

## 2016-06-10 LAB — POCT HEMOGLOBIN-HEMACUE: Hemoglobin: 10.5 g/dL — ABNORMAL LOW (ref 12.0–15.0)

## 2016-06-10 LAB — FERRITIN: Ferritin: 619 ng/mL — ABNORMAL HIGH (ref 11–307)

## 2016-06-10 MED ORDER — EPOETIN ALFA 20000 UNIT/ML IJ SOLN
INTRAMUSCULAR | Status: AC
  Administered 2016-06-10: 20000 [IU] via SUBCUTANEOUS
  Filled 2016-06-10: qty 1

## 2016-06-10 MED ORDER — EPOETIN ALFA 10000 UNIT/ML IJ SOLN: 20000.0000 [IU] | INTRAMUSCULAR | Status: DC

## 2016-06-28 ENCOUNTER — Other Ambulatory Visit (HOSPITAL_COMMUNITY): Payer: Self-pay | Admitting: *Deleted

## 2016-06-29 ENCOUNTER — Ambulatory Visit (HOSPITAL_COMMUNITY)
Admission: RE | Admit: 2016-06-29 | Discharge: 2016-06-29 | Disposition: A | Discharge status: Home / Self Care | Payer: Medicare Other | Source: Ambulatory Visit | Attending: Nephrology | Admitting: Nephrology

## 2016-06-29 DIAGNOSIS — N183 Chronic kidney disease, stage 3 unspecified: Secondary | ICD-10-CM

## 2016-06-29 DIAGNOSIS — D638 Anemia in other chronic diseases classified elsewhere: Secondary | ICD-10-CM | POA: Diagnosis not present

## 2016-06-29 LAB — POCT HEMOGLOBIN-HEMACUE: Hemoglobin: 10.6 g/dL — ABNORMAL LOW (ref 12.0–15.0)

## 2016-06-29 MED ORDER — EPOETIN ALFA 10000 UNIT/ML IJ SOLN: 20000.0000 [IU] | INTRAMUSCULAR | Status: DC

## 2016-06-29 MED ORDER — SODIUM CHLORIDE 0.9 % IV SOLN
510.0000 mg | INTRAVENOUS | Status: DC
  Administered 2016-06-29: 510 mg via INTRAVENOUS
  Filled 2016-06-29: qty 17

## 2016-06-29 MED ORDER — EPOETIN ALFA 20000 UNIT/ML IJ SOLN
INTRAMUSCULAR | Status: AC
  Administered 2016-06-29: 20000 [IU]
  Filled 2016-06-29: qty 1

## 2016-06-30 LAB — PTH, INTACT AND CALCIUM
Calcium, Total (PTH): 9 mg/dL (ref 8.7–10.3)
PTH: 183 pg/mL — ABNORMAL HIGH (ref 15–65)

## 2016-07-08 ENCOUNTER — Ambulatory Visit (HOSPITAL_COMMUNITY)
Admission: RE | Admit: 2016-07-08 | Discharge: 2016-07-08 | Disposition: A | Discharge status: Home / Self Care | Payer: Medicare Other | Source: Ambulatory Visit | Attending: Nephrology | Admitting: Nephrology

## 2016-07-08 DIAGNOSIS — N189 Chronic kidney disease, unspecified: Secondary | ICD-10-CM | POA: Insufficient documentation

## 2016-07-08 MED ORDER — SODIUM CHLORIDE 0.9 % IV SOLN
510.0000 mg | INTRAVENOUS | Status: AC
  Administered 2016-07-08: 510 mg via INTRAVENOUS
  Filled 2016-07-08: qty 17

## 2016-07-21 ENCOUNTER — Encounter (HOSPITAL_COMMUNITY)
Admission: RE | Admit: 2016-07-21 | Discharge: 2016-07-21 | Disposition: A | Discharge status: Home / Self Care | Payer: Medicare Other | Source: Ambulatory Visit | Attending: Nephrology | Admitting: Nephrology

## 2016-07-21 DIAGNOSIS — N183 Chronic kidney disease, stage 3 unspecified: Secondary | ICD-10-CM

## 2016-07-21 DIAGNOSIS — D638 Anemia in other chronic diseases classified elsewhere: Secondary | ICD-10-CM | POA: Insufficient documentation

## 2016-07-21 LAB — RENAL FUNCTION PANEL
Albumin: 3.4 g/dL — ABNORMAL LOW (ref 3.5–5.0)
Anion gap: 10 (ref 5–15)
BUN: 48 mg/dL — ABNORMAL HIGH (ref 6–20)
CO2: 27 mmol/L (ref 22–32)
Calcium: 8.8 mg/dL — ABNORMAL LOW (ref 8.9–10.3)
Chloride: 102 mmol/L (ref 101–111)
Creatinine, Ser: 2.51 mg/dL — ABNORMAL HIGH (ref 0.44–1.00)
GFR calc Af Amer: 20 mL/min — ABNORMAL LOW (ref >60)
GFR calc non Af Amer: 17 mL/min — ABNORMAL LOW (ref >60)
Glucose, Bld: 166 mg/dL — ABNORMAL HIGH (ref 65–99)
Phosphorus: 4.4 mg/dL (ref 2.5–4.6)
Potassium: 3.2 mmol/L — ABNORMAL LOW (ref 3.5–5.1)
Sodium: 139 mmol/L (ref 135–145)

## 2016-07-21 LAB — IRON AND TIBC
Iron: 105 ug/dL (ref 28–170)
Saturation Ratios: 54 % — ABNORMAL HIGH (ref 10.4–31.8)
TIBC: 195 ug/dL — ABNORMAL LOW (ref 250–450)
UIBC: 90 ug/dL

## 2016-07-21 LAB — FERRITIN: Ferritin: 878 ng/mL — ABNORMAL HIGH (ref 11–307)

## 2016-07-21 LAB — POCT HEMOGLOBIN-HEMACUE: Hemoglobin: 10.9 g/dL — ABNORMAL LOW (ref 12.0–15.0)

## 2016-07-21 MED ORDER — EPOETIN ALFA 20000 UNIT/ML IJ SOLN
INTRAMUSCULAR | Status: AC
  Administered 2016-07-21: 20000 [IU] via SUBCUTANEOUS
  Filled 2016-07-21: qty 1

## 2016-07-21 MED ORDER — EPOETIN ALFA 10000 UNIT/ML IJ SOLN: 20000.0000 [IU] | INTRAMUSCULAR | Status: DC

## 2016-08-12 ENCOUNTER — Inpatient Hospital Stay (HOSPITAL_COMMUNITY): Admission: RE | Admit: 2016-08-12 | Payer: Self-pay | Source: Ambulatory Visit

## 2016-08-22 ENCOUNTER — Encounter (HOSPITAL_COMMUNITY)
Admission: RE | Admit: 2016-08-22 | Discharge: 2016-08-22 | Disposition: A | Discharge status: Home / Self Care | Payer: Medicare Other | Source: Ambulatory Visit | Attending: Nephrology | Admitting: Nephrology

## 2016-08-22 DIAGNOSIS — N183 Chronic kidney disease, stage 3 unspecified: Secondary | ICD-10-CM

## 2016-08-22 DIAGNOSIS — D638 Anemia in other chronic diseases classified elsewhere: Secondary | ICD-10-CM | POA: Diagnosis not present

## 2016-08-22 LAB — POCT HEMOGLOBIN-HEMACUE: Hemoglobin: 10.1 g/dL — ABNORMAL LOW (ref 12.0–15.0)

## 2016-08-22 MED ORDER — EPOETIN ALFA 10000 UNIT/ML IJ SOLN: 20000.0000 [IU] | INTRAMUSCULAR | Status: DC

## 2016-08-22 MED ORDER — EPOETIN ALFA 20000 UNIT/ML IJ SOLN
INTRAMUSCULAR | Status: AC
  Administered 2016-08-22: 20000 [IU] via SUBCUTANEOUS
  Filled 2016-08-22: qty 1

## 2016-08-23 LAB — PTH, INTACT AND CALCIUM
Calcium, Total (PTH): 8.7 mg/dL (ref 8.7–10.3)
PTH: 160 pg/mL — ABNORMAL HIGH (ref 15–65)

## 2016-09-07 ENCOUNTER — Other Ambulatory Visit (HOSPITAL_COMMUNITY): Payer: Self-pay | Admitting: *Deleted

## 2016-09-08 ENCOUNTER — Encounter (HOSPITAL_COMMUNITY)
Admission: RE | Admit: 2016-09-08 | Discharge: 2016-09-08 | Disposition: A | Discharge status: Home / Self Care | Payer: Medicare Other | Source: Ambulatory Visit | Attending: Nephrology | Admitting: Nephrology

## 2016-09-08 DIAGNOSIS — N183 Chronic kidney disease, stage 3 unspecified: Secondary | ICD-10-CM

## 2016-09-08 DIAGNOSIS — D638 Anemia in other chronic diseases classified elsewhere: Secondary | ICD-10-CM | POA: Insufficient documentation

## 2016-09-08 LAB — IRON AND TIBC
Iron: 83 ug/dL (ref 28–170)
Saturation Ratios: 38 % — ABNORMAL HIGH (ref 10.4–31.8)
TIBC: 216 ug/dL — ABNORMAL LOW (ref 250–450)
UIBC: 133 ug/dL

## 2016-09-08 LAB — RENAL FUNCTION PANEL
Albumin: 3.4 g/dL — ABNORMAL LOW (ref 3.5–5.0)
Anion gap: 8 (ref 5–15)
BUN: 43 mg/dL — ABNORMAL HIGH (ref 6–20)
CO2: 27 mmol/L (ref 22–32)
Calcium: 8.8 mg/dL — ABNORMAL LOW (ref 8.9–10.3)
Chloride: 103 mmol/L (ref 101–111)
Creatinine, Ser: 2.29 mg/dL — ABNORMAL HIGH (ref 0.44–1.00)
GFR calc Af Amer: 22 mL/min — ABNORMAL LOW (ref >60)
GFR calc non Af Amer: 19 mL/min — ABNORMAL LOW (ref >60)
Glucose, Bld: 152 mg/dL — ABNORMAL HIGH (ref 65–99)
Phosphorus: 4 mg/dL (ref 2.5–4.6)
Potassium: 3.6 mmol/L (ref 3.5–5.1)
Sodium: 138 mmol/L (ref 135–145)

## 2016-09-08 LAB — POCT HEMOGLOBIN-HEMACUE: Hemoglobin: 10 g/dL — ABNORMAL LOW (ref 12.0–15.0)

## 2016-09-08 LAB — FERRITIN: Ferritin: 739 ng/mL — ABNORMAL HIGH (ref 11–307)

## 2016-09-08 MED ORDER — EPOETIN ALFA 10000 UNIT/ML IJ SOLN: 20000.0000 [IU] | INTRAMUSCULAR | Status: DC

## 2016-09-08 MED ORDER — EPOETIN ALFA 20000 UNIT/ML IJ SOLN
INTRAMUSCULAR | Status: AC
  Administered 2016-09-08: 20000 [IU]
  Filled 2016-09-08: qty 1

## 2016-09-09 ENCOUNTER — Emergency Department (HOSPITAL_COMMUNITY)
Admission: EM | Admit: 2016-09-09 | Discharge: 2016-09-09 | Disposition: A | Discharge status: Home / Self Care | Payer: Medicare Other | Attending: Emergency Medicine | Admitting: Emergency Medicine

## 2016-09-09 ENCOUNTER — Encounter (HOSPITAL_COMMUNITY): Payer: Self-pay | Admitting: Nurse Practitioner

## 2016-09-09 DIAGNOSIS — Z79899 Other long term (current) drug therapy: Secondary | ICD-10-CM | POA: Insufficient documentation

## 2016-09-09 DIAGNOSIS — E1122 Type 2 diabetes mellitus with diabetic chronic kidney disease: Secondary | ICD-10-CM | POA: Insufficient documentation

## 2016-09-09 DIAGNOSIS — Z794 Long term (current) use of insulin: Secondary | ICD-10-CM | POA: Insufficient documentation

## 2016-09-09 DIAGNOSIS — I130 Hypertensive heart and chronic kidney disease with heart failure and stage 1 through stage 4 chronic kidney disease, or unspecified chronic kidney disease: Secondary | ICD-10-CM | POA: Insufficient documentation

## 2016-09-09 DIAGNOSIS — E1165 Type 2 diabetes mellitus with hyperglycemia: Secondary | ICD-10-CM | POA: Diagnosis not present

## 2016-09-09 DIAGNOSIS — R739 Hyperglycemia, unspecified: Secondary | ICD-10-CM

## 2016-09-09 DIAGNOSIS — I10 Essential (primary) hypertension: Secondary | ICD-10-CM

## 2016-09-09 DIAGNOSIS — I129 Hypertensive chronic kidney disease with stage 1 through stage 4 chronic kidney disease, or unspecified chronic kidney disease: Secondary | ICD-10-CM | POA: Diagnosis not present

## 2016-09-09 DIAGNOSIS — I5033 Acute on chronic diastolic (congestive) heart failure: Secondary | ICD-10-CM | POA: Insufficient documentation

## 2016-09-09 DIAGNOSIS — Z8673 Personal history of transient ischemic attack (TIA), and cerebral infarction without residual deficits: Secondary | ICD-10-CM | POA: Insufficient documentation

## 2016-09-09 DIAGNOSIS — Z7982 Long term (current) use of aspirin: Secondary | ICD-10-CM | POA: Insufficient documentation

## 2016-09-09 DIAGNOSIS — N183 Chronic kidney disease, stage 3 (moderate): Secondary | ICD-10-CM | POA: Diagnosis not present

## 2016-09-09 DIAGNOSIS — I110 Hypertensive heart disease with heart failure: Secondary | ICD-10-CM | POA: Diagnosis not present

## 2016-09-09 DIAGNOSIS — N189 Chronic kidney disease, unspecified: Secondary | ICD-10-CM

## 2016-09-09 LAB — BASIC METABOLIC PANEL
Anion gap: 9 (ref 5–15)
BUN: 42 mg/dL — ABNORMAL HIGH (ref 6–20)
CO2: 25 mmol/L (ref 22–32)
Calcium: 8.9 mg/dL (ref 8.9–10.3)
Chloride: 100 mmol/L — ABNORMAL LOW (ref 101–111)
Creatinine, Ser: 2.38 mg/dL — ABNORMAL HIGH (ref 0.44–1.00)
GFR calc Af Amer: 21 mL/min — ABNORMAL LOW (ref >60)
GFR calc non Af Amer: 18 mL/min — ABNORMAL LOW (ref >60)
Glucose, Bld: 318 mg/dL — ABNORMAL HIGH (ref 65–99)
Potassium: 3.6 mmol/L (ref 3.5–5.1)
Sodium: 134 mmol/L — ABNORMAL LOW (ref 135–145)

## 2016-09-09 LAB — CBC
HCT: 34.9 % — ABNORMAL LOW (ref 36.0–46.0)
Hemoglobin: 11 g/dL — ABNORMAL LOW (ref 12.0–15.0)
MCH: 22.4 pg — ABNORMAL LOW (ref 26.0–34.0)
MCHC: 31.5 g/dL (ref 30.0–36.0)
MCV: 70.9 fL — ABNORMAL LOW (ref 78.0–100.0)
Platelets: 257 10*3/uL (ref 150–400)
RBC: 4.92 MIL/uL (ref 3.87–5.11)
RDW: 17.1 % — ABNORMAL HIGH (ref 11.5–15.5)
WBC: 7.8 10*3/uL (ref 4.0–10.5)

## 2016-09-09 LAB — URINALYSIS, ROUTINE W REFLEX MICROSCOPIC
Bilirubin Urine: NEGATIVE
Glucose, UA: 150 mg/dL — AB
Ketones, ur: NEGATIVE mg/dL
Leukocytes, UA: NEGATIVE
Nitrite: NEGATIVE
Protein, ur: 100 mg/dL — AB
Specific Gravity, Urine: 1.008 (ref 1.005–1.030)
pH: 6 (ref 5.0–8.0)

## 2016-09-09 LAB — CBG MONITORING, ED
Glucose-Capillary: 291 mg/dL — ABNORMAL HIGH (ref 65–99)
Glucose-Capillary: 247 mg/dL — ABNORMAL HIGH (ref 65–99)

## 2016-09-09 MED ORDER — CLONIDINE HCL 0.1 MG PO TABS
0.1000 mg | ORAL_TABLET | Freq: Once | ORAL | Status: AC
  Administered 2016-09-09: 0.1 mg via ORAL
  Filled 2016-09-09: qty 1

## 2016-09-09 MED ORDER — HYDRALAZINE HCL 25 MG PO TABS
25.0000 mg | ORAL_TABLET | Freq: Once | ORAL | Status: AC
  Administered 2016-09-09: 25 mg via ORAL
  Filled 2016-09-09: qty 1

## 2016-09-09 NOTE — Discharge Instructions (Signed)
Please read and follow all provided instructions.  Your diagnoses today include:  1. Hyperglycemia without ketosis   2. Essential hypertension   3. Chronic kidney disease, unspecified CKD stage     Tests performed today include:  Blood counts and electrolytes - blood sugar 250 to low 300's  Urine test - no infection    Vital signs. See below for your results today.   Medications prescribed:   None  Take any prescribed medications only as directed.  Home care instructions:  Follow any educational materials contained in this packet.  Have your insulin pen checked by your pharmacist.   Continue home insulin and blood pressure medications as prescribed.   Follow-up instructions: Please follow-up with your primary care provider in the next 5 days for further evaluation of your symptoms.   Return instructions:   Please return to the Emergency Department if you experience worsening symptoms.   Please return if you have any other emergent concerns.  Additional Information:  Your vital signs today were: BP 183/82    Pulse 77    Temp 98.8 F (37.1 C) (Oral)    Resp 18    SpO2 98%  If your blood pressure (BP) was elevated above 135/85 this visit, please have this repeated by your doctor within one month. --------------

## 2016-09-09 NOTE — ED Provider Notes (Signed)
[facility] DEPT Provider Note   CSN: [id number] Arrival date & time: 09/09/16  1507     History   Chief Complaint Chief Complaint  Patient presents with  . Hyperglycemia    HPI [name] [name] is a 81 y.o. female.  Patient with history of diabetes, high blood pressure, CHF, CKD presents with daughter of her concern of elevated blood sugar readings since the 300s today. Patient is otherwise feeling at her baseline. No vomiting or abdominal pain. No fevers, URI symptoms, cough. No urinary symptoms. Patient states that her Levemir pen did not feel like it injected like it should today. Patient then used another pen which seemed to work. However, her blood sugar did not get lower which caused her concern. Blood pressure from the elevated in emergency department today as well. The onset of this condition was acute. The course is constant. Aggravating factors: none. Alleviating factors: none.        Past Medical History:  Diagnosis Date  . Arthritis    RA  . CHF (congestive heart failure) ([place])   . CKD (chronic kidney disease) stage 3, GFR 30-59 ml/min   . Diabetes mellitus without complication (HCC)    insulin dependent  . Hyperlipidemia   . Hypertension   . Shortness of breath dyspnea   . Sleep apnea    cpap  . TIA (transient ischemic attack) 2015    Patient Active Problem List   Diagnosis Date Noted  . Acute on chronic diastolic CHF (congestive heart failure) ([place]) 05/26/2015  . Type 2 diabetes mellitus with nephropathy ([place]) 05/26/2015  . CHF (congestive heart failure) ([place]) 05/25/2015  . Anasarca 05/25/2015  . DM (diabetes mellitus), type 2 with renal complications ([place]) 11/94/1740  . Chronic anemia 05/25/2015  . Hypocalcemia   . Encephalopathy, hypertensive 06/11/2014  . Essential hypertension 06/11/2014  . Type 2 diabetes mellitus without complication ([place]) 81/44/8185  . HLD (hyperlipidemia) 06/11/2014  . OSA (obstructive sleep apnea) 03/06/2014  .  Chronic kidney disease 01/13/2014  . TIA (transient ischemic attack) 01/11/2014  . Hypertensive urgency 01/11/2014  . Diabetes mellitus ([place]) 01/11/2014    Past Surgical History:  Procedure Laterality Date  . ABDOMINAL HYSTERECTOMY  1974    OB History    No data available       Home Medications    Prior to Admission medications   Medication Sig Start Date End Date Taking? Authorizing Provider  acetaminophen (TYLENOL) 500 MG tablet Take 1,000 mg by mouth every 8 (eight) hours as needed for mild pain.    Historical Provider, MD  amLODipine (NORVASC) 10 MG tablet Take 10 mg by mouth daily. For blood pressure    Historical Provider, MD  aspirin 325 MG tablet Take 325 mg by mouth daily. For stroke prevention with history of TIA    Historical Provider, MD  atorvastatin (LIPITOR) 20 MG tablet Take 20 mg by mouth daily at 6 PM. For cholesterol 06/03/14   Historical Provider, MD  calcitRIOL (ROCALTROL) 0.25 MCG capsule Take 0.25 mcg by mouth daily. For low calcium due to kidney disease 06/26/12   Historical Provider, MD  carvedilol (COREG) 6.25 MG tablet Take 1 tablet (6.25 mg total) by mouth 2 (two) times daily with a meal. 06/03/15   Verlee Monte, MD  cloNIDine (CATAPRES) 0.1 MG tablet Take 0.1 mg by mouth 3 (three) times daily. For blood pressure 05/30/14   Historical Provider, MD  furosemide (LASIX) 80 MG tablet Take 2 tablets (160 mg total) by mouth 2 (  two) times daily. For fluid removal with congestive heart failure 06/03/15   Verlee Monte, MD  hydrALAZINE (APRESOLINE) 25 MG tablet Take 25 mg by mouth 2 (two) times daily. With breakfast and supper. For blood pressure 05/30/14   Historical Provider, MD  Insulin Detemir (LEVEMIR) 100 UNIT/ML Pen Inject 30 units with breakfast and 15 units before bedtime. 06/03/15   Verlee Monte, MD  isosorbide mononitrate (IMDUR) 30 MG 24 hr tablet Take 1 tablet (30 mg total) by mouth daily. 06/03/15   Verlee Monte, MD  loratadine (CLARITIN) 10 MG tablet  Take 10 mg by mouth daily. For allergies 05/19/14   Historical Provider, MD  omeprazole (PRILOSEC) 40 MG capsule Take 40 mg by mouth daily. For reflux    Historical Provider, MD  polyethylene glycol (MIRALAX / GLYCOLAX) packet Take 17 g by mouth every hour as needed (constipation).    Historical Provider, MD    Family History Family History  Problem Relation Age of Onset  . Diabetes type II Mother   . Hypertension Mother   . Diabetes type II Father   . Hypertension Other   . Hyperlipidemia Other   . Stroke Other     Social History Social History  Substance Use Topics  . Smoking status: Never Smoker  . Smokeless tobacco: Never Used  . Alcohol use No     Allergies   Penicillins   Review of Systems Review of Systems  Constitutional: Negative for fever.  HENT: Negative for rhinorrhea and sore throat.   Eyes: Negative for redness.  Respiratory: Negative for cough.   Cardiovascular: Negative for chest pain.  Gastrointestinal: Negative for abdominal pain, diarrhea, nausea and vomiting.  Genitourinary: Negative for dysuria.  Musculoskeletal: Negative for myalgias.  Skin: Negative for rash.  Neurological: Negative for headaches.     Physical Exam Updated Vital Signs BP (!) 222/93   Pulse 98   Temp 98.8 F (37.1 C) (Oral)   Resp 18   SpO2 99%   Physical Exam  Constitutional: She appears well-developed and well-nourished.  HENT:  Head: Normocephalic and atraumatic.  Mouth/Throat: Oropharynx is clear and moist.  Eyes: Conjunctivae are normal. Right eye exhibits no discharge. Left eye exhibits no discharge.  Neck: Normal range of motion. Neck supple.  Cardiovascular: Normal rate, regular rhythm and normal heart sounds.   Pulmonary/Chest: Effort normal and breath sounds normal.  Abdominal: Soft. There is no tenderness.  Neurological: She is alert.  Skin: Skin is warm and dry.  Psychiatric: She has a normal mood and affect.  Nursing note and vitals  reviewed.    ED Treatments / Results  Labs (all labs ordered are listed, but only abnormal results are displayed) Labs Reviewed  BASIC METABOLIC PANEL - Abnormal; Notable for the following:       Result Value   Sodium 134 (*)    Chloride 100 (*)    Glucose, Bld 318 (*)    BUN 42 (*)    Creatinine, Ser 2.38 (*)    GFR calc non Af Amer 18 (*)    GFR calc Af Amer 21 (*)    All other components within normal limits  CBC - Abnormal; Notable for the following:    Hemoglobin 11.0 (*)    HCT 34.9 (*)    MCV 70.9 (*)    MCH 22.4 (*)    RDW 17.1 (*)    All other components within normal limits  CBG MONITORING, ED - Abnormal; Notable for the following:  Glucose-Capillary 291 (*)    All other components within normal limits  URINALYSIS, ROUTINE W REFLEX MICROSCOPIC  CBG MONITORING, ED  CBG MONITORING, ED  CBG MONITORING, ED    Procedures Procedures (including critical care time)  Medications Ordered in ED Medications - No data to display   Initial Impression / Assessment and Plan / ED Course  I have reviewed the triage vital signs and the nursing notes.  Pertinent labs & imaging results that were available during my care of the patient were reviewed by me and considered in my medical decision making (see chart for details).     Patient seen and examined. Work-up initiated.   Vital signs reviewed and are as follows: BP (!) 222/93   Pulse 98   Temp 98.8 F (37.1 C) (Oral)   Resp 18   SpO2 99%   Will give home BP meds. Will give small amount of SQ insulin if CBG still > 300. Awaiting UA to ensure no infection contributing to hyperglycemia. Daughter to have the [place] pen examined by pharmacy and she will use alternate pen in the meantime.   6:32 PM blood sugar is made to 100s. Blood pressure improved with home medications. At this time, patient is ready for discharge to home. She will go home with her daughter. Plan as above. Patient seen and discussed with Dr.  Maryan Rued.   Patient urged to return with worsening symptoms or other concerns. Patient verbalized understanding and agrees with plan.    Final Clinical Impressions(s) / ED Diagnoses   Final diagnoses:  Hyperglycemia without ketosis  Essential hypertension  Chronic kidney disease, unspecified CKD stage   Hyperglycemia: Possibly related to malfunctioning Levamir pen. No signs of DKA. Blood sugars maintain in the 200s to low 300s here.  Hypertension: Patient on home blood pressure medications. Improved with evening blood pressure medications.  Chronic kidney disease: Stable  New Prescriptions New Prescriptions   No medications on file     Carlisle Cater, PA-C 09/09/16 1833    Blanchie Dessert, MD 09/09/16 2317

## 2016-09-09 NOTE — ED Triage Notes (Addendum)
Pt presents with c/o elevated blood sugar. she has noticed her blood sugar running high at home since yesterday. she has been getting readings over 300. She began to have trouble with her insulin pen yesterday before this started, and felt the needle wasn't working like it should. Her blood pressure is elevated today. She reports a history of high blood pressure and reports taking her daily meds as prescribed with no recent changes. She states she overall feels fine and denies any specific complaints

## 2016-09-09 NOTE — ED Notes (Signed)
PA Josh at the bedside

## 2016-09-27 DIAGNOSIS — I129 Hypertensive chronic kidney disease with stage 1 through stage 4 chronic kidney disease, or unspecified chronic kidney disease: Secondary | ICD-10-CM | POA: Diagnosis not present

## 2016-09-27 DIAGNOSIS — N2581 Secondary hyperparathyroidism of renal origin: Secondary | ICD-10-CM | POA: Diagnosis not present

## 2016-09-27 DIAGNOSIS — N183 Chronic kidney disease, stage 3 (moderate): Secondary | ICD-10-CM | POA: Diagnosis not present

## 2016-09-27 DIAGNOSIS — E1129 Type 2 diabetes mellitus with other diabetic kidney complication: Secondary | ICD-10-CM | POA: Diagnosis not present

## 2016-09-27 DIAGNOSIS — D631 Anemia in chronic kidney disease: Secondary | ICD-10-CM | POA: Diagnosis not present

## 2016-09-29 ENCOUNTER — Encounter (HOSPITAL_COMMUNITY)
Admission: RE | Admit: 2016-09-29 | Discharge: 2016-09-29 | Disposition: A | Discharge status: Home / Self Care | Payer: Medicare Other | Source: Ambulatory Visit | Attending: Nephrology | Admitting: Nephrology

## 2016-09-29 DIAGNOSIS — N183 Chronic kidney disease, stage 3 unspecified: Secondary | ICD-10-CM

## 2016-09-29 DIAGNOSIS — D638 Anemia in other chronic diseases classified elsewhere: Secondary | ICD-10-CM | POA: Diagnosis not present

## 2016-09-29 LAB — POCT HEMOGLOBIN-HEMACUE: Hemoglobin: 10.4 g/dL — ABNORMAL LOW (ref 12.0–15.0)

## 2016-09-29 MED ORDER — EPOETIN ALFA 10000 UNIT/ML IJ SOLN
20000.0000 [IU] | INTRAMUSCULAR | Status: DC
Start: 2016-09-29 — End: 2016-09-30

## 2016-09-29 MED ORDER — EPOETIN ALFA 20000 UNIT/ML IJ SOLN
INTRAMUSCULAR | Status: AC
  Administered 2016-09-29: 20000 [IU] via SUBCUTANEOUS
  Filled 2016-09-29: qty 1

## 2016-09-30 LAB — PTH, INTACT AND CALCIUM
Calcium, Total (PTH): 8.6 mg/dL — ABNORMAL LOW (ref 8.7–10.3)
PTH: 248 pg/mL — ABNORMAL HIGH (ref 15–65)

## 2016-10-13 DIAGNOSIS — N184 Chronic kidney disease, stage 4 (severe): Secondary | ICD-10-CM | POA: Diagnosis not present

## 2016-10-13 DIAGNOSIS — I5030 Unspecified diastolic (congestive) heart failure: Secondary | ICD-10-CM | POA: Diagnosis not present

## 2016-10-13 DIAGNOSIS — N08 Glomerular disorders in diseases classified elsewhere: Secondary | ICD-10-CM | POA: Diagnosis not present

## 2016-10-13 DIAGNOSIS — E1122 Type 2 diabetes mellitus with diabetic chronic kidney disease: Secondary | ICD-10-CM | POA: Diagnosis not present

## 2016-10-20 ENCOUNTER — Inpatient Hospital Stay (HOSPITAL_COMMUNITY): Admission: RE | Admit: 2016-10-20 | Payer: Self-pay | Source: Ambulatory Visit

## 2016-10-24 ENCOUNTER — Encounter (HOSPITAL_COMMUNITY)
Admission: RE | Admit: 2016-10-24 | Discharge: 2016-10-24 | Disposition: A | Discharge status: Home / Self Care | Payer: Medicare Other | Source: Ambulatory Visit | Attending: Nephrology | Admitting: Nephrology

## 2016-10-24 DIAGNOSIS — N183 Chronic kidney disease, stage 3 unspecified: Secondary | ICD-10-CM

## 2016-10-24 DIAGNOSIS — D638 Anemia in other chronic diseases classified elsewhere: Secondary | ICD-10-CM | POA: Diagnosis not present

## 2016-10-24 MED ORDER — EPOETIN ALFA 10000 UNIT/ML IJ SOLN: 20000.0000 [IU] | INTRAMUSCULAR | Status: DC

## 2016-10-24 MED ORDER — CLONIDINE HCL 0.1 MG PO TABS: 0.1000 mg | ORAL_TABLET | Freq: Once | ORAL | Status: DC

## 2016-10-24 MED ORDER — EPOETIN ALFA 20000 UNIT/ML IJ SOLN
INTRAMUSCULAR | Status: AC
  Filled 2016-10-24: qty 1

## 2016-10-24 MED ORDER — CLONIDINE HCL 0.1 MG PO TABS
ORAL_TABLET | ORAL | Status: AC
  Administered 2016-10-24: 0.1 mg via ORAL
  Filled 2016-10-24: qty 1

## 2016-10-24 MED ORDER — CLONIDINE HCL 0.1 MG PO TABS
0.1000 mg | ORAL_TABLET | ORAL | Status: AC
  Administered 2016-10-24: 0.1 mg via ORAL

## 2016-10-24 MED ORDER — CLONIDINE HCL 0.1 MG PO TABS
ORAL_TABLET | ORAL | Status: AC
  Filled 2016-10-24: qty 1

## 2016-10-24 NOTE — Progress Notes (Addendum)
Pt arrived with elevated BP 203/83 so we allowed her to sit for 15 minutes. BP remained elevated at 220/93.  Clonidine given per orders.  After 30 minutes BP 210/87. Dr Moshe Cipro paged. Clonidine 0.1mg  repeated as instructed. After 15 minutes BP 196/85. Pt rescheduled for next week and instructed to take BP medications as ordered and office will be notifying her of any medicine changes per Dr Shelva Majestic instructions.

## 2016-11-04 ENCOUNTER — Encounter (HOSPITAL_COMMUNITY)
Admission: RE | Admit: 2016-11-04 | Discharge: 2016-11-04 | Disposition: A | Discharge status: Home / Self Care | Payer: Medicare Other | Source: Ambulatory Visit | Attending: Nephrology | Admitting: Nephrology

## 2016-11-04 DIAGNOSIS — N183 Chronic kidney disease, stage 3 unspecified: Secondary | ICD-10-CM

## 2016-11-04 DIAGNOSIS — D638 Anemia in other chronic diseases classified elsewhere: Secondary | ICD-10-CM | POA: Diagnosis not present

## 2016-11-04 LAB — RENAL FUNCTION PANEL
Albumin: 3.5 g/dL (ref 3.5–5.0)
Anion gap: 9 (ref 5–15)
BUN: 46 mg/dL — ABNORMAL HIGH (ref 6–20)
CO2: 26 mmol/L (ref 22–32)
Calcium: 8.7 mg/dL — ABNORMAL LOW (ref 8.9–10.3)
Chloride: 103 mmol/L (ref 101–111)
Creatinine, Ser: 2.68 mg/dL — ABNORMAL HIGH (ref 0.44–1.00)
GFR calc Af Amer: 18 mL/min — ABNORMAL LOW (ref >60)
GFR calc non Af Amer: 16 mL/min — ABNORMAL LOW (ref >60)
Glucose, Bld: 160 mg/dL — ABNORMAL HIGH (ref 65–99)
Phosphorus: 4.1 mg/dL (ref 2.5–4.6)
Potassium: 3.9 mmol/L (ref 3.5–5.1)
Sodium: 138 mmol/L (ref 135–145)

## 2016-11-04 LAB — IRON AND TIBC
Iron: 77 ug/dL (ref 28–170)
Saturation Ratios: 37 % — ABNORMAL HIGH (ref 10.4–31.8)
TIBC: 206 ug/dL — ABNORMAL LOW (ref 250–450)
UIBC: 129 ug/dL

## 2016-11-04 LAB — FERRITIN: Ferritin: 725 ng/mL — ABNORMAL HIGH (ref 11–307)

## 2016-11-04 LAB — POCT HEMOGLOBIN-HEMACUE: Hemoglobin: 10.6 g/dL — ABNORMAL LOW (ref 12.0–15.0)

## 2016-11-04 MED ORDER — EPOETIN ALFA 10000 UNIT/ML IJ SOLN: 20000.0000 [IU] | INTRAMUSCULAR | Status: DC

## 2016-11-04 MED ORDER — EPOETIN ALFA 20000 UNIT/ML IJ SOLN
INTRAMUSCULAR | Status: AC
  Administered 2016-11-04: 20000 [IU] via SUBCUTANEOUS
  Filled 2016-11-04: qty 1

## 2016-11-05 LAB — PTH, INTACT AND CALCIUM
Calcium, Total (PTH): 8.7 mg/dL (ref 8.7–10.3)
PTH: 228 pg/mL — ABNORMAL HIGH (ref 15–65)

## 2016-11-08 DIAGNOSIS — E1122 Type 2 diabetes mellitus with diabetic chronic kidney disease: Secondary | ICD-10-CM | POA: Diagnosis not present

## 2016-11-08 DIAGNOSIS — Z0000 Encounter for general adult medical examination without abnormal findings: Secondary | ICD-10-CM | POA: Diagnosis not present

## 2016-11-08 DIAGNOSIS — I130 Hypertensive heart and chronic kidney disease with heart failure and stage 1 through stage 4 chronic kidney disease, or unspecified chronic kidney disease: Secondary | ICD-10-CM | POA: Diagnosis not present

## 2016-11-08 DIAGNOSIS — N08 Glomerular disorders in diseases classified elsewhere: Secondary | ICD-10-CM | POA: Diagnosis not present

## 2016-11-08 DIAGNOSIS — N184 Chronic kidney disease, stage 4 (severe): Secondary | ICD-10-CM | POA: Diagnosis not present

## 2016-11-14 ENCOUNTER — Encounter (HOSPITAL_COMMUNITY): Payer: Self-pay

## 2016-11-25 ENCOUNTER — Encounter (HOSPITAL_COMMUNITY)
Admission: RE | Admit: 2016-11-25 | Discharge: 2016-11-25 | Disposition: A | Discharge status: Home / Self Care | Payer: Medicare Other | Source: Ambulatory Visit | Attending: Nephrology | Admitting: Nephrology

## 2016-11-25 DIAGNOSIS — N183 Chronic kidney disease, stage 3 unspecified: Secondary | ICD-10-CM

## 2016-11-25 DIAGNOSIS — D638 Anemia in other chronic diseases classified elsewhere: Secondary | ICD-10-CM | POA: Insufficient documentation

## 2016-11-25 LAB — POCT HEMOGLOBIN-HEMACUE: Hemoglobin: 9.5 g/dL — ABNORMAL LOW (ref 12.0–15.0)

## 2016-11-25 MED ORDER — EPOETIN ALFA 10000 UNIT/ML IJ SOLN
20000.0000 [IU] | INTRAMUSCULAR | Status: DC
  Administered 2016-11-25: 20000 [IU] via SUBCUTANEOUS

## 2016-11-25 MED ORDER — EPOETIN ALFA 20000 UNIT/ML IJ SOLN
INTRAMUSCULAR | Status: AC
  Filled 2016-11-25: qty 1

## 2016-11-26 ENCOUNTER — Emergency Department (HOSPITAL_COMMUNITY)
Admission: EM | Admit: 2016-11-26 | Discharge: 2016-11-26 | Disposition: A | Discharge status: Home / Self Care | Payer: Medicare Other | Attending: Emergency Medicine | Admitting: Emergency Medicine

## 2016-11-26 ENCOUNTER — Encounter (HOSPITAL_COMMUNITY): Payer: Self-pay | Admitting: Nurse Practitioner

## 2016-11-26 DIAGNOSIS — Z794 Long term (current) use of insulin: Secondary | ICD-10-CM | POA: Diagnosis not present

## 2016-11-26 DIAGNOSIS — E1165 Type 2 diabetes mellitus with hyperglycemia: Secondary | ICD-10-CM | POA: Insufficient documentation

## 2016-11-26 DIAGNOSIS — Z8673 Personal history of transient ischemic attack (TIA), and cerebral infarction without residual deficits: Secondary | ICD-10-CM | POA: Insufficient documentation

## 2016-11-26 DIAGNOSIS — Z7982 Long term (current) use of aspirin: Secondary | ICD-10-CM | POA: Diagnosis not present

## 2016-11-26 DIAGNOSIS — N183 Chronic kidney disease, stage 3 (moderate): Secondary | ICD-10-CM | POA: Diagnosis not present

## 2016-11-26 DIAGNOSIS — E1122 Type 2 diabetes mellitus with diabetic chronic kidney disease: Secondary | ICD-10-CM | POA: Insufficient documentation

## 2016-11-26 DIAGNOSIS — I130 Hypertensive heart and chronic kidney disease with heart failure and stage 1 through stage 4 chronic kidney disease, or unspecified chronic kidney disease: Secondary | ICD-10-CM | POA: Insufficient documentation

## 2016-11-26 DIAGNOSIS — Z79899 Other long term (current) drug therapy: Secondary | ICD-10-CM | POA: Insufficient documentation

## 2016-11-26 DIAGNOSIS — I5033 Acute on chronic diastolic (congestive) heart failure: Secondary | ICD-10-CM | POA: Insufficient documentation

## 2016-11-26 DIAGNOSIS — R739 Hyperglycemia, unspecified: Secondary | ICD-10-CM

## 2016-11-26 LAB — CBC
HCT: 34.4 % — ABNORMAL LOW (ref 36.0–46.0)
Hemoglobin: 10.5 g/dL — ABNORMAL LOW (ref 12.0–15.0)
MCH: 21.8 pg — ABNORMAL LOW (ref 26.0–34.0)
MCHC: 30.5 g/dL (ref 30.0–36.0)
MCV: 71.4 fL — ABNORMAL LOW (ref 78.0–100.0)
Platelets: 261 10*3/uL (ref 150–400)
RBC: 4.82 MIL/uL (ref 3.87–5.11)
RDW: 16.3 % — ABNORMAL HIGH (ref 11.5–15.5)
WBC: 8.8 10*3/uL (ref 4.0–10.5)

## 2016-11-26 LAB — BASIC METABOLIC PANEL
Anion gap: 10 (ref 5–15)
BUN: 31 mg/dL — ABNORMAL HIGH (ref 6–20)
CO2: 27 mmol/L (ref 22–32)
Calcium: 8.7 mg/dL — ABNORMAL LOW (ref 8.9–10.3)
Chloride: 97 mmol/L — ABNORMAL LOW (ref 101–111)
Creatinine, Ser: 2.28 mg/dL — ABNORMAL HIGH (ref 0.44–1.00)
GFR calc Af Amer: 22 mL/min — ABNORMAL LOW (ref >60)
GFR calc non Af Amer: 19 mL/min — ABNORMAL LOW (ref >60)
Glucose, Bld: 227 mg/dL — ABNORMAL HIGH (ref 65–99)
Potassium: 3.8 mmol/L (ref 3.5–5.1)
Sodium: 134 mmol/L — ABNORMAL LOW (ref 135–145)

## 2016-11-26 LAB — URINALYSIS, ROUTINE W REFLEX MICROSCOPIC
Bilirubin Urine: NEGATIVE
Glucose, UA: 50 mg/dL — AB
Hgb urine dipstick: NEGATIVE
Ketones, ur: NEGATIVE mg/dL
Leukocytes, UA: NEGATIVE
Nitrite: NEGATIVE
Protein, ur: NEGATIVE mg/dL
Specific Gravity, Urine: 1.003 — ABNORMAL LOW (ref 1.005–1.030)
pH: 7 (ref 5.0–8.0)

## 2016-11-26 LAB — CBG MONITORING, ED: Glucose-Capillary: 190 mg/dL — ABNORMAL HIGH (ref 65–99)

## 2016-11-26 NOTE — ED Provider Notes (Signed)
[facility] DEPT Provider Note   CSN: [id number] Arrival date & time: 11/26/16  1115     History   Chief Complaint Chief Complaint  Patient presents with  . Hyperglycemia  . Back Pain    HPI [name] [name] is a 81 y.o. female.  Pt w PMHx T2DM, CKD, CHF, TIA, presents w hyperglycemia this morning. States her BG was 345 before she took her morning dose of Levemir 30u. She took her evening dose of Levemir 15u as prescribed. States this is the 1st occurrence of a high BG in the morning, usually they are lower than 100. She is asx; denies HA, N/V, abd pain. Reports that she recently took a few days of miralax and an oral pill for constipation as prescribed by her PCP; she has been having nonbloody diarrhea x2days. Reports low back pain this morning but has since resolved.      Past Medical History:  Diagnosis Date  . Arthritis    RA  . CHF (congestive heart failure) ([place])   . CKD (chronic kidney disease) stage 3, GFR 30-59 ml/min   . Diabetes mellitus without complication (HCC)    insulin dependent  . Hyperlipidemia   . Hypertension   . Shortness of breath dyspnea   . Sleep apnea    cpap  . TIA (transient ischemic attack) 2015    Patient Active Problem List   Diagnosis Date Noted  . Acute on chronic diastolic CHF (congestive heart failure) ([place]) 05/26/2015  . Type 2 diabetes mellitus with nephropathy ([place]) 05/26/2015  . CHF (congestive heart failure) ([place]) 05/25/2015  . Anasarca 05/25/2015  . DM (diabetes mellitus), type 2 with renal complications ([place]) 16/08/930  . Chronic anemia 05/25/2015  . Hypocalcemia   . Encephalopathy, hypertensive 06/11/2014  . Essential hypertension 06/11/2014  . Type 2 diabetes mellitus without complication ([place]) 35/57/3220  . HLD (hyperlipidemia) 06/11/2014  . OSA (obstructive sleep apnea) 03/06/2014  . Chronic kidney disease 01/13/2014  . TIA (transient ischemic attack) 01/11/2014  . Hypertensive urgency 01/11/2014  .  Diabetes mellitus ([place]) 01/11/2014    Past Surgical History:  Procedure Laterality Date  . ABDOMINAL HYSTERECTOMY  1974    OB History    No data available       Home Medications    Prior to Admission medications   Medication Sig Start Date End Date Taking? Authorizing Provider  acetaminophen (TYLENOL) 500 MG tablet Take 1,000 mg by mouth every 8 (eight) hours as needed for mild pain.    Historical Provider, MD  amLODipine (NORVASC) 10 MG tablet Take 10 mg by mouth daily. For blood pressure    Historical Provider, MD  aspirin 325 MG tablet Take 325 mg by mouth daily. For stroke prevention with history of TIA    Historical Provider, MD  atorvastatin (LIPITOR) 20 MG tablet Take 20 mg by mouth daily at 6 PM. For cholesterol 06/03/14   Historical Provider, MD  calcitRIOL (ROCALTROL) 0.25 MCG capsule Take 0.25 mcg by mouth daily. For low calcium due to kidney disease 06/26/12   Historical Provider, MD  carvedilol (COREG) 6.25 MG tablet Take 1 tablet (6.25 mg total) by mouth 2 (two) times daily with a meal. 06/03/15   Verlee Monte, MD  cloNIDine (CATAPRES) 0.1 MG tablet Take 0.1 mg by mouth 3 (three) times daily. For blood pressure 05/30/14   Historical Provider, MD  furosemide (LASIX) 80 MG tablet Take 2 tablets (160 mg total) by mouth 2 (two) times daily. For fluid removal with  congestive heart failure 06/03/15   Verlee Monte, MD  hydrALAZINE (APRESOLINE) 25 MG tablet Take 25 mg by mouth 2 (two) times daily. With breakfast and supper. For blood pressure 05/30/14   Historical Provider, MD  Insulin Detemir (LEVEMIR) 100 UNIT/ML Pen Inject 30 units with breakfast and 15 units before bedtime. 06/03/15   Verlee Monte, MD  isosorbide mononitrate (IMDUR) 30 MG 24 hr tablet Take 1 tablet (30 mg total) by mouth daily. 06/03/15   Verlee Monte, MD  loratadine (CLARITIN) 10 MG tablet Take 10 mg by mouth daily. For allergies 05/19/14   Historical Provider, MD  omeprazole (PRILOSEC) 40 MG capsule Take 40  mg by mouth daily. For reflux    Historical Provider, MD  polyethylene glycol (MIRALAX / GLYCOLAX) packet Take 17 g by mouth every hour as needed (constipation).    Historical Provider, MD    Family History Family History  Problem Relation Age of Onset  . Diabetes type II Mother   . Hypertension Mother   . Diabetes type II Father   . Hypertension Other   . Hyperlipidemia Other   . Stroke Other     Social History Social History  Substance Use Topics  . Smoking status: Never Smoker  . Smokeless tobacco: Never Used  . Alcohol use No     Allergies   Penicillins   Review of Systems Review of Systems  Constitutional: Negative for chills and fever.  Gastrointestinal: Positive for diarrhea. Negative for abdominal pain, nausea and vomiting.  Genitourinary: Negative for dysuria and frequency.  Neurological: Negative for headaches.     Physical Exam Updated Vital Signs BP (!) 167/72   Pulse 74   Temp 98.3 F (36.8 C) (Oral)   Resp 16   SpO2 100%   Physical Exam  Constitutional: She appears well-developed and well-nourished.  HENT:  Head: Normocephalic and atraumatic.  Mouth/Throat: Oropharynx is clear and moist.  Eyes: Conjunctivae are normal.  Cardiovascular: Normal rate, regular rhythm, normal heart sounds and intact distal pulses.  Exam reveals no friction rub.   No murmur heard. Pulmonary/Chest: Effort normal and breath sounds normal. No respiratory distress. She has no wheezes. She has no rales.  Abdominal: Soft. Bowel sounds are normal. She exhibits no distension. There is no tenderness. There is no rebound.  Neurological: She is alert.  Psychiatric: She has a normal mood and affect. Her behavior is normal.  Nursing note and vitals reviewed.    ED Treatments / Results  Labs (all labs ordered are listed, but only abnormal results are displayed) Labs Reviewed  BASIC METABOLIC PANEL - Abnormal; Notable for the following:       Result Value   Sodium 134 (*)     Chloride 97 (*)    Glucose, Bld 227 (*)    BUN 31 (*)    Creatinine, Ser 2.28 (*)    Calcium 8.7 (*)    GFR calc non Af Amer 19 (*)    GFR calc Af Amer 22 (*)    All other components within normal limits  CBC - Abnormal; Notable for the following:    Hemoglobin 10.5 (*)    HCT 34.4 (*)    MCV 71.4 (*)    MCH 21.8 (*)    RDW 16.3 (*)    All other components within normal limits  URINALYSIS, ROUTINE W REFLEX MICROSCOPIC - Abnormal; Notable for the following:    Color, Urine COLORLESS (*)    Specific Gravity, Urine 1.003 (*)  Glucose, UA 50 (*)    All other components within normal limits  CBG MONITORING, ED - Abnormal; Notable for the following:    Glucose-Capillary 190 (*)    All other components within normal limits    EKG  EKG Interpretation None       Radiology No results found.  Procedures Procedures (including critical care time)  Medications Ordered in ED Medications - No data to display   Initial Impression / Assessment and Plan / ED Course  I have reviewed the triage vital signs and the nursing notes.  Pertinent labs & imaging results that were available during my care of the patient were reviewed by me and considered in my medical decision making (see chart for details).     Pt w hyperglycemia. CBC, BMP unchanged from previous. U/A reassuring. CBG 190. Pt is asx, alert, exam reassuring. Encouraged PCP follow up for DM. Pt afebrile, not in distress, hemodynamically stable. Safe for discharge home.  Patient discussed with and seen by Dr. Lacinda Axon. Discussed results, findings, treatment and follow up. Patient advised of return precautions. Patient verbalized understanding and agreed with plan.   Final Clinical Impressions(s) / ED Diagnoses   Final diagnoses:  Hyperglycemia    New Prescriptions New Prescriptions   No medications on file     Martinique N Russo, PA-C 11/26/16 [place], MD 11/27/16 1022

## 2016-11-26 NOTE — Discharge Instructions (Signed)
Please read instructions below. Follow up with your primary care provider about your blood sugars. Return to the ER for new or worsening symptoms.

## 2016-11-26 NOTE — ED Triage Notes (Signed)
Pt presents with c/o hyperglycemia and lower back pain. She reports her blood glucose was 345 this morning.She tried to eat cinnamon and walk this morning but she checked and her glucose was still elevated. She has been taking her levemir as prescribed. She is concerned because her blood glucose has been running in the 60's - 80s in the mornings recently. She also c/o lower back pain that began this morning. The pain has resolved now. She is concerned that it may be related to a new medication her doctor started her on for constipation.

## 2016-12-06 ENCOUNTER — Encounter: Payer: Self-pay | Admitting: Sports Medicine

## 2016-12-06 ENCOUNTER — Ambulatory Visit (INDEPENDENT_AMBULATORY_CARE_PROVIDER_SITE_OTHER): Payer: Medicare Other | Admitting: Sports Medicine

## 2016-12-06 DIAGNOSIS — M79674 Pain in right toe(s): Secondary | ICD-10-CM

## 2016-12-06 DIAGNOSIS — B351 Tinea unguium: Secondary | ICD-10-CM

## 2016-12-06 DIAGNOSIS — E119 Type 2 diabetes mellitus without complications: Secondary | ICD-10-CM

## 2016-12-06 DIAGNOSIS — M79675 Pain in left toe(s): Secondary | ICD-10-CM | POA: Diagnosis not present

## 2016-12-06 NOTE — Progress Notes (Signed)
Subjective: [name] [name] is a 81 y.o. female patient with history of diabetes who presents to office today complaining of [place], painful nails  while ambulating in shoes; unable to trim. Patient states that the glucose reading this morning was 167 mg/dl. Patient denies any new changes in medication or new problems. Patient denies any new cramping, numbness, burning or tingling in the legs.  Patient Active Problem List   Diagnosis Date Noted  . Acute on chronic diastolic CHF (congestive heart failure) ([place]) 05/26/2015  . Type 2 diabetes mellitus with nephropathy ([place]) 05/26/2015  . CHF (congestive heart failure) ([place]) 05/25/2015  . Anasarca 05/25/2015  . DM (diabetes mellitus), type 2 with renal complications ([place]) 70/35/0093  . Chronic anemia 05/25/2015  . Hypocalcemia   . Encephalopathy, hypertensive 06/11/2014  . Essential hypertension 06/11/2014  . Type 2 diabetes mellitus without complication ([place]) 81/82/9937  . HLD (hyperlipidemia) 06/11/2014  . OSA (obstructive sleep apnea) 03/06/2014  . Chronic kidney disease 01/13/2014  . TIA (transient ischemic attack) 01/11/2014  . Hypertensive urgency 01/11/2014  . Diabetes mellitus ([name]) 01/11/2014   Current Outpatient Prescriptions on File Prior to Visit  Medication Sig Dispense Refill  . acetaminophen (TYLENOL) 500 MG tablet Take 1,000 mg by mouth every 8 (eight) hours as needed for mild pain.    Marland Kitchen amLODipine (NORVASC) 10 MG tablet Take 10 mg by mouth daily. For blood pressure    . aspirin 325 MG tablet Take 325 mg by mouth daily. For stroke prevention with history of TIA    . atorvastatin (LIPITOR) 20 MG tablet Take 20 mg by mouth daily at 6 PM. For cholesterol  0  . calcitRIOL (ROCALTROL) 0.25 MCG capsule Take 0.25 mcg by mouth daily. For low calcium due to kidney disease    . carvedilol (COREG) 6.25 MG tablet Take 1 tablet (6.25 mg total) by mouth 2 (two) times daily with a meal. 60 tablet 0  . cloNIDine (CATAPRES) 0.1 MG  tablet Take 0.1 mg by mouth 3 (three) times daily. For blood pressure  0  . furosemide (LASIX) 80 MG tablet Take 2 tablets (160 mg total) by mouth 2 (two) times daily. For fluid removal with congestive heart failure 120 tablet 0  . hydrALAZINE (APRESOLINE) 25 MG tablet Take 25 mg by mouth 2 (two) times daily. With breakfast and supper. For blood pressure  0  . Insulin Detemir (LEVEMIR) 100 UNIT/ML Pen Inject 30 units with breakfast and 15 units before bedtime. 15 mL 11  . isosorbide mononitrate (IMDUR) 30 MG 24 hr tablet Take 1 tablet (30 mg total) by mouth daily. 30 tablet 0  . loratadine (CLARITIN) 10 MG tablet Take 10 mg by mouth daily. For allergies  0  . omeprazole (PRILOSEC) 40 MG capsule Take 40 mg by mouth daily. For reflux    . polyethylene glycol (MIRALAX / GLYCOLAX) packet Take 17 g by mouth every hour as needed (constipation).     No current facility-administered medications on file prior to visit.    Allergies  Allergen Reactions  . Penicillins Swelling and Rash    Recent Results (from the past 2160 hour(s))  Iron and TIBC     Status: Abnormal   Collection Time: 09/08/16  9:12 AM  Result Value Ref Range   Iron 83 28 - 170 ug/dL   TIBC 216 (L) 250 - 450 ug/dL   Saturation Ratios 38 (H) 10.4 - 31.8 %   UIBC 133 ug/dL  Ferritin     Status: Abnormal  Collection Time: 09/08/16  9:12 AM  Result Value Ref Range   Ferritin 739 (H) 11 - 307 ng/mL  Renal function panel     Status: Abnormal   Collection Time: 09/08/16  9:12 AM  Result Value Ref Range   Sodium 138 135 - 145 mmol/L   Potassium 3.6 3.5 - 5.1 mmol/L   Chloride 103 101 - 111 mmol/L   CO2 27 22 - 32 mmol/L   Glucose, Bld 152 (H) 65 - 99 mg/dL   BUN 43 (H) 6 - 20 mg/dL   Creatinine, Ser 2.29 (H) 0.44 - 1.00 mg/dL   Calcium 8.8 (L) 8.9 - 10.3 mg/dL   Phosphorus 4.0 2.5 - 4.6 mg/dL   Albumin 3.4 (L) 3.5 - 5.0 g/dL   GFR calc non Af Amer 19 (L) >60 mL/min   GFR calc Af Amer 22 (L) >60 mL/min    Comment:  (NOTE) The eGFR has been calculated using the CKD EPI equation. This calculation has not been validated in all clinical situations. eGFR's persistently <60 mL/min signify possible Chronic Kidney Disease.    Anion gap 8 5 - 15  Hemoglobin-hemacue, POC     Status: Abnormal   Collection Time: 09/08/16  9:12 AM  Result Value Ref Range   Hemoglobin 10.0 (L) 12.0 - 15.0 g/dL  CBG monitoring, ED     Status: Abnormal   Collection Time: 09/09/16  3:17 PM  Result Value Ref Range   Glucose-Capillary 291 (H) 65 - 99 mg/dL  Basic metabolic panel     Status: Abnormal   Collection Time: 09/09/16  3:21 PM  Result Value Ref Range   Sodium 134 (L) 135 - 145 mmol/L   Potassium 3.6 3.5 - 5.1 mmol/L   Chloride 100 (L) 101 - 111 mmol/L   CO2 25 22 - 32 mmol/L   Glucose, Bld 318 (H) 65 - 99 mg/dL   BUN 42 (H) 6 - 20 mg/dL   Creatinine, Ser 2.38 (H) 0.44 - 1.00 mg/dL   Calcium 8.9 8.9 - 10.3 mg/dL   GFR calc non Af Amer 18 (L) >60 mL/min   GFR calc Af Amer 21 (L) >60 mL/min    Comment: (NOTE) The eGFR has been calculated using the CKD EPI equation. This calculation has not been validated in all clinical situations. eGFR's persistently <60 mL/min signify possible Chronic Kidney Disease.    Anion gap 9 5 - 15  CBC     Status: Abnormal   Collection Time: 09/09/16  3:21 PM  Result Value Ref Range   WBC 7.8 4.0 - 10.5 K/uL   RBC 4.92 3.87 - 5.11 MIL/uL   Hemoglobin 11.0 (L) 12.0 - 15.0 g/dL   HCT 34.9 (L) 36.0 - 46.0 %   MCV 70.9 (L) 78.0 - 100.0 fL   MCH 22.4 (L) 26.0 - 34.0 pg   MCHC 31.5 30.0 - 36.0 g/dL   RDW 17.1 (H) 11.5 - 15.5 %   Platelets 257 150 - 400 K/uL  Urinalysis, Routine w reflex microscopic     Status: Abnormal   Collection Time: 09/09/16  5:35 PM  Result Value Ref Range   Color, Urine STRAW (A) YELLOW   APPearance CLEAR CLEAR   Specific Gravity, Urine 1.008 1.005 - 1.030   pH 6.0 5.0 - 8.0   Glucose, UA 150 (A) NEGATIVE mg/dL   Hgb urine dipstick SMALL (A) NEGATIVE    Bilirubin Urine NEGATIVE NEGATIVE   Ketones, ur NEGATIVE NEGATIVE mg/dL   Protein,  ur 100 (A) NEGATIVE mg/dL   Nitrite NEGATIVE NEGATIVE   Leukocytes, UA NEGATIVE NEGATIVE   RBC / HPF 0-5 0 - 5 RBC/hpf   WBC, UA 0-5 0 - 5 WBC/hpf   Bacteria, UA RARE (A) NONE SEEN   Squamous Epithelial / LPF 0-5 (A) NONE SEEN  CBG monitoring, ED     Status: Abnormal   Collection Time: 09/09/16  5:50 PM  Result Value Ref Range   Glucose-Capillary 247 (H) 65 - 99 mg/dL  PTH, intact and calcium     Status: Abnormal   Collection Time: 09/29/16  9:30 AM  Result Value Ref Range   PTH 248 (H) 15 - 65 pg/mL   Calcium, Total (PTH) 8.6 (L) 8.7 - 10.3 mg/dL   PTH Comment     Comment: (NOTE) Interpretation                 Intact PTH    Calcium                                (pg/mL)      (mg/dL) Normal                          15 - 65     8.6 - 10.2 Primary Hyperparathyroidism         >65          >10.2 Secondary Hyperparathyroidism       >65          <10.2 Non-Parathyroid Hypercalcemia       <65          >10.2 Hypoparathyroidism                  <15          < 8.6 Non-Parathyroid Hypocalcemia    15 - 65          < 8.6 Performed At: [facility] [place], [place] [id number] Lindon Romp MD [id number]   Hemoglobin-hemacue, POC     Status: Abnormal   Collection Time: 09/29/16  9:33 AM  Result Value Ref Range   Hemoglobin 10.4 (L) 12.0 - 15.0 g/dL  Iron and TIBC     Status: Abnormal   Collection Time: 11/04/16  9:25 AM  Result Value Ref Range   Iron 77 28 - 170 ug/dL   TIBC 206 (L) 250 - 450 ug/dL   Saturation Ratios 37 (H) 10.4 - 31.8 %   UIBC 129 ug/dL  Ferritin     Status: Abnormal   Collection Time: 11/04/16  9:25 AM  Result Value Ref Range   Ferritin 725 (H) 11 - 307 ng/mL  PTH, intact and calcium     Status: Abnormal   Collection Time: 11/04/16  9:25 AM  Result Value Ref Range   PTH 228 (H) 15 - 65 pg/mL   Calcium, Total (PTH) 8.7 8.7 - 10.3 mg/dL   PTH Comment      Comment: (NOTE) Interpretation                 Intact PTH    Calcium                                (pg/mL)      (mg/dL) Normal  15 - 65     8.6 - 10.2 Primary Hyperparathyroidism         >65          >10.2 Secondary Hyperparathyroidism       >65          <10.2 Non-Parathyroid Hypercalcemia       <65          >10.2 Hypoparathyroidism                  <15          < 8.6 Non-Parathyroid Hypocalcemia    15 - 65          < 8.6 Performed At: [facility] [place], [place] [id number] Lindon Romp MD [id number]   Renal function panel     Status: Abnormal   Collection Time: 11/04/16  9:25 AM  Result Value Ref Range   Sodium 138 135 - 145 mmol/L   Potassium 3.9 3.5 - 5.1 mmol/L    Comment: SLIGHT HEMOLYSIS   Chloride 103 101 - 111 mmol/L   CO2 26 22 - 32 mmol/L   Glucose, Bld 160 (H) 65 - 99 mg/dL   BUN 46 (H) 6 - 20 mg/dL   Creatinine, Ser 2.68 (H) 0.44 - 1.00 mg/dL   Calcium 8.7 (L) 8.9 - 10.3 mg/dL   Phosphorus 4.1 2.5 - 4.6 mg/dL   Albumin 3.5 3.5 - 5.0 g/dL   GFR calc non Af Amer 16 (L) >60 mL/min   GFR calc Af Amer 18 (L) >60 mL/min    Comment: (NOTE) The eGFR has been calculated using the CKD EPI equation. This calculation has not been validated in all clinical situations. eGFR's persistently <60 mL/min signify possible Chronic Kidney Disease.    Anion gap 9 5 - 15  Hemoglobin-hemacue, POC     Status: Abnormal   Collection Time: 11/04/16  9:26 AM  Result Value Ref Range   Hemoglobin 10.6 (L) 12.0 - 15.0 g/dL  Hemoglobin-hemacue, POC     Status: Abnormal   Collection Time: 11/25/16  8:53 AM  Result Value Ref Range   Hemoglobin 9.5 (L) 12.0 - 15.0 g/dL  Basic metabolic panel     Status: Abnormal   Collection Time: 11/26/16 11:36 AM  Result Value Ref Range   Sodium 134 (L) 135 - 145 mmol/L   Potassium 3.8 3.5 - 5.1 mmol/L   Chloride 97 (L) 101 - 111 mmol/L   CO2 27 22 - 32 mmol/L   Glucose, Bld 227 (H) 65 - 99  mg/dL   BUN 31 (H) 6 - 20 mg/dL   Creatinine, Ser 2.28 (H) 0.44 - 1.00 mg/dL   Calcium 8.7 (L) 8.9 - 10.3 mg/dL   GFR calc non Af Amer 19 (L) >60 mL/min   GFR calc Af Amer 22 (L) >60 mL/min    Comment: (NOTE) The eGFR has been calculated using the CKD EPI equation. This calculation has not been validated in all clinical situations. eGFR's persistently <60 mL/min signify possible Chronic Kidney Disease.    Anion gap 10 5 - 15  CBC     Status: Abnormal   Collection Time: 11/26/16 11:36 AM  Result Value Ref Range   WBC 8.8 4.0 - 10.5 K/uL   RBC 4.82 3.87 - 5.11 MIL/uL   Hemoglobin 10.5 (L) 12.0 - 15.0 g/dL   HCT 34.4 (L) 36.0 - 46.0 %   MCV 71.4 (L) 78.0 - 100.0 fL  MCH 21.8 (L) 26.0 - 34.0 pg   MCHC 30.5 30.0 - 36.0 g/dL   RDW 16.3 (H) 11.5 - 15.5 %   Platelets 261 150 - 400 K/uL  CBG monitoring, ED     Status: Abnormal   Collection Time: 11/26/16 12:18 PM  Result Value Ref Range   Glucose-Capillary 190 (H) 65 - 99 mg/dL  Urinalysis, Routine w reflex microscopic     Status: Abnormal   Collection Time: 11/26/16 12:57 PM  Result Value Ref Range   Color, Urine COLORLESS (A) YELLOW   APPearance CLEAR CLEAR   Specific Gravity, Urine 1.003 (L) 1.005 - 1.030   pH 7.0 5.0 - 8.0   Glucose, UA 50 (A) NEGATIVE mg/dL   Hgb urine dipstick NEGATIVE NEGATIVE   Bilirubin Urine NEGATIVE NEGATIVE   Ketones, ur NEGATIVE NEGATIVE mg/dL   Protein, ur NEGATIVE NEGATIVE mg/dL   Nitrite NEGATIVE NEGATIVE   Leukocytes, UA NEGATIVE NEGATIVE    Objective: General: Patient is awake, alert, and oriented x 3 and in no acute distress.  Integument: Skin is warm, dry and supple bilateral. Nails are tender, [place], thickened and dystrophic with subungual debris, consistent with onychomycosis, 1-5 bilateral. No signs of infection. No open lesions or preulcerative lesions present bilateral. Remaining integument unremarkable.  Vasculature:  Dorsalis Pedis pulse 2/4 bilateral. Posterior Tibial pulse 1/4  bilateral. Capillary fill time <3 sec 1-5 bilateral. Positive hair growth to the level of the digits.Temperature gradient within normal limits. No varicosities present bilateral. No edema present bilateral.   Neurology: The patient has intact sensation measured with a 5.07/10g Semmes Weinstein Monofilament at all pedal sites bilateral . Vibratory sensation intact bilateral with tuning fork. No Babinski sign present bilateral.   Musculoskeletal: Asymptomatic pes planus pedal deformities noted bilateral. Muscular strength 5/5 in all lower extremity muscular groups bilateral without pain on range of motion. No tenderness with calf compression bilateral.  Assessment and Plan: Problem List Items Addressed This Visit    None    Visit Diagnoses    Dermatophytosis of nail    -  Primary   Toe pain, bilateral       Diabetes mellitus without complication ([place])          -Examined patient. -Discussed and educated patient on diabetic foot care, especially with  regards to the vascular, neurological and musculoskeletal systems.  -Stressed the importance of good glycemic control and the detriment of not  controlling glucose levels in relation to the foot. -Mechanically debrided all nails 1-5 bilateral using sterile nail nipper and filed with dremel. Applied antibiotic cream at left 1st toenail for nic while trimming nail. -Answered all patient questions -Patient to return  in 3 months for at risk foot care -Patient advised to call the office if any problems or questions arise in the meantime.  Landis Martins, DPM

## 2016-12-16 ENCOUNTER — Encounter (HOSPITAL_COMMUNITY)
Admission: RE | Admit: 2016-12-16 | Discharge: 2016-12-16 | Disposition: A | Discharge status: Home / Self Care | Payer: Medicare Other | Source: Ambulatory Visit | Attending: Nephrology | Admitting: Nephrology

## 2016-12-16 DIAGNOSIS — N183 Chronic kidney disease, stage 3 unspecified: Secondary | ICD-10-CM

## 2016-12-16 DIAGNOSIS — D638 Anemia in other chronic diseases classified elsewhere: Secondary | ICD-10-CM | POA: Diagnosis not present

## 2016-12-16 LAB — RENAL FUNCTION PANEL
Albumin: 3.3 g/dL — ABNORMAL LOW (ref 3.5–5.0)
Anion gap: 10 (ref 5–15)
BUN: 39 mg/dL — ABNORMAL HIGH (ref 6–20)
CO2: 28 mmol/L (ref 22–32)
Calcium: 8.8 mg/dL — ABNORMAL LOW (ref 8.9–10.3)
Chloride: 101 mmol/L (ref 101–111)
Creatinine, Ser: 2.4 mg/dL — ABNORMAL HIGH (ref 0.44–1.00)
GFR calc Af Amer: 21 mL/min — ABNORMAL LOW (ref >60)
GFR calc non Af Amer: 18 mL/min — ABNORMAL LOW (ref >60)
Glucose, Bld: 239 mg/dL — ABNORMAL HIGH (ref 65–99)
Phosphorus: 4.3 mg/dL (ref 2.5–4.6)
Potassium: 3.4 mmol/L — ABNORMAL LOW (ref 3.5–5.1)
Sodium: 139 mmol/L (ref 135–145)

## 2016-12-16 LAB — IRON AND TIBC
Iron: 82 ug/dL (ref 28–170)
Saturation Ratios: 41 % — ABNORMAL HIGH (ref 10.4–31.8)
TIBC: 202 ug/dL — ABNORMAL LOW (ref 250–450)
UIBC: 120 ug/dL

## 2016-12-16 LAB — FERRITIN: Ferritin: 806 ng/mL — ABNORMAL HIGH (ref 11–307)

## 2016-12-16 LAB — POCT HEMOGLOBIN-HEMACUE: Hemoglobin: 10.5 g/dL — ABNORMAL LOW (ref 12.0–15.0)

## 2016-12-16 MED ORDER — EPOETIN ALFA 10000 UNIT/ML IJ SOLN: 20000.0000 [IU] | INTRAMUSCULAR | Status: DC

## 2016-12-16 MED ORDER — EPOETIN ALFA 20000 UNIT/ML IJ SOLN
INTRAMUSCULAR | Status: AC
  Administered 2016-12-16: 20000 [IU] via SUBCUTANEOUS
  Filled 2016-12-16: qty 1

## 2016-12-17 LAB — PTH, INTACT AND CALCIUM
Calcium, Total (PTH): 8.5 mg/dL — ABNORMAL LOW (ref 8.7–10.3)
PTH: 200 pg/mL — ABNORMAL HIGH (ref 15–65)

## 2017-01-06 ENCOUNTER — Encounter (HOSPITAL_COMMUNITY)
Admission: RE | Admit: 2017-01-06 | Discharge: 2017-01-06 | Disposition: A | Discharge status: Home / Self Care | Payer: Medicare Other | Source: Ambulatory Visit | Attending: Nephrology | Admitting: Nephrology

## 2017-01-06 DIAGNOSIS — N183 Chronic kidney disease, stage 3 unspecified: Secondary | ICD-10-CM

## 2017-01-06 DIAGNOSIS — D638 Anemia in other chronic diseases classified elsewhere: Secondary | ICD-10-CM | POA: Diagnosis not present

## 2017-01-06 LAB — POCT HEMOGLOBIN-HEMACUE: Hemoglobin: 10.2 g/dL — ABNORMAL LOW (ref 12.0–15.0)

## 2017-01-06 MED ORDER — EPOETIN ALFA 10000 UNIT/ML IJ SOLN: 20000.0000 [IU] | INTRAMUSCULAR | Status: DC

## 2017-01-06 MED ORDER — EPOETIN ALFA 20000 UNIT/ML IJ SOLN
INTRAMUSCULAR | Status: AC
  Administered 2017-01-06: 20000 [IU]
  Filled 2017-01-06: qty 1

## 2017-01-27 ENCOUNTER — Encounter (HOSPITAL_COMMUNITY)
Admission: RE | Admit: 2017-01-27 | Discharge: 2017-01-27 | Disposition: A | Discharge status: Home / Self Care | Payer: Medicare Other | Source: Ambulatory Visit | Attending: Nephrology | Admitting: Nephrology

## 2017-01-27 DIAGNOSIS — D638 Anemia in other chronic diseases classified elsewhere: Secondary | ICD-10-CM | POA: Diagnosis not present

## 2017-01-27 DIAGNOSIS — N183 Chronic kidney disease, stage 3 unspecified: Secondary | ICD-10-CM

## 2017-01-27 LAB — RENAL FUNCTION PANEL
Albumin: 3.7 g/dL (ref 3.5–5.0)
Anion gap: 9 (ref 5–15)
BUN: 33 mg/dL — ABNORMAL HIGH (ref 6–20)
CO2: 27 mmol/L (ref 22–32)
Calcium: 8.9 mg/dL (ref 8.9–10.3)
Chloride: 100 mmol/L — ABNORMAL LOW (ref 101–111)
Creatinine, Ser: 2.32 mg/dL — ABNORMAL HIGH (ref 0.44–1.00)
GFR calc Af Amer: 22 mL/min — ABNORMAL LOW (ref >60)
GFR calc non Af Amer: 19 mL/min — ABNORMAL LOW (ref >60)
Glucose, Bld: 275 mg/dL — ABNORMAL HIGH (ref 65–99)
Phosphorus: 3.9 mg/dL (ref 2.5–4.6)
Potassium: 3.5 mmol/L (ref 3.5–5.1)
Sodium: 136 mmol/L (ref 135–145)

## 2017-01-27 LAB — IRON AND TIBC
Iron: 66 ug/dL (ref 28–170)
Saturation Ratios: 30 % (ref 10.4–31.8)
TIBC: 221 ug/dL — ABNORMAL LOW (ref 250–450)
UIBC: 155 ug/dL

## 2017-01-27 LAB — POCT HEMOGLOBIN-HEMACUE: Hemoglobin: 11.2 g/dL — ABNORMAL LOW (ref 12.0–15.0)

## 2017-01-27 LAB — FERRITIN: Ferritin: 757 ng/mL — ABNORMAL HIGH (ref 11–307)

## 2017-01-27 MED ORDER — CLONIDINE HCL 0.1 MG PO TABS
0.1000 mg | ORAL_TABLET | Freq: Once | ORAL | Status: AC | PRN
  Administered 2017-01-27: 0.1 mg via ORAL

## 2017-01-27 MED ORDER — CLONIDINE HCL 0.1 MG PO TABS
ORAL_TABLET | ORAL | Status: AC
  Filled 2017-01-27: qty 1

## 2017-01-27 MED ORDER — EPOETIN ALFA 10000 UNIT/ML IJ SOLN: 20000.0000 [IU] | INTRAMUSCULAR | Status: DC

## 2017-01-28 LAB — PTH, INTACT AND CALCIUM
Calcium, Total (PTH): 9 mg/dL (ref 8.7–10.3)
PTH: 214 pg/mL — ABNORMAL HIGH (ref 15–65)

## 2017-02-15 DIAGNOSIS — E1122 Type 2 diabetes mellitus with diabetic chronic kidney disease: Secondary | ICD-10-CM | POA: Diagnosis not present

## 2017-02-15 DIAGNOSIS — N08 Glomerular disorders in diseases classified elsewhere: Secondary | ICD-10-CM | POA: Diagnosis not present

## 2017-02-15 DIAGNOSIS — I130 Hypertensive heart and chronic kidney disease with heart failure and stage 1 through stage 4 chronic kidney disease, or unspecified chronic kidney disease: Secondary | ICD-10-CM | POA: Diagnosis not present

## 2017-02-15 DIAGNOSIS — N184 Chronic kidney disease, stage 4 (severe): Secondary | ICD-10-CM | POA: Diagnosis not present

## 2017-03-14 ENCOUNTER — Ambulatory Visit (INDEPENDENT_AMBULATORY_CARE_PROVIDER_SITE_OTHER): Payer: Medicare Other | Admitting: Sports Medicine

## 2017-03-14 ENCOUNTER — Encounter: Payer: Self-pay | Admitting: Sports Medicine

## 2017-03-14 DIAGNOSIS — B351 Tinea unguium: Secondary | ICD-10-CM | POA: Diagnosis not present

## 2017-03-14 DIAGNOSIS — E119 Type 2 diabetes mellitus without complications: Secondary | ICD-10-CM

## 2017-03-14 DIAGNOSIS — M79674 Pain in right toe(s): Secondary | ICD-10-CM

## 2017-03-14 DIAGNOSIS — M79675 Pain in left toe(s): Secondary | ICD-10-CM

## 2017-03-14 NOTE — Progress Notes (Signed)
Subjective: [name] [name] is a 81 y.o. female patient with history of diabetes who presents to office today complaining of [place], painful nails  while ambulating in shoes; unable to trim. Patient states that the glucose reading this morning was 200 mg/dl. Patient denies any new changes in medication or new problems.  Patient Active Problem List   Diagnosis Date Noted  . Acute on chronic diastolic CHF (congestive heart failure) ([place]) 05/26/2015  . Type 2 diabetes mellitus with nephropathy ([place]) 05/26/2015  . CHF (congestive heart failure) ([place]) 05/25/2015  . Anasarca 05/25/2015  . DM (diabetes mellitus), type 2 with renal complications ([place]) 02/77/4128  . Chronic anemia 05/25/2015  . Hypocalcemia   . Encephalopathy, hypertensive 06/11/2014  . Essential hypertension 06/11/2014  . Type 2 diabetes mellitus without complication ([place]) 78/67/6720  . HLD (hyperlipidemia) 06/11/2014  . OSA (obstructive sleep apnea) 03/06/2014  . Chronic kidney disease 01/13/2014  . TIA (transient ischemic attack) 01/11/2014  . Hypertensive urgency 01/11/2014  . Diabetes mellitus ([name]) 01/11/2014   Current Outpatient Prescriptions on File Prior to Visit  Medication Sig Dispense Refill  . acetaminophen (TYLENOL) 500 MG tablet Take 1,000 mg by mouth every 8 (eight) hours as needed for mild pain.    Marland Kitchen amLODipine (NORVASC) 10 MG tablet Take 10 mg by mouth daily. For blood pressure    . aspirin 325 MG tablet Take 325 mg by mouth daily. For stroke prevention with history of TIA    . atorvastatin (LIPITOR) 20 MG tablet Take 20 mg by mouth daily at 6 PM. For cholesterol  0  . calcitRIOL (ROCALTROL) 0.25 MCG capsule Take 0.25 mcg by mouth daily. For low calcium due to kidney disease    . carvedilol (COREG) 6.25 MG tablet Take 1 tablet (6.25 mg total) by mouth 2 (two) times daily with a meal. 60 tablet 0  . cloNIDine (CATAPRES) 0.1 MG tablet Take 0.1 mg by mouth 3 (three) times daily. For blood pressure  0  .  furosemide (LASIX) 80 MG tablet Take 2 tablets (160 mg total) by mouth 2 (two) times daily. For fluid removal with congestive heart failure 120 tablet 0  . hydrALAZINE (APRESOLINE) 25 MG tablet Take 25 mg by mouth 2 (two) times daily. With breakfast and supper. For blood pressure  0  . Insulin Detemir (LEVEMIR) 100 UNIT/ML Pen Inject 30 units with breakfast and 15 units before bedtime. 15 mL 11  . isosorbide mononitrate (IMDUR) 30 MG 24 hr tablet Take 1 tablet (30 mg total) by mouth daily. 30 tablet 0  . loratadine (CLARITIN) 10 MG tablet Take 10 mg by mouth daily. For allergies  0  . omeprazole (PRILOSEC) 40 MG capsule Take 40 mg by mouth daily. For reflux    . polyethylene glycol (MIRALAX / GLYCOLAX) packet Take 17 g by mouth every hour as needed (constipation).     No current facility-administered medications on file prior to visit.    Allergies  Allergen Reactions  . Penicillins Swelling and Rash    Recent Results (from the past 2160 hour(s))  Iron and TIBC     Status: Abnormal   Collection Time: 12/16/16  8:44 AM  Result Value Ref Range   Iron 82 28 - 170 ug/dL   TIBC 202 (L) 250 - 450 ug/dL   Saturation Ratios 41 (H) 10.4 - 31.8 %   UIBC 120 ug/dL  Ferritin     Status: Abnormal   Collection Time: 12/16/16  8:44 AM  Result Value Ref  Range   Ferritin 806 (H) 11 - 307 ng/mL  PTH, intact and calcium     Status: Abnormal   Collection Time: 12/16/16  8:44 AM  Result Value Ref Range   PTH 200 (H) 15 - 65 pg/mL   Calcium, Total (PTH) 8.5 (L) 8.7 - 10.3 mg/dL   PTH Interp Comment     Comment: (NOTE) Interpretation                 Intact PTH    Calcium                                (pg/mL)      (mg/dL) Normal                          15 - 65     8.6 - 10.2 Primary Hyperparathyroidism         >65          >10.2 Secondary Hyperparathyroidism       >65          <10.2 Non-Parathyroid Hypercalcemia       <65          >10.2 Hypoparathyroidism                  <15          <  8.6 Non-Parathyroid Hypocalcemia    15 - 65          < 8.6 Performed At: [facility] [place], [place] [id number] Lindon Romp MD [id number]   Renal function panel     Status: Abnormal   Collection Time: 12/16/16  8:44 AM  Result Value Ref Range   Sodium 139 135 - 145 mmol/L   Potassium 3.4 (L) 3.5 - 5.1 mmol/L   Chloride 101 101 - 111 mmol/L   CO2 28 22 - 32 mmol/L   Glucose, Bld 239 (H) 65 - 99 mg/dL   BUN 39 (H) 6 - 20 mg/dL   Creatinine, Ser 2.40 (H) 0.44 - 1.00 mg/dL   Calcium 8.8 (L) 8.9 - 10.3 mg/dL   Phosphorus 4.3 2.5 - 4.6 mg/dL   Albumin 3.3 (L) 3.5 - 5.0 g/dL   GFR calc non Af Amer 18 (L) >60 mL/min   GFR calc Af Amer 21 (L) >60 mL/min    Comment: (NOTE) The eGFR has been calculated using the CKD EPI equation. This calculation has not been validated in all clinical situations. eGFR's persistently <60 mL/min signify possible Chronic Kidney Disease.    Anion gap 10 5 - 15  Hemoglobin-hemacue, POC     Status: Abnormal   Collection Time: 12/16/16  8:49 AM  Result Value Ref Range   Hemoglobin 10.5 (L) 12.0 - 15.0 g/dL  Hemoglobin-hemacue, POC     Status: Abnormal   Collection Time: 01/06/17  9:16 AM  Result Value Ref Range   Hemoglobin 10.2 (L) 12.0 - 15.0 g/dL  Iron and TIBC     Status: Abnormal   Collection Time: 01/27/17  9:11 AM  Result Value Ref Range   Iron 66 28 - 170 ug/dL   TIBC 221 (L) 250 - 450 ug/dL   Saturation Ratios 30 10.4 - 31.8 %   UIBC 155 ug/dL  Ferritin     Status: Abnormal   Collection Time: 01/27/17  9:11 AM  Result Value Ref Range  Ferritin 757 (H) 11 - 307 ng/mL  PTH, intact and calcium     Status: Abnormal   Collection Time: 01/27/17  9:11 AM  Result Value Ref Range   PTH 214 (H) 15 - 65 pg/mL   Calcium, Total (PTH) 9.0 8.7 - 10.3 mg/dL   PTH Interp Comment     Comment: (NOTE) Interpretation                 Intact PTH    Calcium                                (pg/mL)      (mg/dL) Normal                           15 - 65     8.6 - 10.2 Primary Hyperparathyroidism         >65          >10.2 Secondary Hyperparathyroidism       >65          <10.2 Non-Parathyroid Hypercalcemia       <65          >10.2 Hypoparathyroidism                  <15          < 8.6 Non-Parathyroid Hypocalcemia    15 - 65          < 8.6 Performed At: [facility] [place], [place] [id number] Lindon Romp MD [id number]:[id number]   Renal function panel     Status: Abnormal   Collection Time: 01/27/17  9:11 AM  Result Value Ref Range   Sodium 136 135 - 145 mmol/L   Potassium 3.5 3.5 - 5.1 mmol/L   Chloride 100 (L) 101 - 111 mmol/L   CO2 27 22 - 32 mmol/L   Glucose, Bld 275 (H) 65 - 99 mg/dL   BUN 33 (H) 6 - 20 mg/dL   Creatinine, Ser 2.32 (H) 0.44 - 1.00 mg/dL   Calcium 8.9 8.9 - 10.3 mg/dL   Phosphorus 3.9 2.5 - 4.6 mg/dL   Albumin 3.7 3.5 - 5.0 g/dL   GFR calc non Af Amer 19 (L) >60 mL/min   GFR calc Af Amer 22 (L) >60 mL/min    Comment: (NOTE) The eGFR has been calculated using the CKD EPI equation. This calculation has not been validated in all clinical situations. eGFR's persistently <60 mL/min signify possible Chronic Kidney Disease.    Anion gap 9 5 - 15  Hemoglobin-hemacue, POC     Status: Abnormal   Collection Time: 01/27/17  9:30 AM  Result Value Ref Range   Hemoglobin 11.2 (L) 12.0 - 15.0 g/dL    Objective: General: Patient is awake, alert, and oriented x 3 and in no acute distress.  Integument: Skin is warm, dry and supple bilateral. Nails are tender, Zerkle, thickened and dystrophic with subungual debris, consistent with onychomycosis, 1-5 bilateral. No signs of infection. No open lesions or preulcerative lesions present bilateral. Remaining integument unremarkable.  Vasculature:  Dorsalis Pedis pulse 2/4 bilateral. Posterior Tibial pulse 1/4 bilateral. Capillary fill time <3 sec 1-5 bilateral. Positive hair growth to the level of the digits.Temperature gradient within  normal limits. No varicosities present bilateral. No edema present bilateral.   Neurology: The patient has intact sensation measured with a 5.07/10g Semmes  Weinstein Monofilament at all pedal sites bilateral . Vibratory sensation intact bilateral with tuning fork. No Babinski sign present bilateral.   Musculoskeletal: Asymptomatic pes planus pedal deformities noted bilateral. Muscular strength 5/5 in all lower extremity muscular groups bilateral without pain on range of motion. No tenderness with calf compression bilateral.  Assessment and Plan: Problem List Items Addressed This Visit    None    Visit Diagnoses    Dermatophytosis of nail    -  Primary   Toe pain, bilateral       Diabetes mellitus without complication ([place])         -Examined patient. -Discussed and educated patient on diabetic foot care, especially with  regards to the vascular, neurological and musculoskeletal systems.  -Stressed the importance of good glycemic control and the detriment of not  controlling glucose levels in relation to the foot. -Mechanically debrided all nails 1-5 bilateral using sterile nail nipper and filed with dremel. Applied antibiotic cream at left 1st toenail for nic while trimming nail. -Answered all patient questions -Patient to return  in 3 months for at risk foot care -Patient advised to call the office if any problems or questions arise in the meantime.  Landis Martins, DPM

## 2017-03-20 DIAGNOSIS — I129 Hypertensive chronic kidney disease with stage 1 through stage 4 chronic kidney disease, or unspecified chronic kidney disease: Secondary | ICD-10-CM | POA: Diagnosis not present

## 2017-03-20 DIAGNOSIS — D631 Anemia in chronic kidney disease: Secondary | ICD-10-CM | POA: Diagnosis not present

## 2017-03-20 DIAGNOSIS — N2581 Secondary hyperparathyroidism of renal origin: Secondary | ICD-10-CM | POA: Diagnosis not present

## 2017-03-20 DIAGNOSIS — E1129 Type 2 diabetes mellitus with other diabetic kidney complication: Secondary | ICD-10-CM | POA: Diagnosis not present

## 2017-03-20 DIAGNOSIS — N183 Chronic kidney disease, stage 3 (moderate): Secondary | ICD-10-CM | POA: Diagnosis not present

## 2017-03-31 ENCOUNTER — Other Ambulatory Visit (HOSPITAL_COMMUNITY): Payer: Self-pay | Admitting: *Deleted

## 2017-03-31 DIAGNOSIS — N183 Chronic kidney disease, stage 3 (moderate): Secondary | ICD-10-CM | POA: Diagnosis not present

## 2017-03-31 DIAGNOSIS — I509 Heart failure, unspecified: Secondary | ICD-10-CM | POA: Insufficient documentation

## 2017-03-31 DIAGNOSIS — Z79899 Other long term (current) drug therapy: Secondary | ICD-10-CM | POA: Diagnosis not present

## 2017-03-31 DIAGNOSIS — E785 Hyperlipidemia, unspecified: Secondary | ICD-10-CM | POA: Diagnosis not present

## 2017-03-31 DIAGNOSIS — E1140 Type 2 diabetes mellitus with diabetic neuropathy, unspecified: Secondary | ICD-10-CM | POA: Insufficient documentation

## 2017-03-31 DIAGNOSIS — E11649 Type 2 diabetes mellitus with hypoglycemia without coma: Secondary | ICD-10-CM | POA: Diagnosis not present

## 2017-03-31 DIAGNOSIS — E162 Hypoglycemia, unspecified: Secondary | ICD-10-CM | POA: Insufficient documentation

## 2017-03-31 DIAGNOSIS — Z794 Long term (current) use of insulin: Secondary | ICD-10-CM | POA: Insufficient documentation

## 2017-03-31 DIAGNOSIS — Z8673 Personal history of transient ischemic attack (TIA), and cerebral infarction without residual deficits: Secondary | ICD-10-CM | POA: Diagnosis not present

## 2017-03-31 DIAGNOSIS — I130 Hypertensive heart and chronic kidney disease with heart failure and stage 1 through stage 4 chronic kidney disease, or unspecified chronic kidney disease: Secondary | ICD-10-CM | POA: Diagnosis not present

## 2017-03-31 DIAGNOSIS — Z7982 Long term (current) use of aspirin: Secondary | ICD-10-CM | POA: Insufficient documentation

## 2017-03-31 DIAGNOSIS — R7309 Other abnormal glucose: Secondary | ICD-10-CM | POA: Diagnosis present

## 2017-03-31 LAB — CBC
HCT: 30.5 % — ABNORMAL LOW (ref 36.0–46.0)
Hemoglobin: 9.6 g/dL — ABNORMAL LOW (ref 12.0–15.0)
MCH: 21.7 pg — ABNORMAL LOW (ref 26.0–34.0)
MCHC: 31.5 g/dL (ref 30.0–36.0)
MCV: 69 fL — ABNORMAL LOW (ref 78.0–100.0)
Platelets: 270 10*3/uL (ref 150–400)
RBC: 4.42 MIL/uL (ref 3.87–5.11)
RDW: 15.9 % — ABNORMAL HIGH (ref 11.5–15.5)
WBC: 7.8 10*3/uL (ref 4.0–10.5)

## 2017-03-31 LAB — BASIC METABOLIC PANEL
Anion gap: 11 (ref 5–15)
BUN: 41 mg/dL — ABNORMAL HIGH (ref 6–20)
CO2: 26 mmol/L (ref 22–32)
Calcium: 8.9 mg/dL (ref 8.9–10.3)
Chloride: 98 mmol/L — ABNORMAL LOW (ref 101–111)
Creatinine, Ser: 2.8 mg/dL — ABNORMAL HIGH (ref 0.44–1.00)
GFR calc Af Amer: 17 mL/min — ABNORMAL LOW (ref >60)
GFR calc non Af Amer: 15 mL/min — ABNORMAL LOW (ref >60)
Glucose, Bld: 246 mg/dL — ABNORMAL HIGH (ref 65–99)
Potassium: 3.6 mmol/L (ref 3.5–5.1)
Sodium: 135 mmol/L (ref 135–145)

## 2017-03-31 LAB — CBG MONITORING, ED
Glucose-Capillary: 202 mg/dL — ABNORMAL HIGH (ref 65–99)
Glucose-Capillary: 211 mg/dL — ABNORMAL HIGH (ref 65–99)
Glucose-Capillary: 300 mg/dL — ABNORMAL HIGH (ref 65–99)

## 2017-03-31 NOTE — ED Notes (Signed)
Patient eating [employer] in lobby

## 2017-03-31 NOTE — ED Notes (Addendum)
Patient's family states she is feeling drowsy again and would like to have her blood sugar checked

## 2017-03-31 NOTE — ED Triage Notes (Signed)
Pt reports CBG low as 29 today, had some juice and spoonful of jelly. CBG 211 in triage. States yesterday it was high as 400. States she doesn't feel well, but not elaborating on s/s.

## 2017-04-01 ENCOUNTER — Emergency Department (HOSPITAL_COMMUNITY)
Admission: EM | Admit: 2017-04-01 | Discharge: 2017-04-01 | Disposition: A | Discharge status: Home / Self Care | Payer: Medicare Other | Attending: Physician Assistant | Admitting: Physician Assistant

## 2017-04-01 DIAGNOSIS — E162 Hypoglycemia, unspecified: Secondary | ICD-10-CM

## 2017-04-01 LAB — CBG MONITORING, ED
Glucose-Capillary: 232 mg/dL — ABNORMAL HIGH (ref 65–99)
Glucose-Capillary: 269 mg/dL — ABNORMAL HIGH (ref 65–99)

## 2017-04-01 MED ORDER — GLUCOSE BLOOD VI STRP: ORAL_STRIP | 12 refills | Status: DC

## 2017-04-01 MED ORDER — DOCUSATE SODIUM 100 MG PO CAPS: 100.0000 mg | ORAL_CAPSULE | Freq: Every day | ORAL | 0 refills | Status: DC

## 2017-04-01 MED ORDER — BLOOD GLUCOSE MONITOR KIT: PACK | 0 refills | Status: DC

## 2017-04-01 NOTE — Discharge Instructions (Signed)
We will need to decrease her morning Levemir to 15. Please record your sugars 5-6 times per day and then follow up with her primary care physician for further advice in the next several days.

## 2017-04-01 NOTE — ED Notes (Signed)
Patient family member updated on delays

## 2017-04-01 NOTE — ED Provider Notes (Addendum)
[facility] DEPT Provider Note   CSN: [id number] Arrival date & time: 03/31/17  2112     History   Chief Complaint Chief Complaint  Patient presents with  . Hypoglycemia    HPI Delanda Bulluck Hallums is a 81 y.o. female.  HPI  Patient is a 81 year old female presenting with low blood sugars. Patient reports that she takes Levemir 30 in the morning and 15 in the afternoon. Patient reports that yesterday her blood sugar was 79 8 PM. Today patient's blood sugar was 29 at the same time. Patient had recent decrease in her afternoon Levemir by her nephrologiest because she had been running low. . Patient had not taken her afternoon Levemir in either of these low blood sugar cases.  Past Medical History:  Diagnosis Date  . Arthritis    RA  . CHF (congestive heart failure) ([place])   . CKD (chronic kidney disease) stage 3, GFR 30-59 ml/min   . Diabetes mellitus without complication (HCC)    insulin dependent  . Hyperlipidemia   . Hypertension   . Shortness of breath dyspnea   . Sleep apnea    cpap  . TIA (transient ischemic attack) 2015    Patient Active Problem List   Diagnosis Date Noted  . Acute on chronic diastolic CHF (congestive heart failure) ([place]) 05/26/2015  . Type 2 diabetes mellitus with nephropathy ([place]) 05/26/2015  . CHF (congestive heart failure) ([place]) 05/25/2015  . Anasarca 05/25/2015  . DM (diabetes mellitus), type 2 with renal complications ([place]) 53/29/9242  . Chronic anemia 05/25/2015  . Hypocalcemia   . Encephalopathy, hypertensive 06/11/2014  . Essential hypertension 06/11/2014  . Type 2 diabetes mellitus without complication ([place]) 68/34/1962  . HLD (hyperlipidemia) 06/11/2014  . OSA (obstructive sleep apnea) 03/06/2014  . Chronic kidney disease 01/13/2014  . TIA (transient ischemic attack) 01/11/2014  . Hypertensive urgency 01/11/2014  . Diabetes mellitus ([place]) 01/11/2014    Past Surgical History:  Procedure Laterality Date  . ABDOMINAL  HYSTERECTOMY  1974    OB History    No data available       Home Medications    Prior to Admission medications   Medication Sig Start Date End Date Taking? Authorizing Provider  acetaminophen (TYLENOL) 500 MG tablet Take 1,000 mg by mouth every 8 (eight) hours as needed for mild pain.    [provider]  amLODipine (NORVASC) 10 MG tablet Take 10 mg by mouth daily. For blood pressure    [provider]  aspirin 325 MG tablet Take 325 mg by mouth daily. For stroke prevention with history of TIA    [provider]  atorvastatin (LIPITOR) 20 MG tablet Take 20 mg by mouth daily at 6 PM. For cholesterol 06/03/14   [provider]  calcitRIOL (ROCALTROL) 0.25 MCG capsule Take 0.25 mcg by mouth daily. For low calcium due to kidney disease 06/26/12   [provider]  carvedilol (COREG) 6.25 MG tablet Take 1 tablet (6.25 mg total) by mouth 2 (two) times daily with a meal. 06/03/15   Verlee Monte, MD  cloNIDine (CATAPRES) 0.1 MG tablet Take 0.1 mg by mouth 3 (three) times daily. For blood pressure 05/30/14   [provider]  furosemide (LASIX) 80 MG tablet Take 2 tablets (160 mg total) by mouth 2 (two) times daily. For fluid removal with congestive heart failure 06/03/15   Verlee Monte, MD  hydrALAZINE (APRESOLINE) 25 MG tablet Take 25 mg by mouth 2 (two) times daily. With breakfast and supper.  For blood pressure 05/30/14   [provider]  Insulin Detemir (LEVEMIR) 100 UNIT/ML Pen Inject 30 units with breakfast and 15 units before bedtime. 06/03/15   Verlee Monte, MD  isosorbide mononitrate (IMDUR) 30 MG 24 hr tablet Take 1 tablet (30 mg total) by mouth daily. 06/03/15   Verlee Monte, MD  loratadine (CLARITIN) 10 MG tablet Take 10 mg by mouth daily. For allergies 05/19/14   [provider]  omeprazole (PRILOSEC) 40 MG capsule Take 40 mg by mouth daily. For reflux    [provider]  polyethylene glycol (MIRALAX /  GLYCOLAX) packet Take 17 g by mouth every hour as needed (constipation).    [provider]    Family History Family History  Problem Relation Age of Onset  . Diabetes type II Mother   . Hypertension Mother   . Diabetes type II Father   . Hypertension Other   . Hyperlipidemia Other   . Stroke Other     Social History Social History  Substance Use Topics  . Smoking status: Never Smoker  . Smokeless tobacco: Never Used  . Alcohol use No     Allergies   Penicillins   Review of Systems Review of Systems  Constitutional: Negative for activity change.  Respiratory: Negative for shortness of breath.   Cardiovascular: Negative for chest pain.  Gastrointestinal: Negative for abdominal pain.     Physical Exam Updated Vital Signs BP (!) 170/83   Pulse 88   Temp 98.5 F (36.9 C) (Oral)   Resp 20   Ht 5\' 1"  (1.549 m)   Wt 82.6 kg (182 lb)   SpO2 99%   BMI 34.39 kg/m   Physical Exam  Constitutional: She is oriented to person, place, and time. She appears well-developed and well-nourished.  HENT:  Head: Normocephalic and atraumatic.  Eyes: Right eye exhibits no discharge.  Cardiovascular: Normal rate, regular rhythm and normal heart sounds.   No murmur heard. Pulmonary/Chest: Effort normal and breath sounds normal. She has no wheezes. She has no rales.  Abdominal: Soft. She exhibits no distension. There is no tenderness.  Neurological: She is oriented to person, place, and time.  Skin: Skin is warm and dry. She is not diaphoretic.  Psychiatric: She has a normal mood and affect.  Nursing note and vitals reviewed.    ED Treatments / Results  Labs (all labs ordered are listed, but only abnormal results are displayed) Labs Reviewed  CBC - Abnormal; Notable for the following:       Result Value   Hemoglobin 9.6 (*)    HCT 30.5 (*)    MCV 69.0 (*)    MCH 21.7 (*)    RDW 15.9 (*)    All other components within normal limits  BASIC METABOLIC PANEL -  Abnormal; Notable for the following:    Chloride 98 (*)    Glucose, Bld 246 (*)    BUN 41 (*)    Creatinine, Ser 2.80 (*)    GFR calc non Af Amer 15 (*)    GFR calc Af Amer 17 (*)    All other components within normal limits  CBG MONITORING, ED - Abnormal; Notable for the following:    Glucose-Capillary 211 (*)    All other components within normal limits  CBG MONITORING, ED - Abnormal; Notable for the following:    Glucose-Capillary 202 (*)    All other components within normal limits  CBG MONITORING, ED - Abnormal; Notable for the following:  Glucose-Capillary 300 (*)    All other components within normal limits  CBG MONITORING, ED - Abnormal; Notable for the following:    Glucose-Capillary 269 (*)    All other components within normal limits  CBG MONITORING, ED - Abnormal; Notable for the following:    Glucose-Capillary 232 (*)    All other components within normal limits    EKG  EKG Interpretation None       Radiology No results found.  Procedures Procedures (including critical care time)  Medications Ordered in ED Medications - No data to display   Initial Impression / Assessment and Plan / ED Course  I have reviewed the triage vital signs and the nursing notes.  Pertinent labs & imaging results that were available during my care of the patient were reviewed by me and considered in my medical decision making (see chart for details).     Patient is a 81 year old female presenting with low blood sugars. Patient reports that she takes Levemir 30 in the morning and 15 in the afternoon. Patient reports that yesterday her blood sugar was 79 8 PM. Today patient's blood sugar was 29 at the same time. Patient had recent decrease in her afternoon Levemir by her nephrologiest because she had been running low. . Patient had not taken her afternoon Levemir in either of these low blood sugar cases. I'm concerned that her morning Levemir infection causing her to bottom out.  We will decrease her Levemir in the morning and have her follow-up with her primary care and nephrologist. Patient reports that she knows that she has anemia and has been treating it with primary care physician. In addition patient would like something to help with her constipation..  Final Clinical Impressions(s) / ED Diagnoses   Final diagnoses:  None    New Prescriptions New Prescriptions   No medications on file     Macarthur Critchley, MD 04/01/17 [place]    Macarthur Critchley, MD 04/01/17 [phone]

## 2017-04-01 NOTE — ED Notes (Signed)
Patient's family asking for a vitals and CBG reassess.  Vitals obtained, CBG obtained.  Family stating they have not had much sleep and are considering going home.  This RN made them aware how close patient is to rooming and asked them to reconsider.  Patient and family returned to waiting at this time.  States they will tell RN if they decide to leave.

## 2017-04-03 ENCOUNTER — Ambulatory Visit (HOSPITAL_COMMUNITY)
Admission: RE | Admit: 2017-04-03 | Discharge: 2017-04-03 | Disposition: A | Discharge status: Home / Self Care | Payer: Medicare Other | Source: Ambulatory Visit | Attending: Nephrology | Admitting: Nephrology

## 2017-04-03 DIAGNOSIS — D631 Anemia in chronic kidney disease: Secondary | ICD-10-CM | POA: Diagnosis not present

## 2017-04-03 DIAGNOSIS — N183 Chronic kidney disease, stage 3 unspecified: Secondary | ICD-10-CM

## 2017-04-03 LAB — RENAL FUNCTION PANEL
Albumin: 3.6 g/dL (ref 3.5–5.0)
Anion gap: 11 (ref 5–15)
BUN: 46 mg/dL — ABNORMAL HIGH (ref 6–20)
CO2: 28 mmol/L (ref 22–32)
Calcium: 9.1 mg/dL (ref 8.9–10.3)
Chloride: 99 mmol/L — ABNORMAL LOW (ref 101–111)
Creatinine, Ser: 2.62 mg/dL — ABNORMAL HIGH (ref 0.44–1.00)
GFR calc Af Amer: 19 mL/min — ABNORMAL LOW (ref >60)
GFR calc non Af Amer: 16 mL/min — ABNORMAL LOW (ref >60)
Glucose, Bld: 252 mg/dL — ABNORMAL HIGH (ref 65–99)
Phosphorus: 4.1 mg/dL (ref 2.5–4.6)
Potassium: 3.9 mmol/L (ref 3.5–5.1)
Sodium: 138 mmol/L (ref 135–145)

## 2017-04-03 LAB — POCT HEMOGLOBIN-HEMACUE: Hemoglobin: 9.6 g/dL — ABNORMAL LOW (ref 12.0–15.0)

## 2017-04-03 MED ORDER — EPOETIN ALFA 10000 UNIT/ML IJ SOLN: 20000.0000 [IU] | INTRAMUSCULAR | Status: DC

## 2017-04-03 MED ORDER — FERUMOXYTOL INJECTION 510 MG/17 ML
510.0000 mg | INTRAVENOUS | Status: DC
  Administered 2017-04-03: 510 mg via INTRAVENOUS
  Filled 2017-04-03: qty 17

## 2017-04-03 MED ORDER — EPOETIN ALFA 20000 UNIT/ML IJ SOLN
INTRAMUSCULAR | Status: AC
  Administered 2017-04-03: 20000 [IU] via SUBCUTANEOUS
  Filled 2017-04-03: qty 1

## 2017-04-04 LAB — PTH, INTACT AND CALCIUM
Calcium, Total (PTH): 9.3 mg/dL (ref 8.7–10.3)
PTH: 171 pg/mL — ABNORMAL HIGH (ref 15–65)

## 2017-04-11 ENCOUNTER — Encounter (HOSPITAL_COMMUNITY)
Admission: RE | Admit: 2017-04-11 | Discharge: 2017-04-11 | Disposition: A | Discharge status: Home / Self Care | Payer: Medicare Other | Source: Ambulatory Visit | Attending: Nephrology | Admitting: Nephrology

## 2017-04-11 DIAGNOSIS — N183 Chronic kidney disease, stage 3 (moderate): Secondary | ICD-10-CM | POA: Insufficient documentation

## 2017-04-11 DIAGNOSIS — D638 Anemia in other chronic diseases classified elsewhere: Secondary | ICD-10-CM | POA: Diagnosis not present

## 2017-04-11 MED ORDER — SODIUM CHLORIDE 0.9 % IV SOLN
510.0000 mg | INTRAVENOUS | Status: AC
  Administered 2017-04-11: 510 mg via INTRAVENOUS
  Filled 2017-04-11: qty 17

## 2017-04-27 ENCOUNTER — Encounter (HOSPITAL_COMMUNITY)
Admission: RE | Admit: 2017-04-27 | Discharge: 2017-04-27 | Disposition: A | Discharge status: Home / Self Care | Payer: Medicare Other | Source: Ambulatory Visit | Attending: Nephrology | Admitting: Nephrology

## 2017-04-27 DIAGNOSIS — N183 Chronic kidney disease, stage 3 unspecified: Secondary | ICD-10-CM

## 2017-04-27 DIAGNOSIS — D638 Anemia in other chronic diseases classified elsewhere: Secondary | ICD-10-CM | POA: Diagnosis not present

## 2017-04-27 LAB — IRON AND TIBC
Iron: 85 ug/dL (ref 28–170)
Saturation Ratios: 42 % — ABNORMAL HIGH (ref 10.4–31.8)
TIBC: 200 ug/dL — ABNORMAL LOW (ref 250–450)
UIBC: 115 ug/dL

## 2017-04-27 LAB — FERRITIN: Ferritin: 1109 ng/mL — ABNORMAL HIGH (ref 11–307)

## 2017-04-27 LAB — POCT HEMOGLOBIN-HEMACUE: Hemoglobin: 10.3 g/dL — ABNORMAL LOW (ref 12.0–15.0)

## 2017-04-27 MED ORDER — EPOETIN ALFA 20000 UNIT/ML IJ SOLN
INTRAMUSCULAR | Status: AC
  Administered 2017-04-27: 20000 [IU]
  Filled 2017-04-27: qty 1

## 2017-04-27 MED ORDER — EPOETIN ALFA 10000 UNIT/ML IJ SOLN: 20000.0000 [IU] | INTRAMUSCULAR | Status: DC

## 2017-05-18 ENCOUNTER — Inpatient Hospital Stay (HOSPITAL_COMMUNITY)
Admission: RE | Admit: 2017-05-18 | Discharge: 2017-05-18 | Disposition: A | Discharge status: Home / Self Care | Payer: Self-pay | Source: Ambulatory Visit | Attending: Nephrology | Admitting: Nephrology

## 2017-05-19 DIAGNOSIS — E113553 Type 2 diabetes mellitus with stable proliferative diabetic retinopathy, bilateral: Secondary | ICD-10-CM | POA: Diagnosis not present

## 2017-05-19 DIAGNOSIS — Z961 Presence of intraocular lens: Secondary | ICD-10-CM | POA: Diagnosis not present

## 2017-06-02 ENCOUNTER — Encounter (HOSPITAL_COMMUNITY)
Admission: RE | Admit: 2017-06-02 | Discharge: 2017-06-02 | Disposition: A | Discharge status: Home / Self Care | Payer: Medicare Other | Source: Ambulatory Visit | Attending: Nephrology | Admitting: Nephrology

## 2017-06-02 DIAGNOSIS — N183 Chronic kidney disease, stage 3 unspecified: Secondary | ICD-10-CM

## 2017-06-02 DIAGNOSIS — D638 Anemia in other chronic diseases classified elsewhere: Secondary | ICD-10-CM | POA: Insufficient documentation

## 2017-06-02 LAB — RENAL FUNCTION PANEL
Albumin: 3.6 g/dL (ref 3.5–5.0)
Anion gap: 9 (ref 5–15)
BUN: 33 mg/dL — ABNORMAL HIGH (ref 6–20)
CO2: 28 mmol/L (ref 22–32)
Calcium: 8.9 mg/dL (ref 8.9–10.3)
Chloride: 99 mmol/L — ABNORMAL LOW (ref 101–111)
Creatinine, Ser: 2.54 mg/dL — ABNORMAL HIGH (ref 0.44–1.00)
GFR calc Af Amer: 20 mL/min — ABNORMAL LOW (ref >60)
GFR calc non Af Amer: 17 mL/min — ABNORMAL LOW (ref >60)
Glucose, Bld: 259 mg/dL — ABNORMAL HIGH (ref 65–99)
Phosphorus: 3.3 mg/dL (ref 2.5–4.6)
Potassium: 3.6 mmol/L (ref 3.5–5.1)
Sodium: 136 mmol/L (ref 135–145)

## 2017-06-02 LAB — IRON AND TIBC
Iron: 81 ug/dL (ref 28–170)
Saturation Ratios: 40 % — ABNORMAL HIGH (ref 10.4–31.8)
TIBC: 202 ug/dL — ABNORMAL LOW (ref 250–450)
UIBC: 121 ug/dL

## 2017-06-02 LAB — FERRITIN: Ferritin: 1016 ng/mL — ABNORMAL HIGH (ref 11–307)

## 2017-06-02 MED ORDER — EPOETIN ALFA 20000 UNIT/ML IJ SOLN
INTRAMUSCULAR | Status: AC
  Administered 2017-06-02: 20000 [IU]
  Filled 2017-06-02: qty 1

## 2017-06-02 MED ORDER — EPOETIN ALFA 10000 UNIT/ML IJ SOLN: 20000.0000 [IU] | INTRAMUSCULAR | Status: DC

## 2017-06-03 LAB — PTH, INTACT AND CALCIUM
Calcium, Total (PTH): 9.1 mg/dL (ref 8.7–10.3)
PTH: 168 pg/mL — ABNORMAL HIGH (ref 15–65)

## 2017-06-05 LAB — POCT HEMOGLOBIN-HEMACUE: Hemoglobin: 10.7 g/dL — ABNORMAL LOW (ref 12.0–15.0)

## 2017-06-21 DIAGNOSIS — E1122 Type 2 diabetes mellitus with diabetic chronic kidney disease: Secondary | ICD-10-CM | POA: Diagnosis not present

## 2017-06-23 ENCOUNTER — Encounter (HOSPITAL_COMMUNITY)
Admission: RE | Admit: 2017-06-23 | Discharge: 2017-06-23 | Disposition: A | Discharge status: Home / Self Care | Payer: Medicare Other | Source: Ambulatory Visit | Attending: Nephrology | Admitting: Nephrology

## 2017-06-23 VITALS — BP 156/67 | HR 87 | Temp 98.3°F | Resp 20

## 2017-06-23 DIAGNOSIS — N183 Chronic kidney disease, stage 3 unspecified: Secondary | ICD-10-CM

## 2017-06-23 DIAGNOSIS — D638 Anemia in other chronic diseases classified elsewhere: Secondary | ICD-10-CM | POA: Diagnosis not present

## 2017-06-23 MED ORDER — EPOETIN ALFA 20000 UNIT/ML IJ SOLN
INTRAMUSCULAR | Status: AC
  Administered 2017-06-23: 20000 [IU] via SUBCUTANEOUS
  Filled 2017-06-23: qty 1

## 2017-06-23 MED ORDER — EPOETIN ALFA 10000 UNIT/ML IJ SOLN: 20000.0000 [IU] | INTRAMUSCULAR | Status: DC

## 2017-06-26 LAB — POCT HEMOGLOBIN-HEMACUE: Hemoglobin: 11 g/dL — ABNORMAL LOW (ref 12.0–15.0)

## 2017-07-14 ENCOUNTER — Encounter (HOSPITAL_COMMUNITY)
Admission: RE | Admit: 2017-07-14 | Discharge: 2017-07-14 | Disposition: A | Discharge status: Home / Self Care | Payer: Medicare Other | Source: Ambulatory Visit | Attending: Nephrology | Admitting: Nephrology

## 2017-07-14 VITALS — BP 165/70 | HR 82 | Temp 98.1°F | Resp 20

## 2017-07-14 DIAGNOSIS — D638 Anemia in other chronic diseases classified elsewhere: Secondary | ICD-10-CM | POA: Insufficient documentation

## 2017-07-14 DIAGNOSIS — N183 Chronic kidney disease, stage 3 unspecified: Secondary | ICD-10-CM

## 2017-07-14 LAB — IRON AND TIBC
Iron: 101 ug/dL (ref 28–170)
Saturation Ratios: 47 % — ABNORMAL HIGH (ref 10.4–31.8)
TIBC: 217 ug/dL — ABNORMAL LOW (ref 250–450)
UIBC: 116 ug/dL

## 2017-07-14 LAB — RENAL FUNCTION PANEL
Albumin: 3.4 g/dL — ABNORMAL LOW (ref 3.5–5.0)
Anion gap: 10 (ref 5–15)
BUN: 34 mg/dL — ABNORMAL HIGH (ref 6–20)
CO2: 26 mmol/L (ref 22–32)
Calcium: 8.7 mg/dL — ABNORMAL LOW (ref 8.9–10.3)
Chloride: 101 mmol/L (ref 101–111)
Creatinine, Ser: 2.51 mg/dL — ABNORMAL HIGH (ref 0.44–1.00)
GFR calc Af Amer: 20 mL/min — ABNORMAL LOW (ref >60)
GFR calc non Af Amer: 17 mL/min — ABNORMAL LOW (ref >60)
Glucose, Bld: 208 mg/dL — ABNORMAL HIGH (ref 65–99)
Phosphorus: 4.7 mg/dL — ABNORMAL HIGH (ref 2.5–4.6)
Potassium: 3.3 mmol/L — ABNORMAL LOW (ref 3.5–5.1)
Sodium: 137 mmol/L (ref 135–145)

## 2017-07-14 LAB — FERRITIN: Ferritin: 882 ng/mL — ABNORMAL HIGH (ref 11–307)

## 2017-07-14 MED ORDER — EPOETIN ALFA 10000 UNIT/ML IJ SOLN: 20000.0000 [IU] | INTRAMUSCULAR | Status: DC

## 2017-07-14 MED ORDER — EPOETIN ALFA 20000 UNIT/ML IJ SOLN
INTRAMUSCULAR | Status: AC
  Administered 2017-07-14: 20000 [IU]
  Filled 2017-07-14: qty 1

## 2017-07-15 LAB — PTH, INTACT AND CALCIUM
Calcium, Total (PTH): 9 mg/dL (ref 8.7–10.3)
PTH: 220 pg/mL — ABNORMAL HIGH (ref 15–65)

## 2017-07-18 LAB — POCT HEMOGLOBIN-HEMACUE: Hemoglobin: 10.6 g/dL — ABNORMAL LOW (ref 12.0–15.0)

## 2017-08-17 ENCOUNTER — Encounter (HOSPITAL_COMMUNITY)
Admission: RE | Admit: 2017-08-17 | Discharge: 2017-08-17 | Disposition: A | Discharge status: Home / Self Care | Payer: Medicare Other | Source: Ambulatory Visit | Attending: Nephrology | Admitting: Nephrology

## 2017-08-17 VITALS — BP 165/71 | HR 93 | Temp 98.1°F | Resp 100

## 2017-08-17 DIAGNOSIS — N183 Chronic kidney disease, stage 3 unspecified: Secondary | ICD-10-CM

## 2017-08-17 DIAGNOSIS — D638 Anemia in other chronic diseases classified elsewhere: Secondary | ICD-10-CM | POA: Diagnosis not present

## 2017-08-17 LAB — POCT HEMOGLOBIN-HEMACUE: Hemoglobin: 10 g/dL — ABNORMAL LOW (ref 12.0–15.0)

## 2017-08-17 MED ORDER — EPOETIN ALFA 20000 UNIT/ML IJ SOLN
INTRAMUSCULAR | Status: AC
  Administered 2017-08-17: 20000 [IU] via SUBCUTANEOUS
  Filled 2017-08-17: qty 1

## 2017-08-17 MED ORDER — EPOETIN ALFA 10000 UNIT/ML IJ SOLN: 20000.0000 [IU] | INTRAMUSCULAR | Status: DC

## 2017-09-08 ENCOUNTER — Inpatient Hospital Stay (HOSPITAL_COMMUNITY)
Admission: RE | Admit: 2017-09-08 | Discharge: 2017-09-08 | Disposition: A | Discharge status: Home / Self Care | Payer: Self-pay | Source: Ambulatory Visit | Attending: Nephrology | Admitting: Nephrology

## 2017-09-20 DIAGNOSIS — E1122 Type 2 diabetes mellitus with diabetic chronic kidney disease: Secondary | ICD-10-CM | POA: Diagnosis not present

## 2017-09-20 DIAGNOSIS — N184 Chronic kidney disease, stage 4 (severe): Secondary | ICD-10-CM | POA: Diagnosis not present

## 2017-09-20 DIAGNOSIS — I130 Hypertensive heart and chronic kidney disease with heart failure and stage 1 through stage 4 chronic kidney disease, or unspecified chronic kidney disease: Secondary | ICD-10-CM | POA: Diagnosis not present

## 2017-09-20 DIAGNOSIS — Z79899 Other long term (current) drug therapy: Secondary | ICD-10-CM | POA: Diagnosis not present

## 2017-09-20 DIAGNOSIS — N08 Glomerular disorders in diseases classified elsewhere: Secondary | ICD-10-CM | POA: Diagnosis not present

## 2017-09-20 DIAGNOSIS — I129 Hypertensive chronic kidney disease with stage 1 through stage 4 chronic kidney disease, or unspecified chronic kidney disease: Secondary | ICD-10-CM | POA: Diagnosis not present

## 2017-09-20 DIAGNOSIS — E559 Vitamin D deficiency, unspecified: Secondary | ICD-10-CM | POA: Diagnosis not present

## 2017-10-19 ENCOUNTER — Encounter (HOSPITAL_COMMUNITY)
Admission: RE | Admit: 2017-10-19 | Discharge: 2017-10-19 | Disposition: A | Discharge status: Home / Self Care | Payer: Medicare Other | Source: Ambulatory Visit | Attending: Nephrology | Admitting: Nephrology

## 2017-10-19 VITALS — BP 176/88 | HR 86 | Resp 16

## 2017-10-19 DIAGNOSIS — N183 Chronic kidney disease, stage 3 unspecified: Secondary | ICD-10-CM

## 2017-10-19 DIAGNOSIS — D638 Anemia in other chronic diseases classified elsewhere: Secondary | ICD-10-CM | POA: Diagnosis not present

## 2017-10-19 LAB — RENAL FUNCTION PANEL
Albumin: 3.4 g/dL — ABNORMAL LOW (ref 3.5–5.0)
Anion gap: 10 (ref 5–15)
BUN: 42 mg/dL — ABNORMAL HIGH (ref 6–20)
CO2: 22 mmol/L (ref 22–32)
Calcium: 8.4 mg/dL — ABNORMAL LOW (ref 8.9–10.3)
Chloride: 105 mmol/L (ref 101–111)
Creatinine, Ser: 2.41 mg/dL — ABNORMAL HIGH (ref 0.44–1.00)
GFR calc Af Amer: 21 mL/min — ABNORMAL LOW (ref >60)
GFR calc non Af Amer: 18 mL/min — ABNORMAL LOW (ref >60)
Glucose, Bld: 232 mg/dL — ABNORMAL HIGH (ref 65–99)
Phosphorus: 4.6 mg/dL (ref 2.5–4.6)
Potassium: 3.3 mmol/L — ABNORMAL LOW (ref 3.5–5.1)
Sodium: 137 mmol/L (ref 135–145)

## 2017-10-19 LAB — IRON AND TIBC
Iron: 77 ug/dL (ref 28–170)
Saturation Ratios: 36 % — ABNORMAL HIGH (ref 10.4–31.8)
TIBC: 216 ug/dL — ABNORMAL LOW (ref 250–450)
UIBC: 139 ug/dL

## 2017-10-19 LAB — POCT HEMOGLOBIN-HEMACUE: Hemoglobin: 9.5 g/dL — ABNORMAL LOW (ref 12.0–15.0)

## 2017-10-19 LAB — FERRITIN: Ferritin: 857 ng/mL — ABNORMAL HIGH (ref 11–307)

## 2017-10-19 MED ORDER — EPOETIN ALFA 20000 UNIT/ML IJ SOLN
INTRAMUSCULAR | Status: AC
  Administered 2017-10-19: 20000 [IU]
  Filled 2017-10-19: qty 1

## 2017-10-19 MED ORDER — EPOETIN ALFA 10000 UNIT/ML IJ SOLN: 20000.0000 [IU] | INTRAMUSCULAR | Status: DC

## 2017-10-20 LAB — PTH, INTACT AND CALCIUM
Calcium, Total (PTH): 8.7 mg/dL (ref 8.7–10.3)
PTH: 253 pg/mL — ABNORMAL HIGH (ref 15–65)

## 2017-11-09 ENCOUNTER — Encounter (HOSPITAL_COMMUNITY): Payer: Self-pay

## 2017-11-30 ENCOUNTER — Encounter (HOSPITAL_COMMUNITY): Payer: Self-pay

## 2017-12-01 ENCOUNTER — Ambulatory Visit (HOSPITAL_COMMUNITY)
Admission: RE | Admit: 2017-12-01 | Discharge: 2017-12-01 | Disposition: A | Discharge status: Home / Self Care | Payer: Medicare Other | Source: Ambulatory Visit | Attending: Nephrology | Admitting: Nephrology

## 2017-12-01 VITALS — BP 147/71 | HR 78 | Temp 98.3°F | Resp 20

## 2017-12-01 DIAGNOSIS — D631 Anemia in chronic kidney disease: Secondary | ICD-10-CM | POA: Diagnosis not present

## 2017-12-01 DIAGNOSIS — N183 Chronic kidney disease, stage 3 unspecified: Secondary | ICD-10-CM

## 2017-12-01 LAB — RENAL FUNCTION PANEL
Albumin: 3.3 g/dL — ABNORMAL LOW (ref 3.5–5.0)
Anion gap: 13 (ref 5–15)
BUN: 39 mg/dL — ABNORMAL HIGH (ref 6–20)
CO2: 24 mmol/L (ref 22–32)
Calcium: 8.9 mg/dL (ref 8.9–10.3)
Chloride: 104 mmol/L (ref 101–111)
Creatinine, Ser: 2.3 mg/dL — ABNORMAL HIGH (ref 0.44–1.00)
GFR calc Af Amer: 22 mL/min — ABNORMAL LOW (ref >60)
GFR calc non Af Amer: 19 mL/min — ABNORMAL LOW (ref >60)
Glucose, Bld: 183 mg/dL — ABNORMAL HIGH (ref 65–99)
Phosphorus: 4.2 mg/dL (ref 2.5–4.6)
Potassium: 3.1 mmol/L — ABNORMAL LOW (ref 3.5–5.1)
Sodium: 141 mmol/L (ref 135–145)

## 2017-12-01 LAB — FERRITIN: Ferritin: 907 ng/mL — ABNORMAL HIGH (ref 11–307)

## 2017-12-01 LAB — IRON AND TIBC
Iron: 69 ug/dL (ref 28–170)
Saturation Ratios: 34 % — ABNORMAL HIGH (ref 10.4–31.8)
TIBC: 200 ug/dL — ABNORMAL LOW (ref 250–450)
UIBC: 131 ug/dL

## 2017-12-01 MED ORDER — EPOETIN ALFA 10000 UNIT/ML IJ SOLN: 20000.0000 [IU] | INTRAMUSCULAR | Status: DC

## 2017-12-01 MED ORDER — EPOETIN ALFA 20000 UNIT/ML IJ SOLN
INTRAMUSCULAR | Status: AC
  Administered 2017-12-01: 20000 [IU]
  Filled 2017-12-01: qty 1

## 2017-12-02 LAB — PTH, INTACT AND CALCIUM
Calcium, Total (PTH): 9.1 mg/dL (ref 8.7–10.3)
PTH: 182 pg/mL — ABNORMAL HIGH (ref 15–65)

## 2017-12-04 LAB — POCT HEMOGLOBIN-HEMACUE: Hemoglobin: 9.3 g/dL — ABNORMAL LOW (ref 12.0–15.0)

## 2017-12-05 DIAGNOSIS — I129 Hypertensive chronic kidney disease with stage 1 through stage 4 chronic kidney disease, or unspecified chronic kidney disease: Secondary | ICD-10-CM | POA: Diagnosis not present

## 2017-12-05 DIAGNOSIS — D631 Anemia in chronic kidney disease: Secondary | ICD-10-CM | POA: Diagnosis not present

## 2017-12-05 DIAGNOSIS — N2581 Secondary hyperparathyroidism of renal origin: Secondary | ICD-10-CM | POA: Diagnosis not present

## 2017-12-05 DIAGNOSIS — E1122 Type 2 diabetes mellitus with diabetic chronic kidney disease: Secondary | ICD-10-CM | POA: Diagnosis not present

## 2017-12-05 DIAGNOSIS — N183 Chronic kidney disease, stage 3 (moderate): Secondary | ICD-10-CM | POA: Diagnosis not present

## 2017-12-20 DIAGNOSIS — Z1389 Encounter for screening for other disorder: Secondary | ICD-10-CM | POA: Diagnosis not present

## 2017-12-20 DIAGNOSIS — N08 Glomerular disorders in diseases classified elsewhere: Secondary | ICD-10-CM | POA: Diagnosis not present

## 2017-12-20 DIAGNOSIS — I130 Hypertensive heart and chronic kidney disease with heart failure and stage 1 through stage 4 chronic kidney disease, or unspecified chronic kidney disease: Secondary | ICD-10-CM | POA: Diagnosis not present

## 2017-12-20 DIAGNOSIS — Z79899 Other long term (current) drug therapy: Secondary | ICD-10-CM | POA: Diagnosis not present

## 2017-12-20 DIAGNOSIS — E1121 Type 2 diabetes mellitus with diabetic nephropathy: Secondary | ICD-10-CM | POA: Diagnosis not present

## 2017-12-20 DIAGNOSIS — N184 Chronic kidney disease, stage 4 (severe): Secondary | ICD-10-CM | POA: Diagnosis not present

## 2017-12-20 DIAGNOSIS — Z23 Encounter for immunization: Secondary | ICD-10-CM | POA: Diagnosis not present

## 2017-12-20 DIAGNOSIS — E1122 Type 2 diabetes mellitus with diabetic chronic kidney disease: Secondary | ICD-10-CM | POA: Diagnosis not present

## 2017-12-20 LAB — LIPID PANEL
Cholesterol: 179 (ref 0–200)
HDL: 58 (ref 35–70)
LDL Cholesterol: 77
LDl/HDL Ratio: 1.3
Triglycerides: 221 — AB (ref 40–160)

## 2017-12-20 LAB — HEMOGLOBIN A1C: Hgb A1c MFr Bld: 8.9 — AB (ref 4.0–6.0)

## 2017-12-22 ENCOUNTER — Encounter (HOSPITAL_COMMUNITY): Payer: Self-pay

## 2018-01-15 ENCOUNTER — Ambulatory Visit (HOSPITAL_COMMUNITY)
Admission: RE | Admit: 2018-01-15 | Discharge: 2018-01-15 | Disposition: A | Discharge status: Home / Self Care | Payer: Medicare Other | Source: Ambulatory Visit | Attending: Nephrology | Admitting: Nephrology

## 2018-01-15 VITALS — BP 183/79 | HR 85 | Temp 98.0°F | Resp 20

## 2018-01-15 DIAGNOSIS — N183 Chronic kidney disease, stage 3 unspecified: Secondary | ICD-10-CM

## 2018-01-15 DIAGNOSIS — D631 Anemia in chronic kidney disease: Secondary | ICD-10-CM | POA: Diagnosis not present

## 2018-01-15 DIAGNOSIS — Z79899 Other long term (current) drug therapy: Secondary | ICD-10-CM | POA: Diagnosis not present

## 2018-01-15 DIAGNOSIS — Z5181 Encounter for therapeutic drug level monitoring: Secondary | ICD-10-CM | POA: Diagnosis not present

## 2018-01-15 LAB — RENAL FUNCTION PANEL
Albumin: 3.4 g/dL — ABNORMAL LOW (ref 3.5–5.0)
Anion gap: 10 (ref 5–15)
BUN: 40 mg/dL — ABNORMAL HIGH (ref 6–20)
CO2: 26 mmol/L (ref 22–32)
Calcium: 8.9 mg/dL (ref 8.9–10.3)
Chloride: 105 mmol/L (ref 101–111)
Creatinine, Ser: 2.31 mg/dL — ABNORMAL HIGH (ref 0.44–1.00)
GFR calc Af Amer: 22 mL/min — ABNORMAL LOW (ref >60)
GFR calc non Af Amer: 19 mL/min — ABNORMAL LOW (ref >60)
Glucose, Bld: 193 mg/dL — ABNORMAL HIGH (ref 65–99)
Phosphorus: 4.1 mg/dL (ref 2.5–4.6)
Potassium: 3.6 mmol/L (ref 3.5–5.1)
Sodium: 141 mmol/L (ref 135–145)

## 2018-01-15 LAB — FERRITIN: Ferritin: 1229 ng/mL — ABNORMAL HIGH (ref 11–307)

## 2018-01-15 LAB — IRON AND TIBC
Iron: 87 ug/dL (ref 28–170)
Saturation Ratios: 39 % — ABNORMAL HIGH (ref 10.4–31.8)
TIBC: 223 ug/dL — ABNORMAL LOW (ref 250–450)
UIBC: 136 ug/dL

## 2018-01-15 MED ORDER — EPOETIN ALFA 20000 UNIT/ML IJ SOLN
INTRAMUSCULAR | Status: AC
  Filled 2018-01-15: qty 1

## 2018-01-15 MED ORDER — EPOETIN ALFA 10000 UNIT/ML IJ SOLN
20000.0000 [IU] | INTRAMUSCULAR | Status: DC
  Administered 2018-01-15: 20000 [IU] via SUBCUTANEOUS

## 2018-01-16 LAB — PTH, INTACT AND CALCIUM
Calcium, Total (PTH): 8.8 mg/dL (ref 8.7–10.3)
PTH: 270 pg/mL — ABNORMAL HIGH (ref 15–65)

## 2018-01-16 LAB — POCT HEMOGLOBIN-HEMACUE: Hemoglobin: 10.2 g/dL — ABNORMAL LOW (ref 12.0–15.0)

## 2018-02-05 ENCOUNTER — Inpatient Hospital Stay (HOSPITAL_COMMUNITY)
Admission: RE | Admit: 2018-02-05 | Discharge: 2018-02-05 | Disposition: A | Discharge status: Home / Self Care | Payer: Self-pay | Source: Ambulatory Visit | Attending: Nephrology | Admitting: Nephrology

## 2018-02-26 ENCOUNTER — Encounter (HOSPITAL_COMMUNITY): Payer: Self-pay

## 2018-03-27 LAB — HEMOGLOBIN A1C: Hgb A1c MFr Bld: 8.9 — AB (ref 4.0–6.0)

## 2018-05-26 ENCOUNTER — Other Ambulatory Visit: Payer: Self-pay | Admitting: Internal Medicine

## 2018-05-26 ENCOUNTER — Other Ambulatory Visit: Payer: Self-pay | Admitting: Nurse Practitioner

## 2018-06-13 ENCOUNTER — Other Ambulatory Visit: Payer: Self-pay

## 2018-06-13 MED ORDER — INSULIN DETEMIR 100 UNIT/ML FLEXPEN: PEN_INJECTOR | SUBCUTANEOUS | 11 refills | Status: DC

## 2018-06-19 ENCOUNTER — Other Ambulatory Visit: Payer: Self-pay | Admitting: Nurse Practitioner

## 2018-06-20 ENCOUNTER — Other Ambulatory Visit: Payer: Self-pay | Admitting: Internal Medicine

## 2018-06-27 ENCOUNTER — Ambulatory Visit (INDEPENDENT_AMBULATORY_CARE_PROVIDER_SITE_OTHER): Payer: Medicare Other | Admitting: Internal Medicine

## 2018-06-27 ENCOUNTER — Encounter: Payer: Self-pay | Admitting: Internal Medicine

## 2018-06-27 VITALS — BP 146/82 | HR 76 | Temp 98.1°F | Ht 60.0 in | Wt 167.2 lb

## 2018-06-27 DIAGNOSIS — I130 Hypertensive heart and chronic kidney disease with heart failure and stage 1 through stage 4 chronic kidney disease, or unspecified chronic kidney disease: Secondary | ICD-10-CM | POA: Diagnosis not present

## 2018-06-27 DIAGNOSIS — Z794 Long term (current) use of insulin: Secondary | ICD-10-CM

## 2018-06-27 DIAGNOSIS — N184 Chronic kidney disease, stage 4 (severe): Secondary | ICD-10-CM | POA: Diagnosis not present

## 2018-06-27 DIAGNOSIS — I5032 Chronic diastolic (congestive) heart failure: Secondary | ICD-10-CM | POA: Diagnosis not present

## 2018-06-27 DIAGNOSIS — R5383 Other fatigue: Secondary | ICD-10-CM

## 2018-06-27 DIAGNOSIS — E1122 Type 2 diabetes mellitus with diabetic chronic kidney disease: Secondary | ICD-10-CM

## 2018-06-27 DIAGNOSIS — E6609 Other obesity due to excess calories: Secondary | ICD-10-CM

## 2018-06-27 DIAGNOSIS — Z6832 Body mass index (BMI) 32.0-32.9, adult: Secondary | ICD-10-CM

## 2018-06-27 DIAGNOSIS — D649 Anemia, unspecified: Secondary | ICD-10-CM

## 2018-06-27 DIAGNOSIS — Z7982 Long term (current) use of aspirin: Secondary | ICD-10-CM

## 2018-06-27 NOTE — Patient Instructions (Signed)

## 2018-06-27 NOTE — Progress Notes (Signed)
Subjective:     Patient ID: [name] [name] , female    DOB: 1936-05-26 , 82 y.o.   MRN: [id number]   Chief Complaint  Patient presents with  . Diabetes  . Hypertension    HPI  Diabetes  She presents for her follow-up diabetic visit. She has type 2 diabetes mellitus. Her disease course has been fluctuating. There are no hypoglycemic associated symptoms. Associated symptoms include fatigue. There are no hypoglycemic complications. Symptoms are stable. Diabetic complications include nephropathy. Risk factors for coronary artery disease include diabetes mellitus, dyslipidemia, hypertension and post-menopausal.  Hypertension      Past Medical History:  Diagnosis Date  . Arthritis    RA  . CHF (congestive heart failure) ([place])   . CKD (chronic kidney disease) stage 3, GFR 30-59 ml/min (HCC)   . Diabetes mellitus without complication (HCC)    insulin dependent  . Hyperlipidemia   . Hypertension   . Shortness of breath dyspnea   . Sleep apnea    cpap  . TIA (transient ischemic attack) 2015     Family History  Problem Relation Age of Onset  . Diabetes type II Mother   . Hypertension Mother   . Diabetes type II Father   . Hypertension Other   . Hyperlipidemia Other   . Stroke Other      Current Outpatient Medications:  .  acetaminophen (TYLENOL) 500 MG tablet, Take 1,000 mg by mouth every 8 (eight) hours as needed for mild pain., Disp: , Rfl:  .  amLODipine (NORVASC) 10 MG tablet, Take 10 mg by mouth daily. For blood pressure, Disp: , Rfl:  .  aspirin 325 MG tablet, Take 325 mg by mouth daily. For stroke prevention with history of TIA, Disp: , Rfl:  .  atorvastatin (LIPITOR) 20 MG tablet, Take 20 mg by mouth daily at 6 PM. For cholesterol, Disp: , Rfl: 0 .  cetirizine (ZYRTEC) 10 MG tablet, Take 10 mg by mouth daily., Disp: , Rfl:  .  cloNIDine (CATAPRES) 0.1 MG tablet, TAKE 1 TABLET BY MOUTH THREE TIMES DAILY, Disp: 270 tablet, Rfl: 0 .  docusate sodium (COLACE) 100  MG capsule, Take 1 capsule (100 mg total) by mouth daily., Disp: 60 capsule, Rfl: 0 .  furosemide (LASIX) 80 MG tablet, TAKE 2 TABLETS BY MOUTH TWICE DAILY, Disp: 360 tablet, Rfl: 0 .  glucose blood test strip, Use as instructed, Disp: 100 each, Rfl: 12 .  hydrALAZINE (APRESOLINE) 25 MG tablet, TAKE 1 TABLET BY MOUTH THREE TIMES A DAY WITH FOOD, Disp: 270 tablet, Rfl: 0 .  Insulin Detemir (LEVEMIR) 100 UNIT/ML Pen, Inject 30 units with breakfast and 15 units before bedtime., Disp: 15 mL, Rfl: 11 .  polyethylene glycol (MIRALAX / GLYCOLAX) packet, Take 17 g by mouth every hour as needed (constipation)., Disp: , Rfl:  .  blood glucose meter kit and supplies KIT, Dispense based on patient and insurance preference. Use up to four times daily as directed. (FOR ICD-9 250.00, 250.01)., Disp: 1 each, Rfl: 0   Allergies  Allergen Reactions  . Penicillins Swelling and Rash     Review of Systems  Constitutional: Positive for fatigue.  Respiratory: Negative.   Cardiovascular: Negative.   Gastrointestinal: Negative.   Genitourinary: Negative.   Musculoskeletal: Negative.   Skin: Negative.   Neurological: Negative.   Psychiatric/Behavioral: Negative.      Today's Vitals   06/27/18 1443  BP: (!) 146/82  Pulse: 76  Temp: 98.1 F (36.7 C)  TempSrc: Oral  Weight: 167 lb 3.2 oz (75.8 kg)  Height: 5' (1.524 m)  PainSc: 0-No pain   Body mass index is 32.65 kg/m.   Objective:  Physical Exam  Constitutional: She is oriented to person, place, and time. She appears well-developed and well-nourished.  HENT:  Head: Normocephalic and atraumatic.  Eyes: EOM are normal.  Cardiovascular: Normal rate, regular rhythm and normal heart sounds.  Pulmonary/Chest: Effort normal and breath sounds normal.  Neurological: She is alert and oriented to person, place, and time.  Psychiatric: She has a normal mood and affect.  Nursing note and vitals reviewed.       Assessment And Plan:     1. Type 2  diabetes mellitus with stage 4 chronic kidney disease, with [place]-term current use of insulin (HCC)  Chronic. She is encouraged to incorporate more exercise into her daily routine. She is encouraged to get up and march in place during commercials while watching TV.   - Lipid Profile - Hemoglobin A1c - BMP8+EGFR  2. Chronic renal disease, stage IV (HCC)  Chronic, also followed by nephrology.   3. Heart & renal disease, hypertensive malignant with heart failure (HCC)  Chronic, fair control today. She will continue with current meds. She is encouraged to avoid adding salt to her foods.   4. Chronic diastolic CHF (congestive heart failure) (HCC)  Chronic, yet stable.   5. Anemia, unspecified type  Chronic. Has anemia due to CKD. Will also check vitamin B12 levels due to h/o [place]-term PPI use.   - Vitamin B12  6. Fatigue, unspecified type  I will consider a sleep study if her bloodwork is normal/without any new findings.   7. Class 1 obesity due to excess calories with serious comorbidity and body mass index (BMI) of 32.0 to 32.9 in adult  She is encouraged to strive for BMI less than 30 to decrease cardiac risk.She is encouraged to avoid sugary beverages and to perform chair exercises when watching tv.    Maximino Greenland, MD

## 2018-06-28 LAB — BMP8+EGFR
BUN/Creatinine Ratio: 14 (ref 12–28)
BUN: 35 mg/dL — ABNORMAL HIGH (ref 8–27)
CO2: 23 mmol/L (ref 20–29)
Calcium: 9 mg/dL (ref 8.7–10.3)
Chloride: 102 mmol/L (ref 96–106)
Creatinine, Ser: 2.46 mg/dL — ABNORMAL HIGH (ref 0.57–1.00)
GFR calc Af Amer: 21 mL/min/{1.73_m2} — ABNORMAL LOW (ref >59)
GFR calc non Af Amer: 18 mL/min/{1.73_m2} — ABNORMAL LOW (ref >59)
Glucose: 154 mg/dL — ABNORMAL HIGH (ref 65–99)
Potassium: 4 mmol/L (ref 3.5–5.2)
Sodium: 141 mmol/L (ref 134–144)

## 2018-06-28 LAB — LIPID PANEL
Chol/HDL Ratio: 2.8 ratio (ref 0.0–4.4)
Cholesterol, Total: 169 mg/dL (ref 100–199)
HDL: 60 mg/dL (ref >39)
LDL Calculated: 73 mg/dL (ref 0–99)
Triglycerides: 179 mg/dL — ABNORMAL HIGH (ref 0–149)
VLDL Cholesterol Cal: 36 mg/dL (ref 5–40)

## 2018-06-28 LAB — HEMOGLOBIN A1C
Est. average glucose Bld gHb Est-mCnc: 197 mg/dL
Hgb A1c MFr Bld: 8.5 % — ABNORMAL HIGH (ref 4.8–5.6)

## 2018-06-28 LAB — VITAMIN B12: Vitamin B-12: 547 pg/mL (ref 232–1245)

## 2018-06-29 NOTE — Progress Notes (Signed)
Your chol looks great except for elevated triglycerides. This implies that you eat too many processed foods. Please remove white breads, rice and pasta from your diet. It is also important that you march in place twice daily for 15 minutes. You may start with seven minutes and gradually increase this as needed. Your hba1c is 8.5, this is down from 8.0.  Very good! Our goal is less than 7.8.  The increased exercise will help to get this to goal. Your kidney fxn is stable. Your vit b12 level is wnl.

## 2018-07-01 ENCOUNTER — Encounter: Payer: Self-pay | Admitting: Internal Medicine

## 2018-07-04 ENCOUNTER — Telehealth: Payer: Self-pay

## 2018-07-04 NOTE — Telephone Encounter (Signed)
-----   Message from [name], MD sent at 06/29/2018  7:47 PM EST ----- Your chol looks great except for elevated triglycerides. This implies that you eat too many processed foods. Please remove white breads, rice and pasta from your diet. It is also important that you march in place twice daily for 15 minutes. You may start with seven minutes and gradually increase this as needed. Your hba1c is 8.5, this is down from 8.0.  Very good! Our goal is less than 7.8.  The increased exercise will help to get this to goal. Your kidney fxn is stable. Your vit b12 level is wnl.

## 2018-07-04 NOTE — Telephone Encounter (Signed)
Left the pt a message to call back for her lab results. 

## 2018-07-21 ENCOUNTER — Other Ambulatory Visit: Payer: Self-pay | Admitting: Internal Medicine

## 2018-09-04 ENCOUNTER — Other Ambulatory Visit: Payer: Self-pay | Admitting: Internal Medicine

## 2018-09-25 ENCOUNTER — Other Ambulatory Visit: Payer: Self-pay | Admitting: Internal Medicine

## 2018-09-27 ENCOUNTER — Encounter: Payer: Self-pay | Admitting: Internal Medicine

## 2018-09-27 ENCOUNTER — Other Ambulatory Visit: Payer: Self-pay

## 2018-09-27 ENCOUNTER — Ambulatory Visit (INDEPENDENT_AMBULATORY_CARE_PROVIDER_SITE_OTHER): Payer: Medicare Other | Admitting: Internal Medicine

## 2018-09-27 VITALS — BP 180/86 | HR 86 | Temp 97.9°F | Ht 60.6 in | Wt 166.4 lb

## 2018-09-27 DIAGNOSIS — I130 Hypertensive heart and chronic kidney disease with heart failure and stage 1 through stage 4 chronic kidney disease, or unspecified chronic kidney disease: Secondary | ICD-10-CM

## 2018-09-27 DIAGNOSIS — E1121 Type 2 diabetes mellitus with diabetic nephropathy: Secondary | ICD-10-CM

## 2018-09-27 DIAGNOSIS — I5032 Chronic diastolic (congestive) heart failure: Secondary | ICD-10-CM | POA: Diagnosis not present

## 2018-09-27 DIAGNOSIS — N183 Chronic kidney disease, stage 3 unspecified: Secondary | ICD-10-CM

## 2018-09-27 DIAGNOSIS — K5904 Chronic idiopathic constipation: Secondary | ICD-10-CM

## 2018-09-27 DIAGNOSIS — I10 Essential (primary) hypertension: Secondary | ICD-10-CM

## 2018-09-27 DIAGNOSIS — E785 Hyperlipidemia, unspecified: Secondary | ICD-10-CM

## 2018-09-27 MED ORDER — ATORVASTATIN CALCIUM 20 MG PO TABS: 20.0000 mg | ORAL_TABLET | Freq: Every day | ORAL | 1 refills | Status: DC

## 2018-09-27 MED ORDER — HYDRALAZINE HCL 25 MG PO TABS: ORAL_TABLET | ORAL | 1 refills | Status: DC

## 2018-09-27 MED ORDER — CLONIDINE HCL 0.1 MG PO TABS: 0.1000 mg | ORAL_TABLET | Freq: Two times a day (BID) | ORAL | 1 refills | Status: DC

## 2018-09-27 MED ORDER — FUROSEMIDE 80 MG PO TABS: 160.0000 mg | ORAL_TABLET | Freq: Two times a day (BID) | ORAL | 0 refills | Status: DC

## 2018-09-27 NOTE — Patient Instructions (Signed)

## 2018-09-28 LAB — CMP14+EGFR
ALT: 11 IU/L (ref 0–32)
AST: 16 IU/L (ref 0–40)
Albumin/Globulin Ratio: 1.1 — ABNORMAL LOW (ref 1.2–2.2)
Albumin: 4.1 g/dL (ref 3.7–4.7)
Alkaline Phosphatase: 117 IU/L (ref 39–117)
BUN/Creatinine Ratio: 9 — ABNORMAL LOW (ref 12–28)
BUN: 24 mg/dL (ref 8–27)
Bilirubin Total: 0.3 mg/dL (ref 0.0–1.2)
CO2: 23 mmol/L (ref 20–29)
Calcium: 9.3 mg/dL (ref 8.7–10.3)
Chloride: 104 mmol/L (ref 96–106)
Creatinine, Ser: 2.54 mg/dL — ABNORMAL HIGH (ref 0.57–1.00)
GFR calc Af Amer: 20 mL/min/{1.73_m2} — ABNORMAL LOW (ref >59)
GFR calc non Af Amer: 17 mL/min/{1.73_m2} — ABNORMAL LOW (ref >59)
Globulin, Total: 3.7 g/dL (ref 1.5–4.5)
Glucose: 175 mg/dL — ABNORMAL HIGH (ref 65–99)
Potassium: 3.9 mmol/L (ref 3.5–5.2)
Sodium: 143 mmol/L (ref 134–144)
Total Protein: 7.8 g/dL (ref 6.0–8.5)

## 2018-09-28 LAB — HEMOGLOBIN A1C
Est. average glucose Bld gHb Est-mCnc: 171 mg/dL
Hgb A1c MFr Bld: 7.6 % — ABNORMAL HIGH (ref 4.8–5.6)

## 2018-09-30 DIAGNOSIS — I130 Hypertensive heart and chronic kidney disease with heart failure and stage 1 through stage 4 chronic kidney disease, or unspecified chronic kidney disease: Secondary | ICD-10-CM | POA: Insufficient documentation

## 2018-09-30 DIAGNOSIS — K5904 Chronic idiopathic constipation: Secondary | ICD-10-CM | POA: Insufficient documentation

## 2018-09-30 NOTE — Progress Notes (Signed)
Subjective:     Patient ID: [name] [name] , female    DOB: 05-27-1936 , 83 y.o.   MRN: [id number]   Chief Complaint  Patient presents with  . Diabetes  . Hypertension    HPI  Diabetes  She presents for her follow-up diabetic visit. She has type 2 diabetes mellitus. Her disease course has been stable. There are no hypoglycemic associated symptoms. Pertinent negatives for diabetes include no blurred vision and no chest pain. There are no hypoglycemic complications. Diabetic complications include nephropathy. Risk factors for coronary artery disease include diabetes mellitus, dyslipidemia, hypertension, obesity, post-menopausal and sedentary lifestyle.  Hypertension  This is a chronic problem. The current episode started more than 1 year ago. The problem has been gradually improving since onset. The problem is controlled. Pertinent negatives include no blurred vision, chest pain, palpitations or shortness of breath.   Reports compliance with meds.   Past Medical History:  Diagnosis Date  . Arthritis    RA  . CHF (congestive heart failure) ([place])   . CKD (chronic kidney disease) stage 3, GFR 30-59 ml/min (HCC)   . Diabetes mellitus without complication (HCC)    insulin dependent  . Hyperlipidemia   . Hypertension   . Shortness of breath dyspnea   . Sleep apnea    cpap  . TIA (transient ischemic attack) 2015     Family History  Problem Relation Age of Onset  . Diabetes type II Mother   . Hypertension Mother   . Diabetes type II Father   . Hypertension Other   . Hyperlipidemia Other   . Stroke Other      Current Outpatient Medications:  .  acetaminophen (TYLENOL) 500 MG tablet, Take 1,000 mg by mouth every 8 (eight) hours as needed for mild pain., Disp: , Rfl:  .  amLODipine (NORVASC) 10 MG tablet, TAKE 1 TABLET BY MOUTH ONCE DAILY, Disp: 90 tablet, Rfl: 1 .  aspirin 325 MG tablet, Take 325 mg by mouth daily. For stroke prevention with history of TIA, Disp: , Rfl:   .  blood glucose meter kit and supplies KIT, Dispense based on patient and insurance preference. Use up to four times daily as directed. (FOR ICD-9 250.00, 250.01)., Disp: 1 each, Rfl: 0 .  cetirizine (ZYRTEC) 10 MG tablet, Take 10 mg by mouth daily., Disp: , Rfl:  .  glucose blood test strip, Use as instructed, Disp: 100 each, Rfl: 12 .  hydrALAZINE (APRESOLINE) 25 MG tablet, TAKE 1 TABLET BY MOUTH THREE TIMES A DAY WITH FOOD, Disp: 270 tablet, Rfl: 1 .  Insulin Detemir (LEVEMIR) 100 UNIT/ML Pen, Inject 30 units with breakfast and 15 units before bedtime., Disp: 15 mL, Rfl: 11 .  atorvastatin (LIPITOR) 20 MG tablet, Take 1 tablet (20 mg total) by mouth daily at 6 PM. For cholesterol, Disp: 90 tablet, Rfl: 1 .  cloNIDine (CATAPRES) 0.1 MG tablet, Take 1 tablet (0.1 mg total) by mouth 2 (two) times daily., Disp: 180 tablet, Rfl: 1 .  furosemide (LASIX) 80 MG tablet, Take 2 tablets (160 mg total) by mouth 2 (two) times daily., Disp: 360 tablet, Rfl: 0   Allergies  Allergen Reactions  . Penicillins Swelling and Rash     Review of Systems  Constitutional: Negative.   Eyes: Negative for blurred vision.  Respiratory: Negative.  Negative for shortness of breath.   Cardiovascular: Negative.  Negative for chest pain and palpitations.  Gastrointestinal: Positive for constipation.  Neurological: Negative.   Psychiatric/Behavioral: Negative.  Today's Vitals   09/27/18 1002  BP: (!) 180/86  Pulse: 86  Temp: 97.9 F (36.6 C)  TempSrc: Oral  SpO2: 98%  Weight: 166 lb 6.4 oz (75.5 kg)  Height: 5' 0.6" (1.539 m)  PainSc: 0-No pain   Body mass index is 31.86 kg/m.   Objective:  Physical Exam Vitals signs and nursing note reviewed.  Constitutional:      Appearance: Normal appearance.  HENT:     Head: Normocephalic and atraumatic.  Cardiovascular:     Rate and Rhythm: Normal rate and regular rhythm.     Heart sounds: Normal heart sounds.  Pulmonary:     Effort: Pulmonary effort is  normal.     Breath sounds: Normal breath sounds.  Skin:    General: Skin is warm.  Neurological:     General: No focal deficit present.     Mental Status: She is alert.  Psychiatric:        Mood and Affect: Mood normal.        Behavior: Behavior normal.         Assessment And Plan:     1. Type 2 diabetes mellitus with nephropathy ([place])  I will check labs as listed below.  Importance of dietary compliance was discussed with the patient.   - CMP14+EGFR - Hemoglobin A1c  2. Chronic renal disease, stage III (HCC)  Chronic, I will check GFR, Cr today.   3. 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 She admits she is out of one of her medications, but she is not sure which one. Pharmacy was contacted, she was given refills of clonidine.   4. Chronic diastolic HF (heart failure) (HCC)  Chronic, yet stable. Importance of following a low-salt diet was discussed with the patient.   5. Chronic idiopathic constipation  She was given samples of Linzess 78m to take once daily. She is advised to take upon awakening and to wait 30 minutes prior to eating breakfast.   RMaximino Greenland MD

## 2018-10-30 ENCOUNTER — Ambulatory Visit: Payer: Medicare Other | Admitting: Internal Medicine

## 2018-12-20 ENCOUNTER — Other Ambulatory Visit: Payer: Self-pay | Admitting: Internal Medicine

## 2018-12-20 DIAGNOSIS — I10 Essential (primary) hypertension: Secondary | ICD-10-CM

## 2018-12-25 ENCOUNTER — Other Ambulatory Visit: Payer: Self-pay | Admitting: Internal Medicine

## 2018-12-25 DIAGNOSIS — I10 Essential (primary) hypertension: Secondary | ICD-10-CM

## 2019-01-13 ENCOUNTER — Other Ambulatory Visit: Payer: Self-pay | Admitting: Internal Medicine

## 2019-01-23 ENCOUNTER — Other Ambulatory Visit: Payer: Self-pay

## 2019-01-23 ENCOUNTER — Ambulatory Visit (INDEPENDENT_AMBULATORY_CARE_PROVIDER_SITE_OTHER): Payer: Medicare Other | Admitting: Internal Medicine

## 2019-01-23 ENCOUNTER — Ambulatory Visit: Payer: Medicare Other

## 2019-01-23 ENCOUNTER — Encounter: Payer: Self-pay | Admitting: Internal Medicine

## 2019-01-23 VITALS — BP 146/78 | HR 76 | Temp 98.2°F | Ht 60.0 in | Wt 166.8 lb

## 2019-01-23 DIAGNOSIS — I130 Hypertensive heart and chronic kidney disease with heart failure and stage 1 through stage 4 chronic kidney disease, or unspecified chronic kidney disease: Secondary | ICD-10-CM | POA: Diagnosis not present

## 2019-01-23 DIAGNOSIS — I5032 Chronic diastolic (congestive) heart failure: Secondary | ICD-10-CM

## 2019-01-23 DIAGNOSIS — Z0000 Encounter for general adult medical examination without abnormal findings: Secondary | ICD-10-CM

## 2019-01-23 DIAGNOSIS — E1121 Type 2 diabetes mellitus with diabetic nephropathy: Secondary | ICD-10-CM

## 2019-01-23 DIAGNOSIS — E2839 Other primary ovarian failure: Secondary | ICD-10-CM

## 2019-01-23 DIAGNOSIS — E6609 Other obesity due to excess calories: Secondary | ICD-10-CM

## 2019-01-23 DIAGNOSIS — Z6832 Body mass index (BMI) 32.0-32.9, adult: Secondary | ICD-10-CM

## 2019-01-23 LAB — POCT URINALYSIS DIPSTICK
Bilirubin, UA: NEGATIVE
Blood, UA: NEGATIVE
Glucose, UA: NEGATIVE
Ketones, UA: NEGATIVE
Leukocytes, UA: NEGATIVE
Nitrite, UA: NEGATIVE
Protein, UA: POSITIVE — AB
Spec Grav, UA: 1.015 (ref 1.010–1.025)
Urobilinogen, UA: 0.2 E.U./dL
pH, UA: 6 (ref 5.0–8.0)

## 2019-01-23 LAB — POCT UA - MICROALBUMIN
Creatinine, POC: 300 mg/dL
Microalbumin Ur, POC: 150 mg/L

## 2019-01-23 NOTE — Patient Instructions (Signed)
Health Maintenance, Female Adopting a healthy lifestyle and getting preventive care can go a [place] way to promote health and wellness. Talk with your health care provider about what schedule of regular examinations is right for you. This is a good chance for you to check in with your provider about disease prevention and staying healthy. In between checkups, there are plenty of things you can do on your own. Experts have done a lot of research about which lifestyle changes and preventive measures are most likely to keep you healthy. Ask your health care provider for more information. Weight and diet Eat a healthy diet  Be sure to include plenty of vegetables, fruits, low-fat dairy products, and lean protein.  Do not eat a lot of foods high in solid fats, added sugars, or salt.  Get regular exercise. This is one of the most important things you can do for your health. ? Most adults should exercise for at least 150 minutes each week. The exercise should increase your heart rate and make you sweat (moderate-intensity exercise). ? Most adults should also do strengthening exercises at least twice a week. This is in addition to the moderate-intensity exercise. Maintain a healthy weight  Body mass index (BMI) is a measurement that can be used to identify possible weight problems. It estimates body fat based on height and weight. Your health care provider can help determine your BMI and help you achieve or maintain a healthy weight.  For females 20 years of age and older: ? A BMI below 18.5 is considered underweight. ? A BMI of 18.5 to 24.9 is normal. ? A BMI of 25 to 29.9 is considered overweight. ? A BMI of 30 and above is considered obese. Watch levels of cholesterol and blood lipids  You should start having your blood tested for lipids and cholesterol at 81 years of age, then have this test every 5 years.  You may need to have your cholesterol levels checked more often if: ? Your lipid or  cholesterol levels are high. ? You are older than 81 years of age. ? You are at high risk for heart disease. Cancer screening Lung Cancer  Lung cancer screening is recommended for adults 55-80 years old who are at high risk for lung cancer because of a history of smoking.  A yearly low-dose CT scan of the lungs is recommended for people who: ? Currently smoke. ? Have quit within the past 15 years. ? Have at least a 30-pack-year history of smoking. A pack year is smoking an average of one pack of cigarettes a day for 1 year.  Yearly screening should continue until it has been 15 years since you quit.  Yearly screening should stop if you develop a health problem that would prevent you from having lung cancer treatment. Breast Cancer  Practice breast self-awareness. This means understanding how your breasts normally appear and feel.  It also means doing regular breast self-exams. Let your health care provider know about any changes, no matter how small.  If you are in your 20s or 30s, you should have a clinical breast exam (CBE) by a health care provider every 1-3 years as part of a regular health exam.  If you are 40 or older, have a CBE every year. Also consider having a breast X-ray (mammogram) every year.  If you have a family history of breast cancer, talk to your health care provider about genetic screening.  If you are at high risk for breast cancer, talk   to your health care provider about having an MRI and a mammogram every year.  Breast cancer gene (BRCA) assessment is recommended for women who have family members with BRCA-related cancers. BRCA-related cancers include: ? Breast. ? Ovarian. ? Tubal. ? Peritoneal cancers.  Results of the assessment will determine the need for genetic counseling and BRCA1 and BRCA2 testing. Cervical Cancer Your health care provider may recommend that you be screened regularly for cancer of the pelvic organs (ovaries, uterus, and vagina).  This screening involves a pelvic examination, including checking for microscopic changes to the surface of your cervix (Pap test). You may be encouraged to have this screening done every 3 years, beginning at age 21.  For women ages 30-65, health care providers may recommend pelvic exams and Pap testing every 3 years, or they may recommend the Pap and pelvic exam, combined with testing for human papilloma virus (HPV), every 5 years. Some types of HPV increase your risk of cervical cancer. Testing for HPV may also be done on women of any age with unclear Pap test results.  Other health care providers may not recommend any screening for nonpregnant women who are considered low risk for pelvic cancer and who do not have symptoms. Ask your health care provider if a screening pelvic exam is right for you.  If you have had past treatment for cervical cancer or a condition that could lead to cancer, you need Pap tests and screening for cancer for at least 20 years after your treatment. If Pap tests have been discontinued, your risk factors (such as having a new sexual partner) need to be reassessed to determine if screening should resume. Some women have medical problems that increase the chance of getting cervical cancer. In these cases, your health care provider may recommend more frequent screening and Pap tests. Colorectal Cancer  This type of cancer can be detected and often prevented.  Routine colorectal cancer screening usually begins at 81 years of age and continues through 81 years of age.  Your health care provider may recommend screening at an earlier age if you have risk factors for colon cancer.  Your health care provider may also recommend using home test kits to check for hidden blood in the stool.  A small camera at the end of a tube can be used to examine your colon directly (sigmoidoscopy or colonoscopy). This is done to check for the earliest forms of colorectal cancer.  Routine  screening usually begins at age 50.  Direct examination of the colon should be repeated every 5-10 years through 81 years of age. However, you may need to be screened more often if early forms of precancerous polyps or small growths are found. Skin Cancer  Check your skin from head to toe regularly.  Tell your health care provider about any new moles or changes in moles, especially if there is a change in a mole's shape or color.  Also tell your health care provider if you have a mole that is larger than the size of a pencil eraser.  Always use sunscreen. Apply sunscreen liberally and repeatedly throughout the day.  Protect yourself by wearing [place] sleeves, pants, a wide-brimmed hat, and sunglasses whenever you are outside. Heart disease, diabetes, and high blood pressure  High blood pressure causes heart disease and increases the risk of stroke. High blood pressure is more likely to develop in: ? People who have blood pressure in the high end of the normal range (130-139/85-89 mm Hg). ? People   who are overweight or obese. ? People who are African American.  If you are 84-22 years of age, have your blood pressure checked every 3-5 years. If you are 67 years of age or older, have your blood pressure checked every year. You should have your blood pressure measured twice-once when you are at a hospital or clinic, and once when you are not at a hospital or clinic. Record the average of the two measurements. To check your blood pressure when you are not at a hospital or clinic, you can use: ? An automated blood pressure machine at a pharmacy. ? A home blood pressure monitor.  If you are between 52 years and 3 years old, ask your health care provider if you should take aspirin to prevent strokes.  Have regular diabetes screenings. This involves taking a blood sample to check your fasting blood sugar level. ? If you are at a normal weight and have a low risk for diabetes, have this test once  every three years after 81 years of age. ? If you are overweight and have a high risk for diabetes, consider being tested at a younger age or more often. Preventing infection Hepatitis B  If you have a higher risk for hepatitis B, you should be screened for this virus. You are considered at high risk for hepatitis B if: ? You were born in a country where hepatitis B is common. Ask your health care provider which countries are considered high risk. ? Your parents were born in a high-risk country, and you have not been immunized against hepatitis B (hepatitis B vaccine). ? You have HIV or AIDS. ? You use needles to inject street drugs. ? You live with someone who has hepatitis B. ? You have had sex with someone who has hepatitis B. ? You get hemodialysis treatment. ? You take certain medicines for conditions, including cancer, organ transplantation, and autoimmune conditions. Hepatitis C  Blood testing is recommended for: ? Everyone born from [birth year] through 1963. ? Anyone with known risk factors for hepatitis C. Sexually transmitted infections (STIs)  You should be screened for sexually transmitted infections (STIs) including gonorrhea and chlamydia if: ? You are sexually active and are younger than 81 years of age. ? You are older than 81 years of age and your health care provider tells you that you are at risk for this type of infection. ? Your sexual activity has changed since you were last screened and you are at an increased risk for chlamydia or gonorrhea. Ask your health care provider if you are at risk.  If you do not have HIV, but are at risk, it may be recommended that you take a prescription medicine daily to prevent HIV infection. This is called pre-exposure prophylaxis (PrEP). You are considered at risk if: ? You are sexually active and do not regularly use condoms or know the HIV status of your partner(s). ? You take drugs by injection. ? You are sexually active with a partner  who has HIV. Talk with your health care provider about whether you are at high risk of being infected with HIV. If you choose to begin PrEP, you should first be tested for HIV. You should then be tested every 3 months for as [place] as you are taking PrEP. Pregnancy  If you are premenopausal and you may become pregnant, ask your health care provider about preconception counseling.  If you may become pregnant, take 400 to 800 micrograms (mcg) of folic acid every  day.  If you want to prevent pregnancy, talk to your health care provider about birth control (contraception). Osteoporosis and menopause  Osteoporosis is a disease in which the bones lose minerals and strength with aging. This can result in serious bone fractures. Your risk for osteoporosis can be identified using a bone density scan.  If you are 36 years of age or older, or if you are at risk for osteoporosis and fractures, ask your health care provider if you should be screened.  Ask your health care provider whether you should take a calcium or vitamin D supplement to lower your risk for osteoporosis.  Menopause may have certain physical symptoms and risks.  Hormone replacement therapy may reduce some of these symptoms and risks. Talk to your health care provider about whether hormone replacement therapy is right for you. Follow these instructions at home:  Schedule regular health, dental, and eye exams.  Stay current with your immunizations.  Do not use any tobacco products including cigarettes, chewing tobacco, or electronic cigarettes.  If you are pregnant, do not drink alcohol.  If you are breastfeeding, limit how much and how often you drink alcohol.  Limit alcohol intake to no more than 1 drink per day for nonpregnant women. One drink equals 12 ounces of beer, 5 ounces of wine, or 1 ounces of hard liquor.  Do not use street drugs.  Do not share needles.  Ask your health care provider for help if you need support  or information about quitting drugs.  Tell your health care provider if you often feel depressed.  Tell your health care provider if you have ever been abused or do not feel safe at home. This information is not intended to replace advice given to you by your health care provider. Make sure you discuss any questions you have with your health care provider. Document Released: 02/07/2011 Document Revised: 12/31/2015 Document Reviewed: 04/28/2015 Elsevier Interactive Patient Education  2019 [employer].

## 2019-01-24 ENCOUNTER — Ambulatory Visit (INDEPENDENT_AMBULATORY_CARE_PROVIDER_SITE_OTHER): Payer: Medicare Other

## 2019-01-24 VITALS — Temp 98.7°F | Ht 60.0 in | Wt 160.0 lb

## 2019-01-24 DIAGNOSIS — Z0000 Encounter for general adult medical examination without abnormal findings: Secondary | ICD-10-CM

## 2019-01-24 LAB — CBC
Hematocrit: 30.3 % — ABNORMAL LOW (ref 34.0–46.6)
Hemoglobin: 9.2 g/dL — ABNORMAL LOW (ref 11.1–15.9)
MCH: 21.4 pg — ABNORMAL LOW (ref 26.6–33.0)
MCHC: 30.4 g/dL — ABNORMAL LOW (ref 31.5–35.7)
MCV: 71 fL — ABNORMAL LOW (ref 79–97)
Platelets: 257 10*3/uL (ref 150–450)
RBC: 4.3 x10E6/uL (ref 3.77–5.28)
RDW: 15.9 % — ABNORMAL HIGH (ref 11.7–15.4)
WBC: 5.5 10*3/uL (ref 3.4–10.8)

## 2019-01-24 LAB — CMP14+EGFR
ALT: 8 IU/L (ref 0–32)
AST: 14 IU/L (ref 0–40)
Albumin/Globulin Ratio: 1.1 — ABNORMAL LOW (ref 1.2–2.2)
Albumin: 4.1 g/dL (ref 3.6–4.6)
Alkaline Phosphatase: 119 IU/L — ABNORMAL HIGH (ref 39–117)
BUN/Creatinine Ratio: 14 (ref 12–28)
BUN: 40 mg/dL — ABNORMAL HIGH (ref 8–27)
Bilirubin Total: 0.3 mg/dL (ref 0.0–1.2)
CO2: 24 mmol/L (ref 20–29)
Calcium: 8.8 mg/dL (ref 8.7–10.3)
Chloride: 102 mmol/L (ref 96–106)
Creatinine, Ser: 2.79 mg/dL — ABNORMAL HIGH (ref 0.57–1.00)
GFR calc Af Amer: 18 mL/min/{1.73_m2} — ABNORMAL LOW (ref >59)
GFR calc non Af Amer: 15 mL/min/{1.73_m2} — ABNORMAL LOW (ref >59)
Globulin, Total: 3.6 g/dL (ref 1.5–4.5)
Glucose: 120 mg/dL — ABNORMAL HIGH (ref 65–99)
Potassium: 3.9 mmol/L (ref 3.5–5.2)
Sodium: 142 mmol/L (ref 134–144)
Total Protein: 7.7 g/dL (ref 6.0–8.5)

## 2019-01-24 LAB — LIPID PANEL
Chol/HDL Ratio: 2.7 ratio (ref 0.0–4.4)
Cholesterol, Total: 166 mg/dL (ref 100–199)
HDL: 61 mg/dL (ref >39)
LDL Calculated: 78 mg/dL (ref 0–99)
Triglycerides: 135 mg/dL (ref 0–149)
VLDL Cholesterol Cal: 27 mg/dL (ref 5–40)

## 2019-01-24 LAB — HEMOGLOBIN A1C
Est. average glucose Bld gHb Est-mCnc: 160 mg/dL
Hgb A1c MFr Bld: 7.2 % — ABNORMAL HIGH (ref 4.8–5.6)

## 2019-01-24 NOTE — Progress Notes (Signed)
Subjective:   [name] [name] is a 83 y.o. female who presents for Medicare Annual (Subsequent) preventive examination.  This visit type was conducted due to national recommendations for restrictions regarding the COVID-19 Pandemic (e.g. social distancing). This format is felt to be most appropriate for this patient at this time. All issues noted in this document were discussed and addressed. No physical exam was performed (except for noted visual exam findings with Video Visits). This patient, [name] [name], has given permission to perform this visit via telephone. Vital signs may be absent or patient reported.  Patient location:  At home  Nurse location:  [place] office    Review of Systems:  n/a Cardiac Risk Factors include: advanced age (>34mn, >>57women);diabetes mellitus;dyslipidemia;hypertension;sedentary lifestyle;obesity (BMI >30kg/m2)     Objective:     Vitals: Temp 98.7 F (37.1 C) Comment: per patient  Ht 5' (1.524 m) Comment: per patient  Wt 160 lb (72.6 kg) Comment: per patient  BMI 31.25 kg/m   Body mass index is 31.25 kg/m.  Advanced Directives 01/24/2019 05/25/2015 11/22/2014 01/11/2014  Does Patient Have a Medical Advance Directive? No No No;Yes Patient does not have advance directive;Patient would not like information  Type of Advance Directive - - HRhame-  Does patient want to make changes to medical advance directive? - - No - Patient declined -  Copy of [place] Chart? - - No - copy requested -  Pre-existing out of facility DNR order (yellow form or pink MOST form) - - - No    Tobacco Social History   Tobacco Use  Smoking Status Never Smoker  Smokeless Tobacco Never Used     Counseling given: Not Answered   Clinical Intake:  Pre-visit preparation completed: Yes  Pain : No/denies pain     Nutritional Status: BMI > 30  Obese Nutritional Risks: None Diabetes: Yes CBG done?: No Did pt.  bring in CBG monitor from home?: No  How often do you need to have someone help you when you read instructions, pamphlets, or other written materials from your doctor or pharmacy?: 1 - Never What is the last grade level you completed in school?: 12th grade  Interpreter Needed?: No  Information entered by :: NAllen LPN  Past Medical History:  Diagnosis Date  . Arthritis    RA  . [place]   . CKD (chronic kidney disease) stage 3, GFR 30-59 ml/min (HCC)   . Diabetes mellitus without complication (HCC)    insulin dependent  . Hyperlipidemia   . Hypertension   . Shortness of breath dyspnea   . Sleep apnea    cpap  . TIA (transient ischemic attack) 2015   Past Surgical History:  Procedure Laterality Date  . ABDOMINAL HYSTERECTOMY  1974   Family History  Problem Relation Age of Onset  . Diabetes type II Mother   . Hypertension Mother   . Diabetes type II Father   . Hypertension Other   . Hyperlipidemia Other   . Stroke Other    Social History   Socioeconomic History  . Marital status: Married    Spouse name: Not on file  . Number of children: 6  . Years of education: 12th  . Highest education level: Not on file  Occupational History  . Occupation: retired    EF[employer] farm manager RETIRED  Social Needs  . Financial resource strain: Not hard at all  . Food insecurity  Worry: Never true    Inability: Never true  . Transportation needs    Medical: No    Non-medical: No  Tobacco Use  . Smoking status: Never Smoker  . Smokeless tobacco: Never Used  Substance and Sexual Activity  . Alcohol use: No    Alcohol/week: 0.0 standard drinks  . Drug use: No  . Sexual activity: Not Currently  Lifestyle  . Physical activity    Days per week: 0 days    Minutes per session: 0 min  . Stress: Not at all  Relationships  . Social connections    Talks on phone: Not on file    Gets together: Not on file    Attends religious service: Not on file    Active  member of club or organization: Not on file    Attends meetings of clubs or organizations: Not on file    Relationship status: Not on file  Other Topics Concern  . Not on file  Social History Narrative   Patient lives with her husband    Patient is right handed   Patient drinks tea    Outpatient Encounter Medications as of 01/24/2019  Medication Sig  . acetaminophen (TYLENOL) 500 MG tablet Take 1,000 mg by mouth every 8 (eight) hours as needed for mild pain.  . amLODipine (NORVASC) 10 MG tablet TAKE 1 TABLET BY MOUTH EVERY DAY  . aspirin 325 MG tablet Take 325 mg by mouth daily. For stroke prevention with history of TIA  . atorvastatin (LIPITOR) 20 MG tablet Take 1 tablet (20 mg total) by mouth daily at 6 PM. For cholesterol  . blood glucose meter kit and supplies KIT Dispense based on patient and insurance preference. Use up to four times daily as directed. (FOR ICD-9 250.00, 250.01).  . cetirizine (ZYRTEC) 10 MG tablet Take 10 mg by mouth daily.  . cloNIDine (CATAPRES) 0.1 MG tablet TAKE 1 TABLET BY MOUTH THREE TIMES DAILY  . furosemide (LASIX) 80 MG tablet TAKE 2 TABLETS(160 MG) BY MOUTH TWICE DAILY  . glucose blood test strip Use as instructed  . hydrALAZINE (APRESOLINE) 25 MG tablet TAKE 1 TABLET BY MOUTH THREE TIMES A DAY WITH FOOD  . Insulin Detemir (LEVEMIR) 100 UNIT/ML Pen Inject 30 units with breakfast and 15 units before bedtime.   No facility-administered encounter medications on file as of 01/24/2019.     Activities of Daily Living In your present state of health, do you have any difficulty performing the following activities: 01/24/2019  Hearing? N  Vision? N  Difficulty concentrating or making decisions? Y  Comment forgetfulness  Walking or climbing stairs? N  Dressing or bathing? N  Doing errands, shopping? Y  Comment someone brings  Preparing Food and eating ? N  Using the Toilet? N  In the past six months, have you accidently leaked urine? N  Do you have  problems with loss of bowel control? N  Managing your Medications? Y  Comment someone sets up meds for week  Managing your Finances? N  Housekeeping or managing your Housekeeping? N  Some recent data might be hidden    Patient Care Team: Sanders, Robyn, MD as PCP - General (Internal Medicine)    Assessment:   This is a routine wellness examination for [name] [name].  Exercise Activities and Dietary recommendations Current Exercise Habits: The patient does not participate in regular exercise at present  Goals    . Patient Stated     No goals         Fall Risk Fall Risk  01/24/2019 01/23/2019 09/27/2018 06/27/2018  Falls in the past year? 0 0 0 0  Risk for fall due to : Medication side effect - - -  Follow up Falls prevention discussed - - -   Is the patient's home free of loose throw rugs in walkways, pet beds, electrical cords, etc?   yes      Grab bars in the bathroom? yes      Handrails on the stairs?   n/a      Adequate lighting?   yes  Timed Get Up and Go performed: n/a  Depression Screen PHQ 2/9 Scores 01/24/2019 01/23/2019 09/27/2018 06/27/2018  PHQ - 2 Score 0 0 0 0     Cognitive Function     6CIT Screen 01/24/2019  What Year? 0 points  What month? 0 points  What time? 0 points  Count back from 20 0 points  Months in reverse 0 points  Repeat phrase 0 points  Total Score 0    Immunization History  Administered Date(s) Administered  . DTaP 07/16/2012  . Pneumococcal Conjugate-13 12/26/2017  . Pneumococcal-Unspecified 07/16/2012    Qualifies for Shingles Vaccine? yes  Screening Tests Health Maintenance  Topic Date Due  . OPHTHALMOLOGY EXAM  11/30/1945  . DEXA SCAN  11/30/2002  . INFLUENZA VACCINE  03/09/2019  . HEMOGLOBIN A1C  07/25/2019  . FOOT EXAM  01/23/2020  . TETANUS/TDAP  07/16/2022  . PNA vac Low Risk Adult  Completed    Cancer Screenings: Lung: Low Dose CT Chest recommended if Age 55-80 years, 30 pack-year currently smoking OR have quit w/in  15years. Patient does not qualify. Breast:  Up to date on Mammogram? Yes   Up to date of Bone Density/Dexa? Yes Colorectal: not required  Additional Screenings: : Hepatitis C Screening: n/a     Plan:    No goals. Declines flu vaccines   I have personally reviewed and noted the following in the patient's chart:   . Medical and social history . Use of alcohol, tobacco or illicit drugs  . Current medications and supplements . Functional ability and status . Nutritional status . Physical activity . Advanced directives . List of other physicians . Hospitalizations, surgeries, and ER visits in previous 12 months . Vitals . Screenings to include cognitive, depression, and falls . Referrals and appointments  In addition, I have reviewed and discussed with patient certain preventive protocols, quality metrics, and best practice recommendations. A written personalized care plan for preventive services as well as general preventive health recommendations were provided to patient.      E , LPN  01/24/2019     

## 2019-01-24 NOTE — Patient Instructions (Signed)
[name] [name] , Thank you for taking time to come for your Medicare Wellness Visit. I appreciate your ongoing commitment to your health goals. Please review the following plan we discussed and let me know if I can assist you in the future.   Screening recommendations/referrals: Colonoscopy: not required Mammogram: not required Bone Density: 08/2014 Recommended yearly ophthalmology/optometry visit for glaucoma screening and checkup Recommended yearly dental visit for hygiene and checkup  Vaccinations: Influenza vaccine: declined Pneumococcal vaccine: 12/2017 Tdap vaccine: 07/2012 Shingles vaccine: discussed    Advanced directives: Advance directive discussed with you today.   Conditions/risks identified: obesity  Next appointment: 04/29/2019 at 2:00   Preventive Care 81 Years and Older, Female Preventive care refers to lifestyle choices and visits with your health care provider that can promote health and wellness. What does preventive care include?  A yearly physical exam. This is also called an annual well check.  Dental exams once or twice a year.  Routine eye exams. Ask your health care provider how often you should have your eyes checked.  Personal lifestyle choices, including:  Daily care of your teeth and gums.  Regular physical activity.  Eating a healthy diet.  Avoiding tobacco and drug use.  Limiting alcohol use.  Practicing safe sex.  Taking low-dose aspirin every day.  Taking vitamin and mineral supplements as recommended by your health care provider. What happens during an annual well check? The services and screenings done by your health care provider during your annual well check will depend on your age, overall health, lifestyle risk factors, and family history of disease. Counseling  Your health care provider may ask you questions about your:  Alcohol use.  Tobacco use.  Drug use.  Emotional well-being.  Home and relationship well-being.   Sexual activity.  Eating habits.  History of falls.  Memory and ability to understand (cognition).  Work and work Statistician.  Reproductive health. Screening  You may have the following tests or measurements:  Height, weight, and BMI.  Blood pressure.  Lipid and cholesterol levels. These may be checked every 5 years, or more frequently if you are over 54 years old.  Skin check.  Lung cancer screening. You may have this screening every year starting at age 46 if you have a 30-pack-year history of smoking and currently smoke or have quit within the past 15 years.  Fecal occult blood test (FOBT) of the stool. You may have this test every year starting at age 15.  Flexible sigmoidoscopy or colonoscopy. You may have a sigmoidoscopy every 5 years or a colonoscopy every 10 years starting at age 80.  Hepatitis C blood test.  Hepatitis B blood test.  Sexually transmitted disease (STD) testing.  Diabetes screening. This is done by checking your blood sugar (glucose) after you have not eaten for a while (fasting). You may have this done every 1-3 years.  Bone density scan. This is done to screen for osteoporosis. You may have this done starting at age 24.  Mammogram. This may be done every 1-2 years. Talk to your health care provider about how often you should have regular mammograms. Talk with your health care provider about your test results, treatment options, and if necessary, the need for more tests. Vaccines  Your health care provider may recommend certain vaccines, such as:  Influenza vaccine. This is recommended every year.  Tetanus, diphtheria, and acellular pertussis (Tdap, Td) vaccine. You may need a Td booster every 10 years.  Zoster vaccine. You may need this  after age 95.  Pneumococcal 13-valent conjugate (PCV13) vaccine. One dose is recommended after age 34.  Pneumococcal polysaccharide (PPSV23) vaccine. One dose is recommended after age 33. Talk to your  health care provider about which screenings and vaccines you need and how often you need them. This information is not intended to replace advice given to you by your health care provider. Make sure you discuss any questions you have with your health care provider. Document Released: 08/21/2015 Document Revised: 04/13/2016 Document Reviewed: 05/26/2015 Elsevier Interactive Patient Education  2017 [place] Prevention in the Home Falls can cause injuries. They can happen to people of all ages. There are many things you can do to make your home safe and to help prevent falls. What can I do on the outside of my home?  Regularly fix the edges of walkways and driveways and fix any cracks.  Remove anything that might make you trip as you walk through a door, such as a raised step or threshold.  Trim any bushes or trees on the path to your home.  Use bright outdoor lighting.  Clear any walking paths of anything that might make someone trip, such as rocks or tools.  Regularly check to see if handrails are loose or broken. Make sure that both sides of any steps have handrails.  Any raised decks and porches should have guardrails on the edges.  Have any leaves, snow, or ice cleared regularly.  Use sand or salt on walking paths during winter.  Clean up any spills in your garage right away. This includes oil or grease spills. What can I do in the bathroom?  Use night lights.  Install grab bars by the toilet and in the tub and shower. Do not use towel bars as grab bars.  Use non-skid mats or decals in the tub or shower.  If you need to sit down in the shower, use a plastic, non-slip stool.  Keep the floor dry. Clean up any water that spills on the floor as soon as it happens.  Remove soap buildup in the tub or shower regularly.  Attach bath mats securely with double-sided non-slip rug tape.  Do not have throw rugs and other things on the floor that can make you trip. What  can I do in the bedroom?  Use night lights.  Make sure that you have a light by your bed that is easy to reach.  Do not use any sheets or blankets that are too big for your bed. They should not hang down onto the floor.  Have a firm chair that has side arms. You can use this for support while you get dressed.  Do not have throw rugs and other things on the floor that can make you trip. What can I do in the kitchen?  Clean up any spills right away.  Avoid walking on wet floors.  Keep items that you use a lot in easy-to-reach places.  If you need to reach something above you, use a strong step stool that has a grab bar.  Keep electrical cords out of the way.  Do not use floor polish or wax that makes floors slippery. If you must use wax, use non-skid floor wax.  Do not have throw rugs and other things on the floor that can make you trip. What can I do with my stairs?  Do not leave any items on the stairs.  Make sure that there are handrails on both sides of the  stairs and use them. Fix handrails that are broken or loose. Make sure that handrails are as [place] as the stairways.  Check any carpeting to make sure that it is firmly attached to the stairs. Fix any carpet that is loose or worn.  Avoid having throw rugs at the top or bottom of the stairs. If you do have throw rugs, attach them to the floor with carpet tape.  Make sure that you have a light switch at the top of the stairs and the bottom of the stairs. If you do not have them, ask someone to add them for you. What else can I do to help prevent falls?  Wear shoes that:  Do not have high heels.  Have rubber bottoms.  Are comfortable and fit you well.  Are closed at the toe. Do not wear sandals.  If you use a stepladder:  Make sure that it is fully opened. Do not climb a closed stepladder.  Make sure that both sides of the stepladder are locked into place.  Ask someone to hold it for you, if possible.   Clearly mark and make sure that you can see:  Any grab bars or handrails.  First and last steps.  Where the edge of each step is.  Use tools that help you move around (mobility aids) if they are needed. These include:  Canes.  Walkers.  Scooters.  Crutches.  Turn on the lights when you go into a dark area. Replace any light bulbs as soon as they burn out.  Set up your furniture so you have a clear path. Avoid moving your furniture around.  If any of your floors are uneven, fix them.  If there are any pets around you, be aware of where they are.  Review your medicines with your doctor. Some medicines can make you feel dizzy. This can increase your chance of falling. Ask your doctor what other things that you can do to help prevent falls. This information is not intended to replace advice given to you by your health care provider. Make sure you discuss any questions you have with your health care provider. Document Released: 05/21/2009 Document Revised: 12/31/2015 Document Reviewed: 08/29/2014 Elsevier Interactive Patient Education  2017 [employer].

## 2019-01-25 ENCOUNTER — Telehealth: Payer: Self-pay

## 2019-01-25 ENCOUNTER — Other Ambulatory Visit: Payer: Self-pay

## 2019-01-25 DIAGNOSIS — E1122 Type 2 diabetes mellitus with diabetic chronic kidney disease: Secondary | ICD-10-CM

## 2019-01-25 NOTE — Telephone Encounter (Signed)
Left the patient a message to call back for lab results. 

## 2019-01-26 NOTE — Progress Notes (Signed)
Subjective:     Patient ID: [name] [name] , female    DOB: May 01, 1936 , 83 y.o.   MRN: [id number]   Chief Complaint  Patient presents with  . Annual Exam  . Diabetes  . Hypertension    HPI  She is here today for her yearly exam. She has no specific concerns or complaints at this time. She reports she has been feeling quite well recently.   Diabetes She presents for her follow-up diabetic visit. She has type 2 diabetes mellitus. Her disease course has been stable. There are no hypoglycemic associated symptoms. Pertinent negatives for diabetes include no blurred vision and no chest pain. There are no hypoglycemic complications. Diabetic complications include nephropathy. Risk factors for coronary artery disease include diabetes mellitus, dyslipidemia, hypertension, obesity, post-menopausal and sedentary lifestyle. She is compliant with treatment some of the time. Her weight is stable. She is following a diabetic diet. She participates in exercise intermittently. Her home blood glucose trend is fluctuating minimally. Her breakfast blood glucose is taken between 8-9 am. Her breakfast blood glucose range is generally 130-140 mg/dl. An ACE inhibitor/angiotensin II receptor blocker is being taken. Eye exam is current.  Hypertension This is a chronic problem. The current episode started more than 1 year ago. The problem has been gradually improving since onset. The problem is controlled. Pertinent negatives include no blurred vision, chest pain, palpitations or shortness of breath. Risk factors for coronary artery disease include diabetes mellitus, dyslipidemia, obesity, post-menopausal state and sedentary lifestyle. Compliance problems include exercise.      Past Medical History:  Diagnosis Date  . Arthritis    RA  . CHF (congestive heart failure) ([place])   . CKD (chronic kidney disease) stage 3, GFR 30-59 ml/min (HCC)   . Diabetes mellitus without complication (HCC)    insulin dependent   . Hyperlipidemia   . Hypertension   . Shortness of breath dyspnea   . Sleep apnea    cpap  . TIA (transient ischemic attack) 2015     Family History  Problem Relation Age of Onset  . Diabetes type II Mother   . Hypertension Mother   . Diabetes type II Father   . Hypertension Other   . Hyperlipidemia Other   . Stroke Other      Current Outpatient Medications:  .  acetaminophen (TYLENOL) 500 MG tablet, Take 1,000 mg by mouth every 8 (eight) hours as needed for mild pain., Disp: , Rfl:  .  amLODipine (NORVASC) 10 MG tablet, TAKE 1 TABLET BY MOUTH EVERY DAY, Disp: 90 tablet, Rfl: 1 .  aspirin 325 MG tablet, Take 325 mg by mouth daily. For stroke prevention with history of TIA, Disp: , Rfl:  .  atorvastatin (LIPITOR) 20 MG tablet, Take 1 tablet (20 mg total) by mouth daily at 6 PM. For cholesterol, Disp: 90 tablet, Rfl: 1 .  blood glucose meter kit and supplies KIT, Dispense based on patient and insurance preference. Use up to four times daily as directed. (FOR ICD-9 250.00, 250.01)., Disp: 1 each, Rfl: 0 .  cetirizine (ZYRTEC) 10 MG tablet, Take 10 mg by mouth daily., Disp: , Rfl:  .  cloNIDine (CATAPRES) 0.1 MG tablet, TAKE 1 TABLET BY MOUTH THREE TIMES DAILY, Disp: 270 tablet, Rfl: 1 .  furosemide (LASIX) 80 MG tablet, TAKE 2 TABLETS(160 MG) BY MOUTH TWICE DAILY, Disp: 360 tablet, Rfl: 0 .  glucose blood test strip, Use as instructed, Disp: 100 each, Rfl: 12 .  hydrALAZINE (APRESOLINE)  25 MG tablet, TAKE 1 TABLET BY MOUTH THREE TIMES A DAY WITH FOOD, Disp: 270 tablet, Rfl: 1 .  Insulin Detemir (LEVEMIR) 100 UNIT/ML Pen, Inject 30 units with breakfast and 15 units before bedtime., Disp: 15 mL, Rfl: 11   Allergies  Allergen Reactions  . Penicillins Swelling and Rash     The patient states she uses none for birth control. Last LMP was No LMP recorded. Patient has had a hysterectomy.. Negative for Dysmenorrhea  Negative for: breast discharge, breast lump(s), breast pain and breast  self exam. Associated symptoms include abnormal vaginal bleeding. Pertinent negatives include abnormal bleeding (hematology), anxiety, decreased libido, depression, difficulty falling sleep, dyspareunia, history of infertility, nocturia, sexual dysfunction, sleep disturbances, urinary incontinence, urinary urgency, vaginal discharge and vaginal itching. Diet regular.The patient states her exercise level is  intermittent.  . The patient's tobacco use is:  Social History   Tobacco Use  Smoking Status Never Smoker  Smokeless Tobacco Never Used  . She has been exposed to passive smoke. The patient's alcohol use is:  Social History   Substance and Sexual Activity  Alcohol Use No  . Alcohol/week: 0.0 standard drinks    Review of Systems  Constitutional: Negative.   HENT: Negative.   Eyes: Negative.  Negative for blurred vision.  Respiratory: Negative.  Negative for shortness of breath.   Cardiovascular: Negative.  Negative for chest pain and palpitations.  Gastrointestinal: Negative.   Endocrine: Negative.   Genitourinary: Negative.   Musculoskeletal: Negative.   Skin: Negative.   Allergic/Immunologic: Negative.   Neurological: Negative.   Psychiatric/Behavioral: Negative.      Today's Vitals   01/23/19 1410  BP: (!) 146/78  Pulse: 76  Temp: 98.2 F (36.8 C)  TempSrc: Oral  Weight: 166 lb 12.8 oz (75.7 kg)  Height: 5' (1.524 m)  PainSc: 0-No pain   Body mass index is 32.58 kg/m.   Objective:  Physical Exam Vitals signs and nursing note reviewed.  Constitutional:      Appearance: Normal appearance. She is obese.  HENT:     Head: Normocephalic and atraumatic.     Right Ear: Tympanic membrane, ear canal and external ear normal.     Left Ear: Tympanic membrane, ear canal and external ear normal.     Nose: Nose normal.     Mouth/Throat:     Mouth: Mucous membranes are moist.     Pharynx: Oropharynx is clear.  Eyes:     Extraocular Movements: Extraocular movements  intact.     Conjunctiva/sclera: Conjunctivae normal.     Pupils: Pupils are equal, round, and reactive to light.  Neck:     Musculoskeletal: Normal range of motion and neck supple.  Cardiovascular:     Rate and Rhythm: Normal rate and regular rhythm.     Pulses: Normal pulses.          Dorsalis pedis pulses are 2+ on the right side and 2+ on the left side.     Heart sounds: Normal heart sounds.  Pulmonary:     Effort: Pulmonary effort is normal.     Breath sounds: Normal breath sounds.  Chest:     Breasts: Tanner Score is 5.        Right: No swelling, bleeding, inverted nipple, mass, nipple discharge or skin change.        Left: Normal. No swelling, bleeding, inverted nipple, mass, nipple discharge or skin change.  Abdominal:     General: Bowel sounds are normal.  Palpations: Abdomen is soft.     Comments: Rounded.   Genitourinary:    Comments: deferred Musculoskeletal: Normal range of motion.  Feet:     Right foot:     Protective Sensation: 5 sites tested. 5 sites sensed.     Skin integrity: Skin integrity normal.     Toenail Condition: Right toenails are normal.     Left foot:     Protective Sensation: 5 sites tested. 5 sites sensed.     Skin integrity: Skin integrity normal.     Toenail Condition: Left toenails are normal.  Skin:    General: Skin is warm and dry.  Neurological:     General: No focal deficit present.     Mental Status: She is alert and oriented to person, place, and time.  Psychiatric:        Mood and Affect: Mood normal.        Behavior: Behavior normal.         Assessment And Plan:     1. Routine general medical examination at health care facility  A full exam was performed. Importance of monthly self breast exams was discussed with the patient. PATIENT HAS BEEN ADVISED TO GET 30-45 MINUTES REGULAR EXERCISE NO LESS THAN FOUR TO FIVE DAYS PER WEEK - BOTH WEIGHTBEARING EXERCISES AND AEROBIC ARE RECOMMENDED.  SHE IS ADVISED TO FOLLOW A HEALTHY  DIET WITH AT LEAST SIX FRUITS/VEGGIES PER DAY, DECREASE INTAKE OF RED MEAT, AND TO INCREASE FISH INTAKE TO TWO DAYS PER WEEK.  MEATS/FISH SHOULD NOT BE FRIED, BAKED OR BROILED IS PREFERABLE.  I SUGGEST WEARING SPF 50 SUNSCREEN ON EXPOSED PARTS AND ESPECIALLY WHEN IN THE DIRECT SUNLIGHT FOR AN EXTENDED PERIOD OF TIME.  PLEASE AVOID FAST FOOD RESTAURANTS AND INCREASE YOUR WATER INTAKE.   2. Type 2 diabetes mellitus with nephropathy ([place])  Diabetic foot exam was performed.  I DISCUSSED WITH THE PATIENT AT LENGTH REGARDING THE GOALS OF GLYCEMIC CONTROL AND POSSIBLE Ferrone-TERM COMPLICATIONS.  I  ALSO STRESSED THE IMPORTANCE OF COMPLIANCE WITH HOME GLUCOSE MONITORING, DIETARY RESTRICTIONS INCLUDING AVOIDANCE OF SUGARY DRINKS/PROCESSED FOODS,  ALONG WITH REGULAR EXERCISE.  I  ALSO STRESSED THE IMPORTANCE OF ANNUAL EYE EXAMS, SELF FOOT CARE AND COMPLIANCE WITH OFFICE VISITS.  - CMP14+EGFR - CBC - Lipid panel - Hemoglobin A1c - POCT Urinalysis Dipstick (81002) - POCT UA - Microalbumin  3. 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 She will continue with current meds. She is encouraged to avoid adding salt to her foods. EKG performed, no acute changes noted.   - EKG 12-Lead  4. Chronic diastolic HF (heart failure) (HCC)  Chronic. Importance of dietary compliance was discussed with the patient.   5. Estrogen deficiency  I will refer her for bone density. Importance of calcium/vit d supplementation; along with weight bearing exercises was discussed with the patient.   - DG Bone Density; Future  6. Class 1 obesity due to excess calories with serious comorbidity and body mass index (BMI) of 32.0 to 32.9 in adult  Importance of achieving optimal weight to decrease risk of cardiovascular disease and cancers was discussed with the patient in full detail. She is encouraged to start slowly - start with 10 minutes twice daily at least three to four days per week and to  gradually build to 30 minutes five days weekly. She was given tips to incorporate more activity into her daily routine - take stairs when possible, park farther away from grocery  stores, etc.    Maximino Greenland, MD    THE PATIENT IS ENCOURAGED TO PRACTICE SOCIAL DISTANCING DUE TO THE COVID-19 PANDEMIC.

## 2019-04-03 LAB — HM DIABETES EYE EXAM

## 2019-04-25 ENCOUNTER — Encounter: Payer: Self-pay | Admitting: Internal Medicine

## 2019-04-29 ENCOUNTER — Ambulatory Visit: Payer: Medicare Other | Admitting: Internal Medicine

## 2019-06-05 ENCOUNTER — Other Ambulatory Visit: Payer: Self-pay

## 2019-06-05 DIAGNOSIS — E785 Hyperlipidemia, unspecified: Secondary | ICD-10-CM

## 2019-06-05 MED ORDER — ATORVASTATIN CALCIUM 20 MG PO TABS: 20.0000 mg | ORAL_TABLET | Freq: Every day | ORAL | 1 refills | Status: DC

## 2019-06-05 MED ORDER — HYDRALAZINE HCL 25 MG PO TABS: ORAL_TABLET | ORAL | 1 refills | Status: DC

## 2019-06-05 MED ORDER — AMLODIPINE BESYLATE 10 MG PO TABS: 10.0000 mg | ORAL_TABLET | Freq: Every day | ORAL | 1 refills | Status: DC

## 2019-06-12 DIAGNOSIS — N183 Chronic kidney disease, stage 3 unspecified: Secondary | ICD-10-CM | POA: Diagnosis not present

## 2019-06-12 DIAGNOSIS — N2581 Secondary hyperparathyroidism of renal origin: Secondary | ICD-10-CM | POA: Diagnosis not present

## 2019-06-12 DIAGNOSIS — D631 Anemia in chronic kidney disease: Secondary | ICD-10-CM | POA: Diagnosis not present

## 2019-06-12 DIAGNOSIS — N184 Chronic kidney disease, stage 4 (severe): Secondary | ICD-10-CM | POA: Diagnosis not present

## 2019-06-12 DIAGNOSIS — I129 Hypertensive chronic kidney disease with stage 1 through stage 4 chronic kidney disease, or unspecified chronic kidney disease: Secondary | ICD-10-CM | POA: Diagnosis not present

## 2019-06-12 DIAGNOSIS — E1122 Type 2 diabetes mellitus with diabetic chronic kidney disease: Secondary | ICD-10-CM | POA: Diagnosis not present

## 2019-09-02 ENCOUNTER — Other Ambulatory Visit: Payer: Self-pay

## 2019-09-02 DIAGNOSIS — E785 Hyperlipidemia, unspecified: Secondary | ICD-10-CM

## 2019-09-02 DIAGNOSIS — I10 Essential (primary) hypertension: Secondary | ICD-10-CM

## 2019-09-02 MED ORDER — FUROSEMIDE 80 MG PO TABS: ORAL_TABLET | ORAL | 0 refills | Status: DC

## 2019-09-02 MED ORDER — ATORVASTATIN CALCIUM 20 MG PO TABS: 20.0000 mg | ORAL_TABLET | Freq: Every day | ORAL | 1 refills | Status: DC

## 2019-09-02 MED ORDER — AMLODIPINE BESYLATE 10 MG PO TABS: 10.0000 mg | ORAL_TABLET | Freq: Every day | ORAL | 1 refills | Status: DC

## 2019-09-03 ENCOUNTER — Other Ambulatory Visit: Payer: Self-pay

## 2019-09-03 MED ORDER — INSULIN DETEMIR 100 UNIT/ML FLEXPEN: PEN_INJECTOR | SUBCUTANEOUS | 3 refills | Status: DC

## 2019-11-05 DIAGNOSIS — E1122 Type 2 diabetes mellitus with diabetic chronic kidney disease: Secondary | ICD-10-CM | POA: Diagnosis not present

## 2019-11-05 DIAGNOSIS — N2581 Secondary hyperparathyroidism of renal origin: Secondary | ICD-10-CM | POA: Diagnosis not present

## 2019-11-05 DIAGNOSIS — D631 Anemia in chronic kidney disease: Secondary | ICD-10-CM | POA: Diagnosis not present

## 2019-11-05 DIAGNOSIS — I129 Hypertensive chronic kidney disease with stage 1 through stage 4 chronic kidney disease, or unspecified chronic kidney disease: Secondary | ICD-10-CM | POA: Diagnosis not present

## 2019-11-05 DIAGNOSIS — N184 Chronic kidney disease, stage 4 (severe): Secondary | ICD-10-CM | POA: Diagnosis not present

## 2019-12-04 ENCOUNTER — Other Ambulatory Visit: Payer: Self-pay

## 2019-12-04 DIAGNOSIS — I10 Essential (primary) hypertension: Secondary | ICD-10-CM

## 2019-12-04 DIAGNOSIS — E785 Hyperlipidemia, unspecified: Secondary | ICD-10-CM

## 2019-12-04 MED ORDER — INSULIN DETEMIR 100 UNIT/ML FLEXPEN: PEN_INJECTOR | SUBCUTANEOUS | 3 refills | Status: DC

## 2019-12-04 MED ORDER — AMLODIPINE BESYLATE 10 MG PO TABS: 10.0000 mg | ORAL_TABLET | Freq: Every day | ORAL | 1 refills | Status: DC

## 2019-12-04 MED ORDER — HYDRALAZINE HCL 25 MG PO TABS: ORAL_TABLET | ORAL | 1 refills | Status: DC

## 2019-12-04 MED ORDER — FUROSEMIDE 80 MG PO TABS: ORAL_TABLET | ORAL | 0 refills | Status: DC

## 2019-12-04 MED ORDER — ATORVASTATIN CALCIUM 20 MG PO TABS: 20.0000 mg | ORAL_TABLET | Freq: Every day | ORAL | 1 refills | Status: DC

## 2019-12-13 DIAGNOSIS — N184 Chronic kidney disease, stage 4 (severe): Secondary | ICD-10-CM | POA: Diagnosis not present

## 2020-01-30 ENCOUNTER — Ambulatory Visit: Payer: Medicare Other

## 2020-01-30 ENCOUNTER — Other Ambulatory Visit: Payer: Self-pay

## 2020-01-30 ENCOUNTER — Encounter: Payer: Medicare Other | Admitting: Internal Medicine

## 2020-01-30 ENCOUNTER — Ambulatory Visit (INDEPENDENT_AMBULATORY_CARE_PROVIDER_SITE_OTHER): Payer: Medicare Other

## 2020-01-30 ENCOUNTER — Ambulatory Visit (INDEPENDENT_AMBULATORY_CARE_PROVIDER_SITE_OTHER): Payer: Medicare Other | Admitting: Nurse Practitioner

## 2020-01-30 ENCOUNTER — Encounter: Payer: Self-pay | Admitting: Nurse Practitioner

## 2020-01-30 VITALS — BP 160/82 | HR 97 | Temp 98.4°F | Ht 60.0 in | Wt 157.0 lb

## 2020-01-30 VITALS — BP 160/82 | HR 92 | Temp 98.4°F | Ht 60.0 in | Wt 157.4 lb

## 2020-01-30 DIAGNOSIS — R413 Other amnesia: Secondary | ICD-10-CM

## 2020-01-30 DIAGNOSIS — Z0000 Encounter for general adult medical examination without abnormal findings: Secondary | ICD-10-CM | POA: Diagnosis not present

## 2020-01-30 DIAGNOSIS — I5032 Chronic diastolic (congestive) heart failure: Secondary | ICD-10-CM

## 2020-01-30 DIAGNOSIS — I10 Essential (primary) hypertension: Secondary | ICD-10-CM

## 2020-01-30 DIAGNOSIS — N184 Chronic kidney disease, stage 4 (severe): Secondary | ICD-10-CM

## 2020-01-30 DIAGNOSIS — R21 Rash and other nonspecific skin eruption: Secondary | ICD-10-CM | POA: Diagnosis not present

## 2020-01-30 DIAGNOSIS — E1122 Type 2 diabetes mellitus with diabetic chronic kidney disease: Secondary | ICD-10-CM | POA: Diagnosis not present

## 2020-01-30 DIAGNOSIS — Z794 Long term (current) use of insulin: Secondary | ICD-10-CM

## 2020-01-30 LAB — POCT URINALYSIS DIPSTICK
Bilirubin, UA: NEGATIVE
Blood, UA: NEGATIVE
Glucose, UA: NEGATIVE
Ketones, UA: NEGATIVE
Leukocytes, UA: NEGATIVE
Nitrite, UA: NEGATIVE
Protein, UA: POSITIVE — AB
Spec Grav, UA: 1.015 (ref 1.010–1.025)
Urobilinogen, UA: 0.2 E.U./dL
pH, UA: 5.5 (ref 5.0–8.0)

## 2020-01-30 LAB — POCT UA - MICROALBUMIN
Albumin/Creatinine Ratio, Urine, POC: >300
Creatinine, POC: 100 mg/dL
Microalbumin Ur, POC: 150 mg/L

## 2020-01-30 MED ORDER — CLONIDINE HCL 0.1 MG PO TABS: 0.1000 mg | ORAL_TABLET | Freq: Three times a day (TID) | ORAL | 1 refills | Status: DC

## 2020-01-30 MED ORDER — MOMETASONE FUROATE 0.1 % EX CREA: 1.0000 "application " | TOPICAL_CREAM | Freq: Every day | CUTANEOUS | 0 refills | Status: DC

## 2020-01-30 NOTE — Patient Instructions (Signed)
[name] [name] , Thank you for taking time to come for your Medicare Wellness Visit. I appreciate your ongoing commitment to your health goals. Please review the following plan we discussed and let me know if I can assist you in the future.   Screening recommendations/referrals: Colonoscopy: not required  Mammogram: not required Bone Density: decline Recommended yearly ophthalmology/optometry visit for glaucoma screening and checkup Recommended yearly dental visit for hygiene and checkup  Vaccinations: Influenza vaccine: decline Pneumococcal vaccine: completed 12/26/2017 Tdap vaccine: completed 07/16/2012, due 07/16/2022 Shingles vaccine: discussed   Covid-19:decline  Advanced directives: Advance directive discussed with you today. Even though you declined this today please call our office should you change your mind and we can give you the proper paperwork for you to fill out.   Conditions/risks identified: obesity  Next appointment: 04/30/2020 at 10:45 Follow up in one year for your annual wellness visit    Preventive Care 65 Years and Older, Female Preventive care refers to lifestyle choices and visits with your health care provider that can promote health and wellness. What does preventive care include?  A yearly physical exam. This is also called an annual well check.  Dental exams once or twice a year.  Routine eye exams. Ask your health care provider how often you should have your eyes checked.  Personal lifestyle choices, including:  Daily care of your teeth and gums.  Regular physical activity.  Eating a healthy diet.  Avoiding tobacco and drug use.  Limiting alcohol use.  Practicing safe sex.  Taking low-dose aspirin every day.  Taking vitamin and mineral supplements as recommended by your health care provider. What happens during an annual well check? The services and screenings done by your health care provider during your annual well check will depend on your  age, overall health, lifestyle risk factors, and family history of disease. Counseling  Your health care provider may ask you questions about your:  Alcohol use.  Tobacco use.  Drug use.  Emotional well-being.  Home and relationship well-being.  Sexual activity.  Eating habits.  History of falls.  Memory and ability to understand (cognition).  Work and work Statistician.  Reproductive health. Screening  You may have the following tests or measurements:  Height, weight, and BMI.  Blood pressure.  Lipid and cholesterol levels. These may be checked every 5 years, or more frequently if you are over 22 years old.  Skin check.  Lung cancer screening. You may have this screening every year starting at age 69 if you have a 30-pack-year history of smoking and currently smoke or have quit within the past 15 years.  Fecal occult blood test (FOBT) of the stool. You may have this test every year starting at age 18.  Flexible sigmoidoscopy or colonoscopy. You may have a sigmoidoscopy every 5 years or a colonoscopy every 10 years starting at age 68.  Hepatitis C blood test.  Hepatitis B blood test.  Sexually transmitted disease (STD) testing.  Diabetes screening. This is done by checking your blood sugar (glucose) after you have not eaten for a while (fasting). You may have this done every 1-3 years.  Bone density scan. This is done to screen for osteoporosis. You may have this done starting at age 1.  Mammogram. This may be done every 1-2 years. Talk to your health care provider about how often you should have regular mammograms. Talk with your health care provider about your test results, treatment options, and if necessary, the need for more tests.  Vaccines  Your health care provider may recommend certain vaccines, such as:  Influenza vaccine. This is recommended every year.  Tetanus, diphtheria, and acellular pertussis (Tdap, Td) vaccine. You may need a Td booster  every 10 years.  Zoster vaccine. You may need this after age 45.  Pneumococcal 13-valent conjugate (PCV13) vaccine. One dose is recommended after age 84.  Pneumococcal polysaccharide (PPSV23) vaccine. One dose is recommended after age 36. Talk to your health care provider about which screenings and vaccines you need and how often you need them. This information is not intended to replace advice given to you by your health care provider. Make sure you discuss any questions you have with your health care provider. Document Released: 08/21/2015 Document Revised: 04/13/2016 Document Reviewed: 05/26/2015 Elsevier Interactive Patient Education  2017 [employer] Prevention in the Home Falls can cause injuries. They can happen to people of all ages. There are many things you can do to make your home safe and to help prevent falls. What can I do on the outside of my home?  Regularly fix the edges of walkways and driveways and fix any cracks.  Remove anything that might make you trip as you walk through a door, such as a raised step or threshold.  Trim any bushes or trees on the path to your home.  Use bright outdoor lighting.  Clear any walking paths of anything that might make someone trip, such as rocks or tools.  Regularly check to see if handrails are loose or broken. Make sure that both sides of any steps have handrails.  Any raised decks and porches should have guardrails on the edges.  Have any leaves, snow, or ice cleared regularly.  Use sand or salt on walking paths during winter.  Clean up any spills in your garage right away. This includes oil or grease spills. What can I do in the bathroom?  Use night lights.  Install grab bars by the toilet and in the tub and shower. Do not use towel bars as grab bars.  Use non-skid mats or decals in the tub or shower.  If you need to sit down in the shower, use a plastic, non-slip stool.  Keep the floor dry. Clean up any  water that spills on the floor as soon as it happens.  Remove soap buildup in the tub or shower regularly.  Attach bath mats securely with double-sided non-slip rug tape.  Do not have throw rugs and other things on the floor that can make you trip. What can I do in the bedroom?  Use night lights.  Make sure that you have a light by your bed that is easy to reach.  Do not use any sheets or blankets that are too big for your bed. They should not hang down onto the floor.  Have a firm chair that has side arms. You can use this for support while you get dressed.  Do not have throw rugs and other things on the floor that can make you trip. What can I do in the kitchen?  Clean up any spills right away.  Avoid walking on wet floors.  Keep items that you use a lot in easy-to-reach places.  If you need to reach something above you, use a strong step stool that has a grab bar.  Keep electrical cords out of the way.  Do not use floor polish or wax that makes floors slippery. If you must use wax, use non-skid floor wax.  Do not have throw rugs and other things on the floor that can make you trip. What can I do with my stairs?  Do not leave any items on the stairs.  Make sure that there are handrails on both sides of the stairs and use them. Fix handrails that are broken or loose. Make sure that handrails are as [place] as the stairways.  Check any carpeting to make sure that it is firmly attached to the stairs. Fix any carpet that is loose or worn.  Avoid having throw rugs at the top or bottom of the stairs. If you do have throw rugs, attach them to the floor with carpet tape.  Make sure that you have a light switch at the top of the stairs and the bottom of the stairs. If you do not have them, ask someone to add them for you. What else can I do to help prevent falls?  Wear shoes that:  Do not have high heels.  Have rubber bottoms.  Are comfortable and fit you well.  Are closed  at the toe. Do not wear sandals.  If you use a stepladder:  Make sure that it is fully opened. Do not climb a closed stepladder.  Make sure that both sides of the stepladder are locked into place.  Ask someone to hold it for you, if possible.  Clearly mark and make sure that you can see:  Any grab bars or handrails.  First and last steps.  Where the edge of each step is.  Use tools that help you move around (mobility aids) if they are needed. These include:  Canes.  Walkers.  Scooters.  Crutches.  Turn on the lights when you go into a dark area. Replace any light bulbs as soon as they burn out.  Set up your furniture so you have a clear path. Avoid moving your furniture around.  If any of your floors are uneven, fix them.  If there are any pets around you, be aware of where they are.  Review your medicines with your doctor. Some medicines can make you feel dizzy. This can increase your chance of falling. Ask your doctor what other things that you can do to help prevent falls. This information is not intended to replace advice given to you by your health care provider. Make sure you discuss any questions you have with your health care provider. Document Released: 05/21/2009 Document Revised: 12/31/2015 Document Reviewed: 08/29/2014 Elsevier Interactive Patient Education  2017 [employer].

## 2020-01-30 NOTE — Addendum Note (Signed)
Addended by: Kellie Simmering on: 01/30/2020 04:54 PM   Modules accepted: Orders

## 2020-01-30 NOTE — Progress Notes (Signed)
This visit occurred during the SARS-CoV-2 public health emergency.  Safety protocols were in place, including screening questions prior to the visit, additional usage of staff PPE, and extensive cleaning of exam room while observing appropriate contact time as indicated for disinfecting solutions.  Subjective:   [name] [name] is a 84 y.o. female who presents for Medicare Annual (Subsequent) preventive examination.  Review of Systems    n/a Cardiac Risk Factors include: advanced age (>45mn, >>50women);diabetes mellitus;hypertension;obesity (BMI >30kg/m2);sedentary lifestyle     Objective:    Today's Vitals   01/30/20 1405  BP: (!) 160/82  Pulse: 92  Temp: 98.4 F (36.9 C)  TempSrc: Oral  Weight: 157 lb 6.4 oz (71.4 kg)  Height: 5' (1.524 m)  PF: 97 L/min   Body mass index is 30.74 kg/m.  Advanced Directives 01/30/2020 01/24/2019 05/25/2015 11/22/2014 01/11/2014  Does Patient Have a Medical Advance Directive? No No No No;Yes Patient does not have advance directive;Patient would not like information  Type of Advance Directive - - - [place]-  Does patient want to make changes to medical advance directive? - - - No - Patient declined -  Copy of [place]? - - - No - copy requested -  Would patient like information on creating a medical advance directive? No - Patient declined - - - -  Pre-existing out of facility DNR order (yellow form or pink MOST form) - - - - No    Current Medications (verified) Outpatient Encounter Medications as of 01/30/2020  Medication Sig  . acetaminophen (TYLENOL) 500 MG tablet Take 1,000 mg by mouth every 8 (eight) hours as needed for mild pain.  .Marland KitchenamLODipine (NORVASC) 10 MG tablet Take 1 tablet (10 mg total) by mouth daily.  .Marland Kitchenaspirin 325 MG tablet Take 325 mg by mouth daily. For stroke prevention with history of TIA  . atorvastatin (LIPITOR) 20 MG tablet Take 1 tablet (20 mg total) by mouth daily  at 6 PM. For cholesterol  . blood glucose meter kit and supplies KIT Dispense based on patient and insurance preference. Use up to four times daily as directed. (FOR ICD-9 250.00, 250.01).  .Marland KitchendiphenhydrAMINE (BENADRYL) 25 MG tablet Take 25 mg by mouth every 6 (six) hours as needed.  . furosemide (LASIX) 80 MG tablet TAKE 2 TABLETS(160 MG) BY MOUTH TWICE DAILY  . glucose blood test strip Use as instructed  . hydrALAZINE (APRESOLINE) 25 MG tablet TAKE 1 TABLET BY MOUTH THREE TIMES A DAY WITH FOOD  . insulin detemir (LEVEMIR) 100 UNIT/ML FlexPen Inject 30 units with breakfast and 15 units before bedtime.  .Marland KitchenRAYALDEE 30 MCG CPCR Take 1 capsule by mouth daily.  . [DISCONTINUED] cloNIDine (CATAPRES) 0.1 MG tablet TAKE 1 TABLET BY MOUTH THREE TIMES DAILY  . cetirizine (ZYRTEC) 10 MG tablet Take 10 mg by mouth daily.   No facility-administered encounter medications on file as of 01/30/2020.    Allergies (verified) Penicillins   History: Past Medical History:  Diagnosis Date  . Arthritis    RA  . CHF (congestive heart failure) ([place]   . CKD (chronic kidney disease) stage 3, GFR 30-59 ml/min   . Diabetes mellitus without complication (HCC)    insulin dependent  . Hyperlipidemia   . Hypertension   . Shortness of breath dyspnea   . Sleep apnea    cpap  . TIA (transient ischemic attack) 2015   Past Surgical History:  Procedure Laterality Date  .  ABDOMINAL HYSTERECTOMY  1974   Family History  Problem Relation Age of Onset  . Diabetes type II Mother   . Hypertension Mother   . Diabetes type II Father   . Hypertension Other   . Hyperlipidemia Other   . Stroke Other    Social History   Socioeconomic History  . Marital status: Married    Spouse name: Not on file  . Number of children: 6  . Years of education: 12th  . Highest education level: Not on file  Occupational History  . Occupation: retired    Fish farm manager: RETIRED  Tobacco Use  . Smoking status: Never Smoker  . Smokeless  tobacco: Never Used  Vaping Use  . Vaping Use: Never used  Substance and Sexual Activity  . Alcohol use: No    Alcohol/week: 0.0 standard drinks  . Drug use: No  . Sexual activity: Not Currently  Other Topics Concern  . Not on file  Social History Narrative   Patient lives with her husband    Patient is right handed   Patient drinks tea   Social Determinants of Health   Financial Resource Strain: Low Risk   . Difficulty of Paying Living Expenses: Not hard at all  Food Insecurity: No Food Insecurity  . Worried About Charity fundraiser in the Last Year: Never true  . Ran Out of Food in the Last Year: Never true  Transportation Needs: No Transportation Needs  . Lack of Transportation (Medical): No  . Lack of Transportation (Non-Medical): No  Physical Activity: Inactive  . Days of Exercise per Week: 0 days  . Minutes of Exercise per Session: 0 min  Stress: No Stress Concern Present  . Feeling of Stress : Not at all  Social Connections:   . Frequency of Communication with Friends and Family:   . Frequency of Social Gatherings with Friends and Family:   . Attends Religious Services:   . Active Member of Clubs or Organizations:   . Attends Archivist Meetings:   Marland Kitchen Marital Status:     Tobacco Counseling Counseling given: Not Answered   Clinical Intake:  Pre-visit preparation completed: Yes  Pain : No/denies pain     Nutritional Status: BMI > 30  Obese Nutritional Risks: None Diabetes: Yes CBG done?: No Did pt. bring in CBG monitor from home?: No  How often do you need to have someone help you when you read instructions, pamphlets, or other written materials from your doctor or pharmacy?: 1 - Never What is the last grade level you completed in school?: 12th grade  Diabetic? Yes Nutrition Risk Assessment:  Has the patient had any N/V/D within the last 2 months?  No  Does the patient have any non-healing wounds?  No  Has the patient had any  unintentional weight loss or weight gain?  No   Diabetes:  Is the patient diabetic?  Yes  If diabetic, was a CBG obtained today?  No  Did the patient bring in their glucometer from home?  No  How often do you monitor your CBG's? Check daily.   Financial Strains and Diabetes Management:  Are you having any financial strains with the device, your supplies or your medication? No .  Does the patient want to be seen by Chronic Care Management for management of their diabetes?  No  Would the patient like to be referred to a Nutritionist or for Diabetic Management?  No   Diabetic Exams:  Diabetic Eye Exam: Completed  this year Diabetic Foot Exam: Overdue, Pt has been advised about the importance in completing this exam. Pt is scheduled for diabetic foot exam on next appointment.   Interpreter Needed?: No  Information entered by :: NAllen LPN   Activities of Daily Living In your present state of health, do you have any difficulty performing the following activities: 01/30/2020  Hearing? N  Vision? N  Difficulty concentrating or making decisions? N  Walking or climbing stairs? N  Dressing or bathing? N  Doing errands, shopping? N  Preparing Food and eating ? N  Using the Toilet? N  In the past six months, have you accidently leaked urine? N  Do you have problems with loss of bowel control? N  Managing your Medications? Y  Comment daughter manages  Managing your Finances? Y  Comment daughter Engineer, production or managing your Housekeeping? N  Some recent data might be hidden    Patient Care Team: [name], MD as PCP - General (Internal Medicine)  Indicate any recent Medical Services you may have received from other than [facility] providers in the past year (date may be approximate).     Assessment:   This is a routine wellness examination for Abiageal.  Hearing/Vision screen  Hearing Screening   '125Hz'  '250Hz'  '500Hz'  '1000Hz'  '2000Hz'  '3000Hz'  '4000Hz'  '6000Hz'  '8000Hz'   Right ear:            Left ear:           Vision Screening Comments: Regular eye exams. [facility]  Dietary issues and exercise activities discussed: Current Exercise Habits: The patient does not participate in regular exercise at present  Goals    . Patient Stated     No goals    . Patient Stated     01/30/2020, no goals      Depression Screen PHQ 2/9 Scores 01/30/2020 01/24/2019 01/23/2019 09/27/2018 06/27/2018  PHQ - 2 Score 0 0 0 0 0  PHQ- 9 Score 0 - - - -    Fall Risk Fall Risk  01/30/2020 01/24/2019 01/23/2019 09/27/2018 06/27/2018  Falls in the past year? 0 0 0 0 0  Risk for fall due to : Medication side effect Medication side effect - - -  Follow up Falls evaluation completed;Education provided;Falls prevention discussed Falls prevention discussed - - -    Any stairs in or around the home? No  If so, are there any without handrails? n/a Home free of loose throw rugs in walkways, pet beds, electrical cords, etc? Yes  Adequate lighting in your home to reduce risk of falls? Yes   ASSISTIVE DEVICES UTILIZED TO PREVENT FALLS:  Life alert? No  Use of a cane, walker or w/c? No  Grab bars in the bathroom? Yes  Shower chair or bench in shower? Yes  Elevated toilet seat or a handicapped toilet? No   TIMED UP AND GO:  Was the test performed? No .  .   Gait steady and fast without use of assistive device  Cognitive Function:     6CIT Screen 01/30/2020 01/24/2019  What Year? 0 points 0 points  What month? 0 points 0 points  What time? 0 points 0 points  Count back from 20 0 points 0 points  Months in reverse 4 points 0 points  Repeat phrase 6 points 0 points  Total Score 10 0    Immunizations Immunization History  Administered Date(s) Administered  . DTaP 07/16/2012  . Pneumococcal Conjugate-13 12/26/2017  . Pneumococcal-Unspecified 07/16/2012  TDAP status: Up to date Flu Vaccine status: Declined, Education has been provided regarding the importance of this  vaccine but patient still declined. Advised may receive this vaccine at local pharmacy or Health Dept. Aware to provide a copy of the vaccination record if obtained from local pharmacy or Health Dept. Verbalized acceptance and understanding. Pneumococcal vaccine status: Up to date Covid-19 vaccine status: Declined, Education has been provided regarding the importance of this vaccine but patient still declined. Advised may receive this vaccine at local pharmacy or Health Dept.or vaccine clinic. Aware to provide a copy of the vaccination record if obtained from local pharmacy or Health Dept. Verbalized acceptance and understanding.  Qualifies for Shingles Vaccine? Yes   Zostavax completed No   Shingrix Completed?: No.    Education has been provided regarding the importance of this vaccine. Patient has been advised to call insurance company to determine out of pocket expense if they have not yet received this vaccine. Advised may also receive vaccine at local pharmacy or Health Dept. Verbalized acceptance and understanding.  Screening Tests Health Maintenance  Topic Date Due  . HEMOGLOBIN A1C  07/25/2019  . FOOT EXAM  01/23/2020  . COVID-19 Vaccine (1) 02/15/2020 (Originally 11/29/1947)  . DEXA SCAN  01/29/2021 (Originally 11/30/2002)  . INFLUENZA VACCINE  03/08/2020  . OPHTHALMOLOGY EXAM  04/02/2020  . TETANUS/TDAP  07/16/2022  . PNA vac Low Risk Adult  Completed    Health Maintenance  Health Maintenance Due  Topic Date Due  . HEMOGLOBIN A1C  07/25/2019  . FOOT EXAM  01/23/2020    Colorectal cancer screening: No longer required.  Mammogram status: No longer required.  Bone Density status: Declines  Lung Cancer Screening: (Low Dose CT Chest recommended if Age 92-80 years, 30 pack-year currently smoking OR have quit w/in 15years.) does not qualify.   Lung Cancer Screening Referral: no  Additional Screening:  Hepatitis C Screening: does not qualify;   Vision Screening: Recommended  annual ophthalmology exams for early detection of glaucoma and other disorders of the eye. Is the patient up to date with their annual eye exam?  Yes  Who is the provider or what is the name of the office in which the patient attends annual eye exams? [facility] If pt is not established with a provider, would they like to be referred to a provider to establish care? No .   Dental Screening: Recommended annual dental exams for proper oral hygiene  Community Resource Referral / Chronic Care Management: CRR required this visit?  No   CCM required this visit?  No      Plan:     I have personally reviewed and noted the following in the patient's chart:   . Medical and social history . Use of alcohol, tobacco or illicit drugs  . Current medications and supplements . Functional ability and status . Nutritional status . Physical activity . Advanced directives . List of other physicians . Hospitalizations, surgeries, and ER visits in previous 12 months . Vitals . Screenings to include cognitive, depression, and falls . Referrals and appointments  In addition, I have reviewed and discussed with patient certain preventive protocols, quality metrics, and best practice recommendations. A written personalized care plan for preventive services as well as general preventive health recommendations were provided to patient.     Kellie Simmering, LPN   1/[id number]/[id number]   Nurse Notes: Patient has not taken clonidine since January. She complains of an itchy rash on both arms. 6 CIT  was a 10. PCP made aware and she is seeing her next

## 2020-01-30 NOTE — Patient Instructions (Signed)
Health Maintenance After Age 82 After age 82, you are at a higher risk for certain [place]-term diseases and infections as well as injuries from falls. Falls are a major cause of broken bones and head injuries in people who are older than age 82. Getting regular preventive care can help to keep you healthy and well. Preventive care includes getting regular testing and making lifestyle changes as recommended by your health care provider. Talk with your health care provider about:  Which screenings and tests you should have. A screening is a test that checks for a disease when you have no symptoms.  A diet and exercise plan that is right for you. What should I know about screenings and tests to prevent falls? Screening and testing are the best ways to find a health problem early. Early diagnosis and treatment give you the best chance of managing medical conditions that are common after age 82. Certain conditions and lifestyle choices may make you more likely to have a fall. Your health care provider may recommend:  Regular vision checks. Poor vision and conditions such as cataracts can make you more likely to have a fall. If you wear glasses, make sure to get your prescription updated if your vision changes.  Medicine review. Work with your health care provider to regularly review all of the medicines you are taking, including over-the-counter medicines. Ask your health care provider about any side effects that may make you more likely to have a fall. Tell your health care provider if any medicines that you take make you feel dizzy or sleepy.  Osteoporosis screening. Osteoporosis is a condition that causes the bones to get weaker. This can make the bones weak and cause them to break more easily.  Blood pressure screening. Blood pressure changes and medicines to control blood pressure can make you feel dizzy.  Strength and balance checks. Your health care provider may recommend certain tests to check your  strength and balance while standing, walking, or changing positions.  Foot health exam. Foot pain and numbness, as well as not wearing proper footwear, can make you more likely to have a fall.  Depression screening. You may be more likely to have a fall if you have a fear of falling, feel emotionally low, or feel unable to do activities that you used to do.  Alcohol use screening. Using too much alcohol can affect your balance and may make you more likely to have a fall. What actions can I take to lower my risk of falls? General instructions  Talk with your health care provider about your risks for falling. Tell your health care provider if: ? You fall. Be sure to tell your health care provider about all falls, even ones that seem minor. ? You feel dizzy, sleepy, or off-balance.  Take over-the-counter and prescription medicines only as told by your health care provider. These include any supplements.  Eat a healthy diet and maintain a healthy weight. A healthy diet includes low-fat dairy products, low-fat (lean) meats, and fiber from whole grains, beans, and lots of fruits and vegetables. Home safety  Remove any tripping hazards, such as rugs, cords, and clutter.  Install safety equipment such as grab bars in bathrooms and safety rails on stairs.  Keep rooms and walkways well-lit. Activity   Follow a regular exercise program to stay fit. This will help you maintain your balance. Ask your health care provider what types of exercise are appropriate for you.  If you need a cane or   walker, use it as recommended by your health care provider.  Wear supportive shoes that have nonskid soles. Lifestyle  Do not drink alcohol if your health care provider tells you not to drink.  If you drink alcohol, limit how much you have: ? 0-1 drink a day for women. ? 0-2 drinks a day for men.  Be aware of how much alcohol is in your drink. In the [place] one drink equals one typical bottle of beer (12  oz), one-half glass of wine (5 oz), or one shot of hard liquor (1 oz).  Do not use any products that contain nicotine or tobacco, such as cigarettes and e-cigarettes. If you need help quitting, ask your health care provider. Summary  Having a healthy lifestyle and getting preventive care can help to protect your health and wellness after age 82.  Screening and testing are the best way to find a health problem early and help you avoid having a fall. Early diagnosis and treatment give you the best chance for managing medical conditions that are more common for people who are older than age 82.  Falls are a major cause of broken bones and head injuries in people who are older than age 82. Take precautions to prevent a fall at home.  Work with your health care provider to learn what changes you can make to improve your health and wellness and to prevent falls. This information is not intended to replace advice given to you by your health care provider. Make sure you discuss any questions you have with your health care provider. Document Revised: 11/15/2018 Document Reviewed: 06/07/2017 Elsevier Patient Education  2020 [employer]  

## 2020-01-31 LAB — CMP14+EGFR
ALT: 6 IU/L (ref 0–32)
AST: 17 IU/L (ref 0–40)
Albumin/Globulin Ratio: 1.1 — ABNORMAL LOW (ref 1.2–2.2)
Albumin: 4 g/dL (ref 3.6–4.6)
Alkaline Phosphatase: 104 IU/L (ref 48–121)
BUN/Creatinine Ratio: 13 (ref 12–28)
BUN: 41 mg/dL — ABNORMAL HIGH (ref 8–27)
Bilirubin Total: 0.2 mg/dL (ref 0.0–1.2)
CO2: 22 mmol/L (ref 20–29)
Calcium: 8.7 mg/dL (ref 8.7–10.3)
Chloride: 102 mmol/L (ref 96–106)
Creatinine, Ser: 3.09 mg/dL — ABNORMAL HIGH (ref 0.57–1.00)
GFR calc Af Amer: 15 mL/min/{1.73_m2} — ABNORMAL LOW (ref >59)
GFR calc non Af Amer: 13 mL/min/{1.73_m2} — ABNORMAL LOW (ref >59)
Globulin, Total: 3.8 g/dL (ref 1.5–4.5)
Glucose: 151 mg/dL — ABNORMAL HIGH (ref 65–99)
Potassium: 3.7 mmol/L (ref 3.5–5.2)
Sodium: 139 mmol/L (ref 134–144)
Total Protein: 7.8 g/dL (ref 6.0–8.5)

## 2020-01-31 LAB — T PALLIDUM SCREENING CASCADE: T pallidum Antibodies (TP-PA): NONREACTIVE

## 2020-01-31 LAB — CBC
Hematocrit: 30.6 % — ABNORMAL LOW (ref 34.0–46.6)
Hemoglobin: 9.1 g/dL — ABNORMAL LOW (ref 11.1–15.9)
MCH: 21.3 pg — ABNORMAL LOW (ref 26.6–33.0)
MCHC: 29.7 g/dL — ABNORMAL LOW (ref 31.5–35.7)
MCV: 72 fL — ABNORMAL LOW (ref 79–97)
Platelets: 251 10*3/uL (ref 150–450)
RBC: 4.28 x10E6/uL (ref 3.77–5.28)
RDW: 15.7 % — ABNORMAL HIGH (ref 11.7–15.4)
WBC: 5.8 10*3/uL (ref 3.4–10.8)

## 2020-01-31 LAB — LIPID PANEL
Chol/HDL Ratio: 2.6 ratio (ref 0.0–4.4)
Cholesterol, Total: 161 mg/dL (ref 100–199)
HDL: 63 mg/dL (ref >39)
LDL Chol Calc (NIH): 74 mg/dL (ref 0–99)
Triglycerides: 140 mg/dL (ref 0–149)
VLDL Cholesterol Cal: 24 mg/dL (ref 5–40)

## 2020-01-31 LAB — HEMOGLOBIN A1C
Est. average glucose Bld gHb Est-mCnc: 169 mg/dL
Hgb A1c MFr Bld: 7.5 % — ABNORMAL HIGH (ref 4.8–5.6)

## 2020-01-31 LAB — VITAMIN B12: Vitamin B-12: 523 pg/mL (ref 232–1245)

## 2020-01-31 LAB — TSH: TSH: 0.925 u[IU]/mL (ref 0.450–4.500)

## 2020-02-23 NOTE — Progress Notes (Signed)
This visit occurred during the SARS-CoV-2 public health emergency.  Safety protocols were in place, including screening questions prior to the visit, additional usage of staff PPE, and extensive cleaning of exam room while observing appropriate contact time as indicated for disinfecting solutions.  Subjective:     Patient ID: [name] [name] , female    DOB: Aug 15, 1935 , 84 y.o.   MRN: [id number]   No chief complaint on file.   HPI  Here for HM   Wt Readings from Last 3 Encounters: 01/30/20 : 157 lb (71.2 kg) 01/30/20 : 157 lb 6.4 oz (71.4 kg) 01/24/19 : 160 lb (72.6 kg)   Diabetes She presents for her follow-up diabetic visit. She has type 2 diabetes mellitus. Her disease course has been stable. There are no hypoglycemic associated symptoms. There are no diabetic associated symptoms. Pertinent negatives for diabetes include no blurred vision and no chest pain. There are no hypoglycemic complications. Symptoms are stable. There are no diabetic complications. Risk factors for coronary artery disease include diabetes mellitus, obesity and sedentary lifestyle. Current diabetic treatment includes oral agent (dual therapy) and oral agent (monotherapy). She is compliant with treatment all of the time. She is currently taking insulin at bedtime. Insulin injections are given by patient. Rotation sites for injection include the abdominal wall. Her weight is decreasing steadily. She is following a generally healthy diet. When asked about meal planning, she reported none. She participates in exercise every other day. She does not see a podiatrist.Eye exam is current.  Hypertension This is a chronic problem. The current episode started more than 1 year ago. The problem is unchanged. The problem is uncontrolled. Pertinent negatives include no anxiety, blurred vision or chest pain. There are no associated agents to hypertension. Risk factors for coronary artery disease include obesity and diabetes  mellitus. There are no compliance problems.      Past Medical History:  Diagnosis Date  . Arthritis    RA  . CHF (congestive heart failure) ([place])   . CKD (chronic kidney disease) stage 3, GFR 30-59 ml/min   . Diabetes mellitus without complication (HCC)    insulin dependent  . Hyperlipidemia   . Hypertension   . Shortness of breath dyspnea   . Sleep apnea    cpap  . TIA (transient ischemic attack) 2015     Family History  Problem Relation Age of Onset  . Diabetes type II Mother   . Hypertension Mother   . Diabetes type II Father   . Hypertension Other   . Hyperlipidemia Other   . Stroke Other      Current Outpatient Medications:  .  acetaminophen (TYLENOL) 500 MG tablet, Take 1,000 mg by mouth every 8 (eight) hours as needed for mild pain., Disp: , Rfl:  .  amLODipine (NORVASC) 10 MG tablet, Take 1 tablet (10 mg total) by mouth daily., Disp: 90 tablet, Rfl: 1 .  aspirin 325 MG tablet, Take 325 mg by mouth daily. For stroke prevention with history of TIA, Disp: , Rfl:  .  atorvastatin (LIPITOR) 20 MG tablet, Take 1 tablet (20 mg total) by mouth daily at 6 PM. For cholesterol, Disp: 90 tablet, Rfl: 1 .  blood glucose meter kit and supplies KIT, Dispense based on patient and insurance preference. Use up to four times daily as directed. (FOR ICD-9 250.00, 250.01)., Disp: 1 each, Rfl: 0 .  cetirizine (ZYRTEC) 10 MG tablet, Take 10 mg by mouth daily., Disp: , Rfl:  .  cloNIDine (  CATAPRES) 0.1 MG tablet, Take 1 tablet (0.1 mg total) by mouth 3 (three) times daily., Disp: 270 tablet, Rfl: 1 .  diphenhydrAMINE (BENADRYL) 25 MG tablet, Take 25 mg by mouth every 6 (six) hours as needed., Disp: , Rfl:  .  furosemide (LASIX) 80 MG tablet, TAKE 2 TABLETS(160 MG) BY MOUTH TWICE DAILY, Disp: 360 tablet, Rfl: 0 .  glucose blood test strip, Use as instructed, Disp: 100 each, Rfl: 12 .  hydrALAZINE (APRESOLINE) 25 MG tablet, TAKE 1 TABLET BY MOUTH THREE TIMES A DAY WITH FOOD, Disp: 270 tablet,  Rfl: 1 .  insulin detemir (LEVEMIR) 100 UNIT/ML FlexPen, Inject 30 units with breakfast and 15 units before bedtime., Disp: 15 pen, Rfl: 3 .  mometasone (ELOCON) 0.1 % cream, Apply 1 application topically daily., Disp: 45 g, Rfl: 0 .  RAYALDEE 30 MCG CPCR, Take 1 capsule by mouth daily., Disp: , Rfl:    Allergies  Allergen Reactions  . Penicillins Swelling and Rash    The patient states she uses post menopausal status for birth control. Last LMP was No LMP recorded. Patient has had a hysterectomy.. Negative for Dysmenorrhea and Negative for Menorrhagia  Negative for: breast discharge, breast lump(s), breast pain and breast self exam.  Pertinent negatives include abnormal bleeding (hematology), anxiety, decreased libido, depression, difficulty falling sleep, dyspareunia, history of infertility, nocturia, sexual dysfunction, sleep disturbances, urinary incontinence, urinary urgency, vaginal discharge and vaginal itching. Diet regular. The patient states her exercise level is moderate with walking and she will go out with her daughter    The patient's tobacco use is:  Social History   Tobacco Use  Smoking Status Never Smoker  Smokeless Tobacco Never Used  She has been exposed to passive smoke. The patient's alcohol use is:  Social History   Substance and Sexual Activity  Alcohol Use No  . Alcohol/week: 0.0 standard drinks     Review of Systems  Constitutional: Negative.   HENT: Negative.   Eyes: Negative.  Negative for blurred vision.  Respiratory: Negative.   Cardiovascular: Negative.  Negative for chest pain.  Gastrointestinal: Negative.   Endocrine: Negative.   Genitourinary: Negative.   Musculoskeletal: Negative.   Skin: Negative.   Allergic/Immunologic: Negative.   Neurological: Negative.   Hematological: Negative.   Psychiatric/Behavioral: Negative.   All other systems reviewed and are negative.    Today's Vitals   01/30/20 1441  BP: (!) 160/82  Pulse: 97  Temp:  98.4 F (36.9 C)  TempSrc: Oral  Weight: 157 lb (71.2 kg)  Height: 5' (1.524 m)   Body mass index is 30.66 kg/m.   Objective:  Physical Exam Constitutional:      General: She is not in acute distress.    Appearance: Normal appearance. She is well-developed. She is obese.  HENT:     Head: Normocephalic and atraumatic.     Right Ear: Hearing, tympanic membrane, ear canal and external ear normal. There is no impacted cerumen.     Left Ear: Hearing, tympanic membrane, ear canal and external ear normal. There is no impacted cerumen.     Nose:     Comments: Deferred - masked    Mouth/Throat:     Comments: Deferred - masked Eyes:     General: Lids are normal.     Extraocular Movements: Extraocular movements intact.     Conjunctiva/sclera: Conjunctivae normal.     Pupils: Pupils are equal, round, and reactive to light.     Funduscopic exam:  Right eye: No papilledema.        Left eye: No papilledema.  Neck:     Thyroid: No thyroid mass.     Vascular: No carotid bruit.  Cardiovascular:     Rate and Rhythm: Normal rate and regular rhythm.     Pulses: Normal pulses.     Heart sounds: Normal heart sounds. No murmur heard.   Pulmonary:     Effort: Pulmonary effort is normal. No respiratory distress.     Breath sounds: Normal breath sounds.  Abdominal:     General: Abdomen is flat. Bowel sounds are normal. There is no distension.     Palpations: Abdomen is soft.     Tenderness: There is no abdominal tenderness.  Musculoskeletal:        General: No swelling. Normal range of motion.     Cervical back: Full passive range of motion without pain, normal range of motion and neck supple.     Right lower leg: No edema.     Left lower leg: No edema.  Skin:    General: Skin is warm and dry.     Capillary Refill: Capillary refill takes less than 2 seconds.  Neurological:     General: No focal deficit present.     Mental Status: She is alert and oriented to person, place, and time.      Cranial Nerves: No cranial nerve deficit.     Sensory: No sensory deficit.  Psychiatric:        Mood and Affect: Mood normal.        Behavior: Behavior normal.        Thought Content: Thought content normal.        Judgment: Judgment normal.         Assessment And Plan:     1. Routine general medical examination at health care facility . Behavior modifications discussed and diet history reviewed.   . Pt will continue to exercise regularly and modify diet with low GI, plant based foods and decrease intake of processed foods.  . Recommend intake of daily multivitamin, Vitamin D, and calcium.  . Recommend for preventive screenings, as well as recommend immunizations that include influenza, TDAP  2. Type 2 diabetes mellitus with stage 4 chronic kidney disease, with [place]-term current use of insulin (HCC)  Chronic, fairly controlled  She has also not been to the office since last year in June  Continue with current medications  Encouraged to limit intake of sugary foods and drinks  Encouraged to increase physical activity to 150 minutes per week as tolerated - Hemoglobin A1c - CMP14+EGFR - Lipid panel - CBC  3. Essential hypertension Chronic  Blood pressure is elevated today, I have advised her to avoid salt and stay well hydrated with water avoiding drinking more than 1.5 liters - cloNIDine (CATAPRES) 0.1 MG tablet; Take 1 tablet (0.1 mg total) by mouth 3 (three) times daily.  Dispense: 270 tablet; Refill: 1 - EKG 12-Lead  4. Chronic diastolic HF (heart failure) ([place])    5. Memory changes  Will check for metabolic causes  She does score 10 on 6CIT score.   Pending results may need medications - TSH - Vitamin B12 - T pallidum Screening [place]  6. Rash and nonspecific skin eruption  Recurrent rash will treat with mometasone cream - mometasone (ELOCON) 0.1 % cream; Apply 1 application topically daily.  Dispense: 45 g; Refill: 0     Patient was given  opportunity to ask questions. Patient  verbalized understanding of the plan and was able to repeat key elements of the plan. All questions were answered to their satisfaction.   Minette Brine, FNP   I, Minette Brine, FNP, have reviewed all documentation for this visit. The documentation on 02/23/20 for the exam, diagnosis, procedures, and orders are all accurate and complete.  THE PATIENT IS ENCOURAGED TO PRACTICE SOCIAL DISTANCING DUE TO THE COVID-19 PANDEMIC.

## 2020-03-02 ENCOUNTER — Other Ambulatory Visit: Payer: Self-pay

## 2020-03-02 MED ORDER — GLUCOSE BLOOD VI STRP: ORAL_STRIP | 12 refills | Status: DC

## 2020-03-09 ENCOUNTER — Telehealth: Payer: Self-pay

## 2020-03-09 ENCOUNTER — Other Ambulatory Visit: Payer: Self-pay | Admitting: Internal Medicine

## 2020-03-09 DIAGNOSIS — I10 Essential (primary) hypertension: Secondary | ICD-10-CM

## 2020-03-09 NOTE — Telephone Encounter (Signed)
The pt's daughter Mila Palmer was given the contact information for Dr. Cannon Kettle the pt's podiatrist because she said that the pt needed her toe nails clicked.

## 2020-03-26 DIAGNOSIS — N184 Chronic kidney disease, stage 4 (severe): Secondary | ICD-10-CM | POA: Diagnosis not present

## 2020-03-26 DIAGNOSIS — I129 Hypertensive chronic kidney disease with stage 1 through stage 4 chronic kidney disease, or unspecified chronic kidney disease: Secondary | ICD-10-CM | POA: Diagnosis not present

## 2020-03-26 DIAGNOSIS — Z6836 Body mass index (BMI) 36.0-36.9, adult: Secondary | ICD-10-CM | POA: Diagnosis not present

## 2020-03-26 DIAGNOSIS — D631 Anemia in chronic kidney disease: Secondary | ICD-10-CM | POA: Diagnosis not present

## 2020-03-26 DIAGNOSIS — N2581 Secondary hyperparathyroidism of renal origin: Secondary | ICD-10-CM | POA: Diagnosis not present

## 2020-03-26 DIAGNOSIS — E1122 Type 2 diabetes mellitus with diabetic chronic kidney disease: Secondary | ICD-10-CM | POA: Diagnosis not present

## 2020-03-26 DIAGNOSIS — E785 Hyperlipidemia, unspecified: Secondary | ICD-10-CM | POA: Diagnosis not present

## 2020-04-30 ENCOUNTER — Ambulatory Visit: Payer: Medicare Other | Admitting: Internal Medicine

## 2020-05-05 ENCOUNTER — Other Ambulatory Visit: Payer: Self-pay

## 2020-05-05 ENCOUNTER — Encounter: Payer: Self-pay | Admitting: Internal Medicine

## 2020-05-05 ENCOUNTER — Ambulatory Visit (INDEPENDENT_AMBULATORY_CARE_PROVIDER_SITE_OTHER): Payer: Medicare Other | Admitting: Internal Medicine

## 2020-05-05 VITALS — BP 156/92 | HR 71 | Temp 98.2°F | Ht 60.0 in | Wt 152.8 lb

## 2020-05-05 DIAGNOSIS — N184 Chronic kidney disease, stage 4 (severe): Secondary | ICD-10-CM

## 2020-05-05 DIAGNOSIS — Z794 Long term (current) use of insulin: Secondary | ICD-10-CM

## 2020-05-05 DIAGNOSIS — E1122 Type 2 diabetes mellitus with diabetic chronic kidney disease: Secondary | ICD-10-CM

## 2020-05-05 DIAGNOSIS — E663 Overweight: Secondary | ICD-10-CM

## 2020-05-05 DIAGNOSIS — Z6829 Body mass index (BMI) 29.0-29.9, adult: Secondary | ICD-10-CM

## 2020-05-05 DIAGNOSIS — I5032 Chronic diastolic (congestive) heart failure: Secondary | ICD-10-CM

## 2020-05-05 DIAGNOSIS — I130 Hypertensive heart and chronic kidney disease with heart failure and stage 1 through stage 4 chronic kidney disease, or unspecified chronic kidney disease: Secondary | ICD-10-CM

## 2020-05-05 DIAGNOSIS — N2581 Secondary hyperparathyroidism of renal origin: Secondary | ICD-10-CM

## 2020-05-05 NOTE — Progress Notes (Signed)
I,Katawbba Wiggins,acting as a Education administrator for Maximino Greenland, MD.,have documented all relevant documentation on the behalf of Maximino Greenland, MD,as directed by  Maximino Greenland, MD while in the presence of Maximino Greenland, MD.  This visit occurred during the SARS-CoV-2 public health emergency.  Safety protocols were in place, including screening questions prior to the visit, additional usage of staff PPE, and extensive cleaning of exam room while observing appropriate contact time as indicated for disinfecting solutions.  Subjective:     Patient ID: [name] [name] , female    DOB: 07/31/1936 , 84 y.o.   MRN: [id number]   Chief Complaint  Patient presents with  . Diabetes  . Hypertension    HPI  The patient is here today for a follow-up on her diabetes and her blood pressure.  She reports compliance with meds. Reports she has had f/u with her nephrologist. States things are "good".   Diabetes She presents for her follow-up diabetic visit. She has type 2 diabetes mellitus. Her disease course has been stable. There are no hypoglycemic associated symptoms. Pertinent negatives for diabetes include no blurred vision and no chest pain. There are no hypoglycemic complications. Diabetic complications include nephropathy. Risk factors for coronary artery disease include diabetes mellitus, dyslipidemia, hypertension, obesity, post-menopausal and sedentary lifestyle.  Hypertension This is a chronic problem. The current episode started more than 1 year ago. The problem has been gradually improving since onset. The problem is controlled. Pertinent negatives include no blurred vision, chest pain, palpitations or shortness of breath.     Past Medical History:  Diagnosis Date  . Arthritis    RA  . CHF (congestive heart failure) ([place])   . CKD (chronic kidney disease) stage 3, GFR 30-59 ml/min   . Diabetes mellitus without complication (HCC)    insulin dependent  . Hyperlipidemia   .  Hypertension   . Shortness of breath dyspnea   . Sleep apnea    cpap  . TIA (transient ischemic attack) 2015     Family History  Problem Relation Age of Onset  . Diabetes type II Mother   . Hypertension Mother   . Diabetes type II Father   . Hypertension Other   . Hyperlipidemia Other   . Stroke Other      Current Outpatient Medications:  .  acetaminophen (TYLENOL) 500 MG tablet, Take 1,000 mg by mouth every 8 (eight) hours as needed for mild pain., Disp: , Rfl:  .  amLODipine (NORVASC) 10 MG tablet, Take 1 tablet (10 mg total) by mouth daily., Disp: 90 tablet, Rfl: 1 .  aspirin 325 MG tablet, Take 325 mg by mouth daily. For stroke prevention with history of TIA, Disp: , Rfl:  .  atorvastatin (LIPITOR) 20 MG tablet, Take 1 tablet (20 mg total) by mouth daily at 6 PM. For cholesterol, Disp: 90 tablet, Rfl: 1 .  blood glucose meter kit and supplies KIT, Dispense based on patient and insurance preference. Use up to four times daily as directed. (FOR ICD-9 250.00, 250.01)., Disp: 1 each, Rfl: 0 .  cetirizine (ALLERGY, CETIRIZINE,) 10 MG tablet, Take 10 mg by mouth daily., Disp: , Rfl:  .  cloNIDine (CATAPRES) 0.1 MG tablet, Take 1 tablet (0.1 mg total) by mouth 3 (three) times daily., Disp: 270 tablet, Rfl: 1 .  diphenhydrAMINE (BENADRYL) 25 MG tablet, Take 25 mg by mouth every 6 (six) hours as needed., Disp: , Rfl:  .  furosemide (LASIX) 80 MG tablet, TAKE 2  TABLETS(160 MG) BY MOUTH TWICE DAILY, Disp: 360 tablet, Rfl: 0 .  glucose blood test strip, Use as directed to check blood sugars 3 times per day dx: e11.65, Disp: 100 each, Rfl: 12 .  hydrALAZINE (APRESOLINE) 25 MG tablet, TAKE 1 TABLET BY MOUTH THREE TIMES A DAY WITH FOOD, Disp: 270 tablet, Rfl: 1 .  insulin detemir (LEVEMIR) 100 UNIT/ML FlexPen, Inject 30 units with breakfast and 15 units before bedtime., Disp: 15 pen, Rfl: 3 .  mometasone (ELOCON) 0.1 % cream, Apply 1 application topically daily., Disp: 45 g, Rfl: 0 .  RAYALDEE  30 MCG CPCR, Take 1 capsule by mouth daily., Disp: , Rfl:    Allergies  Allergen Reactions  . Penicillins Swelling and Rash     Review of Systems  Constitutional: Negative.   Eyes: Negative for blurred vision.  Respiratory: Negative.  Negative for shortness of breath.   Cardiovascular: Negative.  Negative for chest pain and palpitations.  Gastrointestinal: Negative.   Psychiatric/Behavioral: Negative.   All other systems reviewed and are negative.    Today's Vitals   05/05/20 1054  BP: (!) 156/92  Pulse: 71  Temp: 98.2 F (36.8 C)  TempSrc: Oral  Weight: 152 lb 12.8 oz (69.3 kg)  Height: 5' (1.524 m)  PainSc: 0-No pain   Body mass index is 29.84 kg/m.   Objective:  Physical Exam Vitals and nursing note reviewed.  Constitutional:      Appearance: Normal appearance. She is obese.  HENT:     Head: Normocephalic and atraumatic.  Cardiovascular:     Rate and Rhythm: Normal rate and regular rhythm.     Heart sounds: Normal heart sounds.  Pulmonary:     Breath sounds: Normal breath sounds.  Skin:    General: Skin is warm.  Neurological:     General: No focal deficit present.     Mental Status: She is alert and oriented to person, place, and time.         Assessment And Plan:     1. Type 2 diabetes mellitus with stage 4 chronic kidney disease, with [place]-term current use of insulin (HCC) Comments: Chronic, I will check labs as listed below. Importance of dietary compliance was discussed with the patient.  - Hemoglobin A1c - BMP8+EGFR  2. 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  She will continue with current meds. Recent Renal note reviewed in full detail. Encouraged to increase her daily activity level.   3. Chronic diastolic HF (heart failure) ([place]) Comments: Chronic, encouraged to follow low sodium diet.   4. Chronic renal disease, stage IV (HCC) Comments: Chronic. Encouraged to avoid NSAIDs.   5.  Hyperparathyroidism, secondary renal ([place]) Comments: Recent renal labs reviewed in full detail.   6. Overweight with body mass index (BMI) of 29 to 29.9 in adult Comments: Her BMI is acceptable for her demographic. However, she is encouraged to aim for at least 150 minutes of exercise per week. Advised to include chair exercises.      Patient was given opportunity to ask questions. Patient verbalized understanding of the plan and was able to repeat key elements of the plan. All questions were answered to their satisfaction.  Maximino Greenland, MD   I, Maximino Greenland, MD, have reviewed all documentation for this visit. The documentation on 05/05/20 for the exam, diagnosis, procedures, and orders are all accurate and complete.  THE PATIENT IS ENCOURAGED TO PRACTICE SOCIAL DISTANCING DUE TO THE COVID-19  PANDEMIC.

## 2020-05-05 NOTE — Patient Instructions (Signed)
Diabetes Mellitus and Foot Care Foot care is an important part of your health, especially when you have diabetes. Diabetes may cause you to have problems because of poor blood flow (circulation) to your feet and legs, which can cause your skin to:  Become thinner and drier.  Break more easily.  Heal more slowly.  Peel and crack. You may also have nerve damage (neuropathy) in your legs and feet, causing decreased feeling in them. This means that you may not notice minor injuries to your feet that could lead to more serious problems. Noticing and addressing any potential problems early is the best way to prevent future foot problems. How to care for your feet Foot hygiene  Wash your feet daily with warm water and mild soap. Do not use hot water. Then, pat your feet and the areas between your toes until they are completely dry. Do not soak your feet as this can dry your skin.  Trim your toenails straight across. Do not dig under them or around the cuticle. File the edges of your nails with an emery board or nail file.  Apply a moisturizing lotion or petroleum jelly to the skin on your feet and to dry, brittle toenails. Use lotion that does not contain alcohol and is unscented. Do not apply lotion between your toes. Shoes and socks  Wear clean socks or stockings every day. Make sure they are not too tight. Do not wear knee-high stockings since they may decrease blood flow to your legs.  Wear shoes that fit properly and have enough cushioning. Always look in your shoes before you put them on to be sure there are no objects inside.  To break in new shoes, wear them for just a few hours a day. This prevents injuries on your feet. Wounds, scrapes, corns, and calluses  Check your feet daily for blisters, cuts, bruises, sores, and redness. If you cannot see the bottom of your feet, use a mirror or ask someone for help.  Do not cut corns or calluses or try to remove them with medicine.  If you  find a minor scrape, cut, or break in the skin on your feet, keep it and the skin around it clean and dry. You may clean these areas with mild soap and water. Do not clean the area with peroxide, alcohol, or iodine.  If you have a wound, scrape, corn, or callus on your foot, look at it several times a day to make sure it is healing and not infected. Check for: ? Redness, swelling, or pain. ? Fluid or blood. ? Warmth. ? Pus or a bad smell. General instructions  Do not cross your legs. This may decrease blood flow to your feet.  Do not use heating pads or hot water bottles on your feet. They may burn your skin. If you have lost feeling in your feet or legs, you may not know this is happening until it is too late.  Protect your feet from hot and cold by wearing shoes, such as at the beach or on hot pavement.  Schedule a complete foot exam at least once a year (annually) or more often if you have foot problems. If you have foot problems, report any cuts, sores, or bruises to your health care provider immediately. Contact a health care provider if:  You have a medical condition that increases your risk of infection and you have any cuts, sores, or bruises on your feet.  You have an injury that is not   healing.  You have redness on your legs or feet.  You feel burning or tingling in your legs or feet.  You have pain or cramps in your legs and feet.  Your legs or feet are numb.  Your feet always feel cold.  You have pain around a toenail. Get help right away if:  You have a wound, scrape, corn, or callus on your foot and: ? You have pain, swelling, or redness that gets worse. ? You have fluid or blood coming from the wound, scrape, corn, or callus. ? Your wound, scrape, corn, or callus feels warm to the touch. ? You have pus or a bad smell coming from the wound, scrape, corn, or callus. ? You have a fever. ? You have a red line going up your leg. Summary  Check your feet every day  for cuts, sores, red spots, swelling, and blisters.  Moisturize feet and legs daily.  Wear shoes that fit properly and have enough cushioning.  If you have foot problems, report any cuts, sores, or bruises to your health care provider immediately.  Schedule a complete foot exam at least once a year (annually) or more often if you have foot problems. This information is not intended to replace advice given to you by your health care provider. Make sure you discuss any questions you have with your health care provider. Document Revised: 04/17/2019 Document Reviewed: 08/26/2016 Elsevier Patient Education  2020 [employer]  

## 2020-05-06 LAB — BMP8+EGFR
BUN/Creatinine Ratio: 16 (ref 12–28)
BUN: 43 mg/dL — ABNORMAL HIGH (ref 8–27)
CO2: 23 mmol/L (ref 20–29)
Calcium: 8.4 mg/dL — ABNORMAL LOW (ref 8.7–10.3)
Chloride: 101 mmol/L (ref 96–106)
Creatinine, Ser: 2.73 mg/dL — ABNORMAL HIGH (ref 0.57–1.00)
GFR calc Af Amer: 18 mL/min/{1.73_m2} — ABNORMAL LOW (ref >59)
GFR calc non Af Amer: 16 mL/min/{1.73_m2} — ABNORMAL LOW (ref >59)
Glucose: 132 mg/dL — ABNORMAL HIGH (ref 65–99)
Potassium: 4 mmol/L (ref 3.5–5.2)
Sodium: 140 mmol/L (ref 134–144)

## 2020-05-06 LAB — HEMOGLOBIN A1C
Est. average glucose Bld gHb Est-mCnc: 166 mg/dL
Hgb A1c MFr Bld: 7.4 % — ABNORMAL HIGH (ref 4.8–5.6)

## 2020-05-22 ENCOUNTER — Other Ambulatory Visit: Payer: Self-pay | Admitting: Internal Medicine

## 2020-05-22 DIAGNOSIS — I10 Essential (primary) hypertension: Secondary | ICD-10-CM

## 2020-05-27 ENCOUNTER — Telehealth: Payer: Self-pay

## 2020-05-27 NOTE — Telephone Encounter (Signed)
The pt was notified that Dr.Sanders said that she could take meclizine or dramamine o-t-c to help her relax during her flight.

## 2020-06-09 ENCOUNTER — Other Ambulatory Visit: Payer: Self-pay

## 2020-06-09 MED ORDER — GLUCOSE BLOOD VI STRP: ORAL_STRIP | 12 refills | Status: DC

## 2020-06-30 DIAGNOSIS — Z6836 Body mass index (BMI) 36.0-36.9, adult: Secondary | ICD-10-CM | POA: Diagnosis not present

## 2020-06-30 DIAGNOSIS — E1122 Type 2 diabetes mellitus with diabetic chronic kidney disease: Secondary | ICD-10-CM | POA: Diagnosis not present

## 2020-06-30 DIAGNOSIS — D631 Anemia in chronic kidney disease: Secondary | ICD-10-CM | POA: Diagnosis not present

## 2020-06-30 DIAGNOSIS — I129 Hypertensive chronic kidney disease with stage 1 through stage 4 chronic kidney disease, or unspecified chronic kidney disease: Secondary | ICD-10-CM | POA: Diagnosis not present

## 2020-06-30 DIAGNOSIS — N2581 Secondary hyperparathyroidism of renal origin: Secondary | ICD-10-CM | POA: Diagnosis not present

## 2020-06-30 DIAGNOSIS — N184 Chronic kidney disease, stage 4 (severe): Secondary | ICD-10-CM | POA: Diagnosis not present

## 2020-06-30 DIAGNOSIS — E785 Hyperlipidemia, unspecified: Secondary | ICD-10-CM | POA: Diagnosis not present

## 2020-07-13 ENCOUNTER — Other Ambulatory Visit: Payer: Self-pay | Admitting: Internal Medicine

## 2020-07-13 ENCOUNTER — Other Ambulatory Visit: Payer: Self-pay | Admitting: Nurse Practitioner

## 2020-07-13 DIAGNOSIS — I10 Essential (primary) hypertension: Secondary | ICD-10-CM

## 2020-07-13 MED ORDER — AMLODIPINE BESYLATE 10 MG PO TABS
10.0000 mg | ORAL_TABLET | Freq: Every day | ORAL | 1 refills | Status: DC
Start: 2020-07-13 — End: 2020-08-14

## 2020-07-16 ENCOUNTER — Telehealth: Payer: Self-pay

## 2020-07-16 NOTE — Telephone Encounter (Signed)
I left the pt a message to try Delsym or [place] HBP to help with her cough.  If not better call the office for an appt.

## 2020-07-24 DIAGNOSIS — Z743 Need for continuous supervision: Secondary | ICD-10-CM | POA: Diagnosis not present

## 2020-07-24 DIAGNOSIS — E1165 Type 2 diabetes mellitus with hyperglycemia: Secondary | ICD-10-CM | POA: Diagnosis not present

## 2020-07-24 DIAGNOSIS — Z20828 Contact with and (suspected) exposure to other viral communicable diseases: Secondary | ICD-10-CM | POA: Diagnosis not present

## 2020-07-24 DIAGNOSIS — R6889 Other general symptoms and signs: Secondary | ICD-10-CM | POA: Diagnosis not present

## 2020-07-24 DIAGNOSIS — R0902 Hypoxemia: Secondary | ICD-10-CM | POA: Diagnosis not present

## 2020-07-25 ENCOUNTER — Encounter (HOSPITAL_COMMUNITY): Payer: Self-pay

## 2020-07-25 ENCOUNTER — Inpatient Hospital Stay (HOSPITAL_COMMUNITY)
Admission: EM | Admit: 2020-07-25 | Discharge: 2020-08-14 | DRG: 871 | Disposition: A | Discharge status: Home Health | Payer: Medicare HMO | Attending: Family Medicine | Admitting: Family Medicine

## 2020-07-25 ENCOUNTER — Emergency Department (HOSPITAL_COMMUNITY): Payer: Medicare HMO

## 2020-07-25 ENCOUNTER — Other Ambulatory Visit: Payer: Self-pay

## 2020-07-25 ENCOUNTER — Inpatient Hospital Stay (HOSPITAL_COMMUNITY): Payer: Medicare HMO

## 2020-07-25 DIAGNOSIS — J189 Pneumonia, unspecified organism: Secondary | ICD-10-CM | POA: Diagnosis not present

## 2020-07-25 DIAGNOSIS — J9601 Acute respiratory failure with hypoxia: Secondary | ICD-10-CM | POA: Diagnosis present

## 2020-07-25 DIAGNOSIS — I4891 Unspecified atrial fibrillation: Secondary | ICD-10-CM | POA: Diagnosis not present

## 2020-07-25 DIAGNOSIS — I272 Pulmonary hypertension, unspecified: Secondary | ICD-10-CM | POA: Diagnosis not present

## 2020-07-25 DIAGNOSIS — E1165 Type 2 diabetes mellitus with hyperglycemia: Secondary | ICD-10-CM | POA: Diagnosis present

## 2020-07-25 DIAGNOSIS — E1110 Type 2 diabetes mellitus with ketoacidosis without coma: Secondary | ICD-10-CM

## 2020-07-25 DIAGNOSIS — I132 Hypertensive heart and chronic kidney disease with heart failure and with stage 5 chronic kidney disease, or end stage renal disease: Secondary | ICD-10-CM | POA: Diagnosis present

## 2020-07-25 DIAGNOSIS — R6520 Severe sepsis without septic shock: Secondary | ICD-10-CM | POA: Diagnosis not present

## 2020-07-25 DIAGNOSIS — E872 Acidosis: Secondary | ICD-10-CM | POA: Diagnosis not present

## 2020-07-25 DIAGNOSIS — E1122 Type 2 diabetes mellitus with diabetic chronic kidney disease: Secondary | ICD-10-CM | POA: Diagnosis present

## 2020-07-25 DIAGNOSIS — Z794 Long term (current) use of insulin: Secondary | ICD-10-CM | POA: Diagnosis not present

## 2020-07-25 DIAGNOSIS — N184 Chronic kidney disease, stage 4 (severe): Secondary | ICD-10-CM

## 2020-07-25 DIAGNOSIS — D696 Thrombocytopenia, unspecified: Secondary | ICD-10-CM | POA: Diagnosis not present

## 2020-07-25 DIAGNOSIS — Z79899 Other long term (current) drug therapy: Secondary | ICD-10-CM

## 2020-07-25 DIAGNOSIS — J1282 Pneumonia due to coronavirus disease 2019: Secondary | ICD-10-CM | POA: Diagnosis not present

## 2020-07-25 DIAGNOSIS — N179 Acute kidney failure, unspecified: Secondary | ICD-10-CM

## 2020-07-25 DIAGNOSIS — A4189 Other specified sepsis: Principal | ICD-10-CM | POA: Diagnosis present

## 2020-07-25 DIAGNOSIS — A419 Sepsis, unspecified organism: Secondary | ICD-10-CM | POA: Diagnosis not present

## 2020-07-25 DIAGNOSIS — G4733 Obstructive sleep apnea (adult) (pediatric): Secondary | ICD-10-CM | POA: Diagnosis present

## 2020-07-25 DIAGNOSIS — Z7982 Long term (current) use of aspirin: Secondary | ICD-10-CM | POA: Diagnosis not present

## 2020-07-25 DIAGNOSIS — I129 Hypertensive chronic kidney disease with stage 1 through stage 4 chronic kidney disease, or unspecified chronic kidney disease: Secondary | ICD-10-CM | POA: Diagnosis not present

## 2020-07-25 DIAGNOSIS — E785 Hyperlipidemia, unspecified: Secondary | ICD-10-CM | POA: Diagnosis present

## 2020-07-25 DIAGNOSIS — R410 Disorientation, unspecified: Secondary | ICD-10-CM

## 2020-07-25 DIAGNOSIS — I10 Essential (primary) hypertension: Secondary | ICD-10-CM | POA: Diagnosis present

## 2020-07-25 DIAGNOSIS — D631 Anemia in chronic kidney disease: Secondary | ICD-10-CM | POA: Diagnosis present

## 2020-07-25 DIAGNOSIS — U071 COVID-19: Secondary | ICD-10-CM | POA: Diagnosis present

## 2020-07-25 DIAGNOSIS — Z833 Family history of diabetes mellitus: Secondary | ICD-10-CM

## 2020-07-25 DIAGNOSIS — E869 Volume depletion, unspecified: Secondary | ICD-10-CM | POA: Diagnosis not present

## 2020-07-25 DIAGNOSIS — M069 Rheumatoid arthritis, unspecified: Secondary | ICD-10-CM | POA: Diagnosis present

## 2020-07-25 DIAGNOSIS — E1129 Type 2 diabetes mellitus with other diabetic kidney complication: Secondary | ICD-10-CM | POA: Diagnosis not present

## 2020-07-25 DIAGNOSIS — N186 End stage renal disease: Secondary | ICD-10-CM

## 2020-07-25 DIAGNOSIS — R0602 Shortness of breath: Secondary | ICD-10-CM | POA: Diagnosis not present

## 2020-07-25 DIAGNOSIS — R059 Cough, unspecified: Secondary | ICD-10-CM

## 2020-07-25 DIAGNOSIS — E7800 Pure hypercholesterolemia, unspecified: Secondary | ICD-10-CM | POA: Diagnosis not present

## 2020-07-25 DIAGNOSIS — D6489 Other specified anemias: Secondary | ICD-10-CM | POA: Diagnosis present

## 2020-07-25 DIAGNOSIS — G928 Other toxic encephalopathy: Secondary | ICD-10-CM | POA: Diagnosis present

## 2020-07-25 DIAGNOSIS — Z823 Family history of stroke: Secondary | ICD-10-CM

## 2020-07-25 DIAGNOSIS — Z4901 Encounter for fitting and adjustment of extracorporeal dialysis catheter: Secondary | ICD-10-CM | POA: Diagnosis not present

## 2020-07-25 DIAGNOSIS — I5032 Chronic diastolic (congestive) heart failure: Secondary | ICD-10-CM | POA: Diagnosis not present

## 2020-07-25 DIAGNOSIS — Z880 Allergy status to penicillin: Secondary | ICD-10-CM

## 2020-07-25 DIAGNOSIS — Z8249 Family history of ischemic heart disease and other diseases of the circulatory system: Secondary | ICD-10-CM

## 2020-07-25 DIAGNOSIS — Z8673 Personal history of transient ischemic attack (TIA), and cerebral infarction without residual deficits: Secondary | ICD-10-CM

## 2020-07-25 DIAGNOSIS — T380X5A Adverse effect of glucocorticoids and synthetic analogues, initial encounter: Secondary | ICD-10-CM | POA: Diagnosis present

## 2020-07-25 DIAGNOSIS — E1121 Type 2 diabetes mellitus with diabetic nephropathy: Secondary | ICD-10-CM | POA: Diagnosis not present

## 2020-07-25 LAB — GLUCOSE, CAPILLARY
Glucose-Capillary: 210 mg/dL — ABNORMAL HIGH (ref 70–99)
Glucose-Capillary: 210 mg/dL — ABNORMAL HIGH (ref 70–99)

## 2020-07-25 LAB — CBC WITH DIFFERENTIAL/PLATELET
Abs Immature Granulocytes: 0.09 10*3/uL — ABNORMAL HIGH (ref 0.00–0.07)
Abs Immature Granulocytes: 0.09 10*3/uL — ABNORMAL HIGH (ref 0.00–0.07)
Basophils Absolute: 0 10*3/uL (ref 0.0–0.1)
Basophils Absolute: 0 10*3/uL (ref 0.0–0.1)
Basophils Relative: 0 %
Basophils Relative: 0 %
Eosinophils Absolute: 0 10*3/uL (ref 0.0–0.5)
Eosinophils Absolute: 0 10*3/uL (ref 0.0–0.5)
Eosinophils Relative: 0 %
Eosinophils Relative: 0 %
HCT: 27.2 % — ABNORMAL LOW (ref 36.0–46.0)
HCT: 23.3 % — ABNORMAL LOW (ref 36.0–46.0)
Hemoglobin: 8.6 g/dL — ABNORMAL LOW (ref 12.0–15.0)
Hemoglobin: 7.4 g/dL — ABNORMAL LOW (ref 12.0–15.0)
Immature Granulocytes: 1 %
Immature Granulocytes: 1 %
Lymphocytes Relative: 7 %
Lymphocytes Relative: 11 %
Lymphs Abs: 0.7 10*3/uL (ref 0.7–4.0)
Lymphs Abs: 1.2 10*3/uL (ref 0.7–4.0)
MCH: 21.9 pg — ABNORMAL LOW (ref 26.0–34.0)
MCH: 22 pg — ABNORMAL LOW (ref 26.0–34.0)
MCHC: 31.6 g/dL (ref 30.0–36.0)
MCHC: 31.8 g/dL (ref 30.0–36.0)
MCV: 69.2 fL — ABNORMAL LOW (ref 80.0–100.0)
MCV: 69.3 fL — ABNORMAL LOW (ref 80.0–100.0)
Monocytes Absolute: 0.4 10*3/uL (ref 0.1–1.0)
Monocytes Absolute: 0.5 10*3/uL (ref 0.1–1.0)
Monocytes Relative: 4 %
Monocytes Relative: 4 %
Neutro Abs: 9.5 10*3/uL — ABNORMAL HIGH (ref 1.7–7.7)
Neutro Abs: 8.9 10*3/uL — ABNORMAL HIGH (ref 1.7–7.7)
Neutrophils Relative %: 88 %
Neutrophils Relative %: 84 %
Platelets: 241 10*3/uL (ref 150–400)
Platelets: 267 10*3/uL (ref 150–400)
RBC: 3.93 MIL/uL (ref 3.87–5.11)
RBC: 3.36 MIL/uL — ABNORMAL LOW (ref 3.87–5.11)
RDW: 15 % (ref 11.5–15.5)
RDW: 15 % (ref 11.5–15.5)
WBC: 10.8 10*3/uL — ABNORMAL HIGH (ref 4.0–10.5)
WBC: 10.6 10*3/uL — ABNORMAL HIGH (ref 4.0–10.5)
nRBC: 0 % (ref 0.0–0.2)
nRBC: 0 % (ref 0.0–0.2)

## 2020-07-25 LAB — URINALYSIS, ROUTINE W REFLEX MICROSCOPIC
Bilirubin Urine: NEGATIVE
Glucose, UA: NEGATIVE mg/dL
Ketones, ur: NEGATIVE mg/dL
Leukocytes,Ua: NEGATIVE
Nitrite: NEGATIVE
Protein, ur: 100 mg/dL — AB
Specific Gravity, Urine: 1.01 (ref 1.005–1.030)
pH: 5 (ref 5.0–8.0)

## 2020-07-25 LAB — BETA-HYDROXYBUTYRIC ACID
Beta-Hydroxybutyric Acid: 0.24 mmol/L (ref 0.05–0.27)
Beta-Hydroxybutyric Acid: 1.42 mmol/L — ABNORMAL HIGH (ref 0.05–0.27)
Beta-Hydroxybutyric Acid: 0.74 mmol/L — ABNORMAL HIGH (ref 0.05–0.27)

## 2020-07-25 LAB — BLOOD GAS, VENOUS
Acid-base deficit: 4.4 mmol/L — ABNORMAL HIGH (ref 0.0–2.0)
Bicarbonate: 20.5 mmol/L (ref 20.0–28.0)
O2 Saturation: 77.9 %
Patient temperature: 98.6
pCO2, Ven: 39.3 mmHg — ABNORMAL LOW (ref 44.0–60.0)
pH, Ven: 7.336 (ref 7.250–7.430)
pO2, Ven: 47.4 mmHg — ABNORMAL HIGH (ref 32.0–45.0)

## 2020-07-25 LAB — BRAIN NATRIURETIC PEPTIDE: B Natriuretic Peptide: 259.8 pg/mL — ABNORMAL HIGH (ref 0.0–100.0)

## 2020-07-25 LAB — D-DIMER, QUANTITATIVE: D-Dimer, Quant: 1.36 ug/mL-FEU — ABNORMAL HIGH (ref 0.00–0.50)

## 2020-07-25 LAB — COMPREHENSIVE METABOLIC PANEL
ALT: 16 U/L (ref 0–44)
ALT: 15 U/L (ref 0–44)
AST: 34 U/L (ref 15–41)
AST: 35 U/L (ref 15–41)
Albumin: 2.8 g/dL — ABNORMAL LOW (ref 3.5–5.0)
Albumin: 2.7 g/dL — ABNORMAL LOW (ref 3.5–5.0)
Alkaline Phosphatase: 57 U/L (ref 38–126)
Alkaline Phosphatase: 53 U/L (ref 38–126)
Anion gap: 18 — ABNORMAL HIGH (ref 5–15)
Anion gap: 16 — ABNORMAL HIGH (ref 5–15)
BUN: 101 mg/dL — ABNORMAL HIGH (ref 8–23)
BUN: 93 mg/dL — ABNORMAL HIGH (ref 8–23)
CO2: 18 mmol/L — ABNORMAL LOW (ref 22–32)
CO2: 18 mmol/L — ABNORMAL LOW (ref 22–32)
Calcium: 7.9 mg/dL — ABNORMAL LOW (ref 8.9–10.3)
Calcium: 7.9 mg/dL — ABNORMAL LOW (ref 8.9–10.3)
Chloride: 99 mmol/L (ref 98–111)
Chloride: 101 mmol/L (ref 98–111)
Creatinine, Ser: 6.87 mg/dL — ABNORMAL HIGH (ref 0.44–1.00)
Creatinine, Ser: 6.04 mg/dL — ABNORMAL HIGH (ref 0.44–1.00)
GFR, Estimated: 6 mL/min — ABNORMAL LOW (ref >60)
GFR, Estimated: 6 mL/min — ABNORMAL LOW (ref >60)
Glucose, Bld: 219 mg/dL — ABNORMAL HIGH (ref 70–99)
Glucose, Bld: 124 mg/dL — ABNORMAL HIGH (ref 70–99)
Potassium: 3.6 mmol/L (ref 3.5–5.1)
Potassium: 3.5 mmol/L (ref 3.5–5.1)
Sodium: 135 mmol/L (ref 135–145)
Sodium: 135 mmol/L (ref 135–145)
Total Bilirubin: 0.5 mg/dL (ref 0.3–1.2)
Total Bilirubin: 0.3 mg/dL (ref 0.3–1.2)
Total Protein: 7.9 g/dL (ref 6.5–8.1)
Total Protein: 7.7 g/dL (ref 6.5–8.1)

## 2020-07-25 LAB — CBG MONITORING, ED
Glucose-Capillary: 207 mg/dL — ABNORMAL HIGH (ref 70–99)
Glucose-Capillary: 186 mg/dL — ABNORMAL HIGH (ref 70–99)
Glucose-Capillary: 170 mg/dL — ABNORMAL HIGH (ref 70–99)
Glucose-Capillary: 145 mg/dL — ABNORMAL HIGH (ref 70–99)
Glucose-Capillary: 105 mg/dL — ABNORMAL HIGH (ref 70–99)
Glucose-Capillary: 123 mg/dL — ABNORMAL HIGH (ref 70–99)

## 2020-07-25 LAB — CREATININE, URINE, RANDOM: Creatinine, Urine: 100.23 mg/dL

## 2020-07-25 LAB — RESP PANEL BY RT-PCR (FLU A&B, COVID) ARPGX2
Influenza A by PCR: NEGATIVE
Influenza B by PCR: NEGATIVE
SARS Coronavirus 2 by RT PCR: POSITIVE — AB

## 2020-07-25 LAB — LACTATE DEHYDROGENASE: LDH: 295 U/L — ABNORMAL HIGH (ref 98–192)

## 2020-07-25 LAB — LACTIC ACID, PLASMA: Lactic Acid, Venous: 1.3 mmol/L (ref 0.5–1.9)

## 2020-07-25 LAB — FIBRINOGEN: Fibrinogen: 744 mg/dL — ABNORMAL HIGH (ref 210–475)

## 2020-07-25 LAB — PROCALCITONIN: Procalcitonin: 2.35 ng/mL

## 2020-07-25 LAB — MRSA PCR SCREENING: MRSA by PCR: NEGATIVE

## 2020-07-25 LAB — TRIGLYCERIDES: Triglycerides: 185 mg/dL — ABNORMAL HIGH (ref <150)

## 2020-07-25 LAB — APTT: aPTT: 32 seconds (ref 24–36)

## 2020-07-25 LAB — FERRITIN: Ferritin: 2497 ng/mL — ABNORMAL HIGH (ref 11–307)

## 2020-07-25 LAB — PROTIME-INR
INR: 1.2 (ref 0.8–1.2)
Prothrombin Time: 15.2 seconds (ref 11.4–15.2)

## 2020-07-25 LAB — TSH: TSH: 0.438 u[IU]/mL (ref 0.350–4.500)

## 2020-07-25 LAB — C-REACTIVE PROTEIN: CRP: 25.8 mg/dL — ABNORMAL HIGH (ref <1.0)

## 2020-07-25 LAB — MAGNESIUM: Magnesium: 2 mg/dL (ref 1.7–2.4)

## 2020-07-25 LAB — SODIUM, URINE, RANDOM: Sodium, Ur: 38 mmol/L

## 2020-07-25 LAB — HEPARIN LEVEL (UNFRACTIONATED): Heparin Unfractionated: 0.69 IU/mL (ref 0.30–0.70)

## 2020-07-25 MED ORDER — SODIUM CHLORIDE 0.45 % IV SOLN: INTRAVENOUS | Status: AC

## 2020-07-25 MED ORDER — INSULIN ASPART 100 UNIT/ML ~~LOC~~ SOLN
0.0000 [IU] | Freq: Three times a day (TID) | SUBCUTANEOUS | Status: DC
  Administered 2020-07-25: 3 [IU] via SUBCUTANEOUS
  Administered 2020-07-25: 1 [IU] via SUBCUTANEOUS
  Administered 2020-07-26: 5 [IU] via SUBCUTANEOUS
  Administered 2020-07-26: 3 [IU] via SUBCUTANEOUS
  Administered 2020-07-26 – 2020-07-27 (×2): 2 [IU] via SUBCUTANEOUS
  Administered 2020-07-27: 3 [IU] via SUBCUTANEOUS
  Administered 2020-07-27: 2 [IU] via SUBCUTANEOUS
  Administered 2020-07-28 (×3): 3 [IU] via SUBCUTANEOUS
  Administered 2020-07-29: 5 [IU] via SUBCUTANEOUS
  Filled 2020-07-25: qty 0.09

## 2020-07-25 MED ORDER — AMIODARONE LOAD VIA INFUSION
150.0000 mg | Freq: Once | INTRAVENOUS | Status: DC
  Filled 2020-07-25: qty 83.34

## 2020-07-25 MED ORDER — ACETAMINOPHEN 325 MG PO TABS
650.0000 mg | ORAL_TABLET | Freq: Four times a day (QID) | ORAL | Status: DC | PRN
  Administered 2020-08-11: 650 mg via ORAL
  Filled 2020-07-25 (×2): qty 2

## 2020-07-25 MED ORDER — LINAGLIPTIN 5 MG PO TABS
5.0000 mg | ORAL_TABLET | Freq: Every day | ORAL | Status: DC
  Administered 2020-07-25 – 2020-08-09 (×15): 5 mg via ORAL
  Filled 2020-07-25 (×16): qty 1

## 2020-07-25 MED ORDER — IPRATROPIUM-ALBUTEROL 20-100 MCG/ACT IN AERS
1.0000 | INHALATION_SPRAY | Freq: Four times a day (QID) | RESPIRATORY_TRACT | Status: DC
  Filled 2020-07-25 (×2): qty 4

## 2020-07-25 MED ORDER — DEXTROSE IN LACTATED RINGERS 5 % IV SOLN: INTRAVENOUS | Status: DC

## 2020-07-25 MED ORDER — GUAIFENESIN-DM 100-10 MG/5ML PO SYRP
10.0000 mL | ORAL_SOLUTION | ORAL | Status: DC | PRN
  Administered 2020-07-28 – 2020-07-29 (×2): 10 mL via ORAL
  Filled 2020-07-25 (×2): qty 10

## 2020-07-25 MED ORDER — LEVOFLOXACIN IN D5W 750 MG/150ML IV SOLN
750.0000 mg | Freq: Once | INTRAVENOUS | Status: AC
  Administered 2020-07-25: 750 mg via INTRAVENOUS
  Filled 2020-07-25: qty 150

## 2020-07-25 MED ORDER — POTASSIUM CHLORIDE 10 MEQ/100ML IV SOLN
10.0000 meq | INTRAVENOUS | Status: AC
Start: 2020-07-25 — End: 2020-07-25
  Administered 2020-07-25 (×2): 10 meq via INTRAVENOUS
  Filled 2020-07-25 (×2): qty 100

## 2020-07-25 MED ORDER — SODIUM CHLORIDE 0.9 % IV BOLUS
1000.0000 mL | Freq: Once | INTRAVENOUS | Status: AC
  Administered 2020-07-25: 1000 mL via INTRAVENOUS

## 2020-07-25 MED ORDER — CHLORHEXIDINE GLUCONATE 0.12 % MT SOLN
15.0000 mL | Freq: Two times a day (BID) | OROMUCOSAL | Status: DC
  Administered 2020-07-25 – 2020-08-14 (×35): 15 mL via OROMUCOSAL
  Filled 2020-07-25 (×36): qty 15

## 2020-07-25 MED ORDER — DILTIAZEM LOAD VIA INFUSION
10.0000 mg | Freq: Once | INTRAVENOUS | Status: AC
  Administered 2020-07-25: 10 mg via INTRAVENOUS
  Filled 2020-07-25: qty 10

## 2020-07-25 MED ORDER — ASPIRIN 325 MG PO TABS
325.0000 mg | ORAL_TABLET | Freq: Every day | ORAL | Status: DC
  Filled 2020-07-25: qty 1

## 2020-07-25 MED ORDER — INSULIN DETEMIR 100 UNIT/ML ~~LOC~~ SOLN
0.0750 [IU]/kg | Freq: Two times a day (BID) | SUBCUTANEOUS | Status: DC
  Administered 2020-07-25 – 2020-07-29 (×9): 5 [IU] via SUBCUTANEOUS
  Filled 2020-07-25 (×10): qty 0.05

## 2020-07-25 MED ORDER — LACTATED RINGERS IV SOLN: INTRAVENOUS | Status: DC

## 2020-07-25 MED ORDER — AMIODARONE HCL IN DEXTROSE 360-4.14 MG/200ML-% IV SOLN
30.0000 mg/h | INTRAVENOUS | Status: DC
  Administered 2020-07-27 – 2020-08-03 (×12): 30 mg/h via INTRAVENOUS
  Filled 2020-07-25 (×19): qty 200

## 2020-07-25 MED ORDER — SODIUM CHLORIDE 0.9% FLUSH
3.0000 mL | Freq: Two times a day (BID) | INTRAVENOUS | Status: DC
  Administered 2020-07-25 – 2020-08-13 (×23): 3 mL via INTRAVENOUS

## 2020-07-25 MED ORDER — INSULIN GLARGINE 100 UNIT/ML ~~LOC~~ SOLN: 10.0000 [IU] | Freq: Every day | SUBCUTANEOUS | Status: DC

## 2020-07-25 MED ORDER — DILTIAZEM HCL-DEXTROSE 125-5 MG/125ML-% IV SOLN (PREMIX)
5.0000 mg/h | INTRAVENOUS | Status: DC
  Administered 2020-07-25: 15 mg/h via INTRAVENOUS
  Administered 2020-07-26: 5 mg/h via INTRAVENOUS
  Filled 2020-07-25 (×3): qty 125

## 2020-07-25 MED ORDER — CHLORHEXIDINE GLUCONATE CLOTH 2 % EX PADS
6.0000 | MEDICATED_PAD | Freq: Every day | CUTANEOUS | Status: DC
  Administered 2020-07-25 – 2020-08-01 (×8): 6 via TOPICAL

## 2020-07-25 MED ORDER — HEPARIN (PORCINE) 25000 UT/250ML-% IV SOLN
750.0000 [IU]/h | INTRAVENOUS | Status: DC
  Administered 2020-07-25: 900 [IU]/h via INTRAVENOUS
  Administered 2020-07-26: 750 [IU]/h via INTRAVENOUS
  Administered 2020-07-28: 650 [IU]/h via INTRAVENOUS
  Filled 2020-07-25 (×5): qty 250

## 2020-07-25 MED ORDER — AMLODIPINE BESYLATE 5 MG PO TABS: 10.0000 mg | ORAL_TABLET | Freq: Every day | ORAL | Status: DC

## 2020-07-25 MED ORDER — SODIUM CHLORIDE 0.9 % IV SOLN
1.0000 g | INTRAVENOUS | Status: AC
  Administered 2020-07-26 – 2020-07-29 (×4): 1 g via INTRAVENOUS
  Filled 2020-07-25 (×4): qty 10

## 2020-07-25 MED ORDER — METHYLPREDNISOLONE SODIUM SUCC 40 MG IJ SOLR
0.5000 mg/kg | Freq: Two times a day (BID) | INTRAMUSCULAR | Status: DC
  Administered 2020-07-25 – 2020-07-26 (×4): 34.8 mg via INTRAVENOUS
  Filled 2020-07-25 (×4): qty 1

## 2020-07-25 MED ORDER — SODIUM CHLORIDE 0.9 % IV SOLN
100.0000 mg | Freq: Two times a day (BID) | INTRAVENOUS | Status: AC
  Administered 2020-07-26 – 2020-07-27 (×4): 100 mg via INTRAVENOUS
  Filled 2020-07-25 (×4): qty 100

## 2020-07-25 MED ORDER — HEPARIN SODIUM (PORCINE) 5000 UNIT/ML IJ SOLN
5000.0000 [IU] | Freq: Three times a day (TID) | INTRAMUSCULAR | Status: DC
  Administered 2020-07-25: 5000 [IU] via SUBCUTANEOUS
  Filled 2020-07-25: qty 1

## 2020-07-25 MED ORDER — AMIODARONE HCL IN DEXTROSE 360-4.14 MG/200ML-% IV SOLN
60.0000 mg/h | INTRAVENOUS | Status: DC
  Filled 2020-07-25: qty 200

## 2020-07-25 MED ORDER — IPRATROPIUM-ALBUTEROL 20-100 MCG/ACT IN AERS
1.0000 | INHALATION_SPRAY | Freq: Two times a day (BID) | RESPIRATORY_TRACT | Status: DC
  Administered 2020-07-26 – 2020-07-31 (×9): 1 via RESPIRATORY_TRACT
  Filled 2020-07-25 (×2): qty 4

## 2020-07-25 MED ORDER — LACTATED RINGERS IV BOLUS
20.0000 mL/kg | Freq: Once | INTRAVENOUS | Status: AC
  Administered 2020-07-25: 1386 mL via INTRAVENOUS

## 2020-07-25 MED ORDER — ORAL CARE MOUTH RINSE
15.0000 mL | Freq: Two times a day (BID) | OROMUCOSAL | Status: DC
  Administered 2020-07-25 – 2020-08-12 (×24): 15 mL via OROMUCOSAL

## 2020-07-25 MED ORDER — ATORVASTATIN CALCIUM 10 MG PO TABS
20.0000 mg | ORAL_TABLET | Freq: Every day | ORAL | Status: DC
Start: 2020-07-25 — End: 2020-08-14
  Administered 2020-07-25 – 2020-08-13 (×19): 20 mg via ORAL
  Filled 2020-07-25 (×19): qty 2

## 2020-07-25 MED ORDER — INSULIN REGULAR(HUMAN) IN NACL 100-0.9 UT/100ML-% IV SOLN
INTRAVENOUS | Status: DC
  Administered 2020-07-25: 3.4 [IU]/h via INTRAVENOUS
  Filled 2020-07-25: qty 100

## 2020-07-25 MED ORDER — CLONIDINE HCL 0.1 MG PO TABS: 0.1000 mg | ORAL_TABLET | Freq: Two times a day (BID) | ORAL | Status: DC

## 2020-07-25 MED ORDER — ORAL CARE MOUTH RINSE
15.0000 mL | Freq: Two times a day (BID) | OROMUCOSAL | Status: DC
  Administered 2020-07-25 – 2020-08-03 (×15): 15 mL via OROMUCOSAL

## 2020-07-25 MED ORDER — PREDNISONE 20 MG PO TABS: 50.0000 mg | ORAL_TABLET | Freq: Every day | ORAL | Status: DC

## 2020-07-25 MED ORDER — DEXTROSE 50 % IV SOLN
0.0000 mL | INTRAVENOUS | Status: DC | PRN
  Administered 2020-07-30: 35 mL via INTRAVENOUS
  Administered 2020-08-05 – 2020-08-06 (×2): 50 mL via INTRAVENOUS
  Filled 2020-07-25 (×4): qty 50

## 2020-07-25 NOTE — ED Notes (Signed)
Report called to [facility]

## 2020-07-25 NOTE — Progress Notes (Signed)
A consult was received from an ED physician for levaquin per pharmacy dosing.  The patient's profile has been reviewed for ht/wt/allergies/indication/available labs.   A one time order has been placed for levaquin 750mg .    Further antibiotics/pharmacy consults should be ordered by admitting physician if indicated.                       Thank you, Dolly Rias RPh 07/25/2020, 4:59 AM

## 2020-07-25 NOTE — ED Notes (Signed)
Pt given lunch tray.

## 2020-07-25 NOTE — Progress Notes (Signed)
Pt states she has a cpap machine at home but hasn't wore it in a [place] time.  Pt declined cpap tonight stating she is comfortable with oxygen (currently at 6lpm), RN aware.

## 2020-07-25 NOTE — Consult Note (Signed)
Cardiology Consultation:   Patient ID: [name] [name] MRN: [id number]; DOB: 08-Mar-1936  Admit date: 07/25/2020 Date of Consult: 07/25/2020  Primary Care Provider: [name], MD [facility] Cardiologist: new [facility] Electrophysiologist:  None    Patient Profile:   [name] [name] is a 84 y.o. female with a hx of  rheumatoid arthritis, CKD IV, HTN, HL, OSA, TIA, chronic diastolic HF presented with cough, SOB, fatigue admitted with COVID pneumonia who is being seen today for the evaluation of afib at the request of Dr Neysa Bonito.  History of Present Illness:   COVID + patient, consultation completed remotely to limit unneccesary patient/provider exposure given the focused clinical nature of the consultation.    Ms. [name] [name] 84 yo female history of rheumatoid arthritis, CKD IV, HTN, HL, OSA, TIA, chronic diastolic HF presented with cough, SOB, fatigue. Diagnosed with severe COVID pneumonia being managed by primary team. During admission developed afib with RVR, a new diagnosis for the patient. Cardiology consulted to help manage. Admission also complicated by significant AKI on CKD admission Cr up to 6.9   WBC 10.8 Plt 241K 3.6 Cr 6.87 Lacitc acid 1.3 BNP 259 Procalcitonin 2.35 TSH 0.438  COVID + CXR multifocal opacities EKG sinus tach, followed by afib with RVR    Past Medical History:  Diagnosis Date  . Arthritis    RA  . CHF (congestive heart failure) ([place])   . CKD (chronic kidney disease) stage 3, GFR 30-59 ml/min (HCC)   . Diabetes mellitus without complication (HCC)    insulin dependent  . Hyperlipidemia   . Hypertension   . Shortness of breath dyspnea   . Sleep apnea    cpap  . TIA (transient ischemic attack) 2015    Past Surgical History:  Procedure Laterality Date  . ABDOMINAL HYSTERECTOMY  1974     Inpatient Medications: Scheduled Meds: . atorvastatin  20 mg Oral q1800  . insulin aspart  0-9 Units Subcutaneous TID WC  . insulin  detemir  0.075 Units/kg Subcutaneous BID  . Ipratropium-Albuterol  1 puff Inhalation Q6H  . linagliptin  5 mg Oral Daily  . methylPREDNISolone (SOLU-MEDROL) injection  0.5 mg/kg Intravenous Q12H   Followed by  . [START ON 07/28/2020] predniSONE  50 mg Oral Daily  . sodium chloride flush  3 mL Intravenous Q12H   Continuous Infusions: . [START ON 07/26/2020] cefTRIAXone (ROCEPHIN)  IV    . diltiazem (CARDIZEM) infusion 15 mg/hr (07/25/20 1129)  . [START ON 07/26/2020] doxycycline (VIBRAMYCIN) IV    . heparin 900 Units/hr (07/25/20 1140)   PRN Meds: acetaminophen, dextrose, guaiFENesin-dextromethorphan  Allergies:    Allergies  Allergen Reactions  . Penicillins Swelling and Rash    Social History:   Social History   Socioeconomic History  . Marital status: Married    Spouse name: Not on file  . Number of children: 6  . Years of education: 12th  . Highest education level: Not on file  Occupational History  . Occupation: retired    Fish farm manager: RETIRED  Tobacco Use  . Smoking status: Never Smoker  . Smokeless tobacco: Never Used  Vaping Use  . Vaping Use: Never used  Substance and Sexual Activity  . Alcohol use: No    Alcohol/week: 0.0 standard drinks  . Drug use: No  . Sexual activity: Not Currently  Other Topics Concern  . Not on file  Social History Narrative   Patient lives with her husband    Patient is right handed   Patient  drinks tea   Social Determinants of Health   Financial Resource Strain: Low Risk   . Difficulty of Paying Living Expenses: Not hard at all  Food Insecurity: No Food Insecurity  . Worried About Charity fundraiser in the Last Year: Never true  . Ran Out of Food in the Last Year: Never true  Transportation Needs: No Transportation Needs  . Lack of Transportation (Medical): No  . Lack of Transportation (Non-Medical): No  Physical Activity: Inactive  . Days of Exercise per Week: 0 days  . Minutes of Exercise per Session: 0 min  Stress:  No Stress Concern Present  . Feeling of Stress : Not at all  Social Connections: Not on file  Intimate Partner Violence: Not on file    Family History:    Family History  Problem Relation Age of Onset  . Diabetes type II Mother   . Hypertension Mother   . Diabetes type II Father   . Hypertension Other   . Hyperlipidemia Other   . Stroke Other      ROS:  Please see the history of present illness.  All other ROS reviewed and negative.     Physical Exam/Data:   Vitals:   07/25/20 1200 07/25/20 1230 07/25/20 1300 07/25/20 1330  BP: (!) 135/38 (!) 141/79 (!) 149/81 (!) 146/102  Pulse: (!) 139 (!) 143 (!) 129 (!) 138  Resp: 19 20 (!) 22 20  Temp:      TempSrc:      SpO2: (!) 88% 92% 92% 92%  Weight:      Height:        Intake/Output Summary (Last 24 hours) at 07/25/2020 1352 Last data filed at 07/25/2020 0944 Gross per 24 hour  Intake 1850 ml  Output --  Net 1850 ml   Last 3 Weights 07/25/2020 07/25/2020 05/05/2020  Weight (lbs) 153 lb 9.6 oz 152 lb 12.5 oz 152 lb 12.8 oz  Weight (kg) 69.673 kg 69.3 kg 69.31 kg     Body mass index is 30 kg/m.  Physical exam deferred to prevent unneccesary patient/provider contact in COVID + patient  Laboratory Data:  High Sensitivity Troponin:  No results for input(s): TROPONINIHS in the last 720 hours.   Chemistry Recent Labs  Lab 07/25/20 0300 07/25/20 0840  NA 135 135  K 3.6 3.5  CL 99 101  CO2 18* 18*  GLUCOSE 219* 124*  BUN 101* 93*  CREATININE 6.87* 6.04*  CALCIUM 7.9* 7.9*  GFRNONAA 6* 6*  ANIONGAP 18* 16*    Recent Labs  Lab 07/25/20 0300 07/25/20 0840  PROT 7.9 7.7  ALBUMIN 2.8* 2.7*  AST 34 35  ALT 16 15  ALKPHOS 57 53  BILITOT 0.5 0.3   Hematology Recent Labs  Lab 07/25/20 0300 07/25/20 0840  WBC 10.8* 10.6*  RBC 3.93 3.36*  HGB 8.6* 7.4*  HCT 27.2* 23.3*  MCV 69.2* 69.3*  MCH 21.9* 22.0*  MCHC 31.6 31.8  RDW 15.0 15.0  PLT 241 267   BNP Recent Labs  Lab 07/25/20 0619  BNP 259.8*     DDimer  Recent Labs  Lab 07/25/20 0840  DDIMER 1.36*     Radiology/Studies:  DG Chest 2 View  Result Date: 07/25/2020 CLINICAL DATA:  COVID-19 EXAM: CHEST - 2 VIEW COMPARISON:  05/25/2015 FINDINGS: Multifocal peripheral predominant airspace opacity. No pleural effusion or pneumothorax. IMPRESSION: Multifocal peripheral predominant airspace opacity, compatible with multifocal pneumonia. Electronically Signed   By: Cletus Gash.D.  On: 07/25/2020 02:48     Assessment and Plan:   1. COVID pneumonia - severe COVID 19 pneumonia, management per primary team  2. Afib - new diagnosis in setting of severe systemic illness with COVID 19 pneumonia. Unclear if isolated in setting of systemic illness or more [place] term issue - maxed on dilt gtt, bp's tolerating but remains significantly tachycardic. - will start amiodarone, would anticipate short course, as severe systemic drive for afib/tachycardia resolves likely can come off - can continue dilt gtt for now - CHADS2Vasc score is 7 (HTN, age x 2, DM, TIA x2, gender), primary team has started heparin. Can convert to [place] later in admission. [place] term outpatient follow up can determine if longer use is indicated, would consider outpatient monitor after systemic illness has resolved - can obtain echo once heart rates better controlled.   3. Anemia - monitor closely, if further drop in Hgb may have to stop heparin  For questions or updates, please contact [place] Please consult [email or web address] for contact info under    Signed, Carlyle Dolly, MD  07/25/2020 1:52 PM

## 2020-07-25 NOTE — Progress Notes (Signed)
Following for Code Sepsis  

## 2020-07-25 NOTE — ED Provider Notes (Signed)
[facility] DEPT Provider Note  CSN: [id number] Arrival date & time: 07/25/20 0000  Chief Complaint(s) Fatigue ED Triage Notes Kiser, Linda Hedges, RN (Registered Nurse) . [facility] . [facility] Date of Service: 07/25/2020 12:08 AM . . Signed   Patient BIB [facility] coming from home. Patient does not have complaints but daughter said she has not eaten well the past 5 days. Patient has had a productive cough for a week.   EMS  20G right AC CBG 275 BP 164/86 HR 100 RR 18 O2 85% Room air EMS put her on 3L and it came up to 92%  Patients daughter reports patient wears a CPAP at night.  Patients daughter, Gertie Exon [phone]      HPI [name] [name] is a 84 y.o. female here for generalized fatigue and productive cough for 1 week. Found to be hypoxic with EMS.  I attempted to call the patient's daughter without any response.  Patient is disoriented and unable to provide good history. Unsure of the patient's baseline mental status.  Remainder of history, ROS, and physical exam limited due to patient's condition (AMS). Additional information was obtained from EMS.   Level V Caveat.    HPI  Past Medical History Past Medical History:  Diagnosis Date  . Arthritis    RA  . CHF (congestive heart failure) ([place])   . CKD (chronic kidney disease) stage 3, GFR 30-59 ml/min (HCC)   . Diabetes mellitus without complication (HCC)    insulin dependent  . Hyperlipidemia   . Hypertension   . Shortness of breath dyspnea   . Sleep apnea    cpap  . TIA (transient ischemic attack) 2015   Patient Active Problem List   Diagnosis Date Noted  . Acute hypoxemic respiratory failure due to COVID-19 ([place]) 07/25/2020  . 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 Chronic idiopathic constipation 09/30/2018  . Chronic diastolic HF (heart failure) ([place]) 05/26/2015  . Type 2 diabetes mellitus with nephropathy ([place])  05/26/2015  . CHF (congestive heart failure) ([place]) 05/25/2015  . Anasarca 05/25/2015  . DM (diabetes mellitus), type 2 with renal complications ([place]) 75/17/0017  . Chronic anemia 05/25/2015  . Hypocalcemia   . Encephalopathy, hypertensive 06/11/2014  . Essential hypertension 06/11/2014  . Type 2 diabetes mellitus without complication ([place]) 49/44/9675  . HLD (hyperlipidemia) 06/11/2014  . OSA (obstructive sleep apnea) 03/06/2014  . Chronic renal disease, stage III ([place]) 01/13/2014  . TIA (transient ischemic attack) 01/11/2014  . Hypertensive urgency 01/11/2014  . Diabetes mellitus ([place]) 01/11/2014   Home Medication(s) Prior to Admission medications   Medication Sig Start Date End Date Taking? Authorizing Provider  acetaminophen (TYLENOL) 500 MG tablet Take 1,000 mg by mouth every 8 (eight) hours as needed for mild pain.    [provider]  amLODipine (NORVASC) 10 MG tablet Take 1 tablet (10 mg total) by mouth daily. 07/13/20   [name], MD  aspirin 325 MG tablet Take 325 mg by mouth daily. For stroke prevention with history of TIA    [provider]  atorvastatin (LIPITOR) 20 MG tablet Take 1 tablet (20 mg total) by mouth daily at 6 PM. For cholesterol 12/04/19   [name], MD  blood glucose meter kit and supplies KIT Dispense based on patient and insurance preference. Use up to four times daily as directed. (FOR ICD-9 250.00, 250.01). 04/01/17   Mackuen, Courteney Lyn, MD  cetirizine (ALLERGY, CETIRIZINE,) 10 MG tablet  Take 10 mg by mouth daily.    [provider]  cloNIDine (CATAPRES) 0.1 MG tablet TAKE 1 TABLET(0.1 MG) BY MOUTH THREE TIMES DAILY 07/13/20   [name], MD  diphenhydrAMINE (BENADRYL) 25 MG tablet Take 25 mg by mouth every 6 (six) hours as needed.    [provider]  furosemide (LASIX) 80 MG tablet TAKE 2 TABLETS BY MOUTH TWICE DAILY 05/23/20   [name], MD  glucose blood test strip Use as directed to check blood sugars  3 times per day dx: e11.65 06/09/20   [name], MD  hydrALAZINE (APRESOLINE) 25 MG tablet TAKE 1 TABLET BY MOUTH THREE TIMES DAILY WITH FOOD 07/13/20   [name], MD  insulin detemir (LEVEMIR) 100 UNIT/ML FlexPen Inject 30 units with breakfast and 15 units before bedtime. 12/04/19   [name], MD  mometasone (ELOCON) 0.1 % cream Apply 1 application topically daily. 01/30/20   Minette Brine, FNP  RAYALDEE 30 MCG CPCR Take 1 capsule by mouth daily. 01/24/20   [provider]                                                                                                                                    Past Surgical History Past Surgical History:  Procedure Laterality Date  . ABDOMINAL HYSTERECTOMY  1974   Family History Family History  Problem Relation Age of Onset  . Diabetes type II Mother   . Hypertension Mother   . Diabetes type II Father   . Hypertension Other   . Hyperlipidemia Other   . Stroke Other     Social History Social History   Tobacco Use  . Smoking status: Never Smoker  . Smokeless tobacco: Never Used  Vaping Use  . Vaping Use: Never used  Substance Use Topics  . Alcohol use: No    Alcohol/week: 0.0 standard drinks  . Drug use: No   Allergies Penicillins  Review of Systems Review of Systems  Unable to perform ROS: Mental status change    Physical Exam Vital Signs  I have reviewed the triage vital signs BP 139/72   Pulse 100   Temp 100 F (37.8 C) (Oral)   Resp 20   Ht 5' (1.524 m)   Wt 69.3 kg   SpO2 95%   BMI 29.84 kg/m   Physical Exam Vitals reviewed.  Constitutional:      General: She is not in acute distress.    Appearance: She is well-developed and well-nourished. She is not diaphoretic.  HENT:     Head: Normocephalic and atraumatic.     Nose: Nose normal.  Eyes:     General: No scleral icterus.       Right eye: No discharge.        Left eye: No discharge.     Extraocular Movements: EOM normal.      Conjunctiva/sclera: Conjunctivae normal.     Pupils: Pupils  are equal, round, and reactive to light.  Cardiovascular:     Rate and Rhythm: Normal rate and regular rhythm.     Heart sounds: No murmur heard. No friction rub. No gallop.   Pulmonary:     Effort: Pulmonary effort is normal. No respiratory distress.     Breath sounds: No stridor. Examination of the right-lower field reveals rales. Examination of the left-lower field reveals rales. Rales present.  Abdominal:     General: There is no distension.     Palpations: Abdomen is soft.     Tenderness: There is no abdominal tenderness.  Musculoskeletal:        General: No tenderness or edema.     Cervical back: Normal range of motion and neck supple.  Skin:    General: Skin is warm and dry.     Findings: No erythema or rash.  Neurological:     Mental Status: She is alert. She is disoriented.     Comments: Oriented to person only  Psychiatric:        Mood and Affect: Mood and affect normal.     ED Results and Treatments Labs (all labs ordered are listed, but only abnormal results are displayed) Labs Reviewed  RESP PANEL BY RT-PCR (FLU A&B, COVID) ARPGX2 - Abnormal; Notable for the following components:      Result Value   SARS Coronavirus 2 by RT PCR POSITIVE (*)    All other components within normal limits  CBC WITH DIFFERENTIAL/PLATELET - Abnormal; Notable for the following components:   WBC 10.8 (*)    Hemoglobin 8.6 (*)    HCT 27.2 (*)    MCV 69.2 (*)    MCH 21.9 (*)    Neutro Abs 9.5 (*)    Abs Immature Granulocytes 0.09 (*)    All other components within normal limits  COMPREHENSIVE METABOLIC PANEL - Abnormal; Notable for the following components:   CO2 18 (*)    Glucose, Bld 219 (*)    BUN 101 (*)    Creatinine, Ser 6.87 (*)    Calcium 7.9 (*)    Albumin 2.8 (*)    GFR, Estimated 6 (*)    Anion gap 18 (*)    All other components within normal limits  BRAIN NATRIURETIC PEPTIDE - Abnormal; Notable for the  following components:   B Natriuretic Peptide 259.8 (*)    All other components within normal limits  BLOOD GAS, VENOUS - Abnormal; Notable for the following components:   pCO2, Ven 39.3 (*)    pO2, Ven 47.4 (*)    Acid-base deficit 4.4 (*)    All other components within normal limits  CBG MONITORING, ED - Abnormal; Notable for the following components:   Glucose-Capillary 207 (*)    All other components within normal limits  CBG MONITORING, ED - Abnormal; Notable for the following components:   Glucose-Capillary 186 (*)    All other components within normal limits  CBG MONITORING, ED - Abnormal; Notable for the following components:   Glucose-Capillary 170 (*)    All other components within normal limits  CBG MONITORING, ED - Abnormal; Notable for the following components:   Glucose-Capillary 145 (*)    All other components within normal limits  CULTURE, BLOOD (SINGLE)  CULTURE, BLOOD (ROUTINE X 2)  CULTURE, BLOOD (ROUTINE X 2)  LACTIC ACID, PLASMA  BETA-HYDROXYBUTYRIC ACID  URINALYSIS, ROUTINE W REFLEX MICROSCOPIC  BETA-HYDROXYBUTYRIC ACID  BETA-HYDROXYBUTYRIC ACID  CBC WITH DIFFERENTIAL/PLATELET  COMPREHENSIVE METABOLIC PANEL  D-DIMER, QUANTITATIVE (NOT AT [facility])  PROCALCITONIN  LACTATE DEHYDROGENASE  FERRITIN  TRIGLYCERIDES  FIBRINOGEN  C-REACTIVE PROTEIN                                                                                                                         EKG  EKG Interpretation  Date/Time:  Saturday July 25 2020 00:17:16 EST Ventricular Rate:  109 PR Interval:    QRS Duration: 80 QT Interval:  317 QTC Calculation: 427 R Axis:   -18 Text Interpretation: Sinus tachycardia Left ventricular hypertrophy No acute changes Confirmed by Addison Lank [phone]) on 07/25/2020 4:09:37 AM      Radiology DG Chest 2 View  Result Date: 07/25/2020 CLINICAL DATA:  COVID-19 EXAM: CHEST - 2 VIEW COMPARISON:  05/25/2015 FINDINGS: Multifocal peripheral  predominant airspace opacity. No pleural effusion or pneumothorax. IMPRESSION: Multifocal peripheral predominant airspace opacity, compatible with multifocal pneumonia. Electronically Signed   By: Ulyses Jarred M.D.   On: 07/25/2020 02:48    Pertinent labs & imaging results that were available during my care of the patient were reviewed by me and considered in my medical decision making (see chart for details).  Medications Ordered in ED Medications  insulin regular, human (MYXREDLIN) 100 units/ 100 mL infusion (2.4 Units/hr Intravenous Rate/Dose Change 07/25/20 0647)  lactated ringers infusion ( Intravenous Not Given 07/25/20 0701)  dextrose 5 % in lactated ringers infusion ( Intravenous New Bag/Given 07/25/20 0553)  dextrose 50 % solution 0-50 mL (has no administration in time range)  lactated ringers bolus 1,386 mL (1,386 mLs Intravenous New Bag/Given 07/25/20 0553)  potassium chloride 10 mEq in 100 mL IVPB (10 mEq Intravenous New Bag/Given 07/25/20 0700)  levofloxacin (LEVAQUIN) IVPB 750 mg (750 mg Intravenous New Bag/Given 07/25/20 0557)                                                                                                                                    Procedures .1-3 Lead EKG Interpretation Performed by: Fatima Blank, MD Authorized by: Fatima Blank, MD     Interpretation: normal     ECG rate:  98   ECG rate assessment: normal     Rhythm: sinus rhythm     Ectopy: none     Conduction: normal   .Critical Care Performed by: Fatima Blank, MD Authorized by: Fatima Blank, MD   Critical care provider statement:  Critical care time (minutes):  60   Critical care was necessary to treat or prevent imminent or life-threatening deterioration of the following conditions:  Respiratory failure and renal failure   Critical care was time spent personally by me on the following activities:  Discussions with consultants, evaluation of  patient's response to treatment, examination of patient, ordering and performing treatments and interventions, ordering and review of laboratory studies, ordering and review of radiographic studies, pulse oximetry, re-evaluation of patient's condition, obtaining history from patient or surrogate and review of old charts    (including critical care time)  Medical Decision Making / ED Course I have reviewed the nursing notes for this encounter and the patient's prior records (if available in EHR or on provided paperwork).   [name] [name] was evaluated in Emergency Department on 07/25/2020 for the symptoms described in the history of present illness. She was evaluated in the context of the global COVID-19 pandemic, which necessitated consideration that the patient might be at risk for infection with the SARS-CoV-2 virus that causes COVID-19. Institutional protocols and algorithms that pertain to the evaluation of patients at risk for COVID-19 are in a state of rapid change based on information released by regulatory bodies including the CDC and federal and state organizations. These policies and algorithms were followed during the patient's care in the ED.  Patient presents for generalized fatigue and cough. She has a low-grade fever, she is normotensive and not tachycardic. Patient came in on oxygen. Placed to room air and patient quickly desatted with sats in the low 80s. Placed back on 5 L nasal cannula. Lungs with bibasilar rales.  Work-up notable for multifocal pneumonia. Treated for possible bacterial pneumonia. Code sepsis initiated. She did not require 30 cc/kg of IV fluids.   Covid was still pending but also being considered.  Labs also notable for acute renal failure. She had a hyperglycemia with anion gap and a bicarb of 18. Treated for possible DKA.  Admitted to medicine for further work-up and management.        Final Clinical Impression(s) / ED Diagnoses Final  diagnoses:  Cough  Acute hypoxemic respiratory failure (HCC)  AKI (acute kidney injury) ([place])  Diabetic ketoacidosis without coma associated with type 2 diabetes mellitus ([place])      This chart was dictated using voice recognition software.  Despite best efforts to proofread,  errors can occur which can change the documentation meaning.   Fatima Blank, MD 07/25/20 [phone]

## 2020-07-25 NOTE — H&P (Signed)
History and Physical        Hospital Admission Note Date: 07/25/2020  Patient name: [name] [name] Medical record number: [id number] Date of birth: [birth year] Age: 84 y.o. Gender: female  PCP: [name], MD  Patient coming from: home Lives with: alone At baseline, ambulates: independently  Chief Complaint    Chief Complaint  Patient presents with  . Fatigue      HPI:   This is an 84 year old female who was brought in by EMS with past medical history of RA, CKD 4, diabetes, hypertension, hyperlipidemia, OSA on CPAP, TIA, chronic diastolic heart failure who presented to the ED with 5 days of productive cough, lethargy and confusion.  Upon EMS arrival, SPO2 85% on room air and placed on 3 LPM with improvement to 92%. Currently patient denies any complaints of chest pain, shortness of breath or any other complaints, however she does appear confused. Per daughter [name], who is POA, states the patient is typically conversant and oriented but has been confused over the past few days.   ED Course: Low-grade fever, tachycardia, tachypnea SPO2 90% on 4 LPM and placed on 6 LPM O2 with improvement notable Labs: Sodium 135, K3.6, CO2 18, glucose 219, BUN 101, creatinine 6.87, calcium 7.9, AG 18, WBC 10.8, Hb 8.6, platelets 241, BNP 260, beta hydroxybutyrate 0.24, COVID-19 positive. Notable Imaging: CXR with multifocal pneumonia. Patient received insulin drip, D5LR and LR bolus, Levaquin and potassium.   Of note, soon after my interaction, I was notified that the patient went into atrial fibrillation with RVR and remained hypertensive and asymptomatic.    Vitals:   07/25/20 1053 07/25/20 1130  BP: (!) 170/86 (!) 140/92  Pulse: (!) 137 (!) 140  Resp: 20 19  Temp:    SpO2: 92% 92%     Review of Systems:  Review of Systems  All other systems reviewed and are  negative.   Medical/Social/Family History   Past Medical History: Past Medical History:  Diagnosis Date  . Arthritis    RA  . CHF (congestive heart failure) ([place])   . CKD (chronic kidney disease) stage 3, GFR 30-59 ml/min (HCC)   . Diabetes mellitus without complication (HCC)    insulin dependent  . Hyperlipidemia   . Hypertension   . Shortness of breath dyspnea   . Sleep apnea    cpap  . TIA (transient ischemic attack) 2015    Past Surgical History:  Procedure Laterality Date  . ABDOMINAL HYSTERECTOMY  1974    Medications: Prior to Admission medications   Medication Sig Start Date End Date Taking? Authorizing Provider  acetaminophen (TYLENOL) 500 MG tablet Take 1,000 mg by mouth every 8 (eight) hours as needed for mild pain.    [provider]  amLODipine (NORVASC) 10 MG tablet Take 1 tablet (10 mg total) by mouth daily. 07/13/20   [name], MD  aspirin 325 MG tablet Take 325 mg by mouth daily. For stroke prevention with history of TIA    [provider]  atorvastatin (LIPITOR) 20 MG tablet Take 1 tablet (20 mg total) by mouth daily at 6 PM. For cholesterol 12/04/19   [name], MD  blood glucose meter kit and supplies KIT  Dispense based on patient and insurance preference. Use up to four times daily as directed. (FOR ICD-9 250.00, 250.01). 04/01/17   Mackuen, Courteney Lyn, MD  cetirizine (ALLERGY, CETIRIZINE,) 10 MG tablet Take 10 mg by mouth daily.    [provider]  cloNIDine (CATAPRES) 0.1 MG tablet TAKE 1 TABLET(0.1 MG) BY MOUTH THREE TIMES DAILY 07/13/20   [name], MD  diphenhydrAMINE (BENADRYL) 25 MG tablet Take 25 mg by mouth every 6 (six) hours as needed.    [provider]  furosemide (LASIX) 80 MG tablet TAKE 2 TABLETS BY MOUTH TWICE DAILY 05/23/20   [name], MD  glucose blood test strip Use as directed to check blood sugars 3 times per day dx: e11.65 06/09/20   [name], MD  hydrALAZINE (APRESOLINE)  25 MG tablet TAKE 1 TABLET BY MOUTH THREE TIMES DAILY WITH FOOD 07/13/20   [name], MD  insulin detemir (LEVEMIR) 100 UNIT/ML FlexPen Inject 30 units with breakfast and 15 units before bedtime. 12/04/19   [name], MD  mometasone (ELOCON) 0.1 % cream Apply 1 application topically daily. 01/30/20   Minette Brine, FNP  RAYALDEE 30 MCG CPCR Take 1 capsule by mouth daily. 01/24/20   [provider]    Allergies:   Allergies  Allergen Reactions  . Penicillins Swelling and Rash    Social History:  reports that she has never smoked. She has never used smokeless tobacco. She reports that she does not drink alcohol and does not use drugs.  Family History: Family History  Problem Relation Age of Onset  . Diabetes type II Mother   . Hypertension Mother   . Diabetes type II Father   . Hypertension Other   . Hyperlipidemia Other   . Stroke Other      Objective   Physical Exam: Blood pressure (!) 140/92, pulse (!) 140, temperature 100 F (37.8 C), temperature source Oral, resp. rate 19, height 5' (1.524 m), weight 69.7 kg, SpO2 92 %.  Physical Exam Vitals and nursing note reviewed.  Constitutional:      General: She is not in acute distress. HENT:     Head: Normocephalic.  Eyes:     Conjunctiva/sclera: Conjunctivae normal.  Cardiovascular:     Rate and Rhythm: Tachycardia present. Rhythm irregular.  Pulmonary:     Effort: Pulmonary effort is normal. No respiratory distress.     Comments: cough Abdominal:     General: Abdomen is flat. There is no distension.  Musculoskeletal:        General: No swelling or tenderness. Normal range of motion.  Skin:    General: Skin is warm.     Coloration: Skin is not jaundiced.  Neurological:     Mental Status: She is alert. She is disoriented.     Comments: Awake and alert, oriented x 1 to place  Psychiatric:        Mood and Affect: Mood normal.     LABS on Admission: I have personally reviewed all the labs and  imaging below    Basic Metabolic Panel: Recent Labs  Lab 07/25/20 0300 07/25/20 0840  NA 135 135  K 3.6 3.5  CL 99 101  CO2 18* 18*  GLUCOSE 219* 124*  BUN 101* 93*  CREATININE 6.87* 6.04*  CALCIUM 7.9* 7.9*    Liver Function Tests: Recent Labs  Lab 07/25/20 0300 07/25/20 0840  AST 34 35  ALT 16 15  ALKPHOS 57 53  BILITOT 0.5 0.3  PROT 7.9 7.7  ALBUMIN 2.8* 2.7*   No results for input(s): LIPASE, AMYLASE in the last 168 hours. No results for input(s): AMMONIA in the last 168 hours. CBC: Recent Labs  Lab 07/25/20 0300 07/25/20 0840  WBC 10.8* 10.6*  NEUTROABS 9.5* 8.9*  HGB 8.6* 7.4*  HCT 27.2* 23.3*  MCV 69.2* 69.3*  PLT 241 267   Cardiac Enzymes: No results for input(s): CKTOTAL, CKMB, CKMBINDEX, TROPONINI in the last 168 hours. BNP: Invalid input(s): POCBNP CBG: Recent Labs  Lab 07/25/20 0756 07/25/20 0932  GLUCAP 145* 105*    Radiological Exams on Admission:  DG Chest 2 View  Result Date: 07/25/2020 CLINICAL DATA:  COVID-19 EXAM: CHEST - 2 VIEW COMPARISON:  05/25/2015 FINDINGS: Multifocal peripheral predominant airspace opacity. No pleural effusion or pneumothorax. IMPRESSION: Multifocal peripheral predominant airspace opacity, compatible with multifocal pneumonia. Electronically Signed   By: Ulyses Jarred M.D.   On: 07/25/2020 02:48      EKG: atrial fibrillation, rate 147   A & P   Principal Problem:   Acute hypoxemic respiratory failure due to COVID-19 [facility]) Active Problems:   Essential hypertension   HLD (hyperlipidemia)   DM (diabetes mellitus), type 2 with renal complications (HCC)   Chronic diastolic HF (heart failure) (HCC)   CAP (community acquired pneumonia)   CKD (chronic kidney disease), stage IV (HCC)   AKI (acute kidney injury) ([place])   Sepsis ([place])   Atrial fibrillation with RVR ([place])   1. Acute hypoxic respiratory failure secondary to COVID-19 and suspected CAP a. SPO2 85% on room air per EMS, currently requiring 6  LPM to remain > 90% b. Procalcitonin 2.35 -change Levaquin to ceftriaxone/doxycycline c. CRP 26, D-dimer 1.36 d. Holding remdesivir due to renal dysfunction e. Holding baricitinib given CAP f. Treat with steroids g. Incentive spirometry and flutter valve  2. Sepsis without septic shock secondary to COVID-19 and suspected CAP a. Sepsis criteria: Tachycardia, tachypnea, leukocytosis without lactic acidosis b. Received IV fluids in the ED c. Follow-up blood cultures d. Remaining treatment as above  3. AKI on CKD 4 a. Baseline creatinine 2.4-2.7, creatinine 6.87 on presentation b. Hold nephrotoxic agents c. Nephrology consulted, appreciate recommendations  4. New onset A. fib with RVR a. EKG confirmed b. CHA2DS2-VASc at least 7: Age, female, hypertension, TIA, diabetes c. Cardizem bolus and start drip if she does not convert d. Heparin drip  5. Acute toxic metabolic encephalopathy, likely secondary to COVID-19 and uremia a. Awake and alert, oriented x1 to place b. Treatment as above  6. Anion gap metabolic acidosis likely secondary to AKI a. Hyperglycemia (219) with anion gap metabolic acidosis on presentation initially concerning for DKA in the ED and started on insulin drip and IV fluids. I do not believe she is in DKA b. Stop insulin drip and IV fluids per DKA protocol c. Start on insulin per COVID-19 steroid order set  7. Type 2 diabetes with hyperglycemia a. As above  8. Hypertension a. Starting on Cardizem, holding home meds  9. Chronic diastolic CHF, not in overt exacerbation a. Daily weights and intake/output  10. History of TIA a. Hold home high-dose aspirin in setting of heparin drip b. Continue statin   DVT prophylaxis: Heparin drip   Code Status: Full Code  Diet: Renal/carb modified with fluid restriction Family Communication: Admission, patients condition and plan of care including tests being ordered have been discussed with the patient who indicates  understanding and agrees with the plan and Code Status.   Patient's daughter [name] was  updated by phone who is POA.  Awaiting call from patient's son who is an internal medicine physician  Disposition Plan: The appropriate patient status for this patient is INPATIENT. Inpatient status is judged to be reasonable and necessary in order to provide the required intensity of service to ensure the patient's safety. The patient's presenting symptoms, physical exam findings, and initial radiographic and laboratory data in the context of their chronic comorbidities is felt to place them at high risk for further clinical deterioration. Furthermore, it is not anticipated that the patient will be medically stable for discharge from the hospital within 2 midnights of admission. The following factors support the patient status of inpatient.   " The patient's presenting symptoms include fatigue. " The worrisome physical exam findings include A. fib with RVR. " The initial radiographic and laboratory data are worrisome because of AKI, elevated procalcitonin. " The chronic co-morbidities include HFpEF, diabetes, hypertension, CKD 4, TIA.   * I certify that at the point of admission it is my clinical judgment that the patient will require inpatient hospital care spanning beyond 2 midnights from the point of admission due to high intensity of service, high risk for further deterioration and high frequency of surveillance required.*   Status is: Inpatient  Remains inpatient appropriate because:Hemodynamically unstable, Altered mental status, IV treatments appropriate due to intensity of illness or inability to take PO and Inpatient level of care appropriate due to severity of illness   Dispo: The patient is from: Home              Anticipated d/c is to: Home              Anticipated d/c date is: > 3 days              Patient currently is not medically stable to d/c.         The medical decision making  on this patient was of high complexity and the patient is at high risk for clinical deterioration, therefore this is a level 3 admission.  Consultants  . Nephrology  Procedures  . None  Time Spent on Admission: 85 minutes    Harold Hedge, DO [facility]  07/25/2020, 11:44 AM

## 2020-07-25 NOTE — Plan of Care (Signed)
  Problem: Education: Goal: Knowledge of General Education information will improve Description: Including pain rating scale, medication(s)/side effects and non-pharmacologic comfort measures Outcome: Progressing   Problem: Health Behavior/Discharge Planning: Goal: Ability to manage health-related needs will improve Outcome: Progressing   Problem: Clinical Measurements: Goal: Ability to maintain clinical measurements within normal limits will improve Outcome: Progressing Goal: Will remain free from infection Outcome: Progressing Goal: Diagnostic test results will improve Outcome: Progressing Goal: Respiratory complications will improve Outcome: Progressing Goal: Cardiovascular complication will be avoided Outcome: Progressing   Problem: Activity: Goal: Risk for activity intolerance will decrease Outcome: Progressing   Problem: Nutrition: Goal: Adequate nutrition will be maintained Outcome: Progressing   Problem: Coping: Goal: Level of anxiety will decrease Outcome: Progressing   Problem: Elimination: Goal: Will not experience complications related to bowel motility Outcome: Progressing Goal: Will not experience complications related to urinary retention Outcome: Progressing   Problem: Pain Managment: Goal: General experience of comfort will improve Outcome: Progressing   Problem: Safety: Goal: Ability to remain free from injury will improve Outcome: Progressing   Problem: Skin Integrity: Goal: Risk for impaired skin integrity will decrease Outcome: Progressing   Problem: Education: Goal: Knowledge of risk factors and measures for prevention of condition will improve Outcome: Progressing   Problem: Coping: Goal: Psychosocial and spiritual needs will be supported Outcome: Progressing   Problem: Respiratory: Goal: Will maintain a patent airway Outcome: Progressing Goal: Complications related to the disease process, condition or treatment will be avoided or  minimized Outcome: Progressing   Problem: Education: Goal: Ability to describe self-care measures that may prevent or decrease complications (Diabetes Survival Skills Education) will improve Outcome: Progressing Goal: Individualized Educational Video(s) Outcome: Progressing   Problem: Coping: Goal: Ability to adjust to condition or change in health will improve Outcome: Progressing   Problem: Fluid Volume: Goal: Ability to maintain a balanced intake and output will improve Outcome: Progressing   Problem: Health Behavior/Discharge Planning: Goal: Ability to identify and utilize available resources and services will improve Outcome: Progressing Goal: Ability to manage health-related needs will improve Outcome: Progressing   Problem: Metabolic: Goal: Ability to maintain appropriate glucose levels will improve Outcome: Progressing   Problem: Nutritional: Goal: Maintenance of adequate nutrition will improve Outcome: Progressing Goal: Progress toward achieving an optimal weight will improve Outcome: Progressing   Problem: Skin Integrity: Goal: Risk for impaired skin integrity will decrease Outcome: Progressing   Problem: Tissue Perfusion: Goal: Adequacy of tissue perfusion will improve Outcome: Progressing   

## 2020-07-25 NOTE — ED Notes (Signed)
Blood cultures collected after antibiotics began.

## 2020-07-25 NOTE — Progress Notes (Signed)
ANTICOAGULATION CONSULT NOTE - Initial Consult  Pharmacy Consult for Heparin Indication: atrial fibrillation  Allergies  Allergen Reactions  . Penicillins Swelling and Rash    Patient Measurements: Height: 5' (152.4 cm) Weight: 69.7 kg (153 lb 9.6 oz) IBW/kg (Calculated) : 45.5 Heparin Dosing Weight: 60 kg  Vital Signs: Temp: 100 F (37.8 C) (12/18 0018) Temp Source: Oral (12/18 0018) BP: 170/86 (12/18 1053) Pulse Rate: 137 (12/18 1053)  Labs: Recent Labs    07/25/20 0300 07/25/20 0840  HGB 8.6* 7.4*  HCT 27.2* 23.3*  PLT 241 267  CREATININE 6.87* 6.04*    Estimated Creatinine Clearance: 6.3 mL/min (A) (by C-G formula based on SCr of 6.04 mg/dL (H)).   Medical History: Past Medical History:  Diagnosis Date  . Arthritis    RA  . CHF (congestive heart failure) ([place])   . CKD (chronic kidney disease) stage 3, GFR 30-59 ml/min (HCC)   . Diabetes mellitus without complication (HCC)    insulin dependent  . Hyperlipidemia   . Hypertension   . Shortness of breath dyspnea   . Sleep apnea    cpap  . TIA (transient ischemic attack) 2015    Medications:  Scheduled:  . aspirin  325 mg Oral Daily  . atorvastatin  20 mg Oral q1800  . diltiazem  10 mg Intravenous Once  . insulin aspart  0-9 Units Subcutaneous TID WC  . insulin detemir  0.075 Units/kg Subcutaneous BID  . Ipratropium-Albuterol  1 puff Inhalation Q6H  . linagliptin  5 mg Oral Daily  . methylPREDNISolone (SOLU-MEDROL) injection  0.5 mg/kg Intravenous Q12H   Followed by  . [START ON 07/28/2020] predniSONE  50 mg Oral Daily  . sodium chloride flush  3 mL Intravenous Q12H   Infusions:  . diltiazem (CARDIZEM) infusion     PRN: acetaminophen, dextrose, guaiFENesin-dextromethorphan  Assessment: 84 yo female presents with  cough, lethargy and confusion found to be +COVID.  Now in afib, starting on diltiazem drip and pharmacy consulted to dose IV heparin.   Baseline aPTT, PT/INR pending  Hgb 7.4, no  overt bleeding reported per d/w RN; Plts wnl  SCr 6.04 with hx CKD stage IV    No anticoagulants PTA, +aspirin 325mg  daily  Goal of Therapy:  Heparin level 0.3-0.7 units/ml Monitor platelets by anticoagulation protocol: Yes   Plan:  No heparin bolus since 5000 units SQ already given at 10:45 Begin IV heparin infusion at 900 units/hr Check heparin level in 8hrs Monitor closely for signs/symptoms of bleeding  Peggyann Juba, PharmD, BCPS Pharmacy: [phone] 07/25/2020,11:15 AM

## 2020-07-25 NOTE — Progress Notes (Signed)
ANTICOAGULATION CONSULT NOTE -   Pharmacy Consult for Heparin Indication: atrial fibrillation  Allergies  Allergen Reactions  . Penicillins Swelling and Rash    Patient Measurements: Height: 5\' 1"  (154.9 cm) Weight: 66.5 kg (146 lb 9.7 oz) IBW/kg (Calculated) : 47.8 Heparin Dosing Weight: 60 kg  Vital Signs: Temp: 99.5 F (37.5 C) (12/18 2000) Temp Source: Oral (12/18 2000) BP: 172/84 (12/18 2142) Pulse Rate: 98 (12/18 2142)  Labs: Recent Labs    07/25/20 0300 07/25/20 0840 07/25/20 1113 07/25/20 2100  HGB 8.6* 7.4*  --   --   HCT 27.2* 23.3*  --   --   PLT 241 267  --   --   APTT  --   --  32  --   LABPROT  --   --  15.2  --   INR  --   --  1.2  --   HEPARINUNFRC  --   --   --  0.69  CREATININE 6.87* 6.04*  --   --     Estimated Creatinine Clearance: 6.3 mL/min (A) (by C-G formula based on SCr of 6.04 mg/dL (H)).   Medical History: Past Medical History:  Diagnosis Date  . Arthritis    RA  . CHF (congestive heart failure) ([place])   . CKD (chronic kidney disease) stage 3, GFR 30-59 ml/min (HCC)   . Diabetes mellitus without complication (HCC)    insulin dependent  . Hyperlipidemia   . Hypertension   . Shortness of breath dyspnea   . Sleep apnea    cpap  . TIA (transient ischemic attack) 2015    Medications:  Scheduled:  . amiodarone  150 mg Intravenous Once  . atorvastatin  20 mg Oral q1800  . chlorhexidine  15 mL Mouth Rinse BID  . Chlorhexidine Gluconate Cloth  6 each Topical Daily  . insulin aspart  0-9 Units Subcutaneous TID WC  . insulin detemir  0.075 Units/kg Subcutaneous BID  . [START ON 07/26/2020] Ipratropium-Albuterol  1 puff Inhalation BID  . linagliptin  5 mg Oral Daily  . mouth rinse  15 mL Mouth Rinse BID  . mouth rinse  15 mL Mouth Rinse q12n4p  . methylPREDNISolone (SOLU-MEDROL) injection  0.5 mg/kg Intravenous Q12H   Followed by  . [START ON 07/28/2020] predniSONE  50 mg Oral Daily  . sodium chloride flush  3 mL Intravenous  Q12H   Infusions:  . sodium chloride 75 mL/hr at 07/25/20 2022  . amiodarone Stopped (07/25/20 1940)  . [START ON 07/26/2020] cefTRIAXone (ROCEPHIN)  IV    . diltiazem (CARDIZEM) infusion 5 mg/hr (07/25/20 2200)  . [START ON 07/26/2020] doxycycline (VIBRAMYCIN) IV    . heparin 900 Units/hr (07/25/20 1854)   PRN: acetaminophen, dextrose, guaiFENesin-dextromethorphan  Assessment: 84 yo female presents with  cough, lethargy and confusion found to be +COVID.  Now in afib, starting on diltiazem drip and pharmacy consulted to dose IV heparin.   Baseline aPTT, PT/INR WNL  Hgb 7.4,  Plts wnl  SCr 6.04 with hx CKD stage IV    No anticoagulants PTA, +aspirin 325mg  daily  HL 0.69 therapeutic, no bleeding or line issues per RN  Goal of Therapy:  Heparin level 0.3-0.7 units/ml Monitor platelets by anticoagulation protocol: Yes   Plan:  continue IV heparin infusion at 900 units/hr Check heparin level in 8hrs Monitor closely for signs/symptoms of bleeding  Dolly Rias RPh 07/25/2020, 10:35 PM

## 2020-07-25 NOTE — ED Triage Notes (Signed)
Patient BIB [facility] coming from home. Patient does not have complaints but daughter said she has not eaten well the past 5 days. Patient has had a productive cough for a week.   EMS  20G right AC CBG 275 BP 164/86 HR 100 RR 18 O2 85% Room air EMS put her on 3L and it came up to 92%  Patients daughter reports patient wears a CPAP at night.  Patients daughter, Gertie Exon [phone]

## 2020-07-25 NOTE — Consult Note (Addendum)
Renal Service Consult Note [facility]  [name] [name] 07/25/2020 [name] Blazing, MD Requesting Physician: [name] [name], [name] [name].   Reason for Consult: Renal failure HPI: The patient is a 84 y.o. year-old w/ hx of HTN, DM2, CKD IV f/b [name] [name] presented to ED today w/ 5d hx ofcough, lethargy and confusion. In ED sats 85%, low grade fever, tachycardia, BUN 101 and creat 6.87.  COVID-19 +. CXR w/ multifocal pna. +beta hydroxybutyrate, glu 210. Admitted, started on IV insulin, D5LR/ LR bolus, Levaquin and potassium.  +procalcitonin, switched to rocephin/ doxy, and holding remdesivir and baricitinib due to renal failure and CAP respectively. Pt had afib / RVR in ED and seen by cards, plan is for IV amiodarone for now. BP's tolerating afib , normal to high.  Asked to see for renal failure.   Pt poor historian today, confused but pleasant. Remembers she is f/b [name] [name] for kidney disease.   ROS  denies CP  no joint pain   no HA  no blurry vision  no rash  no dysuria  no difficulty voiding  no change in urine color    Past Medical History  Past Medical History:  Diagnosis Date  . Arthritis    RA  . CHF (congestive heart failure) ([place])   . CKD (chronic kidney disease) stage 3, GFR 30-59 ml/min (HCC)   . Diabetes mellitus without complication (HCC)    insulin dependent  . Hyperlipidemia   . Hypertension   . Shortness of breath dyspnea   . Sleep apnea    cpap  . TIA (transient ischemic attack) 2015   Past Surgical History  Past Surgical History:  Procedure Laterality Date  . ABDOMINAL HYSTERECTOMY  1974   Family History  Family History  Problem Relation Age of Onset  . Diabetes type II Mother   . Hypertension Mother   . Diabetes type II Father   . Hypertension Other   . Hyperlipidemia Other   . Stroke Other    Social History  reports that she has never smoked. She has never used smokeless tobacco. She reports that she does not drink alcohol  and does not use drugs. Allergies  Allergies  Allergen Reactions  . Penicillins Swelling and Rash   Home medications Prior to Admission medications   Medication Sig Start Date End Date Taking? Authorizing Provider  amLODipine (NORVASC) 10 MG tablet Take 1 tablet (10 mg total) by mouth daily. 07/13/20  Yes [name], MD  aspirin 325 MG tablet Take 325 mg by mouth daily. For stroke prevention with history of TIA   Yes [provider]  atorvastatin (LIPITOR) 20 MG tablet Take 1 tablet (20 mg total) by mouth daily at 6 PM. For cholesterol 12/04/19  Yes [name], MD  calcitRIOL (ROCALTROL) 0.25 MCG capsule Take 0.25 mcg by mouth daily. 07/06/20  Yes [provider]  cetirizine (ZYRTEC) 10 MG tablet Take 10 mg by mouth daily.   Yes [provider]  cloNIDine (CATAPRES) 0.1 MG tablet TAKE 1 TABLET(0.1 MG) BY MOUTH THREE TIMES DAILY Patient taking differently: Take 0.1 mg by mouth 3 (three) times daily. 07/13/20  Yes [name], MD  furosemide (LASIX) 80 MG tablet TAKE 2 TABLETS BY MOUTH TWICE DAILY Patient taking differently: Take 160 mg by mouth 2 (two) times daily. 05/23/20  Yes [name], MD  hydrALAZINE (APRESOLINE) 25 MG tablet TAKE 1 TABLET BY MOUTH THREE TIMES DAILY WITH FOOD Patient taking differently: Take 25 mg by mouth  with breakfast, with lunch, and with evening meal. 07/13/20  Yes [name], MD  insulin detemir (LEVEMIR) 100 UNIT/ML FlexPen Inject 30 units with breakfast and 15 units before bedtime. Patient taking differently: Inject 15-30 Units into the skin See admin instructions. Inject 30 units subcutaneously with breakfast as needed for high blood sugar and 15 units subcutaneously before bedtime as needed for high blood sugar. 12/04/19  Yes [name], MD  blood glucose meter kit and supplies KIT Dispense based on patient and insurance preference. Use up to four times daily as directed. (FOR ICD-9 250.00, 250.01). 04/01/17   [place],  Courteney Lyn, MD  glucose blood test strip Use as directed to check blood sugars 3 times per day dx: e11.65 06/09/20   [name], MD  mometasone (ELOCON) 0.1 % cream Apply 1 application topically daily. Patient not taking: No sig reported 01/30/20   Minette Brine, FNP     Vitals:   07/25/20 1230 07/25/20 1300 07/25/20 1330 07/25/20 1500  BP: (!) 141/79 (!) 149/81 (!) 146/102 131/75  Pulse: (!) 143 (!) 129 (!) 138 (!) 101  Resp: 20 (!) _0 Temp:      TempSrc:      SpO2: 92% 92% 92% 91%  Weight:      Height:    _1  (1.549 m)   Exam Gen alert, WD and WN elderly lady, no distress No rash, cyanosis or gangrene Sclera anicteric, throat clear, slightly dry mouth  No jvd or bruits, flat neck veins Chest occ rhonchi, no rales/ wheezing RRR no MRG tachy Abd soft ntnd no mass or ascites +bs GU defer MS no joint effusions or deformity Ext no leg or UE edema, no wounds or ulcers Neuro is alert, Ox 3 , nf, no asterixis or confusion    Home meds:  - norvasc 10/ clonidine 0.1 tid/ lasix 160 bid/ hydralazine 25 tid  - insulin detemir 30 am 15 hs  - asa 325/ lipitor 20  - prn's/ vitamins/ supplements     Date  Creat  eGFR   2008- 14 1.2- 1.6   2015   1.9- 2.3   2015- 19 2.3- 2.8   2020  2.54- 2.79 18- 20   01/30/20 3.09  15   05/05/20 2.73  18   08/04/20 6.04  6    last UA 01/30/20 >> negative  CXR 12/18 - IMPRESSION: Multifocal peripheral predominant airspace opacity, compatible with multifocal pneumonia.   BP's normal to high , 4- 6 L [place], 94%  Assessment/ Plan: 1. AKI on CKD 4 - b/l creat 2.7- 3.0 from 2021, eGFR 15- 18 ml/min. BP's up, +vol deficit on exam, has not been eating much per the ED notes.  No acei/ARB or other toxins.  In ICU today pt is responsive and cooperative, no signs of gross uremia. Has rec'd 1.5- 2 L IVF in ED so far. Recommend cont IVF's as pt looks dry. Get UA, urine lytes and renal US.  F/u creat in am. Will follow.  2. COVID+ pna - getting IV  steroids , per primary team 3. Atrial fib w/ RVR - started on IV amio and IV diltiazem and IV heparin 4. DM2 - per pmd 5. Sepsis w/o hypotension - due to PNA, COVID-19. SP LR bolus 1.4 L bolus 6. HTN - don't treat too aggressively given high risk for sepsis and hypotension.  7. Anemia d/t chronic disease - Hb 8.6 > 7.4      Rob Doctor, hospital  MD 07/25/2020, 4:40 PM  Recent Labs  Lab 07/25/20 0300 07/25/20 0840  WBC 10.8* 10.6*  HGB 8.6* 7.4*   Recent Labs  Lab 07/25/20 0300 07/25/20 0840  K 3.6 3.5  BUN 101* 93*  CREATININE 6.87* 6.04*  CALCIUM 7.9* 7.9*

## 2020-07-25 NOTE — ED Notes (Signed)
Daughter- [name] [name] [phone]

## 2020-07-25 NOTE — ED Notes (Signed)
Date and time results received: 07/25/20 605   Test: Covid 19 Critical Value: POSITIVE  Name of Provider Notified: Dr. Leonette Monarch  Orders Received? Or Actions Taken?

## 2020-07-26 DIAGNOSIS — U071 COVID-19: Secondary | ICD-10-CM | POA: Diagnosis not present

## 2020-07-26 DIAGNOSIS — N184 Chronic kidney disease, stage 4 (severe): Secondary | ICD-10-CM | POA: Diagnosis not present

## 2020-07-26 DIAGNOSIS — N179 Acute kidney failure, unspecified: Secondary | ICD-10-CM | POA: Diagnosis not present

## 2020-07-26 DIAGNOSIS — J9601 Acute respiratory failure with hypoxia: Secondary | ICD-10-CM | POA: Diagnosis not present

## 2020-07-26 LAB — COMPREHENSIVE METABOLIC PANEL
ALT: 19 U/L (ref 0–44)
AST: 40 U/L (ref 15–41)
Albumin: 2.8 g/dL — ABNORMAL LOW (ref 3.5–5.0)
Alkaline Phosphatase: 60 U/L (ref 38–126)
Anion gap: 20 — ABNORMAL HIGH (ref 5–15)
BUN: 96 mg/dL — ABNORMAL HIGH (ref 8–23)
CO2: 13 mmol/L — ABNORMAL LOW (ref 22–32)
Calcium: 8.6 mg/dL — ABNORMAL LOW (ref 8.9–10.3)
Chloride: 99 mmol/L (ref 98–111)
Creatinine, Ser: 6.63 mg/dL — ABNORMAL HIGH (ref 0.44–1.00)
GFR, Estimated: 6 mL/min — ABNORMAL LOW (ref >60)
Glucose, Bld: 312 mg/dL — ABNORMAL HIGH (ref 70–99)
Potassium: 3.9 mmol/L (ref 3.5–5.1)
Sodium: 132 mmol/L — ABNORMAL LOW (ref 135–145)
Total Bilirubin: 0.8 mg/dL (ref 0.3–1.2)
Total Protein: 8 g/dL (ref 6.5–8.1)

## 2020-07-26 LAB — CBC WITH DIFFERENTIAL/PLATELET
Abs Immature Granulocytes: 0.3 10*3/uL — ABNORMAL HIGH (ref 0.00–0.07)
Basophils Absolute: 0 10*3/uL (ref 0.0–0.1)
Basophils Relative: 0 %
Eosinophils Absolute: 0 10*3/uL (ref 0.0–0.5)
Eosinophils Relative: 0 %
HCT: 26.7 % — ABNORMAL LOW (ref 36.0–46.0)
Hemoglobin: 8.5 g/dL — ABNORMAL LOW (ref 12.0–15.0)
Immature Granulocytes: 3 %
Lymphocytes Relative: 4 %
Lymphs Abs: 0.5 10*3/uL — ABNORMAL LOW (ref 0.7–4.0)
MCH: 22 pg — ABNORMAL LOW (ref 26.0–34.0)
MCHC: 31.8 g/dL (ref 30.0–36.0)
MCV: 69 fL — ABNORMAL LOW (ref 80.0–100.0)
Monocytes Absolute: 0.3 10*3/uL (ref 0.1–1.0)
Monocytes Relative: 2 %
Neutro Abs: 10.9 10*3/uL — ABNORMAL HIGH (ref 1.7–7.7)
Neutrophils Relative %: 91 %
Platelets: 312 10*3/uL (ref 150–400)
RBC: 3.87 MIL/uL (ref 3.87–5.11)
RDW: 15.2 % (ref 11.5–15.5)
WBC: 11.9 10*3/uL — ABNORMAL HIGH (ref 4.0–10.5)
nRBC: 0 % (ref 0.0–0.2)

## 2020-07-26 LAB — GLUCOSE, CAPILLARY
Glucose-Capillary: 285 mg/dL — ABNORMAL HIGH (ref 70–99)
Glucose-Capillary: 247 mg/dL — ABNORMAL HIGH (ref 70–99)
Glucose-Capillary: 177 mg/dL — ABNORMAL HIGH (ref 70–99)
Glucose-Capillary: 158 mg/dL — ABNORMAL HIGH (ref 70–99)

## 2020-07-26 LAB — D-DIMER, QUANTITATIVE: D-Dimer, Quant: 1.6 ug/mL-FEU — ABNORMAL HIGH (ref 0.00–0.50)

## 2020-07-26 LAB — HEPARIN LEVEL (UNFRACTIONATED)
Heparin Unfractionated: 0.86 IU/mL — ABNORMAL HIGH (ref 0.30–0.70)
Heparin Unfractionated: 0.96 IU/mL — ABNORMAL HIGH (ref 0.30–0.70)

## 2020-07-26 LAB — C-REACTIVE PROTEIN: CRP: 22.2 mg/dL — ABNORMAL HIGH (ref <1.0)

## 2020-07-26 LAB — FERRITIN: Ferritin: 1227 ng/mL — ABNORMAL HIGH (ref 11–307)

## 2020-07-26 MED ORDER — AMLODIPINE BESYLATE 10 MG PO TABS
10.0000 mg | ORAL_TABLET | Freq: Every day | ORAL | Status: DC
  Administered 2020-07-26 – 2020-08-09 (×14): 10 mg via ORAL
  Filled 2020-07-26 (×16): qty 1

## 2020-07-26 MED ORDER — CLONIDINE HCL 0.1 MG PO TABS
0.1000 mg | ORAL_TABLET | Freq: Three times a day (TID) | ORAL | Status: DC
  Administered 2020-07-26 – 2020-08-14 (×51): 0.1 mg via ORAL
  Filled 2020-07-26 (×53): qty 1

## 2020-07-26 NOTE — Progress Notes (Addendum)
PROGRESS NOTE  [name] [name] [place] FWY:[id number] DOB: 12-17-1935 DOA: 07/25/2020 PCP: [name], MD   LOS: 1 day   Brief Narrative / Interim history: 84 year old female with history of RA, CKD 4, DM 2, HTN, HLD, OSA on CPAP, TIA, chronic diastolic CHF.  To the hospital with 5 days of cough, lethargy, confusion.  Was found to be hypoxic, tested positive for Covid and was admitted to the hospital.  She was also found to have acute kidney injury.  She was also found to have A. fib with RVR.  Cardiology and nephrology were consulted on admission.  Subjective / 24h Interval events: She is in bed, tells me that she is doing a little bit better.  States that her breathing is much better.  Denies any chest pain, no abdominal pain, no nausea or vomiting  Assessment & Plan:  Principal Problem Acute Hypoxic Respiratory Failure due to Covid-19 Viral Illness -Patient started on remdesivir, steroids, continue -Procalcitonin was elevated and was started on ceftriaxone and doxycycline, continue.  Hold baricitinib for now as clinically she appears to be improving -Currently on 6 L nasal cannula, goal sats 88%, incentive spirometry, flutter valve, proning as able      COVID-19 Labs  Recent Labs    07/25/20 0840  DDIMER 1.36*  FERRITIN 2,497*  LDH 295*  CRP 25.8*    Lab Results  Component Value Date   SARSCOV2NAA POSITIVE (A) 07/25/2020    Active Problems Sepsis due to COVID-19 and suspected CAP -Met sepsis criteria with tachycardia, tachypnea, leukocytosis -Cultures obtained, placed on antibiotics, continue  Acute kidney injury on chronic kidney disease stage IV -Baseline creatinine around 2.4-2.7, creatinine 6.8 on presentation.  Nephrology consulted -Has been placed on IV fluids -Discussed with Dr. Jonnie Finner this morning, requesting transfer to [facility] for potential need for dialysis  Anion gap metabolic acidosis -In the setting of renal failure  A. fib with  RVR -Appreciate cardiology consult, patient started on amiodarone as she did not respond to diltiazem infusion.  Started on anticoagulation with heparin -Heart rate much improved this morning  Acute metabolic encephalopathy, secondary to COVID-19 and uremia -Monitor mental status  Essential hypertension -Hypertensive this morning, resume amlodipine, may need to start clonidine if remains hypertensive.  Hold furosemide  Chronic diastolic CHF -Appears euvolemic, no clear evidence of fluid overload -BNP slightly elevated on admission at 259.  Monitor  History of TIA -Hold aspirin in the setting of heparin infusion, continue statin  DM2 with steroid-induced hyperglycemia -Continue Levemir, sliding scale, Tradjenta  CBG (last 3)  Recent Labs    07/25/20 1159 07/25/20 1645 07/25/20 1955  GLUCAP 123* 210* 210*     Scheduled Meds: . amiodarone  150 mg Intravenous Once  . atorvastatin  20 mg Oral q1800  . chlorhexidine  15 mL Mouth Rinse BID  . Chlorhexidine Gluconate Cloth  6 each Topical Daily  . insulin aspart  0-9 Units Subcutaneous TID WC  . insulin detemir  0.075 Units/kg Subcutaneous BID  . Ipratropium-Albuterol  1 puff Inhalation BID  . linagliptin  5 mg Oral Daily  . mouth rinse  15 mL Mouth Rinse BID  . mouth rinse  15 mL Mouth Rinse q12n4p  . methylPREDNISolone (SOLU-MEDROL) injection  0.5 mg/kg Intravenous Q12H   Followed by  . [START ON 07/28/2020] predniSONE  50 mg Oral Daily  . sodium chloride flush  3 mL Intravenous Q12H   Continuous Infusions: . sodium chloride 75 mL/hr at 07/25/20 2022  . amiodarone Stopped (  07/25/20 1940)  . cefTRIAXone (ROCEPHIN)  IV    . diltiazem (CARDIZEM) infusion 5 mg/hr (07/25/20 2200)  . doxycycline (VIBRAMYCIN) IV    . heparin 900 Units/hr (07/26/20 0412)   PRN Meds:.acetaminophen, dextrose, guaiFENesin-dextromethorphan  DVT prophylaxis: heparin Code Status: Full code Family Communication: d/w daughter Caroline over the  phone  Status is: Inpatient  Remains inpatient appropriate because:Inpatient level of care appropriate due to severity of illness   Dispo: The patient is from: Home              Anticipated d/c is to: Home              Anticipated d/c date is: > 3 days              Patient currently is not medically stable to d/c.  Consultants:  Nephrology Cardiology   Procedures:  none  Microbiology: none  Antibacterials: Ceftriaxone 12/18 >> Doxycycline 12/18 >>   Objective: Vitals:   07/26/20 0208 07/26/20 0300 07/26/20 0400 07/26/20 0500  BP:  (!) 175/97 (!) 181/82 (!) 176/87  Pulse:  97 99 (!) 102  Resp:  (!) 26 16 (!) 27  Temp:      TempSrc:      SpO2:  94% 94% (!) 84%  Weight: 66.5 kg     Height:        Intake/Output Summary (Last 24 hours) at 07/26/2020 0544 Last data filed at 07/26/2020 0300 Gross per 24 hour  Intake 2621.87 ml  Output 100 ml  Net 2521.87 ml   Filed Weights   07/25/20 1112 07/25/20 1705 07/26/20 0208  Weight: 69.7 kg 66.5 kg 66.5 kg    Examination: Constitutional: NAD Eyes: no scleral icterus ENMT: Mucous membranes are moist.  Neck: normal, supple Respiratory: clear to auscultation bilaterally, no wheezing, no crackles. Normal respiratory effort. No accessory muscle use.  Cardiovascular: Regular rate and rhythm, no murmurs / rubs / gallops. Trace edema Abdomen: non distended, no tenderness. Bowel sounds positive.  Musculoskeletal: no clubbing / cyanosis.  Skin: no rashes Neurologic: non focal   Data Reviewed: I have independently reviewed following labs and imaging studies   CBC: Recent Labs  Lab 07/25/20 0300 07/25/20 0840  WBC 10.8* 10.6*  NEUTROABS 9.5* 8.9*  HGB 8.6* 7.4*  HCT 27.2* 23.3*  MCV 69.2* 69.3*  PLT 241 267   Basic Metabolic Panel: Recent Labs  Lab 07/25/20 0300 07/25/20 0840 07/25/20 1113  NA 135 135  --   K 3.6 3.5  --   CL 99 101  --   CO2 18* 18*  --   GLUCOSE 219* 124*  --   BUN 101* 93*  --    CREATININE 6.87* 6.04*  --   CALCIUM 7.9* 7.9*  --   MG  --   --  2.0   GFR: Estimated Creatinine Clearance: 6.3 mL/min (A) (by C-G formula based on SCr of 6.04 mg/dL (H)). Liver Function Tests: Recent Labs  Lab 07/25/20 0300 07/25/20 0840  AST 34 35  ALT 16 15  ALKPHOS 57 53  BILITOT 0.5 0.3  PROT 7.9 7.7  ALBUMIN 2.8* 2.7*   No results for input(s): LIPASE, AMYLASE in the last 168 hours. No results for input(s): AMMONIA in the last 168 hours. Coagulation Profile: Recent Labs  Lab 07/25/20 1113  INR 1.2   Cardiac Enzymes: No results for input(s): CKTOTAL, CKMB, CKMBINDEX, TROPONINI in the last 168 hours. BNP (last 3 results) No results for input(s): PROBNP in the   last 8760 hours. HbA1C: No results for input(s): HGBA1C in the last 72 hours. CBG: Recent Labs  Lab 07/25/20 0756 07/25/20 0932 07/25/20 1159 07/25/20 1645 07/25/20 1955  GLUCAP 145* 105* 123* 210* 210*   Lipid Profile: Recent Labs    07/25/20 0840  TRIG 185*   Thyroid Function Tests: Recent Labs    07/25/20 0840  TSH 0.438   Anemia Panel: Recent Labs    07/25/20 0840  FERRITIN 2,497*   Urine analysis:    Component Value Date/Time   COLORURINE YELLOW 07/25/2020 [place] 07/25/2020 1747   LABSPEC 1.010 07/25/2020 1747   PHURINE 5.0 07/25/2020 1747   GLUCOSEU NEGATIVE 07/25/2020 1747   HGBUR SMALL (A) 07/25/2020 1747   BILIRUBINUR NEGATIVE 07/25/2020 1747   BILIRUBINUR negative 01/30/2020 [place] 07/25/2020 1747   PROTEINUR 100 (A) 07/25/2020 1747   UROBILINOGEN 0.2 01/30/2020 1652   UROBILINOGEN 0.2 05/25/2015 2112   NITRITE NEGATIVE 07/25/2020 1747   LEUKOCYTESUR NEGATIVE 07/25/2020 1747   Sepsis Labs: Invalid input(s): PROCALCITONIN, LACTICIDVEN  Recent Results (from the past 240 hour(s))  Resp Panel by RT-PCR (Flu A&B, Covid) Nasopharyngeal Swab     Status: Abnormal   Collection Time: 07/25/20  1:56 AM   Specimen: Nasopharyngeal Swab;  Nasopharyngeal(NP) swabs in vial transport medium  Result Value Ref Range Status   SARS Coronavirus 2 by RT PCR POSITIVE (A) NEGATIVE Final    Comment: CRITICAL RESULT CALLED TO, READ BACK BY AND VERIFIED WITH: Burnis Medin RN 07/25/20 _0  BY P.HENDERSON (NOTE) SARS-CoV-2 target nucleic acids are DETECTED.  The SARS-CoV-2 RNA is generally detectable in upper respiratory specimens during the acute phase of infection. Positive results are indicative of the presence of the identified virus, but do not rule out bacterial infection or co-infection with other pathogens not detected by the test. Clinical correlation with patient history and other diagnostic information is necessary to determine patient infection status. The expected result is Negative.  Fact Sheet for Patients: EntrepreneurPulse.com.au  Fact Sheet for Healthcare Providers: IncredibleEmployment.be  This test is not yet approved or cleared by the [place] FDA and  has been authorized for detection and/or diagnosis of SARS-CoV-2 by FDA under an Emergency Use Authorization (EUA).  This EUA will remain in effect (me aning this test can be used) for the duration of  the COVID-19 declaration under Section 564(b)(1) of the Act, 21 U.S.C. section 360bbb-3(b)(1), unless the authorization is terminated or revoked sooner.     Influenza A by PCR NEGATIVE NEGATIVE Final   Influenza B by PCR NEGATIVE NEGATIVE Final    Comment: (NOTE) The Xpert Xpress SARS-CoV-2/FLU/RSV plus assay is intended as an aid in the diagnosis of influenza from Nasopharyngeal swab specimens and should not be used as a sole basis for treatment. Nasal washings and aspirates are unacceptable for Xpert Xpress SARS-CoV-2/FLU/RSV testing.  Fact Sheet for Patients: EntrepreneurPulse.com.au  Fact Sheet for Healthcare Providers: IncredibleEmployment.be  This test is not yet approved  or cleared by the [place] FDA and has been authorized for detection and/or diagnosis of SARS-CoV-2 by FDA under an Emergency Use Authorization (EUA). This EUA will remain in effect (meaning this test can be used) for the duration of the COVID-19 declaration under Section 564(b)(1) of the Act, 21 U.S.C. section 360bbb-3(b)(1), unless the authorization is terminated or revoked.  Performed at [facility], [place] [address] [place], [place] [address]   Culture, blood (single)     Status: None (Preliminary  result)   Collection Time: 07/25/20  6:19 AM   Specimen: BLOOD  Result Value Ref Range Status   Specimen Description   Final    BLOOD RIGHT ANTECUBITAL Performed at [facility], [address] [place], [place] [address]    Special Requests   Final    BOTTLES DRAWN AEROBIC AND ANAEROBIC Blood Culture adequate volume Performed at [facility], [address] [place], [place] [address]    Culture   Final    NO GROWTH < 12 HOURS Performed at [facility], [address] [place], [place] [address]    Report Status PENDING  Incomplete  MRSA PCR Screening     Status: None   Collection Time: 07/25/20 11:58 AM   Specimen: Nasopharyngeal  Result Value Ref Range Status   MRSA by PCR NEGATIVE NEGATIVE Final    Comment:        The GeneXpert MRSA Assay (FDA approved for NASAL specimens only), is one component of a comprehensive MRSA colonization surveillance program. It is not intended to diagnose MRSA infection nor to guide or monitor treatment for MRSA infections. Performed at [facility], [address] [place], [place] [address]       Radiology Studies: DG Chest 2 View  Result Date: 07/25/2020 CLINICAL DATA:  COVID-19 EXAM: CHEST - 2 VIEW COMPARISON:  05/25/2015 FINDINGS: Multifocal peripheral predominant airspace opacity. No pleural effusion or pneumothorax. IMPRESSION: Multifocal  peripheral predominant airspace opacity, compatible with multifocal pneumonia. Electronically Signed   By: Kevin  Herman M.D.   On: 07/25/2020 02:48   US RENAL  Result Date: 07/25/2020 CLINICAL DATA:  Acute renal failure EXAM: RENAL / URINARY TRACT ULTRASOUND COMPLETE COMPARISON:  None. FINDINGS: Right Kidney: Renal measurements: 9.8 x 4.8 x 5.1 cm = volume: 126 mL. Diffusely increased renal cortical echogenicity. Mild renal pelviectasis. No calyceal dilatation to suggest frank hydronephrosis. No concerning renal mass or shadowing calculus is visible Left Kidney: Renal measurements: 10.4 x 5.5 x 5.4 cm = volume: 161 mL. Diffusely increased renal cortical echogenicity. Tiny echogenic, shadowing focus in the interpolar left kidney measuring up to 6 mm in size, could reflect a tiny nonobstructive calculus or Randall's plaque. No hydronephrosis or concerning mass. Bladder: Within normal limits for the degree of distention. Other: None. IMPRESSION: 1. Diffusely increased renal cortical echogenicity bilaterally compatible with medical renal disease. 2. Mild right renal pelviectasis without calyceal dilatation to suggest frank hydronephrosis. Could reflect physiologic distension or an extrarenal pelvis. 3. 6 mm echogenic, shadowing focus in the interpolar left kidney may reflect a tiny nonobstructive calculus or Randall's plaque. Electronically Signed   By: Price  DeHay M.D.   On: 07/25/2020 20:23     , MD, PhD [facility]  Between 7 am - 7 pm I am available, please contact me via Amion or Securechat  Between 7 pm - 7 am I am not available, please contact night coverage MD/APP via Amion   

## 2020-07-26 NOTE — Progress Notes (Signed)
[facility] Progress Note  Subjective:  Patient not examined today directly given COVID-19 + status, utilizing data taken from chart +/- discussions w/ providers and staff.   Per pmd pt is alert, no jerking or confusion or N/V.  Labs no better today.   Vitals:   07/26/20 0400 07/26/20 0500 07/26/20 0620 07/26/20 0830  BP: (!) 181/82 (!) 176/87 (!) 182/90   Pulse: 99 (!) 102    Resp: 16 (!) 27    Temp:    98.5 F (36.9 C)  TempSrc:    Oral  SpO2: 94% (!) 84%    Weight:      Height:        Exam:  alert, nad   no jvd  Chest cta bilat  Cor reg no RG  Abd soft ntnd no ascites   Ext no LE edema   Alert, NF, ox3       Home meds:  - norvasc 10/ clonidine 0.1 tid/ lasix 160 bid/ hydralazine 25 tid  - insulin detemir 30 am 15 hs  - asa 325/ lipitor 20  - prn's/ vitamins/ supplements     Date               Creat               eGFR   2008- 14        1.2- 1.6   2015              1.9- 2.3   2015- 19        2.3- 2.8   2020              2.54- 2.79        18- 20   01/30/20          3.09                 15   05/05/20          2.73                 18   08/04/20        6.04                 6    UA 12/18 - 100prot, o/w negative  CXR 12/18 - IMPRESSION: Multifocal peripheral predominant airspace opacity, compatible with multifocal pneumonia.   BP's normal to high , 4- 6 L [place], 94%  Assessment/ Plan: 1. AKI on CKD 4 - b/l creat 2.7- 3.0 from 2021, eGFR 15- 18 ml/min. BP's up, looked dry on initial exam. Got 2L bolus and getting IVF at 75 cc/hr. Creat 6.8 on admit > 6.6 today, BUN 96. UA negative, renal US w/o obstruction. Not sure cause of AKI, no hypotension/ acei/ ARB. Could be dehydration related to acute Covid infection, and/or poss renal COVID effects. Not grossly uremic today, but has CKD advanced stage IV, may end up needing HD this admission. Recommend put in for tx to [facility] in case needs HD in the next few days. Have d/w pmd. Cont IVF at 75/hr. Will follow.   2. COVID+ pna -  IV steroids , per primary team 3. Atrial fib w/ RVR - started on IV amio, IV diltiazem and IV heparin 4. DM2 - per pmd 5. Sepsis/ CAP - w/o hypotension, SP LR bolus 6. HTN - BP's remain high on home norvasc, will resume home clonidine today. Still has home hydralazine can be resumed if  needed.  7. Anemia d/t chronic disease - Hb 8.6 > 7.4     IMPRESSION: 1. Diffusely increased renal cortical echogenicity bilaterally compatible with medical renal disease. 2. Mild right renal pelviectasis without calyceal dilatation to suggest frank hydronephrosis. Could reflect physiologic distension or an extrarenal pelvis.     Rob Kensington Duerst 07/26/2020, 9:41 AM   Recent Labs  Lab 07/25/20 0840 07/26/20 0741  K 3.5 3.9  BUN 93* 96*  CREATININE 6.04* 6.63*  CALCIUM 7.9* 8.6*  HGB 7.4* 8.5*   Inpatient medications: . amiodarone  150 mg Intravenous Once  . amLODipine  10 mg Oral Daily  . atorvastatin  20 mg Oral q1800  . chlorhexidine  15 mL Mouth Rinse BID  . Chlorhexidine Gluconate Cloth  6 each Topical Daily  . insulin aspart  0-9 Units Subcutaneous TID WC  . insulin detemir  0.075 Units/kg Subcutaneous BID  . Ipratropium-Albuterol  1 puff Inhalation BID  . linagliptin  5 mg Oral Daily  . mouth rinse  15 mL Mouth Rinse BID  . mouth rinse  15 mL Mouth Rinse q12n4p  . methylPREDNISolone (SOLU-MEDROL) injection  0.5 mg/kg Intravenous Q12H   Followed by  . [START ON 07/28/2020] predniSONE  50 mg Oral Daily  . sodium chloride flush  3 mL Intravenous Q12H   . sodium chloride 75 mL/hr at 07/26/20 0619  . amiodarone Stopped (07/25/20 1940)  . cefTRIAXone (ROCEPHIN)  IV    . diltiazem (CARDIZEM) infusion 5 mg/hr (07/26/20 2050)  . doxycycline (VIBRAMYCIN) IV    . heparin 900 Units/hr (07/26/20 0412)   acetaminophen, dextrose, guaiFENesin-dextromethorphan

## 2020-07-26 NOTE — Progress Notes (Signed)
Pharmacy: Re-Heparin  Patient's an 84 y.o F with COVID-19 currently on heparin drip for afib.  - heparin level remains supra-therapeutic at 0.96 despite rate decreased to 750 units/hr earlier today.She's probably accumulating drug d/t renal insuff. - per pt's RN, no issue with IV line and no bleeding noted - AKI on CKD, CrCl <10  Goal of Therapy:  Heparin level 0.3-0.7 units/ml Monitor platelets by anticoagulation protocol: Yes    Plan: - Decrease heparin drip to 550 units/hr - check 8 hr heparin level - monitor for s/sx bleeding  Dia Sitter, PharmD, BCPS 07/26/2020 7:47 PM

## 2020-07-26 NOTE — Progress Notes (Signed)
ANTICOAGULATION CONSULT NOTE -   Pharmacy Consult for Heparin Indication: atrial fibrillation  Allergies  Allergen Reactions  . Penicillins Swelling and Rash    Patient Measurements: Height: 5\' 1"  (154.9 cm) Weight: 66.5 kg (146 lb 9.7 oz) IBW/kg (Calculated) : 47.8 Heparin Dosing Weight: 60 kg  Vital Signs: Temp: 98.5 F (36.9 C) (12/19 0830) Temp Source: Oral (12/19 0830) BP: 182/90 (12/19 0620) Pulse Rate: 102 (12/19 0500)  Labs: Recent Labs    07/25/20 0300 07/25/20 0840 07/25/20 1113 07/25/20 2100 07/26/20 0741  HGB 8.6* 7.4*  --   --  8.5*  HCT 27.2* 23.3*  --   --  26.7*  PLT 241 267  --   --  312  APTT  --   --  32  --   --   LABPROT  --   --  15.2  --   --   INR  --   --  1.2  --   --   HEPARINUNFRC  --   --   --  0.69 0.86*  CREATININE 6.87* 6.04*  --   --  6.63*    Estimated Creatinine Clearance: 5.7 mL/min (A) (by C-G formula based on SCr of 6.63 mg/dL (H)).   Medical History: Past Medical History:  Diagnosis Date  . Arthritis    RA  . CHF (congestive heart failure) ([place])   . CKD (chronic kidney disease) stage 3, GFR 30-59 ml/min (HCC)   . Diabetes mellitus without complication (HCC)    insulin dependent  . Hyperlipidemia   . Hypertension   . Shortness of breath dyspnea   . Sleep apnea    cpap  . TIA (transient ischemic attack) 2015    Medications:  Scheduled:  . amiodarone  150 mg Intravenous Once  . amLODipine  10 mg Oral Daily  . atorvastatin  20 mg Oral q1800  . chlorhexidine  15 mL Mouth Rinse BID  . Chlorhexidine Gluconate Cloth  6 each Topical Daily  . cloNIDine  0.1 mg Oral TID  . insulin aspart  0-9 Units Subcutaneous TID WC  . insulin detemir  0.075 Units/kg Subcutaneous BID  . Ipratropium-Albuterol  1 puff Inhalation BID  . linagliptin  5 mg Oral Daily  . mouth rinse  15 mL Mouth Rinse BID  . mouth rinse  15 mL Mouth Rinse q12n4p  . methylPREDNISolone (SOLU-MEDROL) injection  0.5 mg/kg Intravenous Q12H   Followed by   . [START ON 07/28/2020] predniSONE  50 mg Oral Daily  . sodium chloride flush  3 mL Intravenous Q12H   Infusions:  . sodium chloride 75 mL/hr at 07/26/20 0619  . amiodarone Stopped (07/25/20 1940)  . cefTRIAXone (ROCEPHIN)  IV    . diltiazem (CARDIZEM) infusion 5 mg/hr (07/26/20 9371)  . doxycycline (VIBRAMYCIN) IV    . heparin 900 Units/hr (07/26/20 0412)   PRN: acetaminophen, dextrose, guaiFENesin-dextromethorphan  Assessment: 84 yo female presents with  cough, lethargy and confusion found to be +COVID.  Now in afib, starting on diltiazem drip and pharmacy consulted to dose IV heparin.   No anticoagulants PTA, +aspirin 325mg  daily  07/26/20 10:21 AM   HL 0.86 above goal  No bleeding or infusion-related issues per RN  CBC stable  Goal of Therapy:  Heparin level 0.3-0.7 units/ml Monitor platelets by anticoagulation protocol: Yes   Plan:  Reduce heparin infusion to 750 units/hr Check heparin level in 8hrs Monitor closely for signs/symptoms of bleeding  Napoleon Form  07/26/2020, 10:16 AM

## 2020-07-27 DIAGNOSIS — U071 COVID-19: Secondary | ICD-10-CM | POA: Diagnosis not present

## 2020-07-27 DIAGNOSIS — I4891 Unspecified atrial fibrillation: Secondary | ICD-10-CM | POA: Diagnosis not present

## 2020-07-27 DIAGNOSIS — N179 Acute kidney failure, unspecified: Secondary | ICD-10-CM | POA: Diagnosis not present

## 2020-07-27 DIAGNOSIS — J9601 Acute respiratory failure with hypoxia: Secondary | ICD-10-CM | POA: Diagnosis not present

## 2020-07-27 LAB — GLUCOSE, CAPILLARY
Glucose-Capillary: 188 mg/dL — ABNORMAL HIGH (ref 70–99)
Glucose-Capillary: 176 mg/dL — ABNORMAL HIGH (ref 70–99)
Glucose-Capillary: 188 mg/dL — ABNORMAL HIGH (ref 70–99)

## 2020-07-27 LAB — COMPREHENSIVE METABOLIC PANEL
ALT: 19 U/L (ref 0–44)
AST: 37 U/L (ref 15–41)
Albumin: 2.8 g/dL — ABNORMAL LOW (ref 3.5–5.0)
Alkaline Phosphatase: 60 U/L (ref 38–126)
Anion gap: 18 — ABNORMAL HIGH (ref 5–15)
BUN: 99 mg/dL — ABNORMAL HIGH (ref 8–23)
CO2: 16 mmol/L — ABNORMAL LOW (ref 22–32)
Calcium: 8.7 mg/dL — ABNORMAL LOW (ref 8.9–10.3)
Chloride: 102 mmol/L (ref 98–111)
Creatinine, Ser: 6.95 mg/dL — ABNORMAL HIGH (ref 0.44–1.00)
GFR, Estimated: 5 mL/min — ABNORMAL LOW (ref >60)
Glucose, Bld: 222 mg/dL — ABNORMAL HIGH (ref 70–99)
Potassium: 3.8 mmol/L (ref 3.5–5.1)
Sodium: 136 mmol/L (ref 135–145)
Total Bilirubin: 0.5 mg/dL (ref 0.3–1.2)
Total Protein: 7.9 g/dL (ref 6.5–8.1)

## 2020-07-27 LAB — D-DIMER, QUANTITATIVE: D-Dimer, Quant: 1.29 ug/mL-FEU — ABNORMAL HIGH (ref 0.00–0.50)

## 2020-07-27 LAB — CBC WITH DIFFERENTIAL/PLATELET
Abs Immature Granulocytes: 0.23 10*3/uL — ABNORMAL HIGH (ref 0.00–0.07)
Basophils Absolute: 0 10*3/uL (ref 0.0–0.1)
Basophils Relative: 0 %
Eosinophils Absolute: 0 10*3/uL (ref 0.0–0.5)
Eosinophils Relative: 0 %
HCT: 27.5 % — ABNORMAL LOW (ref 36.0–46.0)
Hemoglobin: 8.6 g/dL — ABNORMAL LOW (ref 12.0–15.0)
Immature Granulocytes: 2 %
Lymphocytes Relative: 4 %
Lymphs Abs: 0.6 10*3/uL — ABNORMAL LOW (ref 0.7–4.0)
MCH: 21.9 pg — ABNORMAL LOW (ref 26.0–34.0)
MCHC: 31.3 g/dL (ref 30.0–36.0)
MCV: 70 fL — ABNORMAL LOW (ref 80.0–100.0)
Monocytes Absolute: 0.2 10*3/uL (ref 0.1–1.0)
Monocytes Relative: 2 %
Neutro Abs: 12 10*3/uL — ABNORMAL HIGH (ref 1.7–7.7)
Neutrophils Relative %: 92 %
Platelets: 402 10*3/uL — ABNORMAL HIGH (ref 150–400)
RBC: 3.93 MIL/uL (ref 3.87–5.11)
RDW: 15.2 % (ref 11.5–15.5)
WBC: 13 10*3/uL — ABNORMAL HIGH (ref 4.0–10.5)
nRBC: 0.2 % (ref 0.0–0.2)

## 2020-07-27 LAB — C-REACTIVE PROTEIN: CRP: 14.1 mg/dL — ABNORMAL HIGH (ref <1.0)

## 2020-07-27 LAB — HEPARIN LEVEL (UNFRACTIONATED): Heparin Unfractionated: 0.51 IU/mL (ref 0.30–0.70)

## 2020-07-27 LAB — FERRITIN: Ferritin: 2934 ng/mL — ABNORMAL HIGH (ref 11–307)

## 2020-07-27 MED ORDER — METHYLPREDNISOLONE SODIUM SUCC 40 MG IJ SOLR
40.0000 mg | Freq: Two times a day (BID) | INTRAMUSCULAR | Status: DC
  Administered 2020-07-27 – 2020-08-07 (×22): 40 mg via INTRAVENOUS
  Filled 2020-07-27 (×23): qty 1

## 2020-07-27 NOTE — Progress Notes (Signed)
[facility] Progress Note    Assessment/ Plan:   Assessment/ Plan: 1. AKI on CKD 4 - b/l creat 2.7- 3.0 from 2021, eGFR 15- 18 ml/min. BP's up, looked dry on initial exam. Got 2L bolus and getting IVF at 75 cc/hr. Creat 6.8 on admit > 6.6--> 6.95 today.  UA negative, renal US w/o obstruction. Will put stop date on fluids, NPO past MN tonight in anticipation of possible need for HD cath.   2. COVID+ pna -  IV steroids , per primary team 3. Atrial fib w/ RVR - started on IV amio,  and IV heparin 4. DM2 - per pmd 5. Sepsis/ CAP - w/o hypotension, SP LR bolus, on ceftriaxone and doxy 6. HTN - BP's remain high on home norvasc, home clonidine resumed.  7. Anemia d/t chronic disease - Hb 8.6 > 7.4 8. Dispo: pending   Subjective:    Seen and examined.  Transferred from [facility] to [facility] for possible need for dialysis.  Labs are essentially the same as yesterday.  Good UOP.  No jerking/ metallic taste/ n/v.  On 8 L O2   Objective:   BP 127/72 (BP Location: Right Arm)   Pulse 80   Temp 97.7 F (36.5 C) (Oral)   Resp 20   Ht '5\' 1"'  (1.549 m)   Wt 66.5 kg   SpO2 92%   BMI 27.70 kg/m   Intake/Output Summary (Last 24 hours) at 07/27/2020 1346 Last data filed at 07/27/2020 1048 Gross per 24 hour  Intake 3002.08 ml  Output 900 ml  Net 2102.08 ml   Weight change: -3.173 kg  Physical Exam: Gen: NAD, lying in bed, sleeping but arousable CVS: irregular Resp: coarse bilaterally Abd: soft Ext: no LE edema  Imaging: US RENAL  Result Date: 07/25/2020 CLINICAL DATA:  Acute renal failure EXAM: RENAL / URINARY TRACT ULTRASOUND COMPLETE COMPARISON:  None. FINDINGS: Right Kidney: Renal measurements: 9.8 x 4.8 x 5.1 cm = volume: 126 mL. Diffusely increased renal cortical echogenicity. Mild renal pelviectasis. No calyceal dilatation to suggest frank hydronephrosis. No concerning renal mass or shadowing calculus is visible Left Kidney: Renal measurements: 10.4 x 5.5 x 5.4 cm = volume:  161 mL. Diffusely increased renal cortical echogenicity. Tiny echogenic, shadowing focus in the interpolar left kidney measuring up to 6 mm in size, could reflect a tiny nonobstructive calculus or Randall's plaque. No hydronephrosis or concerning mass. Bladder: Within normal limits for the degree of distention. Other: None. IMPRESSION: 1. Diffusely increased renal cortical echogenicity bilaterally compatible with medical renal disease. 2. Mild right renal pelviectasis without calyceal dilatation to suggest frank hydronephrosis. Could reflect physiologic distension or an extrarenal pelvis. 3. 6 mm echogenic, shadowing focus in the interpolar left kidney may reflect a tiny nonobstructive calculus or Randall's plaque. Electronically Signed   By: Lovena Le M.D.   On: 07/25/2020 20:23    Labs: BMET Recent Labs  Lab 07/25/20 0300 07/25/20 0840 07/26/20 0741 07/27/20 0605  NA 135 135 132* 136  K 3.6 3.5 3.9 3.8  CL 99 101 99 102  CO2 18* 18* 13* 16*  GLUCOSE 219* 124* 312* 222*  BUN 101* 93* 96* 99*  CREATININE 6.87* 6.04* 6.63* 6.95*  CALCIUM 7.9* 7.9* 8.6* 8.7*   CBC Recent Labs  Lab 07/25/20 0300 07/25/20 0840 07/26/20 0741 07/27/20 0605  WBC 10.8* 10.6* 11.9* 13.0*  NEUTROABS 9.5* 8.9* 10.9* 12.0*  HGB 8.6* 7.4* 8.5* 8.6*  HCT 27.2* 23.3* 26.7* 27.5*  MCV 69.2* 69.3* 69.0*  70.0*  PLT 241 267 312 402*    Medications:    . amiodarone  150 mg Intravenous Once  . amLODipine  10 mg Oral Daily  . atorvastatin  20 mg Oral q1800  . chlorhexidine  15 mL Mouth Rinse BID  . Chlorhexidine Gluconate Cloth  6 each Topical Daily  . cloNIDine  0.1 mg Oral TID  . insulin aspart  0-9 Units Subcutaneous TID WC  . insulin detemir  0.075 Units/kg Subcutaneous BID  . Ipratropium-Albuterol  1 puff Inhalation BID  . linagliptin  5 mg Oral Daily  . mouth rinse  15 mL Mouth Rinse BID  . mouth rinse  15 mL Mouth Rinse q12n4p  . methylPREDNISolone (SOLU-MEDROL) injection  40 mg Intravenous Q12H   . sodium chloride flush  3 mL Intravenous Q12H      Madelon Lips, MD 07/27/2020, 1:46 PM

## 2020-07-27 NOTE — Progress Notes (Signed)
ANTICOAGULATION CONSULT NOTE -   Pharmacy Consult for Heparin Indication: atrial fibrillation  Allergies  Allergen Reactions  . Penicillins Swelling and Rash    Patient Measurements: Height: 5\' 1"  (154.9 cm) Weight: 66.5 kg (146 lb 9.7 oz) IBW/kg (Calculated) : 47.8 Heparin Dosing Weight: 60 kg  Vital Signs: Temp: 98.7 F (37.1 C) (12/20 0700) Temp Source: Oral (12/20 0700) BP: 156/64 (12/20 1000) Pulse Rate: 96 (12/20 1000)  Labs: Recent Labs    07/25/20 0840 07/25/20 1113 07/25/20 2100 07/26/20 0741 07/26/20 1831 07/27/20 0605 07/27/20 0808  HGB 7.4*  --   --  8.5*  --  8.6*  --   HCT 23.3*  --   --  26.7*  --  27.5*  --   PLT 267  --   --  312  --  402*  --   APTT  --  32  --   --   --   --   --   LABPROT  --  15.2  --   --   --   --   --   INR  --  1.2  --   --   --   --   --   HEPARINUNFRC  --   --    < > 0.86* 0.96*  --  0.51  CREATININE 6.04*  --   --  6.63*  --  6.95*  --    < > = values in this interval not displayed.    Estimated Creatinine Clearance: 5.4 mL/min (A) (by C-G formula based on SCr of 6.95 mg/dL (H)).  Assessment: 84 yo female presents with  cough, lethargy and confusion found to be +COVID.  Now in afib, starting on diltiazem drip and pharmacy consulted to dose IV heparin.   No anticoagulants PTA, +aspirin 325mg  daily  Heparin level now therapeutic  Goal of Therapy:  Heparin level 0.3-0.7 units/ml Monitor platelets by anticoagulation protocol: Yes   Plan:  Continue heparin at 550 units / hr Monitor closely for signs/symptoms of bleeding  Tad Moore  07/27/2020, 11:16 AM

## 2020-07-27 NOTE — Progress Notes (Signed)
PROGRESS NOTE  [name] [name]  WUJ:[id number] DOB: 1936-01-29 DOA: 07/25/2020 PCP: [name], MD   Brief Narrative: [name] [name] is an 84 year old female with history of RA, CKD 4, DM 2, HTN, HLD, OSA on CPAP, TIA, chronic diastolic CHF.  To the hospital with 5 days of cough, lethargy, confusion.  Was found to be hypoxic, tested positive for Covid and was admitted to the hospital.  She was also found to have acute kidney injury.  She was also found to have A. fib with RVR.  Cardiology and nephrology were consulted on admission. Renal function has not improved and the patient is transferred to [facility] in anticipation of dialysis initiation.  Assessment & Plan: Principal Problem:   Acute hypoxemic respiratory failure due to COVID-19 [name]) Active Problems:   Essential hypertension   HLD (hyperlipidemia)   DM (diabetes mellitus), type 2 with renal complications (HCC)   Chronic diastolic HF (heart failure) (HCC)   CAP (community acquired pneumonia)   CKD (chronic kidney disease), stage IV (HCC)   AKI (acute kidney injury) ([place])   Sepsis ([place])   Atrial fibrillation with RVR ([place])  Acute hypoxemic respiratory failure due to covid-19 pneumonia and suspected bacterial superimposed pneumonia: Multifocal peripheral-predominant infiltrates on CXR which I personally reviewed this morning - Continue steroids - Avoiding remdesivir with CrCl <30m/min, also avoiding baricitinib for this reason and concern for concomitant bacterial infection.  - Encourage OOB, IS, FV, and awake proning if able - Continue airborne, contact precautions for 21 days from positive testing. - Monitor CMP and inflammatory markers (improving)  Sepsis due to COVID-19 and suspected CAP -Met sepsis criteria with tachycardia, tachypnea, leukocytosis -Cultures obtained, placed on antibiotics, continue  Acute kidney injury on chronic kidney disease stage IV -Baseline creatinine around 2.4-2.7, creatinine 6.8  on presentation. Not improving. - [facility] - Continue low rate IVF, does not currently appear overloaded.  Anion gap metabolic acidosis -In the setting of renal failure  A. fib with RVR -Appreciate cardiology consult, patient started on amiodarone as she did not respond to diltiazem infusion.   - Continue heparin infusion given elevated CHADSVASc.  Acute metabolic encephalopathy, secondary to COVID-19 and uremia -Monitor mental status  Essential hypertension - Continue amlodipine. BP improving.  Chronic diastolic CHF -Appears euvolemic, no clear evidence of fluid overload -BNP slightly elevated on admission at 259.  Monitor  History of TIA -Hold aspirin in the setting of heparin infusion, continue statin  DM2 with steroid-induced hyperglycemia -Continue Levemir, sliding scale, Tradjenta  DVT prophylaxis: Heparin infusion Code Status: Full Family Communication: None Disposition Plan:  Status is: Inpatient  Remains inpatient appropriate because:Persistent severe electrolyte disturbances and Inpatient level of care appropriate due to severity of illness   Dispo: The patient is from: Home              Anticipated d/c is to: Home              Anticipated d/c date is: > 3 days              Patient currently is not medically stable to d/c.  Consultants:   Nephrology  Cardiology  Procedures:   None  Antimicrobials:  None   Subjective: Feels breathing is improved, no chest pain or other complaints. Denied bleeding. Eating and drinking some. Is not sure that she has covid.  Objective: Vitals:   07/27/20 0700 07/27/20 0800 07/27/20 1000 07/27/20 1151  BP: (!) 130/53 (!) 129/59 (!) 156/64 127/72  Pulse: 69 72 96 80  Resp: (!) 21 20 (!) 23 20  Temp: 98.7 F (37.1 C)   97.7 F (36.5 C)  TempSrc: Oral   Oral  SpO2: 100% 100% 92% 92%  Weight:      Height:        Intake/Output Summary (Last 24 hours) at 07/27/2020 1217 Last data  filed at 07/27/2020 1048 Gross per 24 hour  Intake 3002.08 ml  Output 900 ml  Net 2102.08 ml   Filed Weights   07/25/20 1705 07/26/20 0208 07/26/20 0944  Weight: 66.5 kg 66.5 kg 66.5 kg    Gen: 84 y.o. female in no distress  Pulm: Non-labored breathing. Clear to auscultation bilaterally.  CV: Regular rate and rhythm. No murmur, rub, or gallop. No JVD,  trace pitting pedal edema. GI: Abdomen soft, non-tender, non-distended, with normoactive bowel sounds. No organomegaly or masses felt. Ext: Warm, no deformities Skin: No rashes, lesions or ulcers on visualized skin Neuro: Alert and oriented. No focal neurological deficits. Psych: Judgement and insight appear normal. Mood & affect appropriate.   Data Reviewed: I have personally reviewed following labs and imaging studies  CBC: Recent Labs  Lab 07/25/20 0300 07/25/20 0840 07/26/20 0741 07/27/20 0605  WBC 10.8* 10.6* 11.9* 13.0*  NEUTROABS 9.5* 8.9* 10.9* 12.0*  HGB 8.6* 7.4* 8.5* 8.6*  HCT 27.2* 23.3* 26.7* 27.5*  MCV 69.2* 69.3* 69.0* 70.0*  PLT 241 267 312 003*   Basic Metabolic Panel: Recent Labs  Lab 07/25/20 0300 07/25/20 0840 07/25/20 1113 07/26/20 0741 07/27/20 0605  NA 135 135  --  132* 136  K 3.6 3.5  --  3.9 3.8  CL 99 101  --  99 102  CO2 18* 18*  --  13* 16*  GLUCOSE 219* 124*  --  312* 222*  BUN 101* 93*  --  96* 99*  CREATININE 6.87* 6.04*  --  6.63* 6.95*  CALCIUM 7.9* 7.9*  --  8.6* 8.7*  MG  --   --  2.0  --   --    GFR: Estimated Creatinine Clearance: 5.4 mL/min (A) (by C-G formula based on SCr of 6.95 mg/dL (H)). Liver Function Tests: Recent Labs  Lab 07/25/20 0300 07/25/20 0840 07/26/20 0741 07/27/20 0605  AST 34 35 40 37  ALT '16 15 19 19  ' ALKPHOS 57 53 60 60  BILITOT 0.5 0.3 0.8 0.5  PROT 7.9 7.7 8.0 7.9  ALBUMIN 2.8* 2.7* 2.8* 2.8*   No results for input(s): LIPASE, AMYLASE in the last 168 hours. No results for input(s): AMMONIA in the last 168 hours. Coagulation  Profile: Recent Labs  Lab 07/25/20 1113  INR 1.2   Cardiac Enzymes: No results for input(s): CKTOTAL, CKMB, CKMBINDEX, TROPONINI in the last 168 hours. BNP (last 3 results) No results for input(s): PROBNP in the last 8760 hours. HbA1C: No results for input(s): HGBA1C in the last 72 hours. CBG: Recent Labs  Lab 07/26/20 0841 07/26/20 1139 07/26/20 1609 07/26/20 2223 07/27/20 1205  GLUCAP 285* 247* 177* 158* 188*   Lipid Profile: Recent Labs    07/25/20 0840  TRIG 185*   Thyroid Function Tests: Recent Labs    07/25/20 0840  TSH 0.438   Anemia Panel: Recent Labs    07/26/20 0741 07/27/20 0605  FERRITIN 1,227* 2,934*   Urine analysis:    Component Value Date/Time   COLORURINE YELLOW 07/25/2020 1747   APPEARANCEUR CLEAR 07/25/2020 1747   LABSPEC 1.010 07/25/2020 1747   PHURINE 5.0  07/25/2020 [place] 07/25/2020 1747   HGBUR SMALL (A) 07/25/2020 1747   BILIRUBINUR NEGATIVE 07/25/2020 1747   BILIRUBINUR negative 01/30/2020 [place] 07/25/2020 1747   PROTEINUR 100 (A) 07/25/2020 1747   UROBILINOGEN 0.2 01/30/2020 1652   UROBILINOGEN 0.2 05/25/2015 2112   NITRITE NEGATIVE 07/25/2020 1747   LEUKOCYTESUR NEGATIVE 07/25/2020 1747   Recent Results (from the past 240 hour(s))  Resp Panel by RT-PCR (Flu A&B, Covid) Nasopharyngeal Swab     Status: Abnormal   Collection Time: 07/25/20  1:56 AM   Specimen: Nasopharyngeal Swab; Nasopharyngeal(NP) swabs in vial transport medium  Result Value Ref Range Status   SARS Coronavirus 2 by RT PCR POSITIVE (A) NEGATIVE Final    Comment: CRITICAL RESULT CALLED TO, READ BACK BY AND VERIFIED WITH: Burnis Medin RN 07/25/20 '@0603'  BY P.HENDERSON (NOTE) SARS-CoV-2 target nucleic acids are DETECTED.  The SARS-CoV-2 RNA is generally detectable in upper respiratory specimens during the acute phase of infection. Positive results are indicative of the presence of the identified virus, but do not  rule out bacterial infection or co-infection with other pathogens not detected by the test. Clinical correlation with patient history and other diagnostic information is necessary to determine patient infection status. The expected result is Negative.  Fact Sheet for Patients: EntrepreneurPulse.com.au  Fact Sheet for Healthcare Providers: IncredibleEmployment.be  This test is not yet approved or cleared by the [place] FDA and  has been authorized for detection and/or diagnosis of SARS-CoV-2 by FDA under an Emergency Use Authorization (EUA).  This EUA will remain in effect (me aning this test can be used) for the duration of  the COVID-19 declaration under Section 564(b)(1) of the Act, 21 U.S.C. section 360bbb-3(b)(1), unless the authorization is terminated or revoked sooner.     Influenza A by PCR NEGATIVE NEGATIVE Final   Influenza B by PCR NEGATIVE NEGATIVE Final    Comment: (NOTE) The Xpert Xpress SARS-CoV-2/FLU/RSV plus assay is intended as an aid in the diagnosis of influenza from Nasopharyngeal swab specimens and should not be used as a sole basis for treatment. Nasal washings and aspirates are unacceptable for Xpert Xpress SARS-CoV-2/FLU/RSV testing.  Fact Sheet for Patients: EntrepreneurPulse.com.au  Fact Sheet for Healthcare Providers: IncredibleEmployment.be  This test is not yet approved or cleared by the [place] FDA and has been authorized for detection and/or diagnosis of SARS-CoV-2 by FDA under an Emergency Use Authorization (EUA). This EUA will remain in effect (meaning this test can be used) for the duration of the COVID-19 declaration under Section 564(b)(1) of the Act, 21 U.S.C. section 360bbb-3(b)(1), unless the authorization is terminated or revoked.  Performed at [facility], [place] [address] [place], [place] [address]   Culture, blood (single)      Status: None (Preliminary result)   Collection Time: 07/25/20  6:19 AM   Specimen: BLOOD  Result Value Ref Range Status   Specimen Description   Final    BLOOD RIGHT ANTECUBITAL Performed at [place] [address] [place], [place] [address]    Special Requests   Final    BOTTLES DRAWN AEROBIC AND ANAEROBIC Blood Culture adequate volume Performed at [facility] [place] [address] [place], [place] [address]    Culture   Final    NO GROWTH 2 DAYS Performed at [facility] [address] [place], [place] [address]    Report Status PENDING  Incomplete  MRSA PCR Screening  Status: None   Collection Time: 07/25/20 11:58 AM   Specimen: Nasopharyngeal  Result Value Ref Range Status   MRSA by PCR NEGATIVE NEGATIVE Final    Comment:        The GeneXpert MRSA Assay (FDA approved for NASAL specimens only), is one component of a comprehensive MRSA colonization surveillance program. It is not intended to diagnose MRSA infection nor to guide or monitor treatment for MRSA infections. Performed at [facility], [place] [address], [place], [place] [address]   Blood Culture (routine x 2)     Status: None (Preliminary result)   Collection Time: 07/25/20  4:18 PM   Specimen: BLOOD  Result Value Ref Range Status   Specimen Description   Final    BLOOD RIGHT HAND Performed at [place] [address] [place], [place] [address]    Special Requests   Final    BOTTLES DRAWN AEROBIC ONLY Blood Culture results may not be optimal due to an inadequate volume of blood received in culture bottles Performed at [facility] [place] [address] [place], [place] [address]    Culture   Final    NO GROWTH 2 DAYS Performed at [place] [address], [place], [place] [address]    Report Status PENDING  Incomplete  Blood Culture (routine x 2)     Status: None (Preliminary result)    Collection Time: 07/25/20  4:18 PM   Specimen: BLOOD  Result Value Ref Range Status   Specimen Description   Final    BLOOD RIGHT HAND Performed at [place] [address] [place], [place] [address]    Special Requests   Final    BOTTLES DRAWN AEROBIC ONLY Blood Culture adequate volume Performed at [place] [address], [place], [place] [address]    Culture   Final    NO GROWTH 2 DAYS Performed at [facility] [address], [place], [place] [address]    Report Status PENDING  Incomplete      Radiology Studies: US RENAL  Result Date: 07/25/2020 CLINICAL DATA:  Acute renal failure EXAM: RENAL / URINARY TRACT ULTRASOUND COMPLETE COMPARISON:  None. FINDINGS: Right Kidney: Renal measurements: 9.8 x 4.8 x 5.1 cm = volume: 126 mL. Diffusely increased renal cortical echogenicity. Mild renal pelviectasis. No calyceal dilatation to suggest frank hydronephrosis. No concerning renal mass or shadowing calculus is visible Left Kidney: Renal measurements: 10.4 x 5.5 x 5.4 cm = volume: 161 mL. Diffusely increased renal cortical echogenicity. Tiny echogenic, shadowing focus in the interpolar left kidney measuring up to 6 mm in size, could reflect a tiny nonobstructive calculus or Randall's plaque. No hydronephrosis or concerning mass. Bladder: Within normal limits for the degree of distention. Other: None. IMPRESSION: 1. Diffusely increased renal cortical echogenicity bilaterally compatible with medical renal disease. 2. Mild right renal pelviectasis without calyceal dilatation to suggest frank hydronephrosis. Could reflect physiologic distension or an extrarenal pelvis. 3. 6 mm echogenic, shadowing focus in the interpolar left kidney may reflect a tiny nonobstructive calculus or Randall's plaque. Electronically Signed   By: Lovena Le M.D.   On: 07/25/2020 20:23    Scheduled Meds: . amiodarone  150 mg Intravenous Once  . amLODipine  10 mg Oral  Daily  . atorvastatin  20 mg Oral q1800  . chlorhexidine  15 mL Mouth Rinse BID  . Chlorhexidine Gluconate Cloth  6 each Topical Daily  . cloNIDine  0.1 mg Oral TID  . insulin aspart  0-9 Units Subcutaneous TID WC  . insulin detemir  0.075 Units/kg Subcutaneous BID  . Ipratropium-Albuterol  1 puff Inhalation BID  . linagliptin  5 mg Oral Daily  . mouth rinse  15 mL Mouth Rinse BID  . mouth rinse  15 mL Mouth Rinse q12n4p  . methylPREDNISolone (SOLU-MEDROL) injection  40 mg Intravenous Q12H  . sodium chloride flush  3 mL Intravenous Q12H   Continuous Infusions: . sodium chloride 75 mL/hr at 07/27/20 1212  . amiodarone Stopped (07/25/20 1940)  . cefTRIAXone (ROCEPHIN)  IV Stopped (07/27/20 1035)  . diltiazem (CARDIZEM) infusion Stopped (07/27/20 0233)  . doxycycline (VIBRAMYCIN) IV Stopped (07/27/20 1213)  . heparin 550 Units/hr (07/27/20 0240)     LOS: 2 days   Time spent: 35 minutes.  Patrecia Pour, MD [facility] [email or web address] 07/27/2020, 12:17 PM

## 2020-07-28 DIAGNOSIS — J9601 Acute respiratory failure with hypoxia: Secondary | ICD-10-CM | POA: Diagnosis not present

## 2020-07-28 DIAGNOSIS — R059 Cough, unspecified: Secondary | ICD-10-CM

## 2020-07-28 DIAGNOSIS — U071 COVID-19: Secondary | ICD-10-CM | POA: Diagnosis not present

## 2020-07-28 DIAGNOSIS — N179 Acute kidney failure, unspecified: Secondary | ICD-10-CM | POA: Diagnosis not present

## 2020-07-28 DIAGNOSIS — I4891 Unspecified atrial fibrillation: Secondary | ICD-10-CM | POA: Diagnosis not present

## 2020-07-28 DIAGNOSIS — E1121 Type 2 diabetes mellitus with diabetic nephropathy: Secondary | ICD-10-CM

## 2020-07-28 DIAGNOSIS — E7800 Pure hypercholesterolemia, unspecified: Secondary | ICD-10-CM

## 2020-07-28 LAB — CBC WITH DIFFERENTIAL/PLATELET
Abs Immature Granulocytes: 0.11 10*3/uL — ABNORMAL HIGH (ref 0.00–0.07)
Basophils Absolute: 0 10*3/uL (ref 0.0–0.1)
Basophils Relative: 0 %
Eosinophils Absolute: 0 10*3/uL (ref 0.0–0.5)
Eosinophils Relative: 0 %
HCT: 25.9 % — ABNORMAL LOW (ref 36.0–46.0)
Hemoglobin: 8.2 g/dL — ABNORMAL LOW (ref 12.0–15.0)
Immature Granulocytes: 1 %
Lymphocytes Relative: 4 %
Lymphs Abs: 0.4 10*3/uL — ABNORMAL LOW (ref 0.7–4.0)
MCH: 21.8 pg — ABNORMAL LOW (ref 26.0–34.0)
MCHC: 31.7 g/dL (ref 30.0–36.0)
MCV: 68.7 fL — ABNORMAL LOW (ref 80.0–100.0)
Monocytes Absolute: 0.2 10*3/uL (ref 0.1–1.0)
Monocytes Relative: 2 %
Neutro Abs: 9 10*3/uL — ABNORMAL HIGH (ref 1.7–7.7)
Neutrophils Relative %: 93 %
Platelets: 383 10*3/uL (ref 150–400)
RBC: 3.77 MIL/uL — ABNORMAL LOW (ref 3.87–5.11)
RDW: 15.5 % (ref 11.5–15.5)
WBC: 9.6 10*3/uL (ref 4.0–10.5)
nRBC: 0 % (ref 0.0–0.2)

## 2020-07-28 LAB — COMPREHENSIVE METABOLIC PANEL
ALT: 16 U/L (ref 0–44)
AST: 24 U/L (ref 15–41)
Albumin: 2.3 g/dL — ABNORMAL LOW (ref 3.5–5.0)
Alkaline Phosphatase: 55 U/L (ref 38–126)
Anion gap: 16 — ABNORMAL HIGH (ref 5–15)
BUN: 116 mg/dL — ABNORMAL HIGH (ref 8–23)
CO2: 15 mmol/L — ABNORMAL LOW (ref 22–32)
Calcium: 8.7 mg/dL — ABNORMAL LOW (ref 8.9–10.3)
Chloride: 104 mmol/L (ref 98–111)
Creatinine, Ser: 7.68 mg/dL — ABNORMAL HIGH (ref 0.44–1.00)
GFR, Estimated: 5 mL/min — ABNORMAL LOW (ref >60)
Glucose, Bld: 205 mg/dL — ABNORMAL HIGH (ref 70–99)
Potassium: 4.1 mmol/L (ref 3.5–5.1)
Sodium: 135 mmol/L (ref 135–145)
Total Bilirubin: 0.5 mg/dL (ref 0.3–1.2)
Total Protein: 6.7 g/dL (ref 6.5–8.1)

## 2020-07-28 LAB — PROCALCITONIN: Procalcitonin: 0.96 ng/mL

## 2020-07-28 LAB — GLUCOSE, CAPILLARY
Glucose-Capillary: 220 mg/dL — ABNORMAL HIGH (ref 70–99)
Glucose-Capillary: 248 mg/dL — ABNORMAL HIGH (ref 70–99)
Glucose-Capillary: 223 mg/dL — ABNORMAL HIGH (ref 70–99)
Glucose-Capillary: 201 mg/dL — ABNORMAL HIGH (ref 70–99)

## 2020-07-28 LAB — FERRITIN: Ferritin: 2391 ng/mL — ABNORMAL HIGH (ref 11–307)

## 2020-07-28 LAB — HEPARIN LEVEL (UNFRACTIONATED)
Heparin Unfractionated: <0.1 IU/mL — ABNORMAL LOW (ref 0.30–0.70)
Heparin Unfractionated: 0.21 IU/mL — ABNORMAL LOW (ref 0.30–0.70)

## 2020-07-28 LAB — C-REACTIVE PROTEIN: CRP: 9 mg/dL — ABNORMAL HIGH (ref <1.0)

## 2020-07-28 LAB — D-DIMER, QUANTITATIVE: D-Dimer, Quant: 0.98 ug/mL-FEU — ABNORMAL HIGH (ref 0.00–0.50)

## 2020-07-28 MED ORDER — SODIUM BICARBONATE 650 MG PO TABS
650.0000 mg | ORAL_TABLET | Freq: Three times a day (TID) | ORAL | Status: DC
  Administered 2020-07-28 – 2020-08-03 (×17): 650 mg via ORAL
  Filled 2020-07-28 (×17): qty 1

## 2020-07-28 NOTE — Progress Notes (Signed)
ANTICOAGULATION CONSULT NOTE -  Pharmacy Consult for Heparin Indication: atrial fibrillation  Allergies  Allergen Reactions  . Penicillins Swelling and Rash    Patient Measurements: Height: 5\' 1"  (154.9 cm) Weight: 68.6 kg (151 lb 3.8 oz) IBW/kg (Calculated) : 47.8 Heparin Dosing Weight: 60 kg  Vital Signs: Temp: 99.1 F (37.3 C) (12/21 1201) Temp Source: Axillary (12/21 1201) BP: 142/69 (12/21 1201) Pulse Rate: 87 (12/21 1201)  Labs: Recent Labs    07/26/20 0741 07/26/20 1831 07/27/20 0605 07/27/20 0808 07/28/20 0210 07/28/20 1445  HGB 8.5*  --  8.6*  --  8.2*  --   HCT 26.7*  --  27.5*  --  25.9*  --   PLT 312  --  402*  --  383  --   HEPARINUNFRC 0.86*   < >  --  0.51 <0.10* 0.21*  CREATININE 6.63*  --  6.95*  --  7.68*  --    < > = values in this interval not displayed.    Estimated Creatinine Clearance: 5 mL/min (A) (by C-G formula based on SCr of 7.68 mg/dL (H)).  Assessment 84 y.o. female with Afib for heparin  HL 0.21 (subtherapeutic) no documented bleeding noted.  Goal of Therapy:  Heparin level 0.3-0.7 units/ml Monitor platelets by anticoagulation protocol: Yes   Plan:  Increase Heparin 750 units/hr Check heparin level in 8 hours.   Alanda Slim, PharmD, [facility] Clinical Pharmacist Please see AMION for all Pharmacists' Contact Phone Numbers 07/28/2020, 3:53 PM

## 2020-07-28 NOTE — Progress Notes (Signed)
PROGRESS NOTE    [name] [name]  XKG:[id number] DOB: 04/15/1936 DOA: 07/25/2020 PCP: [name], MD     Brief Narrative:  [name] [name] is an 84 year old female with history of RA, CKD 4, DM 2, HTN, HLD, OSA on CPAP, TIA, chronic diastolic CHF.   To the hospital with 5 days of cough, lethargy, confusion. Was found to be hypoxic, tested positive for Covid and was admitted to the hospital. She was also found to have acute kidney injury. She was also found to have A. fib with RVR. Cardiology and nephrology were consulted on admission. Renal function has not improved and the patient is transferred to [facility] in anticipation of dialysis initiation.   Subjective: Afebrile overnight A/O x4,   Assessment & Plan: Covid vaccination; unvaccinated   Principal Problem:   Acute hypoxemic respiratory failure due to COVID-19 [facility]) Active Problems:   Essential hypertension   HLD (hyperlipidemia)   DM (diabetes mellitus), type 2 with renal complications (HCC)   Chronic diastolic HF (heart failure) ([place])   CAP (community acquired pneumonia)   CKD (chronic kidney disease), stage IV ([place])   AKI (acute kidney injury) ([place])   Sepsis ([place])   Atrial fibrillation with RVR ([place])  Cute respiratory failure with hypoxia/Covid pneumonia/suspected bacterial superinfection COVID-19 Labs  Recent Labs    07/26/20 0741 07/27/20 0605 07/28/20 0210  DDIMER 1.60* 1.29* 0.98*  FERRITIN 1,227* 2,934* 2,391*  CRP 22.2* 14.1* 9.0*    Lab Results  Component Value Date   SARSCOV2NAA POSITIVE (A) 07/25/2020  -Patient with CrCl<31ml/min therefore and eligible for Remdesivir or Baricitinib -In addition possible bacterial superinfection initial procalcitonin=Results for [name] [name], [name] [name] (MRN [id number]) as of 07/28/2020 19:45  Ref. Range 07/25/2020 08:40  Procalcitonin Latest Units: ng/mL 2.35  -Trend procalcitonin -Cultures pending -Complete 7-day course of antibiotics  Sepsis  pneumonia (Covid 19/suspected CAP) -On admission criteria for sepsis tachycardia, tachypnea, leukocytosis -Cultures obtained, placed on antibiotics, continue  Acute on CKD stage IV (baseline Cr 2.4-2.7) -On presentation Cr 6.8 not improving -Transferred to [facility] per nephrology. - Continue low rate IVF, does not currently appear overloaded. -Awaiting placement of Vas-Cath by IR  Anion gap metabolic acidosis -In the setting of renal failure  Chronic diastolic CHF -BNP on admission= 259 -Strict in and out -Daily weight   A. fib with RVR -Cardiology consulted -Continue amiodarone drip -12/21 currently NSR -Continue heparin drip  Essential HTN -See CHF  Acute metabolic encephalopathy -85/02 resolved answer questions appropriately follows all commands  Hx TIA -Hold aspirin in the setting of heparin infusion -Lipitor 20 mg daily -Lipid panel pending  DM type II uncontrolled with Hyperglycemia -Hemoglobin A1c pending -        DVT prophylaxis: Heparin drip Code Status: Full Family Communication:  Status is: Inpatient    Dispo: The patient is from:               Anticipated d/c is to: Home              Anticipated d/c date is: 12/25              Patient currently unstable       Consultants:  Nephrology Cardiology  Procedures/Significant Events:    I have personally reviewed and interpreted all radiology studies and my findings are as above.  VENTILATOR SETTINGS: HFNC 12/21 Flow; 8 L/min  SPO2; 93%   Cultures   Antimicrobials: Anti-infectives (From admission, onward)   Start     Ordered Stop  07/26/20 1000  cefTRIAXone (ROCEPHIN) 1 g in sodium chloride 0.9 % 100 mL IVPB        07/25/20 1138 07/30/20 0959   07/26/20 1000  doxycycline (VIBRAMYCIN) 100 mg in sodium chloride 0.9 % 250 mL IVPB        07/25/20 1138 07/27/20 2335   07/25/20 0500  levofloxacin (LEVAQUIN) IVPB 750 mg        07/25/20 0453 07/25/20 0824          Devices    LINES / TUBES:      Continuous Infusions: . amiodarone 30 mg/hr (07/28/20 0428)  . cefTRIAXone (ROCEPHIN)  IV 1 g (07/28/20 0859)  . diltiazem (CARDIZEM) infusion Stopped (07/27/20 0233)  . heparin 650 Units/hr (07/28/20 0532)     Objective: Vitals:   07/28/20 0324 07/28/20 0623 07/28/20 0738 07/28/20 1201  BP: 113/61 129/68 (!) 146/67 (!) 142/69  Pulse: 73 71 88 87  Resp: 20 20 20 19   Temp: 97.9 F (36.6 C) 97.8 F (36.6 C) 98.6 F (37 C) 99.1 F (37.3 C)  TempSrc: Axillary Oral Oral Axillary  SpO2: 93% 96% 99% 93%  Weight:      Height:        Intake/Output Summary (Last 24 hours) at 07/28/2020 1342 Last data filed at 07/28/2020 0960 Gross per 24 hour  Intake 167.58 ml  Output 200 ml  Net -32.42 ml   Filed Weights   07/26/20 0208 07/26/20 0944 07/27/20 1610  Weight: 66.5 kg 66.5 kg 68.6 kg    Examination:  General: A/O x4 No acute respiratory distress Eyes: negative scleral hemorrhage, negative anisocoria, negative icterus ENT: Negative Runny nose, negative gingival bleeding, Neck:  Negative scars, masses, torticollis, lymphadenopathy, JVD Lungs: Clear to auscultation bilaterally without wheezes or crackles Cardiovascular: Regular rate and rhythm without murmur gallop or rub normal S1 and S2 Abdomen: negative abdominal pain, nondistended, positive soft, bowel sounds, no rebound, no ascites, no appreciable mass Extremities: No significant cyanosis, clubbing, or edema bilateral lower extremities Skin: Negative rashes, lesions, ulcers Psychiatric:  Negative depression, negative anxiety, negative fatigue, negative mania  Central nervous system:  Cranial nerves II through XII intact, tongue/uvula midline, all extremities muscle strength 5/5, sensation intact throughout,  negative dysarthria, negative expressive aphasia, negative receptive aphasia.  .     Data Reviewed: Care during the described time interval was provided by me .  I  have reviewed this patient's available data, including medical history, events of note, physical examination, and all test results as part of my evaluation.  CBC: Recent Labs  Lab 07/25/20 0300 07/25/20 0840 07/26/20 0741 07/27/20 0605 07/28/20 0210  WBC 10.8* 10.6* 11.9* 13.0* 9.6  NEUTROABS 9.5* 8.9* 10.9* 12.0* 9.0*  HGB 8.6* 7.4* 8.5* 8.6* 8.2*  HCT 27.2* 23.3* 26.7* 27.5* 25.9*  MCV 69.2* 69.3* 69.0* 70.0* 68.7*  PLT 241 267 312 402* 454   Basic Metabolic Panel: Recent Labs  Lab 07/25/20 0300 07/25/20 0840 07/25/20 1113 07/26/20 0741 07/27/20 0605 07/28/20 0210  NA 135 135  --  132* 136 135  K 3.6 3.5  --  3.9 3.8 4.1  CL 99 101  --  99 102 104  CO2 18* 18*  --  13* 16* 15*  GLUCOSE 219* 124*  --  312* 222* 205*  BUN 101* 93*  --  96* 99* 116*  CREATININE 6.87* 6.04*  --  6.63* 6.95* 7.68*  CALCIUM 7.9* 7.9*  --  8.6* 8.7* 8.7*  MG  --   --  2.0  --   --   --    GFR: Estimated Creatinine Clearance: 5 mL/min (A) (by C-G formula based on SCr of 7.68 mg/dL (H)). Liver Function Tests: Recent Labs  Lab 07/25/20 0300 07/25/20 0840 07/26/20 0741 07/27/20 0605 07/28/20 0210  AST 34 35 40 37 24  ALT 16 15 19 19 16   ALKPHOS 57 53 60 60 55  BILITOT 0.5 0.3 0.8 0.5 0.5  PROT 7.9 7.7 8.0 7.9 6.7  ALBUMIN 2.8* 2.7* 2.8* 2.8* 2.3*   No results for input(s): LIPASE, AMYLASE in the last 168 hours. No results for input(s): AMMONIA in the last 168 hours. Coagulation Profile: Recent Labs  Lab 07/25/20 1113  INR 1.2   Cardiac Enzymes: No results for input(s): CKTOTAL, CKMB, CKMBINDEX, TROPONINI in the last 168 hours. BNP (last 3 results) No results for input(s): PROBNP in the last 8760 hours. HbA1C: No results for input(s): HGBA1C in the last 72 hours. CBG: Recent Labs  Lab 07/27/20 1205 07/27/20 1743 07/27/20 2109 07/28/20 0740 07/28/20 1200  GLUCAP 188* 176* 188* 220* 248*   Lipid Profile: No results for input(s): CHOL, HDL, LDLCALC, TRIG, CHOLHDL,  LDLDIRECT in the last 72 hours. Thyroid Function Tests: No results for input(s): TSH, T4TOTAL, FREET4, T3FREE, THYROIDAB in the last 72 hours. Anemia Panel: Recent Labs    07/27/20 0605 07/28/20 0210  FERRITIN 2,934* 2,391*   Sepsis Labs: Recent Labs  Lab 07/25/20 0300 07/25/20 0840  PROCALCITON  --  2.35  LATICACIDVEN 1.3  --     Recent Results (from the past 240 hour(s))  Resp Panel by RT-PCR (Flu A&B, Covid) Nasopharyngeal Swab     Status: Abnormal   Collection Time: 07/25/20  1:56 AM   Specimen: Nasopharyngeal Swab; Nasopharyngeal(NP) swabs in vial transport medium  Result Value Ref Range Status   SARS Coronavirus 2 by RT PCR POSITIVE (A) NEGATIVE Final    Comment: CRITICAL RESULT CALLED TO, READ BACK BY AND VERIFIED WITH: Burnis Medin RN 07/25/20 @0603  BY P.HENDERSON (NOTE) SARS-CoV-2 target nucleic acids are DETECTED.  The SARS-CoV-2 RNA is generally detectable in upper respiratory specimens during the acute phase of infection. Positive results are indicative of the presence of the identified virus, but do not rule out bacterial infection or co-infection with other pathogens not detected by the test. Clinical correlation with patient history and other diagnostic information is necessary to determine patient infection status. The expected result is Negative.  Fact Sheet for Patients: EntrepreneurPulse.com.au  Fact Sheet for Healthcare Providers: IncredibleEmployment.be  This test is not yet approved or cleared by the [place] FDA and  has been authorized for detection and/or diagnosis of SARS-CoV-2 by FDA under an Emergency Use Authorization (EUA).  This EUA will remain in effect (me aning this test can be used) for the duration of  the COVID-19 declaration under Section 564(b)(1) of the Act, 21 U.S.C. section 360bbb-3(b)(1), unless the authorization is terminated or revoked sooner.     Influenza A by PCR NEGATIVE  NEGATIVE Final   Influenza B by PCR NEGATIVE NEGATIVE Final    Comment: (NOTE) The Xpert Xpress SARS-CoV-2/FLU/RSV plus assay is intended as an aid in the diagnosis of influenza from Nasopharyngeal swab specimens and should not be used as a sole basis for treatment. Nasal washings and aspirates are unacceptable for Xpert Xpress SARS-CoV-2/FLU/RSV testing.  Fact Sheet for Patients: EntrepreneurPulse.com.au  Fact Sheet for Healthcare Providers: IncredibleEmployment.be  This test is not yet approved or cleared by the [place] FDA  and has been authorized for detection and/or diagnosis of SARS-CoV-2 by FDA under an Emergency Use Authorization (EUA). This EUA will remain in effect (meaning this test can be used) for the duration of the COVID-19 declaration under Section 564(b)(1) of the Act, 21 U.S.C. section 360bbb-3(b)(1), unless the authorization is terminated or revoked.  Performed at [facility], [place] [address], [place], [place] [address]   Culture, blood (single)     Status: None (Preliminary result)   Collection Time: 07/25/20  6:19 AM   Specimen: BLOOD  Result Value Ref Range Status   Specimen Description   Final    BLOOD RIGHT ANTECUBITAL Performed at [place] [address], [place], [place] [address]    Special Requests   Final    BOTTLES DRAWN AEROBIC AND ANAEROBIC Blood Culture adequate volume Performed at [place] [address] [place], [place] [address]    Culture   Final    NO GROWTH 3 DAYS Performed at [facility], [place] [address], [place], [place] [address]    Report Status PENDING  Incomplete  MRSA PCR Screening     Status: None   Collection Time: 07/25/20 11:58 AM   Specimen: Nasopharyngeal  Result Value Ref Range Status   MRSA by PCR NEGATIVE NEGATIVE Final    Comment:        The GeneXpert MRSA Assay (FDA approved for NASAL  specimens only), is one component of a comprehensive MRSA colonization surveillance program. It is not intended to diagnose MRSA infection nor to guide or monitor treatment for MRSA infections. Performed at [facility], [place] [address] [place], [place] [address]   Blood Culture (routine x 2)     Status: None (Preliminary result)   Collection Time: 07/25/20  4:18 PM   Specimen: BLOOD  Result Value Ref Range Status   Specimen Description   Final    BLOOD RIGHT HAND Performed at [place] [address], [place], [place] [address]    Special Requests   Final    BOTTLES DRAWN AEROBIC ONLY Blood Culture results may not be optimal due to an inadequate volume of blood received in culture bottles Performed at [place] [address] [place], [place] [address]    Culture   Final    NO GROWTH 3 DAYS Performed at [facility], [place] [address] [place], [place] [address]    Report Status PENDING  Incomplete  Blood Culture (routine x 2)     Status: None (Preliminary result)   Collection Time: 07/25/20  4:18 PM   Specimen: BLOOD  Result Value Ref Range Status   Specimen Description   Final    BLOOD RIGHT HAND Performed at [place] [facility] [address], [place], [place] [address]    Special Requests   Final    BOTTLES DRAWN AEROBIC ONLY Blood Culture adequate volume Performed at [place] [address], [place], [place] [address]    Culture   Final    NO GROWTH 3 DAYS Performed at [facility], [place] [address], [place], [place] [address]    Report Status PENDING  Incomplete         Radiology Studies: No results found.      Scheduled Meds: . amiodarone  150 mg Intravenous Once  . amLODipine  10 mg Oral Daily  . atorvastatin  20 mg Oral q1800  .  chlorhexidine  15 mL Mouth Rinse BID  . Chlorhexidine Gluconate Cloth  6 each Topical Daily  . cloNIDine  0.1  mg Oral TID  . insulin aspart  0-9 Units Subcutaneous TID WC  . insulin detemir  0.075 Units/kg Subcutaneous BID  . Ipratropium-Albuterol  1 puff Inhalation BID  . linagliptin  5 mg Oral Daily  . mouth rinse  15 mL Mouth Rinse BID  . mouth rinse  15 mL Mouth Rinse q12n4p  . methylPREDNISolone (SOLU-MEDROL) injection  40 mg Intravenous Q12H  . sodium chloride flush  3 mL Intravenous Q12H   Continuous Infusions: . amiodarone 30 mg/hr (07/28/20 0428)  . cefTRIAXone (ROCEPHIN)  IV 1 g (07/28/20 0859)  . diltiazem (CARDIZEM) infusion Stopped (07/27/20 0233)  . heparin 650 Units/hr (07/28/20 0532)     LOS: 3 days    Time spent:40 min    Evelise Reine, Geraldo Docker, MD [facility] Pager [phone]  If 7PM-7AM, please contact night-coverage [email or web address] Password [email or web address] 07/28/2020, 1:42 PM

## 2020-07-28 NOTE — Evaluation (Signed)
Physical Therapy Evaluation Patient Details Name: [name] [name] MRN: [id number] DOB: 1936/05/21 Today's Date: 07/28/2020   History of Present Illness  Pt is an 84 y.o. female admitted 07/25/20 with 5 days of cough, lethargy, confusion; found to be hypoxic and tested (+) COVID-19, also with AKI, afib with RVR, sepsis. PMH includes RA, CKD 4, DM2, HTN, OSA on CPAP, TIA, CHF.    Clinical Impression  Pt presents with an overall decrease in functional mobility secondary to above. PTA, pt independent, lives alone with multiple supportive children nearby able to provide assist. Initiated education re: current condition, O2 needs, activity recommendations, and importance of mobility. Today, pt able to initiate transfer and gait training, requiring UE support and min-modA to maintain balance. SpO2 90-93% on 6L O2 HFNC. Pt would benefit from continued acute PT services to maximize functional mobility and independence prior to d/c with SNF-level therapies. Spoke with daughter [name]) on phone who reports family hopeful to have pt return home with 24/7 support.    Follow Up Recommendations SNF;Supervision/Assistance - 24 hour (family hopeful to have pt return home with 24/7 assist)    Equipment Recommendations  TBD (potentially-Rolling walker with 5" wheels;3in1)   Recommendations for Other Services       Precautions / Restrictions Precautions Precautions: Fall Restrictions Weight Bearing Restrictions: No      Mobility  Bed Mobility Overal bed mobility: Modified Independent             General bed mobility comments: HOB slightly elevated, increased time and effort to scoot hips to EOB with cues to attend to task    Transfers Overall transfer level: Needs assistance Equipment used: None;1 person hand held assist Transfers: Sit to/from Stand Sit to Stand: Min assist         General transfer comment: Reliant on minA to assist trunk elevation and maintain stability with  HHA upon standing  Ambulation/Gait Ambulation/Gait assistance: Min assist;Mod assist Gait Distance (Feet): 12 Feet Assistive device: 1 person hand held assist Gait Pattern/deviations: Step-to pattern;Trunk flexed Gait velocity: Decreased   General Gait Details: Slow, unsteady gait in room with min-modA for stability, pt reaching for HHA or UE support on furniture; DOE 3/4, denies dizziness  Stairs            Wheelchair Mobility    Modified Rankin (Stroke Patients Only)       Balance Overall balance assessment: Needs assistance   Sitting balance-Leahy Scale: Fair Sitting balance - Comments: Prolonged sitting EOB without issue; difficulty reaching feet requiring assist to don socks     Standing balance-Leahy Scale: Fair Standing balance comment: Can static stand without UE support, unable to accept challenge                             Pertinent Vitals/Pain Pain Assessment: No/denies pain    Home Living Family/patient expects to be discharged to:: Private residence Living Arrangements: Alone Available Help at Discharge: Family;Available 24 hours/day Type of Home: Apartment Home Access: Level entry     Home Layout: One level Home Equipment: Cane - single point;Grab bars - tub/shower Additional Comments: Daughter [name]) and other children nearby, likely able to provide 24/7 assist per daughter    Prior Function Level of Independence: Independent         Comments: Pt reports mod indep with intermittent use of SPC     Hand Dominance        Extremity/Trunk Assessment  Upper Extremity Assessment Upper Extremity Assessment: Generalized weakness    Lower Extremity Assessment Lower Extremity Assessment: Generalized weakness       Communication   Communication: HOH  Cognition Arousal/Alertness: Awake/alert Behavior During Therapy: WFL for tasks assessed/performed Overall Cognitive Status: Impaired/Different from baseline Area of  Impairment: Attention;Memory;Following commands;Awareness;Problem solving                   Current Attention Level: Selective Memory: Decreased short-term memory Following Commands: Follows one step commands consistently;Follows one step commands with increased time   Awareness: Emergent Problem Solving: Slow processing;Decreased initiation;Requires verbal cues General Comments: At times appeared confused, but ultimately A&Ox4 and following commands appropriately; some difficulty problem solving call bell; apparent cognitive impairments likely exacerbated by [facility]      General Comments General comments (skin integrity, edema, etc.): SpO2 maintaining 90-93% on 6L O2 [place] at end of session (decreased from 9L), RN aware: HR 100    Exercises     Assessment/Plan    PT Assessment Patient needs continued PT services  PT Problem List Decreased strength;Decreased activity tolerance;Decreased balance;Decreased mobility;Decreased cognition;Decreased knowledge of use of DME;Cardiopulmonary status limiting activity       PT Treatment Interventions DME instruction;Gait training;Functional mobility training;Therapeutic activities;Therapeutic exercise;Balance training;Patient/family education;Cognitive remediation    PT Goals (Current goals can be found in the Care Plan section)  Acute Rehab PT Goals Patient Stated Goal: Pt agreeable to post-acute rehab; family reports they will likely plan to have pt return home with 24/7 family assist PT Goal Formulation: With patient/family Time For Goal Achievement: 08/11/20 Potential to Achieve Goals: Good    Frequency Min 3X/week   Barriers to discharge        Co-evaluation               AM-PAC PT "6 Clicks" Mobility  Outcome Measure Help needed turning from your back to your side while in a flat bed without using bedrails?: None Help needed moving from lying on your back to sitting on the side of a flat bed without using bedrails?:  None Help needed moving to and from a bed to a chair (including a wheelchair)?: A Little Help needed standing up from a chair using your arms (e.g., wheelchair or bedside chair)?: A Little Help needed to walk in hospital room?: A Lot Help needed climbing 3-5 steps with a railing? : A Lot 6 Click Score: 18    End of Session Equipment Utilized During Treatment: Oxygen Activity Tolerance: Patient tolerated treatment well;Patient limited by fatigue Patient left: in chair;with call bell/phone within reach;with chair alarm set Nurse Communication: Mobility status PT Visit Diagnosis: Other abnormalities of gait and mobility (R26.89);Muscle weakness (generalized) (M62.81)    Time: 9675-9163 PT Time Calculation (min) (ACUTE ONLY): 32 min   Charges:   PT Evaluation $PT Eval Moderate Complexity: 1 Mod PT Treatments $Therapeutic Activity: 8-22 mins       Mabeline Caras, PT, DPT Acute Rehabilitation Services  Pager [phone] Office [place] 07/28/2020, 2:00 PM

## 2020-07-28 NOTE — Progress Notes (Signed)
Patient ID: [name] [name], female   DOB: 07/25/1936, 84 y.o.   MRN: [id number] S: She feels well without complaints of nausea, vomiting, dysgeusia, anorexia, SOB, cough, or orthopnea. O:BP (!) 142/69 (BP Location: Left Arm)   Pulse 87   Temp 99.1 F (37.3 C) (Axillary)   Resp 19   Ht 5\' 1"  (1.549 m)   Wt 68.6 kg   SpO2 93%   BMI 28.58 kg/m   Intake/Output Summary (Last 24 hours) at 07/28/2020 1423 Last data filed at 07/28/2020 5631 Gross per 24 hour  Intake 167.58 ml  Output 200 ml  Net -32.42 ml   Intake/Output: I/O last 3 completed shifts: In: 1664.8 [I.V.:1205.3; IV Piggyback:459.6] Out: 700 [Urine:700]  Intake/Output this shift:  No intake/output data recorded. Weight change: 2.1 kg Gen: Sitting in chair in NAD CVS: RRR Resp: CTA Abd: +BS, soft, NT/ND Ext: no edema  Recent Labs  Lab 07/25/20 0300 07/25/20 0840 07/26/20 0741 07/27/20 0605 07/28/20 0210  NA 135 135 132* 136 135  K 3.6 3.5 3.9 3.8 4.1  CL 99 101 99 102 104  CO2 18* 18* 13* 16* 15*  GLUCOSE 219* 124* 312* 222* 205*  BUN 101* 93* 96* 99* 116*  CREATININE 6.87* 6.04* 6.63* 6.95* 7.68*  ALBUMIN 2.8* 2.7* 2.8* 2.8* 2.3*  CALCIUM 7.9* 7.9* 8.6* 8.7* 8.7*  AST 34 35 40 37 24  ALT 16 15 19 19 16    Liver Function Tests: Recent Labs  Lab 07/26/20 0741 07/27/20 0605 07/28/20 0210  AST 40 37 24  ALT 19 19 16   ALKPHOS 60 60 55  BILITOT 0.8 0.5 0.5  PROT 8.0 7.9 6.7  ALBUMIN 2.8* 2.8* 2.3*   No results for input(s): LIPASE, AMYLASE in the last 168 hours. No results for input(s): AMMONIA in the last 168 hours. CBC: Recent Labs  Lab 07/25/20 0300 07/25/20 0840 07/26/20 0741 07/27/20 0605 07/28/20 0210  WBC 10.8* 10.6* 11.9* 13.0* 9.6  NEUTROABS 9.5* 8.9* 10.9* 12.0* 9.0*  HGB 8.6* 7.4* 8.5* 8.6* 8.2*  HCT 27.2* 23.3* 26.7* 27.5* 25.9*  MCV 69.2* 69.3* 69.0* 70.0* 68.7*  PLT 241 267 312 402* 383   Cardiac Enzymes: No results for input(s): CKTOTAL, CKMB, CKMBINDEX, TROPONINI in  the last 168 hours. CBG: Recent Labs  Lab 07/27/20 1205 07/27/20 1743 07/27/20 2109 07/28/20 0740 07/28/20 1200  GLUCAP 188* 176* 188* 220* 248*    Iron Studies:  Recent Labs    07/28/20 0210  FERRITIN 2,391*   Studies/Results: No results found. Marland Kitchen amiodarone  150 mg Intravenous Once  . amLODipine  10 mg Oral Daily  . atorvastatin  20 mg Oral q1800  . chlorhexidine  15 mL Mouth Rinse BID  . Chlorhexidine Gluconate Cloth  6 each Topical Daily  . cloNIDine  0.1 mg Oral TID  . insulin aspart  0-9 Units Subcutaneous TID WC  . insulin detemir  0.075 Units/kg Subcutaneous BID  . Ipratropium-Albuterol  1 puff Inhalation BID  . linagliptin  5 mg Oral Daily  . mouth rinse  15 mL Mouth Rinse BID  . mouth rinse  15 mL Mouth Rinse q12n4p  . methylPREDNISolone (SOLU-MEDROL) injection  40 mg Intravenous Q12H  . sodium chloride flush  3 mL Intravenous Q12H    BMET    Component Value Date/Time   NA 135 07/28/2020 0210   NA 140 05/05/2020 1237   K 4.1 07/28/2020 0210   CL 104 07/28/2020 0210   CO2 15 (L) 07/28/2020 0210  GLUCOSE 205 (H) 07/28/2020 0210   BUN 116 (H) 07/28/2020 0210   BUN 43 (H) 05/05/2020 1237   CREATININE 7.68 (H) 07/28/2020 0210   CALCIUM 8.7 (L) 07/28/2020 0210   CALCIUM 8.8 01/15/2018 1005   GFRNONAA 5 (L) 07/28/2020 0210   GFRAA 18 (L) 05/05/2020 1237   CBC    Component Value Date/Time   WBC 9.6 07/28/2020 0210   RBC 3.77 (L) 07/28/2020 0210   HGB 8.2 (L) 07/28/2020 0210   HGB 9.1 (L) 01/30/2020 1558   HCT 25.9 (L) 07/28/2020 0210   HCT 30.6 (L) 01/30/2020 1558   PLT 383 07/28/2020 0210   PLT 251 01/30/2020 1558   MCV 68.7 (L) 07/28/2020 0210   MCV 72 (L) 01/30/2020 1558   MCH 21.8 (L) 07/28/2020 0210   MCHC 31.7 07/28/2020 0210   RDW 15.5 07/28/2020 0210   RDW 15.7 (H) 01/30/2020 1558   LYMPHSABS 0.4 (L) 07/28/2020 0210   MONOABS 0.2 07/28/2020 0210   EOSABS 0.0 07/28/2020 0210   BASOSABS 0.0 07/28/2020 0210      Assessment/Plan:  1. AKI on CKD stage IV-  In setting of sepsis due to covid-19 and CAP.  Baseline Scr 2.7-3.  Renal US without obstruction but did show increased cortical echogenicity consistent with chronic medical renal disease.  No improvement with IVF's and rising BUN/Cr.  She is currently without uremic symptoms, however her BUN is markedly elevated and her Scr continues to climb.  I did discuss her worsening renal function and dialysis.  She would be amenable to HD if needed. 1. Have consulted IR for temporary HD catheter placement. 2. Will likely need to proceed with HD in am after HD catheter placed 2. Acute hypoxemic respiratory failure due to covid-19 and community acquired PNA- on IV steroids, but not on remdesivir due to AKI and no baricitinib due to bacterial infection. 3. Sepsis due to covid-19 and CAP- improving with antibiotics (rocephin and doxycycline) 4. Atrial fib with RVR- started on amiodarone per Cardiology 5. Acute metabolic encephalopathy- likely multifactorial with AKI, covid-19, hypoxia, and sepsis.  Improving. 6. Anemia of acute illness and CKD stage IV- follow H/H and iron stores. 7. AGMA- due to #1.  Start bicarb and follow.  8. HTN- stable 9. DM type 2  - per primary.  Donetta Potts, MD [employer] [phone]

## 2020-07-28 NOTE — Progress Notes (Signed)
ANTICOAGULATION CONSULT NOTE -  Pharmacy Consult for Heparin Indication: atrial fibrillation  Allergies  Allergen Reactions  . Penicillins Swelling and Rash    Patient Measurements: Height: 5\' 1"  (154.9 cm) Weight: 68.6 kg (151 lb 3.8 oz) IBW/kg (Calculated) : 47.8 Heparin Dosing Weight: 60 kg  Vital Signs: Temp: 97.9 F (36.6 C) (12/21 0324) Temp Source: Axillary (12/21 0324) BP: 113/61 (12/21 0324) Pulse Rate: 73 (12/21 0324)  Labs: Recent Labs    07/25/20 1113 07/25/20 2100 07/26/20 0741 07/26/20 1831 07/27/20 0605 07/27/20 0808 07/28/20 0210  HGB  --   --  8.5*  --  8.6*  --  8.2*  HCT  --   --  26.7*  --  27.5*  --  25.9*  PLT  --   --  312  --  402*  --  383  APTT 32  --   --   --   --   --   --   LABPROT 15.2  --   --   --   --   --   --   INR 1.2  --   --   --   --   --   --   HEPARINUNFRC  --    < > 0.86* 0.96*  --  0.51 <0.10*  CREATININE  --   --  6.63*  --  6.95*  --  7.68*   < > = values in this interval not displayed.    Estimated Creatinine Clearance: 5 mL/min (A) (by C-G formula based on SCr of 7.68 mg/dL (H)).  Assessment 84 y.o. female with Afib for heparin  Goal of Therapy:  Heparin level 0.3-0.7 units/ml Monitor platelets by anticoagulation protocol: Yes   Plan:  Increase Heparin 650 units/hr Check heparin level in 8 hours.   Caryl Pina  07/28/2020, 5:25 AM

## 2020-07-28 NOTE — Progress Notes (Signed)
Inpatient Diabetes Program Recommendations  AACE/ADA: New Consensus Statement on Inpatient Glycemic Control   Target Ranges:  Prepandial:   less than 140 mg/dL      Peak postprandial:   less than 180 mg/dL (1-2 hours)      Critically ill patients:  140 - 180 mg/dL   Results for WAYLYNN, BENEFIEL (MRN [id number]) as of 07/28/2020 10:48  Ref. Range 07/27/2020 12:05 07/27/2020 17:43 07/27/2020 21:09 07/28/2020 07:40  Glucose-Capillary Latest Ref Range: 70 - 99 mg/dL 188 (H) 176 (H) 188 (H) 220 (H)   Review of Glycemic Control  Diabetes history: DM2 Outpatient Diabetes medications: Levemir 30 units QAM if glucose high, Levemir 15 units QHS if glucose high Current orders for Inpatient glycemic control: Levemir 5 units BID, Novolog 0-9 units TID with meals, Tradjenta 5 mg daily; Solumedrol 40 mg Q12H  Inpatient Diabetes Program Recommendations:    Insulin: If steroids are continued as ordered, please consider increasing Levemir to 7 units BID and add Novolog 0-5 units QHS for bedtime correction.   Thanks, Barnie Alderman, RN, MSN, CDE Diabetes Coordinator Inpatient Diabetes Program [phone] (Team Pager from 8am to 5pm)

## 2020-07-29 ENCOUNTER — Inpatient Hospital Stay (HOSPITAL_COMMUNITY): Payer: Medicare HMO

## 2020-07-29 DIAGNOSIS — I4891 Unspecified atrial fibrillation: Secondary | ICD-10-CM | POA: Diagnosis not present

## 2020-07-29 DIAGNOSIS — U071 COVID-19: Secondary | ICD-10-CM | POA: Diagnosis not present

## 2020-07-29 DIAGNOSIS — J9601 Acute respiratory failure with hypoxia: Secondary | ICD-10-CM | POA: Diagnosis not present

## 2020-07-29 DIAGNOSIS — N179 Acute kidney failure, unspecified: Secondary | ICD-10-CM | POA: Diagnosis not present

## 2020-07-29 HISTORY — PX: IR FLUORO GUIDE CV LINE RIGHT: IMG2283

## 2020-07-29 HISTORY — PX: IR US GUIDE VASC ACCESS RIGHT: IMG2390

## 2020-07-29 LAB — HEMOGLOBIN A1C
Hgb A1c MFr Bld: 8 % — ABNORMAL HIGH (ref 4.8–5.6)
Mean Plasma Glucose: 182.9 mg/dL

## 2020-07-29 LAB — GLUCOSE, CAPILLARY
Glucose-Capillary: 269 mg/dL — ABNORMAL HIGH (ref 70–99)
Glucose-Capillary: 209 mg/dL — ABNORMAL HIGH (ref 70–99)
Glucose-Capillary: 172 mg/dL — ABNORMAL HIGH (ref 70–99)
Glucose-Capillary: 125 mg/dL — ABNORMAL HIGH (ref 70–99)

## 2020-07-29 LAB — CBC WITH DIFFERENTIAL/PLATELET
Abs Immature Granulocytes: 0.14 10*3/uL — ABNORMAL HIGH (ref 0.00–0.07)
Basophils Absolute: 0 10*3/uL (ref 0.0–0.1)
Basophils Relative: 0 %
Eosinophils Absolute: 0 10*3/uL (ref 0.0–0.5)
Eosinophils Relative: 0 %
HCT: 23.2 % — ABNORMAL LOW (ref 36.0–46.0)
Hemoglobin: 7.7 g/dL — ABNORMAL LOW (ref 12.0–15.0)
Immature Granulocytes: 1 %
Lymphocytes Relative: 2 %
Lymphs Abs: 0.2 10*3/uL — ABNORMAL LOW (ref 0.7–4.0)
MCH: 22.3 pg — ABNORMAL LOW (ref 26.0–34.0)
MCHC: 33.2 g/dL (ref 30.0–36.0)
MCV: 67.1 fL — ABNORMAL LOW (ref 80.0–100.0)
Monocytes Absolute: 0.3 10*3/uL (ref 0.1–1.0)
Monocytes Relative: 2 %
Neutro Abs: 11.2 10*3/uL — ABNORMAL HIGH (ref 1.7–7.7)
Neutrophils Relative %: 95 %
Platelets: 393 10*3/uL (ref 150–400)
RBC: 3.46 MIL/uL — ABNORMAL LOW (ref 3.87–5.11)
RDW: 15.3 % (ref 11.5–15.5)
WBC: 11.9 10*3/uL — ABNORMAL HIGH (ref 4.0–10.5)
nRBC: 0.2 % (ref 0.0–0.2)

## 2020-07-29 LAB — C-REACTIVE PROTEIN: CRP: 6.9 mg/dL — ABNORMAL HIGH (ref <1.0)

## 2020-07-29 LAB — LIPID PANEL
Cholesterol: 127 mg/dL (ref 0–200)
HDL: 54 mg/dL (ref >40)
LDL Cholesterol: 57 mg/dL (ref 0–99)
Total CHOL/HDL Ratio: 2.4 RATIO
Triglycerides: 81 mg/dL (ref <150)
VLDL: 16 mg/dL (ref 0–40)

## 2020-07-29 LAB — COMPREHENSIVE METABOLIC PANEL
ALT: 17 U/L (ref 0–44)
AST: 21 U/L (ref 15–41)
Albumin: 2.5 g/dL — ABNORMAL LOW (ref 3.5–5.0)
Alkaline Phosphatase: 56 U/L (ref 38–126)
Anion gap: 18 — ABNORMAL HIGH (ref 5–15)
BUN: 137 mg/dL — ABNORMAL HIGH (ref 8–23)
CO2: 15 mmol/L — ABNORMAL LOW (ref 22–32)
Calcium: 8.8 mg/dL — ABNORMAL LOW (ref 8.9–10.3)
Chloride: 104 mmol/L (ref 98–111)
Creatinine, Ser: 8.56 mg/dL — ABNORMAL HIGH (ref 0.44–1.00)
GFR, Estimated: 4 mL/min — ABNORMAL LOW (ref >60)
Glucose, Bld: 233 mg/dL — ABNORMAL HIGH (ref 70–99)
Potassium: 4 mmol/L (ref 3.5–5.1)
Sodium: 137 mmol/L (ref 135–145)
Total Bilirubin: 0.8 mg/dL (ref 0.3–1.2)
Total Protein: 6.6 g/dL (ref 6.5–8.1)

## 2020-07-29 LAB — HEPARIN LEVEL (UNFRACTIONATED): Heparin Unfractionated: 0.42 IU/mL (ref 0.30–0.70)

## 2020-07-29 LAB — FERRITIN: Ferritin: 2260 ng/mL — ABNORMAL HIGH (ref 11–307)

## 2020-07-29 LAB — D-DIMER, QUANTITATIVE: D-Dimer, Quant: 1.31 ug/mL-FEU — ABNORMAL HIGH (ref 0.00–0.50)

## 2020-07-29 LAB — PHOSPHORUS: Phosphorus: 6.3 mg/dL — ABNORMAL HIGH (ref 2.5–4.6)

## 2020-07-29 LAB — PROCALCITONIN: Procalcitonin: 0.84 ng/mL

## 2020-07-29 LAB — MAGNESIUM: Magnesium: 2.1 mg/dL (ref 1.7–2.4)

## 2020-07-29 LAB — LACTATE DEHYDROGENASE: LDH: 271 U/L — ABNORMAL HIGH (ref 98–192)

## 2020-07-29 MED ORDER — INSULIN ASPART 100 UNIT/ML ~~LOC~~ SOLN
0.0000 [IU] | SUBCUTANEOUS | Status: DC
  Administered 2020-07-29: 3 [IU] via SUBCUTANEOUS
  Administered 2020-07-29: 5 [IU] via SUBCUTANEOUS
  Administered 2020-07-30: 3 [IU] via SUBCUTANEOUS
  Administered 2020-07-31: 2 [IU] via SUBCUTANEOUS

## 2020-07-29 MED ORDER — CHLORHEXIDINE GLUCONATE CLOTH 2 % EX PADS
6.0000 | MEDICATED_PAD | Freq: Every day | CUTANEOUS | Status: DC
  Administered 2020-07-30 – 2020-08-01 (×3): 6 via TOPICAL

## 2020-07-29 MED ORDER — METOPROLOL TARTRATE 12.5 MG HALF TABLET
12.5000 mg | ORAL_TABLET | Freq: Two times a day (BID) | ORAL | Status: DC
  Administered 2020-07-30 – 2020-08-03 (×9): 12.5 mg via ORAL
  Filled 2020-07-29 (×9): qty 1

## 2020-07-29 MED ORDER — HEPARIN SODIUM (PORCINE) 1000 UNIT/ML IJ SOLN
INTRAMUSCULAR | Status: AC
  Filled 2020-07-29: qty 1

## 2020-07-29 MED ORDER — SODIUM CHLORIDE 0.9% FLUSH
10.0000 mL | Freq: Two times a day (BID) | INTRAVENOUS | Status: DC
  Administered 2020-07-30 – 2020-08-13 (×17): 10 mL

## 2020-07-29 MED ORDER — SODIUM CHLORIDE 0.9% FLUSH
10.0000 mL | INTRAVENOUS | Status: DC | PRN
Start: 2020-07-29 — End: 2020-08-14

## 2020-07-29 MED ORDER — INSULIN ASPART 100 UNIT/ML ~~LOC~~ SOLN
5.0000 [IU] | Freq: Three times a day (TID) | SUBCUTANEOUS | Status: DC
  Administered 2020-07-29 – 2020-07-31 (×3): 5 [IU] via SUBCUTANEOUS

## 2020-07-29 MED ORDER — LIDOCAINE HCL (PF) 1 % IJ SOLN
INTRAMUSCULAR | Status: AC | PRN
  Administered 2020-07-29: 5 mL

## 2020-07-29 MED ORDER — SODIUM BICARBONATE 8.4 % IV SOLN
50.0000 meq | Freq: Once | INTRAVENOUS | Status: AC
  Administered 2020-07-29: 50 meq via INTRAVENOUS
  Filled 2020-07-29: qty 50

## 2020-07-29 MED ORDER — INSULIN DETEMIR 100 UNIT/ML ~~LOC~~ SOLN
7.0000 [IU] | Freq: Two times a day (BID) | SUBCUTANEOUS | Status: DC
  Administered 2020-07-29 – 2020-08-09 (×17): 7 [IU] via SUBCUTANEOUS
  Filled 2020-07-29 (×27): qty 0.07

## 2020-07-29 MED ORDER — LIDOCAINE-EPINEPHRINE 1 %-1:100000 IJ SOLN
INTRAMUSCULAR | Status: AC
  Filled 2020-07-29: qty 1

## 2020-07-29 MED ORDER — HEPARIN (PORCINE) 25000 UT/250ML-% IV SOLN
900.0000 [IU]/h | INTRAVENOUS | Status: DC
  Administered 2020-08-01: 750 [IU]/h via INTRAVENOUS
  Administered 2020-08-02 – 2020-08-04 (×2): 800 [IU]/h via INTRAVENOUS
  Filled 2020-07-29 (×5): qty 250

## 2020-07-29 NOTE — Procedures (Signed)
Pre-procedure Diagnosis: COVID; Acute renal insufficiency Post-procedure Diagnosis: Same  Successful placement of a non-tunneled HD catheter with tips terminating within the superior aspect of the right atrium.    Complications: None Immediate EBL: Minimal   The catheter is ready for immediate use.   Ronny Bacon, MD Pager #: [phone]

## 2020-07-29 NOTE — Progress Notes (Signed)
Patient ID: Jancie Kercher Winzer, female   DOB: 12-Oct-1935, 84 y.o.   MRN: [id number] S: No complaints.  Understands need for HD catheter and dialysis and is still willing to proceed. O:BP (!) 163/78 (BP Location: Right Arm)   Pulse 91   Temp (!) 97.1 F (36.2 C) (Oral)   Resp 20   Ht 5\' 1"  (1.549 m)   Wt 69.6 kg   SpO2 96%   BMI 28.99 kg/m   Intake/Output Summary (Last 24 hours) at 07/29/2020 1427 Last data filed at 07/29/2020 0500 Gross per 24 hour  Intake 648.27 ml  Output 600 ml  Net 48.27 ml   Intake/Output: I/O last 3 completed shifts: In: 815.9 [I.V.:815.9] Out: 800 [Urine:800]  Intake/Output this shift:  No intake/output data recorded. Weight change: 1 kg Gen: NAD CVS: RRR no rub Resp: cta  Abd: +BS, soft, NT/ND Ext: no edema  Recent Labs  Lab 07/25/20 0300 07/25/20 0840 07/26/20 0741 07/27/20 0605 07/28/20 0210 07/29/20 0147  NA 135 135 132* 136 135 137  K 3.6 3.5 3.9 3.8 4.1 4.0  CL 99 101 99 102 104 104  CO2 18* 18* 13* 16* 15* 15*  GLUCOSE 219* 124* 312* 222* 205* 233*  BUN 101* 93* 96* 99* 116* 137*  CREATININE 6.87* 6.04* 6.63* 6.95* 7.68* 8.56*  ALBUMIN 2.8* 2.7* 2.8* 2.8* 2.3* 2.5*  CALCIUM 7.9* 7.9* 8.6* 8.7* 8.7* 8.8*  PHOS  --   --   --   --   --  6.3*  AST 34 35 40 37 24 21  ALT 16 15 19 19 16 17    Liver Function Tests: Recent Labs  Lab 07/27/20 0605 07/28/20 0210 07/29/20 0147  AST 37 24 21  ALT 19 16 17   ALKPHOS 60 55 56  BILITOT 0.5 0.5 0.8  PROT 7.9 6.7 6.6  ALBUMIN 2.8* 2.3* 2.5*   No results for input(s): LIPASE, AMYLASE in the last 168 hours. No results for input(s): AMMONIA in the last 168 hours. CBC: Recent Labs  Lab 07/25/20 0840 07/26/20 0741 07/27/20 0605 07/28/20 0210 07/29/20 0147  WBC 10.6* 11.9* 13.0* 9.6 11.9*  NEUTROABS 8.9* 10.9* 12.0* 9.0* 11.2*  HGB 7.4* 8.5* 8.6* 8.2* 7.7*  HCT 23.3* 26.7* 27.5* 25.9* 23.2*  MCV 69.3* 69.0* 70.0* 68.7* 67.1*  PLT 267 312 402* 383 393   Cardiac Enzymes: No  results for input(s): CKTOTAL, CKMB, CKMBINDEX, TROPONINI in the last 168 hours. CBG: Recent Labs  Lab 07/28/20 1200 07/28/20 1718 07/28/20 2017 07/29/20 0722 07/29/20 1206  GLUCAP 248* 223* 201* 269* 209*    Iron Studies:  Recent Labs    07/29/20 0147  FERRITIN 2,260*   Studies/Results: No results found. Marland Kitchen amiodarone  150 mg Intravenous Once  . amLODipine  10 mg Oral Daily  . atorvastatin  20 mg Oral q1800  . chlorhexidine  15 mL Mouth Rinse BID  . Chlorhexidine Gluconate Cloth  6 each Topical Daily  . cloNIDine  0.1 mg Oral TID  . heparin sodium (porcine)      . insulin aspart  0-15 Units Subcutaneous Q4H  . insulin aspart  5 Units Subcutaneous TID WC  . insulin detemir  7 Units Subcutaneous BID  . Ipratropium-Albuterol  1 puff Inhalation BID  . lidocaine-EPINEPHrine      . linagliptin  5 mg Oral Daily  . mouth rinse  15 mL Mouth Rinse BID  . mouth rinse  15 mL Mouth Rinse q12n4p  . methylPREDNISolone (SOLU-MEDROL) injection  40  mg Intravenous Q12H  . metoprolol tartrate  12.5 mg Oral BID  . sodium bicarbonate  650 mg Oral TID  . sodium chloride flush  3 mL Intravenous Q12H    BMET    Component Value Date/Time   NA 137 07/29/2020 0147   NA 140 05/05/2020 1237   K 4.0 07/29/2020 0147   CL 104 07/29/2020 0147   CO2 15 (L) 07/29/2020 0147   GLUCOSE 233 (H) 07/29/2020 0147   BUN 137 (H) 07/29/2020 0147   BUN 43 (H) 05/05/2020 1237   CREATININE 8.56 (H) 07/29/2020 0147   CALCIUM 8.8 (L) 07/29/2020 0147   CALCIUM 8.8 01/15/2018 1005   GFRNONAA 4 (L) 07/29/2020 0147   GFRAA 18 (L) 05/05/2020 1237   CBC    Component Value Date/Time   WBC 11.9 (H) 07/29/2020 0147   RBC 3.46 (L) 07/29/2020 0147   HGB 7.7 (L) 07/29/2020 0147   HGB 9.1 (L) 01/30/2020 1558   HCT 23.2 (L) 07/29/2020 0147   HCT 30.6 (L) 01/30/2020 1558   PLT 393 07/29/2020 0147   PLT 251 01/30/2020 1558   MCV 67.1 (L) 07/29/2020 0147   MCV 72 (L) 01/30/2020 1558   MCH 22.3 (L) 07/29/2020 0147    MCHC 33.2 07/29/2020 0147   RDW 15.3 07/29/2020 0147   RDW 15.7 (H) 01/30/2020 1558   LYMPHSABS 0.2 (L) 07/29/2020 0147   MONOABS 0.3 07/29/2020 0147   EOSABS 0.0 07/29/2020 0147   BASOSABS 0.0 07/29/2020 0147   Assessment/Plan:  1. AKI on CKD stage IV-  In setting of sepsis due to covid-19 and CAP.  Baseline Scr 2.7-3.  Renal US without obstruction but did show increased cortical echogenicity consistent with chronic medical renal disease.  No improvement with IVF's and rising BUN/Cr.  She is currently without uremic symptoms, however her BUN is markedly elevated and her Scr continues to climb.  I did discuss her worsening renal function and dialysis.  She would be amenable to HD if needed. 1. Have consulted IR for temporary HD catheter placement. 2. Will likely need to proceed with HD after HD catheter placed given rising BUN/Cr 3. Starting to have increased UOP but variable (unclear if this is an issue with collection or true output). 4. Will follow how she does after initial HD. 2. Acute hypoxemic respiratory failure due to covid-19 and community acquired PNA- on IV steroids, but not on remdesivir due to AKI and no baricitinib due to bacterial infection. 3. Sepsis due to covid-19 and CAP- improving with antibiotics (rocephin and doxycycline) 4. Atrial fib with RVR- started on amiodarone per Cardiology 5. Acute metabolic encephalopathy- likely multifactorial with AKI, covid-19, hypoxia, and sepsis.  Improving. 6. Anemia of acute illness and CKD stage IV- follow H/H and iron stores. 7. AGMA- due to #1.  Start bicarb and follow.  8. HTN- stable 9. DM type 2  - per primary.   Donetta Potts, MD [employer] [phone]

## 2020-07-29 NOTE — Progress Notes (Incomplete)
Results for MARRIE, CHANDRA (MRN [id number]) as of 07/29/2020 10:44  Ref. Range 07/28/2020 07:40 07/28/2020 12:00 07/28/2020 17:18 07/28/2020 20:17 07/29/2020 07:22  Glucose-Capillary Latest Ref Range: 70 - 99 mg/dL 220 (H) 248 (H) 223 (H) 201 (H) 269 (H)  Noted that CBGs have been greater than 180 mg/dl.   Recommend increasing Levemir to 7 units BID. May want to consider increasing Novolog correction scale to MODERATE (0-15 units) and adding Novolog (0-5 units) HS scale if blood sugars continue to be elevated.   Harvel Ricks RN BSN CDE Diabetes Coordinator Pager: [phone]  8am-5pm

## 2020-07-29 NOTE — Care Management Important Message (Signed)
Important Message  Patient Details  Name: [name] [name] MRN: [id number] Date of Birth: 1936-02-19   Medicare Important Message Given:  Yes - Important Message mailed due to current National Emergency  Verbal consent obtained due to current National Emergency  Relationship to patient: Self Contact Name: Mikenna Bunkley Call Date: 07/29/20  Time: 1617 Phone: [phone] Outcome: No Answer/Busy Important Message mailed to: Patient address on file    Delorse Lek 07/29/2020, 4:17 PM

## 2020-07-29 NOTE — Plan of Care (Signed)
  Problem: Clinical Measurements: Goal: Cardiovascular complication will be avoided Outcome: Progressing   Problem: Coping: Goal: Psychosocial and spiritual needs will be supported Outcome: Progressing   Problem: Respiratory: Goal: Will maintain a patent airway Outcome: Progressing

## 2020-07-29 NOTE — Progress Notes (Signed)
[place] for Heparin Indication: atrial fibrillation  Allergies  Allergen Reactions  . Penicillins Swelling and Rash    Patient Measurements: Height: 5\' 1"  (154.9 cm) Weight: 69.6 kg (153 lb 7 oz) IBW/kg (Calculated) : 47.8 Heparin Dosing Weight: 60 kg  Vital Signs: Temp: 97.1 F (36.2 C) (12/22 1204) Temp Source: Oral (12/22 1204) BP: 153/65 (12/22 1455) Pulse Rate: 87 (12/22 1455)  Labs: Recent Labs    07/27/20 0605 07/27/20 0808 07/28/20 0210 07/28/20 1445 07/29/20 0030 07/29/20 0147  HGB 8.6*  --  8.2*  --   --  7.7*  HCT 27.5*  --  25.9*  --   --  23.2*  PLT 402*  --  383  --   --  393  HEPARINUNFRC  --    < > <0.10* 0.21* 0.42  --   CREATININE 6.95*  --  7.68*  --   --  8.56*   < > = values in this interval not displayed.    Estimated Creatinine Clearance: 4.5 mL/min (A) (by C-G formula based on SCr of 8.56 mg/dL (H)).  Assessment 84 y.o. female with Afib continues on heparin Heparin level therapeutic this AM  Now s/p HD cath - to resume heparin at 1900 pm   Goal of Therapy:  Heparin level 0.3-0.7 units/ml Monitor platelets by anticoagulation protocol: Yes   Plan:  Heparin at 750 units/hr to resume at 1900 pm Follow up AM labs  Thank you Anette Guarneri, PHarmD

## 2020-07-29 NOTE — Progress Notes (Signed)
  Echocardiogram 2D Echocardiogram was attempted but patient was in IR. We will try again as our schedule permits.   Jennette Dubin 07/29/2020, 1:53 PM

## 2020-07-29 NOTE — Progress Notes (Addendum)
[place] for Heparin Indication: atrial fibrillation  Allergies  Allergen Reactions  . Penicillins Swelling and Rash    Patient Measurements: Height: 5\' 1"  (154.9 cm) Weight: 68.6 kg (151 lb 3.8 oz) IBW/kg (Calculated) : 47.8 Heparin Dosing Weight: 60 kg  Vital Signs: Temp: 97.9 F (36.6 C) (12/22 0008) Temp Source: Oral (12/22 0008) BP: 140/76 (12/22 0008) Pulse Rate: 77 (12/22 0008)  Labs: Recent Labs    07/27/20 0605 07/27/20 0808 07/28/20 0210 07/28/20 1445 07/29/20 0030 07/29/20 0147  HGB 8.6*  --  8.2*  --   --  7.7*  HCT 27.5*  --  25.9*  --   --  23.2*  PLT 402*  --  383  --   --  393  HEPARINUNFRC  --    < > <0.10* 0.21* 0.42  --   CREATININE 6.95*  --  7.68*  --   --  8.56*   < > = values in this interval not displayed.    Estimated Creatinine Clearance: 4.5 mL/min (A) (by C-G formula based on SCr of 8.56 mg/dL (H)).  Assessment 84 y.o. female with Afib continues on heparin Heparin level therapeutic this AM   Goal of Therapy:  Heparin level 0.3-0.7 units/ml Monitor platelets by anticoagulation protocol: Yes   Plan:  Cont heparin at 750 units/hr Follow up AM labs   Thank you Anette Guarneri, PHarmD

## 2020-07-29 NOTE — Progress Notes (Signed)
PROGRESS NOTE    [name] [name]  TDD:[id number] DOB: [birth year] DOA: 07/25/2020 PCP: [name], MD     Brief Narrative:  [name] [name] is an 84 year old female with history of RA, CKD 4, DM 2, HTN, HLD, OSA on CPAP, TIA, chronic diastolic CHF.   To the hospital with 5 days of cough, lethargy, confusion. Was found to be hypoxic, tested positive for Covid and was admitted to the hospital. She was also found to have acute kidney injury. She was also found to have A. fib with RVR. Cardiology and nephrology were consulted on admission. Renal function has not improved and the patient is transferred to [facility] in anticipation of dialysis initiation.   Subjective: 12/22 afebrile overnight A/O x4, patient sitting in chair comfortably.  Understands that a Vas-Cath will be placed today by IR.    Assessment & Plan: Covid vaccination; unvaccinated   Principal Problem:   Acute hypoxemic respiratory failure due to COVID-19 [facility]) Active Problems:   Essential hypertension   HLD (hyperlipidemia)   DM (diabetes mellitus), type 2 with renal complications (HCC)   Chronic diastolic HF (heart failure) ([place])   CAP (community acquired pneumonia)   CKD (chronic kidney disease), stage IV ([place])   AKI (acute kidney injury) ([place])   Sepsis ([place])   Atrial fibrillation with RVR ([place])  Acute respiratory failure with hypoxia/Covid pneumonia/suspected bacterial superinfection COVID-19 Labs  Recent Labs    07/27/20 0605 07/28/20 0210 07/29/20 0147  DDIMER 1.29* 0.98* 1.31*  FERRITIN 2,934* 2,391* 2,260*  LDH  --   --  271*  CRP 14.1* 9.0* 6.9*    Lab Results  Component Value Date   SARSCOV2NAA POSITIVE (A) 07/25/2020  -Patient with CrCl<71ml/min therefore and eligible for Remdesivir or Baricitinib -In addition possible bacterial superinfection initial procalcitonin=Results for BRIUNNA, LEICHT (MRN [id number]) as of 07/28/2020 19:45  Ref. Range 07/25/2020 08:40   Procalcitonin Latest Units: ng/mL 2.35  -Trend procalcitonin -Cultures pending -12/22 spoke with pharmacy all antibiotics have been completed.    Sepsis pneumonia (Covid 19/suspected CAP) -On admission criteria for sepsis tachycardia, tachypnea, leukocytosis -Cultures obtained, placed on antibiotics.  All physiologic signs of sepsis have resolved.  Acute on CKD stage IV (baseline Cr 2.4-2.7) -On presentation Cr 6.8 not improving -Transferred to [facility] per nephrology. -Does not currently fluid appear overloaded. -Awaiting placement of Vas-Cath by IR  Anion gap metabolic acidosis -Worsening anion gap metabolic acidosis -51/76 1 amp sodium bicarb -Expect that Vas-Cath will be placed today and patient will start HD  Chronic diastolic CHF -BNP on admission= 259 -Strict in and out +4.4 L -Daily weight Filed Weights   07/26/20 0944 07/27/20 1610 07/29/20 0420  Weight: 66.5 kg 68.6 kg 69.6 kg  -12/22 Metoprolol 12.5 mg BID Hold for MAP<60   A. fib with RVR -CHADS2Vasc score is 7 (HTN, age x 2, DM, TIA x2, gender), Can convert to [place] later in admission -Cardiology consulted -Continue Amiodarone drip -12/22 currently sinus arrhythmia currently NSR -Continue heparin drip -Last seen by cardiology on 12/18 per their note would consider outpatient monitor after systemic illness have resolved. -Cardiology recommends echocardiogram once rate better controlled. -12/22 Echocardiogram pending  Essential HTN -See CHF  Acute metabolic encephalopathy -16/07 resolved answer questions appropriately follows all commands  Hx TIA -Hold aspirin in the setting of heparin infusion -Lipitor 20 mg daily -12/22 LDL = 57: Goal LDL <70   DM type II uncontrolled with Hyperglycemia -12/22 Hemoglobin A1c= 7.7  -12/22 Levemir 7 units BID -12/22  NovoLog 5 units qac -12/22 moderate SSI  -Tradjenta 5 mg daily  Anemia -Anemia panel pending -Occult blood pending      DVT prophylaxis: Heparin  drip Code Status: Full Family Communication:  Status is: Inpatient    Dispo: The patient is from:               Anticipated d/c is to: Home              Anticipated d/c date is: 12/25              Patient currently unstable       Consultants:  Nephrology Cardiology  Procedures/Significant Events:    I have personally reviewed and interpreted all radiology studies and my findings are as above.  VENTILATOR SETTINGS: HFNC 12/22 Flow; 6 L/min  SPO2; 95%   Cultures   Antimicrobials: Anti-infectives (From admission, onward)   Start     Ordered Stop   07/26/20 1000  cefTRIAXone (ROCEPHIN) 1 g in sodium chloride 0.9 % 100 mL IVPB        07/25/20 1138 07/30/20 0959   07/26/20 1000  doxycycline (VIBRAMYCIN) 100 mg in sodium chloride 0.9 % 250 mL IVPB        07/25/20 1138 07/27/20 2335   07/25/20 0500  levofloxacin (LEVAQUIN) IVPB 750 mg        07/25/20 0453 07/25/20 0824         Devices    LINES / TUBES:      Continuous Infusions: . amiodarone 30 mg/hr (07/29/20 0358)  . diltiazem (CARDIZEM) infusion Stopped (07/27/20 0233)  . heparin 750 Units/hr (07/28/20 1738)     Objective: Vitals:   07/29/20 0008 07/29/20 0419 07/29/20 0420 07/29/20 0723  BP: 140/76 139/70  (!) 144/73  Pulse: 77 74  90  Resp: 20 (!) 23  20  Temp: 97.9 F (36.6 C) 98.6 F (37 C) 98.6 F (37 C) 97.6 F (36.4 C)  TempSrc: Oral Axillary Axillary Oral  SpO2: 97% 97%  98%  Weight:   69.6 kg   Height:        Intake/Output Summary (Last 24 hours) at 07/29/2020 0948 Last data filed at 07/29/2020 0500 Gross per 24 hour  Intake 648.27 ml  Output 600 ml  Net 48.27 ml   Filed Weights   07/26/20 0944 07/27/20 1610 07/29/20 0420  Weight: 66.5 kg 68.6 kg 69.6 kg    Examination:  General: A/O x4 No acute respiratory distress Eyes: negative scleral hemorrhage, negative anisocoria, negative icterus ENT: Negative Runny nose, negative gingival bleeding, Neck:  Negative scars,  masses, torticollis, lymphadenopathy, JVD Lungs: Clear to auscultation bilaterally without wheezes or crackles Cardiovascular: Regular rate and rhythm without murmur gallop or rub normal S1 and S2 Abdomen: negative abdominal pain, nondistended, positive soft, bowel sounds, no rebound, no ascites, no appreciable mass Extremities: No significant cyanosis, clubbing, or edema bilateral lower extremities Skin: Negative rashes, lesions, ulcers Psychiatric:  Negative depression, negative anxiety, negative fatigue, negative mania  Central nervous system:  Cranial nerves II through XII intact, tongue/uvula midline, all extremities muscle strength 5/5, sensation intact throughout,  negative dysarthria, negative expressive aphasia, negative receptive aphasia.  .     Data Reviewed: Care during the described time interval was provided by me .  I have reviewed this patient's available data, including medical history, events of note, physical examination, and all test results as part of my evaluation.  CBC: Recent Labs  Lab 07/25/20 0840 07/26/20 0741 07/27/20 0605 07/28/20  0210 07/29/20 0147  WBC 10.6* 11.9* 13.0* 9.6 11.9*  NEUTROABS 8.9* 10.9* 12.0* 9.0* 11.2*  HGB 7.4* 8.5* 8.6* 8.2* 7.7*  HCT 23.3* 26.7* 27.5* 25.9* 23.2*  MCV 69.3* 69.0* 70.0* 68.7* 67.1*  PLT 267 312 402* 383 790   Basic Metabolic Panel: Recent Labs  Lab 07/25/20 0840 07/25/20 1113 07/26/20 0741 07/27/20 0605 07/28/20 0210 07/29/20 0147  NA 135  --  132* 136 135 137  K 3.5  --  3.9 3.8 4.1 4.0  CL 101  --  99 102 104 104  CO2 18*  --  13* 16* 15* 15*  GLUCOSE 124*  --  312* 222* 205* 233*  BUN 93*  --  96* 99* 116* 137*  CREATININE 6.04*  --  6.63* 6.95* 7.68* 8.56*  CALCIUM 7.9*  --  8.6* 8.7* 8.7* 8.8*  MG  --  2.0  --   --   --  2.1  PHOS  --   --   --   --   --  6.3*   GFR: Estimated Creatinine Clearance: 4.5 mL/min (A) (by C-G formula based on SCr of 8.56 mg/dL (H)). Liver Function Tests: Recent Labs   Lab 07/25/20 0840 07/26/20 0741 07/27/20 0605 07/28/20 0210 07/29/20 0147  AST 35 40 37 24 21  ALT 15 19 19 16 17   ALKPHOS 53 60 60 55 56  BILITOT 0.3 0.8 0.5 0.5 0.8  PROT 7.7 8.0 7.9 6.7 6.6  ALBUMIN 2.7* 2.8* 2.8* 2.3* 2.5*   No results for input(s): LIPASE, AMYLASE in the last 168 hours. No results for input(s): AMMONIA in the last 168 hours. Coagulation Profile: Recent Labs  Lab 07/25/20 1113  INR 1.2   Cardiac Enzymes: No results for input(s): CKTOTAL, CKMB, CKMBINDEX, TROPONINI in the last 168 hours. BNP (last 3 results) No results for input(s): PROBNP in the last 8760 hours. HbA1C: Recent Labs    07/29/20 0147  HGBA1C 8.0*   CBG: Recent Labs  Lab 07/28/20 0740 07/28/20 1200 07/28/20 1718 07/28/20 2017 07/29/20 0722  GLUCAP 220* 248* 223* 201* 269*   Lipid Profile: Recent Labs    07/29/20 0147  CHOL 127  HDL 54  LDLCALC 57  TRIG 81  CHOLHDL 2.4   Thyroid Function Tests: No results for input(s): TSH, T4TOTAL, FREET4, T3FREE, THYROIDAB in the last 72 hours. Anemia Panel: Recent Labs    07/28/20 0210 07/29/20 0147  FERRITIN 2,391* 2,260*   Sepsis Labs: Recent Labs  Lab 07/25/20 0300 07/25/20 0840 07/28/20 2043 07/29/20 0147  PROCALCITON  --  2.35 0.96 0.84  LATICACIDVEN 1.3  --   --   --     Recent Results (from the past 240 hour(s))  Resp Panel by RT-PCR (Flu A&B, Covid) Nasopharyngeal Swab     Status: Abnormal   Collection Time: 07/25/20  1:56 AM   Specimen: Nasopharyngeal Swab; Nasopharyngeal(NP) swabs in vial transport medium  Result Value Ref Range Status   SARS Coronavirus 2 by RT PCR POSITIVE (A) NEGATIVE Final    Comment: CRITICAL RESULT CALLED TO, READ BACK BY AND VERIFIED WITH: Burnis Medin RN 07/25/20 @0603  BY P.HENDERSON (NOTE) SARS-CoV-2 target nucleic acids are DETECTED.  The SARS-CoV-2 RNA is generally detectable in upper respiratory specimens during the acute phase of infection. Positive results are indicative  of the presence of the identified virus, but do not rule out bacterial infection or co-infection with other pathogens not detected by the test. Clinical correlation with patient history and other diagnostic  information is necessary to determine patient infection status. The expected result is Negative.  Fact Sheet for Patients: EntrepreneurPulse.com.au  Fact Sheet for Healthcare Providers: IncredibleEmployment.be  This test is not yet approved or cleared by the [place] FDA and  has been authorized for detection and/or diagnosis of SARS-CoV-2 by FDA under an Emergency Use Authorization (EUA).  This EUA will remain in effect (me aning this test can be used) for the duration of  the COVID-19 declaration under Section 564(b)(1) of the Act, 21 U.S.C. section 360bbb-3(b)(1), unless the authorization is terminated or revoked sooner.     Influenza A by PCR NEGATIVE NEGATIVE Final   Influenza B by PCR NEGATIVE NEGATIVE Final    Comment: (NOTE) The Xpert Xpress SARS-CoV-2/FLU/RSV plus assay is intended as an aid in the diagnosis of influenza from Nasopharyngeal swab specimens and should not be used as a sole basis for treatment. Nasal washings and aspirates are unacceptable for Xpert Xpress SARS-CoV-2/FLU/RSV testing.  Fact Sheet for Patients: EntrepreneurPulse.com.au  Fact Sheet for Healthcare Providers: IncredibleEmployment.be  This test is not yet approved or cleared by the [place] FDA and has been authorized for detection and/or diagnosis of SARS-CoV-2 by FDA under an Emergency Use Authorization (EUA). This EUA will remain in effect (meaning this test can be used) for the duration of the COVID-19 declaration under Section 564(b)(1) of the Act, 21 U.S.C. section 360bbb-3(b)(1), unless the authorization is terminated or revoked.  Performed at [facility], [place] [address], [place], [place] [address]   Culture, blood (single)     Status: None (Preliminary result)   Collection Time: 07/25/20  6:19 AM   Specimen: BLOOD  Result Value Ref Range Status   Specimen Description   Final    BLOOD RIGHT ANTECUBITAL Performed at [facility] [address], [place], [place] [address]    Special Requests   Final    BOTTLES DRAWN AEROBIC AND ANAEROBIC Blood Culture adequate volume Performed at [facility] [address] [place], [place] [address]    Culture   Final    NO GROWTH 3 DAYS Performed at [facility], [place] [address] [place], [place] [address]    Report Status PENDING  Incomplete  MRSA PCR Screening     Status: None   Collection Time: 07/25/20 11:58 AM   Specimen: Nasopharyngeal  Result Value Ref Range Status   MRSA by PCR NEGATIVE NEGATIVE Final    Comment:        The GeneXpert MRSA Assay (FDA approved for NASAL specimens only), is one component of a comprehensive MRSA colonization surveillance program. It is not intended to diagnose MRSA infection nor to guide or monitor treatment for MRSA infections. Performed at [facility], [place] [address], [place],  [address]   Blood Culture (routine x 2)     Status: None (Preliminary result)   Collection Time: 07/25/20  4:18 PM   Specimen: BLOOD  Result Value Ref Range Status   Specimen Description   Final    BLOOD RIGHT HAND Performed at [facility] [address] [place],  [address]    Special Requests   Final    BOTTLES DRAWN AEROBIC ONLY Blood Culture results may not be optimal due to an inadequate volume of blood received in culture bottles Performed at [facility] [address], [place],  [address]    Culture   Final    NO GROWTH  3 DAYS Performed at [facility], [place] [address] [place], [place] [address]    Report Status PENDING  Incomplete  Blood  Culture (routine x 2)     Status: None (Preliminary result)   Collection Time: 07/25/20  4:18 PM   Specimen: BLOOD  Result Value Ref Range Status   Specimen Description   Final    BLOOD RIGHT HAND Performed at [facility] [address] [place], [place] [address]    Special Requests   Final    BOTTLES DRAWN AEROBIC ONLY Blood Culture adequate volume Performed at [facility] [address] [place], [place] [address]    Culture   Final    NO GROWTH 3 DAYS Performed at [facility], [place] [address], [place], [place] [address]    Report Status PENDING  Incomplete         Radiology Studies: No results found.      Scheduled Meds: . amiodarone  150 mg Intravenous Once  . amLODipine  10 mg Oral Daily  . atorvastatin  20 mg Oral q1800  . chlorhexidine  15 mL Mouth Rinse BID  . Chlorhexidine Gluconate Cloth  6 each Topical Daily  . cloNIDine  0.1 mg Oral TID  . insulin aspart  0-9 Units Subcutaneous TID WC  . insulin detemir  0.075 Units/kg Subcutaneous BID  . Ipratropium-Albuterol  1 puff Inhalation BID  . linagliptin  5 mg Oral Daily  . mouth rinse  15 mL Mouth Rinse BID  . mouth rinse  15 mL Mouth Rinse q12n4p  . methylPREDNISolone (SOLU-MEDROL) injection  40 mg Intravenous Q12H  . sodium bicarbonate  650 mg Oral TID  . sodium chloride flush  3 mL Intravenous Q12H   Continuous Infusions: . amiodarone 30 mg/hr (07/29/20 0358)  . diltiazem (CARDIZEM) infusion Stopped (07/27/20 0233)  . heparin 750 Units/hr (07/28/20 1738)     LOS: 4 days    Time spent:40 min    Ethel Meisenheimer, Geraldo Docker, MD [facility] Pager [phone]  If 7PM-7AM, please contact night-coverage [email or web address] Password [facility] 07/29/2020, 9:48 AM

## 2020-07-29 NOTE — Evaluation (Signed)
Occupational Therapy Evaluation Patient Details Name: [name] [name] MRN: [id number] DOB: 07-22-1936 Today's Date: 07/29/2020    History of Present Illness Pt is an 84 y.o. female admitted 07/25/20 with 5 days of cough, lethargy, confusion; found to be hypoxic and tested (+) COVID-19, also with AKI, afib with RVR, sepsis. PMH includes RA, CKD 4, DM2, HTN, OSA on CPAP, TIA, CHF.   Clinical Impression   Pt is typically mod I with SPC and lives alone - she has good family support who check in on her. Today she presents with confusion, decreased activity tolerance, decreased balance, and decreased independence in ADL. She required mod A for sink level grooming (she was able to stand - but required a seated rest break prior to ambulating to chair) she is at least mod A for LB ADL. Pt on 6L via HFNC and Saturations remained >92% throughout session with max HR of 98. Pt will benefit from skilled OT in the acute setting, and afterwards at the [facility] level pending confirmation that family can provide 24/7 supervision and assist.    Follow Up Recommendations  Home health OT;Supervision/Assistance - 24 hour (will need to confirm 24/7 assist)    Equipment Recommendations  3 in 1 bedside commode    Recommendations for Other Services       Precautions / Restrictions Precautions Precautions: Fall Restrictions Weight Bearing Restrictions: No      Mobility Bed Mobility Overal bed mobility: Needs Assistance Bed Mobility: Supine to Sit     Supine to sit: Mod assist;HOB elevated     General bed mobility comments: Pt with no initiation, needed assist to get BLE to EOB, and for trunk elevation - min A physial assist increased need because of cues    Transfers Overall transfer level: Needs assistance Equipment used: Rolling walker (2 wheeled) Transfers: Sit to/from Stand Sit to Stand: Min assist         General transfer comment: Reliant on minA to assist trunk elevation and maintain  stability    Balance Overall balance assessment: Needs assistance   Sitting balance-Leahy Scale: Fair Sitting balance - Comments: Prolonged sitting EOB without issue; difficulty reaching feet requiring assist to don socks     Standing balance-Leahy Scale: Fair Standing balance comment: Can static stand without UE support, unable to accept challenge                           ADL either performed or assessed with clinical judgement   ADL Overall ADL's : Needs assistance/impaired Eating/Feeding: Minimal assistance;Sitting Eating/Feeding Details (indicate cue type and reason): Pt had not eaten any of her breakfast tray upon entering. She states lack of appetite Grooming: Oral care;Wash/dry face;Wash/dry hands;Moderate assistance;Standing Grooming Details (indicate cue type and reason): Pt unable to sequence grooming tasks today, required mod A throughout tasks Upper Body Bathing: Moderate assistance   Lower Body Bathing: Moderate assistance   Upper Body Dressing : Minimal assistance   Lower Body Dressing: Maximal assistance   Toilet Transfer: Ambulation;RW;Moderate assistance Toilet Transfer Details (indicate cue type and reason): needed assist for ambulating with RW, directional cues, and physical assist to [facility]. min A for actual ambulation increased assist bc of cues needed Toileting- Clothing Manipulation and Hygiene: Maximal assistance       Functional mobility during ADLs: Moderate assistance;Cueing for safety;Cueing for sequencing;Rolling walker General ADL Comments: increased confusion, very pleasant, fatigues quickly     Vision Baseline Vision/History: Wears glasses Additional Comments: glasses  not present - at home     Perception     Praxis      Pertinent Vitals/Pain Pain Assessment: No/denies pain     Hand Dominance Right   Extremity/Trunk Assessment Upper Extremity Assessment Upper Extremity Assessment: Generalized weakness   Lower  Extremity Assessment Lower Extremity Assessment: Defer to PT evaluation   Cervical / Trunk Assessment Cervical / Trunk Assessment:  (rounded shoulders, fwd head)   Communication Communication Communication: HOH   Cognition Arousal/Alertness: Awake/alert Behavior During Therapy: WFL for tasks assessed/performed Overall Cognitive Status: Impaired/Different from baseline Area of Impairment: Attention;Memory;Following commands;Awareness;Problem solving                   Current Attention Level: Selective Memory: Decreased short-term memory Following Commands: Follows one step commands with increased time   Awareness: Intellectual Problem Solving: Slow processing;Decreased initiation;Difficulty sequencing;Requires verbal cues;Requires tactile cues General Comments: Pt needed step by step directions, unable to complete oral care without verbal and physical assist, needed assist for navigating with RW   General Comments  Pt on 6L O2 throughout session, SpO2 >90% throughout, HR WFL, DOE 2/4 with activity    Exercises     Shoulder Instructions      Home Living Family/patient expects to be discharged to:: Private residence Living Arrangements: Alone Available Help at Discharge: Family;Available 24 hours/day Type of Home: Apartment Home Access: Level entry     Home Layout: One level     Bathroom Shower/Tub: Teacher, early years/pre: Standard     Home Equipment: Cane - single point;Grab bars - tub/shower   Additional Comments: Daughter [name]) and other children nearby, likely able to provide 24/7 assist per daughter      Prior Functioning/Environment Level of Independence: Independent        Comments: Pt reports mod indep with intermittent use of SPC        OT Problem List: Decreased activity tolerance;Impaired balance (sitting and/or standing);Decreased cognition;Decreased safety awareness;Decreased knowledge of use of DME or AE;Cardiopulmonary  status limiting activity      OT Treatment/Interventions: Self-care/ADL training;Energy conservation;DME and/or AE instruction;Therapeutic activities;Cognitive remediation/compensation;Patient/family education;Balance training    OT Goals(Current goals can be found in the care plan section) Acute Rehab OT Goals Patient Stated Goal: Pt agreeable to post-acute rehab; family reports they will likely plan to have pt return home with 24/7 family assist OT Goal Formulation: With patient Time For Goal Achievement: 08/12/20 Potential to Achieve Goals: Good ADL Goals Pt Will Perform Grooming: with supervision;standing Pt Will Perform Upper Body Dressing: with set-up;sitting Pt Will Perform Lower Body Dressing: with min guard assist;sit to/from stand Pt Will Transfer to Toilet: with min guard assist;ambulating Pt Will Perform Toileting - Clothing Manipulation and hygiene: with supervision;sit to/from stand Pt/caregiver will Perform Home Exercise Program: Both right and left upper extremity;With theraband;With Supervision;With written HEP provided  OT Frequency: Min 2X/week   Barriers to D/C:    Daughter reports 24/7 care - will need to confirm       Co-evaluation              AM-PAC OT "6 Clicks" Daily Activity     Outcome Measure Help from another person eating meals?: A Little Help from another person taking care of personal grooming?: A Lot Help from another person toileting, which includes using toliet, bedpan, or urinal?: A Lot Help from another person bathing (including washing, rinsing, drying)?: A Lot Help from another person to put on and taking off regular upper  body clothing?: A Little Help from another person to put on and taking off regular lower body clothing?: A Lot 6 Click Score: 14   End of Session Equipment Utilized During Treatment: Gait belt;Rolling walker;Oxygen (6L via HFNC) Nurse Communication: Mobility status  Activity Tolerance: Patient tolerated treatment  well Patient left: in chair;with call bell/phone within reach;with chair alarm set  OT Visit Diagnosis: Unsteadiness on feet (R26.81);Other abnormalities of gait and mobility (R26.89);Muscle weakness (generalized) (M62.81);Other symptoms and signs involving cognitive function                Time: 7628-3151 OT Time Calculation (min): 15 min Charges:  OT General Charges $OT Visit: 1 Visit OT Evaluation $OT Eval Moderate Complexity: [place] OTR/L Acute Rehabilitation Services Pager: [phone] Office: [place] 07/29/2020, 10:15 AM

## 2020-07-29 NOTE — Progress Notes (Signed)
Physical Therapy Treatment Patient Details Name: [name] [name] MRN: [id number] DOB: 02-Oct-1935 Today's Date: 07/29/2020    History of Present Illness Pt is an 84 y.o. female admitted 07/25/20 with 5 days of cough, lethargy, confusion; found to be hypoxic and tested (+) COVID-19, also with AKI, afib with RVR, sepsis. Likely plan for temporary HD cath placed and HD initiation 12/22. PMH includes RA, CKD 4, DM2, HTN, OSA on CPAP, TIA, CHF.   PT Comments    Pt with apparent increased fatigue and slowed processing this morning, requiring increased time and effort for all mobility, and significant increased cues for sequencing. Stability improved with use of RW this session; pt requires consistent minA for stability with standing activity. Remains limited by generalized weakness and decreased activity tolerance. Continue to recommend SNF-level therapies to maximize functional mobility and independence. If family to have pt return home, will required 24/7 assist and DME.  SpO2 90-96% on 6L O2 HFNC   Follow Up Recommendations  SNF;Supervision/Assistance - 24 hour     Equipment Recommendations  Rolling walker with 5" wheels;3in1 (PT) (TBD)    Recommendations for Other Services       Precautions / Restrictions Precautions Precautions: Fall Restrictions Weight Bearing Restrictions: No    Mobility  Bed Mobility Overal bed mobility: Needs Assistance Bed Mobility: Supine to Sit     Supine to sit: Min assist;Mod assist;HOB elevated     General bed mobility comments: Pt with no initiation, needed assist to get BLE to EOB, and for trunk elevation - min A physial assist increased need because of cues  Transfers Overall transfer level: Needs assistance Equipment used: Rolling walker (2 wheeled) Transfers: Sit to/from Stand Sit to Stand: Min assist         General transfer comment: Multiple sit<>stands from EOB and chair to RW with minA for trnk elevation and stability; cues  for hand placement and sequencing  Ambulation/Gait Ambulation/Gait assistance: Min assist Gait Distance (Feet): 14 Feet Assistive device: Rolling walker (2 wheeled) Gait Pattern/deviations: Step-to pattern;Trunk flexed Gait velocity: Decreased   General Gait Details: Slow, fatigued gait with RW and minA for stability and RW navigation, as well as cues for sequencing; pt's stability improved with RW, but requiring increased time for processing and step by step cues for directions to walk towards sink and recliner (although pt reports vision in tact enough to see these things in room)   Stairs             Wheelchair Mobility    Modified Rankin (Stroke Patients Only)       Balance Overall balance assessment: Needs assistance Sitting-balance support: Feet unsupported Sitting balance-Leahy Scale: Fair Sitting balance - Comments: Prolonged sitting EOB without feet support (due to pt's short height) without issue     Standing balance-Leahy Scale: Poor Standing balance comment: Reliant on UE support or trunk leaning on sink to maintain stability while standing                            Cognition Arousal/Alertness: Awake/alert Behavior During Therapy: WFL for tasks assessed/performed Overall Cognitive Status: Impaired/Different from baseline Area of Impairment: Attention;Memory;Following commands;Awareness;Problem solving                   Current Attention Level: Selective Memory: Decreased short-term memory Following Commands: Follows one step commands with increased time   Awareness: Intellectual Problem Solving: Slow processing;Decreased initiation;Difficulty sequencing;Requires verbal cues;Requires tactile cues General Comments:  Pt needed step by step directions, unable to complete oral care without verbal and physical assist, needed assist for navigating with RW      Exercises      General Comments General comments (skin integrity, edema,  etc.): SpO2 90-96% on 6L O2 [place], DOE 2/4 with activity, HR 98      Pertinent Vitals/Pain Pain Assessment: No/denies pain    Home Living Family/patient expects to be discharged to:: Private residence Living Arrangements: Alone Available Help at Discharge: Family;Available 24 hours/day Type of Home: Apartment Home Access: Level entry   Home Layout: One level Home Equipment: Cane - single point;Grab bars - tub/shower Additional Comments: Daughter [name]) and other children nearby, likely able to provide 24/7 assist per daughter    Prior Function Level of Independence: Independent      Comments: Pt reports mod indep with intermittent use of SPC   PT Goals (current goals can now be found in the care plan section) Acute Rehab PT Goals Patient Stated Goal: Pt agreeable to post-acute rehab; family reports they will likely plan to have pt return home with 24/7 family assist Progress towards PT goals: Progressing toward goals    Frequency    Min 3X/week      PT Plan Current plan remains appropriate    Co-evaluation              AM-PAC PT "6 Clicks" Mobility   Outcome Measure  Help needed turning from your back to your side while in a flat bed without using bedrails?: A Little Help needed moving from lying on your back to sitting on the side of a flat bed without using bedrails?: A Little Help needed moving to and from a bed to a chair (including a wheelchair)?: A Little Help needed standing up from a chair using your arms (e.g., wheelchair or bedside chair)?: A Little Help needed to walk in hospital room?: A Lot Help needed climbing 3-5 steps with a railing? : A Lot 6 Click Score: 16    End of Session Equipment Utilized During Treatment: Gait belt;Oxygen Activity Tolerance: Patient tolerated treatment well;Patient limited by fatigue Patient left: in chair;with call bell/phone within reach;with chair alarm set Nurse Communication: Mobility status PT Visit Diagnosis:  Other abnormalities of gait and mobility (R26.89);Muscle weakness (generalized) (M62.81)     Time: 9833-8250 PT Time Calculation (min) (ACUTE ONLY): 16 min  Charges:  $Therapeutic Activity: 8-22 mins                    Mabeline Caras, PT, DPT Acute Rehabilitation Services  Pager [phone] Office [place] 07/29/2020, 11:02 AM

## 2020-07-30 ENCOUNTER — Inpatient Hospital Stay (HOSPITAL_COMMUNITY): Payer: Medicare HMO

## 2020-07-30 DIAGNOSIS — U071 COVID-19: Secondary | ICD-10-CM | POA: Diagnosis not present

## 2020-07-30 DIAGNOSIS — I4891 Unspecified atrial fibrillation: Secondary | ICD-10-CM | POA: Diagnosis not present

## 2020-07-30 DIAGNOSIS — N179 Acute kidney failure, unspecified: Secondary | ICD-10-CM | POA: Diagnosis not present

## 2020-07-30 DIAGNOSIS — J9601 Acute respiratory failure with hypoxia: Secondary | ICD-10-CM | POA: Diagnosis not present

## 2020-07-30 LAB — CULTURE, BLOOD (SINGLE)
Culture: NO GROWTH
Special Requests: ADEQUATE

## 2020-07-30 LAB — CBC WITH DIFFERENTIAL/PLATELET
Abs Immature Granulocytes: 0.54 10*3/uL — ABNORMAL HIGH (ref 0.00–0.07)
Basophils Absolute: 0 10*3/uL (ref 0.0–0.1)
Basophils Relative: 0 %
Eosinophils Absolute: 0 10*3/uL (ref 0.0–0.5)
Eosinophils Relative: 0 %
HCT: 26.8 % — ABNORMAL LOW (ref 36.0–46.0)
Hemoglobin: 8.5 g/dL — ABNORMAL LOW (ref 12.0–15.0)
Immature Granulocytes: 2 %
Lymphocytes Relative: 4 %
Lymphs Abs: 0.9 10*3/uL (ref 0.7–4.0)
MCH: 21.4 pg — ABNORMAL LOW (ref 26.0–34.0)
MCHC: 31.7 g/dL (ref 30.0–36.0)
MCV: 67.3 fL — ABNORMAL LOW (ref 80.0–100.0)
Monocytes Absolute: 1.3 10*3/uL — ABNORMAL HIGH (ref 0.1–1.0)
Monocytes Relative: 5 %
Neutro Abs: 20.4 10*3/uL — ABNORMAL HIGH (ref 1.7–7.7)
Neutrophils Relative %: 89 %
Platelets: 425 10*3/uL — ABNORMAL HIGH (ref 150–400)
RBC: 3.98 MIL/uL (ref 3.87–5.11)
RDW: 15.3 % (ref 11.5–15.5)
WBC: 23.1 10*3/uL — ABNORMAL HIGH (ref 4.0–10.5)
nRBC: 0.3 % — ABNORMAL HIGH (ref 0.0–0.2)

## 2020-07-30 LAB — CULTURE, BLOOD (ROUTINE X 2)
Culture: NO GROWTH
Culture: NO GROWTH
Special Requests: ADEQUATE

## 2020-07-30 LAB — COMPREHENSIVE METABOLIC PANEL
ALT: 19 U/L (ref 0–44)
AST: 32 U/L (ref 15–41)
Albumin: 2.8 g/dL — ABNORMAL LOW (ref 3.5–5.0)
Alkaline Phosphatase: 61 U/L (ref 38–126)
Anion gap: 20 — ABNORMAL HIGH (ref 5–15)
BUN: 85 mg/dL — ABNORMAL HIGH (ref 8–23)
CO2: 19 mmol/L — ABNORMAL LOW (ref 22–32)
Calcium: 8.8 mg/dL — ABNORMAL LOW (ref 8.9–10.3)
Chloride: 99 mmol/L (ref 98–111)
Creatinine, Ser: 6.18 mg/dL — ABNORMAL HIGH (ref 0.44–1.00)
GFR, Estimated: 6 mL/min — ABNORMAL LOW (ref >60)
Glucose, Bld: 99 mg/dL (ref 70–99)
Potassium: 3.5 mmol/L (ref 3.5–5.1)
Sodium: 138 mmol/L (ref 135–145)
Total Bilirubin: 0.8 mg/dL (ref 0.3–1.2)
Total Protein: 7.3 g/dL (ref 6.5–8.1)

## 2020-07-30 LAB — MAGNESIUM: Magnesium: 1.7 mg/dL (ref 1.7–2.4)

## 2020-07-30 LAB — D-DIMER, QUANTITATIVE: D-Dimer, Quant: 2.02 ug/mL-FEU — ABNORMAL HIGH (ref 0.00–0.50)

## 2020-07-30 LAB — GLUCOSE, CAPILLARY
Glucose-Capillary: 218 mg/dL — ABNORMAL HIGH (ref 70–99)
Glucose-Capillary: 67 mg/dL — ABNORMAL LOW (ref 70–99)
Glucose-Capillary: 127 mg/dL — ABNORMAL HIGH (ref 70–99)
Glucose-Capillary: 93 mg/dL (ref 70–99)
Glucose-Capillary: 73 mg/dL (ref 70–99)
Glucose-Capillary: 154 mg/dL — ABNORMAL HIGH (ref 70–99)
Glucose-Capillary: 129 mg/dL — ABNORMAL HIGH (ref 70–99)

## 2020-07-30 LAB — RETICULOCYTES
Immature Retic Fract: 19.2 % — ABNORMAL HIGH (ref 2.3–15.9)
RBC.: 3.85 MIL/uL — ABNORMAL LOW (ref 3.87–5.11)
Retic Count, Absolute: 58.5 10*3/uL (ref 19.0–186.0)
Retic Ct Pct: 1.5 % (ref 0.4–3.1)

## 2020-07-30 LAB — IRON AND TIBC
Iron: 124 ug/dL (ref 28–170)
Saturation Ratios: 87 % — ABNORMAL HIGH (ref 10.4–31.8)
TIBC: 143 ug/dL — ABNORMAL LOW (ref 250–450)
UIBC: 19 ug/dL

## 2020-07-30 LAB — ECHOCARDIOGRAM LIMITED
AV Peak grad: 9.2 mmHg
Ao pk vel: 1.52 m/s
Area-P 1/2: 7.44 cm2
Height: 61 in
S' Lateral: 2.5 cm
Weight: 2416.24 oz

## 2020-07-30 LAB — PROCALCITONIN: Procalcitonin: 0.52 ng/mL

## 2020-07-30 LAB — HEPARIN LEVEL (UNFRACTIONATED): Heparin Unfractionated: 0.39 IU/mL (ref 0.30–0.70)

## 2020-07-30 LAB — C-REACTIVE PROTEIN: CRP: 5.7 mg/dL — ABNORMAL HIGH (ref <1.0)

## 2020-07-30 LAB — LACTATE DEHYDROGENASE: LDH: 339 U/L — ABNORMAL HIGH (ref 98–192)

## 2020-07-30 LAB — VITAMIN B12: Vitamin B-12: 1205 pg/mL — ABNORMAL HIGH (ref 180–914)

## 2020-07-30 LAB — PHOSPHORUS: Phosphorus: 4.6 mg/dL (ref 2.5–4.6)

## 2020-07-30 LAB — FOLATE: Folate: 14.2 ng/mL (ref >5.9)

## 2020-07-30 LAB — FERRITIN: Ferritin: 2574 ng/mL — ABNORMAL HIGH (ref 11–307)

## 2020-07-30 MED ORDER — HALOPERIDOL LACTATE 5 MG/ML IJ SOLN
2.0000 mg | Freq: Once | INTRAMUSCULAR | Status: DC
  Filled 2020-07-30: qty 1

## 2020-07-30 MED ORDER — LORAZEPAM 2 MG/ML IJ SOLN
0.5000 mg | Freq: Once | INTRAMUSCULAR | Status: AC
  Administered 2020-07-30: 0.5 mg via INTRAVENOUS
  Filled 2020-07-30: qty 1

## 2020-07-30 MED ORDER — CHLORHEXIDINE GLUCONATE CLOTH 2 % EX PADS
6.0000 | MEDICATED_PAD | Freq: Every day | CUTANEOUS | Status: DC
  Administered 2020-07-31 – 2020-08-01 (×2): 6 via TOPICAL

## 2020-07-30 NOTE — Progress Notes (Signed)
PROGRESS NOTE    [name] [name]  WUJ:[id number] DOB: 1935-09-03 DOA: 07/25/2020 PCP: [name], MD     Brief Narrative:  [name] [name] is an 83 year old female with history of RA, CKD 4, DM 2, HTN, HLD, OSA on CPAP, TIA, chronic diastolic CHF.   To the hospital with 5 days of cough, lethargy, confusion. Was found to be hypoxic, tested positive for Covid and was admitted to the hospital. She was also found to have acute kidney injury. She was also found to have A. fib with RVR. Cardiology and nephrology were consulted on admission. Renal function has not improved and the patient is transferred to [facility] in anticipation of dialysis initiation.   Subjective: 12/23 somnolent responds only to painful stimuli (patient had first HD session last night.).   Assessment & Plan: Covid vaccination; unvaccinated   Principal Problem:   Acute hypoxemic respiratory failure due to COVID-19 [facility]) Active Problems:   Essential hypertension   HLD (hyperlipidemia)   DM (diabetes mellitus), type 2 with renal complications (HCC)   Chronic diastolic HF (heart failure) ([place])   CAP (community acquired pneumonia)   CKD (chronic kidney disease), stage IV ([place])   AKI (acute kidney injury) ([place])   Sepsis ([place])   Atrial fibrillation with RVR ([place])  Acute respiratory failure with hypoxia/Covid pneumonia/suspected bacterial superinfection COVID-19 Labs  Recent Labs    07/28/20 0210 07/29/20 0147 07/30/20 0500  DDIMER 0.98* 1.31* 2.02*  FERRITIN 2,391* 2,260* 2,574*  LDH  --  271* 339*  CRP 9.0* 6.9* 5.7*    Lab Results  Component Value Date   SARSCOV2NAA POSITIVE (A) 07/25/2020  -Patient with CrCl<70m/min therefore and eligible for Remdesivir or Baricitinib -In addition possible bacterial superinfection initial procalcitonin=Results for LTYSHAY, ADEE(MRN [id number] as of 07/28/2020 19:45  Ref. Range 07/25/2020 08:40  Procalcitonin Latest Units: ng/mL 2.35   -Trend procalcitonin -Cultures NGTD -12/22 spoke with pharmacy all antibiotics have been completed.    Sepsis pneumonia (Covid 19/suspected CAP) -On admission criteria for sepsis tachycardia, tachypnea, leukocytosis -Cultures obtained, placed on antibiotics.  All physiologic signs of sepsis have resolved.  Elevated D-dimer -Unsure if elevation secondary to worsening disease process for secondary to patient's fluid contraction.  Patient just had HD last night.  Currently no increase in O2 demand. -If D-dimer continues to increase will obtain CTA chest after consulting with nephrologist (patient already on HD if they do not believe patient's kidneys recoverable will proceed).  Otherwise may just treat empirically because currently patient would not be able to cooperate with VQ scan  Acute on CKD stage IV (baseline Cr 2.4-2.7) -On presentation Cr 6.8 not improving -[facility] -Does not currently fluid appear overloaded.  Anion gap metabolic acidosis -Worsening anion gap metabolic acidosis -157/841 amp sodium bicarb -12/22 nontunneled HD cath placed  Chronic diastolic CHF -BNP on admission= 259 -Strict in and out +4.4 L -Daily weight Filed Weights   07/29/20 0420 07/29/20 2235 07/30/20 0124  Weight: 69.6 kg 68.5 kg 68.5 kg  -12/22 Metoprolol 12.5 mg BID Hold for MAP<60  A. fib with RVR -CHADS2Vasc score is 7 (HTN, age x 2, DM, TIA x2, gender), Can convert to DAshlandlater in admission -Cardiology consulted -Continue Amiodarone drip -12/22 currently sinus arrhythmia currently NSR -Continue heparin drip -Last seen by cardiology on 12/18 per their note would consider outpatient monitor after systemic illness have resolved. -Cardiology recommends echocardiogram once rate better controlled. -12/22 Echocardiogram pending  Essential HTN -See CHF  Acute metabolic encephalopathy -78/93 resolved answer questions appropriately follows all commands  Hx  TIA -Hold aspirin in the setting of heparin infusion -Lipitor 20 mg daily -12/22 LDL = 57: Goal LDL <70   DM type II uncontrolled with Hyperglycemia -12/22 Hemoglobin A1c= 7.7  -12/22 Levemir 7 units BID -12/22 NovoLog 5 units qac -12/22 moderate SSI  -Tradjenta 5 mg daily  Mixed Anemia -Anemia panel most consistent with a mixed anemia -Occult blood pending      DVT prophylaxis: Heparin drip Code Status: Full Family Communication:  Status is: Inpatient    Dispo: The patient is from:               Anticipated d/c is to: Home              Anticipated d/c date is: 12/25              Patient currently unstable       Consultants:  Nephrology Cardiology  Procedures/Significant Events:  12/22 nontunneled HD catheter terminating within the superior aspect RIGHT atrium   I have personally reviewed and interpreted all radiology studies and my findings are as above.  VENTILATOR SETTINGS: HFNC 12/23 Flow; 7 L/min SPO2; 90%   Cultures 12/18 blood RIGHT AC negative 12/18 MRSA by PCR negative 12/18 blood right hand negative final x2    Antimicrobials: Anti-infectives (From admission, onward)   Start     Ordered Stop   07/26/20 1000  cefTRIAXone (ROCEPHIN) 1 g in sodium chloride 0.9 % 100 mL IVPB        07/25/20 1138 07/30/20 0959   07/26/20 1000  doxycycline (VIBRAMYCIN) 100 mg in sodium chloride 0.9 % 250 mL IVPB        07/25/20 1138 07/27/20 2335   07/25/20 0500  levofloxacin (LEVAQUIN) IVPB 750 mg        07/25/20 0453 07/25/20 0824         Devices    LINES / TUBES:      Continuous Infusions: . amiodarone 30 mg/hr (07/30/20 0440)  . diltiazem (CARDIZEM) infusion Stopped (07/27/20 0233)  . heparin       Objective: Vitals:   07/30/20 0150 07/30/20 0312 07/30/20 0725 07/30/20 1146  BP: (!) 164/72 (!) 141/85 (!) 148/66 (!) 160/98  Pulse: 93 100 90 98  Resp:  '18 19 20  ' Temp: 97.6 F (36.4 C) 98.1 F (36.7 C) 98.1 F (36.7 C) 99 F (37.2  C)  TempSrc:  Oral Axillary Axillary  SpO2: 90% 90% 95% 93%  Weight:      Height:        Intake/Output Summary (Last 24 hours) at 07/30/2020 1234 Last data filed at 07/30/2020 0935 Gross per 24 hour  Intake 0 ml  Output 0 ml  Net 0 ml   Filed Weights   07/29/20 0420 07/29/20 2235 07/30/20 0124  Weight: 69.6 kg 68.5 kg 68.5 kg   Physical Exam:  General: Somnolent, difficult to arouse responds only to painful stimuli, positive acute respiratory distress Eyes: negative scleral hemorrhage, negative anisocoria, negative icterus ENT: Negative Runny nose, negative gingival bleeding, Neck:  Negative scars, masses, torticollis, lymphadenopathy, JVD Lungs: Clear to auscultation bilaterally without wheezes or crackles, right chest wall nontender HD cath in place negative sign of bleeding or infection Cardiovascular: Regular rate and rhythm without murmur gallop or rub normal S1 and S2 Abdomen: negative abdominal pain, nondistended, positive soft, bowel sounds, no rebound, no ascites, no appreciable mass Extremities: No significant cyanosis, clubbing, or  edema bilateral lower extremities Skin: Negative rashes, lesions, ulcers Psychiatric: Unable to evaluate secondary to patient's somnolence Central nervous system: Withdraws to painful stimuli .     Data Reviewed: Care during the described time interval was provided by me .  I have reviewed this patient's available data, including medical history, events of note, physical examination, and all test results as part of my evaluation.  CBC: Recent Labs  Lab 07/26/20 0741 07/27/20 0605 07/28/20 0210 07/29/20 0147 07/30/20 0500  WBC 11.9* 13.0* 9.6 11.9* 23.1*  NEUTROABS 10.9* 12.0* 9.0* 11.2* 20.4*  HGB 8.5* 8.6* 8.2* 7.7* 8.5*  HCT 26.7* 27.5* 25.9* 23.2* 26.8*  MCV 69.0* 70.0* 68.7* 67.1* 67.3*  PLT 312 402* 383 393 010*   Basic Metabolic Panel: Recent Labs  Lab 07/25/20 1113 07/26/20 0741 07/27/20 0605 07/28/20 0210  07/29/20 0147 07/30/20 0500  NA  --  132* 136 135 137 138  K  --  3.9 3.8 4.1 4.0 3.5  CL  --  99 102 104 104 99  CO2  --  13* 16* 15* 15* 19*  GLUCOSE  --  312* 222* 205* 233* 99  BUN  --  96* 99* 116* 137* 85*  CREATININE  --  6.63* 6.95* 7.68* 8.56* 6.18*  CALCIUM  --  8.6* 8.7* 8.7* 8.8* 8.8*  MG 2.0  --   --   --  2.1 1.7  PHOS  --   --   --   --  6.3* 4.6   GFR: Estimated Creatinine Clearance: 6.2 mL/min (A) (by C-G formula based on SCr of 6.18 mg/dL (H)). Liver Function Tests: Recent Labs  Lab 07/26/20 0741 07/27/20 0605 07/28/20 0210 07/29/20 0147 07/30/20 0500  AST 40 37 24 21 32  ALT '19 19 16 17 19  ' ALKPHOS 60 60 55 56 61  BILITOT 0.8 0.5 0.5 0.8 0.8  PROT 8.0 7.9 6.7 6.6 7.3  ALBUMIN 2.8* 2.8* 2.3* 2.5* 2.8*   No results for input(s): LIPASE, AMYLASE in the last 168 hours. No results for input(s): AMMONIA in the last 168 hours. Coagulation Profile: Recent Labs  Lab 07/25/20 1113  INR 1.2   Cardiac Enzymes: No results for input(s): CKTOTAL, CKMB, CKMBINDEX, TROPONINI in the last 168 hours. BNP (last 3 results) No results for input(s): PROBNP in the last 8760 hours. HbA1C: Recent Labs    07/29/20 0147  HGBA1C 8.0*   CBG: Recent Labs  Lab 07/29/20 2040 07/30/20 0401 07/30/20 0527 07/30/20 0730 07/30/20 1145  GLUCAP 125* 67* 127* 93 73   Lipid Profile: Recent Labs    07/29/20 0147  CHOL 127  HDL 54  LDLCALC 57  TRIG 81  CHOLHDL 2.4   Thyroid Function Tests: No results for input(s): TSH, T4TOTAL, FREET4, T3FREE, THYROIDAB in the last 72 hours. Anemia Panel: Recent Labs    07/29/20 0147 07/30/20 0500  VITAMINB12  --  1,205*  FOLATE  --  14.2  FERRITIN 2,260* 2,574*  TIBC  --  143*  IRON  --  124  RETICCTPCT  --  1.5   Sepsis Labs: Recent Labs  Lab 07/25/20 0300 07/25/20 0840 07/28/20 2043 07/29/20 0147 07/30/20 0500  PROCALCITON  --  2.35 0.96 0.84 0.52  LATICACIDVEN 1.3  --   --   --   --     Recent Results (from the  past 240 hour(s))  Resp Panel by RT-PCR (Flu A&B, Covid) Nasopharyngeal Swab     Status: Abnormal   Collection Time: 07/25/20  1:56 AM   Specimen: Nasopharyngeal Swab; Nasopharyngeal(NP) swabs in vial transport medium  Result Value Ref Range Status   SARS Coronavirus 2 by RT PCR POSITIVE (A) NEGATIVE Final    Comment: CRITICAL RESULT CALLED TO, READ BACK BY AND VERIFIED WITH: Burnis Medin RN 07/25/20 '@0603'  BY P.HENDERSON (NOTE) SARS-CoV-2 target nucleic acids are DETECTED.  The SARS-CoV-2 RNA is generally detectable in upper respiratory specimens during the acute phase of infection. Positive results are indicative of the presence of the identified virus, but do not rule out bacterial infection or co-infection with other pathogens not detected by the test. Clinical correlation with patient history and other diagnostic information is necessary to determine patient infection status. The expected result is Negative.  Fact Sheet for Patients: EntrepreneurPulse.com.au  Fact Sheet for Healthcare Providers: IncredibleEmployment.be  This test is not yet approved or cleared by the [place] FDA and  has been authorized for detection and/or diagnosis of SARS-CoV-2 by FDA under an Emergency Use Authorization (EUA).  This EUA will remain in effect (me aning this test can be used) for the duration of  the COVID-19 declaration under Section 564(b)(1) of the Act, 21 U.S.C. section 360bbb-3(b)(1), unless the authorization is terminated or revoked sooner.     Influenza A by PCR NEGATIVE NEGATIVE Final   Influenza B by PCR NEGATIVE NEGATIVE Final    Comment: (NOTE) The Xpert Xpress SARS-CoV-2/FLU/RSV plus assay is intended as an aid in the diagnosis of influenza from Nasopharyngeal swab specimens and should not be used as a sole basis for treatment. Nasal washings and aspirates are unacceptable for Xpert Xpress SARS-CoV-2/FLU/RSV testing.  Fact Sheet  for Patients: EntrepreneurPulse.com.au  Fact Sheet for Healthcare Providers: IncredibleEmployment.be  This test is not yet approved or cleared by the [place] FDA and has been authorized for detection and/or diagnosis of SARS-CoV-2 by FDA under an Emergency Use Authorization (EUA). This EUA will remain in effect (meaning this test can be used) for the duration of the COVID-19 declaration under Section 564(b)(1) of the Act, 21 U.S.C. section 360bbb-3(b)(1), unless the authorization is terminated or revoked.  Performed at [facility], [place] [address] [place], [place] [address]   Culture, blood (single)     Status: None   Collection Time: 07/25/20  6:19 AM   Specimen: BLOOD  Result Value Ref Range Status   Specimen Description   Final    BLOOD RIGHT ANTECUBITAL Performed at [facility] [address] [place], [place] [address]    Special Requests   Final    BOTTLES DRAWN AEROBIC AND ANAEROBIC Blood Culture adequate volume Performed at [facility] [address] [place], [place] [address]    Culture   Final    NO GROWTH 5 DAYS Performed at [facility], [place] [address], [place], [place] [address]    Report Status 07/30/2020 FINAL  Final  MRSA PCR Screening     Status: None   Collection Time: 07/25/20 11:58 AM   Specimen: Nasopharyngeal  Result Value Ref Range Status   MRSA by PCR NEGATIVE NEGATIVE Final    Comment:        The GeneXpert MRSA Assay (FDA approved for NASAL specimens only), is one component of a comprehensive MRSA colonization surveillance program. It is not intended to diagnose MRSA infection nor to guide or monitor treatment for MRSA infections. Performed at [facility], [place] [address] [place], [place] [address]   Blood Culture (routine x  2)     Status: None   Collection Time: 07/25/20  4:18 PM   Specimen: BLOOD  Result  Value Ref Range Status   Specimen Description   Final    BLOOD RIGHT HAND Performed at [facility] [address], [place], [place] [address]    Special Requests   Final    BOTTLES DRAWN AEROBIC ONLY Blood Culture results may not be optimal due to an inadequate volume of blood received in culture bottles Performed at [facility] [address], [place], [place] [address]    Culture   Final    NO GROWTH 5 DAYS Performed at [facility], [place] [address] [place], [place] [address]    Report Status 07/30/2020 FINAL  Final  Blood Culture (routine x 2)     Status: None   Collection Time: 07/25/20  4:18 PM   Specimen: BLOOD  Result Value Ref Range Status   Specimen Description   Final    BLOOD RIGHT HAND Performed at [facility] [address], [place], [place] [address]    Special Requests   Final    BOTTLES DRAWN AEROBIC ONLY Blood Culture adequate volume Performed at [facility] [address], [place], [place] [address]    Culture   Final    NO GROWTH 5 DAYS Performed at [facility], [place] [address] [place], [place] [address]    Report Status 07/30/2020 FINAL  Final         Radiology Studies: IR Fluoro Guide CV Line Right  Result Date: 07/29/2020 INDICATION: History of COVID now with acute renal insufficiency. Please perform placement of a non tunneled hemodialysis catheter for the initiation of dialysis. EXAM: NON-TUNNELED CENTRAL VENOUS HEMODIALYSIS CATHETER PLACEMENT WITH ULTRASOUND AND FLUOROSCOPIC GUIDANCE COMPARISON:  None. MEDICATIONS: None, the patient is currently admitted to the hospital receiving intravenous antibiotics; The antibiotic was administered within an appropriate time interval prior to skin puncture. FLUOROSCOPY TIME:  30 seconds COMPLICATIONS: None immediate. PROCEDURE: Informed written consent was obtained from the patient after a discussion of the risks,  benefits, and alternatives to treatment. Questions regarding the procedure were encouraged and answered. The right neck and chest were prepped with chlorhexidine in a sterile fashion, and a sterile drape was applied covering the operative field. Maximum barrier sterile technique with sterile gowns and gloves were used for the procedure. A timeout was performed prior to the initiation of the procedure. After the overlying soft tissues were anesthetized, a small venotomy incision was created and a micropuncture kit was utilized to access the internal jugular vein. Real-time ultrasound guidance was utilized for vascular access including the acquisition of a permanent ultrasound image documenting patency of the accessed vessel. The microwire was utilized to measure appropriate catheter length. A stiff glidewire was advanced to the level of the IVC. Under fluoroscopic guidance, the venotomy was serially dilated, ultimately allowing placement of a 16 cm temporary [place] catheter with tip ultimately terminating within the superior aspect of the right atrium. Final catheter positioning was confirmed and documented with a spot radiographic image. The catheter aspirates and flushes normally. The catheter was flushed with appropriate volume heparin dwells. The catheter exit site was secured with a 0-Prolene retention suture. A dressing was applied. The patient tolerated the procedure well without immediate post procedural complication. IMPRESSION: Successful placement of a right internal jugular approach 16 cm temporary dialysis catheter with tip terminating with in the superior aspect of  the right atrium. The catheter is ready for immediate use. PLAN: This catheter may be converted to a tunneled dialysis catheter at a later date as indicated. Electronically Signed   By: Sandi Mariscal M.D.   On: 07/29/2020 14:57   IR US Guide Vasc Access Right  Result Date: 07/29/2020 INDICATION: History of COVID now with acute renal  insufficiency. Please perform placement of a non tunneled hemodialysis catheter for the initiation of dialysis. EXAM: NON-TUNNELED CENTRAL VENOUS HEMODIALYSIS CATHETER PLACEMENT WITH ULTRASOUND AND FLUOROSCOPIC GUIDANCE COMPARISON:  None. MEDICATIONS: None, the patient is currently admitted to the hospital receiving intravenous antibiotics; The antibiotic was administered within an appropriate time interval prior to skin puncture. FLUOROSCOPY TIME:  30 seconds COMPLICATIONS: None immediate. PROCEDURE: Informed written consent was obtained from the patient after a discussion of the risks, benefits, and alternatives to treatment. Questions regarding the procedure were encouraged and answered. The right neck and chest were prepped with chlorhexidine in a sterile fashion, and a sterile drape was applied covering the operative field. Maximum barrier sterile technique with sterile gowns and gloves were used for the procedure. A timeout was performed prior to the initiation of the procedure. After the overlying soft tissues were anesthetized, a small venotomy incision was created and a micropuncture kit was utilized to access the internal jugular vein. Real-time ultrasound guidance was utilized for vascular access including the acquisition of a permanent ultrasound image documenting patency of the accessed vessel. The microwire was utilized to measure appropriate catheter length. A stiff glidewire was advanced to the level of the IVC. Under fluoroscopic guidance, the venotomy was serially dilated, ultimately allowing placement of a 16 cm temporary Mah[place]kar catheter with tip ultimately terminating within the superior aspect of the right atrium. Final catheter positioning was confirmed and documented with a spot radiographic image. The catheter aspirates and flushes normally. The catheter was flushed with appropriate volume heparin dwells. The catheter exit site was secured with a 0-Prolene retention suture. A dressing was  applied. The patient tolerated the procedure well without immediate post procedural complication. IMPRESSION: Successful placement of a right internal jugular approach 16 cm temporary dialysis catheter with tip terminating with in the superior aspect of the right atrium. The catheter is ready for immediate use. PLAN: This catheter may be converted to a tunneled dialysis catheter at a later date as indicated. Electronically Signed   By: Sandi Mariscal M.D.   On: 07/29/2020 14:57        Scheduled Meds: . amiodarone  150 mg Intravenous Once  . amLODipine  10 mg Oral Daily  . atorvastatin  20 mg Oral q1800  . chlorhexidine  15 mL Mouth Rinse BID  . Chlorhexidine Gluconate Cloth  6 each Topical Daily  . Chlorhexidine Gluconate Cloth  6 each Topical Q0600  . cloNIDine  0.1 mg Oral TID  . haloperidol lactate  2 mg Intravenous Once  . insulin aspart  0-15 Units Subcutaneous Q4H  . insulin aspart  5 Units Subcutaneous TID WC  . insulin detemir  7 Units Subcutaneous BID  . Ipratropium-Albuterol  1 puff Inhalation BID  . linagliptin  5 mg Oral Daily  . mouth rinse  15 mL Mouth Rinse BID  . mouth rinse  15 mL Mouth Rinse q12n4p  . methylPREDNISolone (SOLU-MEDROL) injection  40 mg Intravenous Q12H  . metoprolol tartrate  12.5 mg Oral BID  . sodium bicarbonate  650 mg Oral TID  . sodium chloride flush  10-40 mL Intracatheter Q12H  . sodium  chloride flush  3 mL Intravenous Q12H   Continuous Infusions: . amiodarone 30 mg/hr (07/30/20 0440)  . diltiazem (CARDIZEM) infusion Stopped (07/27/20 0233)  . heparin       LOS: 5 days    Time spent:40 min    Christel Bai, Geraldo Docker, MD [facility] Pager [phone]  If 7PM-7AM, please contact night-coverage [email or web address] Password [facility] 07/30/2020, 12:34 PM

## 2020-07-30 NOTE — Progress Notes (Signed)
  Echocardiogram 2D Echocardiogram has been performed.  [name] [name] 07/30/2020, 3:22 PM

## 2020-07-30 NOTE — Progress Notes (Addendum)
Patient ID: [name] [name], female   DOB: 1936/04/14, 84 y.o.   MRN: [id number]   S: Completed first HD overnight- tolerated well hemodynamically- no volume removed- no UOP recorded   O:BP (!) 148/66 (BP Location: Left Arm)   Pulse 90   Temp 98.1 F (36.7 C) (Axillary)   Resp 19   Ht '5\' 1"'  (1.549 m)   Wt 68.5 kg   SpO2 95%   BMI 28.53 kg/m   Intake/Output Summary (Last 24 hours) at 07/30/2020 1110 Last data filed at 07/30/2020 0935 Gross per 24 hour  Intake 60 ml  Output 0 ml  Net 60 ml   Intake/Output: I/O last 3 completed shifts: In: 708.3 [P.O.:60; I.V.:648.3] Out: 600 [Urine:600]  Intake/Output this shift:  No intake/output data recorded. Weight change: -1.1 kg Gen: NAD CVS: RRR no rub Resp: cta  Abd: +BS, soft, NT/ND Ext: no edema  Recent Labs  Lab 07/25/20 0300 07/25/20 0840 07/26/20 0741 07/27/20 0605 07/28/20 0210 07/29/20 0147 07/30/20 0500  NA 135 135 132* 136 135 137 138  K 3.6 3.5 3.9 3.8 4.1 4.0 3.5  CL 99 101 99 102 104 104 99  CO2 18* 18* 13* 16* 15* 15* 19*  GLUCOSE 219* 124* 312* 222* 205* 233* 99  BUN 101* 93* 96* 99* 116* 137* 85*  CREATININE 6.87* 6.04* 6.63* 6.95* 7.68* 8.56* 6.18*  ALBUMIN 2.8* 2.7* 2.8* 2.8* 2.3* 2.5* 2.8*  CALCIUM 7.9* 7.9* 8.6* 8.7* 8.7* 8.8* 8.8*  PHOS  --   --   --   --   --  6.3* 4.6  AST 34 35 40 37 24 21 32  ALT '16 15 19 19 16 17 19   ' Liver Function Tests: Recent Labs  Lab 07/28/20 0210 07/29/20 0147 07/30/20 0500  AST 24 21 32  ALT '16 17 19  ' ALKPHOS 55 56 61  BILITOT 0.5 0.8 0.8  PROT 6.7 6.6 7.3  ALBUMIN 2.3* 2.5* 2.8*   No results for input(s): LIPASE, AMYLASE in the last 168 hours. No results for input(s): AMMONIA in the last 168 hours. CBC: Recent Labs  Lab 07/26/20 0741 07/27/20 0605 07/28/20 0210 07/29/20 0147 07/30/20 0500  WBC 11.9* 13.0* 9.6 11.9* 23.1*  NEUTROABS 10.9* 12.0* 9.0* 11.2* 20.4*  HGB 8.5* 8.6* 8.2* 7.7* 8.5*  HCT 26.7* 27.5* 25.9* 23.2* 26.8*  MCV 69.0*  70.0* 68.7* 67.1* 67.3*  PLT 312 402* 383 393 425*   Cardiac Enzymes: No results for input(s): CKTOTAL, CKMB, CKMBINDEX, TROPONINI in the last 168 hours. CBG: Recent Labs  Lab 07/29/20 1746 07/29/20 2040 07/30/20 0401 07/30/20 0527 07/30/20 0730  GLUCAP 172* 125* 67* 127* 93    Iron Studies:  Recent Labs    07/30/20 0500  IRON 124  TIBC 143*  FERRITIN 2,574*   Studies/Results: IR Fluoro Guide CV Line Right  Result Date: 07/29/2020 INDICATION: History of COVID now with acute renal insufficiency. Please perform placement of a non tunneled hemodialysis catheter for the initiation of dialysis. EXAM: NON-TUNNELED CENTRAL VENOUS HEMODIALYSIS CATHETER PLACEMENT WITH ULTRASOUND AND FLUOROSCOPIC GUIDANCE COMPARISON:  None. MEDICATIONS: None, the patient is currently admitted to the hospital receiving intravenous antibiotics; The antibiotic was administered within an appropriate time interval prior to skin puncture. FLUOROSCOPY TIME:  30 seconds COMPLICATIONS: None immediate. PROCEDURE: Informed written consent was obtained from the patient after a discussion of the risks, benefits, and alternatives to treatment. Questions regarding the procedure were encouraged and answered. The right neck and chest were prepped with chlorhexidine  in a sterile fashion, and a sterile drape was applied covering the operative field. Maximum barrier sterile technique with sterile gowns and gloves were used for the procedure. A timeout was performed prior to the initiation of the procedure. After the overlying soft tissues were anesthetized, a small venotomy incision was created and a micropuncture kit was utilized to access the internal jugular vein. Real-time ultrasound guidance was utilized for vascular access including the acquisition of a permanent ultrasound image documenting patency of the accessed vessel. The microwire was utilized to measure appropriate catheter length. A stiff glidewire was advanced to the  level of the IVC. Under fluoroscopic guidance, the venotomy was serially dilated, ultimately allowing placement of a 16 cm temporary Mahurkar catheter with tip ultimately terminating within the superior aspect of the right atrium. Final catheter positioning was confirmed and documented with a spot radiographic image. The catheter aspirates and flushes normally. The catheter was flushed with appropriate volume heparin dwells. The catheter exit site was secured with a 0-Prolene retention suture. A dressing was applied. The patient tolerated the procedure well without immediate post procedural complication. IMPRESSION: Successful placement of a right internal jugular approach 16 cm temporary dialysis catheter with tip terminating with in the superior aspect of the right atrium. The catheter is ready for immediate use. PLAN: This catheter may be converted to a tunneled dialysis catheter at a later date as indicated. Electronically Signed   By: Sandi Mariscal M.D.   On: 07/29/2020 14:57   IR US Guide Vasc Access Right  Result Date: 07/29/2020 INDICATION: History of COVID now with acute renal insufficiency. Please perform placement of a non tunneled hemodialysis catheter for the initiation of dialysis. EXAM: NON-TUNNELED CENTRAL VENOUS HEMODIALYSIS CATHETER PLACEMENT WITH ULTRASOUND AND FLUOROSCOPIC GUIDANCE COMPARISON:  None. MEDICATIONS: None, the patient is currently admitted to the hospital receiving intravenous antibiotics; The antibiotic was administered within an appropriate time interval prior to skin puncture. FLUOROSCOPY TIME:  30 seconds COMPLICATIONS: None immediate. PROCEDURE: Informed written consent was obtained from the patient after a discussion of the risks, benefits, and alternatives to treatment. Questions regarding the procedure were encouraged and answered. The right neck and chest were prepped with chlorhexidine in a sterile fashion, and a sterile drape was applied covering the operative field.  Maximum barrier sterile technique with sterile gowns and gloves were used for the procedure. A timeout was performed prior to the initiation of the procedure. After the overlying soft tissues were anesthetized, a small venotomy incision was created and a micropuncture kit was utilized to access the internal jugular vein. Real-time ultrasound guidance was utilized for vascular access including the acquisition of a permanent ultrasound image documenting patency of the accessed vessel. The microwire was utilized to measure appropriate catheter length. A stiff glidewire was advanced to the level of the IVC. Under fluoroscopic guidance, the venotomy was serially dilated, ultimately allowing placement of a 16 cm temporary Mahurkar catheter with tip ultimately terminating within the superior aspect of the right atrium. Final catheter positioning was confirmed and documented with a spot radiographic image. The catheter aspirates and flushes normally. The catheter was flushed with appropriate volume heparin dwells. The catheter exit site was secured with a 0-Prolene retention suture. A dressing was applied. The patient tolerated the procedure well without immediate post procedural complication. IMPRESSION: Successful placement of a right internal jugular approach 16 cm temporary dialysis catheter with tip terminating with in the superior aspect of the right atrium. The catheter is ready for immediate use. PLAN: This  catheter may be converted to a tunneled dialysis catheter at a later date as indicated. Electronically Signed   By: Sandi Mariscal M.D.   On: 07/29/2020 14:57   . amiodarone  150 mg Intravenous Once  . amLODipine  10 mg Oral Daily  . atorvastatin  20 mg Oral q1800  . chlorhexidine  15 mL Mouth Rinse BID  . Chlorhexidine Gluconate Cloth  6 each Topical Daily  . Chlorhexidine Gluconate Cloth  6 each Topical Q0600  . cloNIDine  0.1 mg Oral TID  . haloperidol lactate  2 mg Intravenous Once  . insulin aspart   0-15 Units Subcutaneous Q4H  . insulin aspart  5 Units Subcutaneous TID WC  . insulin detemir  7 Units Subcutaneous BID  . Ipratropium-Albuterol  1 puff Inhalation BID  . linagliptin  5 mg Oral Daily  . mouth rinse  15 mL Mouth Rinse BID  . mouth rinse  15 mL Mouth Rinse q12n4p  . methylPREDNISolone (SOLU-MEDROL) injection  40 mg Intravenous Q12H  . metoprolol tartrate  12.5 mg Oral BID  . sodium bicarbonate  650 mg Oral TID  . sodium chloride flush  10-40 mL Intracatheter Q12H  . sodium chloride flush  3 mL Intravenous Q12H    BMET    Component Value Date/Time   NA 138 07/30/2020 0500   NA 140 05/05/2020 1237   K 3.5 07/30/2020 0500   CL 99 07/30/2020 0500   CO2 19 (L) 07/30/2020 0500   GLUCOSE 99 07/30/2020 0500   BUN 85 (H) 07/30/2020 0500   BUN 43 (H) 05/05/2020 1237   CREATININE 6.18 (H) 07/30/2020 0500   CALCIUM 8.8 (L) 07/30/2020 0500   CALCIUM 8.8 01/15/2018 1005   GFRNONAA 6 (L) 07/30/2020 0500   GFRAA 18 (L) 05/05/2020 1237   CBC    Component Value Date/Time   WBC 23.1 (H) 07/30/2020 0500   RBC 3.98 07/30/2020 0500   RBC 3.85 (L) 07/30/2020 0500   HGB 8.5 (L) 07/30/2020 0500   HGB 9.1 (L) 01/30/2020 1558   HCT 26.8 (L) 07/30/2020 0500   HCT 30.6 (L) 01/30/2020 1558   PLT 425 (H) 07/30/2020 0500   PLT 251 01/30/2020 1558   MCV 67.3 (L) 07/30/2020 0500   MCV 72 (L) 01/30/2020 1558   MCH 21.4 (L) 07/30/2020 0500   MCHC 31.7 07/30/2020 0500   RDW 15.3 07/30/2020 0500   RDW 15.7 (H) 01/30/2020 1558   LYMPHSABS 0.9 07/30/2020 0500   MONOABS 1.3 (H) 07/30/2020 0500   EOSABS 0.0 07/30/2020 0500   BASOSABS 0.0 07/30/2020 0500   Assessment/Plan:  1. AKI on CKD stage IV-  In setting of sepsis due to covid-19 and CAP.  Baseline Scr 2.7-3.  Renal US without obstruction.  No improvement with IVF's and rising BUN/Cr.  her BUN was markedly elevated and her Scr continues to climb.  I did discuss her worsening renal function and dialysis.  She is amenable to HD if  needed. 1.  IR placed temporary HD catheter 12/22 followed by first HD overnight- numbers improved as to be expected-  No dialysis today-  Follow labs and UOP daily for additional HD need - suspect she may need tomorrow   2. Acute hypoxemic respiratory failure due to covid-19 and community acquired PNA- on IV steroids, but not on remdesivir due to AKI and no baricitinib due to bacterial infection. 3. Sepsis due to covid-19 and CAP- improving with antibiotics (rocephin and doxycycline) 4. Atrial fib with RVR- started  on amiodarone per Cardiology 5. Acute metabolic encephalopathy- likely multifactorial with AKI, covid-19, hypoxia, and sepsis.  Improving. 6. Anemia of acute illness and CKD stage IV- follow H/H and iron stores. hgb 8.5 today 7. AGMA- due to #1.  Start bicarb also HD-  stable 8. HTN- stable 9. DM type 2  - per primary.   Louis Meckel  [employer] [phone]

## 2020-07-30 NOTE — Plan of Care (Signed)
  Problem: Education: Goal: Knowledge of General Education information will improve Description: Including pain rating scale, medication(s)/side effects and non-pharmacologic comfort measures 07/30/2020 2031 by Dedra Skeens, RN Outcome: Progressing 07/30/2020 2030 by Dedra Skeens, RN Outcome: Progressing   Problem: Clinical Measurements: Goal: Ability to maintain clinical measurements within normal limits will improve 07/30/2020 2031 by Dedra Skeens, RN Outcome: Progressing 07/30/2020 2030 by Dedra Skeens, RN Outcome: Progressing Goal: Will remain free from infection 07/30/2020 2031 by Dedra Skeens, RN Outcome: Progressing 07/30/2020 2030 by Dedra Skeens, RN Outcome: Progressing Goal: Diagnostic test results will improve 07/30/2020 2031 by Dedra Skeens, RN Outcome: Progressing 07/30/2020 2030 by Dedra Skeens, RN Outcome: Progressing Goal: Respiratory complications will improve 07/30/2020 2031 by Dedra Skeens, RN Outcome: Progressing 07/30/2020 2030 by Dedra Skeens, RN Outcome: Progressing Goal: Cardiovascular complication will be avoided 07/30/2020 2031 by Dedra Skeens, RN Outcome: Progressing 07/30/2020 2030 by Dedra Skeens, RN Outcome: Progressing   Problem: Nutrition: Goal: Adequate nutrition will be maintained 07/30/2020 2031 by Dedra Skeens, RN Outcome: Progressing 07/30/2020 2030 by Dedra Skeens, RN Outcome: Progressing   Problem: Coping: Goal: Level of anxiety will decrease 07/30/2020 2031 by Dedra Skeens, RN Outcome: Progressing 07/30/2020 2030 by Dedra Skeens, RN Outcome: Progressing

## 2020-07-30 NOTE — Progress Notes (Signed)
[facility] for Heparin Indication: atrial fibrillation  Allergies  Allergen Reactions  . Penicillins Swelling and Rash    Patient Measurements: Height: 5\' 1"  (154.9 cm) Weight: 68.5 kg (151 lb 0.2 oz) IBW/kg (Calculated) : 47.8 Heparin Dosing Weight: 60 kg  Vital Signs: Temp: 98.1 F (36.7 C) (12/23 0725) Temp Source: Axillary (12/23 0725) BP: 148/66 (12/23 0725) Pulse Rate: 90 (12/23 0725)  Labs: Recent Labs    07/28/20 0210 07/28/20 1445 07/29/20 0030 07/29/20 0147 07/30/20 0500  HGB 8.2*  --   --  7.7* 8.5*  HCT 25.9*  --   --  23.2* 26.8*  PLT 383  --   --  393 425*  HEPARINUNFRC <0.10* 0.21* 0.42  --  0.39  CREATININE 7.68*  --   --  8.56* 6.18*    Estimated Creatinine Clearance: 6.2 mL/min (A) (by C-G formula based on SCr of 6.18 mg/dL (H)).  Assessment 84 y.o. female with Afib continues on heparin Heparin level therapeutic this AM  Goal of Therapy:  Heparin level 0.3-0.7 units/ml Monitor platelets by anticoagulation protocol: Yes   Plan:  Continue heparin at 750 units/hr  Follow up AM labs  Thank you Anette Guarneri, PharmD [phone]

## 2020-07-30 NOTE — Plan of Care (Signed)

## 2020-07-31 DIAGNOSIS — U071 COVID-19: Secondary | ICD-10-CM | POA: Diagnosis not present

## 2020-07-31 DIAGNOSIS — J9601 Acute respiratory failure with hypoxia: Secondary | ICD-10-CM | POA: Diagnosis not present

## 2020-07-31 DIAGNOSIS — N179 Acute kidney failure, unspecified: Secondary | ICD-10-CM | POA: Diagnosis not present

## 2020-07-31 DIAGNOSIS — I4891 Unspecified atrial fibrillation: Secondary | ICD-10-CM | POA: Diagnosis not present

## 2020-07-31 LAB — MAGNESIUM: Magnesium: 1.9 mg/dL (ref 1.7–2.4)

## 2020-07-31 LAB — COMPREHENSIVE METABOLIC PANEL
ALT: 18 U/L (ref 0–44)
AST: 25 U/L (ref 15–41)
Albumin: 2.7 g/dL — ABNORMAL LOW (ref 3.5–5.0)
Alkaline Phosphatase: 66 U/L (ref 38–126)
Anion gap: 17 — ABNORMAL HIGH (ref 5–15)
BUN: 96 mg/dL — ABNORMAL HIGH (ref 8–23)
CO2: 22 mmol/L (ref 22–32)
Calcium: 8.9 mg/dL (ref 8.9–10.3)
Chloride: 101 mmol/L (ref 98–111)
Creatinine, Ser: 6.72 mg/dL — ABNORMAL HIGH (ref 0.44–1.00)
GFR, Estimated: 6 mL/min — ABNORMAL LOW (ref >60)
Glucose, Bld: 130 mg/dL — ABNORMAL HIGH (ref 70–99)
Potassium: 3.7 mmol/L (ref 3.5–5.1)
Sodium: 140 mmol/L (ref 135–145)
Total Bilirubin: 0.9 mg/dL (ref 0.3–1.2)
Total Protein: 7 g/dL (ref 6.5–8.1)

## 2020-07-31 LAB — FERRITIN: Ferritin: 2665 ng/mL — ABNORMAL HIGH (ref 11–307)

## 2020-07-31 LAB — CBC WITH DIFFERENTIAL/PLATELET
Abs Immature Granulocytes: 0.45 10*3/uL — ABNORMAL HIGH (ref 0.00–0.07)
Basophils Absolute: 0 10*3/uL (ref 0.0–0.1)
Basophils Relative: 0 %
Eosinophils Absolute: 0 10*3/uL (ref 0.0–0.5)
Eosinophils Relative: 0 %
HCT: 27.1 % — ABNORMAL LOW (ref 36.0–46.0)
Hemoglobin: 8.8 g/dL — ABNORMAL LOW (ref 12.0–15.0)
Immature Granulocytes: 3 %
Lymphocytes Relative: 3 %
Lymphs Abs: 0.4 10*3/uL — ABNORMAL LOW (ref 0.7–4.0)
MCH: 21.6 pg — ABNORMAL LOW (ref 26.0–34.0)
MCHC: 32.5 g/dL (ref 30.0–36.0)
MCV: 66.6 fL — ABNORMAL LOW (ref 80.0–100.0)
Monocytes Absolute: 0.6 10*3/uL (ref 0.1–1.0)
Monocytes Relative: 4 %
Neutro Abs: 12.9 10*3/uL — ABNORMAL HIGH (ref 1.7–7.7)
Neutrophils Relative %: 90 %
Platelets: 353 10*3/uL (ref 150–400)
RBC: 4.07 MIL/uL (ref 3.87–5.11)
RDW: 15.3 % (ref 11.5–15.5)
WBC: 14.3 10*3/uL — ABNORMAL HIGH (ref 4.0–10.5)
nRBC: 0.4 % — ABNORMAL HIGH (ref 0.0–0.2)

## 2020-07-31 LAB — GLUCOSE, CAPILLARY
Glucose-Capillary: 101 mg/dL — ABNORMAL HIGH (ref 70–99)
Glucose-Capillary: 148 mg/dL — ABNORMAL HIGH (ref 70–99)
Glucose-Capillary: 162 mg/dL — ABNORMAL HIGH (ref 70–99)
Glucose-Capillary: 169 mg/dL — ABNORMAL HIGH (ref 70–99)
Glucose-Capillary: 55 mg/dL — ABNORMAL LOW (ref 70–99)
Glucose-Capillary: 73 mg/dL (ref 70–99)
Glucose-Capillary: 91 mg/dL (ref 70–99)

## 2020-07-31 LAB — C-REACTIVE PROTEIN: CRP: 14.9 mg/dL — ABNORMAL HIGH (ref <1.0)

## 2020-07-31 LAB — HEPARIN LEVEL (UNFRACTIONATED): Heparin Unfractionated: 0.45 IU/mL (ref 0.30–0.70)

## 2020-07-31 LAB — PHOSPHORUS: Phosphorus: 6.4 mg/dL — ABNORMAL HIGH (ref 2.5–4.6)

## 2020-07-31 LAB — HEPATITIS B CORE ANTIBODY, IGM: Hep B C IgM: NONREACTIVE

## 2020-07-31 LAB — HEPATITIS B SURFACE ANTIGEN: Hepatitis B Surface Ag: NONREACTIVE

## 2020-07-31 LAB — BRAIN NATRIURETIC PEPTIDE: B Natriuretic Peptide: 113 pg/mL — ABNORMAL HIGH (ref 0.0–100.0)

## 2020-07-31 LAB — HEPATITIS B SURFACE ANTIBODY,QUALITATIVE: Hep B S Ab: NONREACTIVE

## 2020-07-31 LAB — D-DIMER, QUANTITATIVE: D-Dimer, Quant: 1.95 ug/mL-FEU — ABNORMAL HIGH (ref 0.00–0.50)

## 2020-07-31 LAB — LACTATE DEHYDROGENASE: LDH: 363 U/L — ABNORMAL HIGH (ref 98–192)

## 2020-07-31 MED ORDER — DARBEPOETIN ALFA 150 MCG/0.3ML IJ SOSY
PREFILLED_SYRINGE | INTRAMUSCULAR | Status: AC
  Administered 2020-07-31: 150 ug via INTRAVENOUS
  Filled 2020-07-31: qty 0.3

## 2020-07-31 MED ORDER — HEPARIN SODIUM (PORCINE) 1000 UNIT/ML IJ SOLN
INTRAMUSCULAR | Status: AC
  Administered 2020-07-31: 2600 [IU]
  Filled 2020-07-31: qty 3

## 2020-07-31 MED ORDER — IPRATROPIUM-ALBUTEROL 0.5-2.5 (3) MG/3ML IN SOLN: 3.0000 mL | Freq: Four times a day (QID) | RESPIRATORY_TRACT | Status: DC

## 2020-07-31 MED ORDER — ASCORBIC ACID 500 MG PO TABS
500.0000 mg | ORAL_TABLET | Freq: Every day | ORAL | Status: DC
  Administered 2020-07-31 – 2020-08-14 (×15): 500 mg via ORAL
  Filled 2020-07-31 (×15): qty 1

## 2020-07-31 MED ORDER — ZINC SULFATE 220 (50 ZN) MG PO CAPS
220.0000 mg | ORAL_CAPSULE | Freq: Every day | ORAL | Status: DC
  Administered 2020-07-31 – 2020-08-14 (×15): 220 mg via ORAL
  Filled 2020-07-31 (×15): qty 1

## 2020-07-31 MED ORDER — INSULIN ASPART 100 UNIT/ML ~~LOC~~ SOLN
0.0000 [IU] | Freq: Three times a day (TID) | SUBCUTANEOUS | Status: DC
  Administered 2020-08-01: 2 [IU] via SUBCUTANEOUS
  Administered 2020-08-01 – 2020-08-02 (×2): 1 [IU] via SUBCUTANEOUS
  Administered 2020-08-03: 5 [IU] via SUBCUTANEOUS
  Administered 2020-08-03: 3 [IU] via SUBCUTANEOUS
  Administered 2020-08-04: 2 [IU] via SUBCUTANEOUS
  Administered 2020-08-04: 1 [IU] via SUBCUTANEOUS
  Administered 2020-08-05: 2 [IU] via SUBCUTANEOUS
  Administered 2020-08-06: 3 [IU] via SUBCUTANEOUS
  Administered 2020-08-08: 2 [IU] via SUBCUTANEOUS
  Administered 2020-08-08: 3 [IU] via SUBCUTANEOUS
  Administered 2020-08-09: 1 [IU] via SUBCUTANEOUS
  Administered 2020-08-09 – 2020-08-11 (×3): 2 [IU] via SUBCUTANEOUS
  Administered 2020-08-12: 3 [IU] via SUBCUTANEOUS
  Administered 2020-08-12 – 2020-08-13 (×3): 2 [IU] via SUBCUTANEOUS
  Administered 2020-08-14: 3 [IU] via SUBCUTANEOUS

## 2020-07-31 MED ORDER — DARBEPOETIN ALFA 150 MCG/0.3ML IJ SOSY
150.0000 ug | PREFILLED_SYRINGE | INTRAMUSCULAR | Status: DC
  Filled 2020-07-31 (×2): qty 0.3

## 2020-07-31 MED ORDER — INSULIN ASPART 100 UNIT/ML ~~LOC~~ SOLN
3.0000 [IU] | Freq: Three times a day (TID) | SUBCUTANEOUS | Status: DC
  Administered 2020-07-31 – 2020-08-03 (×8): 3 [IU] via SUBCUTANEOUS

## 2020-07-31 MED ORDER — INSULIN ASPART 100 UNIT/ML ~~LOC~~ SOLN
0.0000 [IU] | Freq: Every day | SUBCUTANEOUS | Status: DC
  Administered 2020-08-09 – 2020-08-13 (×3): 2 [IU] via SUBCUTANEOUS

## 2020-07-31 NOTE — Progress Notes (Signed)
PROGRESS NOTE    [name] [name]  NOB:[id number] DOB: 1936-01-04 DOA: 07/25/2020 PCP: [name], MD     Brief Narrative:  [name] [name] is an 84 year old female with history of RA, CKD 4, DM 2, HTN, HLD, OSA on CPAP, TIA, chronic diastolic CHF.   To the hospital with 5 days of cough, lethargy, confusion. Was found to be hypoxic, tested positive for Covid and was admitted to the hospital. She was also found to have acute kidney injury. She was also found to have A. fib with RVR. Cardiology and nephrology were consulted on admission. Renal function has not improved and the patient is transferred to [facility] in anticipation of dialysis initiation.   Subjective: 12/24 patient still uremic but currently being dialyzed for her second session today.  Hopefully in a.m. cognition will have improved.   Assessment & Plan: Covid vaccination; unvaccinated   Principal Problem:   Acute hypoxemic respiratory failure due to COVID-19 [facility]) Active Problems:   Essential hypertension   HLD (hyperlipidemia)   DM (diabetes mellitus), type 2 with renal complications (HCC)   Chronic diastolic HF (heart failure) ([place])   CAP (community acquired pneumonia)   CKD (chronic kidney disease), stage IV ([place])   AKI (acute kidney injury) ([place])   Sepsis ([place])   Atrial fibrillation with RVR ([place])  Acute respiratory failure with hypoxia/Covid pneumonia/suspected bacterial superinfection COVID-19 Labs  Recent Labs    07/29/20 0147 07/30/20 0500 07/31/20 0050  DDIMER 1.31* 2.02* 1.95*  FERRITIN 2,260* 2,574* 2,665*  LDH 271* 339* 363*  CRP 6.9* 5.7* 14.9*    Lab Results  Component Value Date   SARSCOV2NAA POSITIVE (A) 07/25/2020  -Patient with CrCl<55ml/min therefore and eligible for Remdesivir or Baricitinib -In addition possible bacterial superinfection initial procalcitonin=Results for ZYASIA, HALBLEIB (MRN [id number]) as of 07/28/2020 19:45  Ref. Range 07/25/2020 08:40   Procalcitonin Latest Units: ng/mL 2.35  -Trend procalcitonin -Cultures NGTD -12/22 spoke with pharmacy all antibiotics have been completed.   -Solu-Medrol 40 mg BID -Vitamins per Covid protocol -DuoNeb QID -Flutter valve -Incentive spirometry -  Sepsis pneumonia (Covid 19/suspected CAP) -On admission criteria for sepsis tachycardia, tachypnea, leukocytosis -Cultures obtained, placed on antibiotics.  All physiologic signs of sepsis have resolved.  Elevated D-dimer -Unsure if elevation secondary to worsening disease process for secondary to patient's fluid contraction.  Patient just had HD last night.  Currently no increase in O2 demand. -If D-dimer continues to increase will obtain CTA chest after consulting with nephrologist (patient already on HD if they do not believe patient's kidneys recoverable will proceed).  Otherwise may just treat empirically because currently patient would not be able to cooperate with VQ scan  Acute on CKD stage IV (baseline Cr 2.4-2.7) -On presentation Cr 6.8 not improving -Transferred to [facility] per nephrology. -Does not currently fluid appear overloaded.  Anion gap metabolic acidosis -Worsening anion gap metabolic acidosis -35/46 1 amp sodium bicarb -12/22 nontunneled HD cath placed  Chronic diastolic CHF -BNP on admission= 259 -Strict in and out +4.4 L -Daily weight Filed Weights   07/30/20 0124 07/31/20 0619 07/31/20 1315  Weight: 68.5 kg 70.1 kg 68.3 kg  -12/22 Metoprolol 12.5 mg BID Hold for MAP<60  A. fib with RVR -CHADS2Vasc score is 7 (HTN, age x 2, DM, TIA x2, gender), Can convert to [place] later in admission -Cardiology consulted -Continue Amiodarone drip -12/22 currently sinus arrhythmia currently NSR -Continue heparin drip -Last seen by cardiology on 12/18 per their note would consider outpatient monitor after  systemic illness have resolved. -Cardiology recommends echocardiogram once rate better controlled. -12/22 Echocardiogram  pending  Essential HTN -See CHF  Acute metabolic encephalopathy -36/46 resolved answer questions appropriately follows all commands  Hx TIA -Hold aspirin in the setting of heparin infusion -Lipitor 20 mg daily -12/22 LDL = 57: Goal LDL <70   DM type II uncontrolled with Hyperglycemia -12/22 Hemoglobin A1c= 7.7  -12/22 Levemir 7 units BID -12/22 NovoLog 5 units qac -12/22 moderate SSI  -Tradjenta 5 mg daily  Mixed Anemia -Anemia panel most consistent with a mixed anemia -Occult blood pending      DVT prophylaxis: Heparin drip Code Status: Full Family Communication: 12/24 spoke with [facility] discussed plan of care answered all questions Status is: Inpatient    Dispo: The patient is from:               Anticipated d/c is to: Home              Anticipated d/c date is: 12/25              Patient currently unstable       Consultants:  Nephrology Cardiology  Procedures/Significant Events:  12/22 nontunneled HD catheter terminating within the superior aspect RIGHT atrium   I have personally reviewed and interpreted all radiology studies and my findings are as above.  VENTILATOR SETTINGS: HFNC 12/23 Flow; 7 L/min SPO2; 90%   Cultures 12/18 blood RIGHT AC negative 12/18 MRSA by PCR negative 12/18 blood right hand negative final x2    Antimicrobials: Anti-infectives (From admission, onward)   Start     Ordered Stop   07/26/20 1000  cefTRIAXone (ROCEPHIN) 1 g in sodium chloride 0.9 % 100 mL IVPB        07/25/20 1138 07/30/20 0959   07/26/20 1000  doxycycline (VIBRAMYCIN) 100 mg in sodium chloride 0.9 % 250 mL IVPB        07/25/20 1138 07/27/20 2335   07/25/20 0500  levofloxacin (LEVAQUIN) IVPB 750 mg        07/25/20 0453 07/25/20 0824         Devices    LINES / TUBES:      Continuous Infusions: . amiodarone 30 mg/hr (07/31/20 0408)  . diltiazem (CARDIZEM) infusion Stopped (07/27/20 0233)  . heparin       Objective: Vitals:    07/31/20 1430 07/31/20 1500 07/31/20 1530 07/31/20 1600  BP: (!) 149/72 (!) 152/72 (!) 160/77 (!) 168/76  Pulse:      Resp: 18 (!) 26 19 19   Temp:      TempSrc:      SpO2:      Weight:      Height:        Intake/Output Summary (Last 24 hours) at 07/31/2020 1604 Last data filed at 07/31/2020 1300 Gross per 24 hour  Intake 392.42 ml  Output 650 ml  Net -257.58 ml   Filed Weights   07/30/20 0124 07/31/20 0619 07/31/20 1315  Weight: 68.5 kg 70.1 kg 68.3 kg   Physical Exam:  General: Somnolent, difficult to arouse responds only to painful stimuli, positive acute respiratory distress Eyes: negative scleral hemorrhage, negative anisocoria, negative icterus ENT: Negative Runny nose, negative gingival bleeding, Neck:  Negative scars, masses, torticollis, lymphadenopathy, JVD Lungs: Clear to auscultation bilaterally without wheezes or crackles, right chest wall nontender HD cath in place negative sign of bleeding or infection Cardiovascular: Regular rate and rhythm without murmur gallop or rub normal S1 and S2 Abdomen: negative abdominal  pain, nondistended, positive soft, bowel sounds, no rebound, no ascites, no appreciable mass Extremities: No significant cyanosis, clubbing, or edema bilateral lower extremities Skin: Negative rashes, lesions, ulcers Psychiatric: Unable to evaluate secondary to patient's somnolence Central nervous system: Withdraws to painful stimuli .     Data Reviewed: Care during the described time interval was provided by me .  I have reviewed this patient's available data, including medical history, events of note, physical examination, and all test results as part of my evaluation.  CBC: Recent Labs  Lab 07/27/20 0605 07/28/20 0210 07/29/20 0147 07/30/20 0500 07/31/20 0050  WBC 13.0* 9.6 11.9* 23.1* 14.3*  NEUTROABS 12.0* 9.0* 11.2* 20.4* 12.9*  HGB 8.6* 8.2* 7.7* 8.5* 8.8*  HCT 27.5* 25.9* 23.2* 26.8* 27.1*  MCV 70.0* 68.7* 67.1* 67.3* 66.6*  PLT  402* 383 393 425* 956   Basic Metabolic Panel: Recent Labs  Lab 07/25/20 1113 07/26/20 0741 07/27/20 0605 07/28/20 0210 07/29/20 0147 07/30/20 0500 07/31/20 0050  NA  --    < > 136 135 137 138 140  K  --    < > 3.8 4.1 4.0 3.5 3.7  CL  --    < > 102 104 104 99 101  CO2  --    < > 16* 15* 15* 19* 22  GLUCOSE  --    < > 222* 205* 233* 99 130*  BUN  --    < > 99* 116* 137* 85* 96*  CREATININE  --    < > 6.95* 7.68* 8.56* 6.18* 6.72*  CALCIUM  --    < > 8.7* 8.7* 8.8* 8.8* 8.9  MG 2.0  --   --   --  2.1 1.7 1.9  PHOS  --   --   --   --  6.3* 4.6 6.4*   < > = values in this interval not displayed.   GFR: Estimated Creatinine Clearance: 5.7 mL/min (A) (by C-G formula based on SCr of 6.72 mg/dL (H)). Liver Function Tests: Recent Labs  Lab 07/27/20 0605 07/28/20 0210 07/29/20 0147 07/30/20 0500 07/31/20 0050  AST 37 24 21 32 25  ALT 19 16 17 19 18   ALKPHOS 60 55 56 61 66  BILITOT 0.5 0.5 0.8 0.8 0.9  PROT 7.9 6.7 6.6 7.3 7.0  ALBUMIN 2.8* 2.3* 2.5* 2.8* 2.7*   No results for input(s): LIPASE, AMYLASE in the last 168 hours. No results for input(s): AMMONIA in the last 168 hours. Coagulation Profile: Recent Labs  Lab 07/25/20 1113  INR 1.2   Cardiac Enzymes: No results for input(s): CKTOTAL, CKMB, CKMBINDEX, TROPONINI in the last 168 hours. BNP (last 3 results) No results for input(s): PROBNP in the last 8760 hours. HbA1C: Recent Labs    07/29/20 0147  HGBA1C 8.0*   CBG: Recent Labs  Lab 07/31/20 0000 07/31/20 0018 07/31/20 0346 07/31/20 0756 07/31/20 1227  GLUCAP 55* 73 162* 148* 169*   Lipid Profile: Recent Labs    07/29/20 0147  CHOL 127  HDL 54  LDLCALC 57  TRIG 81  CHOLHDL 2.4   Thyroid Function Tests: No results for input(s): TSH, T4TOTAL, FREET4, T3FREE, THYROIDAB in the last 72 hours. Anemia Panel: Recent Labs    07/30/20 0500 07/31/20 0050  VITAMINB12 1,205*  --   FOLATE 14.2  --   FERRITIN 2,574* 2,665*  TIBC 143*  --   IRON  124  --   RETICCTPCT 1.5  --    Sepsis Labs: Recent Labs  Lab  07/25/20 0300 07/25/20 0840 07/28/20 2043 07/29/20 0147 07/30/20 0500  PROCALCITON  --  2.35 0.96 0.84 0.52  LATICACIDVEN 1.3  --   --   --   --     Recent Results (from the past 240 hour(s))  Resp Panel by RT-PCR (Flu A&B, Covid) Nasopharyngeal Swab     Status: Abnormal   Collection Time: 07/25/20  1:56 AM   Specimen: Nasopharyngeal Swab; Nasopharyngeal(NP) swabs in vial transport medium  Result Value Ref Range Status   SARS Coronavirus 2 by RT PCR POSITIVE (A) NEGATIVE Final    Comment: CRITICAL RESULT CALLED TO, READ BACK BY AND VERIFIED WITH: Burnis Medin RN 07/25/20 @0603  BY P.HENDERSON (NOTE) SARS-CoV-2 target nucleic acids are DETECTED.  The SARS-CoV-2 RNA is generally detectable in upper respiratory specimens during the acute phase of infection. Positive results are indicative of the presence of the identified virus, but do not rule out bacterial infection or co-infection with other pathogens not detected by the test. Clinical correlation with patient history and other diagnostic information is necessary to determine patient infection status. The expected result is Negative.  Fact Sheet for Patients: EntrepreneurPulse.com.au  Fact Sheet for Healthcare Providers: IncredibleEmployment.be  This test is not yet approved or cleared by the [place] FDA and  has been authorized for detection and/or diagnosis of SARS-CoV-2 by FDA under an Emergency Use Authorization (EUA).  This EUA will remain in effect (me aning this test can be used) for the duration of  the COVID-19 declaration under Section 564(b)(1) of the Act, 21 U.S.C. section 360bbb-3(b)(1), unless the authorization is terminated or revoked sooner.     Influenza A by PCR NEGATIVE NEGATIVE Final   Influenza B by PCR NEGATIVE NEGATIVE Final    Comment: (NOTE) The Xpert Xpress SARS-CoV-2/FLU/RSV plus assay  is intended as an aid in the diagnosis of influenza from Nasopharyngeal swab specimens and should not be used as a sole basis for treatment. Nasal washings and aspirates are unacceptable for Xpert Xpress SARS-CoV-2/FLU/RSV testing.  Fact Sheet for Patients: EntrepreneurPulse.com.au  Fact Sheet for Healthcare Providers: IncredibleEmployment.be  This test is not yet approved or cleared by the [place] FDA and has been authorized for detection and/or diagnosis of SARS-CoV-2 by FDA under an Emergency Use Authorization (EUA). This EUA will remain in effect (meaning this test can be used) for the duration of the COVID-19 declaration under Section 564(b)(1) of the Act, 21 U.S.C. section 360bbb-3(b)(1), unless the authorization is terminated or revoked.  Performed at [facility], [place] [address], [place], [place] [address]   Culture, blood (single)     Status: None   Collection Time: 07/25/20  6:19 AM   Specimen: BLOOD  Result Value Ref Range Status   Specimen Description   Final    BLOOD RIGHT ANTECUBITAL Performed at [place] [address] [place], [place] [address]    Special Requests   Final    BOTTLES DRAWN AEROBIC AND ANAEROBIC Blood Culture adequate volume Performed at [place] [address], [place], [place] [address]    Culture   Final    NO GROWTH 5 DAYS Performed at [facility], [place] [address] [place], [place] [address]    Report Status 07/30/2020 FINAL  Final  MRSA PCR Screening     Status: None   Collection Time: 07/25/20 11:58 AM   Specimen: Nasopharyngeal  Result Value Ref Range Status   MRSA by PCR NEGATIVE NEGATIVE Final  Comment:        The GeneXpert MRSA Assay (FDA approved for NASAL specimens only), is one component of a comprehensive MRSA colonization surveillance program. It is not intended to diagnose MRSA infection nor to guide  or monitor treatment for MRSA infections. Performed at [facility], [place] [address], [place], [place] [address]   Blood Culture (routine x 2)     Status: None   Collection Time: 07/25/20  4:18 PM   Specimen: BLOOD  Result Value Ref Range Status   Specimen Description   Final    BLOOD RIGHT HAND Performed at [place] [address] [place], [place] [address]    Special Requests   Final    BOTTLES DRAWN AEROBIC ONLY Blood Culture results may not be optimal due to an inadequate volume of blood received in culture bottles Performed at [place] [address] [place] [address], [place], [place] [address]    Culture   Final    NO GROWTH 5 DAYS Performed at [facility], [place] [address], [place], [place] [address]    Report Status 07/30/2020 FINAL  Final  Blood Culture (routine x 2)     Status: None   Collection Time: 07/25/20  4:18 PM   Specimen: BLOOD  Result Value Ref Range Status   Specimen Description   Final    BLOOD RIGHT HAND Performed at [place] [address], [place], [place] [address]    Special Requests   Final    BOTTLES DRAWN AEROBIC ONLY Blood Culture adequate volume Performed at [place] [facility] [address], [place], [place] [address]    Culture   Final    NO GROWTH 5 DAYS Performed at [facility], [place] [address], [place], [place] [address]    Report Status 07/30/2020 FINAL  Final         Radiology Studies: ECHOCARDIOGRAM LIMITED  Result Date: 07/30/2020    ECHOCARDIOGRAM LIMITED REPORT   Patient Name:   Danyia CRAWFORD Droge Date of Exam: 07/30/2020 Medical Rec #:  [id number]              Height:       61.0 in Accession #:    [id number]             Weight:       151.0 lb Date of Birth:  [birth year]              BSA:          1.676 m Patient Age:    60 years               BP:           160/98 mmHg Patient Gender: F                      HR:           98  bpm. Exam Location:  Inpatient Procedure: Limited Echo, Cardiac Doppler and Color Doppler Indications:    Atrial fibrillation  History:        Patient has prior history of Echocardiogram examinations, most                 recent 05/26/2015. Arrythmias:Atrial Fibrillation,                 Signs/Symptoms:Altered Mental Status and Shortness of Breath;  Risk Factors:Diabetes, Hypertension and Dyslipidemia. COVID+,                 sepsis.  Sonographer:    Dustin Flock Referring Phys: [id number] [place]  1. Left ventricular ejection fraction, by estimation, is 55 to 60%. The left ventricle has normal function. The left ventricle has no regional wall motion abnormalities. There is mild concentric left ventricular hypertrophy. Left ventricular diastolic parameters are consistent with Grade I diastolic dysfunction (impaired relaxation). Elevated left ventricular end-diastolic pressure.  2. Right ventricular systolic function is normal. The right ventricular size is normal. There is mildly elevated pulmonary artery systolic pressure.  3. The mitral valve is normal in structure. Trivial mitral valve regurgitation. No evidence of mitral stenosis.  4. The aortic valve is normal in structure. Aortic valve regurgitation is not visualized. No aortic stenosis is present.  5. The inferior vena cava is normal in size with greater than 50% respiratory variability, suggesting right atrial pressure of 3 mmHg. FINDINGS  Left Ventricle: Left ventricular ejection fraction, by estimation, is 55 to 60%. The left ventricle has normal function. The left ventricle has no regional wall motion abnormalities. The left ventricular internal cavity size was normal in size. There is  mild concentric left ventricular hypertrophy. Left ventricular diastolic parameters are consistent with Grade I diastolic dysfunction (impaired relaxation). Elevated left ventricular end-diastolic pressure. Right Ventricle: The right  ventricular size is normal. No increase in right ventricular wall thickness. Right ventricular systolic function is normal. There is mildly elevated pulmonary artery systolic pressure. The tricuspid regurgitant velocity is 3.23  m/s, and with an assumed right atrial pressure of 3 mmHg, the estimated right ventricular systolic pressure is 99.2 mmHg. Left Atrium: Left atrial size was normal in size. Right Atrium: Right atrial size was normal in size. Pericardium: There is no evidence of pericardial effusion. Mitral Valve: The mitral valve is normal in structure. Trivial mitral valve regurgitation. No evidence of mitral valve stenosis. Tricuspid Valve: The tricuspid valve is normal in structure. Tricuspid valve regurgitation is trivial. No evidence of tricuspid stenosis. Aortic Valve: The aortic valve is normal in structure. Aortic valve regurgitation is not visualized. No aortic stenosis is present. Aortic valve peak gradient measures 9.2 mmHg. Pulmonic Valve: The pulmonic valve was normal in structure. Pulmonic valve regurgitation is not visualized. No evidence of pulmonic stenosis. Aorta: The aortic root is normal in size and structure. Venous: The inferior vena cava is normal in size with greater than 50% respiratory variability, suggesting right atrial pressure of 3 mmHg. IAS/Shunts: No atrial level shunt detected by color flow Doppler. LEFT VENTRICLE PLAX 2D LVIDd:         3.50 cm  Diastology LVIDs:         2.50 cm  LV e' medial:    3.26 cm/s LV PW:         1.30 cm  LV E/e' medial:  23.2 LV IVS:        1.20 cm  LV e' lateral:   6.09 cm/s LVOT diam:     2.00 cm  LV E/e' lateral: 12.4 LVOT Area:     3.14 cm  RIGHT VENTRICLE RV S prime:     5.22 cm/s LEFT ATRIUM         Index LA diam:    3.00 cm 1.79 cm/m  AORTIC VALVE AV Vmax:      152.00 cm/s AV Peak Grad: 9.2 mmHg  AORTA Ao Root diam: 2.80 cm MITRAL VALVE  TRICUSPID VALVE MV Area (PHT): 7.44 cm     TR Peak grad:   41.7 mmHg MV Decel Time: 102  msec     TR Vmax:        323.00 cm/s MV E velocity: 75.50 cm/s MV A velocity: 114.00 cm/s  SHUNTS MV E/A ratio:  0.66         Systemic Diam: 2.00 cm Skeet Latch MD Electronically signed by Skeet Latch MD Signature Date/Time: 07/30/2020/4:30:42 PM    Final         Scheduled Meds: . amiodarone  150 mg Intravenous Once  . amLODipine  10 mg Oral Daily  . atorvastatin  20 mg Oral q1800  . chlorhexidine  15 mL Mouth Rinse BID  . Chlorhexidine Gluconate Cloth  6 each Topical Daily  . Chlorhexidine Gluconate Cloth  6 each Topical Q0600  . Chlorhexidine Gluconate Cloth  6 each Topical Q0600  . cloNIDine  0.1 mg Oral TID  . [START ON 08/07/2020] darbepoetin (ARANESP) injection - DIALYSIS  150 mcg Intravenous Q Fri-HD  . haloperidol lactate  2 mg Intravenous Once  . insulin aspart  0-5 Units Subcutaneous QHS  . insulin aspart  0-9 Units Subcutaneous TID WC  . insulin aspart  3 Units Subcutaneous TID WC  . insulin detemir  7 Units Subcutaneous BID  . Ipratropium-Albuterol  1 puff Inhalation BID  . linagliptin  5 mg Oral Daily  . mouth rinse  15 mL Mouth Rinse BID  . mouth rinse  15 mL Mouth Rinse q12n4p  . methylPREDNISolone (SOLU-MEDROL) injection  40 mg Intravenous Q12H  . metoprolol tartrate  12.5 mg Oral BID  . sodium bicarbonate  650 mg Oral TID  . sodium chloride flush  10-40 mL Intracatheter Q12H  . sodium chloride flush  3 mL Intravenous Q12H   Continuous Infusions: . amiodarone 30 mg/hr (07/31/20 0408)  . diltiazem (CARDIZEM) infusion Stopped (07/27/20 0233)  . heparin       LOS: 6 days    Time spent:40 min    Nabiha Planck, Geraldo Docker, MD [facility] Pager [phone]  If 7PM-7AM, please contact night-coverage [email or web address] Password [facility] 07/31/2020, 4:04 PM

## 2020-07-31 NOTE — Progress Notes (Signed)
Physical Therapy Treatment Patient Details Name: [name] [name] MRN: [id number] DOB: [birth year] Today's Date: 07/31/2020    History of Present Illness Pt is an 84 y.o. female admitted 07/25/20 with 5 days of cough, lethargy, confusion; found to be hypoxic and tested (+) COVID-19, also with AKI, afib with RVR, sepsis. Likely plan for temporary HD cath placed and HD initiation 12/22. PMH includes RA, CKD 4, DM2, HTN, OSA on CPAP, TIA, CHF.   PT Comments    Pt able to progress mobility with less assist needed this session, but presents with increased somnolence and worsening cognition. Pt able to walk 62' with RW, requiring max verbal cues for sequencing and direction, as well as mod-maxA to manage RW. Pt endorses "fog" due significant fatigue. VSS on 4L O2 [place]. At end of session, pt reports date is 1927 and perseverating on this despite attempts to reorient. Per RN, was able to state earlier that tomorrow is Christmas. Pt would most likely benefit from return home with HHPT services and max family support in a familiar environment; if adequate assist not available, pt will require SNF.    Follow Up Recommendations  SNF;Supervision/Assistance - 24 hour     Equipment Recommendations  3in1 (PT) (TBD-may have RW)    Recommendations for Other Services       Precautions / Restrictions Precautions Precautions: Fall Restrictions Weight Bearing Restrictions: No    Mobility  Bed Mobility Overal bed mobility: Needs Assistance Bed Mobility: Supine to Sit     Supine to sit: Supervision     General bed mobility comments: Received [place] sitting in bed from elevated HOB; increased time and effort to sit EOB, requiring frequent cues to task  Transfers Overall transfer level: Needs assistance Equipment used: Rolling walker (2 wheeled) Transfers: Sit to/from Stand Sit to Stand: Min guard         General transfer comment: Multiple sit<>stands from EOB and chair to RW, min guard for  balance; improved stability from prior session  Ambulation/Gait Ambulation/Gait assistance: Min guard;Min assist;Mod assist Gait Distance (Feet): 70 Feet Assistive device: Rolling walker (2 wheeled) Gait Pattern/deviations: Step-through pattern;Decreased stride length;Trunk flexed Gait velocity: Decreased Gait velocity interpretation: <1.31 ft/sec, indicative of household ambulator General Gait Details: Slow, fatigued gait with RW, pt not requiring physical assist to maintain balance, but requiring max verbal cues for direction and task (i.e. that's your room... walk into it... yes, turn right); at times requiring mod-maxA to maneuver RW as pt running into objects and not correcting despite verbal cues; very fatigued by end of ambulation   Stairs             Wheelchair Mobility    Modified Rankin (Stroke Patients Only)       Balance Overall balance assessment: Needs assistance Sitting-balance support: Feet unsupported Sitting balance-Leahy Scale: Fair       Standing balance-Leahy Scale: Poor Standing balance comment: Reliant on UE support                            Cognition Arousal/Alertness: Awake/alert Behavior During Therapy: Flat affect Overall Cognitive Status: Impaired/Different from baseline Area of Impairment: Attention;Memory;Following commands;Awareness;Problem solving;Orientation;Safety/judgement                 Orientation Level: Disoriented to;Time Current Attention Level: Sustained Memory: Decreased short-term memory Following Commands: Follows one step commands with increased time;Follows one step commands inconsistently Safety/Judgement: Decreased awareness of safety;Decreased awareness of deficits Awareness: Intellectual Problem  Solving: Slow processing;Decreased initiation;Difficulty sequencing;Requires verbal cues;Requires tactile cues General Comments: Pt with increased somnolence today; becoming more alert and participatory  once mobilizing, but requiring repeated, max verbal cues for directions and safety. Pt attributes this to fatigue. Asked orientation at end of session; pt able to state her birthday, but then says it's currently 1927 despite attempts to reorient (per RN, was able to state Christmas is tomorrow earlier this morning)      Exercises      General Comments General comments (skin integrity, edema, etc.): Post-ambulation BP 149/65, pt denies dizziness, endorses fatigue      Pertinent Vitals/Pain Pain Assessment: No/denies pain    Home Living                      Prior Function            PT Goals (current goals can now be found in the care plan section) Progress towards PT goals: Progressing toward goals    Frequency    Min 3X/week      PT Plan Current plan remains appropriate    Co-evaluation              AM-PAC PT "6 Clicks" Mobility   Outcome Measure  Help needed turning from your back to your side while in a flat bed without using bedrails?: A Little Help needed moving from lying on your back to sitting on the side of a flat bed without using bedrails?: A Little Help needed moving to and from a bed to a chair (including a wheelchair)?: A Little Help needed standing up from a chair using your arms (e.g., wheelchair or bedside chair)?: A Little Help needed to walk in hospital room?: A Lot Help needed climbing 3-5 steps with a railing? : A Lot 6 Click Score: 16    End of Session Equipment Utilized During Treatment: Gait belt;Oxygen Activity Tolerance: Patient tolerated treatment well;Patient limited by fatigue Patient left: in chair;with call bell/phone within reach;with chair alarm set Nurse Communication: Mobility status PT Visit Diagnosis: Other abnormalities of gait and mobility (R26.89);Muscle weakness (generalized) (M62.81)     Time: 8546-2703 PT Time Calculation (min) (ACUTE ONLY): 26 min  Charges:  $Gait Training: 8-22 mins $Therapeutic  Activity: 8-22 mins                     [name] [name], PT, DPT Acute Rehabilitation Services  Pager [phone] Office [place] 07/31/2020, 11:50 AM

## 2020-07-31 NOTE — Progress Notes (Signed)
Inpatient Diabetes Program Recommendations  AACE/ADA: New Consensus Statement on Inpatient Glycemic Control (2015)  Target Ranges:  Prepandial:   less than 140 mg/dL      Peak postprandial:   less than 180 mg/dL (1-2 hours)      Critically ill patients:  140 - 180 mg/dL   Lab Results  Component Value Date   GLUCAP 148 (H) 07/31/2020   HGBA1C 8.0 (H) 07/29/2020    Review of Glycemic Control Results for [name] [name], [name] [name] (MRN [id number]) as of 07/31/2020 12:27  Ref. Range 07/30/2020 20:29 07/31/2020 00:00 07/31/2020 00:18 07/31/2020 03:46 07/31/2020 07:56  Glucose-Capillary Latest Ref Range: 70 - 99 mg/dL 129 (H) 55 (L) 73 162 (H) 148 (H)    Inpatient Diabetes Program Recommendations:   Consider: -Decrease Novolog meal coverage to 3 units tid if eats 50% -Change Novolog correction to 0-9 units tid ac meals + hs 0-5 since patient is now eating  Thank you, Bethena Roys E. Valjean Ruppel, RN, MSN, CDE  Diabetes Coordinator Inpatient Glycemic Control Team Team Pager [phone] (8am-5pm) 07/31/2020 12:29 PM

## 2020-07-31 NOTE — Progress Notes (Signed)
Pt BS 55. Will give pt juice and crackers and recheck. Pt is asymptomatic at this time

## 2020-07-31 NOTE — Progress Notes (Signed)
Patient ID: [name] [name], female   DOB: 1935/10/23, 84 y.o.   MRN: [id number]   S: BUN and crt rose from yesterday to today - no UOP recorded-  Per hosp note yesterday was very somnolent- due for second HD today    O:BP (!) 158/100 (BP Location: Right Arm)   Pulse 100   Temp 97.7 F (36.5 C) (Oral)   Resp 20   Ht '5\' 1"'  (1.549 m)   Wt 70.1 kg   SpO2 92%   BMI 29.20 kg/m   Intake/Output Summary (Last 24 hours) at 07/31/2020 1108 Last data filed at 07/31/2020 0029 Gross per 24 hour  Intake 392.42 ml  Output --  Net 392.42 ml   Intake/Output: I/O last 3 completed shifts: In: 816.1 [P.O.:118; I.V.:698.1] Out: 0   Intake/Output this shift:  No intake/output data recorded. Weight change: 1.6 kg Gen: NAD CVS: RRR no rub Resp: cta  Abd: +BS, soft, NT/ND Ext: no edema  Recent Labs  Lab 07/25/20 0840 07/26/20 0741 07/27/20 0605 07/28/20 0210 07/29/20 0147 07/30/20 0500 07/31/20 0050  NA 135 132* 136 135 137 138 140  K 3.5 3.9 3.8 4.1 4.0 3.5 3.7  CL 101 99 102 104 104 99 101  CO2 18* 13* 16* 15* 15* 19* 22  GLUCOSE 124* 312* 222* 205* 233* 99 130*  BUN 93* 96* 99* 116* 137* 85* 96*  CREATININE 6.04* 6.63* 6.95* 7.68* 8.56* 6.18* 6.72*  ALBUMIN 2.7* 2.8* 2.8* 2.3* 2.5* 2.8* 2.7*  CALCIUM 7.9* 8.6* 8.7* 8.7* 8.8* 8.8* 8.9  PHOS  --   --   --   --  6.3* 4.6 6.4*  AST 35 40 37 24 21 32 25  ALT '15 19 19 16 17 19 18   ' Liver Function Tests: Recent Labs  Lab 07/29/20 0147 07/30/20 0500 07/31/20 0050  AST 21 32 25  ALT '17 19 18  ' ALKPHOS 56 61 66  BILITOT 0.8 0.8 0.9  PROT 6.6 7.3 7.0  ALBUMIN 2.5* 2.8* 2.7*   No results for input(s): LIPASE, AMYLASE in the last 168 hours. No results for input(s): AMMONIA in the last 168 hours. CBC: Recent Labs  Lab 07/27/20 0605 07/28/20 0210 07/29/20 0147 07/30/20 0500 07/31/20 0050  WBC 13.0* 9.6 11.9* 23.1* 14.3*  NEUTROABS 12.0* 9.0* 11.2* 20.4* 12.9*  HGB 8.6* 8.2* 7.7* 8.5* 8.8*  HCT 27.5* 25.9* 23.2*  26.8* 27.1*  MCV 70.0* 68.7* 67.1* 67.3* 66.6*  PLT 402* 383 393 425* 353   Cardiac Enzymes: No results for input(s): CKTOTAL, CKMB, CKMBINDEX, TROPONINI in the last 168 hours. CBG: Recent Labs  Lab 07/30/20 2029 07/31/20 0000 07/31/20 0018 07/31/20 0346 07/31/20 0756  GLUCAP 129* 55* 73 162* 148*    Iron Studies:  Recent Labs    07/30/20 0500 07/31/20 0050  IRON 124  --   TIBC 143*  --   FERRITIN 2,574* 2,665*   Studies/Results: IR Fluoro Guide CV Line Right  Result Date: 07/29/2020 INDICATION: History of COVID now with acute renal insufficiency. Please perform placement of a non tunneled hemodialysis catheter for the initiation of dialysis. EXAM: NON-TUNNELED CENTRAL VENOUS HEMODIALYSIS CATHETER PLACEMENT WITH ULTRASOUND AND FLUOROSCOPIC GUIDANCE COMPARISON:  None. MEDICATIONS: None, the patient is currently admitted to the hospital receiving intravenous antibiotics; The antibiotic was administered within an appropriate time interval prior to skin puncture. FLUOROSCOPY TIME:  30 seconds COMPLICATIONS: None immediate. PROCEDURE: Informed written consent was obtained from the patient after a discussion of the risks, benefits, and alternatives  to treatment. Questions regarding the procedure were encouraged and answered. The right neck and chest were prepped with chlorhexidine in a sterile fashion, and a sterile drape was applied covering the operative field. Maximum barrier sterile technique with sterile gowns and gloves were used for the procedure. A timeout was performed prior to the initiation of the procedure. After the overlying soft tissues were anesthetized, a small venotomy incision was created and a micropuncture kit was utilized to access the internal jugular vein. Real-time ultrasound guidance was utilized for vascular access including the acquisition of a permanent ultrasound image documenting patency of the accessed vessel. The microwire was utilized to measure appropriate  catheter length. A stiff glidewire was advanced to the level of the IVC. Under fluoroscopic guidance, the venotomy was serially dilated, ultimately allowing placement of a 16 cm temporary [place] catheter with tip ultimately terminating within the superior aspect of the right atrium. Final catheter positioning was confirmed and documented with a spot radiographic image. The catheter aspirates and flushes normally. The catheter was flushed with appropriate volume heparin dwells. The catheter exit site was secured with a 0-Prolene retention suture. A dressing was applied. The patient tolerated the procedure well without immediate post procedural complication. IMPRESSION: Successful placement of a right internal jugular approach 16 cm temporary dialysis catheter with tip terminating with in the superior aspect of the right atrium. The catheter is ready for immediate use. PLAN: This catheter may be converted to a tunneled dialysis catheter at a later date as indicated. Electronically Signed   By: Sandi Mariscal M.D.   On: 07/29/2020 14:57   IR US Guide Vasc Access Right  Result Date: 07/29/2020 INDICATION: History of COVID now with acute renal insufficiency. Please perform placement of a non tunneled hemodialysis catheter for the initiation of dialysis. EXAM: NON-TUNNELED CENTRAL VENOUS HEMODIALYSIS CATHETER PLACEMENT WITH ULTRASOUND AND FLUOROSCOPIC GUIDANCE COMPARISON:  None. MEDICATIONS: None, the patient is currently admitted to the hospital receiving intravenous antibiotics; The antibiotic was administered within an appropriate time interval prior to skin puncture. FLUOROSCOPY TIME:  30 seconds COMPLICATIONS: None immediate. PROCEDURE: Informed written consent was obtained from the patient after a discussion of the risks, benefits, and alternatives to treatment. Questions regarding the procedure were encouraged and answered. The right neck and chest were prepped with chlorhexidine in a sterile fashion, and a  sterile drape was applied covering the operative field. Maximum barrier sterile technique with sterile gowns and gloves were used for the procedure. A timeout was performed prior to the initiation of the procedure. After the overlying soft tissues were anesthetized, a small venotomy incision was created and a micropuncture kit was utilized to access the internal jugular vein. Real-time ultrasound guidance was utilized for vascular access including the acquisition of a permanent ultrasound image documenting patency of the accessed vessel. The microwire was utilized to measure appropriate catheter length. A stiff glidewire was advanced to the level of the IVC. Under fluoroscopic guidance, the venotomy was serially dilated, ultimately allowing placement of a 16 cm temporary Mahurkar catheter with tip ultimately terminating within the superior aspect of the right atrium. Final catheter positioning was confirmed and documented with a spot radiographic image. The catheter aspirates and flushes normally. The catheter was flushed with appropriate volume heparin dwells. The catheter exit site was secured with a 0-Prolene retention suture. A dressing was applied. The patient tolerated the procedure well without immediate post procedural complication. IMPRESSION: Successful placement of a right internal jugular approach 16 cm temporary dialysis catheter with tip  terminating with in the superior aspect of the right atrium. The catheter is ready for immediate use. PLAN: This catheter may be converted to a tunneled dialysis catheter at a later date as indicated. Electronically Signed   By: Sandi Mariscal M.D.   On: 07/29/2020 14:57   ECHOCARDIOGRAM LIMITED  Result Date: 07/30/2020    ECHOCARDIOGRAM LIMITED REPORT   Patient Name:   Lance CRAWFORD Lott Date of Exam: 07/30/2020 Medical Rec #:  [id number]              Height:       61.0 in Accession #:    [id number]             Weight:       151.0 lb Date of Birth:  12-17-1935               BSA:          1.676 m Patient Age:    78 years               BP:           160/98 mmHg Patient Gender: F                      HR:           98 bpm. Exam Location:  Inpatient Procedure: Limited Echo, Cardiac Doppler and Color Doppler Indications:    Atrial fibrillation  History:        Patient has prior history of Echocardiogram examinations, most                 recent 05/26/2015. Arrythmias:Atrial Fibrillation,                 Signs/Symptoms:Altered Mental Status and Shortness of Breath;                 Risk Factors:Diabetes, Hypertension and Dyslipidemia. COVID+,                 sepsis.  Sonographer:    Dustin Flock Referring Phys: [id number] [place]  1. Left ventricular ejection fraction, by estimation, is 55 to 60%. The left ventricle has normal function. The left ventricle has no regional wall motion abnormalities. There is mild concentric left ventricular hypertrophy. Left ventricular diastolic parameters are consistent with Grade I diastolic dysfunction (impaired relaxation). Elevated left ventricular end-diastolic pressure.  2. Right ventricular systolic function is normal. The right ventricular size is normal. There is mildly elevated pulmonary artery systolic pressure.  3. The mitral valve is normal in structure. Trivial mitral valve regurgitation. No evidence of mitral stenosis.  4. The aortic valve is normal in structure. Aortic valve regurgitation is not visualized. No aortic stenosis is present.  5. The inferior vena cava is normal in size with greater than 50% respiratory variability, suggesting right atrial pressure of 3 mmHg. FINDINGS  Left Ventricle: Left ventricular ejection fraction, by estimation, is 55 to 60%. The left ventricle has normal function. The left ventricle has no regional wall motion abnormalities. The left ventricular internal cavity size was normal in size. There is  mild concentric left ventricular hypertrophy. Left ventricular diastolic parameters  are consistent with Grade I diastolic dysfunction (impaired relaxation). Elevated left ventricular end-diastolic pressure. Right Ventricle: The right ventricular size is normal. No increase in right ventricular wall thickness. Right ventricular systolic function is normal. There is mildly elevated pulmonary artery systolic pressure. The tricuspid regurgitant velocity is 3.23  m/s,  and with an assumed right atrial pressure of 3 mmHg, the estimated right ventricular systolic pressure is 23.3 mmHg. Left Atrium: Left atrial size was normal in size. Right Atrium: Right atrial size was normal in size. Pericardium: There is no evidence of pericardial effusion. Mitral Valve: The mitral valve is normal in structure. Trivial mitral valve regurgitation. No evidence of mitral valve stenosis. Tricuspid Valve: The tricuspid valve is normal in structure. Tricuspid valve regurgitation is trivial. No evidence of tricuspid stenosis. Aortic Valve: The aortic valve is normal in structure. Aortic valve regurgitation is not visualized. No aortic stenosis is present. Aortic valve peak gradient measures 9.2 mmHg. Pulmonic Valve: The pulmonic valve was normal in structure. Pulmonic valve regurgitation is not visualized. No evidence of pulmonic stenosis. Aorta: The aortic root is normal in size and structure. Venous: The inferior vena cava is normal in size with greater than 50% respiratory variability, suggesting right atrial pressure of 3 mmHg. IAS/Shunts: No atrial level shunt detected by color flow Doppler. LEFT VENTRICLE PLAX 2D LVIDd:         3.50 cm  Diastology LVIDs:         2.50 cm  LV e' medial:    3.26 cm/s LV PW:         1.30 cm  LV E/e' medial:  23.2 LV IVS:        1.20 cm  LV e' lateral:   6.09 cm/s LVOT diam:     2.00 cm  LV E/e' lateral: 12.4 LVOT Area:     3.14 cm  RIGHT VENTRICLE RV S prime:     5.22 cm/s LEFT ATRIUM         Index LA diam:    3.00 cm 1.79 cm/m  AORTIC VALVE AV Vmax:      152.00 cm/s AV Peak Grad: 9.2 mmHg   AORTA Ao Root diam: 2.80 cm MITRAL VALVE                TRICUSPID VALVE MV Area (PHT): 7.44 cm     TR Peak grad:   41.7 mmHg MV Decel Time: 102 msec     TR Vmax:        323.00 cm/s MV E velocity: 75.50 cm/s MV A velocity: 114.00 cm/s  SHUNTS MV E/A ratio:  0.66         Systemic Diam: 2.00 cm Skeet Latch MD Electronically signed by Skeet Latch MD Signature Date/Time: 07/30/2020/4:30:42 PM    Final    . amiodarone  150 mg Intravenous Once  . amLODipine  10 mg Oral Daily  . atorvastatin  20 mg Oral q1800  . chlorhexidine  15 mL Mouth Rinse BID  . Chlorhexidine Gluconate Cloth  6 each Topical Daily  . Chlorhexidine Gluconate Cloth  6 each Topical Q0600  . Chlorhexidine Gluconate Cloth  6 each Topical Q0600  . cloNIDine  0.1 mg Oral TID  . haloperidol lactate  2 mg Intravenous Once  . insulin aspart  0-15 Units Subcutaneous Q4H  . insulin aspart  5 Units Subcutaneous TID WC  . insulin detemir  7 Units Subcutaneous BID  . Ipratropium-Albuterol  1 puff Inhalation BID  . linagliptin  5 mg Oral Daily  . mouth rinse  15 mL Mouth Rinse BID  . mouth rinse  15 mL Mouth Rinse q12n4p  . methylPREDNISolone (SOLU-MEDROL) injection  40 mg Intravenous Q12H  . metoprolol tartrate  12.5 mg Oral BID  . sodium bicarbonate  650 mg Oral TID  .  sodium chloride flush  10-40 mL Intracatheter Q12H  . sodium chloride flush  3 mL Intravenous Q12H    BMET    Component Value Date/Time   NA 140 07/31/2020 0050   NA 140 05/05/2020 1237   K 3.7 07/31/2020 0050   CL 101 07/31/2020 0050   CO2 22 07/31/2020 0050   GLUCOSE 130 (H) 07/31/2020 0050   BUN 96 (H) 07/31/2020 0050   BUN 43 (H) 05/05/2020 1237   CREATININE 6.72 (H) 07/31/2020 0050   CALCIUM 8.9 07/31/2020 0050   CALCIUM 8.8 01/15/2018 1005   GFRNONAA 6 (L) 07/31/2020 0050   GFRAA 18 (L) 05/05/2020 1237   CBC    Component Value Date/Time   WBC 14.3 (H) 07/31/2020 0050   RBC 4.07 07/31/2020 0050   HGB 8.8 (L) 07/31/2020 0050   HGB 9.1 (L)  01/30/2020 1558   HCT 27.1 (L) 07/31/2020 0050   HCT 30.6 (L) 01/30/2020 1558   PLT 353 07/31/2020 0050   PLT 251 01/30/2020 1558   MCV 66.6 (L) 07/31/2020 0050   MCV 72 (L) 01/30/2020 1558   MCH 21.6 (L) 07/31/2020 0050   MCHC 32.5 07/31/2020 0050   RDW 15.3 07/31/2020 0050   RDW 15.7 (H) 01/30/2020 1558   LYMPHSABS 0.4 (L) 07/31/2020 0050   MONOABS 0.6 07/31/2020 0050   EOSABS 0.0 07/31/2020 0050   BASOSABS 0.0 07/31/2020 0050   Assessment/Plan:  1. AKI on CKD stage IV-  In setting of sepsis due to covid-19 and CAP.  Baseline Scr 2.7-3.  Renal US without obstruction.  No improvement with IVF's and rising BUN/Cr.  her BUN was markedly elevated and her Scr continues to climb.  She was amenable to HD if needed.  IR placed temporary HD catheter 12/22 followed by first HD overnight- numbers improved as to be expected-  Unfortunately BUN and crt rose from yesterday to today so will plan for HD again today.  Her baseline CKD was so advanced that I suspect this illness will leave her with ESRD-  Plan third HD for Sunday 2. Acute hypoxemic respiratory failure due to covid-19 and community acquired PNA- on IV steroids, but not on remdesivir due to AKI and no baricitinib due to bacterial infection. Still on high flow O2 3. Sepsis due to covid-19 and CAP- improving  4. Atrial fib with RVR- started on amiodarone per Cardiology 5. Acute metabolic encephalopathy- likely multifactorial with AKI, covid-19, hypoxia, and sepsis.   6. Anemia of acute illness and CKD stage IV- follow H/H and iron stores. hgb 8.8 today- will add ESA but hold on iron 7. AGMA- due to #1.  Start bicarb also HD-  stable 8. HTN- stable 9. DM type 2  - per primary. 10. Check PTH at some point-  Phos is not terrible without binder at this time    [place] [phone]

## 2020-07-31 NOTE — Progress Notes (Signed)
[facility] for Heparin Indication: atrial fibrillation  Allergies  Allergen Reactions  . Penicillins Swelling and Rash    Patient Measurements: Height: 5\' 1"  (154.9 cm) Weight: 70.1 kg (154 lb 8.7 oz) IBW/kg (Calculated) : 47.8 Heparin Dosing Weight: 60 kg  Vital Signs: Temp: 97.7 F (36.5 C) (12/24 0754) Temp Source: Oral (12/24 0754) BP: 158/100 (12/24 0754) Pulse Rate: 100 (12/24 0754)  Labs: Recent Labs    07/29/20 0030 07/29/20 0147 07/29/20 0147 07/30/20 0500 07/31/20 0050 07/31/20 0858  HGB  --  7.7*   < > 8.5* 8.8*  --   HCT  --  23.2*  --  26.8* 27.1*  --   PLT  --  393  --  425* 353  --   HEPARINUNFRC 0.42  --   --  0.39  --  0.45  CREATININE  --  8.56*  --  6.18* 6.72*  --    < > = values in this interval not displayed.    Estimated Creatinine Clearance: 5.8 mL/min (A) (by C-G formula based on SCr of 6.72 mg/dL (H)).  Assessment 84 y.o. female with Afib continued on heparin. Patient is therapeutic this morning with HL 0.45. Hgb is 8.8 stable and Plt are wnl. No signs of bleeding at this time per nursing. Pstient has difficulty taking medications by mouth at this time.   Goal of Therapy:  Heparin level 0.3-0.7 units/ml Monitor platelets by anticoagulation protocol: Yes   Plan:  Continue heparin at 750 units/hr  Daily HL and CBC  F/u plan for [place], PharmD, [place] Resident [phone] 07/31/2020 10:06 AM

## 2020-08-01 ENCOUNTER — Inpatient Hospital Stay (HOSPITAL_COMMUNITY): Payer: Medicare HMO

## 2020-08-01 DIAGNOSIS — J9601 Acute respiratory failure with hypoxia: Secondary | ICD-10-CM | POA: Diagnosis not present

## 2020-08-01 DIAGNOSIS — N179 Acute kidney failure, unspecified: Secondary | ICD-10-CM | POA: Diagnosis not present

## 2020-08-01 DIAGNOSIS — I4891 Unspecified atrial fibrillation: Secondary | ICD-10-CM | POA: Diagnosis not present

## 2020-08-01 DIAGNOSIS — U071 COVID-19: Secondary | ICD-10-CM | POA: Diagnosis not present

## 2020-08-01 LAB — CBC WITH DIFFERENTIAL/PLATELET
Abs Immature Granulocytes: 0.46 10*3/uL — ABNORMAL HIGH (ref 0.00–0.07)
Basophils Absolute: 0 10*3/uL (ref 0.0–0.1)
Basophils Relative: 0 %
Eosinophils Absolute: 0 10*3/uL (ref 0.0–0.5)
Eosinophils Relative: 0 %
HCT: 21.3 % — ABNORMAL LOW (ref 36.0–46.0)
Hemoglobin: 7 g/dL — ABNORMAL LOW (ref 12.0–15.0)
Immature Granulocytes: 3 %
Lymphocytes Relative: 1 %
Lymphs Abs: 0.2 10*3/uL — ABNORMAL LOW (ref 0.7–4.0)
MCH: 22.3 pg — ABNORMAL LOW (ref 26.0–34.0)
MCHC: 32.9 g/dL (ref 30.0–36.0)
MCV: 67.8 fL — ABNORMAL LOW (ref 80.0–100.0)
Monocytes Absolute: 0.4 10*3/uL (ref 0.1–1.0)
Monocytes Relative: 2 %
Neutro Abs: 15.6 10*3/uL — ABNORMAL HIGH (ref 1.7–7.7)
Neutrophils Relative %: 94 %
Platelets: 293 10*3/uL (ref 150–400)
RBC: 3.14 MIL/uL — ABNORMAL LOW (ref 3.87–5.11)
RDW: 15.7 % — ABNORMAL HIGH (ref 11.5–15.5)
WBC: 16.8 10*3/uL — ABNORMAL HIGH (ref 4.0–10.5)
nRBC: 0.5 % — ABNORMAL HIGH (ref 0.0–0.2)

## 2020-08-01 LAB — FERRITIN: Ferritin: 2066 ng/mL — ABNORMAL HIGH (ref 11–307)

## 2020-08-01 LAB — COMPREHENSIVE METABOLIC PANEL
ALT: 14 U/L (ref 0–44)
AST: 21 U/L (ref 15–41)
Albumin: 2.1 g/dL — ABNORMAL LOW (ref 3.5–5.0)
Alkaline Phosphatase: 49 U/L (ref 38–126)
Anion gap: 12 (ref 5–15)
BUN: 42 mg/dL — ABNORMAL HIGH (ref 8–23)
CO2: 26 mmol/L (ref 22–32)
Calcium: 8 mg/dL — ABNORMAL LOW (ref 8.9–10.3)
Chloride: 100 mmol/L (ref 98–111)
Creatinine, Ser: 3.98 mg/dL — ABNORMAL HIGH (ref 0.44–1.00)
GFR, Estimated: 11 mL/min — ABNORMAL LOW (ref >60)
Glucose, Bld: 147 mg/dL — ABNORMAL HIGH (ref 70–99)
Potassium: 4.3 mmol/L (ref 3.5–5.1)
Sodium: 138 mmol/L (ref 135–145)
Total Bilirubin: 0.6 mg/dL (ref 0.3–1.2)
Total Protein: 5.3 g/dL — ABNORMAL LOW (ref 6.5–8.1)

## 2020-08-01 LAB — MAGNESIUM: Magnesium: 1.6 mg/dL — ABNORMAL LOW (ref 1.7–2.4)

## 2020-08-01 LAB — GLUCOSE, CAPILLARY
Glucose-Capillary: 119 mg/dL — ABNORMAL HIGH (ref 70–99)
Glucose-Capillary: 133 mg/dL — ABNORMAL HIGH (ref 70–99)
Glucose-Capillary: 144 mg/dL — ABNORMAL HIGH (ref 70–99)
Glucose-Capillary: 161 mg/dL — ABNORMAL HIGH (ref 70–99)
Glucose-Capillary: 167 mg/dL — ABNORMAL HIGH (ref 70–99)
Glucose-Capillary: 60 mg/dL — ABNORMAL LOW (ref 70–99)
Glucose-Capillary: 61 mg/dL — ABNORMAL LOW (ref 70–99)
Glucose-Capillary: 76 mg/dL (ref 70–99)
Glucose-Capillary: 84 mg/dL (ref 70–99)

## 2020-08-01 LAB — PHOSPHORUS: Phosphorus: 3.9 mg/dL (ref 2.5–4.6)

## 2020-08-01 LAB — HEPARIN LEVEL (UNFRACTIONATED): Heparin Unfractionated: 0.3 IU/mL (ref 0.30–0.70)

## 2020-08-01 LAB — LACTATE DEHYDROGENASE: LDH: 309 U/L — ABNORMAL HIGH (ref 98–192)

## 2020-08-01 LAB — D-DIMER, QUANTITATIVE: D-Dimer, Quant: 1.58 ug/mL-FEU — ABNORMAL HIGH (ref 0.00–0.50)

## 2020-08-01 LAB — C-REACTIVE PROTEIN: CRP: 8.1 mg/dL — ABNORMAL HIGH (ref <1.0)

## 2020-08-01 MED ORDER — MORPHINE SULFATE (PF) 2 MG/ML IV SOLN
2.0000 mg | Freq: Once | INTRAVENOUS | Status: AC
  Administered 2020-08-01: 2 mg via INTRAVENOUS
  Filled 2020-08-01: qty 1

## 2020-08-01 MED ORDER — CHLORHEXIDINE GLUCONATE CLOTH 2 % EX PADS
6.0000 | MEDICATED_PAD | Freq: Every day | CUTANEOUS | Status: DC
  Administered 2020-08-03 – 2020-08-04 (×2): 6 via TOPICAL

## 2020-08-01 NOTE — Progress Notes (Signed)
Patient ID: [name] [name], female   DOB: Jul 17, 1936, 84 y.o.   MRN: [id number]   S: Had HD yest- removed 500-  Tolerated well- still seems pretty ill- hgb down to 7- UOP not recorded   O:BP 128/74 (BP Location: Left Arm)   Pulse 85   Temp 98.3 F (36.8 C) (Oral)   Resp 16   Ht 5\' 1"  (1.549 m)   Wt 68.8 kg   SpO2 93%   BMI 28.66 kg/m   Intake/Output Summary (Last 24 hours) at 08/01/2020 1547 Last data filed at 08/01/2020 1300 Gross per 24 hour  Intake 370 ml  Output 0 ml  Net 370 ml   Intake/Output: I/O last 3 completed shifts: In: 118 [P.O.:118] Out: 650 [Urine:650]  Intake/Output this shift:  Total I/O In: 370 [P.O.:370] Out: -  Weight change: -1.8 kg PE not performed  Recent Labs  Lab 07/26/20 0741 07/27/20 0605 07/28/20 0210 07/29/20 0147 07/30/20 0500 07/31/20 0050 08/01/20 0237  NA 132* 136 135 137 138 140 138  K 3.9 3.8 4.1 4.0 3.5 3.7 4.3  CL 99 102 104 104 99 101 100  CO2 13* 16* 15* 15* 19* 22 26  GLUCOSE 312* 222* 205* 233* 99 130* 147*  BUN 96* 99* 116* 137* 85* 96* 42*  CREATININE 6.63* 6.95* 7.68* 8.56* 6.18* 6.72* 3.98*  ALBUMIN 2.8* 2.8* 2.3* 2.5* 2.8* 2.7* 2.1*  CALCIUM 8.6* 8.7* 8.7* 8.8* 8.8* 8.9 8.0*  PHOS  --   --   --  6.3* 4.6 6.4* 3.9  AST 40 37 24 21 32 25 21  ALT 19 19 16 17 19 18 14    Liver Function Tests: Recent Labs  Lab 07/30/20 0500 07/31/20 0050 08/01/20 0237  AST 32 25 21  ALT 19 18 14   ALKPHOS 61 66 49  BILITOT 0.8 0.9 0.6  PROT 7.3 7.0 5.3*  ALBUMIN 2.8* 2.7* 2.1*   No results for input(s): LIPASE, AMYLASE in the last 168 hours. No results for input(s): AMMONIA in the last 168 hours. CBC: Recent Labs  Lab 07/28/20 0210 07/29/20 0147 07/30/20 0500 07/31/20 0050 08/01/20 0237  WBC 9.6 11.9* 23.1* 14.3* 16.8*  NEUTROABS 9.0* 11.2* 20.4* 12.9* 15.6*  HGB 8.2* 7.7* 8.5* 8.8* 7.0*  HCT 25.9* 23.2* 26.8* 27.1* 21.3*  MCV 68.7* 67.1* 67.3* 66.6* 67.8*  PLT 383 393 425* 353 293   Cardiac Enzymes: No  results for input(s): CKTOTAL, CKMB, CKMBINDEX, TROPONINI in the last 168 hours. CBG: Recent Labs  Lab 08/01/20 0009 08/01/20 0047 08/01/20 0400 08/01/20 0754 08/01/20 1201  GLUCAP 61* 84 133* 167* 144*    Iron Studies:  Recent Labs    07/30/20 0500 07/31/20 0050 08/01/20 0237  IRON 124  --   --   TIBC 143*  --   --   FERRITIN 2,574*   < > 2,066*   < > = values in this interval not displayed.   Studies/Results: DG CHEST PORT 1 VIEW  Result Date: 08/01/2020 CLINICAL DATA:  Pneumonia EXAM: PORTABLE CHEST 1 VIEW COMPARISON:  07/25/2020 FINDINGS: Stable low volume chest with patchy bilateral airspace disease. Cardiomegaly accentuated by rotation. Dialysis catheter with tip at the upper right atrium. No visible effusion or air leak. IMPRESSION: Stable low volume chest with bilateral pneumonia. Electronically Signed   By: Monte Fantasia M.D.   On: 08/01/2020 07:49   . amiodarone  150 mg Intravenous Once  . amLODipine  10 mg Oral Daily  . vitamin C  500 mg  Oral Daily  . atorvastatin  20 mg Oral q1800  . chlorhexidine  15 mL Mouth Rinse BID  . Chlorhexidine Gluconate Cloth  6 each Topical Daily  . Chlorhexidine Gluconate Cloth  6 each Topical Q0600  . Chlorhexidine Gluconate Cloth  6 each Topical Q0600  . cloNIDine  0.1 mg Oral TID  . [START ON 08/07/2020] darbepoetin (ARANESP) injection - DIALYSIS  150 mcg Intravenous Q Fri-HD  . haloperidol lactate  2 mg Intravenous Once  . insulin aspart  0-5 Units Subcutaneous QHS  . insulin aspart  0-9 Units Subcutaneous TID WC  . insulin aspart  3 Units Subcutaneous TID WC  . insulin detemir  7 Units Subcutaneous BID  . linagliptin  5 mg Oral Daily  . mouth rinse  15 mL Mouth Rinse BID  . mouth rinse  15 mL Mouth Rinse q12n4p  . methylPREDNISolone (SOLU-MEDROL) injection  40 mg Intravenous Q12H  . metoprolol tartrate  12.5 mg Oral BID  . sodium bicarbonate  650 mg Oral TID  . sodium chloride flush  10-40 mL Intracatheter Q12H  .  sodium chloride flush  3 mL Intravenous Q12H  . zinc sulfate  220 mg Oral Daily    BMET    Component Value Date/Time   NA 138 08/01/2020 0237   NA 140 05/05/2020 1237   K 4.3 08/01/2020 0237   CL 100 08/01/2020 0237   CO2 26 08/01/2020 0237   GLUCOSE 147 (H) 08/01/2020 0237   BUN 42 (H) 08/01/2020 0237   BUN 43 (H) 05/05/2020 1237   CREATININE 3.98 (H) 08/01/2020 0237   CALCIUM 8.0 (L) 08/01/2020 0237   CALCIUM 8.8 01/15/2018 1005   GFRNONAA 11 (L) 08/01/2020 0237   GFRAA 18 (L) 05/05/2020 1237   CBC    Component Value Date/Time   WBC 16.8 (H) 08/01/2020 0237   RBC 3.14 (L) 08/01/2020 0237   HGB 7.0 (L) 08/01/2020 0237   HGB 9.1 (L) 01/30/2020 1558   HCT 21.3 (L) 08/01/2020 0237   HCT 30.6 (L) 01/30/2020 1558   PLT 293 08/01/2020 0237   PLT 251 01/30/2020 1558   MCV 67.8 (L) 08/01/2020 0237   MCV 72 (L) 01/30/2020 1558   MCH 22.3 (L) 08/01/2020 0237   MCHC 32.9 08/01/2020 0237   RDW 15.7 (H) 08/01/2020 0237   RDW 15.7 (H) 01/30/2020 1558   LYMPHSABS 0.2 (L) 08/01/2020 0237   MONOABS 0.4 08/01/2020 0237   EOSABS 0.0 08/01/2020 0237   BASOSABS 0.0 08/01/2020 0237   Assessment/Plan:  1. AKI on CKD stage IV-  In setting of sepsis due to covid-19 and CAP.  Baseline Scr 2.7-3.  Renal US without obstruction.  No improvement with IVF's and rising BUN/Cr.  her BUN was markedly elevated and her Scr continues to climb.  She was amenable to HD if needed.  IR placed temporary HD catheter 12/22 followed by first HD overnight- second HD 12/24.  Her baseline CKD was so advanced that I suspect this illness will leave her with ESRD-  Plan third HD for Sunday- tomorrow- on Monday will need to start working toward OP HD 2. Acute hypoxemic respiratory failure due to covid-19 and community acquired PNA- on IV steroids, but not on remdesivir due to AKI and no baricitinib due to bacterial infection. Still on high flow O2 3. Sepsis due to covid-19 and CAP- improving  4. Atrial fib with RVR-  started on amiodarone per Cardiology 5. Acute metabolic encephalopathy- likely multifactorial with AKI, covid-19, hypoxia,  and sepsis.   6. Anemia of acute illness and CKD stage IV- follow H/H and iron stores. - will add ESA but hold on iron- hgb dropping- may need blood tomorrow 7. AGMA- due to #1.  Start bicarb also HD-  stable 8. HTN- stable 9. DM type 2  - per primary. 10. Check PTH at some point-  Phos is not terrible without binder at this time    [place] [phone]

## 2020-08-01 NOTE — Progress Notes (Signed)
   08/01/20 2134  Oxygen Therapy  O2 Device Nasal Cannula  O2 Flow Rate (L/min) 5 L/min  Patient Activity (if Appropriate) In bed  Pulse Oximetry Type Continuous  Oximetry Probe Site Changed No    Patient removed IV and telemetry.  Patient attempting to pull out her foley.  Bilateral mitts applied to hands.  Patient O2 saturation dropped to78-81% on 2L, patient unable to recover within 10 minutes.  Patient increased to 5L on nasal cannula, sating between 90-91%

## 2020-08-01 NOTE — Progress Notes (Signed)
[place] for Heparin Indication: atrial fibrillation  Allergies  Allergen Reactions  . Penicillins Swelling and Rash    Patient Measurements: Height: 5\' 1"  (154.9 cm) Weight: 68.8 kg (151 lb 10.8 oz) IBW/kg (Calculated) : 47.8 Heparin Dosing Weight: 60 kg  Vital Signs: Temp: 98.1 F (36.7 C) (12/25 0400) Temp Source: Oral (12/25 0400) BP: 127/85 (12/25 0400) Pulse Rate: 88 (12/25 0709)  Labs: Recent Labs    07/30/20 0500 07/31/20 0050 07/31/20 0858 08/01/20 0237  HGB 8.5* 8.8*  --  7.0*  HCT 26.8* 27.1*  --  21.3*  PLT 425* 353  --  293  HEPARINUNFRC 0.39  --  0.45 0.30  CREATININE 6.18* 6.72*  --  3.98*    Estimated Creatinine Clearance: 9.7 mL/min (A) (by C-G formula based on SCr of 3.98 mg/dL (H)).  Assessment 84 y.o. female with Afib continued on heparin. Patient is therapeutic this morning with HL 0.3. Hgb is down to 7 and Hct to 21.3. Pltc remains wnl. D-dimer trending down, now 1.58. No signs of bleeding at this time per nursing. Patient had reportedly been having difficulty taking medications by mouth given AMS, but is improving. Could consider transition to PO AC soon. Given drop in Hgb today, will continue current heparin rate and recheck tomorrow.   Goal of Therapy:  Heparin level 0.3-0.7 units/ml Monitor platelets by anticoagulation protocol: Yes   Plan:  Continue heparin at 750 units/hr  Daily HL and CBC  F/u plan for LT [facility]   Claudina Lick, PharmD PGY1 [place] Resident 08/01/2020 7:46 AM  Please check [email or web address] for unit-specific pharmacy phone numbers.

## 2020-08-01 NOTE — Progress Notes (Incomplete)
Hypoglycemic Event  CBG: 60  Treatment: 4 oz juice/soda  Symptoms: None  Follow-up CBG: Time:*** CBG Result:***  Possible Reasons for Event: {Possible Reasons for VZCHY:[id number]}  Comments/MD notified: Alfredo Martinez

## 2020-08-01 NOTE — Progress Notes (Signed)
PROGRESS NOTE    Briggitte Boline Pulsifer  VWP:[id number] DOB: January 26, 1936 DOA: 07/25/2020 PCP: [name], MD     Brief Narrative:  Laytoya Ion Kosmicki is an 85 year old female with history of RA, CKD 4, DM 2, HTN, HLD, OSA on CPAP, TIA, chronic diastolic CHF.   To the hospital with 5 days of cough, lethargy, confusion. Was found to be hypoxic, tested positive for Covid and was admitted to the hospital. She was also found to have acute kidney injury. She was also found to have A. fib with RVR. Cardiology and nephrology were consulted on admission. Renal function has not improved and the patient is transferred to [facility] in anticipation of dialysis initiation.   Subjective: 12/25 afebrile overnight A/O x4, patient does not recall the last 2 days clearly   Assessment & Plan: Covid vaccination; unvaccinated   Principal Problem:   Acute hypoxemic respiratory failure due to COVID-19 [facility]) Active Problems:   Essential hypertension   HLD (hyperlipidemia)   DM (diabetes mellitus), type 2 with renal complications (HCC)   Chronic diastolic HF (heart failure) ([place])   CAP (community acquired pneumonia)   CKD (chronic kidney disease), stage IV ([place])   AKI (acute kidney injury) ([place])   Sepsis ([place])   Atrial fibrillation with RVR ([place])  Acute respiratory failure with hypoxia/Covid pneumonia/suspected bacterial superinfection COVID-19 Labs  Recent Labs    07/30/20 0500 07/31/20 0050 08/01/20 0237  DDIMER 2.02* 1.95* 1.58*  FERRITIN 2,574* 2,665* 2,066*  LDH 339* 363* 309*  CRP 5.7* 14.9* 8.1*    Lab Results  Component Value Date   SARSCOV2NAA POSITIVE (A) 07/25/2020  -Patient with CrCl<19ml/min therefore and eligible for Remdesivir or Baricitinib -In addition possible bacterial superinfection initial procalcitonin=Results for TYKIA, MELLONE (MRN [id number]) as of 07/28/2020 19:45  Ref. Range 07/25/2020 08:40  Procalcitonin Latest Units: ng/mL 2.35  -Trend  procalcitonin -Cultures NGTD -12/22 spoke with pharmacy all antibiotics have been completed.   -Solu-Medrol 40 mg BID -Vitamins per Covid protocol -DuoNeb QID -Flutter valve -Incentive spirometry -  Sepsis pneumonia (Covid 19/suspected CAP) -On admission criteria for sepsis tachycardia, tachypnea, leukocytosis -Cultures obtained, placed on antibiotics.  All physiologic signs of sepsis have resolved.  Elevated D-dimer -Unsure if elevation secondary to worsening disease process for secondary to patient's fluid contraction.  Patient just had HD last night.  Currently no increase in O2 demand. -If D-dimer continues to increase will obtain CTA chest after consulting with nephrologist (patient already on HD if they do not believe patient's kidneys recoverable will proceed).  Otherwise may just treat empirically because currently patient would not be able to cooperate with VQ scan  Acute on CKD stage IV (baseline Cr 2.4-2.7) -On presentation Cr 6.8 not improving -Transferred to [facility] per nephrology. -Does not currently fluid appear overloaded.  Anion gap metabolic acidosis -Worsening anion gap metabolic acidosis -67/54 1 amp sodium bicarb -12/22 nontunneled HD cath placed  Chronic diastolic CHF -BNP on admission= 259 -Strict in and out +4.9 L -Daily weight Filed Weights   07/31/20 1315 07/31/20 1622 08/01/20 0709  Weight: 68.3 kg 68 kg 68.8 kg  -12/22 Metoprolol 12.5 mg BID Hold for MAP<60  A. fib with RVR -CHADS2Vasc score is 7 (HTN, age x 2, DM, TIA x2, gender), Can convert to [place] later in admission -Cardiology consulted -Continue Amiodarone drip -12/22 currently sinus arrhythmia currently NSR -Continue heparin drip -Last seen by cardiology on 12/18 per their note would consider outpatient monitor after systemic illness have resolved. -Cardiology recommends echocardiogram  once rate better controlled. -12/22 Echocardiogram pending  Essential HTN -See CHF  Acute metabolic  encephalopathy -12/21 resolved answer questions appropriately follows all commands  Hx TIA -Hold aspirin in the setting of heparin infusion -Lipitor 20 mg daily -12/22 LDL = 57: Goal LDL <70   DM type II uncontrolled with Hyperglycemia -12/22 Hemoglobin A1c= 7.7  -12/22 Levemir 7 units BID -12/22 NovoLog 5 units qac -12/22 moderate SSI  -Tradjenta 5 mg daily  Mixed Anemia -Anemia panel most consistent with a mixed anemia -Occult blood pending      DVT prophylaxis: Heparin drip Code Status: Full Family Communication: 12/24 spoke with [facility] discussed plan of care answered all questions Status is: Inpatient    Dispo: The patient is from:               Anticipated d/c is to: Home              Anticipated d/c date is: 12/25              Patient currently unstable       Consultants:  Nephrology Cardiology  Procedures/Significant Events:  12/22 nontunneled HD catheter terminating within the superior aspect RIGHT atrium   I have personally reviewed and interpreted all radiology studies and my findings are as above.  VENTILATOR SETTINGS: HFNC 12/25 Flow; 2 L/min SPO2; 93%   Cultures 12/18 blood RIGHT AC negative 12/18 MRSA by PCR negative 12/18 blood right hand negative final x2    Antimicrobials: Anti-infectives (From admission, onward)   Start     Ordered Stop   07/26/20 1000  cefTRIAXone (ROCEPHIN) 1 g in sodium chloride 0.9 % 100 mL IVPB        07/25/20 1138 07/30/20 0959   07/26/20 1000  doxycycline (VIBRAMYCIN) 100 mg in sodium chloride 0.9 % 250 mL IVPB        07/25/20 1138 07/27/20 2335   07/25/20 0500  levofloxacin (LEVAQUIN) IVPB 750 mg        07/25/20 0453 07/25/20 0824         Devices    LINES / TUBES:      Continuous Infusions: . amiodarone 30 mg/hr (08/01/20 0920)  . diltiazem (CARDIZEM) infusion Stopped (07/27/20 0233)  . heparin       Objective: Vitals:   08/01/20 0000 08/01/20 0400 08/01/20 0709 08/01/20 0752   BP: 120/64 127/85  (!) 143/86  Pulse: 75 87 88 86  Resp: 19 16 13 17   Temp: 98.4 F (36.9 C) 98.1 F (36.7 C)  98.7 F (37.1 C)  TempSrc: Oral Oral  Oral  SpO2: 98% 95% 97% 94%  Weight:   68.8 kg   Height:        Intake/Output Summary (Last 24 hours) at 08/01/2020 1024 Last data filed at 08/01/2020 0900 Gross per 24 hour  Intake 250 ml  Output 650 ml  Net -400 ml   Filed Weights   07/31/20 1315 07/31/20 1622 08/01/20 0709  Weight: 68.3 kg 68 kg 68.8 kg   Physical Exam:  General: Somnolent, difficult to arouse responds only to painful stimuli, positive acute respiratory distress Eyes: negative scleral hemorrhage, negative anisocoria, negative icterus ENT: Negative Runny nose, negative gingival bleeding, Neck:  Negative scars, masses, torticollis, lymphadenopathy, JVD Lungs: Clear to auscultation bilaterally without wheezes or crackles, right chest wall nontender HD cath in place negative sign of bleeding or infection Cardiovascular: Regular rate and rhythm without murmur gallop or rub normal S1 and S2 Abdomen: negative abdominal pain,  nondistended, positive soft, bowel sounds, no rebound, no ascites, no appreciable mass Extremities: No significant cyanosis, clubbing, or edema bilateral lower extremities Skin: Negative rashes, lesions, ulcers Psychiatric: Unable to evaluate secondary to patient's somnolence Central nervous system: Withdraws to painful stimuli .     Data Reviewed: Care during the described time interval was provided by me .  I have reviewed this patient's available data, including medical history, events of note, physical examination, and all test results as part of my evaluation.  CBC: Recent Labs  Lab 07/28/20 0210 07/29/20 0147 07/30/20 0500 07/31/20 0050 08/01/20 0237  WBC 9.6 11.9* 23.1* 14.3* 16.8*  NEUTROABS 9.0* 11.2* 20.4* 12.9* 15.6*  HGB 8.2* 7.7* 8.5* 8.8* 7.0*  HCT 25.9* 23.2* 26.8* 27.1* 21.3*  MCV 68.7* 67.1* 67.3* 66.6* 67.8*  PLT  383 393 425* 353 353   Basic Metabolic Panel: Recent Labs  Lab 07/25/20 1113 07/26/20 0741 07/28/20 0210 07/29/20 0147 07/30/20 0500 07/31/20 0050 08/01/20 0237  NA  --    < > 135 137 138 140 138  K  --    < > 4.1 4.0 3.5 3.7 4.3  CL  --    < > 104 104 99 101 100  CO2  --    < > 15* 15* 19* 22 26  GLUCOSE  --    < > 205* 233* 99 130* 147*  BUN  --    < > 116* 137* 85* 96* 42*  CREATININE  --    < > 7.68* 8.56* 6.18* 6.72* 3.98*  CALCIUM  --    < > 8.7* 8.8* 8.8* 8.9 8.0*  MG 2.0  --   --  2.1 1.7 1.9 1.6*  PHOS  --   --   --  6.3* 4.6 6.4* 3.9   < > = values in this interval not displayed.   GFR: Estimated Creatinine Clearance: 9.7 mL/min (A) (by C-G formula based on SCr of 3.98 mg/dL (H)). Liver Function Tests: Recent Labs  Lab 07/28/20 0210 07/29/20 0147 07/30/20 0500 07/31/20 0050 08/01/20 0237  AST 24 21 32 25 21  ALT 16 17 19 18 14   ALKPHOS 55 56 61 66 49  BILITOT 0.5 0.8 0.8 0.9 0.6  PROT 6.7 6.6 7.3 7.0 5.3*  ALBUMIN 2.3* 2.5* 2.8* 2.7* 2.1*   No results for input(s): LIPASE, AMYLASE in the last 168 hours. No results for input(s): AMMONIA in the last 168 hours. Coagulation Profile: Recent Labs  Lab 07/25/20 1113  INR 1.2   Cardiac Enzymes: No results for input(s): CKTOTAL, CKMB, CKMBINDEX, TROPONINI in the last 168 hours. BNP (last 3 results) No results for input(s): PROBNP in the last 8760 hours. HbA1C: No results for input(s): HGBA1C in the last 72 hours. CBG: Recent Labs  Lab 07/31/20 2002 08/01/20 0009 08/01/20 0047 08/01/20 0400 08/01/20 0754  GLUCAP 91 61* 84 133* 167*   Lipid Profile: No results for input(s): CHOL, HDL, LDLCALC, TRIG, CHOLHDL, LDLDIRECT in the last 72 hours. Thyroid Function Tests: No results for input(s): TSH, T4TOTAL, FREET4, T3FREE, THYROIDAB in the last 72 hours. Anemia Panel: Recent Labs    07/30/20 0500 07/31/20 0050 08/01/20 0237  VITAMINB12 1,205*  --   --   FOLATE 14.2  --   --   FERRITIN 2,574*  2,665* 2,066*  TIBC 143*  --   --   IRON 124  --   --   RETICCTPCT 1.5  --   --    Sepsis Labs: Recent Labs  Lab 07/28/20 2043 07/29/20 0147 07/30/20 0500  PROCALCITON 0.96 0.84 0.52    Recent Results (from the past 240 hour(s))  Resp Panel by RT-PCR (Flu A&B, Covid) Nasopharyngeal Swab     Status: Abnormal   Collection Time: 07/25/20  1:56 AM   Specimen: Nasopharyngeal Swab; Nasopharyngeal(NP) swabs in vial transport medium  Result Value Ref Range Status   SARS Coronavirus 2 by RT PCR POSITIVE (A) NEGATIVE Final    Comment: CRITICAL RESULT CALLED TO, READ BACK BY AND VERIFIED WITH: Burnis Medin RN 07/25/20 @0603  BY P.HENDERSON (NOTE) SARS-CoV-2 target nucleic acids are DETECTED.  The SARS-CoV-2 RNA is generally detectable in upper respiratory specimens during the acute phase of infection. Positive results are indicative of the presence of the identified virus, but do not rule out bacterial infection or co-infection with other pathogens not detected by the test. Clinical correlation with patient history and other diagnostic information is necessary to determine patient infection status. The expected result is Negative.  Fact Sheet for Patients: EntrepreneurPulse.com.au  Fact Sheet for Healthcare Providers: IncredibleEmployment.be  This test is not yet approved or cleared by the [place] FDA and  has been authorized for detection and/or diagnosis of SARS-CoV-2 by FDA under an Emergency Use Authorization (EUA).  This EUA will remain in effect (me aning this test can be used) for the duration of  the COVID-19 declaration under Section 564(b)(1) of the Act, 21 U.S.C. section 360bbb-3(b)(1), unless the authorization is terminated or revoked sooner.     Influenza A by PCR NEGATIVE NEGATIVE Final   Influenza B by PCR NEGATIVE NEGATIVE Final    Comment: (NOTE) The Xpert Xpress SARS-CoV-2/FLU/RSV plus assay is intended as an aid in  the diagnosis of influenza from Nasopharyngeal swab specimens and should not be used as a sole basis for treatment. Nasal washings and aspirates are unacceptable for Xpert Xpress SARS-CoV-2/FLU/RSV testing.  Fact Sheet for Patients: EntrepreneurPulse.com.au  Fact Sheet for Healthcare Providers: IncredibleEmployment.be  This test is not yet approved or cleared by the [place] FDA and has been authorized for detection and/or diagnosis of SARS-CoV-2 by FDA under an Emergency Use Authorization (EUA). This EUA will remain in effect (meaning this test can be used) for the duration of the COVID-19 declaration under Section 564(b)(1) of the Act, 21 U.S.C. section 360bbb-3(b)(1), unless the authorization is terminated or revoked.  Performed at [facility], [place] [address] [place], [place] [address]   Culture, blood (single)     Status: None   Collection Time: 07/25/20  6:19 AM   Specimen: BLOOD  Result Value Ref Range Status   Specimen Description   Final    BLOOD RIGHT ANTECUBITAL Performed at [place] [name], [place], [place] [address]    Special Requests   Final    BOTTLES DRAWN AEROBIC AND ANAEROBIC Blood Culture adequate volume Performed at [place] [address] [place], [place] [address]    Culture   Final    NO GROWTH 5 DAYS Performed at [facility], [place] [address], [place], [place] [address]    Report Status 07/30/2020 FINAL  Final  MRSA PCR Screening     Status: None   Collection Time: 07/25/20 11:58 AM   Specimen: Nasopharyngeal  Result Value Ref Range Status   MRSA by PCR NEGATIVE NEGATIVE Final    Comment:        The GeneXpert MRSA Assay (FDA approved for NASAL specimens only), is one  component of a comprehensive MRSA colonization surveillance program. It is not intended to diagnose MRSA infection nor to guide or monitor treatment  for MRSA infections. Performed at [facility], [place] [address], [place], [place] [address]   Blood Culture (routine x 2)     Status: None   Collection Time: 07/25/20  4:18 PM   Specimen: BLOOD  Result Value Ref Range Status   Specimen Description   Final    BLOOD RIGHT HAND Performed at [place] [address] [place], [place] [address]    Special Requests   Final    BOTTLES DRAWN AEROBIC ONLY Blood Culture results may not be optimal due to an inadequate volume of blood received in culture bottles Performed at [place] [address], [place], [place] [address]    Culture   Final    NO GROWTH 5 DAYS Performed at [facility], [place] [address] [place], [place] [address]    Report Status 07/30/2020 FINAL  Final  Blood Culture (routine x 2)     Status: None   Collection Time: 07/25/20  4:18 PM   Specimen: BLOOD  Result Value Ref Range Status   Specimen Description   Final    BLOOD RIGHT HAND Performed at [place] [address], [place], [place] [address]    Special Requests   Final    BOTTLES DRAWN AEROBIC ONLY Blood Culture adequate volume Performed at [place] [address], [place], [place] [address]    Culture   Final    NO GROWTH 5 DAYS Performed at [facility], [place] [address], [place], [place] [address]    Report Status 07/30/2020 FINAL  Final         Radiology Studies: DG CHEST PORT 1 VIEW  Result Date: 08/01/2020 CLINICAL DATA:  Pneumonia EXAM: PORTABLE CHEST 1 VIEW COMPARISON:  07/25/2020 FINDINGS: Stable low volume chest with patchy bilateral airspace disease. Cardiomegaly accentuated by rotation. Dialysis catheter with tip at the upper right atrium. No visible effusion or air leak. IMPRESSION: Stable low volume chest with bilateral pneumonia. Electronically Signed   By: Monte Fantasia M.D.   On: 08/01/2020 07:49   ECHOCARDIOGRAM  LIMITED  Result Date: 07/30/2020    ECHOCARDIOGRAM LIMITED REPORT   Patient Name:   Oleda CRAWFORD Bubel Date of Exam: 07/30/2020 Medical Rec #:  [id number]              Height:       61.0 in Accession #:    [id number]             Weight:       151.0 lb Date of Birth:  04-19-1936              BSA:          1.676 m Patient Age:    22 years               BP:           160/98 mmHg Patient Gender: F                      HR:           98 bpm. Exam Location:  Inpatient Procedure: Limited Echo, Cardiac Doppler and Color Doppler Indications:    Atrial fibrillation  History:        Patient has prior history of Echocardiogram  examinations, most                 recent 05/26/2015. Arrythmias:Atrial Fibrillation,                 Signs/Symptoms:Altered Mental Status and Shortness of Breath;                 Risk Factors:Diabetes, Hypertension and Dyslipidemia. COVID+,                 sepsis.  Sonographer:    Dustin Flock Referring Phys: [id number] [place]  1. Left ventricular ejection fraction, by estimation, is 55 to 60%. The left ventricle has normal function. The left ventricle has no regional wall motion abnormalities. There is mild concentric left ventricular hypertrophy. Left ventricular diastolic parameters are consistent with Grade I diastolic dysfunction (impaired relaxation). Elevated left ventricular end-diastolic pressure.  2. Right ventricular systolic function is normal. The right ventricular size is normal. There is mildly elevated pulmonary artery systolic pressure.  3. The mitral valve is normal in structure. Trivial mitral valve regurgitation. No evidence of mitral stenosis.  4. The aortic valve is normal in structure. Aortic valve regurgitation is not visualized. No aortic stenosis is present.  5. The inferior vena cava is normal in size with greater than 50% respiratory variability, suggesting right atrial pressure of 3 mmHg. FINDINGS  Left Ventricle: Left ventricular ejection fraction,  by estimation, is 55 to 60%. The left ventricle has normal function. The left ventricle has no regional wall motion abnormalities. The left ventricular internal cavity size was normal in size. There is  mild concentric left ventricular hypertrophy. Left ventricular diastolic parameters are consistent with Grade I diastolic dysfunction (impaired relaxation). Elevated left ventricular end-diastolic pressure. Right Ventricle: The right ventricular size is normal. No increase in right ventricular wall thickness. Right ventricular systolic function is normal. There is mildly elevated pulmonary artery systolic pressure. The tricuspid regurgitant velocity is 3.23  m/s, and with an assumed right atrial pressure of 3 mmHg, the estimated right ventricular systolic pressure is 75.1 mmHg. Left Atrium: Left atrial size was normal in size. Right Atrium: Right atrial size was normal in size. Pericardium: There is no evidence of pericardial effusion. Mitral Valve: The mitral valve is normal in structure. Trivial mitral valve regurgitation. No evidence of mitral valve stenosis. Tricuspid Valve: The tricuspid valve is normal in structure. Tricuspid valve regurgitation is trivial. No evidence of tricuspid stenosis. Aortic Valve: The aortic valve is normal in structure. Aortic valve regurgitation is not visualized. No aortic stenosis is present. Aortic valve peak gradient measures 9.2 mmHg. Pulmonic Valve: The pulmonic valve was normal in structure. Pulmonic valve regurgitation is not visualized. No evidence of pulmonic stenosis. Aorta: The aortic root is normal in size and structure. Venous: The inferior vena cava is normal in size with greater than 50% respiratory variability, suggesting right atrial pressure of 3 mmHg. IAS/Shunts: No atrial level shunt detected by color flow Doppler. LEFT VENTRICLE PLAX 2D LVIDd:         3.50 cm  Diastology LVIDs:         2.50 cm  LV e' medial:    3.26 cm/s LV PW:         1.30 cm  LV E/e' medial:   23.2 LV IVS:        1.20 cm  LV e' lateral:   6.09 cm/s LVOT diam:     2.00 cm  LV E/e' lateral: 12.4 LVOT Area:  3.14 cm  RIGHT VENTRICLE RV S prime:     5.22 cm/s LEFT ATRIUM         Index LA diam:    3.00 cm 1.79 cm/m  AORTIC VALVE AV Vmax:      152.00 cm/s AV Peak Grad: 9.2 mmHg  AORTA Ao Root diam: 2.80 cm MITRAL VALVE                TRICUSPID VALVE MV Area (PHT): 7.44 cm     TR Peak grad:   41.7 mmHg MV Decel Time: 102 msec     TR Vmax:        323.00 cm/s MV E velocity: 75.50 cm/s MV A velocity: 114.00 cm/s  SHUNTS MV E/A ratio:  0.66         Systemic Diam: 2.00 cm Skeet Latch MD Electronically signed by Skeet Latch MD Signature Date/Time: 07/30/2020/4:30:42 PM    Final         Scheduled Meds: . amiodarone  150 mg Intravenous Once  . amLODipine  10 mg Oral Daily  . vitamin C  500 mg Oral Daily  . atorvastatin  20 mg Oral q1800  . chlorhexidine  15 mL Mouth Rinse BID  . Chlorhexidine Gluconate Cloth  6 each Topical Daily  . Chlorhexidine Gluconate Cloth  6 each Topical Q0600  . Chlorhexidine Gluconate Cloth  6 each Topical Q0600  . cloNIDine  0.1 mg Oral TID  . [START ON 08/07/2020] darbepoetin (ARANESP) injection - DIALYSIS  150 mcg Intravenous Q Fri-HD  . haloperidol lactate  2 mg Intravenous Once  . insulin aspart  0-5 Units Subcutaneous QHS  . insulin aspart  0-9 Units Subcutaneous TID WC  . insulin aspart  3 Units Subcutaneous TID WC  . insulin detemir  7 Units Subcutaneous BID  . linagliptin  5 mg Oral Daily  . mouth rinse  15 mL Mouth Rinse BID  . mouth rinse  15 mL Mouth Rinse q12n4p  . methylPREDNISolone (SOLU-MEDROL) injection  40 mg Intravenous Q12H  . metoprolol tartrate  12.5 mg Oral BID  . sodium bicarbonate  650 mg Oral TID  . sodium chloride flush  10-40 mL Intracatheter Q12H  . sodium chloride flush  3 mL Intravenous Q12H  . zinc sulfate  220 mg Oral Daily   Continuous Infusions: . amiodarone 30 mg/hr (08/01/20 0920)  . diltiazem (CARDIZEM)  infusion Stopped (07/27/20 0233)  . heparin       LOS: 7 days    Time spent:40 min    Vassie Kugel, Geraldo Docker, MD [facility] Pager [phone]  If 7PM-7AM, please contact night-coverage [email or web address] Password [facility] 08/01/2020, 10:24 AM

## 2020-08-02 DIAGNOSIS — U071 COVID-19: Secondary | ICD-10-CM | POA: Diagnosis not present

## 2020-08-02 DIAGNOSIS — I4891 Unspecified atrial fibrillation: Secondary | ICD-10-CM | POA: Diagnosis not present

## 2020-08-02 DIAGNOSIS — J9601 Acute respiratory failure with hypoxia: Secondary | ICD-10-CM | POA: Diagnosis not present

## 2020-08-02 DIAGNOSIS — N179 Acute kidney failure, unspecified: Secondary | ICD-10-CM | POA: Diagnosis not present

## 2020-08-02 LAB — CBC WITH DIFFERENTIAL/PLATELET
Abs Immature Granulocytes: 0.81 10*3/uL — ABNORMAL HIGH (ref 0.00–0.07)
Basophils Absolute: 0.1 10*3/uL (ref 0.0–0.1)
Basophils Relative: 0 %
Eosinophils Absolute: 0 10*3/uL (ref 0.0–0.5)
Eosinophils Relative: 0 %
HCT: 25.7 % — ABNORMAL LOW (ref 36.0–46.0)
Hemoglobin: 8.1 g/dL — ABNORMAL LOW (ref 12.0–15.0)
Immature Granulocytes: 3 %
Lymphocytes Relative: 2 %
Lymphs Abs: 0.5 10*3/uL — ABNORMAL LOW (ref 0.7–4.0)
MCH: 21.8 pg — ABNORMAL LOW (ref 26.0–34.0)
MCHC: 31.5 g/dL (ref 30.0–36.0)
MCV: 69.3 fL — ABNORMAL LOW (ref 80.0–100.0)
Monocytes Absolute: 0.5 10*3/uL (ref 0.1–1.0)
Monocytes Relative: 2 %
Neutro Abs: 23.9 10*3/uL — ABNORMAL HIGH (ref 1.7–7.7)
Neutrophils Relative %: 93 %
Platelets: 313 10*3/uL (ref 150–400)
RBC: 3.71 MIL/uL — ABNORMAL LOW (ref 3.87–5.11)
RDW: 15.7 % — ABNORMAL HIGH (ref 11.5–15.5)
WBC: 25.7 10*3/uL — ABNORMAL HIGH (ref 4.0–10.5)
nRBC: 0.4 % — ABNORMAL HIGH (ref 0.0–0.2)

## 2020-08-02 LAB — COMPREHENSIVE METABOLIC PANEL
ALT: 18 U/L (ref 0–44)
AST: 19 U/L (ref 15–41)
Albumin: 2.6 g/dL — ABNORMAL LOW (ref 3.5–5.0)
Alkaline Phosphatase: 72 U/L (ref 38–126)
Anion gap: 16 — ABNORMAL HIGH (ref 5–15)
BUN: 59 mg/dL — ABNORMAL HIGH (ref 8–23)
CO2: 25 mmol/L (ref 22–32)
Calcium: 9 mg/dL (ref 8.9–10.3)
Chloride: 97 mmol/L — ABNORMAL LOW (ref 98–111)
Creatinine, Ser: 5.21 mg/dL — ABNORMAL HIGH (ref 0.44–1.00)
GFR, Estimated: 8 mL/min — ABNORMAL LOW (ref >60)
Glucose, Bld: 123 mg/dL — ABNORMAL HIGH (ref 70–99)
Potassium: 4 mmol/L (ref 3.5–5.1)
Sodium: 138 mmol/L (ref 135–145)
Total Bilirubin: 0.8 mg/dL (ref 0.3–1.2)
Total Protein: 6.6 g/dL (ref 6.5–8.1)

## 2020-08-02 LAB — GLUCOSE, CAPILLARY
Glucose-Capillary: 108 mg/dL — ABNORMAL HIGH (ref 70–99)
Glucose-Capillary: 113 mg/dL — ABNORMAL HIGH (ref 70–99)
Glucose-Capillary: 118 mg/dL — ABNORMAL HIGH (ref 70–99)
Glucose-Capillary: 130 mg/dL — ABNORMAL HIGH (ref 70–99)
Glucose-Capillary: 149 mg/dL — ABNORMAL HIGH (ref 70–99)
Glucose-Capillary: 64 mg/dL — ABNORMAL LOW (ref 70–99)
Glucose-Capillary: 64 mg/dL — ABNORMAL LOW (ref 70–99)
Glucose-Capillary: 74 mg/dL (ref 70–99)

## 2020-08-02 LAB — D-DIMER, QUANTITATIVE: D-Dimer, Quant: 1.87 ug/mL-FEU — ABNORMAL HIGH (ref 0.00–0.50)

## 2020-08-02 LAB — MAGNESIUM: Magnesium: 1.9 mg/dL (ref 1.7–2.4)

## 2020-08-02 LAB — PHOSPHORUS: Phosphorus: 5.3 mg/dL — ABNORMAL HIGH (ref 2.5–4.6)

## 2020-08-02 LAB — C-REACTIVE PROTEIN: CRP: 6.9 mg/dL — ABNORMAL HIGH (ref <1.0)

## 2020-08-02 LAB — HEPARIN LEVEL (UNFRACTIONATED): Heparin Unfractionated: 0.31 IU/mL (ref 0.30–0.70)

## 2020-08-02 LAB — LACTATE DEHYDROGENASE: LDH: 348 U/L — ABNORMAL HIGH (ref 98–192)

## 2020-08-02 LAB — FERRITIN: Ferritin: 2349 ng/mL — ABNORMAL HIGH (ref 11–307)

## 2020-08-02 MED ORDER — HEPARIN SODIUM (PORCINE) 1000 UNIT/ML IJ SOLN
INTRAMUSCULAR | Status: AC
  Filled 2020-08-02: qty 4

## 2020-08-02 MED ORDER — HALOPERIDOL LACTATE 5 MG/ML IJ SOLN
2.0000 mg | Freq: Once | INTRAMUSCULAR | Status: AC
  Administered 2020-08-02: 2 mg via INTRAVENOUS
  Filled 2020-08-02: qty 1

## 2020-08-02 NOTE — Progress Notes (Signed)
Patient ID: [name] [name], female   DOB: 07/02/1936, 84 y.o.   MRN: [id number]   S:  still seems pretty ill from notes, confusion- UOP not recorded-  Due for HD today    O:BP (!) 150/60   Pulse 80   Temp 98.1 F (36.7 C) (Oral)   Resp 16   Ht 5\' 1"  (1.549 m)   Wt 68.5 kg   SpO2 95%   BMI 28.53 kg/m   Intake/Output Summary (Last 24 hours) at 08/02/2020 1325 Last data filed at 08/02/2020 0840 Gross per 24 hour  Intake 705.2 ml  Output 300 ml  Net 405.2 ml   Intake/Output: I/O last 3 completed shifts: In: 955.2 [P.O.:490; I.V.:465.2] Out: 300 [Urine:300]  Intake/Output this shift:  Total I/O In: 120 [P.O.:120] Out: -  Weight change: 0.5 kg PE not performed  Recent Labs  Lab 07/27/20 0605 07/28/20 0210 07/29/20 0147 07/30/20 0500 07/31/20 0050 08/01/20 0237 08/02/20 0354  NA 136 135 137 138 140 138 138  K 3.8 4.1 4.0 3.5 3.7 4.3 4.0  CL 102 104 104 99 101 100 97*  CO2 16* 15* 15* 19* 22 26 25   GLUCOSE 222* 205* 233* 99 130* 147* 123*  BUN 99* 116* 137* 85* 96* 42* 59*  CREATININE 6.95* 7.68* 8.56* 6.18* 6.72* 3.98* 5.21*  ALBUMIN 2.8* 2.3* 2.5* 2.8* 2.7* 2.1* 2.6*  CALCIUM 8.7* 8.7* 8.8* 8.8* 8.9 8.0* 9.0  PHOS  --   --  6.3* 4.6 6.4* 3.9 5.3*  AST 37 24 21 32 25 21 19   ALT 19 16 17 19 18 14 18    Liver Function Tests: Recent Labs  Lab 07/31/20 0050 08/01/20 0237 08/02/20 0354  AST 25 21 19   ALT 18 14 18   ALKPHOS 66 49 72  BILITOT 0.9 0.6 0.8  PROT 7.0 5.3* 6.6  ALBUMIN 2.7* 2.1* 2.6*   No results for input(s): LIPASE, AMYLASE in the last 168 hours. No results for input(s): AMMONIA in the last 168 hours. CBC: Recent Labs  Lab 07/29/20 0147 07/30/20 0500 07/31/20 0050 08/01/20 0237 08/02/20 0354  WBC 11.9* 23.1* 14.3* 16.8* 25.7*  NEUTROABS 11.2* 20.4* 12.9* 15.6* 23.9*  HGB 7.7* 8.5* 8.8* 7.0* 8.1*  HCT 23.2* 26.8* 27.1* 21.3* 25.7*  MCV 67.1* 67.3* 66.6* 67.8* 69.3*  PLT 393 425* 353 293 313   Cardiac Enzymes: No results for  input(s): CKTOTAL, CKMB, CKMBINDEX, TROPONINI in the last 168 hours. CBG: Recent Labs  Lab 08/01/20 1759 08/01/20 1949 08/01/20 2309 08/02/20 0528 08/02/20 0722  GLUCAP 76 161* 119* 118* 108*    Iron Studies:  Recent Labs    08/02/20 0354  FERRITIN 2,349*   Studies/Results: DG CHEST PORT 1 VIEW  Result Date: 08/01/2020 CLINICAL DATA:  Pneumonia EXAM: PORTABLE CHEST 1 VIEW COMPARISON:  07/25/2020 FINDINGS: Stable low volume chest with patchy bilateral airspace disease. Cardiomegaly accentuated by rotation. Dialysis catheter with tip at the upper right atrium. No visible effusion or air leak. IMPRESSION: Stable low volume chest with bilateral pneumonia. Electronically Signed   By: Monte Fantasia M.D.   On: 08/01/2020 07:49   . heparin sodium (porcine)      . amiodarone  150 mg Intravenous Once  . amLODipine  10 mg Oral Daily  . vitamin C  500 mg Oral Daily  . atorvastatin  20 mg Oral q1800  . chlorhexidine  15 mL Mouth Rinse BID  . Chlorhexidine Gluconate Cloth  6 each Topical Q0600  . cloNIDine  0.1 mg  Oral TID  . [START ON 08/07/2020] darbepoetin (ARANESP) injection - DIALYSIS  150 mcg Intravenous Q Fri-HD  . haloperidol lactate  2 mg Intravenous Once  . insulin aspart  0-5 Units Subcutaneous QHS  . insulin aspart  0-9 Units Subcutaneous TID WC  . insulin aspart  3 Units Subcutaneous TID WC  . insulin detemir  7 Units Subcutaneous BID  . linagliptin  5 mg Oral Daily  . mouth rinse  15 mL Mouth Rinse BID  . mouth rinse  15 mL Mouth Rinse q12n4p  . methylPREDNISolone (SOLU-MEDROL) injection  40 mg Intravenous Q12H  . metoprolol tartrate  12.5 mg Oral BID  . sodium bicarbonate  650 mg Oral TID  . sodium chloride flush  10-40 mL Intracatheter Q12H  . sodium chloride flush  3 mL Intravenous Q12H  . zinc sulfate  220 mg Oral Daily    BMET    Component Value Date/Time   NA 138 08/02/2020 0354   NA 140 05/05/2020 1237   K 4.0 08/02/2020 0354   CL 97 (L) 08/02/2020 0354    CO2 25 08/02/2020 0354   GLUCOSE 123 (H) 08/02/2020 0354   BUN 59 (H) 08/02/2020 0354   BUN 43 (H) 05/05/2020 1237   CREATININE 5.21 (H) 08/02/2020 0354   CALCIUM 9.0 08/02/2020 0354   CALCIUM 8.8 01/15/2018 1005   GFRNONAA 8 (L) 08/02/2020 0354   GFRAA 18 (L) 05/05/2020 1237   CBC    Component Value Date/Time   WBC 25.7 (H) 08/02/2020 0354   RBC 3.71 (L) 08/02/2020 0354   HGB 8.1 (L) 08/02/2020 0354   HGB 9.1 (L) 01/30/2020 1558   HCT 25.7 (L) 08/02/2020 0354   HCT 30.6 (L) 01/30/2020 1558   PLT 313 08/02/2020 0354   PLT 251 01/30/2020 1558   MCV 69.3 (L) 08/02/2020 0354   MCV 72 (L) 01/30/2020 1558   MCH 21.8 (L) 08/02/2020 0354   MCHC 31.5 08/02/2020 0354   RDW 15.7 (H) 08/02/2020 0354   RDW 15.7 (H) 01/30/2020 1558   LYMPHSABS 0.5 (L) 08/02/2020 0354   MONOABS 0.5 08/02/2020 0354   EOSABS 0.0 08/02/2020 0354   BASOSABS 0.1 08/02/2020 0354   Assessment/Plan:  1. AKI on CKD stage IV-  In setting of sepsis due to covid-19 and CAP.  Baseline Scr 2.7-3.  Renal US without obstruction.  No improvement with IVF's and rising BUN/Cr.  her BUN was markedly elevated and her Scr continues to climb.  She was amenable to HD if needed.  IR placed temporary HD catheter 12/22 followed by first HD overnight- second HD 12/24 and third today 12/26.  Her baseline CKD was so advanced that I suspect this illness will leave her with ESRD-- on Monday will need to start working toward OP HD- then next HD on Tuesday 2. Acute hypoxemic respiratory failure due to covid-19 and community acquired PNA- on IV steroids, but not on remdesivir due to AKI and no baricitinib due to bacterial infection. Still on high flow O2 3. Sepsis due to covid-19 and CAP- improving  4. Atrial fib with RVR- started on amiodarone per Cardiology 5. Acute metabolic encephalopathy- likely multifactorial with AKI, covid-19, hypoxia, and sepsis.   6. Anemia of acute illness and CKD stage IV- follow H/H and iron stores. -  added  ESA but hold on iron- hgb stable 7. AGMA- due to #1.  Start bicarb also HD-  stable 8. HTN- stable 9. DM type 2  - per primary. 10. Check  PTH today and treat as needed-  Phos is not terrible without binder at this time    [place] [phone]

## 2020-08-02 NOTE — Progress Notes (Signed)
PROGRESS NOTE    [name] [name]  BTD:[id number] DOB: 02-Dec-1935 DOA: 07/25/2020 PCP: [name], MD     Brief Narrative:  [name] [name] is an 84 year old female with history of RA, CKD 4, DM 2, HTN, HLD, OSA on CPAP, TIA, chronic diastolic CHF.   To the hospital with 5 days of cough, lethargy, confusion. Was found to be hypoxic, tested positive for Covid and was admitted to the hospital. She was also found to have acute kidney injury. She was also found to have A. fib with RVR. Cardiology and nephrology were consulted on admission. Renal function has not improved and the patient is transferred to [facility] in anticipation of dialysis initiation.   Subjective: 12/26 afebrile overnight, A/O x4.   Assessment & Plan: Covid vaccination; unvaccinated   Principal Problem:   Acute hypoxemic respiratory failure due to COVID-19 [facility]) Active Problems:   Essential hypertension   HLD (hyperlipidemia)   DM (diabetes mellitus), type 2 with renal complications (HCC)   Chronic diastolic HF (heart failure) ([place])   CAP (community acquired pneumonia)   CKD (chronic kidney disease), stage IV ([place])   AKI (acute kidney injury) ([place])   Sepsis ([place])   Atrial fibrillation with RVR ([place])  Acute respiratory failure with hypoxia/Covid pneumonia/suspected bacterial superinfection COVID-19 Labs  Recent Labs    07/31/20 0050 08/01/20 0237 08/02/20 0354  DDIMER 1.95* 1.58* 1.87*  FERRITIN 2,665* 2,066* 2,349*  LDH 363* 309* 348*  CRP 14.9* 8.1* 6.9*    Lab Results  Component Value Date   SARSCOV2NAA POSITIVE (A) 07/25/2020  -Patient with CrCl<60ml/min therefore and eligible for Remdesivir or Baricitinib -In addition possible bacterial superinfection initial procalcitonin=Results for [name] [name] (MRN [id number]) as of 07/28/2020 19:45  Ref. Range 07/25/2020 08:40  Procalcitonin Latest Units: ng/mL 2.35  -Trend procalcitonin -Cultures NGTD -12/22 spoke with  pharmacy all antibiotics have been completed.   -Solu-Medrol 40 mg BID -Vitamins per Covid protocol -DuoNeb QID -Flutter valve -Incentive spirometry -  Sepsis pneumonia (Covid 19/suspected CAP) -On admission criteria for sepsis tachycardia, tachypnea, leukocytosis -Cultures obtained, placed on antibiotics.  All physiologic signs of sepsis have resolved.  Elevated D-dimer -Unsure if elevation secondary to worsening disease process for secondary to patient's fluid contraction.  Patient just had HD last night.  Currently no increase in O2 demand. -If D-dimer continues to increase will obtain CTA chest after consulting with nephrologist (patient already on HD if they do not believe patient's kidneys recoverable will proceed).  Otherwise may just treat empirically because currently patient would not be able to cooperate with VQ scan -D-dimer decreased on 12/25 now started back up will hold up 1 more day to see if we have a trend before making decision  Acute on CKD stage IV (baseline Cr 2.4-2.7) -On presentation Cr 6.8 not improving -Transferred to [facility] per nephrology. -Does not currently fluid appear overloaded.  Anion gap metabolic acidosis -Worsening anion gap metabolic acidosis -46/27 1 amp sodium bicarb -12/22 nontunneled HD cath placed  Chronic diastolic CHF -BNP on admission= 259 -Strict in and out +4.9 L -Daily weight Filed Weights   08/01/20 0709 08/02/20 1040 08/02/20 1416  Weight: 68.8 kg 68.5 kg 68.1 kg  -12/22 Metoprolol 12.5 mg BID Hold for MAP<60  A. fib with RVR -CHADS2Vasc score is 7 (HTN, age x 2, DM, TIA x2, gender), Can convert to [place] later in admission -Cardiology consulted -Continue Amiodarone drip -12/22 currently sinus arrhythmia currently NSR -Continue heparin drip -Last seen by cardiology on 12/18  per their note would consider outpatient monitor after systemic illness have resolved. -Cardiology recommends echocardiogram once rate better  controlled. -12/22 Echocardiogram pending  Essential HTN -See CHF  Pulmonary HTN -See CHF  Acute metabolic encephalopathy -56/21 resolved answer questions appropriately follows all commands  Hx TIA -Hold aspirin in the setting of heparin infusion -Lipitor 20 mg daily -12/22 LDL = 57: Goal LDL <70   DM type II uncontrolled with Hyperglycemia -12/22 Hemoglobin A1c= 7.7  -12/22 Levemir 7 units BID -12/22 NovoLog 5 units qac -12/22 moderate SSI  -Tradjenta 5 mg daily  Mixed Anemia -Anemia panel most consistent with a mixed anemia -Occult blood pending      DVT prophylaxis: Heparin drip Code Status: Full Family Communication: 12/26 spoke with [facility] discussed plan of care answered all questions Status is: Inpatient    Dispo: The patient is from:               Anticipated d/c is to: Home              Anticipated d/c date is: 12/30              Patient currently unstable       Consultants:  Nephrology Cardiology  Procedures/Significant Events:  12/22 nontunneled HD catheter terminating within the superior aspect RIGHT atrium 12/23 echocardiogram limited;LVEF= 55 to 60%.  -Grade I diastolic dysfunction  Right Ventricle: Mldly elevated pulmonary artery systolic pressure. estimated  right ventricular systolic pressure is 30.8 mmHg.    I have personally reviewed and interpreted all radiology studies and my findings are as above.  VENTILATOR SETTINGS: HFNC 12/26 Flow; 4 L/min SPO2; 96%   Cultures 12/18 blood RIGHT AC negative 12/18 MRSA by PCR negative 12/18 blood right hand negative final x2    Antimicrobials: Anti-infectives (From admission, onward)   Start     Ordered Stop   07/26/20 1000  cefTRIAXone (ROCEPHIN) 1 g in sodium chloride 0.9 % 100 mL IVPB        07/25/20 1138 07/30/20 0959   07/26/20 1000  doxycycline (VIBRAMYCIN) 100 mg in sodium chloride 0.9 % 250 mL IVPB        07/25/20 1138 07/27/20 2335   07/25/20 0500  levofloxacin  (LEVAQUIN) IVPB 750 mg        07/25/20 0453 07/25/20 0824         Devices    LINES / TUBES:      Continuous Infusions: . amiodarone 30 mg/hr (08/02/20 0848)  . diltiazem (CARDIZEM) infusion Stopped (07/27/20 0233)  . heparin 800 Units/hr (08/02/20 1021)     Objective: Vitals:   08/02/20 1230 08/02/20 1300 08/02/20 1330 08/02/20 1416  BP: 136/60 (!) 150/60 138/68 (!) 160/80  Pulse: 80 80 82 94  Resp: 14 16 16    Temp:    98.1 F (36.7 C)  TempSrc:    Oral  SpO2: 96% 96% 96% 96%  Weight:    68.1 kg  Height:        Intake/Output Summary (Last 24 hours) at 08/02/2020 1434 Last data filed at 08/02/2020 1416 Gross per 24 hour  Intake 705.2 ml  Output 800 ml  Net -94.8 ml   Filed Weights   08/01/20 0709 08/02/20 1040 08/02/20 1416  Weight: 68.8 kg 68.5 kg 68.1 kg   Physical Exam:  General: A/O x4, positive acute respiratory distress Eyes: negative scleral hemorrhage, negative anisocoria, negative icterus ENT: Negative Runny nose, negative gingival bleeding, Neck:  Negative scars, masses, torticollis, lymphadenopathy, JVD Lungs:  decreased breath sounds bilaterally without wheezes or crackles Cardiovascular: Regular rate and rhythm without murmur gallop or rub normal S1 and S2 Abdomen: negative abdominal pain, nondistended, positive soft, bowel sounds, no rebound, no ascites, no appreciable mass Extremities: No significant cyanosis, clubbing, or edema bilateral lower extremities Skin: Negative rashes, lesions, ulcers Psychiatric:  Negative depression, negative anxiety, negative fatigue, negative mania  Central nervous system:  Cranial nerves II through XII intact, tongue/uvula midline, all extremities muscle strength 5/5, sensation intact throughout, negative dysarthria, negative expressive aphasia, negative receptive aphasia.    Data Reviewed: Care during the described time interval was provided by me .  I have reviewed this patient's available data, including  medical history, events of note, physical examination, and all test results as part of my evaluation.  CBC: Recent Labs  Lab 07/29/20 0147 07/30/20 0500 07/31/20 0050 08/01/20 0237 08/02/20 0354  WBC 11.9* 23.1* 14.3* 16.8* 25.7*  NEUTROABS 11.2* 20.4* 12.9* 15.6* 23.9*  HGB 7.7* 8.5* 8.8* 7.0* 8.1*  HCT 23.2* 26.8* 27.1* 21.3* 25.7*  MCV 67.1* 67.3* 66.6* 67.8* 69.3*  PLT 393 425* 353 293 720   Basic Metabolic Panel: Recent Labs  Lab 07/29/20 0147 07/30/20 0500 07/31/20 0050 08/01/20 0237 08/02/20 0354  NA 137 138 140 138 138  K 4.0 3.5 3.7 4.3 4.0  CL 104 99 101 100 97*  CO2 15* 19* 22 26 25   GLUCOSE 233* 99 130* 147* 123*  BUN 137* 85* 96* 42* 59*  CREATININE 8.56* 6.18* 6.72* 3.98* 5.21*  CALCIUM 8.8* 8.8* 8.9 8.0* 9.0  MG 2.1 1.7 1.9 1.6* 1.9  PHOS 6.3* 4.6 6.4* 3.9 5.3*   GFR: Estimated Creatinine Clearance: 7.3 mL/min (A) (by C-G formula based on SCr of 5.21 mg/dL (H)). Liver Function Tests: Recent Labs  Lab 07/29/20 0147 07/30/20 0500 07/31/20 0050 08/01/20 0237 08/02/20 0354  AST 21 32 25 21 19   ALT 17 19 18 14 18   ALKPHOS 56 61 66 49 72  BILITOT 0.8 0.8 0.9 0.6 0.8  PROT 6.6 7.3 7.0 5.3* 6.6  ALBUMIN 2.5* 2.8* 2.7* 2.1* 2.6*   No results for input(s): LIPASE, AMYLASE in the last 168 hours. No results for input(s): AMMONIA in the last 168 hours. Coagulation Profile: No results for input(s): INR, PROTIME in the last 168 hours. Cardiac Enzymes: No results for input(s): CKTOTAL, CKMB, CKMBINDEX, TROPONINI in the last 168 hours. BNP (last 3 results) No results for input(s): PROBNP in the last 8760 hours. HbA1C: No results for input(s): HGBA1C in the last 72 hours. CBG: Recent Labs  Lab 08/01/20 1759 08/01/20 1949 08/01/20 2309 08/02/20 0528 08/02/20 0722  GLUCAP 76 161* 119* 118* 108*   Lipid Profile: No results for input(s): CHOL, HDL, LDLCALC, TRIG, CHOLHDL, LDLDIRECT in the last 72 hours. Thyroid Function Tests: No results for  input(s): TSH, T4TOTAL, FREET4, T3FREE, THYROIDAB in the last 72 hours. Anemia Panel: Recent Labs    08/01/20 0237 08/02/20 0354  FERRITIN 2,066* 2,349*   Sepsis Labs: Recent Labs  Lab 07/28/20 2043 07/29/20 0147 07/30/20 0500  PROCALCITON 0.96 0.84 0.52    Recent Results (from the past 240 hour(s))  Resp Panel by RT-PCR (Flu A&B, Covid) Nasopharyngeal Swab     Status: Abnormal   Collection Time: 07/25/20  1:56 AM   Specimen: Nasopharyngeal Swab; Nasopharyngeal(NP) swabs in vial transport medium  Result Value Ref Range Status   SARS Coronavirus 2 by RT PCR POSITIVE (A) NEGATIVE Final    Comment: CRITICAL RESULT CALLED TO, READ  BACK BY AND VERIFIED WITH: Burnis Medin RN 07/25/20 @0603  BY P.HENDERSON (NOTE) SARS-CoV-2 target nucleic acids are DETECTED.  The SARS-CoV-2 RNA is generally detectable in upper respiratory specimens during the acute phase of infection. Positive results are indicative of the presence of the identified virus, but do not rule out bacterial infection or co-infection with other pathogens not detected by the test. Clinical correlation with patient history and other diagnostic information is necessary to determine patient infection status. The expected result is Negative.  Fact Sheet for Patients: EntrepreneurPulse.com.au  Fact Sheet for Healthcare Providers: IncredibleEmployment.be  This test is not yet approved or cleared by the [place] FDA and  has been authorized for detection and/or diagnosis of SARS-CoV-2 by FDA under an Emergency Use Authorization (EUA).  This EUA will remain in effect (me aning this test can be used) for the duration of  the COVID-19 declaration under Section 564(b)(1) of the Act, 21 U.S.C. section 360bbb-3(b)(1), unless the authorization is terminated or revoked sooner.     Influenza A by PCR NEGATIVE NEGATIVE Final   Influenza B by PCR NEGATIVE NEGATIVE Final    Comment:  (NOTE) The Xpert Xpress SARS-CoV-2/FLU/RSV plus assay is intended as an aid in the diagnosis of influenza from Nasopharyngeal swab specimens and should not be used as a sole basis for treatment. Nasal washings and aspirates are unacceptable for Xpert Xpress SARS-CoV-2/FLU/RSV testing.  Fact Sheet for Patients: EntrepreneurPulse.com.au  Fact Sheet for Healthcare Providers: IncredibleEmployment.be  This test is not yet approved or cleared by the [place] FDA and has been authorized for detection and/or diagnosis of SARS-CoV-2 by FDA under an Emergency Use Authorization (EUA). This EUA will remain in effect (meaning this test can be used) for the duration of the COVID-19 declaration under Section 564(b)(1) of the Act, 21 U.S.C. section 360bbb-3(b)(1), unless the authorization is terminated or revoked.  Performed at [facility], [place] [address] [place], [place] [address]   Culture, blood (single)     Status: None   Collection Time: 07/25/20  6:19 AM   Specimen: BLOOD  Result Value Ref Range Status   Specimen Description   Final    BLOOD RIGHT ANTECUBITAL Performed at [place] [address], [place], [place] [address]    Special Requests   Final    BOTTLES DRAWN AEROBIC AND ANAEROBIC Blood Culture adequate volume Performed at [place] [address] [place], [place] [address]    Culture   Final    NO GROWTH 5 DAYS Performed at [facility], [place] [address], [place], [place] [address]    Report Status 07/30/2020 FINAL  Final  MRSA PCR Screening     Status: None   Collection Time: 07/25/20 11:58 AM   Specimen: Nasopharyngeal  Result Value Ref Range Status   MRSA by PCR NEGATIVE NEGATIVE Final    Comment:        The GeneXpert MRSA Assay (FDA approved for NASAL specimens only), is one component of a comprehensive MRSA colonization surveillance program. It is  not intended to diagnose MRSA infection nor to guide or monitor treatment for MRSA infections. Performed at [facility], [place] [address], [place], [place] [address]   Blood Culture (routine x 2)     Status: None   Collection Time: 07/25/20  4:18 PM   Specimen: BLOOD  Result Value Ref Range Status   Specimen Description   Final    BLOOD RIGHT HAND Performed  at [facility], [place] [address] [place], [place] [address]    Special Requests   Final    BOTTLES DRAWN AEROBIC ONLY Blood Culture results may not be optimal due to an inadequate volume of blood received in culture bottles Performed at [place] [address] [place], [place] [address]    Culture   Final    NO GROWTH 5 DAYS Performed at [facility], [place] [address], [place], [place] [address]    Report Status 07/30/2020 FINAL  Final  Blood Culture (routine x 2)     Status: None   Collection Time: 07/25/20  4:18 PM   Specimen: BLOOD  Result Value Ref Range Status   Specimen Description   Final    BLOOD RIGHT HAND Performed at [facility] [address], [place], [place] [address]    Special Requests   Final    BOTTLES DRAWN AEROBIC ONLY Blood Culture adequate volume Performed at [place] [address], [place],  [address]    Culture   Final    NO GROWTH 5 DAYS Performed at [facility], [place] [address] [place],  [address]    Report Status 07/30/2020 FINAL  Final         Radiology Studies: DG CHEST PORT 1 VIEW  Result Date: 08/01/2020 CLINICAL DATA:  Pneumonia EXAM: PORTABLE CHEST 1 VIEW COMPARISON:  07/25/2020 FINDINGS: Stable low volume chest with patchy bilateral airspace disease. Cardiomegaly accentuated by rotation. Dialysis catheter with tip at the upper right atrium. No visible effusion or air leak. IMPRESSION: Stable low volume chest with bilateral pneumonia. Electronically  Signed   By: Monte Fantasia M.D.   On: 08/01/2020 07:49        Scheduled Meds: . heparin sodium (porcine)      . amiodarone  150 mg Intravenous Once  . amLODipine  10 mg Oral Daily  . vitamin C  500 mg Oral Daily  . atorvastatin  20 mg Oral q1800  . chlorhexidine  15 mL Mouth Rinse BID  . Chlorhexidine Gluconate Cloth  6 each Topical Q0600  . cloNIDine  0.1 mg Oral TID  . [START ON 08/07/2020] darbepoetin (ARANESP) injection - DIALYSIS  150 mcg Intravenous Q Fri-HD  . haloperidol lactate  2 mg Intravenous Once  . insulin aspart  0-5 Units Subcutaneous QHS  . insulin aspart  0-9 Units Subcutaneous TID WC  . insulin aspart  3 Units Subcutaneous TID WC  . insulin detemir  7 Units Subcutaneous BID  . linagliptin  5 mg Oral Daily  . mouth rinse  15 mL Mouth Rinse BID  . mouth rinse  15 mL Mouth Rinse q12n4p  . methylPREDNISolone (SOLU-MEDROL) injection  40 mg Intravenous Q12H  . metoprolol tartrate  12.5 mg Oral BID  . sodium bicarbonate  650 mg Oral TID  . sodium chloride flush  10-40 mL Intracatheter Q12H  . sodium chloride flush  3 mL Intravenous Q12H  . zinc sulfate  220 mg Oral Daily   Continuous Infusions: . amiodarone 30 mg/hr (08/02/20 0848)  . diltiazem (CARDIZEM) infusion Stopped (07/27/20 0233)  . heparin 800 Units/hr (08/02/20 1021)     LOS: 8 days    Time spent:40 min    [name] [name], Geraldo Docker, MD [facility] Pager [phone]  If 7PM-7AM, please contact night-coverage [email or web address] Password [facility] 08/02/2020, 2:34 PM

## 2020-08-02 NOTE — Progress Notes (Signed)
CBG is 64 at 2039 and she had one ensure (230 ml) with 2 packs of sugar at 2100. Patient denies any discomfort. No symptoms of hypoglycemia at this time.  At 2135, CBG is 74  At 2249, CBG is 113 - Dr. Clearence Ped notified and hold Levemir 7 units tonight. Will continue to assess.

## 2020-08-02 NOTE — Progress Notes (Signed)
Pt transported to dialysis

## 2020-08-02 NOTE — Progress Notes (Signed)
[place] for Heparin Indication: atrial fibrillation  Allergies  Allergen Reactions  . Penicillins Swelling and Rash    Patient Measurements: Height: 5\' 1"  (154.9 cm) Weight: 68.8 kg (151 lb 10.8 oz) IBW/kg (Calculated) : 47.8 Heparin Dosing Weight: 60 kg  Vital Signs: Temp: 99 F (37.2 C) (12/26 0725) Temp Source: Oral (12/26 0725) BP: 164/70 (12/26 0725) Pulse Rate: 97 (12/26 0725)  Labs: Recent Labs    07/31/20 0050 07/31/20 0858 08/01/20 0237 08/02/20 0354  HGB 8.8*  --  7.0* 8.1*  HCT 27.1*  --  21.3* 25.7*  PLT 353  --  293 313  HEPARINUNFRC  --  0.45 0.30 0.31  CREATININE 6.72*  --  3.98* 5.21*    Estimated Creatinine Clearance: 7.4 mL/min (A) (by C-G formula based on SCr of 5.21 mg/dL (H)).  Assessment 84 y.o. female with Afib continued on heparin. Patient is therapeutic this morning with HL 0.3. Hgb is up to 8.1 and Hct to 25.7. Pltc remains wnl. No signs of bleeding at this time per nursing. Will increase rate slightly to keep within range and recheck level tomorrow morning.   Goal of Therapy:  Heparin level 0.3-0.7 units/ml Monitor platelets by anticoagulation protocol: Yes   Plan:  Increase heparin to 800 units/hr  Daily HL and CBC  F/u plan for LT [facility]   Claudina Lick, PharmD PGY1 [place] Resident 08/02/2020 7:45 AM  Please check [email or web address] for unit-specific pharmacy phone numbers.

## 2020-08-03 DIAGNOSIS — N179 Acute kidney failure, unspecified: Secondary | ICD-10-CM | POA: Diagnosis not present

## 2020-08-03 DIAGNOSIS — I4891 Unspecified atrial fibrillation: Secondary | ICD-10-CM | POA: Diagnosis not present

## 2020-08-03 DIAGNOSIS — J9601 Acute respiratory failure with hypoxia: Secondary | ICD-10-CM | POA: Diagnosis not present

## 2020-08-03 DIAGNOSIS — U071 COVID-19: Secondary | ICD-10-CM | POA: Diagnosis not present

## 2020-08-03 LAB — COMPREHENSIVE METABOLIC PANEL
ALT: 18 U/L (ref 0–44)
AST: 19 U/L (ref 15–41)
Albumin: 2.4 g/dL — ABNORMAL LOW (ref 3.5–5.0)
Alkaline Phosphatase: 72 U/L (ref 38–126)
Anion gap: 12 (ref 5–15)
BUN: 38 mg/dL — ABNORMAL HIGH (ref 8–23)
CO2: 27 mmol/L (ref 22–32)
Calcium: 8.4 mg/dL — ABNORMAL LOW (ref 8.9–10.3)
Chloride: 98 mmol/L (ref 98–111)
Creatinine, Ser: 4.06 mg/dL — ABNORMAL HIGH (ref 0.44–1.00)
GFR, Estimated: 10 mL/min — ABNORMAL LOW (ref >60)
Glucose, Bld: 313 mg/dL — ABNORMAL HIGH (ref 70–99)
Potassium: 3.9 mmol/L (ref 3.5–5.1)
Sodium: 137 mmol/L (ref 135–145)
Total Bilirubin: 0.2 mg/dL — ABNORMAL LOW (ref 0.3–1.2)
Total Protein: 5.9 g/dL — ABNORMAL LOW (ref 6.5–8.1)

## 2020-08-03 LAB — C-REACTIVE PROTEIN: CRP: 4.1 mg/dL — ABNORMAL HIGH (ref <1.0)

## 2020-08-03 LAB — HEPARIN LEVEL (UNFRACTIONATED): Heparin Unfractionated: 0.39 IU/mL (ref 0.30–0.70)

## 2020-08-03 LAB — D-DIMER, QUANTITATIVE: D-Dimer, Quant: 1.85 ug/mL-FEU — ABNORMAL HIGH (ref 0.00–0.50)

## 2020-08-03 LAB — FERRITIN: Ferritin: 2115 ng/mL — ABNORMAL HIGH (ref 11–307)

## 2020-08-03 LAB — GLUCOSE, CAPILLARY
Glucose-Capillary: 296 mg/dL — ABNORMAL HIGH (ref 70–99)
Glucose-Capillary: 233 mg/dL — ABNORMAL HIGH (ref 70–99)
Glucose-Capillary: 116 mg/dL — ABNORMAL HIGH (ref 70–99)
Glucose-Capillary: 84 mg/dL (ref 70–99)

## 2020-08-03 LAB — MAGNESIUM: Magnesium: 1.9 mg/dL (ref 1.7–2.4)

## 2020-08-03 LAB — LACTATE DEHYDROGENASE: LDH: 330 U/L — ABNORMAL HIGH (ref 98–192)

## 2020-08-03 LAB — PHOSPHORUS: Phosphorus: 4 mg/dL (ref 2.5–4.6)

## 2020-08-03 MED ORDER — METOPROLOL TARTRATE 12.5 MG HALF TABLET
12.5000 mg | ORAL_TABLET | Freq: Two times a day (BID) | ORAL | Status: DC
  Administered 2020-08-03 – 2020-08-14 (×21): 12.5 mg via ORAL
  Filled 2020-08-03 (×21): qty 1

## 2020-08-03 MED ORDER — CHLORHEXIDINE GLUCONATE CLOTH 2 % EX PADS
6.0000 | MEDICATED_PAD | Freq: Every day | CUTANEOUS | Status: DC
  Administered 2020-08-04 – 2020-08-05 (×2): 6 via TOPICAL

## 2020-08-03 MED ORDER — INSULIN ASPART 100 UNIT/ML ~~LOC~~ SOLN
6.0000 [IU] | Freq: Three times a day (TID) | SUBCUTANEOUS | Status: DC
  Administered 2020-08-05 (×2): 6 [IU] via SUBCUTANEOUS

## 2020-08-03 MED ORDER — HYDRALAZINE HCL 20 MG/ML IJ SOLN
10.0000 mg | Freq: Three times a day (TID) | INTRAMUSCULAR | Status: DC | PRN
  Administered 2020-08-03 – 2020-08-04 (×3): 10 mg via INTRAVENOUS
  Filled 2020-08-03 (×3): qty 1

## 2020-08-03 MED ORDER — AMIODARONE HCL 200 MG PO TABS
200.0000 mg | ORAL_TABLET | Freq: Every day | ORAL | Status: DC
Start: 2020-08-03 — End: 2020-08-14
  Administered 2020-08-03 – 2020-08-14 (×12): 200 mg via ORAL
  Filled 2020-08-03 (×12): qty 1

## 2020-08-03 NOTE — Progress Notes (Signed)
[facility] NEPHROLOGY PROGRESS NOTE  Assessment/ Plan:  # AKIon CKD stage IV, dialysis dependent and now progressed to new ESRD: In setting of sepsis due to covid-19 and CAP. Baseline Scr 2.7-3. Renal US without obstruction but chronicity. No improvement with IVF's and rising BUN/Cr.  Started dialysis on 12/22 after placement of temporary HD catheter.  Since then she has been tolerating dialysis well.  Last HD on 12/26 with 500 cc UF.  No sign of renal recovery so far. I will consult IR for conversion to tunneled HD catheter Social worker consult for outpatient arrangement of dialysis for ESRD. Plan for next HD tomorrow.  Discussed with the patient and she agreed with above.  #Acute hypoxemic respiratory failure due to covid-19 and community acquired PNA- on IV steroids, but not on remdesivir due to AKI and no baricitinib due to bacterial infection. Still on high flow O2.  # Sepsis due to covid-19 and CAP- improving.  # Atrial fib with RVR- started on amiodarone per Cardiology.  # Acute metabolic encephalopathy- likely multifactorial with AKI, covid-19, hypoxia, and sepsis.   # Anemia of acute illness and CKD stage IV- follow H/H and iron stores. -  added ESA but hold on iron- hgb stable.  # HTN- stable.  Continue amlodipine.  #Metabolic acidosis: Now managed with dialysis.  Discontinue sodium bicarbonate.  Subjective: Seen and examined in Covid unit.  Sitting on chair comfortable.  Denies nausea vomiting chest pain shortness of breath.  Urine output around 200 cc only.  No sign of renal recovery and has been requiring dialysis.  She understands and agreed to continue HD outpatient. Objective Vital signs in last 24 hours: Vitals:   08/03/20 0423 08/03/20 0440 08/03/20 0745 08/03/20 1145  BP: (!) 163/77 (!) 147/83 (!) 173/76 (!) 171/80  Pulse: (!) 104 86 92 88  Resp: 18 20 20 20   Temp: 98.1 F (36.7 C)  98.8 F (37.1 C) 98.1 F (36.7 C)  TempSrc: Oral   Axillary Axillary  SpO2: (!) 89% 91% 95% 95%  Weight:      Height:       Weight change: -0.3 kg  Intake/Output Summary (Last 24 hours) at 08/03/2020 1353 Last data filed at 08/03/2020 0900 Gross per 24 hour  Intake 854.45 ml  Output 700 ml  Net 154.45 ml       Labs: Basic Metabolic Panel: Recent Labs  Lab 08/01/20 0237 08/02/20 0354 08/03/20 0254 08/03/20 1108  NA 138 138  --  137  K 4.3 4.0  --  3.9  CL 100 97*  --  98  CO2 26 25  --  27  GLUCOSE 147* 123*  --  313*  BUN 42* 59*  --  38*  CREATININE 3.98* 5.21*  --  4.06*  CALCIUM 8.0* 9.0  --  8.4*  PHOS 3.9 5.3* 4.0  --    Liver Function Tests: Recent Labs  Lab 08/01/20 0237 08/02/20 0354 08/03/20 1108  AST 21 19 19   ALT 14 18 18   ALKPHOS 49 72 72  BILITOT 0.6 0.8 0.2*  PROT 5.3* 6.6 5.9*  ALBUMIN 2.1* 2.6* 2.4*   No results for input(s): LIPASE, AMYLASE in the last 168 hours. No results for input(s): AMMONIA in the last 168 hours. CBC: Recent Labs  Lab 07/29/20 0147 07/30/20 0500 07/31/20 0050 08/01/20 0237 08/02/20 0354  WBC 11.9* 23.1* 14.3* 16.8* 25.7*  NEUTROABS 11.2* 20.4* 12.9* 15.6* 23.9*  HGB 7.7* 8.5* 8.8* 7.0* 8.1*  HCT 23.2*  26.8* 27.1* 21.3* 25.7*  MCV 67.1* 67.3* 66.6* 67.8* 69.3*  PLT 393 425* 353 293 313   Cardiac Enzymes: No results for input(s): CKTOTAL, CKMB, CKMBINDEX, TROPONINI in the last 168 hours. CBG: Recent Labs  Lab 08/02/20 2135 08/02/20 2249 08/02/20 2347 08/03/20 0753 08/03/20 1148  GLUCAP 74 113* 130* 296* 233*    Iron Studies:  Recent Labs    08/02/20 0354  FERRITIN 2,349*   Studies/Results: No results found.  Medications: Infusions: . diltiazem (CARDIZEM) infusion Stopped (07/27/20 0233)  . heparin 800 Units/hr (08/03/20 0400)    Scheduled Medications: . amiodarone  200 mg Oral Daily  . amLODipine  10 mg Oral Daily  . vitamin C  500 mg Oral Daily  . atorvastatin  20 mg Oral q1800  . chlorhexidine  15 mL Mouth Rinse BID  .  Chlorhexidine Gluconate Cloth  6 each Topical Q0600  . cloNIDine  0.1 mg Oral TID  . [START ON 08/07/2020] darbepoetin (ARANESP) injection - DIALYSIS  150 mcg Intravenous Q Fri-HD  . haloperidol lactate  2 mg Intravenous Once  . insulin aspart  0-5 Units Subcutaneous QHS  . insulin aspart  0-9 Units Subcutaneous TID WC  . insulin aspart  3 Units Subcutaneous TID WC  . insulin detemir  7 Units Subcutaneous BID  . linagliptin  5 mg Oral Daily  . mouth rinse  15 mL Mouth Rinse BID  . mouth rinse  15 mL Mouth Rinse q12n4p  . methylPREDNISolone (SOLU-MEDROL) injection  40 mg Intravenous Q12H  . metoprolol tartrate  12.5 mg Oral BID  . sodium bicarbonate  650 mg Oral TID  . sodium chloride flush  10-40 mL Intracatheter Q12H  . sodium chloride flush  3 mL Intravenous Q12H  . zinc sulfate  220 mg Oral Daily    have reviewed scheduled and prn medications.  Physical Exam: General:NAD, comfortable Heart:RRR, s1s2 nl Lungs: Basal rhonchi, no wheezes Abdomen:soft, Non-tender, non-distended Extremities:No edema Dialysis Access: IJ temporary HD catheter  [name] [name] 08/03/2020,1:53 PM  LOS: 9 days  Pager: [phone]

## 2020-08-03 NOTE — Progress Notes (Signed)
[place] for Heparin Indication: atrial fibrillation  Allergies  Allergen Reactions  . Penicillins Swelling and Rash    Patient Measurements: Height: 5\' 1"  (154.9 cm) Weight: 68.1 kg (150 lb 2.1 oz) IBW/kg (Calculated) : 47.8 Heparin Dosing Weight: 60 kg  Vital Signs: Temp: 98.1 F (36.7 C) (12/27 1145) Temp Source: Axillary (12/27 1145) BP: 171/80 (12/27 1145) Pulse Rate: 88 (12/27 1145)  Labs: Recent Labs    08/01/20 0237 08/02/20 0354 08/03/20 0254 08/03/20 1108  HGB 7.0* 8.1*  --   --   HCT 21.3* 25.7*  --   --   PLT 293 313  --   --   HEPARINUNFRC 0.30 0.31 0.39  --   CREATININE 3.98* 5.21*  --  4.06*    Estimated Creatinine Clearance: 9.4 mL/min (A) (by C-G formula based on SCr of 4.06 mg/dL (H)).  Assessment 84 y.o. female with Afib continued on heparin. Patient is therapeutic this morning with HL 0.3. Hgb is up to 8.1 and Hct to 25.7. Pltc remains wnl. No signs of bleeding at this time per nursing. Will increase rate slightly to keep within range and recheck level tomorrow morning.   HL cont to be therapeutic. Plan eventually for NOAC once procedures are completed. Hgb remains low.   Goal of Therapy:  Heparin level 0.3-0.7 units/ml Monitor platelets by anticoagulation protocol: Yes   Plan:  Cont heparin 800 units/hr  Daily HL and CBC  F/u plan for LT [facility]   Onnie Boer, PharmD, BCIDP, AAHIVP, CPP Infectious Disease Pharmacist 08/03/2020 2:06 PM

## 2020-08-03 NOTE — TOC Progression Note (Signed)
Transition of Care [facility]) - Progression Note    Patient Details  Name: [name] [name] MRN: [id number] Date of Birth: 1936/06/18  Transition of Care [facility]) CM/SW [place], LCSW Phone Number: 08/03/2020, 12:42 PM  Clinical Narrative:    CSW received return call from patient's daughter, Carylon. She reported that she is patient's caregiver and she does not want SNF placement for patient. She will be trading off care giving duties with her siblings and requests home health services. She reported no preference as [facility] as it is in network with insurance. CSW discussed equipment needs and she requested a 3in1 bedside commode and oxygen (has rolling walker w/seat at home and bath seat). CSW confirmed PCP and address. Patient's daughter aware of need to transport to dialysis three times a week once center is determined. No further questions reported at this time.        Barriers to Discharge: Continued Medical Work up,Insurance Authorization,SNF Pending bed offer  Expected Discharge Plan and Services   In-house Referral: Clinical Social Work     Living arrangements for the past 2 months: Apartment                                       Social Determinants of Health (SDOH) Interventions    Readmission Risk Interventions No flowsheet data found.

## 2020-08-03 NOTE — TOC Initial Note (Signed)
Transition of Care [facility]) - Initial/Assessment Note    Patient Details  Name: [name] [name] MRN: [id number] Date of Birth: 11-05-1935  Transition of Care [facility]) CM/SW Contact:    Benard Halsted, LCSW Phone Number: 08/03/2020, 12:17 PM  Clinical Narrative:                 CSW received SNF consult. CSW left voicemail for patient's daughter, [name] [name]. If family requests SNF placement, potential barrier includes lack of a COVID SNF in network with [employer] with the exception of [place] (outpatient dialysis center would need to be addressed). Will continue to follow.     Barriers to Discharge: Continued Medical Work up,Insurance Authorization,SNF Pending bed offer   Patient Goals and CMS Choice   CMS Medicare.gov Compare Post Acute Care list provided to:: Patient Represenative (must comment) Choice offered to / list presented to : Adult Children  Expected Discharge Plan and Services   In-house Referral: Clinical Social Work     Living arrangements for the past 2 months: Apartment                                      Prior Living Arrangements/Services Living arrangements for the past 2 months: Apartment Lives with:: Self Patient language and need for interpreter reviewed:: Yes Do you feel safe going back to the place where you live?: Yes      Need for Family Participation in Patient Care: Yes (Comment) Care giver support system in place?: Yes (comment)   Criminal Activity/Legal Involvement Pertinent to Current Situation/Hospitalization: No - Comment as needed  Activities of Daily Living Home Assistive Devices/Equipment: None ADL Screening (condition at time of admission) Patient's cognitive ability adequate to safely complete daily activities?: No Is the patient deaf or have difficulty hearing?: No Does the patient have difficulty seeing, even when wearing glasses/contacts?: No Does the patient have difficulty concentrating, remembering, or making  decisions?: Yes Patient able to express need for assistance with ADLs?: Yes Does the patient have difficulty dressing or bathing?: Yes Independently performs ADLs?: No Does the patient have difficulty walking or climbing stairs?: Yes Weakness of Legs: Both Weakness of Arms/Hands: Both  Permission Sought/Granted Permission sought to share information with : Facility Contact Representative,Family Supports Permission granted to share information with : No        Permission granted to share info w Relationship: Daughters     Emotional Assessment   Attitude/Demeanor/Rapport: Unable to Assess Affect (typically observed): Unable to Assess Orientation: : Oriented to Self,Oriented to Place Alcohol / Substance Use: Not Applicable Psych Involvement: No (comment)  Admission diagnosis:  Cough [R05.9] AKI (acute kidney injury) ([name]) [N17.9] Diabetic ketoacidosis without coma associated with type 2 diabetes mellitus ([place]) [E11.10] Acute hypoxemic respiratory failure ([place]) [J96.01] Acute hypoxemic respiratory failure due to COVID-19 ([place]) [U07.1, J96.01] Patient Active Problem List   Diagnosis Date Noted  . Acute hypoxemic respiratory failure due to COVID-19 ([place]) 07/25/2020  . CAP (community acquired pneumonia) 07/25/2020  . CKD (chronic kidney disease), stage IV ([place]) 07/25/2020  . AKI (acute kidney injury) ([place]) 07/25/2020  . Sepsis ([place]) 07/25/2020  . Atrial fibrillation with RVR ([place]) 07/25/2020  . 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 Chronic idiopathic constipation 09/30/2018  . Chronic diastolic HF (heart failure) ([place]) 05/26/2015  . Type 2 diabetes mellitus with nephropathy ([place]) 05/26/2015  . CHF (congestive heart failure) (  [place]) 05/25/2015  . Anasarca 05/25/2015  . DM (diabetes mellitus), type 2 with renal complications ([place]) 46/19/0122  . Chronic anemia 05/25/2015  . Hypocalcemia   . Encephalopathy, hypertensive 06/11/2014  . Essential  hypertension 06/11/2014  . Type 2 diabetes mellitus without complication ([place]) 24/06/4642  . HLD (hyperlipidemia) 06/11/2014  . OSA (obstructive sleep apnea) 03/06/2014  . Chronic renal disease, stage III ([place]) 01/13/2014  . TIA (transient ischemic attack) 01/11/2014  . Hypertensive urgency 01/11/2014  . Diabetes mellitus ([place]) 01/11/2014   PCP:  [name], MD Pharmacy:   [facility] [place], [place] - [place] [address] [place] [address] Phone: [phone] Fax: [phone]     Social Determinants of Health (SDOH) Interventions    Readmission Risk Interventions No flowsheet data found.

## 2020-08-03 NOTE — Progress Notes (Signed)
Occupational Therapy Treatment Patient Details Name: [name] [name] MRN: [id number] DOB: 1936/04/19 Today's Date: 08/03/2020    History of present illness Pt is an 84 y.o. female admitted 07/25/20 with 5 days of cough, lethargy, confusion; found to be hypoxic and tested (+) COVID-19, also with AKI, afib with RVR, sepsis. Likely plan for temporary HD cath placed and HD initiation 12/22. PMH includes RA, CKD 4, DM2, HTN, OSA on CPAP, TIA, CHF.   OT comments  Pt seen for OT follow up session with focus on ADL mobility progression. Pt presented with [place] out of nose, and attempting to put pulse ox up nose. SpO2 reading 83% on RA. Reapplied 3L [place] with SpO2 back up >90%. Pt then completed short household mobility in session with min A with RW and max problem solving clues. Pt required simple step by step cues to complete basic tasks, as well as physical assist to guide RW (unaware she was running into objects). When asked current date, pt stating her birthday. She was not aware it was just Christmas. Required choice cues to orient self to place as well. SpO2 stable on 3L [place] with mobility. Per chart review, family wanting to take pt home. She will require 24/7 supervision for safety with cognitive deficits. Will continue to follow as acutely admitted.   Follow Up Recommendations  Home health OT;Supervision/Assistance - 24 hour (family refusing SNF)    Equipment Recommendations  3 in 1 bedside commode    Recommendations for Other Services      Precautions / Restrictions Precautions Precautions: Fall Restrictions Weight Bearing Restrictions: No       Mobility Bed Mobility               General bed mobility comments: up in chair, returned to chair  Transfers Overall transfer level: Needs assistance Equipment used: Rolling walker (2 wheeled) Transfers: Sit to/from Stand Sit to Stand: Min guard         General transfer comment: close guard for safety    Balance Overall  balance assessment: Needs assistance Sitting-balance support: Feet supported Sitting balance-Leahy Scale: Good     Standing balance support: Bilateral upper extremity supported;During functional activity Standing balance-Leahy Scale: Poor Standing balance comment: Reliant on UE support                           ADL either performed or assessed with clinical judgement   ADL Overall ADL's : Needs assistance/impaired     Grooming: Moderate assistance;Standing;Cueing for sequencing;Cueing for safety Grooming Details (indicate cue type and reason): cue for sequencing and problem solving                 Toilet Transfer: Minimal assistance;RW;Ambulation;Cueing for safety;Cueing for sequencing;Moderate assistance Toilet Transfer Details (indicate cue type and reason): physical cues needede to navigate RW in and out of bathroom. Once to toilet, pt stating she does not need to go and needs to sit down. mod physical cues then given to navigate pt safely out of bathroom and to chair to sit and rest         Functional mobility during ADLs: Moderate assistance;Cueing for safety;Cueing for sequencing;Rolling walker General ADL Comments: increased confusion, very pleasant, fatigues quickly     Vision Patient Visual Report: No change from baseline     Perception     Praxis      Cognition Arousal/Alertness: Awake/alert Behavior During Therapy: Flat affect Overall Cognitive Status: Impaired/Different from baseline Area  of Impairment: Attention;Memory;Following commands;Awareness;Problem solving;Orientation;Safety/judgement                 Orientation Level: Disoriented to;Time;Situation Current Attention Level: Sustained Memory: Decreased short-term memory Following Commands: Follows one step commands with increased time;Follows one step commands inconsistently Safety/Judgement: Decreased awareness of safety;Decreased awareness of deficits Awareness:  Intellectual Problem Solving: Slow processing;Decreased initiation;Difficulty sequencing;Requires verbal cues;Requires tactile cues General Comments: When asked the date, pt repeating her birthday. Often using inappropriate words for current context. Requires mod-max cue (does best with one step) to navigate ADL environment for simple daily tasks. Often inappropriately laughing and stating "I love you" to this therapist        Exercises     Shoulder Instructions       General Comments      Pertinent Vitals/ Pain       Pain Assessment: No/denies pain  Home Living                                          Prior Functioning/Environment              Frequency  Min 2X/week        Progress Toward Goals  OT Goals(current goals can now be found in the care plan section)  Progress towards OT goals: Progressing toward goals  Acute Rehab OT Goals Patient Stated Goal: family wanting pt to return home OT Goal Formulation: Patient unable to participate in goal setting Time For Goal Achievement: 08/12/20 Potential to Achieve Goals: Good  Plan Discharge plan remains appropriate    Co-evaluation                 AM-PAC OT "6 Clicks" Daily Activity     Outcome Measure   Help from another person eating meals?: A Little Help from another person taking care of personal grooming?: A Lot Help from another person toileting, which includes using toliet, bedpan, or urinal?: A Lot Help from another person bathing (including washing, rinsing, drying)?: A Lot Help from another person to put on and taking off regular upper body clothing?: A Lot Help from another person to put on and taking off regular lower body clothing?: A Lot 6 Click Score: 13    End of Session Equipment Utilized During Treatment: Gait belt;Rolling walker;Oxygen  OT Visit Diagnosis: Unsteadiness on feet (R26.81);Other abnormalities of gait and mobility (R26.89);Muscle weakness (generalized)  (M62.81);Other symptoms and signs involving cognitive function   Activity Tolerance Patient tolerated treatment well   Patient Left in chair;with call bell/phone within reach;with chair alarm set   Nurse Communication Mobility status        Time: 9767-3419 OT Time Calculation (min): 29 min  Charges: OT General Charges $OT Visit: 1 Visit OT Treatments $Self Care/Home Management : 23-37 mins  Zenovia Jarred, MSOT, OTR/L [facility] Office Number: [phone] Pager: [phone]  Zenovia Jarred 08/03/2020, 5:57 PM

## 2020-08-03 NOTE — Progress Notes (Addendum)
VAST consult to change patient's [place] dressing after patient ripped it and the Biopatch off. Upon arriving to unit, pt's nurse reported she had placed a new Biopatch and tegaderm over site and patient had ripped that off just as I was arriving. Patient had mitts in place bilaterally when she ripped off dressing and biopatch. Cleaned site extremely well using Chlorhexadine scrub; allowed it to dry and scrubbed site again with a second chlorhexadine swab; allowed it to dry before applying skin prep. Also placed SecurePort at insertion site and 2 sites where stiches were in place. After all components allowed to dry, Biopatch placed around catheter and new dressing applied.  Spoke with patient and reminded her she should not touch, scratch or pull on dressing to which she promised she would not touch it. Patient with mitts on each hand.  Unit RN notified.

## 2020-08-03 NOTE — Progress Notes (Signed)
PROGRESS NOTE    [name] [name]  FOY:[id number] DOB: 1935-10-14 DOA: 07/25/2020 PCP: [name], MD     Brief Narrative:  [name] [name] is an 84 year old female with history of RA, CKD 4, DM 2, HTN, HLD, OSA on CPAP, TIA, chronic diastolic CHF.   To the hospital with 5 days of cough, lethargy, confusion. Was found to be hypoxic, tested positive for Covid and was admitted to the hospital. She was also found to have acute kidney injury. She was also found to have A. fib with RVR. Cardiology and nephrology were consulted on admission. Renal function has not improved and the patient is transferred to [facility] in anticipation of dialysis initiation.   Subjective: 12/27 afebrile overnight A/O x4 patient voices being being upset that she is in the hospital.   Assessment & Plan: Covid vaccination; unvaccinated   Principal Problem:   Acute hypoxemic respiratory failure due to COVID-19 [facility]) Active Problems:   Essential hypertension   HLD (hyperlipidemia)   DM (diabetes mellitus), type 2 with renal complications ([place])   Chronic diastolic HF (heart failure) ([place])   CAP (community acquired pneumonia)   CKD (chronic kidney disease), stage IV ([place])   AKI (acute kidney injury) ([place])   Sepsis ([place])   Atrial fibrillation with RVR ([place])  Acute respiratory failure with hypoxia/Covid pneumonia/suspected bacterial superinfection COVID-19 Labs  Recent Labs    08/01/20 0237 08/02/20 0354 08/03/20 0254 08/03/20 1108  DDIMER 1.58* 1.87*  --  1.85*  FERRITIN 2,066* 2,349*  --   --   LDH 309* 348* 330*  --   CRP 8.1* 6.9*  --   --     Lab Results  Component Value Date   SARSCOV2NAA POSITIVE (A) 07/25/2020   -Patient with CrCl<46ml/min therefore and eligible for Remdesivir or Baricitinib -In addition possible bacterial superinfection initial procalcitonin=Results for AHNESTY, FINFROCK (MRN [id number]) as of 07/28/2020 19:45  Ref. Range 07/25/2020 08:40   Procalcitonin Latest Units: ng/mL 2.35  -Trend procalcitonin -Cultures NGTD -12/22 spoke with pharmacy all antibiotics have been completed.   -Solu-Medrol 40 mg BID -Vitamins per Covid protocol -DuoNeb QID -Flutter valve -Incentive spirometry  Sepsis pneumonia (Covid 19/suspected CAP) -On admission criteria for sepsis tachycardia, tachypnea, leukocytosis -Cultures obtained, placed on antibiotics.  All physiologic signs of sepsis have resolved.  Elevated D-dimer -Unsure if elevation secondary to worsening disease process for secondary to patient's fluid contraction.  Patient just had HD last night.  Currently no increase in O2 demand. -If D-dimer continues to increase will obtain CTA chest after consulting with nephrologist (patient already on HD if they do not believe patient's kidneys recoverable will proceed).  Otherwise may just treat empirically because currently patient would not be able to cooperate with VQ scan -D-dimer decreased on 12/25 now started back up will hold up 1 more day to see if we have a trend before making decision -12/27 D-dimer slightly down today believe patient's elevated D-dimer partially due to her renal failure.  Acute on CKD stage IV (baseline Cr 2.4-2.7) -On presentation Cr 6.8 not improving -Transferred to [facility] per nephrology. -Does not currently fluid appear overloaded.  Anion gap metabolic acidosis -Worsening anion gap metabolic acidosis -96/28 1 amp sodium bicarb -12/22 nontunneled HD cath placed  Chronic diastolic CHF -BNP on admission= 259 -Strict in and out +5.8 L -Daily weight Filed Weights   08/01/20 0709 08/02/20 1040 08/02/20 1416  Weight: 68.8 kg 68.5 kg 68.1 kg  -12/27 discontinue Amiodarone drip -Amlodipine 10 mg daily -  12/27 Amiodarone 200 mg daily -Clonidine 0.1 mg TID -12/27 Hydralazine IV PRN -12/22 Metoprolol 12.5 mg BID Hold for MAP<60 -  A. fib with RVR -CHADS2Vasc score is 7 (HTN, age x 2, DM, TIA x2, gender), Can  convert to [place] later in admission -Cardiology consulted -12/22 currently sinus arrhythmia currently NSR -Continue heparin drip -Last seen by cardiology on 12/18 per their note would consider outpatient monitor after systemic illness have resolved. -Cardiology recommends echocardiogram once rate better controlled. -12/22 Echocardiogram; diastolic dysfunction, pulmonary HTN see echo below  Essential HTN -See CHF  Pulmonary HTN -See CHF  Acute metabolic encephalopathy -11/94 resolved answer questions appropriately follows all commands  Hx TIA -Hold aspirin in the setting of heparin infusion -Lipitor 20 mg daily -12/22 LDL = 57: Goal LDL <70   DM type II uncontrolled with Hyperglycemia -12/22 Hemoglobin A1c= 7.7  -12/22 Levemir 7 units BID -12/27 increase NovoLog 6units qac -12/27 decrease sensitive SSI  -Tradjenta 5 mg daily  Mixed Anemia -Anemia panel most consistent with a mixed anemia -Occult blood pending      DVT prophylaxis: Heparin drip Code Status: Full Family Communication: 12/26 spoke with [facility] discussed plan of care answered all questions Status is: Inpatient    Dispo: The patient is from:               Anticipated d/c is to: Home              Anticipated d/c date is: 12/30              Patient currently unstable       Consultants:  Nephrology Cardiology  Procedures/Significant Events:  12/22 nontunneled HD catheter terminating within the superior aspect RIGHT atrium 12/23 echocardiogram limited;LVEF= 55 to 60%.  -Grade I diastolic dysfunction  Right Ventricle: Mldly elevated pulmonary artery systolic pressure. estimated  right ventricular systolic pressure is 17.4 mmHg.    I have personally reviewed and interpreted all radiology studies and my findings are as above.  VENTILATOR SETTINGS: HFNC 12/27 Flow; 3 L/min SPO2; 91%   Cultures 12/18 blood RIGHT AC negative 12/18 MRSA by PCR negative 12/18 blood right hand negative final  x2    Antimicrobials: Anti-infectives (From admission, onward)   Start     Ordered Stop   07/26/20 1000  cefTRIAXone (ROCEPHIN) 1 g in sodium chloride 0.9 % 100 mL IVPB        07/25/20 1138 07/30/20 0959   07/26/20 1000  doxycycline (VIBRAMYCIN) 100 mg in sodium chloride 0.9 % 250 mL IVPB        07/25/20 1138 07/27/20 2335   07/25/20 0500  levofloxacin (LEVAQUIN) IVPB 750 mg        07/25/20 0453 07/25/20 0824         Devices    LINES / TUBES:      Continuous Infusions: . amiodarone 30 mg/hr (08/03/20 0400)  . diltiazem (CARDIZEM) infusion Stopped (07/27/20 0233)  . heparin 800 Units/hr (08/03/20 0400)     Objective: Vitals:   08/03/20 0200 08/03/20 0423 08/03/20 0440 08/03/20 0745  BP:  (!) 163/77 (!) 147/83 (!) 173/76  Pulse: 96 (!) 104 86 92  Resp: 19 18 20 20   Temp:  98.1 F (36.7 C)  98.8 F (37.1 C)  TempSrc:  Oral  Axillary  SpO2: 92% (!) 89% 91% 95%  Weight:      Height:        Intake/Output Summary (Last 24 hours) at 08/03/2020 0930 Last data filed  at 08/03/2020 0500 Gross per 24 hour  Intake 854.45 ml  Output 702 ml  Net 152.45 ml   Filed Weights   08/01/20 0709 08/02/20 1040 08/02/20 1416  Weight: 68.8 kg 68.5 kg 68.1 kg   Physical Exam:  General: A/O x4, positive acute respiratory distress Eyes: negative scleral hemorrhage, negative anisocoria, negative icterus ENT: Negative Runny nose, negative gingival bleeding, Neck:  Negative scars, masses, torticollis, lymphadenopathy, JVD Lungs: decreased breath sounds bilaterally without wheezes or crackles Cardiovascular: Regular rate and rhythm without murmur gallop or rub normal S1 and S2 Abdomen: negative abdominal pain, nondistended, positive soft, bowel sounds, no rebound, no ascites, no appreciable mass Extremities: No significant cyanosis, clubbing, or edema bilateral lower extremities Skin: Negative rashes, lesions, ulcers Psychiatric:  Negative depression, negative anxiety, negative  fatigue, negative mania  Central nervous system:  Cranial nerves II through XII intact, tongue/uvula midline, all extremities muscle strength 5/5, sensation intact throughout, negative dysarthria, negative expressive aphasia, negative receptive aphasia.    Data Reviewed: Care during the described time interval was provided by me .  I have reviewed this patient's available data, including medical history, events of note, physical examination, and all test results as part of my evaluation.  CBC: Recent Labs  Lab 07/29/20 0147 07/30/20 0500 07/31/20 0050 08/01/20 0237 08/02/20 0354  WBC 11.9* 23.1* 14.3* 16.8* 25.7*  NEUTROABS 11.2* 20.4* 12.9* 15.6* 23.9*  HGB 7.7* 8.5* 8.8* 7.0* 8.1*  HCT 23.2* 26.8* 27.1* 21.3* 25.7*  MCV 67.1* 67.3* 66.6* 67.8* 69.3*  PLT 393 425* 353 293 233   Basic Metabolic Panel: Recent Labs  Lab 07/29/20 0147 07/30/20 0500 07/31/20 0050 08/01/20 0237 08/02/20 0354 08/03/20 0254  NA 137 138 140 138 138  --   K 4.0 3.5 3.7 4.3 4.0  --   CL 104 99 101 100 97*  --   CO2 15* 19* 22 26 25   --   GLUCOSE 233* 99 130* 147* 123*  --   BUN 137* 85* 96* 42* 59*  --   CREATININE 8.56* 6.18* 6.72* 3.98* 5.21*  --   CALCIUM 8.8* 8.8* 8.9 8.0* 9.0  --   MG 2.1 1.7 1.9 1.6* 1.9 1.9  PHOS 6.3* 4.6 6.4* 3.9 5.3* 4.0   GFR: Estimated Creatinine Clearance: 7.3 mL/min (A) (by C-G formula based on SCr of 5.21 mg/dL (H)). Liver Function Tests: Recent Labs  Lab 07/29/20 0147 07/30/20 0500 07/31/20 0050 08/01/20 0237 08/02/20 0354  AST 21 32 25 21 19   ALT 17 19 18 14 18   ALKPHOS 56 61 66 49 72  BILITOT 0.8 0.8 0.9 0.6 0.8  PROT 6.6 7.3 7.0 5.3* 6.6  ALBUMIN 2.5* 2.8* 2.7* 2.1* 2.6*   No results for input(s): LIPASE, AMYLASE in the last 168 hours. No results for input(s): AMMONIA in the last 168 hours. Coagulation Profile: No results for input(s): INR, PROTIME in the last 168 hours. Cardiac Enzymes: No results for input(s): CKTOTAL, CKMB, CKMBINDEX,  TROPONINI in the last 168 hours. BNP (last 3 results) No results for input(s): PROBNP in the last 8760 hours. HbA1C: No results for input(s): HGBA1C in the last 72 hours. CBG: Recent Labs  Lab 08/02/20 2116 08/02/20 2135 08/02/20 2249 08/02/20 2347 08/03/20 0753  GLUCAP 64* 74 113* 130* 296*   Lipid Profile: No results for input(s): CHOL, HDL, LDLCALC, TRIG, CHOLHDL, LDLDIRECT in the last 72 hours. Thyroid Function Tests: No results for input(s): TSH, T4TOTAL, FREET4, T3FREE, THYROIDAB in the last 72 hours. Anemia Panel: Recent  Labs    08/01/20 0237 08/02/20 0354  FERRITIN 2,066* 2,349*   Sepsis Labs: Recent Labs  Lab 07/28/20 2043 07/29/20 0147 07/30/20 0500  PROCALCITON 0.96 0.84 0.52    Recent Results (from the past 240 hour(s))  Resp Panel by RT-PCR (Flu A&B, Covid) Nasopharyngeal Swab     Status: Abnormal   Collection Time: 07/25/20  1:56 AM   Specimen: Nasopharyngeal Swab; Nasopharyngeal(NP) swabs in vial transport medium  Result Value Ref Range Status   SARS Coronavirus 2 by RT PCR POSITIVE (A) NEGATIVE Final    Comment: CRITICAL RESULT CALLED TO, READ BACK BY AND VERIFIED WITH: Burnis Medin RN 07/25/20 @0603  BY P.HENDERSON (NOTE) SARS-CoV-2 target nucleic acids are DETECTED.  The SARS-CoV-2 RNA is generally detectable in upper respiratory specimens during the acute phase of infection. Positive results are indicative of the presence of the identified virus, but do not rule out bacterial infection or co-infection with other pathogens not detected by the test. Clinical correlation with patient history and other diagnostic information is necessary to determine patient infection status. The expected result is Negative.  Fact Sheet for Patients: EntrepreneurPulse.com.au  Fact Sheet for Healthcare Providers: IncredibleEmployment.be  This test is not yet approved or cleared by the [place] FDA and  has been  authorized for detection and/or diagnosis of SARS-CoV-2 by FDA under an Emergency Use Authorization (EUA).  This EUA will remain in effect (me aning this test can be used) for the duration of  the COVID-19 declaration under Section 564(b)(1) of the Act, 21 U.S.C. section 360bbb-3(b)(1), unless the authorization is terminated or revoked sooner.     Influenza A by PCR NEGATIVE NEGATIVE Final   Influenza B by PCR NEGATIVE NEGATIVE Final    Comment: (NOTE) The Xpert Xpress SARS-CoV-2/FLU/RSV plus assay is intended as an aid in the diagnosis of influenza from Nasopharyngeal swab specimens and should not be used as a sole basis for treatment. Nasal washings and aspirates are unacceptable for Xpert Xpress SARS-CoV-2/FLU/RSV testing.  Fact Sheet for Patients: EntrepreneurPulse.com.au  Fact Sheet for Healthcare Providers: IncredibleEmployment.be  This test is not yet approved or cleared by the [place] FDA and has been authorized for detection and/or diagnosis of SARS-CoV-2 by FDA under an Emergency Use Authorization (EUA). This EUA will remain in effect (meaning this test can be used) for the duration of the COVID-19 declaration under Section 564(b)(1) of the Act, 21 U.S.C. section 360bbb-3(b)(1), unless the authorization is terminated or revoked.  Performed at [facility], [place] [address] [place], [place] [address]   Culture, blood (single)     Status: None   Collection Time: 07/25/20  6:19 AM   Specimen: BLOOD  Result Value Ref Range Status   Specimen Description   Final    BLOOD RIGHT ANTECUBITAL Performed at [facility] [address], [place], [place] [address]    Special Requests   Final    BOTTLES DRAWN AEROBIC AND ANAEROBIC Blood Culture adequate volume Performed at [place] [address], [place], [place] [address]    Culture   Final    NO GROWTH 5 DAYS Performed  at [facility], [place] [address] [place],  [address]    Report Status 07/30/2020 FINAL  Final  MRSA PCR Screening     Status: None   Collection Time: 07/25/20 11:58 AM   Specimen: Nasopharyngeal  Result Value Ref Range Status   MRSA by PCR NEGATIVE NEGATIVE Final  Comment:        The GeneXpert MRSA Assay (FDA approved for NASAL specimens only), is one component of a comprehensive MRSA colonization surveillance program. It is not intended to diagnose MRSA infection nor to guide or monitor treatment for MRSA infections. Performed at [facility], [place] [address] [place], [place] [address]   Blood Culture (routine x 2)     Status: None   Collection Time: 07/25/20  4:18 PM   Specimen: BLOOD  Result Value Ref Range Status   Specimen Description   Final    BLOOD RIGHT HAND Performed at [facility] [address], [place], [place] [address]    Special Requests   Final    BOTTLES DRAWN AEROBIC ONLY Blood Culture results may not be optimal due to an inadequate volume of blood received in culture bottles Performed at [place] [address] [place], [place] [address]    Culture   Final    NO GROWTH 5 DAYS Performed at [facility], [place] [address], [place], [place] [address]    Report Status 07/30/2020 FINAL  Final  Blood Culture (routine x 2)     Status: None   Collection Time: 07/25/20  4:18 PM   Specimen: BLOOD  Result Value Ref Range Status   Specimen Description   Final    BLOOD RIGHT HAND Performed at [place] [address] [place], [place] [address]    Special Requests   Final    BOTTLES DRAWN AEROBIC ONLY Blood Culture adequate volume Performed at [place] [facility] [place] [address], [place], [place] [address]    Culture   Final    NO GROWTH 5 DAYS Performed at [facility], [place] [address], [place],  [address]    Report Status  07/30/2020 FINAL  Final         Radiology Studies: No results found.      Scheduled Meds: . amiodarone  150 mg Intravenous Once  . amLODipine  10 mg Oral Daily  . vitamin C  500 mg Oral Daily  . atorvastatin  20 mg Oral q1800  . chlorhexidine  15 mL Mouth Rinse BID  . Chlorhexidine Gluconate Cloth  6 each Topical Q0600  . cloNIDine  0.1 mg Oral TID  . [START ON 08/07/2020] darbepoetin (ARANESP) injection - DIALYSIS  150 mcg Intravenous Q Fri-HD  . haloperidol lactate  2 mg Intravenous Once  . insulin aspart  0-5 Units Subcutaneous QHS  . insulin aspart  0-9 Units Subcutaneous TID WC  . insulin aspart  3 Units Subcutaneous TID WC  . insulin detemir  7 Units Subcutaneous BID  . linagliptin  5 mg Oral Daily  . mouth rinse  15 mL Mouth Rinse BID  . mouth rinse  15 mL Mouth Rinse q12n4p  . methylPREDNISolone (SOLU-MEDROL) injection  40 mg Intravenous Q12H  . metoprolol tartrate  12.5 mg Oral BID  . sodium bicarbonate  650 mg Oral TID  . sodium chloride flush  10-40 mL Intracatheter Q12H  . sodium chloride flush  3 mL Intravenous Q12H  . zinc sulfate  220 mg Oral Daily   Continuous Infusions: . amiodarone 30 mg/hr (08/03/20 0400)  . diltiazem (CARDIZEM) infusion Stopped (07/27/20 0233)  . heparin 800 Units/hr (08/03/20 0400)     LOS: 9 days    Time spent:40 min    Aubrea Meixner, Geraldo Docker, MD [facility] Pager [phone]  If  7PM-7AM, please contact night-coverage [email or web address] Password [email or web address] 08/03/2020, 9:30 AM

## 2020-08-03 NOTE — Progress Notes (Signed)
While rounding on patient, RN noticed despite having mittens on, patient removed IV line. RN received report that patient was confused and was removing lines and iv dressings, which is why mittens were placed. RN notified DO of patient events and new orders were placed. CN notified as well.

## 2020-08-04 ENCOUNTER — Inpatient Hospital Stay (HOSPITAL_COMMUNITY): Payer: Medicare HMO

## 2020-08-04 DIAGNOSIS — U071 COVID-19: Secondary | ICD-10-CM | POA: Diagnosis not present

## 2020-08-04 DIAGNOSIS — N179 Acute kidney failure, unspecified: Secondary | ICD-10-CM | POA: Diagnosis not present

## 2020-08-04 DIAGNOSIS — I4891 Unspecified atrial fibrillation: Secondary | ICD-10-CM | POA: Diagnosis not present

## 2020-08-04 DIAGNOSIS — J9601 Acute respiratory failure with hypoxia: Secondary | ICD-10-CM | POA: Diagnosis not present

## 2020-08-04 HISTORY — PX: IR FLUORO GUIDE CV LINE RIGHT: IMG2283

## 2020-08-04 LAB — CBC WITH DIFFERENTIAL/PLATELET
Abs Immature Granulocytes: 0.49 10*3/uL — ABNORMAL HIGH (ref 0.00–0.07)
Basophils Absolute: 0 10*3/uL (ref 0.0–0.1)
Basophils Relative: 0 %
Eosinophils Absolute: 0 10*3/uL (ref 0.0–0.5)
Eosinophils Relative: 0 %
HCT: 24.9 % — ABNORMAL LOW (ref 36.0–46.0)
Hemoglobin: 8.1 g/dL — ABNORMAL LOW (ref 12.0–15.0)
Immature Granulocytes: 2 %
Lymphocytes Relative: 2 %
Lymphs Abs: 0.4 10*3/uL — ABNORMAL LOW (ref 0.7–4.0)
MCH: 22.7 pg — ABNORMAL LOW (ref 26.0–34.0)
MCHC: 32.5 g/dL (ref 30.0–36.0)
MCV: 69.7 fL — ABNORMAL LOW (ref 80.0–100.0)
Monocytes Absolute: 0.4 10*3/uL (ref 0.1–1.0)
Monocytes Relative: 2 %
Neutro Abs: 20.6 10*3/uL — ABNORMAL HIGH (ref 1.7–7.7)
Neutrophils Relative %: 94 %
Platelets: 285 10*3/uL (ref 150–400)
RBC: 3.57 MIL/uL — ABNORMAL LOW (ref 3.87–5.11)
RDW: 15.9 % — ABNORMAL HIGH (ref 11.5–15.5)
WBC: 21.9 10*3/uL — ABNORMAL HIGH (ref 4.0–10.5)
nRBC: 2.4 % — ABNORMAL HIGH (ref 0.0–0.2)

## 2020-08-04 LAB — COMPREHENSIVE METABOLIC PANEL
ALT: 18 U/L (ref 0–44)
AST: 19 U/L (ref 15–41)
Albumin: 2.6 g/dL — ABNORMAL LOW (ref 3.5–5.0)
Alkaline Phosphatase: 75 U/L (ref 38–126)
Anion gap: 14 (ref 5–15)
BUN: 44 mg/dL — ABNORMAL HIGH (ref 8–23)
CO2: 26 mmol/L (ref 22–32)
Calcium: 8.8 mg/dL — ABNORMAL LOW (ref 8.9–10.3)
Chloride: 99 mmol/L (ref 98–111)
Creatinine, Ser: 4.84 mg/dL — ABNORMAL HIGH (ref 0.44–1.00)
GFR, Estimated: 8 mL/min — ABNORMAL LOW (ref >60)
Glucose, Bld: 91 mg/dL (ref 70–99)
Potassium: 4.1 mmol/L (ref 3.5–5.1)
Sodium: 139 mmol/L (ref 135–145)
Total Bilirubin: 0.6 mg/dL (ref 0.3–1.2)
Total Protein: 6.4 g/dL — ABNORMAL LOW (ref 6.5–8.1)

## 2020-08-04 LAB — GLUCOSE, CAPILLARY
Glucose-Capillary: 120 mg/dL — ABNORMAL HIGH (ref 70–99)
Glucose-Capillary: 122 mg/dL — ABNORMAL HIGH (ref 70–99)
Glucose-Capillary: 94 mg/dL (ref 70–99)
Glucose-Capillary: 151 mg/dL — ABNORMAL HIGH (ref 70–99)
Glucose-Capillary: 157 mg/dL — ABNORMAL HIGH (ref 70–99)

## 2020-08-04 LAB — LACTATE DEHYDROGENASE: LDH: 360 U/L — ABNORMAL HIGH (ref 98–192)

## 2020-08-04 LAB — HEPARIN LEVEL (UNFRACTIONATED)
Heparin Unfractionated: 0.13 IU/mL — ABNORMAL LOW (ref 0.30–0.70)
Heparin Unfractionated: 0.72 IU/mL — ABNORMAL HIGH (ref 0.30–0.70)

## 2020-08-04 LAB — FERRITIN: Ferritin: 2274 ng/mL — ABNORMAL HIGH (ref 11–307)

## 2020-08-04 LAB — C-REACTIVE PROTEIN: CRP: 3.4 mg/dL — ABNORMAL HIGH (ref <1.0)

## 2020-08-04 LAB — MAGNESIUM: Magnesium: 2.1 mg/dL (ref 1.7–2.4)

## 2020-08-04 LAB — D-DIMER, QUANTITATIVE: D-Dimer, Quant: 2.11 ug/mL-FEU — ABNORMAL HIGH (ref 0.00–0.50)

## 2020-08-04 LAB — PHOSPHORUS: Phosphorus: 4.4 mg/dL (ref 2.5–4.6)

## 2020-08-04 MED ORDER — MIDAZOLAM HCL 2 MG/2ML IJ SOLN
INTRAMUSCULAR | Status: AC
  Filled 2020-08-04: qty 2

## 2020-08-04 MED ORDER — ALTEPLASE 2 MG IJ SOLR: 2.0000 mg | Freq: Once | INTRAMUSCULAR | Status: DC | PRN

## 2020-08-04 MED ORDER — VANCOMYCIN HCL IN DEXTROSE 1-5 GM/200ML-% IV SOLN
INTRAVENOUS | Status: AC
  Filled 2020-08-04: qty 200

## 2020-08-04 MED ORDER — SODIUM CHLORIDE 0.9 % IV SOLN
100.0000 mL | INTRAVENOUS | Status: DC | PRN
Start: 2020-08-04 — End: 2020-08-04

## 2020-08-04 MED ORDER — HEPARIN SODIUM (PORCINE) 1000 UNIT/ML IJ SOLN
INTRAMUSCULAR | Status: AC | PRN
Start: 2020-08-04 — End: 2020-08-04
  Administered 2020-08-04: 3.2 mL via INTRAVENOUS

## 2020-08-04 MED ORDER — LIDOCAINE HCL 1 % IJ SOLN
INTRAMUSCULAR | Status: AC | PRN
Start: 2020-08-04 — End: 2020-08-04
  Administered 2020-08-04: 5 mL

## 2020-08-04 MED ORDER — VANCOMYCIN HCL IN DEXTROSE 1-5 GM/200ML-% IV SOLN
1000.0000 mg | INTRAVENOUS | Status: AC
  Administered 2020-08-04: 1000 mg via INTRAVENOUS

## 2020-08-04 MED ORDER — HEPARIN SODIUM (PORCINE) 1000 UNIT/ML IJ SOLN
INTRAMUSCULAR | Status: AC
  Administered 2020-08-04: 2600 [IU] via INTRAVENOUS_CENTRAL
  Filled 2020-08-04: qty 3

## 2020-08-04 MED ORDER — HEPARIN SODIUM (PORCINE) 1000 UNIT/ML DIALYSIS: 20.0000 [IU]/kg | INTRAMUSCULAR | Status: DC | PRN

## 2020-08-04 MED ORDER — PENTAFLUOROPROP-TETRAFLUOROETH EX AERO: 1.0000 "application " | INHALATION_SPRAY | CUTANEOUS | Status: DC | PRN

## 2020-08-04 MED ORDER — LIDOCAINE-PRILOCAINE 2.5-2.5 % EX CREA
1.0000 | TOPICAL_CREAM | CUTANEOUS | Status: DC | PRN
Start: 2020-08-04 — End: 2020-08-04

## 2020-08-04 MED ORDER — CHLORHEXIDINE GLUCONATE 4 % EX LIQD
CUTANEOUS | Status: AC
  Filled 2020-08-04: qty 15

## 2020-08-04 MED ORDER — LIDOCAINE HCL (PF) 1 % IJ SOLN: 5.0000 mL | INTRAMUSCULAR | Status: DC | PRN

## 2020-08-04 MED ORDER — HEPARIN SODIUM (PORCINE) 1000 UNIT/ML IJ SOLN
INTRAMUSCULAR | Status: AC
  Filled 2020-08-04: qty 1

## 2020-08-04 MED ORDER — HEPARIN (PORCINE) 25000 UT/250ML-% IV SOLN
850.0000 [IU]/h | INTRAVENOUS | Status: DC
  Filled 2020-08-04: qty 250

## 2020-08-04 MED ORDER — FENTANYL CITRATE (PF) 100 MCG/2ML IJ SOLN
INTRAMUSCULAR | Status: AC
  Filled 2020-08-04: qty 2

## 2020-08-04 MED ORDER — LIDOCAINE HCL 1 % IJ SOLN
INTRAMUSCULAR | Status: AC
  Filled 2020-08-04: qty 20

## 2020-08-04 MED ORDER — HEPARIN SODIUM (PORCINE) 1000 UNIT/ML DIALYSIS: 1000.0000 [IU] | INTRAMUSCULAR | Status: DC | PRN

## 2020-08-04 MED ORDER — MIDAZOLAM HCL 2 MG/2ML IJ SOLN
INTRAMUSCULAR | Status: AC | PRN
Start: 2020-08-04 — End: 2020-08-04
  Administered 2020-08-04: 0.5 mg via INTRAVENOUS

## 2020-08-04 MED ORDER — FENTANYL CITRATE (PF) 100 MCG/2ML IJ SOLN
INTRAMUSCULAR | Status: AC | PRN
  Administered 2020-08-04: 25 ug via INTRAVENOUS

## 2020-08-04 NOTE — Consult Note (Signed)
Chief Complaint: Patient was seen in consultation today for  Chief Complaint  Patient presents with  . Fatigue    Referring Physician(s): Dr. Carolin Sicks  Supervising Physician: Jacqulynn Cadet  Patient Status: [facility] - In-pt  History of Present Illness: [name] [name] is a 84 y.o. female with a medical history significant for rheumatoid arthritis, CKD stage IV, DM, HTN, OSA (CPAP), TIA, and heart failure. She presented to the ED 07/25/20 with progressively worsening cough, lethargy and confusion. Work up was positive for COVID-19 infection, atrial fibrillation with RVR and elevated BUN/Creatinine levels. She was seen in IR 07/29/20 for the placement of a right IJ temporary dialysis catheter and has received several hemodialysis treatments since that time. Her team is now preparing her for hospital discharge and outpatient dialysis.    Interventional Radiology has been asked to evaluate this patient for the conversion of her temporary dialysis catheter to a tunneled dialysis catheter for ongoing hemodialysis therapy.   Past Medical History:  Diagnosis Date  . Arthritis    RA  . CHF (congestive heart failure) ([place])   . CKD (chronic kidney disease) stage 3, GFR 30-59 ml/min (HCC)   . Diabetes mellitus without complication (HCC)    insulin dependent  . Hyperlipidemia   . Hypertension   . Shortness of breath dyspnea   . Sleep apnea    cpap  . TIA (transient ischemic attack) 2015    Past Surgical History:  Procedure Laterality Date  . ABDOMINAL HYSTERECTOMY  1974  . IR FLUORO GUIDE CV LINE RIGHT  07/29/2020  . IR US GUIDE VASC ACCESS RIGHT  07/29/2020    Allergies: Penicillins  Medications: Prior to Admission medications   Medication Sig Start Date End Date Taking? Authorizing Provider  amLODipine (NORVASC) 10 MG tablet Take 1 tablet (10 mg total) by mouth daily. 07/13/20  Yes [name], MD  aspirin 325 MG tablet Take 325 mg by mouth daily. For stroke  prevention with history of TIA   Yes [provider]  atorvastatin (LIPITOR) 20 MG tablet Take 1 tablet (20 mg total) by mouth daily at 6 PM. For cholesterol 12/04/19  Yes [name], MD  calcitRIOL (ROCALTROL) 0.25 MCG capsule Take 0.25 mcg by mouth daily. 07/06/20  Yes [provider]  cetirizine (ZYRTEC) 10 MG tablet Take 10 mg by mouth daily.   Yes [provider]  cloNIDine (CATAPRES) 0.1 MG tablet TAKE 1 TABLET(0.1 MG) BY MOUTH THREE TIMES DAILY Patient taking differently: Take 0.1 mg by mouth 3 (three) times daily. 07/13/20  Yes [name], MD  furosemide (LASIX) 80 MG tablet TAKE 2 TABLETS BY MOUTH TWICE DAILY Patient taking differently: Take 160 mg by mouth 2 (two) times daily. 05/23/20  Yes [name], MD  hydrALAZINE (APRESOLINE) 25 MG tablet TAKE 1 TABLET BY MOUTH THREE TIMES DAILY WITH FOOD Patient taking differently: Take 25 mg by mouth with breakfast, with lunch, and with evening meal. 07/13/20  Yes [name], MD  insulin detemir (LEVEMIR) 100 UNIT/ML FlexPen Inject 30 units with breakfast and 15 units before bedtime. Patient taking differently: Inject 15-30 Units into the skin See admin instructions. Inject 30 units subcutaneously with breakfast as needed for high blood sugar and 15 units subcutaneously before bedtime as needed for high blood sugar. 12/04/19  Yes [name], MD  blood glucose meter kit and supplies KIT Dispense based on patient and insurance preference. Use up to four times daily as directed. (FOR ICD-9 250.00, 250.01). 04/01/17   [place],  Courteney Lyn, MD  glucose blood test strip Use as directed to check blood sugars 3 times per day dx: e11.65 06/09/20   [name], MD  mometasone (ELOCON) 0.1 % cream Apply 1 application topically daily. Patient not taking: No sig reported 01/30/20   Minette Brine, FNP     Family History  Problem Relation Age of Onset  . Diabetes type II Mother   . Hypertension Mother   . Diabetes  type II Father   . Hypertension Other   . Hyperlipidemia Other   . Stroke Other     Social History   Socioeconomic History  . Marital status: Married    Spouse name: Not on file  . Number of children: 6  . Years of education: 12th  . Highest education level: Not on file  Occupational History  . Occupation: retired    Fish farm manager: RETIRED  Tobacco Use  . Smoking status: Never Smoker  . Smokeless tobacco: Never Used  Vaping Use  . Vaping Use: Never used  Substance and Sexual Activity  . Alcohol use: No    Alcohol/week: 0.0 standard drinks  . Drug use: No  . Sexual activity: Not Currently  Other Topics Concern  . Not on file  Social History Narrative   Patient lives with her husband    Patient is right handed   Patient drinks tea   Social Determinants of Health   Financial Resource Strain: Low Risk   . Difficulty of Paying Living Expenses: Not hard at all  Food Insecurity: No Food Insecurity  . Worried About Charity fundraiser in the Last Year: Never true  . Ran Out of Food in the Last Year: Never true  Transportation Needs: No Transportation Needs  . Lack of Transportation (Medical): No  . Lack of Transportation (Non-Medical): No  Physical Activity: Inactive  . Days of Exercise per Week: 0 days  . Minutes of Exercise per Session: 0 min  Stress: No Stress Concern Present  . Feeling of Stress : Not at all  Social Connections: Not on file    Review of Systems: A 12 point ROS discussed and pertinent positives are indicated in the HPI above.  All other systems are negative.  Review of Systems  Unable to perform ROS: Mental status change    Vital Signs: BP 140/72   Pulse 95   Temp 98 F (36.7 C) (Oral)   Resp 20   Ht _0  (1.549 m)   Wt 152 lb 8.9 oz (69.2 kg)   SpO2 97%   BMI 28.83 kg/m   Physical Exam Constitutional:      General: She is not in acute distress. HENT:     Mouth/Throat:     Mouth: Mucous membranes are moist.     Pharynx: Oropharynx is  clear.  Cardiovascular:     Rate and Rhythm: Normal rate. Rhythm irregular.     Pulses: Normal pulses.     Heart sounds: Normal heart sounds.     Comments: Right IJ temporary dialysis catheter. Dressing is clean and dry; site unremarkable.  Pulmonary:     Effort: Pulmonary effort is normal.     Breath sounds: Normal breath sounds.  Abdominal:     General: Bowel sounds are normal.     Palpations: Abdomen is soft.  Musculoskeletal:     Right lower leg: Edema present.     Left lower leg: Edema present.  Neurological:     Mental Status: She is alert. She is  disoriented.     Comments: Safety mitts in place.      Imaging: DG Chest 2 View  Result Date: 07/25/2020 CLINICAL DATA:  COVID-19 EXAM: CHEST - 2 VIEW COMPARISON:  05/25/2015 FINDINGS: Multifocal peripheral predominant airspace opacity. No pleural effusion or pneumothorax. IMPRESSION: Multifocal peripheral predominant airspace opacity, compatible with multifocal pneumonia. Electronically Signed   By: Ulyses Jarred M.D.   On: 07/25/2020 02:48   US RENAL  Result Date: 07/25/2020 CLINICAL DATA:  Acute renal failure EXAM: RENAL / URINARY TRACT ULTRASOUND COMPLETE COMPARISON:  None. FINDINGS: Right Kidney: Renal measurements: 9.8 x 4.8 x 5.1 cm = volume: 126 mL. Diffusely increased renal cortical echogenicity. Mild renal pelviectasis. No calyceal dilatation to suggest frank hydronephrosis. No concerning renal mass or shadowing calculus is visible Left Kidney: Renal measurements: 10.4 x 5.5 x 5.4 cm = volume: 161 mL. Diffusely increased renal cortical echogenicity. Tiny echogenic, shadowing focus in the interpolar left kidney measuring up to 6 mm in size, could reflect a tiny nonobstructive calculus or Randall's plaque. No hydronephrosis or concerning mass. Bladder: Within normal limits for the degree of distention. Other: None. IMPRESSION: 1. Diffusely increased renal cortical echogenicity bilaterally compatible with medical renal disease. 2.  Mild right renal pelviectasis without calyceal dilatation to suggest frank hydronephrosis. Could reflect physiologic distension or an extrarenal pelvis. 3. 6 mm echogenic, shadowing focus in the interpolar left kidney may reflect a tiny nonobstructive calculus or Randall's plaque. Electronically Signed   By: Lovena Le M.D.   On: 07/25/2020 20:23   IR Fluoro Guide CV Line Right  Result Date: 07/29/2020 INDICATION: History of COVID now with acute renal insufficiency. Please perform placement of a non tunneled hemodialysis catheter for the initiation of dialysis. EXAM: NON-TUNNELED CENTRAL VENOUS HEMODIALYSIS CATHETER PLACEMENT WITH ULTRASOUND AND FLUOROSCOPIC GUIDANCE COMPARISON:  None. MEDICATIONS: None, the patient is currently admitted to the hospital receiving intravenous antibiotics; The antibiotic was administered within an appropriate time interval prior to skin puncture. FLUOROSCOPY TIME:  30 seconds COMPLICATIONS: None immediate. PROCEDURE: Informed written consent was obtained from the patient after a discussion of the risks, benefits, and alternatives to treatment. Questions regarding the procedure were encouraged and answered. The right neck and chest were prepped with chlorhexidine in a sterile fashion, and a sterile drape was applied covering the operative field. Maximum barrier sterile technique with sterile gowns and gloves were used for the procedure. A timeout was performed prior to the initiation of the procedure. After the overlying soft tissues were anesthetized, a small venotomy incision was created and a micropuncture kit was utilized to access the internal jugular vein. Real-time ultrasound guidance was utilized for vascular access including the acquisition of a permanent ultrasound image documenting patency of the accessed vessel. The microwire was utilized to measure appropriate catheter length. A stiff glidewire was advanced to the level of the IVC. Under fluoroscopic guidance, the  venotomy was serially dilated, ultimately allowing placement of a 16 cm temporary Mahurkar catheter with tip ultimately terminating within the superior aspect of the right atrium. Final catheter positioning was confirmed and documented with a spot radiographic image. The catheter aspirates and flushes normally. The catheter was flushed with appropriate volume heparin dwells. The catheter exit site was secured with a 0-Prolene retention suture. A dressing was applied. The patient tolerated the procedure well without immediate post procedural complication. IMPRESSION: Successful placement of a right internal jugular approach 16 cm temporary dialysis catheter with tip terminating with in the superior aspect of the right atrium. The  catheter is ready for immediate use. PLAN: This catheter may be converted to a tunneled dialysis catheter at a later date as indicated. Electronically Signed   By: Sandi Mariscal M.D.   On: 07/29/2020 14:57   IR US Guide Vasc Access Right  Result Date: 07/29/2020 INDICATION: History of COVID now with acute renal insufficiency. Please perform placement of a non tunneled hemodialysis catheter for the initiation of dialysis. EXAM: NON-TUNNELED CENTRAL VENOUS HEMODIALYSIS CATHETER PLACEMENT WITH ULTRASOUND AND FLUOROSCOPIC GUIDANCE COMPARISON:  None. MEDICATIONS: None, the patient is currently admitted to the hospital receiving intravenous antibiotics; The antibiotic was administered within an appropriate time interval prior to skin puncture. FLUOROSCOPY TIME:  30 seconds COMPLICATIONS: None immediate. PROCEDURE: Informed written consent was obtained from the patient after a discussion of the risks, benefits, and alternatives to treatment. Questions regarding the procedure were encouraged and answered. The right neck and chest were prepped with chlorhexidine in a sterile fashion, and a sterile drape was applied covering the operative field. Maximum barrier sterile technique with sterile gowns  and gloves were used for the procedure. A timeout was performed prior to the initiation of the procedure. After the overlying soft tissues were anesthetized, a small venotomy incision was created and a micropuncture kit was utilized to access the internal jugular vein. Real-time ultrasound guidance was utilized for vascular access including the acquisition of a permanent ultrasound image documenting patency of the accessed vessel. The microwire was utilized to measure appropriate catheter length. A stiff glidewire was advanced to the level of the IVC. Under fluoroscopic guidance, the venotomy was serially dilated, ultimately allowing placement of a 16 cm temporary Mahurkar catheter with tip ultimately terminating within the superior aspect of the right atrium. Final catheter positioning was confirmed and documented with a spot radiographic image. The catheter aspirates and flushes normally. The catheter was flushed with appropriate volume heparin dwells. The catheter exit site was secured with a 0-Prolene retention suture. A dressing was applied. The patient tolerated the procedure well without immediate post procedural complication. IMPRESSION: Successful placement of a right internal jugular approach 16 cm temporary dialysis catheter with tip terminating with in the superior aspect of the right atrium. The catheter is ready for immediate use. PLAN: This catheter may be converted to a tunneled dialysis catheter at a later date as indicated. Electronically Signed   By: Sandi Mariscal M.D.   On: 07/29/2020 14:57   DG CHEST PORT 1 VIEW  Result Date: 08/01/2020 CLINICAL DATA:  Pneumonia EXAM: PORTABLE CHEST 1 VIEW COMPARISON:  07/25/2020 FINDINGS: Stable low volume chest with patchy bilateral airspace disease. Cardiomegaly accentuated by rotation. Dialysis catheter with tip at the upper right atrium. No visible effusion or air leak. IMPRESSION: Stable low volume chest with bilateral pneumonia. Electronically Signed    By: Monte Fantasia M.D.   On: 08/01/2020 07:49   ECHOCARDIOGRAM LIMITED  Result Date: 07/30/2020    ECHOCARDIOGRAM LIMITED REPORT   Patient Name:   [name] [name] Date of Exam: 07/30/2020 Medical Rec #:  [id number]              Height:       61.0 in Accession #:    [id number]             Weight:       151.0 lb Date of Birth:  10/17/1935              BSA:          1.676 m Patient Age:  84 years               BP:           160/98 mmHg Patient Gender: F                      HR:           98 bpm. Exam Location:  Inpatient Procedure: Limited Echo, Cardiac Doppler and Color Doppler Indications:    Atrial fibrillation  History:        Patient has prior history of Echocardiogram examinations, most                 recent 05/26/2015. Arrythmias:Atrial Fibrillation,                 Signs/Symptoms:Altered Mental Status and Shortness of Breath;                 Risk Factors:Diabetes, Hypertension and Dyslipidemia. COVID+,                 sepsis.  Sonographer:    Lavenia Atlas Referring Phys: [id number] CURTIS J WOODS IMPRESSIONS  1. Left ventricular ejection fraction, by estimation, is 55 to 60%. The left ventricle has normal function. The left ventricle has no regional wall motion abnormalities. There is mild concentric left ventricular hypertrophy. Left ventricular diastolic parameters are consistent with Grade I diastolic dysfunction (impaired relaxation). Elevated left ventricular end-diastolic pressure.  2. Right ventricular systolic function is normal. The right ventricular size is normal. There is mildly elevated pulmonary artery systolic pressure.  3. The mitral valve is normal in structure. Trivial mitral valve regurgitation. No evidence of mitral stenosis.  4. The aortic valve is normal in structure. Aortic valve regurgitation is not visualized. No aortic stenosis is present.  5. The inferior vena cava is normal in size with greater than 50% respiratory variability, suggesting right atrial pressure of 3  mmHg. FINDINGS  Left Ventricle: Left ventricular ejection fraction, by estimation, is 55 to 60%. The left ventricle has normal function. The left ventricle has no regional wall motion abnormalities. The left ventricular internal cavity size was normal in size. There is  mild concentric left ventricular hypertrophy. Left ventricular diastolic parameters are consistent with Grade I diastolic dysfunction (impaired relaxation). Elevated left ventricular end-diastolic pressure. Right Ventricle: The right ventricular size is normal. No increase in right ventricular wall thickness. Right ventricular systolic function is normal. There is mildly elevated pulmonary artery systolic pressure. The tricuspid regurgitant velocity is 3.23  m/s, and with an assumed right atrial pressure of 3 mmHg, the estimated right ventricular systolic pressure is 44.7 mmHg. Left Atrium: Left atrial size was normal in size. Right Atrium: Right atrial size was normal in size. Pericardium: There is no evidence of pericardial effusion. Mitral Valve: The mitral valve is normal in structure. Trivial mitral valve regurgitation. No evidence of mitral valve stenosis. Tricuspid Valve: The tricuspid valve is normal in structure. Tricuspid valve regurgitation is trivial. No evidence of tricuspid stenosis. Aortic Valve: The aortic valve is normal in structure. Aortic valve regurgitation is not visualized. No aortic stenosis is present. Aortic valve peak gradient measures 9.2 mmHg. Pulmonic Valve: The pulmonic valve was normal in structure. Pulmonic valve regurgitation is not visualized. No evidence of pulmonic stenosis. Aorta: The aortic root is normal in size and structure. Venous: The inferior vena cava is normal in size with greater than 50% respiratory variability, suggesting right atrial pressure of 3 mmHg. IAS/Shunts: No atrial  level shunt detected by color flow Doppler. LEFT VENTRICLE PLAX 2D LVIDd:         3.50 cm  Diastology LVIDs:         2.50 cm   LV e' medial:    3.26 cm/s LV PW:         1.30 cm  LV E/e' medial:  23.2 LV IVS:        1.20 cm  LV e' lateral:   6.09 cm/s LVOT diam:     2.00 cm  LV E/e' lateral: 12.4 LVOT Area:     3.14 cm  RIGHT VENTRICLE RV S prime:     5.22 cm/s LEFT ATRIUM         Index LA diam:    3.00 cm 1.79 cm/m  AORTIC VALVE AV Vmax:      152.00 cm/s AV Peak Grad: 9.2 mmHg  AORTA Ao Root diam: 2.80 cm MITRAL VALVE                TRICUSPID VALVE MV Area (PHT): 7.44 cm     TR Peak grad:   41.7 mmHg MV Decel Time: 102 msec     TR Vmax:        323.00 cm/s MV E velocity: 75.50 cm/s MV A velocity: 114.00 cm/s  SHUNTS MV E/A ratio:  0.66         Systemic Diam: 2.00 cm Skeet Latch MD Electronically signed by Skeet Latch MD Signature Date/Time: 07/30/2020/4:30:42 PM    Final     Labs:  CBC: Recent Labs    07/31/20 0050 08/01/20 0237 08/02/20 0354 08/04/20 0410  WBC 14.3* 16.8* 25.7* 21.9*  HGB 8.8* 7.0* 8.1* 8.1*  HCT 27.1* 21.3* 25.7* 24.9*  PLT 353 293 313 285    COAGS: Recent Labs    07/25/20 1113  INR 1.2  APTT 32    BMP: Recent Labs    01/30/20 1558 05/05/20 1237 07/25/20 0300 08/01/20 0237 08/02/20 0354 08/03/20 1108 08/04/20 0410  NA 139 140   < > 138 138 137 139  K 3.7 4.0   < > 4.3 4.0 3.9 4.1  CL 102 101   < > 100 97* 98 99  CO2 22 23   < > _0 GLUCOSE 151* 132*   < > 147* 123* 313* 91  BUN 41* 43*   < > 42* 59* 38* 44*  CALCIUM 8.7 8.4*   < > 8.0* 9.0 8.4* 8.8*  CREATININE 3.09* 2.73*   < > 3.98* 5.21* 4.06* 4.84*  GFRNONAA 13* 16*   < > 11* 8* 10* 8*  GFRAA 15* 18*  --   --   --   --   --    < > = values in this interval not displayed.    LIVER FUNCTION TESTS: Recent Labs    08/01/20 0237 08/02/20 0354 08/03/20 1108 08/04/20 0410  BILITOT 0.6 0.8 0.2* 0.6  AST _1 ALT _2 ALKPHOS 49 72 72 75  PROT 5.3* 6.6 5.9* 6.4*  ALBUMIN 2.1* 2.6* 2.4* 2.6*    TUMOR MARKERS: No results for input(s): AFPTM, CEA, CA199, CHROMGRNA in the last  8760 hours.  Assessment and Plan:  ESRD requiring hemodialysis: [name] [name]. [name] [name], 84 year old female, is tentatively scheduled for the conversion of her temporary dialysis catheter to a tunneled dialysis catheter 08/04/20 at the [facility] department. Telephone consent was obtained from her daughter,  [employer].   Risks and benefits discussed with the patient including, but not limited to bleeding, infection, vascular injury, pneumothorax which may require chest tube placement, air embolism or even death  All of the patient's questions were answered, patient is agreeable to proceed.  She has been NPO. Labs and vitals have been reviewed. IV heparin is infusing.   Consent signed and in chart.  Thank you for this interesting consult.  I greatly enjoyed meeting [name] [name] and look forward to participating in their care.  A copy of this report was sent to the requesting provider on this date.  Electronically Signed: Soyla Dryer, AGACNP-BC [phone] 08/04/2020, 12:14 PM   I spent a total of 20 Minutes    in face to face in clinical consultation, greater than 50% of which was counseling/coordinating care for tunneled dialysis catheter placement.

## 2020-08-04 NOTE — Progress Notes (Signed)
PT Cancellation Note  Patient Details Name: [name] [name] MRN: [id number] DOB: April 01, 1936   Cancelled Treatment:    Reason Eval/Treat Not Completed: Patient at procedure or test/unavailable. Pt in HD.   [facility] 08/04/2020, 8:52 AM [place] Pager [phone] Office [phone]

## 2020-08-04 NOTE — Progress Notes (Signed)
ANTICOAGULATION CONSULT NOTE - Follow Up Consult  Pharmacy Consult for Heparin Indication: atrial fibrillation  Allergies  Allergen Reactions  . Penicillins Swelling and Rash    Patient Measurements: Height: 5\' 1"  (154.9 cm) Weight: 69.2 kg (152 lb 8.9 oz) IBW/kg (Calculated) : 47.8 Heparin Dosing Weight: 60 kg  Vital Signs: Temp: 98.7 F (37.1 C) (12/28 1706) Temp Source: Oral (12/28 1706) BP: 135/65 (12/28 1850) Pulse Rate: 93 (12/28 1850)  Labs: Recent Labs    08/02/20 0354 08/03/20 0254 08/03/20 1108 08/04/20 0410 08/04/20 1747  HGB 8.1*  --   --  8.1*  --   HCT 25.7*  --   --  24.9*  --   PLT 313  --   --  285  --   HEPARINUNFRC 0.31 0.39  --  0.13* 0.72*  CREATININE 5.21*  --  4.06* 4.84*  --     Estimated Creatinine Clearance: 8 mL/min (A) (by C-G formula based on SCr of 4.84 mg/dL (H)).  Assessment 84 y.o. female with atrial fibrillation continues on heparin.   Confirmatory heparin level drawn ~10.5 hrs after heparin infusion was increased to 900 units/hr was 0.72 units/ml, which is just above the goal range for this pt. H/H 8.1/24.9, platelets 285.   Heparin infusion was stopped this afternoon (after heparin level was drawn) for IR procedure (conversion of temp catheter to tunneled HD catheter). Per Dr. Laurence Ferrari, pt had a lot of oozing after procedure, so we should wait 4 hrs before restarting the heparin infusion post-IR procedure.  Goal of Therapy:  Heparin level 0.3-0.7 units/ml Monitor platelets by anticoagulation protocol: Yes   Plan:  Restart heparin infusion at reduced rate of 850 units/hr four hr after IR procecedure  Check heparin level 8 hrs after restarting Monitor daily heparin level, CBC Monitor for signs/symptoms of bleeding F/U plan for [place] term anticoagulation  Gillermina Hu, PharmD, BCPS, [facility] Clinical Pharmacist 08/04/2020 7:11 PM

## 2020-08-04 NOTE — Progress Notes (Signed)
Pt is in restraints.  CPAP on standby.

## 2020-08-04 NOTE — Progress Notes (Signed)
While assessing patient, RN noticed bleeding from tunneled cath site. RN redressed with gauze and tegaderm and notified PharmD about bleeding and possibly holding on heparin infusion further. PharmD stated she would look into chart. No new orders at this time.

## 2020-08-04 NOTE — Progress Notes (Signed)
Patient off the unit for IR procedure.

## 2020-08-04 NOTE — Progress Notes (Signed)
PROGRESS NOTE    [name] [name] Charity  RSW:[id number] DOB: [birth year] DOA: 07/25/2020 PCP: [name], MD     Brief Narrative:  [name] [name] is an 84 year old female with history of RA, CKD 4, DM 2, HTN, HLD, OSA on CPAP, TIA, chronic diastolic CHF.   To the hospital with 5 days of cough, lethargy, confusion. Was found to be hypoxic, tested positive for Covid and was admitted to the hospital. She was also found to have acute kidney injury. She was also found to have A. fib with RVR. Cardiology and nephrology were consulted on admission. Renal function has not improved and the patient is transferred to [facility] in anticipation of dialysis initiation.   Subjective: 12/28 afebrile overnight, A/O x4.    Assessment & Plan: Covid vaccination; unvaccinated   Principal Problem:   Acute hypoxemic respiratory failure due to COVID-19 [facility]) Active Problems:   Essential hypertension   HLD (hyperlipidemia)   DM (diabetes mellitus), type 2 with renal complications (HCC)   Chronic diastolic HF (heart failure) ([place])   CAP (community acquired pneumonia)   CKD (chronic kidney disease), stage IV ([place])   AKI (acute kidney injury) ([place])   Sepsis ([place])   Atrial fibrillation with RVR ([place])  Acute respiratory failure with hypoxia/Covid pneumonia/suspected bacterial superinfection COVID-19 Labs  Recent Labs    08/02/20 0354 08/03/20 0254 08/03/20 1108 08/04/20 0410  DDIMER 1.87*  --  1.85* 2.11*  FERRITIN 2,349*  --  2,115* 2,274*  LDH 348* 330*  --  360*  CRP 6.9*  --  4.1* 3.4*    Lab Results  Component Value Date   SARSCOV2NAA POSITIVE (A) 07/25/2020   -Patient with CrCl<44ml/min therefore and eligible for Remdesivir or Baricitinib -In addition possible bacterial superinfection initial procalcitonin=Results for [name], [name] (MRN [id number]) as of 07/28/2020 19:45  Ref. Range 07/25/2020 08:40  Procalcitonin Latest Units: ng/mL 2.35  -Trend  procalcitonin -Cultures NGTD -12/22 spoke with pharmacy all antibiotics have been completed.   -Solu-Medrol 40 mg BID -Vitamins per Covid protocol -DuoNeb QID -Flutter valve -Incentive spirometry  Sepsis pneumonia (Covid 19/suspected CAP) -On admission criteria for sepsis tachycardia, tachypnea, leukocytosis -Cultures obtained, placed on antibiotics.  All physiologic signs of sepsis have resolved.  Elevated D-dimer -Unsure if elevation secondary to worsening disease process for secondary to patient's fluid contraction.  Patient just had HD last night.  Currently no increase in O2 demand. -If D-dimer continues to increase will obtain CTA chest after consulting with nephrologist (patient already on HD if they do not believe patient's kidneys recoverable will proceed).  Otherwise may just treat empirically because currently patient would not be able to cooperate with VQ scan -D-dimer decreased on 12/25 now started back up will hold up 1 more day to see if we have a trend before making decision -12/27 D-dimer slightly down today believe patient's elevated D-dimer partially due to her renal failure.  Acute on CKD stage IV (baseline Cr 2.4-2.7) -On presentation Cr 6.8 not improving -Transferred to [facility] per nephrology. -Does not currently fluid appear overloaded. -12/28 patient scheduled to obtain tunnel catheter. -12/28 Dr. Roney Jaffe Nephrology will let us know in the a.m. when patient will be ready for SNF  Anion gap metabolic acidosis -Worsening anion gap metabolic acidosis -37/16 1 amp sodium bicarb -12/22 nontunneled HD cath placed  Chronic diastolic CHF -BNP on admission= 259 -Strict in and out +3.8 L -Daily weight Filed Weights   08/02/20 1416 08/04/20 0415 08/04/20 0830  Weight: 68.1 kg 68.9  kg 69.2 kg  -12/27 discontinue Amiodarone drip -Amlodipine 10 mg daily -12/27 Amiodarone 200 mg daily -Clonidine 0.1 mg TID -12/27 Hydralazine IV PRN -12/22 Metoprolol 12.5 mg  BID Hold for MAP<60 -  A. fib with RVR -CHADS2Vasc score is 7 (HTN, age x 2, DM, TIA x2, gender), Can convert to [place] later in admission -Cardiology consulted -12/22 currently sinus arrhythmia currently NSR -Continue heparin drip -Last seen by cardiology on 12/18 per their note would consider outpatient monitor after systemic illness have resolved. -Cardiology recommends echocardiogram once rate better controlled. -12/22 Echocardiogram; diastolic dysfunction, pulmonary HTN see echo below  Essential HTN -See CHF  Pulmonary HTN -See CHF  Acute metabolic encephalopathy -81/27 resolved answer questions appropriately follows all commands  Hx TIA -Hold aspirin in the setting of heparin infusion -Lipitor 20 mg daily -12/22 LDL = 57: Goal LDL <70   DM type II uncontrolled with Hyperglycemia -12/22 Hemoglobin A1c= 7.7  -12/22 Levemir 7 units BID -12/27 increase NovoLog 6units qac -12/27 decrease sensitive SSI  -Tradjenta 5 mg daily  Mixed Anemia -Anemia panel most consistent with a mixed anemia -Occult blood pending      DVT prophylaxis: Heparin drip Code Status: Full Family Communication: 12/28 spoke with [facility] discussed plan of care answered all questions Status is: Inpatient    Dispo: The patient is from:               Anticipated d/c is to: Home              Anticipated d/c date is: 12/30              Patient currently unstable       Consultants:  Nephrology Cardiology  Procedures/Significant Events:  12/22 nontunneled HD catheter terminating within the superior aspect RIGHT atrium 12/23 echocardiogram limited;LVEF= 55 to 60%.  -Grade I diastolic dysfunction  Right Ventricle: Mldly elevated pulmonary artery systolic pressure. estimated  right ventricular systolic pressure is 51.7 mmHg.    I have personally reviewed and interpreted all radiology studies and my findings are as above.  VENTILATOR SETTINGS: HFNC 12/28 Flow; 4 L/min SPO2;  percent   Cultures 12/18 blood RIGHT AC negative 12/18 MRSA by PCR negative 12/18 blood right hand negative final x2    Antimicrobials: Anti-infectives (From admission, onward)   Start     Ordered Stop   08/04/20 1733  vancomycin (VANCOCIN) 1-5 GM/200ML-% IVPB       Note to Pharmacy: Fredric Dine   : cabinet override   08/04/20 1733 08/05/20 0544   08/04/20 1445  vancomycin (VANCOCIN) IVPB 1000 mg/200 mL premix        08/04/20 1356 08/04/20 1911   07/26/20 1000  cefTRIAXone (ROCEPHIN) 1 g in sodium chloride 0.9 % 100 mL IVPB        07/25/20 1138 07/29/20 0911   07/26/20 1000  doxycycline (VIBRAMYCIN) 100 mg in sodium chloride 0.9 % 250 mL IVPB        07/25/20 1138 07/27/20 2335   07/25/20 0500  levofloxacin (LEVAQUIN) IVPB 750 mg        07/25/20 0453 07/25/20 0824        Devices    LINES / TUBES:      Continuous Infusions: . diltiazem (CARDIZEM) infusion Stopped (07/27/20 0233)  . heparin Stopped (08/04/20 1748)  . vancomycin    . vancomycin 1,000 mg (08/04/20 1811)     Objective: Vitals:   08/04/20 1825 08/04/20 1830 08/04/20 1835 08/04/20 1850  BP: 139/61 Marland Kitchen)  148/64 (!) 148/71 135/65  Pulse: 90 87 91 93  Resp: (!) 24 (!) 24 (!) 24 (!) 23  Temp:      TempSrc:      SpO2: 98% 91% 95% 95%  Weight:      Height:        Intake/Output Summary (Last 24 hours) at 08/04/2020 1910 Last data filed at 08/04/2020 1214 Gross per 24 hour  Intake --  Output 2000 ml  Net -2000 ml   Filed Weights   08/02/20 1416 08/04/20 0415 08/04/20 0830  Weight: 68.1 kg 68.9 kg 69.2 kg   Physical Exam:  General: A/O x4, positive acute respiratory distress Eyes: negative scleral hemorrhage, negative anisocoria, negative icterus ENT: Negative Runny nose, negative gingival bleeding, Neck:  Negative scars, masses, torticollis, lymphadenopathy, JVD Lungs: decreased breath sounds bilaterally without wheezes or crackles Cardiovascular: Regular rate and rhythm without murmur  gallop or rub normal S1 and S2 Abdomen: negative abdominal pain, nondistended, positive soft, bowel sounds, no rebound, no ascites, no appreciable mass Extremities: No significant cyanosis, clubbing, or edema bilateral lower extremities Skin: Negative rashes, lesions, ulcers Psychiatric:  Negative depression, negative anxiety, negative fatigue, negative mania  Central nervous system:  Cranial nerves II through XII intact, tongue/uvula midline, all extremities muscle strength 5/5, sensation intact throughout, negative dysarthria, negative expressive aphasia, negative receptive aphasia.    Data Reviewed: Care during the described time interval was provided by me .  I have reviewed this patient's available data, including medical history, events of note, physical examination, and all test results as part of my evaluation.  CBC: Recent Labs  Lab 07/30/20 0500 07/31/20 0050 08/01/20 0237 08/02/20 0354 08/04/20 0410  WBC 23.1* 14.3* 16.8* 25.7* 21.9*  NEUTROABS 20.4* 12.9* 15.6* 23.9* 20.6*  HGB 8.5* 8.8* 7.0* 8.1* 8.1*  HCT 26.8* 27.1* 21.3* 25.7* 24.9*  MCV 67.3* 66.6* 67.8* 69.3* 69.7*  PLT 425* 353 293 313 314   Basic Metabolic Panel: Recent Labs  Lab 07/31/20 0050 08/01/20 0237 08/02/20 0354 08/03/20 0254 08/03/20 1108 08/04/20 0410  NA 140 138 138  --  137 139  K 3.7 4.3 4.0  --  3.9 4.1  CL 101 100 97*  --  98 99  CO2 22 26 25   --  27 26  GLUCOSE 130* 147* 123*  --  313* 91  BUN 96* 42* 59*  --  38* 44*  CREATININE 6.72* 3.98* 5.21*  --  4.06* 4.84*  CALCIUM 8.9 8.0* 9.0  --  8.4* 8.8*  MG 1.9 1.6* 1.9 1.9  --  2.1  PHOS 6.4* 3.9 5.3* 4.0  --  4.4   GFR: Estimated Creatinine Clearance: 8 mL/min (A) (by C-G formula based on SCr of 4.84 mg/dL (H)). Liver Function Tests: Recent Labs  Lab 07/31/20 0050 08/01/20 0237 08/02/20 0354 08/03/20 1108 08/04/20 0410  AST 25 21 19 19 19   ALT 18 14 18 18 18   ALKPHOS 66 49 72 72 75  BILITOT 0.9 0.6 0.8 0.2* 0.6  PROT 7.0  5.3* 6.6 5.9* 6.4*  ALBUMIN 2.7* 2.1* 2.6* 2.4* 2.6*   No results for input(s): LIPASE, AMYLASE in the last 168 hours. No results for input(s): AMMONIA in the last 168 hours. Coagulation Profile: No results for input(s): INR, PROTIME in the last 168 hours. Cardiac Enzymes: No results for input(s): CKTOTAL, CKMB, CKMBINDEX, TROPONINI in the last 168 hours. BNP (last 3 results) No results for input(s): PROBNP in the last 8760 hours. HbA1C: No results for input(s):  HGBA1C in the last 72 hours. CBG: Recent Labs  Lab 08/03/20 2005 08/04/20 0635 08/04/20 0740 08/04/20 1258 08/04/20 1735  GLUCAP 84 120* 122* 94 151*   Lipid Profile: No results for input(s): CHOL, HDL, LDLCALC, TRIG, CHOLHDL, LDLDIRECT in the last 72 hours. Thyroid Function Tests: No results for input(s): TSH, T4TOTAL, FREET4, T3FREE, THYROIDAB in the last 72 hours. Anemia Panel: Recent Labs    08/03/20 1108 08/04/20 0410  FERRITIN 2,115* 2,274*   Sepsis Labs: Recent Labs  Lab 07/28/20 2043 07/29/20 0147 07/30/20 0500  PROCALCITON 0.96 0.84 0.52    No results found for this or any previous visit (from the past 240 hour(s)).       Radiology Studies: No results found.      Scheduled Meds: . amiodarone  200 mg Oral Daily  . amLODipine  10 mg Oral Daily  . vitamin C  500 mg Oral Daily  . atorvastatin  20 mg Oral q1800  . chlorhexidine      . chlorhexidine  15 mL Mouth Rinse BID  . Chlorhexidine Gluconate Cloth  6 each Topical Q0600  . cloNIDine  0.1 mg Oral TID  . [START ON 08/07/2020] darbepoetin (ARANESP) injection - DIALYSIS  150 mcg Intravenous Q Fri-HD  . fentaNYL      . haloperidol lactate  2 mg Intravenous Once  . heparin sodium (porcine)      . insulin aspart  0-5 Units Subcutaneous QHS  . insulin aspart  0-9 Units Subcutaneous TID WC  . insulin aspart  6 Units Subcutaneous TID WC  . insulin detemir  7 Units Subcutaneous BID  . lidocaine      . linagliptin  5 mg Oral Daily  .  mouth rinse  15 mL Mouth Rinse q12n4p  . methylPREDNISolone (SOLU-MEDROL) injection  40 mg Intravenous Q12H  . metoprolol tartrate  12.5 mg Oral BID  . midazolam      . sodium chloride flush  10-40 mL Intracatheter Q12H  . sodium chloride flush  3 mL Intravenous Q12H  . zinc sulfate  220 mg Oral Daily   Continuous Infusions: . diltiazem (CARDIZEM) infusion Stopped (07/27/20 0233)  . heparin Stopped (08/04/20 1748)  . vancomycin    . vancomycin 1,000 mg (08/04/20 1811)     LOS: 10 days    Time spent:40 min    Jamari Diana, Geraldo Docker, MD [facility] Pager [phone]  If 7PM-7AM, please contact night-coverage [email or web address] Password [email or web address] 08/04/2020, 7:10 PM

## 2020-08-04 NOTE — Progress Notes (Signed)
Renal Navigator received notification from Dr. Carolin Sicks that patient needs outpatient HD referral for ESRD treatment. Referral submitted to [facility] clinic for COVID iso treatment. This will be a MWF 3rd shift schedule. Once patient is out of isolation, her schedule will change and her clinic will notify her of her new time. She must treat in isolation for 21 days from her positive test on 07/25/20 or until she tests negative and the clinic Medical Director approves for her to go to a negative shift.  All documents have been faxed with the exception of tunneled catheter procedure note, which Navigator will send when completed. Navigator will follow closely and follow up with team once patient has been accepted and seat secured.  Alphonzo Cruise, [place] Renal Navigator [phone]

## 2020-08-04 NOTE — Progress Notes (Signed)
No further site complications. Will resume heparin administration. PharmD made aware.

## 2020-08-04 NOTE — Plan of Care (Signed)
Problem: Education: Goal: Knowledge of General Education information will improve Description: Including pain rating scale, medication(s)/side effects and non-pharmacologic comfort measures 08/04/2020 0221 by Neta Mends, RN Outcome: Progressing 08/04/2020 0155 by Neta Mends, RN Outcome: Progressing   Problem: Health Behavior/Discharge Planning: Goal: Ability to manage health-related needs will improve 08/04/2020 0221 by Neta Mends, RN Outcome: Progressing 08/04/2020 0155 by Neta Mends, RN Outcome: Progressing   Problem: Clinical Measurements: Goal: Ability to maintain clinical measurements within normal limits will improve 08/04/2020 0221 by Neta Mends, RN Outcome: Progressing 08/04/2020 0155 by Neta Mends, RN Outcome: Progressing Goal: Will remain free from infection 08/04/2020 0221 by Neta Mends, RN Outcome: Progressing 08/04/2020 0155 by Neta Mends, RN Outcome: Progressing Goal: Diagnostic test results will improve 08/04/2020 0221 by Neta Mends, RN Outcome: Progressing 08/04/2020 0155 by Neta Mends, RN Outcome: Progressing Goal: Respiratory complications will improve 08/04/2020 0221 by Neta Mends, RN Outcome: Progressing 08/04/2020 0155 by Neta Mends, RN Outcome: Progressing Goal: Cardiovascular complication will be avoided 08/04/2020 0221 by Neta Mends, RN Outcome: Progressing 08/04/2020 0155 by Neta Mends, RN Outcome: Progressing   Problem: Activity: Goal: Risk for activity intolerance will decrease 08/04/2020 0221 by Neta Mends, RN Outcome: Progressing 08/04/2020 0155 by Neta Mends, RN Outcome: Progressing   Problem: Nutrition: Goal: Adequate nutrition will be maintained 08/04/2020 0221 by Neta Mends, RN Outcome: Progressing 08/04/2020 0155 by Neta Mends, RN Outcome: Progressing   Problem: Coping: Goal: Level  of anxiety will decrease 08/04/2020 0221 by Neta Mends, RN Outcome: Progressing 08/04/2020 0155 by Neta Mends, RN Outcome: Progressing   Problem: Elimination: Goal: Will not experience complications related to bowel motility 08/04/2020 0221 by Neta Mends, RN Outcome: Progressing 08/04/2020 0155 by Neta Mends, RN Outcome: Progressing Goal: Will not experience complications related to urinary retention 08/04/2020 0221 by Neta Mends, RN Outcome: Progressing 08/04/2020 0155 by Neta Mends, RN Outcome: Progressing   Problem: Pain Managment: Goal: General experience of comfort will improve 08/04/2020 0221 by Neta Mends, RN Outcome: Progressing 08/04/2020 0155 by Neta Mends, RN Outcome: Progressing   Problem: Safety: Goal: Ability to remain free from injury will improve 08/04/2020 0221 by Neta Mends, RN Outcome: Progressing 08/04/2020 0155 by Neta Mends, RN Outcome: Progressing   Problem: Skin Integrity: Goal: Risk for impaired skin integrity will decrease 08/04/2020 0221 by Neta Mends, RN Outcome: Progressing 08/04/2020 0155 by Neta Mends, RN Outcome: Progressing   Problem: Education: Goal: Knowledge of risk factors and measures for prevention of condition will improve 08/04/2020 0221 by Neta Mends, RN Outcome: Progressing 08/04/2020 0155 by Neta Mends, RN Outcome: Progressing   Problem: Coping: Goal: Psychosocial and spiritual needs will be supported 08/04/2020 0221 by Neta Mends, RN Outcome: Progressing 08/04/2020 0155 by Neta Mends, RN Outcome: Progressing   Problem: Respiratory: Goal: Will maintain a patent airway 08/04/2020 0221 by Neta Mends, RN Outcome: Progressing 08/04/2020 0155 by Neta Mends, RN Outcome: Progressing Goal: Complications related to the disease process, condition or treatment will be avoided or  minimized 08/04/2020 0221 by Neta Mends, RN Outcome: Progressing 08/04/2020 0155 by Neta Mends, RN Outcome: Progressing   Problem: Education: Goal: Ability to describe self-care measures that may prevent or decrease complications (Diabetes Survival Skills Education) will improve 08/04/2020 0221 by Neta Mends, RN Outcome: Progressing 08/04/2020 0155 by Neta Mends,  RN Outcome: Progressing Goal: Individualized Educational Video(s) 08/04/2020 0221 by Neta Mends, RN Outcome: Progressing 08/04/2020 0155 by Neta Mends, RN Outcome: Progressing   Problem: Coping: Goal: Ability to adjust to condition or change in health will improve 08/04/2020 0221 by Neta Mends, RN Outcome: Progressing 08/04/2020 0155 by Neta Mends, RN Outcome: Progressing   Problem: Fluid Volume: Goal: Ability to maintain a balanced intake and output will improve 08/04/2020 0221 by Neta Mends, RN Outcome: Progressing 08/04/2020 0155 by Neta Mends, RN Outcome: Progressing   Problem: Health Behavior/Discharge Planning: Goal: Ability to identify and utilize available resources and services will improve 08/04/2020 0221 by Neta Mends, RN Outcome: Progressing 08/04/2020 0155 by Neta Mends, RN Outcome: Progressing Goal: Ability to manage health-related needs will improve 08/04/2020 0221 by Neta Mends, RN Outcome: Progressing 08/04/2020 0155 by Neta Mends, RN Outcome: Progressing   Problem: Metabolic: Goal: Ability to maintain appropriate glucose levels will improve 08/04/2020 0221 by Neta Mends, RN Outcome: Progressing 08/04/2020 0155 by Neta Mends, RN Outcome: Progressing   Problem: Nutritional: Goal: Maintenance of adequate nutrition will improve 08/04/2020 0221 by Neta Mends, RN Outcome: Progressing 08/04/2020 0155 by Neta Mends, RN Outcome: Progressing Goal:  Progress toward achieving an optimal weight will improve 08/04/2020 0221 by Neta Mends, RN Outcome: Progressing 08/04/2020 0155 by Neta Mends, RN Outcome: Progressing   Problem: Skin Integrity: Goal: Risk for impaired skin integrity will decrease 08/04/2020 0221 by Neta Mends, RN Outcome: Progressing 08/04/2020 0155 by Neta Mends, RN Outcome: Progressing   Problem: Tissue Perfusion: Goal: Adequacy of tissue perfusion will improve 08/04/2020 0221 by Neta Mends, RN Outcome: Progressing 08/04/2020 0155 by Neta Mends, RN Outcome: Progressing   Problem: Education: Goal: Knowledge of disease or condition will improve 08/04/2020 0221 by Neta Mends, RN Outcome: Progressing 08/04/2020 0155 by Neta Mends, RN Outcome: Progressing Goal: Understanding of medication regimen will improve 08/04/2020 0221 by Neta Mends, RN Outcome: Progressing 08/04/2020 0155 by Neta Mends, RN Outcome: Progressing Goal: Individualized Educational Video(s) 08/04/2020 0221 by Neta Mends, RN Outcome: Progressing 08/04/2020 0155 by Neta Mends, RN Outcome: Progressing   Problem: Activity: Goal: Ability to tolerate increased activity will improve 08/04/2020 0221 by Neta Mends, RN Outcome: Progressing 08/04/2020 0155 by Neta Mends, RN Outcome: Progressing   Problem: Cardiac: Goal: Ability to achieve and maintain adequate cardiopulmonary perfusion will improve 08/04/2020 0221 by Neta Mends, RN Outcome: Progressing 08/04/2020 0155 by Neta Mends, RN Outcome: Progressing   Problem: Health Behavior/Discharge Planning: Goal: Ability to safely manage health-related needs after discharge will improve 08/04/2020 0221 by Neta Mends, RN Outcome: Progressing 08/04/2020 0155 by Neta Mends, RN Outcome: Progressing   Problem: Safety: Goal: Non-violent  Restraint(s) Outcome: Progressing

## 2020-08-04 NOTE — Progress Notes (Signed)
Patient back on the unit from dialysis. [place] green. No changes at this time. Will continue to monitor.

## 2020-08-04 NOTE — Progress Notes (Signed)
Physical Therapy Treatment Patient Details Name: [name] [name] MRN: [id number] DOB: [birth year] Today's Date: 08/04/2020    History of Present Illness Pt is an 84 y.o. female admitted 07/25/20 with 5 days of cough, lethargy, confusion; found to be hypoxic and tested (+) COVID-19, also with AKI, afib with RVR, sepsis. Likely plan for temporary HD cath placed and HD initiation 12/22. PMH includes RA, CKD 4, DM2, HTN, OSA on CPAP, TIA, CHF.    PT Comments    Slow progress with mobility and pt remains confused. Family plans to take pt home. She will need 24 hour assist/supervision.    Follow Up Recommendations  Supervision/Assistance - 24 hour;Home health PT (family is going to take pt home with 24hour assist.)     Equipment Recommendations  3in1 (PT)    Recommendations for Other Services       Precautions / Restrictions Precautions Precautions: Fall    Mobility  Bed Mobility Overal bed mobility: Needs Assistance Bed Mobility: Supine to Sit;Sit to Supine     Supine to sit: Min guard Sit to supine: Min guard   General bed mobility comments: assist for safety and lines  Transfers Overall transfer level: Needs assistance Equipment used: 4-wheeled walker Transfers: Sit to/from Stand Sit to Stand: Min assist         General transfer comment: Assist for balance  Ambulation/Gait Ambulation/Gait assistance: Min assist Gait Distance (Feet): 60 Feet Assistive device: 4-wheeled walker Gait Pattern/deviations: Step-through pattern;Decreased stride length;Trunk flexed;Drifts right/left Gait velocity: Decreased Gait velocity interpretation: <1.8 ft/sec, indicate of risk for recurrent falls General Gait Details: Assist for balance and support. Fatigued quickly and sat on rollator seat for return to room.   Stairs             Wheelchair Mobility    Modified Rankin (Stroke Patients Only)       Balance Overall balance assessment: Needs  assistance Sitting-balance support: Feet unsupported Sitting balance-Leahy Scale: Fair       Standing balance-Leahy Scale: Poor Standing balance comment: walker and min guard for static standing                            Cognition Arousal/Alertness: Awake/alert Behavior During Therapy: Impulsive;Flat affect Overall Cognitive Status: Impaired/Different from baseline Area of Impairment: Attention;Memory;Following commands;Awareness;Problem solving;Orientation;Safety/judgement                 Orientation Level: Disoriented to;Time;Place;Situation Current Attention Level: Sustained Memory: Decreased short-term memory;Decreased recall of precautions Following Commands: Follows one step commands with increased time;Follows one step commands inconsistently Safety/Judgement: Decreased awareness of safety;Decreased awareness of deficits Awareness: Intellectual Problem Solving: Slow processing;Decreased initiation;Difficulty sequencing;Requires verbal cues;Requires tactile cues General Comments: Pt asking to get OOB every 5-10 seconds as I was arranging lines, room etc.      Exercises      General Comments General comments (skin integrity, edema, etc.): Pt on 3L at rest with SpO2 83%. Incr to 4L with SpO2 to 87-88% with amb. Incr O2 to 6L for remainder of amb with SpO2 89-90%. Left O2 at 4L after treatment with SpO2 93%.      Pertinent Vitals/Pain Pain Assessment: Faces Faces Pain Scale: No hurt    Home Living                      Prior Function            PT Goals (current goals can now be  found in the care plan section) Progress towards PT goals: Progressing toward goals    Frequency    Min 3X/week      PT Plan Discharge plan needs to be updated    Co-evaluation              AM-PAC PT "6 Clicks" Mobility   Outcome Measure  Help needed turning from your back to your side while in a flat bed without using bedrails?: A Little Help  needed moving from lying on your back to sitting on the side of a flat bed without using bedrails?: A Little Help needed moving to and from a bed to a chair (including a wheelchair)?: A Little Help needed standing up from a chair using your arms (e.g., wheelchair or bedside chair)?: A Little Help needed to walk in hospital room?: A Little Help needed climbing 3-5 steps with a railing? : A Lot 6 Click Score: 17    End of Session Equipment Utilized During Treatment: Gait belt;Oxygen Activity Tolerance: Patient limited by fatigue Patient left: with call bell/phone within reach;in bed;with bed alarm set;with restraints reapplied Nurse Communication: Mobility status PT Visit Diagnosis: Other abnormalities of gait and mobility (R26.89);Muscle weakness (generalized) (M62.81)     Time: 8727-6184 PT Time Calculation (min) (ACUTE ONLY): 36 min  Charges:  $Gait Training: 23-37 mins                     [place] Pager [phone] Office [place] 08/04/2020, 5:01 PM

## 2020-08-04 NOTE — Progress Notes (Signed)
Upon entering patients room this nurse noticed patients Biopatch and partial of dressing missing over patients hemodialysis catheter. Although patient was in bilateral mittens. Charge nurse was informed and this nurse covered with a new Biopatch and Tegaderm. IV consult placed for new dressing. Informed by NA that patient removed Biopatch again. IV team notified and arrived on the unit to replace dressing.

## 2020-08-04 NOTE — Progress Notes (Signed)
ANTICOAGULATION CONSULT NOTE - Follow Up Consult  Pharmacy Consult for heparin Indication: atrial fibrillation  Labs: Recent Labs    08/02/20 0354 08/03/20 0254 08/03/20 1108 08/04/20 0410  HGB 8.1*  --   --  8.1*  HCT 25.7*  --   --  24.9*  PLT 313  --   --  285  HEPARINUNFRC 0.31 0.39  --  0.13*  CREATININE 5.21*  --  4.06* 4.84*    Assessment: 84yo female subtherapeutic on heparin after several levels at low end of goal; no gtt issues or signs of bleeding per RN other than when pt pulled IV line out prior to 10p; line was replaced and heparin has been running smoothly since.  Goal of Therapy:  Heparin level 0.3-0.7 units/ml   Plan:  Will increase heparin gtt by 1-2 units/kg/hr to 900 units/hr and check level in 8 hours.    Wynona Neat, PharmD, BCPS  08/04/2020,6:57 AM

## 2020-08-04 NOTE — Progress Notes (Signed)
[facility] NEPHROLOGY PROGRESS NOTE  Assessment/ Plan:  # AKIon CKD stage IV, dialysis dependent and now progressed to new ESRD: In setting of sepsis due to covid-19 and CAP. Baseline Scr 2.7-3. Renal US without obstruction but chronicity. No improvement with IVF's and rising BUN/Cr.  Started dialysis on 12/22 after placement of temporary HD catheter.  Since then she has been tolerating dialysis well.  Last HD on 12/26 with 500 cc UF.  No sign of renal recovery so far. Consulted IR for conversion to tunneled HD catheter Social worker consulted for outpatient arrangement of dialysis for ESRD. HD today.   #Acute hypoxemic respiratory failure due to covid-19 and community acquired PNA- on IV steroids, but not on remdesivir due to AKI and no baricitinib due to bacterial infection. Still on high flow O2.  # Sepsis due to covid-19 and CAP- improving.  # Atrial fib with RVR- started on amiodarone per Cardiology.  # Acute metabolic encephalopathy- likely multifactorial with AKI, covid-19, hypoxia, and sepsis.   # Anemia of acute illness and CKD stage IV- follow H/H and iron stores. -  added ESA but hold on iron- hgb stable.  # HTN- stable.  Continue amlodipine.  #Metabolic acidosis: Now managed with dialysis.  Discontinued sodium bicarbonate.  Subjective: Seen and examined.  There is minimal urine output however worsening renal parameters of dialysis.  Receiving dialysis today, tolerating well so far.  Objective Vital signs in last 24 hours: Vitals:   08/04/20 0844 08/04/20 0900 08/04/20 0930 08/04/20 1000  BP: (!) 165/71 (!) 153/73 (!) 147/68 (!) 150/79  Pulse:      Resp: 19 (!) 26 20   Temp:      TempSrc:      SpO2:      Weight:      Height:       Weight change: 0.4 kg  Intake/Output Summary (Last 24 hours) at 08/04/2020 1017 Last data filed at 08/03/2020 1500 Gross per 24 hour  Intake 120 ml  Output --  Net 120 ml       Labs: Basic Metabolic  Panel: Recent Labs  Lab 08/02/20 0354 08/03/20 0254 08/03/20 1108 08/04/20 0410  NA 138  --  137 139  K 4.0  --  3.9 4.1  CL 97*  --  98 99  CO2 25  --  27 26  GLUCOSE 123*  --  313* 91  BUN 59*  --  38* 44*  CREATININE 5.21*  --  4.06* 4.84*  CALCIUM 9.0  --  8.4* 8.8*  PHOS 5.3* 4.0  --  4.4   Liver Function Tests: Recent Labs  Lab 08/02/20 0354 08/03/20 1108 08/04/20 0410  AST 19 19 19   ALT 18 18 18   ALKPHOS 72 72 75  BILITOT 0.8 0.2* 0.6  PROT 6.6 5.9* 6.4*  ALBUMIN 2.6* 2.4* 2.6*   No results for input(s): LIPASE, AMYLASE in the last 168 hours. No results for input(s): AMMONIA in the last 168 hours. CBC: Recent Labs  Lab 07/30/20 0500 07/31/20 0050 08/01/20 0237 08/02/20 0354 08/04/20 0410  WBC 23.1* 14.3* 16.8* 25.7* 21.9*  NEUTROABS 20.4* 12.9* 15.6* 23.9* 20.6*  HGB 8.5* 8.8* 7.0* 8.1* 8.1*  HCT 26.8* 27.1* 21.3* 25.7* 24.9*  MCV 67.3* 66.6* 67.8* 69.3* 69.7*  PLT 425* 353 293 313 285   Cardiac Enzymes: No results for input(s): CKTOTAL, CKMB, CKMBINDEX, TROPONINI in the last 168 hours. CBG: Recent Labs  Lab 08/03/20 1148 08/03/20 1653 08/03/20 2005 08/04/20 9233  08/04/20 0740  GLUCAP 233* 116* 84 120* 122*    Iron Studies:  Recent Labs    08/04/20 0410  FERRITIN 2,274*   Studies/Results: No results found.  Medications: Infusions: . sodium chloride    . sodium chloride    . diltiazem (CARDIZEM) infusion Stopped (07/27/20 0233)  . heparin 900 Units/hr (08/04/20 0709)    Scheduled Medications: . amiodarone  200 mg Oral Daily  . amLODipine  10 mg Oral Daily  . vitamin C  500 mg Oral Daily  . atorvastatin  20 mg Oral q1800  . chlorhexidine  15 mL Mouth Rinse BID  . Chlorhexidine Gluconate Cloth  6 each Topical Q0600  . cloNIDine  0.1 mg Oral TID  . [START ON 08/07/2020] darbepoetin (ARANESP) injection - DIALYSIS  150 mcg Intravenous Q Fri-HD  . haloperidol lactate  2 mg Intravenous Once  . heparin sodium (porcine)      .  insulin aspart  0-5 Units Subcutaneous QHS  . insulin aspart  0-9 Units Subcutaneous TID WC  . insulin aspart  6 Units Subcutaneous TID WC  . insulin detemir  7 Units Subcutaneous BID  . linagliptin  5 mg Oral Daily  . mouth rinse  15 mL Mouth Rinse q12n4p  . methylPREDNISolone (SOLU-MEDROL) injection  40 mg Intravenous Q12H  . metoprolol tartrate  12.5 mg Oral BID  . sodium chloride flush  10-40 mL Intracatheter Q12H  . sodium chloride flush  3 mL Intravenous Q12H  . zinc sulfate  220 mg Oral Daily    have reviewed scheduled and prn medications.  Physical Exam: General:NAD, comfortable Heart:RRR, s1s2 nl Lungs: Basal rhonchi, no wheezes Abdomen:soft, Non-tender, non-distended Extremities:No edema Dialysis Access: IJ temporary HD catheter, site clean  [name] [name] 08/04/2020,10:17 AM  LOS: 10 days  Pager: [phone]

## 2020-08-04 NOTE — Plan of Care (Signed)
  Problem: Education: Goal: Knowledge of General Education information will improve Description: Including pain rating scale, medication(s)/side effects and non-pharmacologic comfort measures Outcome: Progressing   Problem: Health Behavior/Discharge Planning: Goal: Ability to manage health-related needs will improve Outcome: Progressing   Problem: Clinical Measurements: Goal: Ability to maintain clinical measurements within normal limits will improve Outcome: Progressing Goal: Will remain free from infection Outcome: Progressing Goal: Diagnostic test results will improve Outcome: Progressing Goal: Respiratory complications will improve Outcome: Progressing Goal: Cardiovascular complication will be avoided Outcome: Progressing   Problem: Activity: Goal: Risk for activity intolerance will decrease Outcome: Progressing   Problem: Nutrition: Goal: Adequate nutrition will be maintained Outcome: Progressing   Problem: Coping: Goal: Level of anxiety will decrease Outcome: Progressing   Problem: Elimination: Goal: Will not experience complications related to bowel motility Outcome: Progressing Goal: Will not experience complications related to urinary retention Outcome: Progressing   Problem: Pain Managment: Goal: General experience of comfort will improve Outcome: Progressing   Problem: Safety: Goal: Ability to remain free from injury will improve Outcome: Progressing   Problem: Skin Integrity: Goal: Risk for impaired skin integrity will decrease Outcome: Progressing   Problem: Education: Goal: Knowledge of risk factors and measures for prevention of condition will improve Outcome: Progressing   Problem: Coping: Goal: Psychosocial and spiritual needs will be supported Outcome: Progressing   Problem: Respiratory: Goal: Will maintain a patent airway Outcome: Progressing Goal: Complications related to the disease process, condition or treatment will be avoided or  minimized Outcome: Progressing   Problem: Education: Goal: Ability to describe self-care measures that may prevent or decrease complications (Diabetes Survival Skills Education) will improve Outcome: Progressing Goal: Individualized Educational Video(s) Outcome: Progressing   Problem: Coping: Goal: Ability to adjust to condition or change in health will improve Outcome: Progressing   Problem: Fluid Volume: Goal: Ability to maintain a balanced intake and output will improve Outcome: Progressing   Problem: Health Behavior/Discharge Planning: Goal: Ability to identify and utilize available resources and services will improve Outcome: Progressing Goal: Ability to manage health-related needs will improve Outcome: Progressing   Problem: Metabolic: Goal: Ability to maintain appropriate glucose levels will improve Outcome: Progressing   Problem: Nutritional: Goal: Maintenance of adequate nutrition will improve Outcome: Progressing Goal: Progress toward achieving an optimal weight will improve Outcome: Progressing   Problem: Skin Integrity: Goal: Risk for impaired skin integrity will decrease Outcome: Progressing   Problem: Tissue Perfusion: Goal: Adequacy of tissue perfusion will improve Outcome: Progressing   Problem: Education: Goal: Knowledge of disease or condition will improve Outcome: Progressing Goal: Understanding of medication regimen will improve Outcome: Progressing Goal: Individualized Educational Video(s) Outcome: Progressing   Problem: Activity: Goal: Ability to tolerate increased activity will improve Outcome: Progressing   Problem: Cardiac: Goal: Ability to achieve and maintain adequate cardiopulmonary perfusion will improve Outcome: Progressing   Problem: Health Behavior/Discharge Planning: Goal: Ability to safely manage health-related needs after discharge will improve Outcome: Progressing   Problem: Safety: Goal: Non-violent  Restraint(s) Outcome: Progressing

## 2020-08-04 NOTE — Procedures (Signed)
Interventional Radiology Procedure Note  Procedure: Conversion of temp cath to 19 cm Palindrome tunneled HD catheter.  Tip sin the RA and catheter ready for use.   Complications: None  Estimated Blood Loss: None  Recommendations: - Routine line care   Signed,  Criselda Peaches, MD

## 2020-08-04 NOTE — Progress Notes (Signed)
Patient off the unit to dialysis. Bilateral wrist restraints in place per MD order.

## 2020-08-04 NOTE — Plan of Care (Signed)
  Problem: Education: Goal: Knowledge of General Education information will improve Description: Including pain rating scale, medication(s)/side effects and non-pharmacologic comfort measures Outcome: Progressing   Problem: Health Behavior/Discharge Planning: Goal: Ability to manage health-related needs will improve Outcome: Progressing   Problem: Clinical Measurements: Goal: Ability to maintain clinical measurements within normal limits will improve Outcome: Progressing Goal: Will remain free from infection Outcome: Progressing Goal: Diagnostic test results will improve Outcome: Progressing Goal: Respiratory complications will improve Outcome: Progressing Goal: Cardiovascular complication will be avoided Outcome: Progressing   Problem: Activity: Goal: Risk for activity intolerance will decrease Outcome: Progressing   Problem: Nutrition: Goal: Adequate nutrition will be maintained Outcome: Progressing   Problem: Coping: Goal: Level of anxiety will decrease Outcome: Progressing   Problem: Elimination: Goal: Will not experience complications related to bowel motility Outcome: Progressing Goal: Will not experience complications related to urinary retention Outcome: Progressing   Problem: Pain Managment: Goal: General experience of comfort will improve Outcome: Progressing   Problem: Safety: Goal: Ability to remain free from injury will improve Outcome: Progressing   Problem: Skin Integrity: Goal: Risk for impaired skin integrity will decrease Outcome: Progressing   Problem: Education: Goal: Knowledge of risk factors and measures for prevention of condition will improve Outcome: Progressing   Problem: Coping: Goal: Psychosocial and spiritual needs will be supported Outcome: Progressing   Problem: Respiratory: Goal: Will maintain a patent airway Outcome: Progressing Goal: Complications related to the disease process, condition or treatment will be avoided or  minimized Outcome: Progressing   Problem: Education: Goal: Ability to describe self-care measures that may prevent or decrease complications (Diabetes Survival Skills Education) will improve Outcome: Progressing Goal: Individualized Educational Video(s) Outcome: Progressing   Problem: Coping: Goal: Ability to adjust to condition or change in health will improve Outcome: Progressing   Problem: Fluid Volume: Goal: Ability to maintain a balanced intake and output will improve Outcome: Progressing   Problem: Health Behavior/Discharge Planning: Goal: Ability to identify and utilize available resources and services will improve Outcome: Progressing Goal: Ability to manage health-related needs will improve Outcome: Progressing   Problem: Metabolic: Goal: Ability to maintain appropriate glucose levels will improve Outcome: Progressing   Problem: Nutritional: Goal: Maintenance of adequate nutrition will improve Outcome: Progressing Goal: Progress toward achieving an optimal weight will improve Outcome: Progressing   Problem: Skin Integrity: Goal: Risk for impaired skin integrity will decrease Outcome: Progressing   Problem: Tissue Perfusion: Goal: Adequacy of tissue perfusion will improve Outcome: Progressing   Problem: Education: Goal: Knowledge of disease or condition will improve Outcome: Progressing Goal: Understanding of medication regimen will improve Outcome: Progressing Goal: Individualized Educational Video(s) Outcome: Progressing   Problem: Activity: Goal: Ability to tolerate increased activity will improve Outcome: Progressing   Problem: Cardiac: Goal: Ability to achieve and maintain adequate cardiopulmonary perfusion will improve Outcome: Progressing   Problem: Health Behavior/Discharge Planning: Goal: Ability to safely manage health-related needs after discharge will improve Outcome: Progressing

## 2020-08-04 NOTE — Progress Notes (Signed)
Due to IR scheduling patient is now tentatively scheduled for tunneled HD catheter placement 08/05/20. NPO after midnight.   Please call IR with any questions.  Soyla Dryer, [place] [phone] 08/04/2020, 4:57 PM

## 2020-08-04 NOTE — Progress Notes (Signed)
Patients heparin paused for IR procedure.

## 2020-08-05 DIAGNOSIS — J9601 Acute respiratory failure with hypoxia: Secondary | ICD-10-CM | POA: Diagnosis not present

## 2020-08-05 DIAGNOSIS — U071 COVID-19: Secondary | ICD-10-CM | POA: Diagnosis not present

## 2020-08-05 LAB — CBC WITH DIFFERENTIAL/PLATELET
Abs Immature Granulocytes: 0.24 10*3/uL — ABNORMAL HIGH (ref 0.00–0.07)
Basophils Absolute: 0 10*3/uL (ref 0.0–0.1)
Basophils Relative: 0 %
Eosinophils Absolute: 0 10*3/uL (ref 0.0–0.5)
Eosinophils Relative: 0 %
HCT: 23.9 % — ABNORMAL LOW (ref 36.0–46.0)
Hemoglobin: 7.5 g/dL — ABNORMAL LOW (ref 12.0–15.0)
Immature Granulocytes: 2 %
Lymphocytes Relative: 4 %
Lymphs Abs: 0.6 10*3/uL — ABNORMAL LOW (ref 0.7–4.0)
MCH: 22.1 pg — ABNORMAL LOW (ref 26.0–34.0)
MCHC: 31.4 g/dL (ref 30.0–36.0)
MCV: 70.5 fL — ABNORMAL LOW (ref 80.0–100.0)
Monocytes Absolute: 0.7 10*3/uL (ref 0.1–1.0)
Monocytes Relative: 4 %
Neutro Abs: 14.5 10*3/uL — ABNORMAL HIGH (ref 1.7–7.7)
Neutrophils Relative %: 90 %
Platelets: 228 10*3/uL (ref 150–400)
RBC: 3.39 MIL/uL — ABNORMAL LOW (ref 3.87–5.11)
RDW: 17.5 % — ABNORMAL HIGH (ref 11.5–15.5)
WBC: 16 10*3/uL — ABNORMAL HIGH (ref 4.0–10.5)
nRBC: 1.7 % — ABNORMAL HIGH (ref 0.0–0.2)

## 2020-08-05 LAB — COMPREHENSIVE METABOLIC PANEL
ALT: 18 U/L (ref 0–44)
AST: 17 U/L (ref 15–41)
Albumin: 2.4 g/dL — ABNORMAL LOW (ref 3.5–5.0)
Alkaline Phosphatase: 72 U/L (ref 38–126)
Anion gap: 13 (ref 5–15)
BUN: 37 mg/dL — ABNORMAL HIGH (ref 8–23)
CO2: 23 mmol/L (ref 22–32)
Calcium: 8.5 mg/dL — ABNORMAL LOW (ref 8.9–10.3)
Chloride: 100 mmol/L (ref 98–111)
Creatinine, Ser: 3.91 mg/dL — ABNORMAL HIGH (ref 0.44–1.00)
GFR, Estimated: 11 mL/min — ABNORMAL LOW (ref >60)
Glucose, Bld: 167 mg/dL — ABNORMAL HIGH (ref 70–99)
Potassium: 3.9 mmol/L (ref 3.5–5.1)
Sodium: 136 mmol/L (ref 135–145)
Total Bilirubin: 0.7 mg/dL (ref 0.3–1.2)
Total Protein: 5.5 g/dL — ABNORMAL LOW (ref 6.5–8.1)

## 2020-08-05 LAB — GLUCOSE, CAPILLARY
Glucose-Capillary: 160 mg/dL — ABNORMAL HIGH (ref 70–99)
Glucose-Capillary: 120 mg/dL — ABNORMAL HIGH (ref 70–99)
Glucose-Capillary: 91 mg/dL (ref 70–99)
Glucose-Capillary: 58 mg/dL — ABNORMAL LOW (ref 70–99)
Glucose-Capillary: 62 mg/dL — ABNORMAL LOW (ref 70–99)
Glucose-Capillary: 66 mg/dL — ABNORMAL LOW (ref 70–99)
Glucose-Capillary: 145 mg/dL — ABNORMAL HIGH (ref 70–99)
Glucose-Capillary: 60 mg/dL — ABNORMAL LOW (ref 70–99)

## 2020-08-05 LAB — C-REACTIVE PROTEIN: CRP: 2.1 mg/dL — ABNORMAL HIGH (ref <1.0)

## 2020-08-05 LAB — FERRITIN: Ferritin: 2029 ng/mL — ABNORMAL HIGH (ref 11–307)

## 2020-08-05 LAB — PHOSPHORUS: Phosphorus: 4.9 mg/dL — ABNORMAL HIGH (ref 2.5–4.6)

## 2020-08-05 LAB — HEPARIN LEVEL (UNFRACTIONATED): Heparin Unfractionated: 0.57 IU/mL (ref 0.30–0.70)

## 2020-08-05 LAB — LACTATE DEHYDROGENASE: LDH: 316 U/L — ABNORMAL HIGH (ref 98–192)

## 2020-08-05 LAB — D-DIMER, QUANTITATIVE: D-Dimer, Quant: 1.97 ug/mL-FEU — ABNORMAL HIGH (ref 0.00–0.50)

## 2020-08-05 LAB — MAGNESIUM: Magnesium: 2 mg/dL (ref 1.7–2.4)

## 2020-08-05 MED ORDER — APIXABAN 2.5 MG PO TABS
2.5000 mg | ORAL_TABLET | Freq: Two times a day (BID) | ORAL | Status: DC
  Administered 2020-08-05 – 2020-08-14 (×18): 2.5 mg via ORAL
  Filled 2020-08-05 (×19): qty 1

## 2020-08-05 NOTE — Progress Notes (Signed)
PROGRESS NOTE    Orrie Lascano Honsinger  YQM:[id number] DOB: Apr 11, 1936 DOA: 07/25/2020 PCP: [name], MD     Brief Narrative:  [name] [name] is an 84 year old female with history of RA, CKD 4, DM 2, HTN, HLD, OSA on CPAP, TIA, chronic diastolic CHF.   To the hospital with 5 days of cough, lethargy, confusion. Was found to be hypoxic, tested positive for Covid and was admitted to the hospital. She was also found to have acute kidney injury. She was also found to have A. fib with RVR. Cardiology and nephrology were consulted on admission. Renal function has not improved and the patient is transferred to [facility] in anticipation of dialysis initiation.   Subjective: Patient seen and examined.  No complaints.  Assessment & Plan: Covid vaccination; unvaccinated   Principal Problem:   Acute hypoxemic respiratory failure due to COVID-19 [facility]) Active Problems:   Essential hypertension   HLD (hyperlipidemia)   DM (diabetes mellitus), type 2 with renal complications (HCC)   Chronic diastolic HF (heart failure) ([place])   CAP (community acquired pneumonia)   CKD (chronic kidney disease), stage IV ([place])   AKI (acute kidney injury) ([place])   Sepsis ([place])   Atrial fibrillation with RVR ([place])  Acute respiratory failure with hypoxia/Covid pneumonia/suspected bacterial superinfection COVID-19 Labs  Recent Labs    08/03/20 0254 08/03/20 1108 08/04/20 0410 08/05/20 1012  DDIMER  --  1.85* 2.11* 1.97*  FERRITIN  --  2,115* 2,274* 2,029*  LDH 330*  --  360* 316*  CRP  --  4.1* 3.4* 2.1*    Lab Results  Component Value Date   SARSCOV2NAA POSITIVE (A) 07/25/2020   -Patient with CrCl<51ml/min therefore ineligible for Remdesivir or Baricitinib -In addition possible bacterial superinfection initial procalcitonin=Results for ORALEE, RAPAPORT (MRN [id number]) as of 07/28/2020 19:45  Ref. Range 07/25/2020 08:40  Procalcitonin Latest Units: ng/mL 2.35  -Trend  procalcitonin -Cultures NGTD -12/22 spoke with pharmacy all antibiotics have been completed.   -Solu-Medrol 40 mg BID -Vitamins per Covid protocol -DuoNeb QID -Flutter valve -Incentive spirometry  Sepsis pneumonia (Covid 19/suspected CAP) -On admission criteria for sepsis tachycardia, tachypnea, leukocytosis -Cultures obtained, placed on antibiotics.  All physiologic signs of sepsis have resolved.  Antibiotic course completed.  Elevated D-dimer D-dimer is only minimally elevated and its significance is pretty low as this can be elevated in COVID-19 infections as well as ESRD patients.  Since patient is improving, I personally highly doubt any thromboembolism.  Acute on CKD stage IV (baseline Cr 2.4-2.7) -On presentation Cr 6.8 not improving -Transferred to [facility] per nephrology. Now dialysis dependent.  Nephrology managing.  Anion gap metabolic acidosis Resolved.  On dialysis now.  Chronic diastolic CHF -BNP on admission= 259 -Strict in and out  -Daily weight Filed Weights   08/02/20 1416 08/04/20 0415 08/04/20 0830  Weight: 68.1 kg 68.9 kg 69.2 kg  -12/27 discontinue Amiodarone drip -Amlodipine 10 mg daily -12/27 Amiodarone 200 mg daily -Clonidine 0.1 mg TID -12/27 Hydralazine IV PRN -12/22 Metoprolol 12.5 mg BID Hold for MAP<60 -  A. fib with RVR -CHADS2Vasc score is 7 (HTN, age x 2, DM, TIA x2, gender),  -Cardiology consulted -Last seen by cardiology on 12/18 per their note would consider outpatient monitor after systemic illness have resolved.  -Cardiology recommends echocardiogram once rate better controlled. -12/22 Echocardiogram; diastolic dysfunction, pulmonary HTN see echo below.  Continue amiodaroneAnd Lopressor.  We will switch from heparin to Eliquis.  Essential HTN Controlled.  Continue current management.  Pulmonary  HTN -See CHF  Acute metabolic encephalopathy -41/32 resolved answer questions appropriately follows all commands  Hx TIA Transition  from heparin to Eliquis.  No aspirin. -Lipitor 20 mg daily -12/22 LDL = 57: Goal LDL <70   DM type II uncontrolled with Hyperglycemia Controlled.  Continue current regimen.  Mixed Anemia Hemoglobin is stable.  DVT prophylaxis: Heparin drip Code Status: Full Family Communication:  Status is: Inpatient    Dispo: The patient is from:               Anticipated d/c is to: SNF              Anticipated d/c date is: When bed is available and insurance authorization obtained.              Patient currently unstable       Consultants:  Nephrology Cardiology  Procedures/Significant Events:  12/22 nontunneled HD catheter terminating within the superior aspect RIGHT atrium 12/23 echocardiogram limited;LVEF= 55 to 60%.  -Grade I diastolic dysfunction  Right Ventricle: Mldly elevated pulmonary artery systolic pressure. estimated  right ventricular systolic pressure is 44.0 mmHg.    I have personally reviewed and interpreted all radiology studies and my findings are as above.  VENTILATOR SETTINGS: HFNC 12/28 Flow; 4 L/min SPO2; percent   Cultures 12/18 blood RIGHT AC negative 12/18 MRSA by PCR negative 12/18 blood right hand negative final x2    Antimicrobials: Anti-infectives (From admission, onward)   Start     Ordered Stop   08/04/20 1733  vancomycin (VANCOCIN) 1-5 GM/200ML-% IVPB       Note to Pharmacy: Fredric Dine   : cabinet override   08/04/20 1733 08/05/20 0544   08/04/20 1445  vancomycin (VANCOCIN) IVPB 1000 mg/200 mL premix        08/04/20 1356 08/04/20 1911   07/26/20 1000  cefTRIAXone (ROCEPHIN) 1 g in sodium chloride 0.9 % 100 mL IVPB        07/25/20 1138 07/29/20 0911   07/26/20 1000  doxycycline (VIBRAMYCIN) 100 mg in sodium chloride 0.9 % 250 mL IVPB        07/25/20 1138 07/27/20 2335   07/25/20 0500  levofloxacin (LEVAQUIN) IVPB 750 mg        07/25/20 0453 07/25/20 0824        Devices    LINES / TUBES:      Continuous Infusions: .  diltiazem (CARDIZEM) infusion Stopped (07/27/20 0233)     Objective: Vitals:   08/05/20 0001 08/05/20 0407 08/05/20 0733 08/05/20 1240  BP: (!) 155/88 (!) 109/98 (!) 142/64 (!) 121/49  Pulse: 97 75 85 73  Resp: 15 20 20 16   Temp: 97.9 F (36.6 C) 98.3 F (36.8 C) 98.8 F (37.1 C) 98.5 F (36.9 C)  TempSrc: Oral Oral Oral Oral  SpO2: 90% 93% 91% 98%  Weight:      Height:        Intake/Output Summary (Last 24 hours) at 08/05/2020 1400 Last data filed at 08/05/2020 0932 Gross per 24 hour  Intake 100 ml  Output --  Net 100 ml   Filed Weights   08/02/20 1416 08/04/20 0415 08/04/20 0830  Weight: 68.1 kg 68.9 kg 69.2 kg   Physical Exam:  General exam: Appears calm and comfortable  Respiratory system: Diminished breath sounds bilaterally. Respiratory effort normal. Cardiovascular system: S1 & S2 heard, RRR. No JVD, murmurs, rubs, gallops or clicks. No pedal edema. Gastrointestinal system: Abdomen is nondistended, soft and nontender. No organomegaly or  masses felt. Normal bowel sounds heard. Central nervous system: Lethargic but oriented. No focal neurological deficits. Extremities: Symmetric 5 x 5 power. Skin: No rashes, lesions or ulcers.     Data Reviewed: Care during the described time interval was provided by me .  I have reviewed this patient's available data, including medical history, events of note, physical examination, and all test results as part of my evaluation.  CBC: Recent Labs  Lab 07/31/20 0050 08/01/20 0237 08/02/20 0354 08/04/20 0410 08/05/20 1012  WBC 14.3* 16.8* 25.7* 21.9* 16.0*  NEUTROABS 12.9* 15.6* 23.9* 20.6* 14.5*  HGB 8.8* 7.0* 8.1* 8.1* 7.5*  HCT 27.1* 21.3* 25.7* 24.9* 23.9*  MCV 66.6* 67.8* 69.3* 69.7* 70.5*  PLT 353 293 313 285 034   Basic Metabolic Panel: Recent Labs  Lab 08/01/20 0237 08/02/20 0354 08/03/20 0254 08/03/20 1108 08/04/20 0410 08/05/20 1012  NA 138 138  --  137 139 136  K 4.3 4.0  --  3.9 4.1 3.9  CL 100  97*  --  98 99 100  CO2 26 25  --  27 26 23   GLUCOSE 147* 123*  --  313* 91 167*  BUN 42* 59*  --  38* 44* 37*  CREATININE 3.98* 5.21*  --  4.06* 4.84* 3.91*  CALCIUM 8.0* 9.0  --  8.4* 8.8* 8.5*  MG 1.6* 1.9 1.9  --  2.1 2.0  PHOS 3.9 5.3* 4.0  --  4.4 4.9*   GFR: Estimated Creatinine Clearance: 9.9 mL/min (A) (by C-G formula based on SCr of 3.91 mg/dL (H)). Liver Function Tests: Recent Labs  Lab 08/01/20 0237 08/02/20 0354 08/03/20 1108 08/04/20 0410 08/05/20 1012  AST 21 19 19 19 17   ALT 14 18 18 18 18   ALKPHOS 49 72 72 75 72  BILITOT 0.6 0.8 0.2* 0.6 0.7  PROT 5.3* 6.6 5.9* 6.4* 5.5*  ALBUMIN 2.1* 2.6* 2.4* 2.6* 2.4*   No results for input(s): LIPASE, AMYLASE in the last 168 hours. No results for input(s): AMMONIA in the last 168 hours. Coagulation Profile: No results for input(s): INR, PROTIME in the last 168 hours. Cardiac Enzymes: No results for input(s): CKTOTAL, CKMB, CKMBINDEX, TROPONINI in the last 168 hours. BNP (last 3 results) No results for input(s): PROBNP in the last 8760 hours. HbA1C: No results for input(s): HGBA1C in the last 72 hours. CBG: Recent Labs  Lab 08/04/20 1258 08/04/20 1735 08/04/20 2035 08/05/20 0730 08/05/20 1245  GLUCAP 94 151* 157* 160* 120*   Lipid Profile: No results for input(s): CHOL, HDL, LDLCALC, TRIG, CHOLHDL, LDLDIRECT in the last 72 hours. Thyroid Function Tests: No results for input(s): TSH, T4TOTAL, FREET4, T3FREE, THYROIDAB in the last 72 hours. Anemia Panel: Recent Labs    08/04/20 0410 08/05/20 1012  FERRITIN 2,274* 2,029*   Sepsis Labs: Recent Labs  Lab 07/30/20 0500  PROCALCITON 0.52    No results found for this or any previous visit (from the past 240 hour(s)).       Radiology Studies: No results found.      Scheduled Meds: . amiodarone  200 mg Oral Daily  . amLODipine  10 mg Oral Daily  . apixaban  2.5 mg Oral BID  . vitamin C  500 mg Oral Daily  . atorvastatin  20 mg Oral q1800  .  chlorhexidine  15 mL Mouth Rinse BID  . Chlorhexidine Gluconate Cloth  6 each Topical Q0600  . cloNIDine  0.1 mg Oral TID  . [START ON 08/07/2020] darbepoetin (ARANESP)  injection - DIALYSIS  150 mcg Intravenous Q Fri-HD  . haloperidol lactate  2 mg Intravenous Once  . insulin aspart  0-5 Units Subcutaneous QHS  . insulin aspart  0-9 Units Subcutaneous TID WC  . insulin aspart  6 Units Subcutaneous TID WC  . insulin detemir  7 Units Subcutaneous BID  . linagliptin  5 mg Oral Daily  . mouth rinse  15 mL Mouth Rinse q12n4p  . methylPREDNISolone (SOLU-MEDROL) injection  40 mg Intravenous Q12H  . metoprolol tartrate  12.5 mg Oral BID  . sodium chloride flush  10-40 mL Intracatheter Q12H  . sodium chloride flush  3 mL Intravenous Q12H  . zinc sulfate  220 mg Oral Daily   Continuous Infusions: . diltiazem (CARDIZEM) infusion Stopped (07/27/20 0233)     LOS: 11 days    Time spent: 37-minute  Darliss Cheney, MD [facility]  If 7PM-7AM, please contact night-coverage [email or web address] Password [email or web address] 08/05/2020, 2:00 PM

## 2020-08-05 NOTE — Progress Notes (Signed)
Pt in restraints.  CPAP contraindicated

## 2020-08-05 NOTE — Progress Notes (Signed)
Patient has been accepted for outpatient HD treatment at [facility]. She will receive HD in isolation due to COVID positive status on a MWF 3rd shift schedule. She needs to arrive at the clinic at 5:30pm and call when she arrives. She is to stay in her vehicle until staff instructs her to come in.  Unfortunately, [place] clinic is not able to accommodate a Friday evening start due to staffing. Patient will be able to start in the clinic on Monday, 08/11/19. Nephrologist updated. Navigator attempted to call patient's daughter/Ms. Austin, but had to leave HIPAA compliant message requesting a call back as soon as possible.  Alphonzo Cruise, [place] Renal Navigator [phone]

## 2020-08-05 NOTE — Progress Notes (Addendum)
Physical Therapy Treatment Patient Details Name: [name] [name] MRN: [id number] DOB: April 09, 1936 Today's Date: 08/05/2020    History of Present Illness Pt is an 84 y.o. female admitted 07/25/20 with 5 days of cough, lethargy, confusion; found to be hypoxic and tested (+) COVID-19, also with AKI, afib with RVR, sepsis. Pt requiring HD initiation and  with permanent HD catheter placed 08/04/20. PMH includes RA, CKD 4, DM2, HTN, OSA on CPAP, TIA, CHF.    PT Comments    Pt was very lethargic today and required increased assist, cues, assist to initiate, and increased O2 needs.  She ambulated short distance in room but unable to go further due to lethargy and safety.  Pt had procedure yesterday, and per RN lethargic since that time.  Noted family hopeful for return home, but Recommend SNF at this time as pt needing mod A today and limited mobility.     Follow Up Recommendations  SNF     Equipment Recommendations  3in1;  (possible w/c if not improved and family wants to take her home)   Recommendations for Other Services       Precautions / Restrictions Precautions Precautions: Fall Precaution Comments: wrist restraints    Mobility  Bed Mobility Overal bed mobility: Needs Assistance Bed Mobility: Supine to Sit;Sit to Supine     Supine to sit: Min guard Sit to supine: Min assist   General bed mobility comments: Supine to sit: assist for lines/leads and safety; Return to bed: pt trying to crawly into bed, had to assist to turn her around and then help to lay down for safety.  Nurse request to have pt returned to bed due to confused, restraints, and lethargy  Transfers Overall transfer level: Needs assistance Equipment used: 4-wheeled walker Transfers: Sit to/from Stand Sit to Stand: Mod assist         General transfer comment: Assist for balance and to initiate  Ambulation/Gait Ambulation/Gait assistance: Mod assist Gait Distance (Feet): 30 Feet Assistive device:  4-wheeled walker Gait Pattern/deviations: Step-through pattern;Decreased stride length Gait velocity: decreased   General Gait Details: Assist for balance, use of rollator with all turns and avoiding objects, and to initiated.  Ambulated 2 loops in room ,but unable to progress out of room due to unsafe and not following commands.   Stairs             Wheelchair Mobility    Modified Rankin (Stroke Patients Only)       Balance Overall balance assessment: Needs assistance Sitting-balance support: Feet unsupported Sitting balance-Leahy Scale: Fair     Standing balance support: Bilateral upper extremity supported;During functional activity Standing balance-Leahy Scale: Poor Standing balance comment: rollator and min A for standing balance                            Cognition Arousal/Alertness: Lethargic Behavior During Therapy: Impulsive;Flat affect Overall Cognitive Status: No family/caregiver present to determine baseline cognitive functioning Area of Impairment: Attention;Memory;Following commands;Awareness;Problem solving;Orientation;Safety/judgement                 Orientation Level: Disoriented to;Time;Place;Situation Current Attention Level: Focused Memory: Decreased short-term memory;Decreased recall of precautions Following Commands: Follows one step commands inconsistently Safety/Judgement: Decreased awareness of safety;Decreased awareness of deficits Awareness: Intellectual Problem Solving: Slow processing;Decreased initiation;Difficulty sequencing;Requires verbal cues;Requires tactile cues General Comments: Pt very lethargic and only said "yes" one time during session.  Otherwise no communication.  Required cues to wait for line/lead management.  Nursing staff aware      Exercises      General Comments General comments (skin integrity, edema, etc.): Pt on 3 L O2 rest with sats 92%, sats quickly dropped to 81% with sitting requiring  increase to 6 L O2 for activity sats up to 90%, sats 83% on 6L with walking but returned to 90% in 1 minute and able to decrease back to 3 L with sats 90%      Pertinent Vitals/Pain Pain Assessment: No/denies pain    Home Living                      Prior Function            PT Goals (current goals can now be found in the care plan section) Acute Rehab PT Goals Patient Stated Goal: family wanting pt to return home PT Goal Formulation: Patient unable to participate in goal setting Time For Goal Achievement: 08/11/20 Potential to Achieve Goals: Good Progress towards PT goals: Progressing toward goals    Frequency    Min 3X/week      PT Plan Discharge plan needs to be updated    Co-evaluation              AM-PAC PT "6 Clicks" Mobility   Outcome Measure  Help needed turning from your back to your side while in a flat bed without using bedrails?: A Little Help needed moving from lying on your back to sitting on the side of a flat bed without using bedrails?: A Little Help needed moving to and from a bed to a chair (including a wheelchair)?: A Little Help needed standing up from a chair using your arms (e.g., wheelchair or bedside chair)?: A Little Help needed to walk in hospital room?: A Lot Help needed climbing 3-5 steps with a railing? : A Lot 6 Click Score: 16    End of Session Equipment Utilized During Treatment: Gait belt;Oxygen Activity Tolerance: Patient limited by lethargy Patient left: with call bell/phone within reach;in bed;with bed alarm set;with restraints reapplied Nurse Communication: Mobility status PT Visit Diagnosis: Other abnormalities of gait and mobility (R26.89);Muscle weakness (generalized) (M62.81)     Time: 6226-3335 PT Time Calculation (min) (ACUTE ONLY): 24 min  Charges:  $Gait Training: 8-22 mins $Therapeutic Activity: 8-22 mins                     Abran Richard, PT Acute Rehab Services Pager [phone] [facility]  [place] 08/05/2020, 1:51 PM

## 2020-08-05 NOTE — Progress Notes (Addendum)
ANTICOAGULATION CONSULT NOTE - Follow Up Consult  Pharmacy Consult for Heparin>>apixaban Indication: atrial fibrillation  Allergies  Allergen Reactions  . Penicillins Swelling and Rash    Patient Measurements: Height: 5\' 1"  (154.9 cm) Weight: 69.2 kg (152 lb 8.9 oz) IBW/kg (Calculated) : 47.8 Heparin Dosing Weight: 60 kg  Vital Signs: Temp: 98.5 F (36.9 C) (12/29 1240) Temp Source: Oral (12/29 1240) BP: 121/49 (12/29 1240) Pulse Rate: 73 (12/29 1240)  Labs: Recent Labs    08/03/20 1108 08/04/20 0410 08/04/20 1747 08/05/20 1012  HGB  --  8.1*  --  7.5*  HCT  --  24.9*  --  23.9*  PLT  --  285  --  228  HEPARINUNFRC  --  0.13* 0.72* 0.57  CREATININE 4.06* 4.84*  --  3.91*    Estimated Creatinine Clearance: 9.9 mL/min (A) (by C-G formula based on SCr of 3.91 mg/dL (H)).  Assessment 84 y.o. female with atrial fibrillation continues on heparin.   Confirmatory heparin level drawn ~10.5 hrs after heparin infusion was increased to 900 units/hr was 0.72 units/ml, which is just above the goal range for this pt. H/H 8.1/24.9, platelets 285.   Heparin infusion was stopped this afternoon (after heparin level was drawn) for IR procedure (conversion of temp catheter to tunneled HD catheter). Per Dr. Laurence Ferrari, pt had a lot of oozing after procedure, so we should wait 4 hrs before restarting the heparin infusion post-IR procedure.  Heparin came back therapeutic today. Will message MD about plan for [place] term AC. D/w Dr. Doristine Bosworth and we will transition her to apixaban.  Age>80 Wt>60kg Scr>1.5  Goal of Therapy:  Heparin level 0.3-0.7 units/ml Monitor platelets by anticoagulation protocol: Yes   Plan:  Dc heparin Apixaban 2.5mg  PO BID We will monitor peripherally  Onnie Boer, PharmD, BCIDP, AAHIVP, CPP Infectious Disease Pharmacist 08/05/2020 1:10 PM

## 2020-08-05 NOTE — TOC Progression Note (Signed)
Transition of Care [facility]) - Progression Note    Patient Details  Name: Taleen Prosser MRN: [id number] Date of Birth: 02-10-1936  [facility] CM/SW [place], LCSW Phone Number: 08/05/2020, 3:45 PM  Clinical Narrative:    CSW received notification that patient's daughter [name] is now requesting SNF placement. CSW sent referral to [facility] for review as no one else is in network with [employer] in [employer] to transport her to dialysis (Declined by [facility] due to no beds, no response from [facility]). [facility] reports they will need patient to remain at the hospital until next week due to COVID status; they are checking her insurance benefits and can start authorization, which will likely take until next week anyway due to the upcoming holiday.    Expected Discharge Plan: [place] Barriers to Discharge: SNF Pending bed offer,Insurance Authorization  Expected Discharge Plan and Services Expected Discharge Plan: [place] In-house Referral: Clinical Social Work Discharge Planning Services: CM Consult   Living arrangements for the past 2 months: [place]: [place] Date [place]: 08/05/20 Time [place]: [phone] Representative spoke with at [place]: Per Karsten Fells [facility] will be week of Jan 3rd   Social Determinants of Health (SDOH) Interventions    Readmission Risk Interventions No flowsheet data found.

## 2020-08-05 NOTE — Discharge Instructions (Signed)

## 2020-08-05 NOTE — Progress Notes (Signed)
[facility] NEPHROLOGY PROGRESS NOTE  Assessment/ Plan:  # AKIon CKD stage IV, dialysis dependent and now progressed to new ESRD: In setting of sepsis due to covid-19 and CAP. Baseline Scr 2.7-3. Renal US without obstruction but chronicity. No improvement with IVF's and rising BUN/Cr.  Started dialysis on 12/22 after placement of temporary HD catheter.  Since then she has been tolerating dialysis well.   The HD catheter was converted to tunnel on 12/28. The last HD on 12/28 with 2 L UF.  Tolerated well.  Plan for next HD on Friday as she will be MWF schedule as outpatient.  She is accepted at [facility] starting on Monday. Plan for permanent access placement when Covid negative, as outpatient.  #Acute hypoxemic respiratory failure due to covid-19 and community acquired PNA- on IV steroids, but not on remdesivir due to AKI and no baricitinib due to bacterial infection. Still on  O2.  # Sepsis due to covid-19 and CAP- improving.  # Atrial fib with RVR- started on amiodarone per Cardiology.  # Acute metabolic encephalopathy- likely multifactorial with AKI, covid-19, hypoxia, and sepsis.   # Anemia of acute illness and CKD stage IV- follow H/H and iron stores. -  added ESA but hold on iron- hgb stable.  # HTN- stable.  Continue amlodipine.  #Metabolic acidosis: Now managed with dialysis.  Discontinued sodium bicarbonate.  Subjective: Seen and examined.  Tolerated dialysis yesterday after placement of tunneled catheter.  No new event today.  No nausea vomiting chest pain or shortness of breath.  Still requiring some oxygen.  Objective Vital signs in last 24 hours: Vitals:   08/04/20 2013 08/05/20 0001 08/05/20 0407 08/05/20 0733  BP: 110/83 (!) 155/88 (!) 109/98 (!) 142/64  Pulse: 95 97 75 85  Resp: 19 15 20 20   Temp: 98.3 F (36.8 C) 97.9 F (36.6 C) 98.3 F (36.8 C) 98.8 F (37.1 C)  TempSrc: Oral Oral Oral Oral  SpO2: 90% 90% 93% 91%  Weight:       Height:       Weight change: 0.3 kg  Intake/Output Summary (Last 24 hours) at 08/05/2020 1121 Last data filed at 08/05/2020 0932 Gross per 24 hour  Intake 100 ml  Output 2000 ml  Net -1900 ml       Labs: Basic Metabolic Panel: Recent Labs  Lab 08/02/20 0354 08/03/20 0254 08/03/20 1108 08/04/20 0410  NA 138  --  137 139  K 4.0  --  3.9 4.1  CL 97*  --  98 99  CO2 25  --  27 26  GLUCOSE 123*  --  313* 91  BUN 59*  --  38* 44*  CREATININE 5.21*  --  4.06* 4.84*  CALCIUM 9.0  --  8.4* 8.8*  PHOS 5.3* 4.0  --  4.4   Liver Function Tests: Recent Labs  Lab 08/02/20 0354 08/03/20 1108 08/04/20 0410  AST 19 19 19   ALT 18 18 18   ALKPHOS 72 72 75  BILITOT 0.8 0.2* 0.6  PROT 6.6 5.9* 6.4*  ALBUMIN 2.6* 2.4* 2.6*   No results for input(s): LIPASE, AMYLASE in the last 168 hours. No results for input(s): AMMONIA in the last 168 hours. CBC: Recent Labs  Lab 07/30/20 0500 07/31/20 0050 08/01/20 0237 08/02/20 0354 08/04/20 0410  WBC 23.1* 14.3* 16.8* 25.7* 21.9*  NEUTROABS 20.4* 12.9* 15.6* 23.9* 20.6*  HGB 8.5* 8.8* 7.0* 8.1* 8.1*  HCT 26.8* 27.1* 21.3* 25.7* 24.9*  MCV 67.3* 66.6*  67.8* 69.3* 69.7*  PLT 425* 353 293 313 285   Cardiac Enzymes: No results for input(s): CKTOTAL, CKMB, CKMBINDEX, TROPONINI in the last 168 hours. CBG: Recent Labs  Lab 08/04/20 0740 08/04/20 1258 08/04/20 1735 08/04/20 2035 08/05/20 0730  GLUCAP 122* 94 151* 157* 160*    Iron Studies:  Recent Labs    08/04/20 0410  FERRITIN 2,274*   Studies/Results: No results found.  Medications: Infusions: . diltiazem (CARDIZEM) infusion Stopped (07/27/20 0233)  . heparin 850 Units/hr (08/04/20 2255)    Scheduled Medications: . amiodarone  200 mg Oral Daily  . amLODipine  10 mg Oral Daily  . vitamin C  500 mg Oral Daily  . atorvastatin  20 mg Oral q1800  . chlorhexidine  15 mL Mouth Rinse BID  . Chlorhexidine Gluconate Cloth  6 each Topical Q0600  . cloNIDine  0.1 mg  Oral TID  . [START ON 08/07/2020] darbepoetin (ARANESP) injection - DIALYSIS  150 mcg Intravenous Q Fri-HD  . haloperidol lactate  2 mg Intravenous Once  . insulin aspart  0-5 Units Subcutaneous QHS  . insulin aspart  0-9 Units Subcutaneous TID WC  . insulin aspart  6 Units Subcutaneous TID WC  . insulin detemir  7 Units Subcutaneous BID  . linagliptin  5 mg Oral Daily  . mouth rinse  15 mL Mouth Rinse q12n4p  . methylPREDNISolone (SOLU-MEDROL) injection  40 mg Intravenous Q12H  . metoprolol tartrate  12.5 mg Oral BID  . sodium chloride flush  10-40 mL Intracatheter Q12H  . sodium chloride flush  3 mL Intravenous Q12H  . zinc sulfate  220 mg Oral Daily    have reviewed scheduled and prn medications.  Physical Exam: General:NAD, comfortable Heart:RRR, s1s2 nl Lungs: Basal rhonchi, no wheezes Abdomen:soft, Non-tender, non-distended Extremities:No edema Dialysis Access: Right IJ tunnel catheter placed by IR on 12/28, site clean.  Dillian Feig Prasad Peirce Deveney 08/05/2020,11:21 AM  LOS: 11 days  Pager: [phone]

## 2020-08-05 NOTE — NC FL2 (Signed)
[place] LEVEL OF CARE SCREENING TOOL     IDENTIFICATION  Patient Name: [name] [name] Birthdate: March 06, 1936 Sex: female Admission Date (Current Location): 07/25/2020  [facility] and [place] Number:  Herbalist and Address:  [facility] [place] [address], [place], [place] [address]      Provider Number: [id number]  Attending Physician Name and Address:  Darliss Cheney, MD  Relative Name and Phone Number:  Aris Georgia, daughter, [phone]    Current Level of Care: Hospital Recommended Level of Care: [place] Prior Approval Number:    Date Approved/Denied:   PASRR Number: [id number]  Discharge Plan: SNF    Current Diagnoses: Patient Active Problem List   Diagnosis Date Noted  . Acute hypoxemic respiratory failure due to COVID-19 ([place]) 07/25/2020  . CAP (community acquired pneumonia) 07/25/2020  . CKD (chronic kidney disease), stage IV ([place]) 07/25/2020  . AKI (acute kidney injury) ([place]) 07/25/2020  . Sepsis ([place]) 07/25/2020  . Atrial fibrillation with RVR ([place]) 07/25/2020  . 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 Chronic idiopathic constipation 09/30/2018  . Chronic diastolic HF (heart failure) ([place]) 05/26/2015  . Type 2 diabetes mellitus with nephropathy ([place]) 05/26/2015  . CHF (congestive heart failure) ([place]) 05/25/2015  . Anasarca 05/25/2015  . DM (diabetes mellitus), type 2 with renal complications ([place]) 42/70/6237  . Chronic anemia 05/25/2015  . Hypocalcemia   . Encephalopathy, hypertensive 06/11/2014  . Essential hypertension 06/11/2014  . Type 2 diabetes mellitus without complication ([place]) 62/83/1517  . HLD (hyperlipidemia) 06/11/2014  . OSA (obstructive sleep apnea) 03/06/2014  . Chronic renal disease, stage III ([place]) 01/13/2014  . TIA (transient ischemic attack) 01/11/2014  . Hypertensive urgency 01/11/2014  . Diabetes mellitus ([place]) 01/11/2014     Orientation RESPIRATION BLADDER Height & Weight     Self,Place  O2 (4L Nasal cannula) Incontinent Weight: 152 lb 8.9 oz (69.2 kg) Height:  5\' 1"  (154.9 cm)  BEHAVIORAL SYMPTOMS/MOOD NEUROLOGICAL BOWEL NUTRITION STATUS      Continent Diet (Please see DC Summary)  AMBULATORY STATUS COMMUNICATION OF NEEDS Skin   Limited Assist Verbally Normal                       Personal Care Assistance Level of Assistance  Bathing,Feeding,Dressing Bathing Assistance: Limited assistance Feeding assistance: Limited assistance Dressing Assistance: Limited assistance     Functional Limitations Info  Hearing   Hearing Info: Impaired      SPECIAL CARE FACTORS FREQUENCY  PT (By licensed PT),OT (By licensed OT)     PT Frequency: 5x/week OT Frequency: 5x/week            Contractures Contractures Info: Not present    Additional Factors Info  Code Status,Allergies,Isolation Precautions,Insulin Sliding Scale Code Status Info: Full Allergies Info: Penicillins   Insulin Sliding Scale Info: See DC Summary Isolation Precautions Info: COVID+ on 07/25/20     Current Medications (08/05/2020):  This is the current hospital active medication list Current Facility-Administered Medications  Medication Dose Route Frequency Provider Last Rate Last Admin  . acetaminophen (TYLENOL) tablet 650 mg  650 mg Oral Q6H PRN Harold Hedge, MD      . amiodarone (PACERONE) tablet 200 mg  200 mg Oral Daily Allie Bossier, MD   200 mg at 08/05/20 0947  . amLODipine (NORVASC) tablet 10 mg  10 mg Oral Daily Caren [place]ins, MD   10 mg at 08/05/20  0938  . apixaban (ELIQUIS) tablet 2.5 mg  2.5 mg Oral BID Pham, Minh Q, RPH-CPP   2.5 mg at 08/05/20 1512  . ascorbic acid (VITAMIN C) tablet 500 mg  500 mg Oral Daily Allie Bossier, MD   500 mg at 08/05/20 0947  . atorvastatin (LIPITOR) tablet 20 mg  20 mg Oral q1800 Harold Hedge, MD   20 mg at 08/04/20 1722  . chlorhexidine (PERIDEX) 0.12 % solution 15 mL   15 mL Mouth Rinse BID Harold Hedge, MD   15 mL at 08/04/20 2209  . Chlorhexidine Gluconate Cloth 2 % PADS 6 each  6 each Topical Q0600 Rosita Fire, MD   6 each at 08/05/20 [phone]  . cloNIDine (CATAPRES) tablet 0.1 mg  0.1 mg Oral TID Roney Jaffe, MD   0.1 mg at 08/05/20 1512  . [START ON 08/07/2020] Darbepoetin Alfa (ARANESP) injection 150 mcg  150 mcg Intravenous Q Fri-HD Corliss Parish, MD   150 mcg at 07/31/20 1524  . dextrose 50 % solution 0-50 mL  0-50 mL Intravenous PRN Fatima Blank, MD   35 mL at 07/30/20 0443  . guaiFENesin-dextromethorphan (ROBITUSSIN DM) 100-10 MG/5ML syrup 10 mL  10 mL Oral Q4H PRN Harold Hedge, MD   10 mL at 07/29/20 9371  . haloperidol lactate (HALDOL) injection 2 mg  2 mg Intravenous Once Zierle-Ghosh, Asia B, DO      . hydrALAZINE (APRESOLINE) injection 10 mg  10 mg Intravenous Q8H PRN Allie Bossier, MD   10 mg at 08/04/20 1722  . insulin aspart (novoLOG) injection 0-5 Units  0-5 Units Subcutaneous QHS Allie Bossier, MD      . insulin aspart (novoLOG) injection 0-9 Units  0-9 Units Subcutaneous TID WC Allie Bossier, MD   2 Units at 08/05/20 [phone]  . insulin aspart (novoLOG) injection 6 Units  6 Units Subcutaneous TID WC Allie Bossier, MD   6 Units at 08/05/20 [phone]  . insulin detemir (LEVEMIR) injection 7 Units  7 Units Subcutaneous BID Allie Bossier, MD   7 Units at 08/05/20 [phone]  . linagliptin (TRADJENTA) tablet 5 mg  5 mg Oral Daily Harold Hedge, MD   5 mg at 08/05/20 0947  . MEDLINE mouth rinse  15 mL Mouth Rinse q12n4p Harold Hedge, MD   15 mL at 08/04/20 1308  . methylPREDNISolone sodium succinate (SOLU-MEDROL) 40 mg/mL injection 40 mg  40 mg Intravenous Q12H Patrecia Pour, MD   40 mg at 08/05/20 0947  . metoprolol tartrate (LOPRESSOR) tablet 12.5 mg  12.5 mg Oral BID Allie Bossier, MD   12.5 mg at 08/05/20 0947  . sodium chloride flush (NS) 0.9 % injection 10-40 mL  10-40 mL Intracatheter Q12H Allie Bossier, MD    10 mL at 08/04/20 2216  . sodium chloride flush (NS) 0.9 % injection 10-40 mL  10-40 mL Intracatheter PRN Allie Bossier, MD      . sodium chloride flush (NS) 0.9 % injection 3 mL  3 mL Intravenous Q12H Harold Hedge, MD   3 mL at 08/05/20 0948  . zinc sulfate capsule 220 mg  220 mg Oral Daily Allie Bossier, MD   220 mg at 08/05/20 5102     Discharge Medications: Please see discharge summary for a list of discharge medications.  Relevant Imaging Results:  Relevant Lab Results:   Additional Information SSN: [id number]. Unvaccinated. Requires transportation to dialysis  at [facility] on MWF at 5:30pm until she is 21 days past her first COVID positive test. Then clinic will decide her new seat time.  Benard Halsted, LCSW

## 2020-08-06 DIAGNOSIS — U071 COVID-19: Secondary | ICD-10-CM | POA: Diagnosis not present

## 2020-08-06 DIAGNOSIS — J9601 Acute respiratory failure with hypoxia: Secondary | ICD-10-CM | POA: Diagnosis not present

## 2020-08-06 LAB — GLUCOSE, CAPILLARY
Glucose-Capillary: 176 mg/dL — ABNORMAL HIGH (ref 70–99)
Glucose-Capillary: 58 mg/dL — ABNORMAL LOW (ref 70–99)
Glucose-Capillary: 141 mg/dL — ABNORMAL HIGH (ref 70–99)
Glucose-Capillary: 144 mg/dL — ABNORMAL HIGH (ref 70–99)
Glucose-Capillary: 222 mg/dL — ABNORMAL HIGH (ref 70–99)
Glucose-Capillary: 189 mg/dL — ABNORMAL HIGH (ref 70–99)
Glucose-Capillary: 120 mg/dL — ABNORMAL HIGH (ref 70–99)

## 2020-08-06 LAB — CBC WITH DIFFERENTIAL/PLATELET
Abs Immature Granulocytes: 0.2 10*3/uL — ABNORMAL HIGH (ref 0.00–0.07)
Basophils Absolute: 0 10*3/uL (ref 0.0–0.1)
Basophils Relative: 0 %
Eosinophils Absolute: 0 10*3/uL (ref 0.0–0.5)
Eosinophils Relative: 0 %
HCT: 22.2 % — ABNORMAL LOW (ref 36.0–46.0)
Hemoglobin: 7 g/dL — ABNORMAL LOW (ref 12.0–15.0)
Immature Granulocytes: 1 %
Lymphocytes Relative: 2 %
Lymphs Abs: 0.4 10*3/uL — ABNORMAL LOW (ref 0.7–4.0)
MCH: 22.4 pg — ABNORMAL LOW (ref 26.0–34.0)
MCHC: 31.5 g/dL (ref 30.0–36.0)
MCV: 71.2 fL — ABNORMAL LOW (ref 80.0–100.0)
Monocytes Absolute: 0.6 10*3/uL (ref 0.1–1.0)
Monocytes Relative: 4 %
Neutro Abs: 14.7 10*3/uL — ABNORMAL HIGH (ref 1.7–7.7)
Neutrophils Relative %: 93 %
Platelets: 226 10*3/uL (ref 150–400)
RBC: 3.12 MIL/uL — ABNORMAL LOW (ref 3.87–5.11)
RDW: 18 % — ABNORMAL HIGH (ref 11.5–15.5)
WBC: 15.8 10*3/uL — ABNORMAL HIGH (ref 4.0–10.5)
nRBC: 1.7 % — ABNORMAL HIGH (ref 0.0–0.2)

## 2020-08-06 LAB — COMPREHENSIVE METABOLIC PANEL
ALT: 14 U/L (ref 0–44)
AST: 13 U/L — ABNORMAL LOW (ref 15–41)
Albumin: 2.2 g/dL — ABNORMAL LOW (ref 3.5–5.0)
Alkaline Phosphatase: 63 U/L (ref 38–126)
Anion gap: 11 (ref 5–15)
BUN: 46 mg/dL — ABNORMAL HIGH (ref 8–23)
CO2: 26 mmol/L (ref 22–32)
Calcium: 8.6 mg/dL — ABNORMAL LOW (ref 8.9–10.3)
Chloride: 103 mmol/L (ref 98–111)
Creatinine, Ser: 4.65 mg/dL — ABNORMAL HIGH (ref 0.44–1.00)
GFR, Estimated: 9 mL/min — ABNORMAL LOW (ref >60)
Glucose, Bld: 48 mg/dL — ABNORMAL LOW (ref 70–99)
Potassium: 3.9 mmol/L (ref 3.5–5.1)
Sodium: 140 mmol/L (ref 135–145)
Total Bilirubin: 0.6 mg/dL (ref 0.3–1.2)
Total Protein: 5.1 g/dL — ABNORMAL LOW (ref 6.5–8.1)

## 2020-08-06 LAB — LACTATE DEHYDROGENASE: LDH: 240 U/L — ABNORMAL HIGH (ref 98–192)

## 2020-08-06 LAB — C-REACTIVE PROTEIN: CRP: 1.5 mg/dL — ABNORMAL HIGH (ref <1.0)

## 2020-08-06 LAB — MAGNESIUM: Magnesium: 2.1 mg/dL (ref 1.7–2.4)

## 2020-08-06 LAB — PHOSPHORUS: Phosphorus: 4.9 mg/dL — ABNORMAL HIGH (ref 2.5–4.6)

## 2020-08-06 LAB — D-DIMER, QUANTITATIVE: D-Dimer, Quant: 1.7 ug/mL-FEU — ABNORMAL HIGH (ref 0.00–0.50)

## 2020-08-06 LAB — FERRITIN: Ferritin: 1758 ng/mL — ABNORMAL HIGH (ref 11–307)

## 2020-08-06 MED ORDER — CHLORHEXIDINE GLUCONATE CLOTH 2 % EX PADS
6.0000 | MEDICATED_PAD | Freq: Every day | CUTANEOUS | Status: DC
  Administered 2020-08-06 – 2020-08-14 (×9): 6 via TOPICAL

## 2020-08-06 NOTE — Progress Notes (Signed)
Renal Navigator received call back from patient's daughter from message left earlier today. Navigator explained outpatient HD clinic information and initial week of treatments in isolation on 3rd shift. Patient's daughter understood, but stated that she had spoken with Attending/Dr. Sherral Hammers yesterday, who told her that her mother really needs short term rehab because of her debility at this time and that she has now changed her mind and wants her mother to go to SNF. Navigator asked if she has discussed this with the [place]/CSW and she states she has not yet. She asked if Navigator would let CSW know. Navigator agreed and informed patient's daughter that we would need to try to coordinate a SNF who would accept a COVID positive patient and an HD clinic who would also in the same location and that this would mean that her mother is not ready for discharge. She stated understanding.  Navigator informed CSW and will work closely to attempt to make a plan that will accommodate patient's needs.  Navigator will follow closely.  Alphonzo Cruise, [place] Renal Navigator [phone]

## 2020-08-06 NOTE — Progress Notes (Signed)
Renal Navigator received call from [facility] who states patient's negative shift seat schedule (once out of isolation) will be MWF 11:30am. She needs to arrive to her appointments at 11:10am.  Navigator continuing to follow for disposition and discharge timing.  Alphonzo Cruise, [place] Renal Navigator [phone]

## 2020-08-06 NOTE — Progress Notes (Signed)
[facility] NEPHROLOG[facility] PROGRESS NOTE  Assessment/ Plan:  # AKIon CKD stage IV, dialysis dependent and now progressed to new ESRD: In setting of sepsis due to covid-19 and CAP. Baseline Scr 2.7-3. Renal US without obstruction but chronicity. No improvement with IVF's and rising BUN/Cr.  Started dialysis on 12/22 after placement of temporary HD catheter.  Since then she has been tolerating dialysis well.   The HD catheter was converted to tunnel on 12/28. The last HD on 12/28 with 2 L UF.  Tolerated well.  Plan for next HD tomorrow as she will be MWF schedule as outpatient.  She is accepted at [facility] starting on Monday. Plan for permanent access placement when Covid negative, as outpatient.  #Acute hypoxemic respiratory failure due to covid-19 and community acquired PNA- on IV steroids, but not on remdesivir due to AKI and no baricitinib due to bacterial infection. Still on  O2.  # Sepsis due to covid-19 and CAP- improving.  # Atrial fib with RVR- started on amiodarone per Cardiology.  # Acute metabolic encephalopathy- likely multifactorial with AKI, covid-19, hypoxia, and sepsis.   # Anemia of acute illness and CKD stage IV- follow H/H and iron stores. -  added ESA but hold on iron- hgb stable.  Transfuse as needed.  # HTN- stable.  Continue amlodipine.  #Metabolic acidosis: Now managed with dialysis.  Discontinued sodium bicarbonate.  Subjective: Seen and examined.  No new event.  No urine output.  Denies nausea vomiting chest pain shortness of breath. Objective Vital signs in last 24 hours: Vitals:   08/05/20 1954 08/05/20 2356 08/06/20 0451 08/06/20 0742  BP: (!) 127/52 (!) 111/49 (!) 122/54 (!) 136/57  Pulse: 85 68 73 69  Resp: (!) 21 16 16 18   Temp: 98.7 F (37.1 C) 98.7 F (37.1 C) 98.7 F (37.1 C) 98.2 F (36.8 C)  TempSrc: Oral Oral Oral Oral  SpO2: 91% 95% 95% 96%  Weight: 68.7 kg 68.9 kg 68.9 kg   Height:       Weight change: -0.5  kg No intake or output data in the 24 hours ending 08/06/20 1039     Labs: Basic Metabolic Panel: Recent Labs  Lab 08/04/20 0410 08/05/20 1012 08/06/20 0130  NA 139 136 140  K 4.1 3.9 3.9  CL 99 100 103  CO2 26 23 26   GLUCOSE 91 167* 48*  BUN 44* 37* 46*  CREATININE 4.84* 3.91* 4.65*  CALCIUM 8.8* 8.5* 8.6*  PHOS 4.4 4.9* 4.9*   Liver Function Tests: Recent Labs  Lab 08/04/20 0410 08/05/20 1012 08/06/20 0130  AST 19 17 13*  ALT 18 18 14   ALKPHOS 75 72 63  BILITOT 0.6 0.7 0.6  PROT 6.4* 5.5* 5.1*  ALBUMIN 2.6* 2.4* 2.2*   No results for input(s): LIPASE, AMYLASE in the last 168 hours. No results for input(s): AMMONIA in the last 168 hours. CBC: Recent Labs  Lab 08/01/20 0237 08/02/20 0354 08/04/20 0410 08/05/20 1012 08/06/20 0130  WBC 16.8* 25.7* 21.9* 16.0* 15.8*  NEUTROABS 15.6* 23.9* 20.6* 14.5* 14.7*  HGB 7.0* 8.1* 8.1* 7.5* 7.0*  HCT 21.3* 25.7* 24.9* 23.9* 22.2*  MCV 67.8* 69.3* 69.7* 70.5* 71.2*  PLT 293 313 285 228 226   Cardiac Enzymes: No results for input(s): CKTOTAL, CKMB, CKMBINDEX, TROPONINI in the last 168 hours. CBG: Recent Labs  Lab 08/05/20 2051 08/05/20 2123 08/06/20 0433 08/06/20 0522 08/06/20 0745  GLUCAP 60* 145* 58* 176* 141*    Iron Studies:  Recent Labs    08/06/20 0130  FERRITIN 1,758*   Studies/Results: No results found.  Medications: Infusions:   Scheduled Medications: . amiodarone  200 mg Oral Daily  . amLODipine  10 mg Oral Daily  . apixaban  2.5 mg Oral BID  . vitamin C  500 mg Oral Daily  . atorvastatin  20 mg Oral q1800  . chlorhexidine  15 mL Mouth Rinse BID  . Chlorhexidine Gluconate Cloth  6 each Topical Q0600  . cloNIDine  0.1 mg Oral TID  . [START ON 08/07/2020] darbepoetin (ARANESP) injection - DIALYSIS  150 mcg Intravenous Q Fri-HD  . haloperidol lactate  2 mg Intravenous Once  . insulin aspart  0-5 Units Subcutaneous QHS  . insulin aspart  0-9 Units Subcutaneous TID WC  . insulin aspart   6 Units Subcutaneous TID WC  . insulin detemir  7 Units Subcutaneous BID  . linagliptin  5 mg Oral Daily  . mouth rinse  15 mL Mouth Rinse q12n4p  . methylPREDNISolone (SOLU-MEDROL) injection  40 mg Intravenous Q12H  . metoprolol tartrate  12.5 mg Oral BID  . sodium chloride flush  10-40 mL Intracatheter Q12H  . sodium chloride flush  3 mL Intravenous Q12H  . zinc sulfate  220 mg Oral Daily    have reviewed scheduled and prn medications.  Physical Exam: General:NAD, lying on bed comfortable Heart:RRR, s1s2 nl Lungs: Clear bilateral, no wheezes Abdomen:soft, Non-tender, non-distended Extremities:No LE edema Dialysis Access: Right IJ tunnel catheter placed by IR on 12/28, site clean.  Julieanna Geraci Prasad Chanler Schreiter 08/06/2020,10:39 AM  LOS: 12 days  Pager: [phone]

## 2020-08-06 NOTE — TOC Progression Note (Signed)
Transition of Care [facility]) - Progression Note    Patient Details  Name: [name] [name] MRN: [id number] Date of Birth: [birth year]  Transition of [facility]) CM/SW [place], LCSW Phone Number: 08/06/2020, 2:56 PM  Clinical Narrative:    CSW updated patient's daughter, [name] [name], regarding discharge plan to [place]. Per [facility] requesting updated notes, which CSW faxed to [phone], [phone]. CSW reiterated patient's COVID shift dialysis time at 5:30pm MWF at [facility] until 1/10 to [place].    Expected Discharge Plan: [place] Barriers to Discharge: SNF Pending bed offer,Insurance Authorization  Expected Discharge Plan and Services Expected Discharge Plan: [place] In-house Referral: Clinical Social Work Discharge Planning Services: CM Consult   Living arrangements for the past 2 months: [place]: [place] Date [place]: 08/05/20 Time [place]: [phone] Representative spoke with at [place]: Per Karsten Fells [facility] will be week of Jan 3rd   Social Determinants of Health (SDOH) Interventions    Readmission Risk Interventions No flowsheet data found.

## 2020-08-06 NOTE — Progress Notes (Addendum)
Occupational Therapy Treatment Patient Details Name: [name] [name] MRN: [id number] DOB: Dec 08, 1935 Today's Date: 08/06/2020    History of present illness Pt is an 84 y.o. female admitted 07/25/20 with 5 days of cough, lethargy, confusion; found to be hypoxic and tested (+) COVID-19, also with AKI, afib with RVR, sepsis. Pt requiring HD initiation and  with permanent HD catheter placed 08/04/20. PMH includes RA, CKD 4, DM2, HTN, OSA on CPAP, TIA, CHF.   OT comments  Pt seen for OT follow up session with focus on ADL progression. Pt presenting with decreased alertness this session. She was on 2L [place] on arrival with sats at 86%. Pt sat EOB with min A, and O2 sats dropped as low as 79% on 2L [place]. Pt ultimately needed 8L [place] and max cues to breath in through nose and out through mouth while seated EOB to recover into 90s. Pt then was able to mobilize to sink with min A +2. She required seated rest breaks and mod A to correctly sequence through typical routine ADL tasks. Pt was on 6L [place] to maintain SpO2 into low 90%. RN was informed. Pt left ith PT to finish remainder of session. Family is now agreeable to SNF. Will continue to follow.   Pt SpO2 dropped as low as 79% on 2L [place] and ultimately required 8L [place] to recover into 90s. 6L  was needed to maintain SpO2 throughout activity.   Follow Up Recommendations  SNF;Supervision/Assistance - 24 hour    Equipment Recommendations  3 in 1 bedside commode    Recommendations for Other Services      Precautions / Restrictions Precautions Precautions: Fall Precaution Comments: wrist restraints Restrictions Weight Bearing Restrictions: No       Mobility Bed Mobility Overal bed mobility: Needs Assistance Bed Mobility: Supine to Sit     Supine to sit: Min assist Sit to supine: Min assist   General bed mobility comments: light min A to come up to sitting and to initate approprite movements. Pt turning wrong way in bed and had difficulty  following commands  Transfers Overall transfer level: Needs assistance Equipment used: 1 person hand held assist Transfers: Sit to/from Stand Sit to Stand: Min assist;+2 physical assistance;+2 safety/equipment         General transfer comment: assist to rise and steady and for initiation of task    Balance Overall balance assessment: Needs assistance Sitting-balance support: Feet unsupported Sitting balance-Leahy Scale: Fair Sitting balance - Comments: Prolonged sitting EOB without feet support (due to pt's short height) without issue   Standing balance support: Bilateral upper extremity supported;During functional activity Standing balance-Leahy Scale: Poor Standing balance comment: reliant on BUE external support                           ADL either performed or assessed with clinical judgement   ADL Overall ADL's : Needs assistance/impaired     Grooming: Moderate assistance;Standing;Cueing for sequencing;Cueing for safety;Sitting Grooming Details (indicate cue type and reason): pt able to begin task standing, fatigued quickly and sat down (did not check to see if there was a chair behind her even tho OT had placed one). Required mod A- step by step cueing to complete task                 Toilet Transfer: Minimal assistance;Ambulation;RW;Cueing for safety;Cueing for sequencing Toilet Transfer Details (indicate cue type and reason): step by step navigational cues  Functional mobility during ADLs: Minimal assistance;Moderate assistance;Cueing for safety;Cueing for sequencing General ADL Comments: less alert from previous session, noted increased O2 demands     Vision   Vision Assessment?: Vision impaired- to be further tested in functional context Additional Comments: Pt having difficulty locating ADL items on countertop. Difficulty to assess between cognition and vision   Perception     Praxis      Cognition Arousal/Alertness:  Lethargic Behavior During Therapy: Impulsive;Flat affect Overall Cognitive Status: No family/caregiver present to determine baseline cognitive functioning Area of Impairment: Attention;Memory;Following commands;Awareness;Problem solving;Orientation;Safety/judgement                 Orientation Level: Disoriented to;Time;Place;Situation (was oriented to date- pt could not recall after 2 mins) Current Attention Level: Focused Memory: Decreased short-term memory;Decreased recall of precautions Following Commands: Follows one step commands inconsistently;Follows one step commands with increased time Safety/Judgement: Decreased awareness of safety;Decreased awareness of deficits Awareness: Intellectual Problem Solving: Slow processing;Decreased initiation;Difficulty sequencing;Requires verbal cues;Requires tactile cues General Comments: Decreased alertness from previous sessions. Requires step by step sequencing cues to complete simple ADL tasks. Very poor safety awarenes- was standing at sink and immediately sat down without checking if chair was behind her (OT had placed one)        Exercises     Shoulder Instructions       General Comments      Pertinent Vitals/ Pain       Pain Assessment: No/denies pain Faces Pain Scale: No hurt  Home Living                                          Prior Functioning/Environment              Frequency  Min 2X/week        Progress Toward Goals  OT Goals(current goals can now be found in the care plan section)  Progress towards OT goals: Progressing toward goals  Acute Rehab OT Goals Patient Stated Goal: family now agreeable to SNF per chart OT Goal Formulation: Patient unable to participate in goal setting Time For Goal Achievement: 08/12/20 Potential to Achieve Goals: Good  Plan Discharge plan needs to be updated    Co-evaluation                 AM-PAC OT "6 Clicks" Daily Activity     Outcome  Measure   Help from another person eating meals?: A Little Help from another person taking care of personal grooming?: A Lot Help from another person toileting, which includes using toliet, bedpan, or urinal?: A Lot Help from another person bathing (including washing, rinsing, drying)?: A Lot Help from another person to put on and taking off regular upper body clothing?: A Lot Help from another person to put on and taking off regular lower body clothing?: A Lot 6 Click Score: 13    End of Session Equipment Utilized During Treatment: Gait belt;Rolling walker;Oxygen  OT Visit Diagnosis: Unsteadiness on feet (R26.81);Other abnormalities of gait and mobility (R26.89);Muscle weakness (generalized) (M62.81);Other symptoms and signs involving cognitive function   Activity Tolerance Patient tolerated treatment well   Patient Left Other (comment) (sitting in recliner with PT finishing session)   Nurse Communication Mobility status        Time: 1610-9604 OT Time Calculation (min): 29 min  Charges: OT General Charges $OT Visit: 1 Visit OT Treatments $Self  Care/Home Management : 8-22 mins  Zenovia Jarred, MSOT, OTR/L Acute Rehabilitation Services [facility] Office Number: [phone] Pager: [phone]  Zenovia Jarred 08/06/2020, 12:25 PM

## 2020-08-06 NOTE — Progress Notes (Signed)
PROGRESS NOTE    [name] [name]  QMG:[id number] DOB: [birth year] DOA: 07/25/2020 PCP: [name], MD     Brief Narrative:  [name] [name] is an 84 year old female with history of RA, CKD 4, DM 2, HTN, HLD, OSA on CPAP, TIA, chronic diastolic CHF.   To the hospital with 5 days of cough, lethargy, confusion. Was found to be hypoxic, tested positive for Covid and was admitted to the hospital. She was also found to have acute kidney injury. She was also found to have A. fib with RVR. Cardiology and nephrology were consulted on admission. Renal function has not improved and the patient is transferred to [facility] in anticipation of dialysis initiation.   Subjective: Patient seen and examined.  No complaints.  Assessment & Plan: Covid vaccination; unvaccinated   Principal Problem:   Acute hypoxemic respiratory failure due to COVID-19 [facility]) Active Problems:   Essential hypertension   HLD (hyperlipidemia)   DM (diabetes mellitus), type 2 with renal complications (HCC)   Chronic diastolic HF (heart failure) ([place])   CAP (community acquired pneumonia)   CKD (chronic kidney disease), stage IV (HCC)   AKI (acute kidney injury) ([place])   Sepsis ([place])   Atrial fibrillation with RVR ([place])  Acute respiratory failure with hypoxia/Covid pneumonia/suspected bacterial superinfection COVID-19 Labs  Recent Labs    08/04/20 0410 08/05/20 1012 08/06/20 0130  DDIMER 2.11* 1.97* 1.70*  FERRITIN 2,274* 2,029* 1,758*  LDH 360* 316* 240*  CRP 3.4* 2.1* 1.5*    Lab Results  Component Value Date   SARSCOV2NAA POSITIVE (A) 07/25/2020   -Patient with CrCl<26ml/min therefore ineligible for Remdesivir or Baricitinib -In addition possible bacterial superinfection initial procalcitonin=Results for [name] [name] (MRN [id number]) as of 07/28/2020 19:45  Ref. Range 07/25/2020 08:40  Procalcitonin Latest Units: ng/mL 2.35  -Trend procalcitonin -Cultures NGTD -12/22 spoke with  pharmacy all antibiotics have been completed.   -Solu-Medrol 40 mg BID -Vitamins per Covid protocol -DuoNeb QID -Flutter valve -Incentive spirometry  Sepsis pneumonia (Covid 19/suspected CAP) -On admission criteria for sepsis tachycardia, tachypnea, leukocytosis -Cultures obtained, placed on antibiotics.  All physiologic signs of sepsis have resolved.  Antibiotic course completed.  Elevated D-dimer D-dimer is only minimally elevated and its significance is pretty low as this can be elevated in COVID-19 infections as well as ESRD patients.  Since patient is improving, I personally highly doubt any thromboembolism.  Acute on CKD stage IV (baseline Cr 2.4-2.7) -On presentation Cr 6.8 not improving -Transferred to [facility] per nephrology. Now dialysis dependent.  Nephrology managing.  Anion gap metabolic acidosis Resolved.  On dialysis now.  Chronic diastolic CHF -BNP on admission= 259 -Strict in and out  -Daily weight Filed Weights   08/05/20 1954 08/05/20 2356 08/06/20 0451  Weight: 68.7 kg 68.9 kg 68.9 kg  -  A. fib with RVR -CHADS2Vasc score is 7 (HTN, age x 2, DM, TIA x2, gender),  -Cardiology consulted -Last seen by cardiology on 12/18 per their note would consider outpatient monitor after systemic illness have resolved.  -Cardiology recommends echocardiogram once rate better controlled. -12/22 Echocardiogram; diastolic dysfunction, pulmonary HTN see echo below.  Continue amiodaroneAnd Lopressor.  We will switch from heparin to Eliquis.  Essential HTN Controlled.  Continue current management.  Pulmonary HTN -See CHF  Acute metabolic encephalopathy Resolved.  She is sleepy but easily arousable.  She is oriented but needs a little cueing.  Hx TIA Transitioned from heparin to Eliquis.  No aspirin. -Lipitor 20 mg daily -12/22 LDL = 57:  Goal LDL <70   DM type II uncontrolled with Hyperglycemia Controlled.  Continue current regimen.  Mixed Anemia Hemoglobin is  stable.  DVT prophylaxis: Eliquis Code Status: Full Family Communication:  Status is: Inpatient    Dispo: The patient is from:               Anticipated d/c is to: SNF              Anticipated d/c date is: When bed is available and insurance authorization obtained.              Patient currently unstable       Consultants:  Nephrology Cardiology  Procedures/Significant Events:  12/22 nontunneled HD catheter terminating within the superior aspect RIGHT atrium 12/23 echocardiogram limited;LVEF= 55 to 60%.  -Grade I diastolic dysfunction  Right Ventricle: Mldly elevated pulmonary artery systolic pressure. estimated  right ventricular systolic pressure is 53.2 mmHg.    I have personally reviewed and interpreted all radiology studies and my findings are as above.  VENTILATOR SETTINGS: HFNC 12/28 Flow; 4 L/min SPO2; percent   Cultures 12/18 blood RIGHT AC negative 12/18 MRSA by PCR negative 12/18 blood right hand negative final x2    Antimicrobials: Anti-infectives (From admission, onward)   Start     Ordered Stop   08/04/20 1733  vancomycin (VANCOCIN) 1-5 GM/200ML-% IVPB       Note to Pharmacy: Fredric Dine   : cabinet override   08/04/20 1733 08/05/20 0544   08/04/20 1445  vancomycin (VANCOCIN) IVPB 1000 mg/200 mL premix        08/04/20 1356 08/04/20 1911   07/26/20 1000  cefTRIAXone (ROCEPHIN) 1 g in sodium chloride 0.9 % 100 mL IVPB        07/25/20 1138 07/29/20 0911   07/26/20 1000  doxycycline (VIBRAMYCIN) 100 mg in sodium chloride 0.9 % 250 mL IVPB        07/25/20 1138 07/27/20 2335   07/25/20 0500  levofloxacin (LEVAQUIN) IVPB 750 mg        07/25/20 0453 07/25/20 0824        Devices    LINES / TUBES:      Continuous Infusions:    Objective: Vitals:   08/06/20 1033 08/06/20 1038 08/06/20 1100 08/06/20 1141  BP:    127/66  Pulse:    82  Resp:    20  Temp:    97.9 F (36.6 C)  TempSrc:    Oral  SpO2: (!) 79% 90% 94% 90%  Weight:       Height:       No intake or output data in the 24 hours ending 08/06/20 1312 Filed Weights   08/05/20 1954 08/05/20 2356 08/06/20 0451  Weight: 68.7 kg 68.9 kg 68.9 kg   Physical Exam:  General exam: Appears calm and comfortable but a little sleepy but easily arousable Respiratory system: Diminished breath sounds due to poor inspiratory effort Cardiovascular system: S1 & S2 heard, RRR. No JVD, murmurs, rubs, gallops or clicks. No pedal edema. Gastrointestinal system: Abdomen is nondistended, soft and nontender. No organomegaly or masses felt. Normal bowel sounds heard. Central nervous system: Alert and oriented. No focal neurological deficits. Extremities: Symmetric 5 x 5 power. Skin: No rashes, lesions or ulcers.    Data Reviewed: Care during the described time interval was provided by me .  I have reviewed this patient's available data, including medical history, events of note, physical examination, and all test results as part of my  evaluation.  CBC: Recent Labs  Lab 08/01/20 0237 08/02/20 0354 08/04/20 0410 08/05/20 1012 08/06/20 0130  WBC 16.8* 25.7* 21.9* 16.0* 15.8*  NEUTROABS 15.6* 23.9* 20.6* 14.5* 14.7*  HGB 7.0* 8.1* 8.1* 7.5* 7.0*  HCT 21.3* 25.7* 24.9* 23.9* 22.2*  MCV 67.8* 69.3* 69.7* 70.5* 71.2*  PLT 293 313 285 228 161   Basic Metabolic Panel: Recent Labs  Lab 08/02/20 0354 08/03/20 0254 08/03/20 1108 08/04/20 0410 08/05/20 1012 08/06/20 0130  NA 138  --  137 139 136 140  K 4.0  --  3.9 4.1 3.9 3.9  CL 97*  --  98 99 100 103  CO2 25  --  27 26 23 26   GLUCOSE 123*  --  313* 91 167* 48*  BUN 59*  --  38* 44* 37* 46*  CREATININE 5.21*  --  4.06* 4.84* 3.91* 4.65*  CALCIUM 9.0  --  8.4* 8.8* 8.5* 8.6*  MG 1.9 1.9  --  2.1 2.0 2.1  PHOS 5.3* 4.0  --  4.4 4.9* 4.9*   GFR: Estimated Creatinine Clearance: 8.3 mL/min (A) (by C-G formula based on SCr of 4.65 mg/dL (H)). Liver Function Tests: Recent Labs  Lab 08/02/20 0354 08/03/20 1108  08/04/20 0410 08/05/20 1012 08/06/20 0130  AST 19 19 19 17  13*  ALT 18 18 18 18 14   ALKPHOS 72 72 75 72 63  BILITOT 0.8 0.2* 0.6 0.7 0.6  PROT 6.6 5.9* 6.4* 5.5* 5.1*  ALBUMIN 2.6* 2.4* 2.6* 2.4* 2.2*   No results for input(s): LIPASE, AMYLASE in the last 168 hours. No results for input(s): AMMONIA in the last 168 hours. Coagulation Profile: No results for input(s): INR, PROTIME in the last 168 hours. Cardiac Enzymes: No results for input(s): CKTOTAL, CKMB, CKMBINDEX, TROPONINI in the last 168 hours. BNP (last 3 results) No results for input(s): PROBNP in the last 8760 hours. HbA1C: No results for input(s): HGBA1C in the last 72 hours. CBG: Recent Labs  Lab 08/05/20 2123 08/06/20 0433 08/06/20 0522 08/06/20 0745 08/06/20 1143  GLUCAP 145* 58* 176* 141* 144*   Lipid Profile: No results for input(s): CHOL, HDL, LDLCALC, TRIG, CHOLHDL, LDLDIRECT in the last 72 hours. Thyroid Function Tests: No results for input(s): TSH, T4TOTAL, FREET4, T3FREE, THYROIDAB in the last 72 hours. Anemia Panel: Recent Labs    08/05/20 1012 08/06/20 0130  FERRITIN 2,029* 1,758*   Sepsis Labs: No results for input(s): PROCALCITON, LATICACIDVEN in the last 168 hours.  No results found for this or any previous visit (from the past 240 hour(s)).       Radiology Studies: No results found.      Scheduled Meds: . amiodarone  200 mg Oral Daily  . amLODipine  10 mg Oral Daily  . apixaban  2.5 mg Oral BID  . vitamin C  500 mg Oral Daily  . atorvastatin  20 mg Oral q1800  . chlorhexidine  15 mL Mouth Rinse BID  . Chlorhexidine Gluconate Cloth  6 each Topical Q0600  . cloNIDine  0.1 mg Oral TID  . [START ON 08/07/2020] darbepoetin (ARANESP) injection - DIALYSIS  150 mcg Intravenous Q Fri-HD  . haloperidol lactate  2 mg Intravenous Once  . insulin aspart  0-5 Units Subcutaneous QHS  . insulin aspart  0-9 Units Subcutaneous TID WC  . insulin detemir  7 Units Subcutaneous BID  .  linagliptin  5 mg Oral Daily  . mouth rinse  15 mL Mouth Rinse q12n4p  . methylPREDNISolone (SOLU-MEDROL) injection  40 mg Intravenous Q12H  . metoprolol tartrate  12.5 mg Oral BID  . sodium chloride flush  10-40 mL Intracatheter Q12H  . sodium chloride flush  3 mL Intravenous Q12H  . zinc sulfate  220 mg Oral Daily   Continuous Infusions:    LOS: 12 days    Time spent: 30-minute  Darliss Cheney, MD [facility]  If 7PM-7AM, please contact night-coverage [email or web address] Password [email or web address] 08/06/2020, 1:12 PM

## 2020-08-06 NOTE — Progress Notes (Signed)
Inpatient Diabetes Program Recommendations  AACE/ADA: New Consensus Statement on Inpatient Glycemic Control (2015)  Target Ranges:  Prepandial:   less than 140 mg/dL      Peak postprandial:   less than 180 mg/dL (1-2 hours)      Critically ill patients:  140 - 180 mg/dL   Lab Results  Component Value Date   GLUCAP 144 (H) 08/06/2020   HGBA1C 8.0 (H) 07/29/2020    Review of Glycemic Control Results for [name] [name], [name] [name] (MRN [id number]) as of 08/06/2020 13:08  Ref. Range 08/05/2020 21:23 08/06/2020 04:33 08/06/2020 05:22 08/06/2020 07:45 08/06/2020 11:43  Glucose-Capillary Latest Ref Range: 70 - 99 mg/dL 145 (H) 58 (L) 176 (H) 141 (H) 144 (H)   Inpatient Diabetes Program Recommendations:   D/C Novolog meal coverage. Patient refused meal coverage and above CBGs is without insulin meal coverage or correction given.  Thank you, Nani Gasser. Oreste Majeed, RN, MSN, CDE  Diabetes Coordinator Inpatient Glycemic Control Team Team Pager [phone] (8am-5pm) 08/06/2020 1:09 PM

## 2020-08-06 NOTE — Progress Notes (Addendum)
Transitions of Care Pharmacist Note  [name] [name] is a 84 y.o. female that has been diagnosed with A Fib and will be prescribed Eliquis (apixaban) at discharge.   Patient Education: I provided the following education on apixaban to the patient's daughter: How to take the medication Described what the medication is Signs of bleeding Signs/symptoms of VTE and stroke  Answered their questions  Discharge Medications Plan: [facility] [place]  Thank you,   Shauna Hugh, PharmD, [place]  PGY-1 Pharmacy Resident 08/06/2020 6:07 PM  Please check [email or web address] for unit-specific pharmacy phone numbers.

## 2020-08-06 NOTE — Progress Notes (Signed)
Physical Therapy Treatment Patient Details Name: [name] [name] MRN: [id number] DOB: Sep 21, 1935 Today's Date: 08/06/2020    History of Present Illness Pt is an 84 y.o. female admitted 07/25/20 with 5 days of cough, lethargy, confusion; found to be hypoxic and tested (+) COVID-19, also with AKI, afib with RVR, sepsis. Pt requiring HD initiation and  with permanent HD catheter placed 08/04/20. PMH includes RA, CKD 4, DM2, HTN, OSA on CPAP, TIA, CHF.    PT Comments    Patient lethargic with flat affect throughout session. Cognition remains impaired. On arrival, pt sidelying with sats 86% on 2L. Sat EOB with sats down to 79% and not increasing with education in pursed lip breathing (which pt has difficulty doing technique). Slowly increased to 8L (by 2L at a time while sats slowly gradually increased to 88-90%). Patient maintained 87-90% on 8L for activity. Once seated and resting for several minutes her sats incr to 96%. Gradually turned O2 down to 6L with pt's sats 94% by end of session. Ryan, RN informed of above issues with decr sats and incr in supplemental oxygen.     Follow Up Recommendations  SNF     Equipment Recommendations  3in1 (PT)    Recommendations for Other Services       Precautions / Restrictions Precautions Precautions: Fall Precaution Comments: wrist restraints Restrictions Weight Bearing Restrictions: No    Mobility  Bed Mobility Overal bed mobility: Needs Assistance Bed Mobility: Supine to Sit     Supine to sit: Min assist Sit to supine: Min assist   General bed mobility comments: light min A to come up to sitting and to initate approprite movements. Pt turning wrong way in bed and had difficulty following commands  Transfers Overall transfer level: Needs assistance Equipment used: 1 person hand held assist Transfers: Sit to/from Stand Sit to Stand: Min assist;+2 physical assistance;+2 safety/equipment         General transfer comment:  assist to rise and steady and for initiation of task  Ambulation/Gait Ambulation/Gait assistance: Mod assist Gait Distance (Feet): 5 Feet (20) Assistive device: 1 person hand held assist;2 person hand held assist Gait Pattern/deviations: Step-through pattern;Decreased stride length;Staggering left;Staggering right Gait velocity: decreased   General Gait Details: Assist for balance; attempted HHA as some difficulty with directing/using RW last visit. Very unsteady with staggering   Stairs             Wheelchair Mobility    Modified Rankin (Stroke Patients Only)       Balance Overall balance assessment: Needs assistance Sitting-balance support: Feet unsupported Sitting balance-Leahy Scale: Fair Sitting balance - Comments: Prolonged sitting EOB without feet support (due to pt's short height) without issue   Standing balance support: During functional activity;Single extremity supported Standing balance-Leahy Scale: Poor Standing balance comment: reliant on UE external support                            Cognition Arousal/Alertness: Lethargic Behavior During Therapy: Impulsive;Flat affect Overall Cognitive Status: No family/caregiver present to determine baseline cognitive functioning Area of Impairment: Attention;Memory;Following commands;Awareness;Problem solving;Orientation;Safety/judgement                 Orientation Level: Disoriented to;Time;Place;Situation (was oriented to date- pt could not recall after 2 mins) Current Attention Level: Focused;Sustained Memory: Decreased short-term memory;Decreased recall of precautions Following Commands: Follows one step commands inconsistently;Follows one step commands with increased time Safety/Judgement: Decreased awareness of safety;Decreased awareness of deficits Awareness:  Intellectual Problem Solving: Slow processing;Decreased initiation;Difficulty sequencing;Requires verbal cues;Requires tactile  cues General Comments: Decreased alertness from previous sessions. Requires step by step sequencing cues to complete simple ADL tasks. Very poor safety awarenes- was standing at sink and immediately sat down without checking if chair was behind her (OT had placed one)      Exercises      General Comments        Pertinent Vitals/Pain Pain Assessment: No/denies pain Faces Pain Scale: No hurt    Home Living                      Prior Function            PT Goals (current goals can now be found in the care plan section) Acute Rehab PT Goals Patient Stated Goal: family now agreeable to SNF per chart PT Goal Formulation: Patient unable to participate in goal setting Time For Goal Achievement: 08/11/20 Potential to Achieve Goals: Good Progress towards PT goals: Progressing toward goals    Frequency    Min 2X/week      PT Plan Current plan remains appropriate;Frequency needs to be updated    Co-evaluation PT/OT/SLP Co-Evaluation/Treatment: Yes Reason for Co-Treatment: Necessary to address cognition/behavior during functional activity;To address functional/ADL transfers PT goals addressed during session: Mobility/safety with mobility;Balance        AM-PAC PT "6 Clicks" Mobility   Outcome Measure  Help needed turning from your back to your side while in a flat bed without using bedrails?: A Little Help needed moving from lying on your back to sitting on the side of a flat bed without using bedrails?: A Little Help needed moving to and from a bed to a chair (including a wheelchair)?: A Little Help needed standing up from a chair using your arms (e.g., wheelchair or bedside chair)?: A Little Help needed to walk in hospital room?: A Lot Help needed climbing 3-5 steps with a railing? : A Lot 6 Click Score: 16    End of Session Equipment Utilized During Treatment: Gait belt;Oxygen Activity Tolerance: Patient limited by lethargy Patient left: with call bell/phone  within reach;in chair;with chair alarm set Nurse Communication: Mobility status;Other (comment) (low sats on arrival and required incr 2 to 8L for sats 88-90%) PT Visit Diagnosis: Other abnormalities of gait and mobility (R26.89);Muscle weakness (generalized) (M62.81)     Time: 2641-5830 PT Time Calculation (min) (ACUTE ONLY): 30 min  Charges:  $Therapeutic Activity: 8-22 mins                      Arby Barrette, PT Pager [phone]    Rexanne Mano 08/06/2020, 12:39 PM

## 2020-08-07 DIAGNOSIS — J9601 Acute respiratory failure with hypoxia: Secondary | ICD-10-CM | POA: Diagnosis not present

## 2020-08-07 DIAGNOSIS — I4891 Unspecified atrial fibrillation: Secondary | ICD-10-CM | POA: Diagnosis not present

## 2020-08-07 DIAGNOSIS — U071 COVID-19: Secondary | ICD-10-CM | POA: Diagnosis not present

## 2020-08-07 DIAGNOSIS — N179 Acute kidney failure, unspecified: Secondary | ICD-10-CM | POA: Diagnosis not present

## 2020-08-07 LAB — COMPREHENSIVE METABOLIC PANEL
ALT: 17 U/L (ref 0–44)
AST: 14 U/L — ABNORMAL LOW (ref 15–41)
Albumin: 2.3 g/dL — ABNORMAL LOW (ref 3.5–5.0)
Alkaline Phosphatase: 64 U/L (ref 38–126)
Anion gap: 12 (ref 5–15)
BUN: 65 mg/dL — ABNORMAL HIGH (ref 8–23)
CO2: 24 mmol/L (ref 22–32)
Calcium: 8.3 mg/dL — ABNORMAL LOW (ref 8.9–10.3)
Chloride: 100 mmol/L (ref 98–111)
Creatinine, Ser: 5.89 mg/dL — ABNORMAL HIGH (ref 0.44–1.00)
GFR, Estimated: 7 mL/min — ABNORMAL LOW (ref >60)
Glucose, Bld: 123 mg/dL — ABNORMAL HIGH (ref 70–99)
Potassium: 4.5 mmol/L (ref 3.5–5.1)
Sodium: 136 mmol/L (ref 135–145)
Total Bilirubin: 0.3 mg/dL (ref 0.3–1.2)
Total Protein: 5 g/dL — ABNORMAL LOW (ref 6.5–8.1)

## 2020-08-07 LAB — CBC WITH DIFFERENTIAL/PLATELET
Abs Immature Granulocytes: 0.2 10*3/uL — ABNORMAL HIGH (ref 0.00–0.07)
Basophils Absolute: 0 10*3/uL (ref 0.0–0.1)
Basophils Relative: 0 %
Eosinophils Absolute: 0 10*3/uL (ref 0.0–0.5)
Eosinophils Relative: 0 %
HCT: 21.7 % — ABNORMAL LOW (ref 36.0–46.0)
Hemoglobin: 6.9 g/dL — CL (ref 12.0–15.0)
Immature Granulocytes: 1 %
Lymphocytes Relative: 2 %
Lymphs Abs: 0.2 10*3/uL — ABNORMAL LOW (ref 0.7–4.0)
MCH: 22.7 pg — ABNORMAL LOW (ref 26.0–34.0)
MCHC: 31.8 g/dL (ref 30.0–36.0)
MCV: 71.4 fL — ABNORMAL LOW (ref 80.0–100.0)
Monocytes Absolute: 0.4 10*3/uL (ref 0.1–1.0)
Monocytes Relative: 2 %
Neutro Abs: 13.8 10*3/uL — ABNORMAL HIGH (ref 1.7–7.7)
Neutrophils Relative %: 95 %
Platelets: 188 10*3/uL (ref 150–400)
RBC: 3.04 MIL/uL — ABNORMAL LOW (ref 3.87–5.11)
RDW: 18.3 % — ABNORMAL HIGH (ref 11.5–15.5)
WBC: 14.6 10*3/uL — ABNORMAL HIGH (ref 4.0–10.5)
nRBC: 1.4 % — ABNORMAL HIGH (ref 0.0–0.2)

## 2020-08-07 LAB — LACTATE DEHYDROGENASE: LDH: 267 U/L — ABNORMAL HIGH (ref 98–192)

## 2020-08-07 LAB — CBC
HCT: 24.2 % — ABNORMAL LOW (ref 36.0–46.0)
Hemoglobin: 7.5 g/dL — ABNORMAL LOW (ref 12.0–15.0)
MCH: 22.3 pg — ABNORMAL LOW (ref 26.0–34.0)
MCHC: 31 g/dL (ref 30.0–36.0)
MCV: 71.8 fL — ABNORMAL LOW (ref 80.0–100.0)
Platelets: 228 10*3/uL (ref 150–400)
RBC: 3.37 MIL/uL — ABNORMAL LOW (ref 3.87–5.11)
RDW: 18.5 % — ABNORMAL HIGH (ref 11.5–15.5)
WBC: 15.6 10*3/uL — ABNORMAL HIGH (ref 4.0–10.5)
nRBC: 1.5 % — ABNORMAL HIGH (ref 0.0–0.2)

## 2020-08-07 LAB — MAGNESIUM: Magnesium: 2.2 mg/dL (ref 1.7–2.4)

## 2020-08-07 LAB — ABO/RH: ABO/RH(D): B POS

## 2020-08-07 LAB — GLUCOSE, CAPILLARY
Glucose-Capillary: 149 mg/dL — ABNORMAL HIGH (ref 70–99)
Glucose-Capillary: 171 mg/dL — ABNORMAL HIGH (ref 70–99)
Glucose-Capillary: 159 mg/dL — ABNORMAL HIGH (ref 70–99)

## 2020-08-07 LAB — PREPARE RBC (CROSSMATCH)

## 2020-08-07 LAB — PHOSPHORUS: Phosphorus: 6.7 mg/dL — ABNORMAL HIGH (ref 2.5–4.6)

## 2020-08-07 LAB — D-DIMER, QUANTITATIVE: D-Dimer, Quant: 1.76 ug/mL-FEU — ABNORMAL HIGH (ref 0.00–0.50)

## 2020-08-07 LAB — FERRITIN: Ferritin: 1604 ng/mL — ABNORMAL HIGH (ref 11–307)

## 2020-08-07 LAB — C-REACTIVE PROTEIN: CRP: 1.1 mg/dL — ABNORMAL HIGH (ref <1.0)

## 2020-08-07 MED ORDER — ALTEPLASE 2 MG IJ SOLR: 2.0000 mg | Freq: Once | INTRAMUSCULAR | Status: DC | PRN

## 2020-08-07 MED ORDER — PANTOPRAZOLE SODIUM 40 MG PO TBEC
40.0000 mg | DELAYED_RELEASE_TABLET | Freq: Every day | ORAL | Status: DC
  Administered 2020-08-07 – 2020-08-14 (×8): 40 mg via ORAL
  Filled 2020-08-07 (×7): qty 1

## 2020-08-07 MED ORDER — LIDOCAINE HCL (PF) 1 % IJ SOLN: 5.0000 mL | INTRAMUSCULAR | Status: DC | PRN

## 2020-08-07 MED ORDER — LIDOCAINE-PRILOCAINE 2.5-2.5 % EX CREA: 1.0000 "application " | TOPICAL_CREAM | CUTANEOUS | Status: DC | PRN

## 2020-08-07 MED ORDER — SODIUM CHLORIDE 0.9 % IV SOLN: 100.0000 mL | INTRAVENOUS | Status: DC | PRN

## 2020-08-07 MED ORDER — SODIUM CHLORIDE 0.9% IV SOLUTION: Freq: Once | INTRAVENOUS | Status: DC

## 2020-08-07 MED ORDER — METHYLPREDNISOLONE SODIUM SUCC 40 MG IJ SOLR
40.0000 mg | Freq: Every day | INTRAMUSCULAR | Status: DC
  Administered 2020-08-08: 40 mg via INTRAVENOUS
  Filled 2020-08-07: qty 1

## 2020-08-07 MED ORDER — HEPARIN SODIUM (PORCINE) 1000 UNIT/ML DIALYSIS: 20.0000 [IU]/kg | INTRAMUSCULAR | Status: DC | PRN

## 2020-08-07 MED ORDER — PENTAFLUOROPROP-TETRAFLUOROETH EX AERO: 1.0000 "application " | INHALATION_SPRAY | CUTANEOUS | Status: DC | PRN

## 2020-08-07 MED ORDER — HEPARIN SODIUM (PORCINE) 1000 UNIT/ML DIALYSIS: 1000.0000 [IU] | INTRAMUSCULAR | Status: DC | PRN

## 2020-08-07 NOTE — Progress Notes (Signed)
Patient's lab Hgb down to 6.9 from 7.0 yesterday. Provider notified and orders to give blood received. Because of patient's confusion and AMS patient's daughters were called to give consent for blood administration and voice messages left. No call back received as of this time.   Update, @0645  family was again called to receive blood consent but no response, messages left to return call. Will continue to monitor patient.

## 2020-08-07 NOTE — Progress Notes (Signed)
Patient refused CPAP HS. Patient stated she does not wear a CPAP and does not want that thing on her face.

## 2020-08-07 NOTE — Progress Notes (Signed)
[facility] NEPHROLOGY PROGRESS NOTE  Assessment/ Plan:  # AKIon CKD stage IV, dialysis dependent and now progressed to new ESRD: In setting of sepsis due to covid-19 and CAP. Baseline Scr 2.7-3. Renal US without obstruction but chronicity. No improvement with IVF's and rising BUN/Cr.  Started dialysis on 12/22 after placement of temporary HD catheter.  Since then she has been tolerating dialysis well.   The HD catheter was converted to tunnel on 12/28. Dialysis today maintaining MWF schedule. She is accepted at [facility] starting on Monday. Plan for permanent access placement when Covid negative, as outpatient.  #Acute hypoxemic respiratory failure due to covid-19 and community acquired PNA-continue management per primary team  # Sepsis due to covid-19 and CAP- improving.  # Atrial fib with RVR- started on amiodarone per Cardiology.  # Acute metabolic encephalopathy- likely multifactorial with AKI, covid-19, hypoxia, and sepsis.  Overall improved  # Anemia of acute illness and CKD stage IV- follow H/H and iron stores. -  Aranesp added.  Transfusion as needed, hemoglobin 6.9 today likely will need 1 unit  # HTN- stable.  Continue amlodipine.  Subjective: Seen and examined.  Patient feels well without significant complaints.  Denies chest pain.  Mild shortness of breath.  Anemia worsened today considering transfusion.  Objective Vital signs in last 24 hours: Vitals:   08/07/20 0500 08/07/20 0824 08/07/20 0839 08/07/20 0901  BP:  139/66 (!) 143/56 (!) 141/56  Pulse:  75 74 78  Resp:  14 14 17   Temp:  98.3 F (36.8 C) 98.6 F (37 C) 97.6 F (36.4 C)  TempSrc:  Oral Oral Oral  SpO2:  90% 91% 92%  Weight: 68.9 kg     Height:       Weight change: 0.2 kg  Intake/Output Summary (Last 24 hours) at 08/07/2020 1142 Last data filed at 08/07/2020 1026 Gross per 24 hour  Intake 120 ml  Output --  Net 120 ml       Labs: Basic Metabolic  Panel: Recent Labs  Lab 08/05/20 1012 08/06/20 0130 08/07/20 0230  NA 136 140 136  K 3.9 3.9 4.5  CL 100 103 100  CO2 23 26 24   GLUCOSE 167* 48* 123*  BUN 37* 46* 65*  CREATININE 3.91* 4.65* 5.89*  CALCIUM 8.5* 8.6* 8.3*  PHOS 4.9* 4.9* 6.7*   Liver Function Tests: Recent Labs  Lab 08/05/20 1012 08/06/20 0130 08/07/20 0230  AST 17 13* 14*  ALT 18 14 17   ALKPHOS 72 63 64  BILITOT 0.7 0.6 0.3  PROT 5.5* 5.1* 5.0*  ALBUMIN 2.4* 2.2* 2.3*   No results for input(s): LIPASE, AMYLASE in the last 168 hours. No results for input(s): AMMONIA in the last 168 hours. CBC: Recent Labs  Lab 08/04/20 0410 08/05/20 1012 08/06/20 0130 08/07/20 0230 08/07/20 0647  WBC 21.9* 16.0* 15.8* 14.6* 15.6*  NEUTROABS 20.6* 14.5* 14.7* 13.8*  --   HGB 8.1* 7.5* 7.0* 6.9* 7.5*  HCT 24.9* 23.9* 22.2* 21.7* 24.2*  MCV 69.7* 70.5* 71.2* 71.4* 71.8*  PLT 285 228 226 188 228   Cardiac Enzymes: No results for input(s): CKTOTAL, CKMB, CKMBINDEX, TROPONINI in the last 168 hours. CBG: Recent Labs  Lab 08/06/20 0745 08/06/20 1143 08/06/20 1619 08/06/20 1948 08/06/20 2309  GLUCAP 141* 144* 222* 189* 120*    Iron Studies:  Recent Labs    08/07/20 0230  FERRITIN 1,604*   Studies/Results: No results found.  Medications: Infusions: . sodium chloride    .  sodium chloride      Scheduled Medications: . sodium chloride   Intravenous Once  . amiodarone  200 mg Oral Daily  . amLODipine  10 mg Oral Daily  . apixaban  2.5 mg Oral BID  . vitamin C  500 mg Oral Daily  . atorvastatin  20 mg Oral q1800  . chlorhexidine  15 mL Mouth Rinse BID  . Chlorhexidine Gluconate Cloth  6 each Topical Q0600  . cloNIDine  0.1 mg Oral TID  . darbepoetin (ARANESP) injection - DIALYSIS  150 mcg Intravenous Q Fri-HD  . haloperidol lactate  2 mg Intravenous Once  . insulin aspart  0-5 Units Subcutaneous QHS  . insulin aspart  0-9 Units Subcutaneous TID WC  . insulin detemir  7 Units Subcutaneous BID  .  linagliptin  5 mg Oral Daily  . mouth rinse  15 mL Mouth Rinse q12n4p  . methylPREDNISolone (SOLU-MEDROL) injection  40 mg Intravenous Q12H  . metoprolol tartrate  12.5 mg Oral BID  . sodium chloride flush  10-40 mL Intracatheter Q12H  . sodium chloride flush  3 mL Intravenous Q12H  . zinc sulfate  220 mg Oral Daily    have reviewed scheduled and prn medications.  Physical Exam: General:NAD, lying on bed comfortable Heart:normal rate, no audible murmur Lungs: bilateral chest rise, no iwob Abdomen:soft, Non-tender, non-distended Extremities:No LE edema, warm and well perfused Dialysis Access: Right IJ tunnel catheter placed by IR on 12/28, site clean.  [name] [name] 08/07/2020,11:42 AM  LOS: 13 days

## 2020-08-07 NOTE — Progress Notes (Signed)
Blood stopped. Patient removed IV while blood infusing.

## 2020-08-07 NOTE — Progress Notes (Signed)
Received consult for PIV access. Was able to get a 22g IV in rfa. Patient had multiple IV infiltrate/pulled out. Limited venous access remaining. Since she is a HD patient, recommend a central line for IV needs.

## 2020-08-07 NOTE — Progress Notes (Signed)
PROGRESS NOTE    [name] [name]  ALP:[id number] DOB: 11-13-1935 DOA: 07/25/2020 PCP: [name], MD     Brief Narrative:  [name] [name] is an 84 year old female with history of RA, CKD 4, DM 2, HTN, HLD, OSA on CPAP, TIA, chronic diastolic CHF.   To the hospital with 5 days of cough, lethargy, confusion. Was found to be hypoxic, tested positive for Covid and was admitted to the hospital. She was also found to have acute kidney injury. She was also found to have A. fib with RVR. Cardiology and nephrology were consulted on admission. Renal function has not improved and the patient is transferred to [facility] in anticipation of dialysis initiation.   Subjective: Patient seen and examined.  No significant events overnight, but later in the morning she pulled her IV IV transfusion.  Assessment & Plan:  Covid vaccination; unvaccinated   Principal Problem:   Acute hypoxemic respiratory failure due to COVID-19 [facility]) Active Problems:   Essential hypertension   HLD (hyperlipidemia)   DM (diabetes mellitus), type 2 with renal complications (HCC)   Chronic diastolic HF (heart failure) ([place])   CAP (community acquired pneumonia)   CKD (chronic kidney disease), stage IV ([place])   AKI (acute kidney injury) ([place])   Sepsis ([place])   Atrial fibrillation with RVR ([place])  Acute respiratory failure with hypoxia/Covid pneumonia/suspected bacterial superinfection COVID-19 Labs  Recent Labs    08/05/20 1012 08/06/20 0130 08/07/20 0230  DDIMER 1.97* 1.70* 1.76*  FERRITIN 2,029* 1,758* 1,604*  LDH 316* 240* 267*  CRP 2.1* 1.5* 1.1*    Lab Results  Component Value Date   SARSCOV2NAA POSITIVE (A) 07/25/2020   -Patient with CrCl<48ml/min therefore ineligible for Remdesivir or Baricitinib -In addition possible bacterial superinfection initial procalcitonin=Results for EULALA, NEWCOMBE (MRN [id number]) as of 07/28/2020 19:45  Ref. Range 07/25/2020 08:40  Procalcitonin  Latest Units: ng/mL 2.35   -Cultures NGTD -12/22 spoke with pharmacy all antibiotics have been completed.   -Solu-Medrol 40 mg BID -DuoNeb QID -Flutter valve -Incentive spirometry  Sepsis pneumonia (Covid 19/suspected CAP) -On admission criteria for sepsis tachycardia, tachypnea, leukocytosis -Cultures obtained, placed on antibiotics.  All physiologic signs of sepsis have resolved.  Antibiotic course completed.  Elevated D-dimer D-dimer is only minimally elevated and its significance is pretty low as this can be elevated in COVID-19 infections as well as ESRD patients.  Since patient is improving, I personally highly doubt any thromboembolism.  Acute on CKD stage IV (baseline Cr 2.4-2.7) -On presentation Cr 6.8 not improving -Transferred to [facility] per nephrology. Now dialysis dependent.  Nephrology managing.  Anion gap metabolic acidosis Resolved.  On dialysis now.  Chronic diastolic CHF -BNP on admission= 259 -Strict in and out  -Daily weight Filed Weights   08/06/20 0451 08/07/20 0500 08/07/20 1440  Weight: 68.9 kg 68.9 kg 67.1 kg  -  A. fib with RVR -CHADS2Vasc score is 7 (HTN, age x 2, DM, TIA x2, gender),  -Cardiology consulted -Last seen by cardiology on 12/18 per their note would consider outpatient monitor after systemic illness have resolved.  -Cardiology recommends echocardiogram once rate better controlled. -12/22 Echocardiogram; diastolic dysfunction, pulmonary HTN see echo below.  Continue amiodaroneAnd Lopressor.  she is currently on Eliquis  Essential HTN Controlled.  Continue current management.  Pulmonary HTN -See CHF  Acute metabolic encephalopathy Resolved.  She is sleepy but easily arousable.  She is oriented but needs a little cueing.  Hx TIA Transitioned from heparin to Eliquis.  No aspirin. -Lipitor  20 mg daily -12/22 LDL = 57: Goal LDL <70   DM type II uncontrolled with Hyperglycemia Controlled.  Continue current  regimen.  Anemia -Anemia of acute illness and chronic kidney disease, hemoglobin 6.9, she will be transfused, Aranesp per renal  DVT prophylaxis: Eliquis Code Status: Full Family Communication: D/W daughter [name] 08/07/2020 Status is: Inpatient    Dispo: The patient is from:               Anticipated d/c is to: SNF              Anticipated d/c date is: When bed is available and insurance authorization obtained.              Patient currently unstable       Consultants:  Nephrology Cardiology  Procedures/Significant Events:  12/22 nontunneled HD catheter terminating within the superior aspect RIGHT atrium 12/23 echocardiogram limited;LVEF= 55 to 60%.  -Grade I diastolic dysfunction  Right Ventricle: Mldly elevated pulmonary artery systolic pressure. estimated  right ventricular systolic pressure is 17.6 mmHg.    I have personally reviewed and interpreted all radiology studies and my findings are as above.  VENTILATOR SETTINGS: HFNC 12/28 Flow; 4 L/min SPO2; percent   Cultures 12/18 blood RIGHT AC negative 12/18 MRSA by PCR negative 12/18 blood right hand negative final x2    Antimicrobials: Anti-infectives (From admission, onward)   Start     Ordered Stop   08/04/20 1733  vancomycin (VANCOCIN) 1-5 GM/200ML-% IVPB       Note to Pharmacy: Fredric Dine   : cabinet override   08/04/20 1733 08/05/20 0544   08/04/20 1445  vancomycin (VANCOCIN) IVPB 1000 mg/200 mL premix        08/04/20 1356 08/04/20 1911   07/26/20 1000  cefTRIAXone (ROCEPHIN) 1 g in sodium chloride 0.9 % 100 mL IVPB        07/25/20 1138 07/29/20 0911   07/26/20 1000  doxycycline (VIBRAMYCIN) 100 mg in sodium chloride 0.9 % 250 mL IVPB        07/25/20 1138 07/27/20 2335   07/25/20 0500  levofloxacin (LEVAQUIN) IVPB 750 mg        07/25/20 0453 07/25/20 0824        Devices    LINES / TUBES:      Continuous Infusions: . sodium chloride    . sodium chloride        Objective: Vitals:   08/07/20 1440 08/07/20 1500 08/07/20 1530 08/07/20 1600  BP: (!) 146/69 (!) 114/58 (!) 97/53 126/63  Pulse:   76   Resp: 16 14  (!) 28  Temp: (!) 97.5 F (36.4 C)  (!) 97.5 F (36.4 C)   TempSrc: Axillary  Axillary   SpO2:      Weight: 67.1 kg     Height:        Intake/Output Summary (Last 24 hours) at 08/07/2020 1618 Last data filed at 08/07/2020 1026 Gross per 24 hour  Intake 120 ml  Output --  Net 120 ml   Filed Weights   08/06/20 0451 08/07/20 0500 08/07/20 1440  Weight: 68.9 kg 68.9 kg 67.1 kg   Physical exam:  Awake Alert,  No new F.N deficits, Normal affect Symmetrical Chest wall movement, Good air movement bilaterally, CTAB RRR,No Gallops,Rubs or new Murmurs, No Parasternal Heave +ve B.Sounds, Abd Soft, No tenderness, No rebound - guarding or rigidity. No Cyanosis, Clubbing or edema, No new Rash or bruise      CBC: Recent  Labs  Lab 08/02/20 0354 08/04/20 0410 08/05/20 1012 08/06/20 0130 08/07/20 0230 08/07/20 0647  WBC 25.7* 21.9* 16.0* 15.8* 14.6* 15.6*  NEUTROABS 23.9* 20.6* 14.5* 14.7* 13.8*  --   HGB 8.1* 8.1* 7.5* 7.0* 6.9* 7.5*  HCT 25.7* 24.9* 23.9* 22.2* 21.7* 24.2*  MCV 69.3* 69.7* 70.5* 71.2* 71.4* 71.8*  PLT 313 285 228 226 188 222   Basic Metabolic Panel: Recent Labs  Lab 08/03/20 0254 08/03/20 1108 08/04/20 0410 08/05/20 1012 08/06/20 0130 08/07/20 0230  NA  --  137 139 136 140 136  K  --  3.9 4.1 3.9 3.9 4.5  CL  --  98 99 100 103 100  CO2  --  27 26 23 26 24   GLUCOSE  --  313* 91 167* 48* 123*  BUN  --  38* 44* 37* 46* 65*  CREATININE  --  4.06* 4.84* 3.91* 4.65* 5.89*  CALCIUM  --  8.4* 8.8* 8.5* 8.6* 8.3*  MG 1.9  --  2.1 2.0 2.1 2.2  PHOS 4.0  --  4.4 4.9* 4.9* 6.7*   GFR: Estimated Creatinine Clearance: 6.5 mL/min (A) (by C-G formula based on SCr of 5.89 mg/dL (H)). Liver Function Tests: Recent Labs  Lab 08/03/20 1108 08/04/20 0410 08/05/20 1012 08/06/20 0130 08/07/20 0230  AST 19  19 17  13* 14*  ALT 18 18 18 14 17   ALKPHOS 72 75 72 63 64  BILITOT 0.2* 0.6 0.7 0.6 0.3  PROT 5.9* 6.4* 5.5* 5.1* 5.0*  ALBUMIN 2.4* 2.6* 2.4* 2.2* 2.3*   No results for input(s): LIPASE, AMYLASE in the last 168 hours. No results for input(s): AMMONIA in the last 168 hours. Coagulation Profile: No results for input(s): INR, PROTIME in the last 168 hours. Cardiac Enzymes: No results for input(s): CKTOTAL, CKMB, CKMBINDEX, TROPONINI in the last 168 hours. BNP (last 3 results) No results for input(s): PROBNP in the last 8760 hours. HbA1C: No results for input(s): HGBA1C in the last 72 hours. CBG: Recent Labs  Lab 08/06/20 1619 08/06/20 1948 08/06/20 2309 08/07/20 0905 08/07/20 1233  GLUCAP 222* 189* 120* 149* 171*   Lipid Profile: No results for input(s): CHOL, HDL, LDLCALC, TRIG, CHOLHDL, LDLDIRECT in the last 72 hours. Thyroid Function Tests: No results for input(s): TSH, T4TOTAL, FREET4, T3FREE, THYROIDAB in the last 72 hours. Anemia Panel: Recent Labs    08/06/20 0130 08/07/20 0230  FERRITIN 1,758* 1,604*   Sepsis Labs: No results for input(s): PROCALCITON, LATICACIDVEN in the last 168 hours.  No results found for this or any previous visit (from the past 240 hour(s)).       Radiology Studies: No results found.      Scheduled Meds: . sodium chloride   Intravenous Once  . sodium chloride   Intravenous Once  . amiodarone  200 mg Oral Daily  . amLODipine  10 mg Oral Daily  . apixaban  2.5 mg Oral BID  . vitamin C  500 mg Oral Daily  . atorvastatin  20 mg Oral q1800  . chlorhexidine  15 mL Mouth Rinse BID  . Chlorhexidine Gluconate Cloth  6 each Topical Q0600  . cloNIDine  0.1 mg Oral TID  . darbepoetin (ARANESP) injection - DIALYSIS  150 mcg Intravenous Q Fri-HD  . haloperidol lactate  2 mg Intravenous Once  . insulin aspart  0-5 Units Subcutaneous QHS  . insulin aspart  0-9 Units Subcutaneous TID WC  . insulin detemir  7 Units Subcutaneous BID  .  linagliptin  5 mg Oral Daily  . mouth rinse  15 mL Mouth Rinse q12n4p  . methylPREDNISolone (SOLU-MEDROL) injection  40 mg Intravenous Q12H  . metoprolol tartrate  12.5 mg Oral BID  . sodium chloride flush  10-40 mL Intracatheter Q12H  . sodium chloride flush  3 mL Intravenous Q12H  . zinc sulfate  220 mg Oral Daily   Continuous Infusions: . sodium chloride    . sodium chloride       LOS: 13 days     Phillips Climes, MD [facility]  If 7PM-7AM, please contact night-coverage [email or web address] Password [email or web address] 08/07/2020, 4:18 PM

## 2020-08-08 DIAGNOSIS — N179 Acute kidney failure, unspecified: Secondary | ICD-10-CM | POA: Diagnosis not present

## 2020-08-08 DIAGNOSIS — J9601 Acute respiratory failure with hypoxia: Secondary | ICD-10-CM | POA: Diagnosis not present

## 2020-08-08 DIAGNOSIS — R410 Disorientation, unspecified: Secondary | ICD-10-CM

## 2020-08-08 DIAGNOSIS — I4891 Unspecified atrial fibrillation: Secondary | ICD-10-CM | POA: Diagnosis not present

## 2020-08-08 DIAGNOSIS — U071 COVID-19: Secondary | ICD-10-CM | POA: Diagnosis not present

## 2020-08-08 LAB — CBC WITH DIFFERENTIAL/PLATELET
Abs Immature Granulocytes: 0.14 10*3/uL — ABNORMAL HIGH (ref 0.00–0.07)
Basophils Absolute: 0 10*3/uL (ref 0.0–0.1)
Basophils Relative: 0 %
Eosinophils Absolute: 0 10*3/uL (ref 0.0–0.5)
Eosinophils Relative: 0 %
HCT: 30.3 % — ABNORMAL LOW (ref 36.0–46.0)
Hemoglobin: 10.3 g/dL — ABNORMAL LOW (ref 12.0–15.0)
Immature Granulocytes: 1 %
Lymphocytes Relative: 4 %
Lymphs Abs: 0.5 10*3/uL — ABNORMAL LOW (ref 0.7–4.0)
MCH: 24.9 pg — ABNORMAL LOW (ref 26.0–34.0)
MCHC: 34 g/dL (ref 30.0–36.0)
MCV: 73.2 fL — ABNORMAL LOW (ref 80.0–100.0)
Monocytes Absolute: 1 10*3/uL (ref 0.1–1.0)
Monocytes Relative: 7 %
Neutro Abs: 12.8 10*3/uL — ABNORMAL HIGH (ref 1.7–7.7)
Neutrophils Relative %: 88 %
Platelets: 202 10*3/uL (ref 150–400)
RBC: 4.14 MIL/uL (ref 3.87–5.11)
RDW: 20 % — ABNORMAL HIGH (ref 11.5–15.5)
WBC: 14.5 10*3/uL — ABNORMAL HIGH (ref 4.0–10.5)
nRBC: 0.6 % — ABNORMAL HIGH (ref 0.0–0.2)

## 2020-08-08 LAB — TYPE AND SCREEN
ABO/RH(D): B POS
Antibody Screen: NEGATIVE
Unit division: 0
Unit division: 0

## 2020-08-08 LAB — GLUCOSE, CAPILLARY
Glucose-Capillary: 119 mg/dL — ABNORMAL HIGH (ref 70–99)
Glucose-Capillary: 163 mg/dL — ABNORMAL HIGH (ref 70–99)
Glucose-Capillary: 182 mg/dL — ABNORMAL HIGH (ref 70–99)
Glucose-Capillary: 220 mg/dL — ABNORMAL HIGH (ref 70–99)
Glucose-Capillary: 228 mg/dL — ABNORMAL HIGH (ref 70–99)
Glucose-Capillary: 98 mg/dL (ref 70–99)

## 2020-08-08 LAB — BPAM RBC
Blood Product Expiration Date: 202201212359
Blood Product Expiration Date: 202201232359
ISSUE DATE / TIME: 202112310814
ISSUE DATE / TIME: 202112311542
Unit Type and Rh: 7300
Unit Type and Rh: 7300

## 2020-08-08 MED ORDER — HALOPERIDOL LACTATE 5 MG/ML IJ SOLN
1.0000 mg | Freq: Four times a day (QID) | INTRAMUSCULAR | Status: DC | PRN
  Administered 2020-08-08: 1 mg via INTRAVENOUS
  Filled 2020-08-08: qty 1

## 2020-08-08 MED ORDER — DARBEPOETIN ALFA 150 MCG/0.3ML IJ SOSY
150.0000 ug | PREFILLED_SYRINGE | Freq: Once | INTRAMUSCULAR | Status: AC
  Administered 2020-08-08: 150 ug via SUBCUTANEOUS
  Filled 2020-08-08 (×2): qty 0.3

## 2020-08-08 MED ORDER — QUETIAPINE FUMARATE 25 MG PO TABS
25.0000 mg | ORAL_TABLET | Freq: Every day | ORAL | Status: DC
  Administered 2020-08-08 – 2020-08-10 (×3): 25 mg via ORAL
  Filled 2020-08-08 (×3): qty 1

## 2020-08-08 NOTE — Progress Notes (Signed)
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 Baseline Scr 2.7-3. Renal US without obstruction but chronicity. No improvement with IVF's and rising BUN/Cr.  Started dialysis on 12/22 after placement of temporary HD catheter.  Since then she has been tolerating dialysis well.   The HD catheter was converted to tunnel on 12/28. Dialysis today maintaining MWF schedule. She is accepted at [facility] starting on Monday but may not be medically stable -Continue dialysis MWF Plan for permanent access placement when Covid negative, as outpatient.  #Acute hypoxemic respiratory failure due to covid-19 and community acquired PNA-continue management per primary team  # Sepsis due to covid-19 and CAP- hypoxia worsening today. Mgmt per primary team  # Atrial fib with RVR- started on amiodarone per Cardiology.  # Acute metabolic encephalopathy- likely multifactorial with AKI, covid-19, hypoxia, and sepsis. Worsened today per documentation ctm  # Anemia of acute illness and CKD stage IV- follow H/H and iron stores. -  Aranesp added.  Transfusion as needed, hemoglobin 6.9 today likely will need 1 unit  # HTN- elevated today possibly from agitation.  Continue amlodipine.  Subjective: Chart review only today. More delirious and worsening hypoxia. Tolerated dialysis w/ 2L UF.  Objective Vital signs in last 24 hours: Vitals:   08/07/20 2347 08/08/20 0342 08/08/20 0754 08/08/20 0826  BP: 132/64 (!) 158/70 (!) 169/70   Pulse: 66 86 88   Resp: 16 18 18    Temp: 98 F (36.7 C) 98 F (36.7 C) (!) 97.3 F (36.3 C)   TempSrc: Axillary Axillary Oral   SpO2: 100% 94% 98% 95%  Weight:  62.9 kg    Height:       Weight change: -1.8 kg  Intake/Output Summary (Last 24 hours) at 08/08/2020 1023 Last data filed at 08/07/2020 1825 Gross per 24 hour   Intake 435 ml  Output 2005 ml  Net -1570 ml       Labs: Basic Metabolic Panel: Recent Labs  Lab 08/05/20 1012 08/06/20 0130 08/07/20 0230  NA 136 140 136  K 3.9 3.9 4.5  CL 100 103 100  CO2 23 26 24   GLUCOSE 167* 48* 123*  BUN 37* 46* 65*  CREATININE 3.91* 4.65* 5.89*  CALCIUM 8.5* 8.6* 8.3*  PHOS 4.9* 4.9* 6.7*   Liver Function Tests: Recent Labs  Lab 08/05/20 1012 08/06/20 0130 08/07/20 0230  AST 17 13* 14*  ALT 18 14 17   ALKPHOS 72 63 64  BILITOT 0.7 0.6 0.3  PROT 5.5* 5.1* 5.0*  ALBUMIN 2.4* 2.2* 2.3*   No results for input(s): LIPASE, AMYLASE in the last 168 hours. No results for input(s): AMMONIA in the last 168 hours. CBC: Recent Labs  Lab 08/05/20 1012 08/06/20 0130 08/07/20 0230 08/07/20 0647 08/08/20 0316  WBC 16.0* 15.8* 14.6* 15.6* 14.5*  NEUTROABS 14.5* 14.7* 13.8*  --  12.8*  HGB 7.5* 7.0* 6.9* 7.5* 10.3*  HCT 23.9* 22.2* 21.7* 24.2* 30.3*  MCV 70.5* 71.2* 71.4* 71.8* 73.2*  PLT 228 226 188 228 202   Cardiac Enzymes: No results for input(s): CKTOTAL, CKMB, CKMBINDEX, TROPONINI in the last 168 hours. CBG: Recent Labs  Lab 08/06/20 2309 08/07/20 0905 08/07/20 1233 08/07/20 2030 08/08/20 0758  GLUCAP 120* 149* 171* 159* 163*    Iron Studies:  Recent Labs    08/07/20 0230  FERRITIN 1,604*   Studies/Results: No  results found.  Medications: Infusions:   Scheduled Medications: . sodium chloride   Intravenous Once  . sodium chloride   Intravenous Once  . amiodarone  200 mg Oral Daily  . amLODipine  10 mg Oral Daily  . apixaban  2.5 mg Oral BID  . vitamin C  500 mg Oral Daily  . atorvastatin  20 mg Oral q1800  . chlorhexidine  15 mL Mouth Rinse BID  . Chlorhexidine Gluconate Cloth  6 each Topical Q0600  . cloNIDine  0.1 mg Oral TID  . darbepoetin (ARANESP) injection - DIALYSIS  150 mcg Intravenous Q Fri-HD  . haloperidol lactate  2 mg Intravenous Once  . insulin aspart  0-5 Units Subcutaneous QHS  . insulin aspart   0-9 Units Subcutaneous TID WC  . insulin detemir  7 Units Subcutaneous BID  . linagliptin  5 mg Oral Daily  . mouth rinse  15 mL Mouth Rinse q12n4p  . methylPREDNISolone (SOLU-MEDROL) injection  40 mg Intravenous Daily  . metoprolol tartrate  12.5 mg Oral BID  . pantoprazole  40 mg Oral Daily  . QUEtiapine  25 mg Oral QHS  . sodium chloride flush  10-40 mL Intracatheter Q12H  . sodium chloride flush  3 mL Intravenous Q12H  . zinc sulfate  220 mg Oral Daily    have reviewed scheduled and prn medications.  Physical Exam: Not examined today  Reesa Chew 08/08/2020,10:23 AM  LOS: 14 days

## 2020-08-08 NOTE — Progress Notes (Signed)
Cross-coverage note:   Patient delirious, removing IVs and monitoring equipment, trying to get out of bed and is fall-risk. She is unable to be redirected. No sitters available. Plan to use soft restraints for patient safety, reassess frequently, and remove as soon as possible.

## 2020-08-08 NOTE — Progress Notes (Signed)
Pt stated she doesn't want to wear CPAP. No distress noted.

## 2020-08-08 NOTE — Progress Notes (Addendum)
PROGRESS NOTE    [name] [name]  GHW:[id number] DOB: 12/29/1935 DOA: 07/25/2020 PCP: [name], MD     Brief Narrative:  [name] [name] is an 85 year old female with history of RA, CKD 4, DM 2, HTN, HLD, OSA on CPAP, TIA, chronic diastolic CHF.   To the hospital with 5 days of cough, lethargy, confusion. Was found to be hypoxic, tested positive for Covid and was admitted to the hospital. She was also found to have acute kidney injury. She was also found to have A. fib with RVR. Cardiology and nephrology were consulted on admission. Renal function has not improved and the patient is transferred to [facility] in anticipation of dialysis initiation.   Subjective: Patient seen and examined.  She is with no significant events overnight as discussed with staff, as well no events this morning, she has been sitting on her recliner since a.m. with no significant events.    Assessment & Plan:  Covid vaccination; unvaccinated   Principal Problem:   Acute hypoxemic respiratory failure due to COVID-19 [facility]) Active Problems:   Essential hypertension   HLD (hyperlipidemia)   DM (diabetes mellitus), type 2 with renal complications (HCC)   Chronic diastolic HF (heart failure) (HCC)   CAP (community acquired pneumonia)   CKD (chronic kidney disease), stage IV (HCC)   AKI (acute kidney injury) ([place])   Sepsis ([place])   Atrial fibrillation with RVR ([place])   Delirium  Acute respiratory failure with hypoxia/Covid pneumonia/suspected bacterial superinfection COVID-19 Labs -She is unvaccinated -Chest x-ray significant for bilateral multifocal pneumonia -Is with significant hypoxia, severe of pneumonia, with significant oxygen requirement, she is on 6 L high flow nasal cannula this morning. -Treated with IV Solu-Medrol, been tapered off, last dose is today -Treated with antibiotics for suspicion for bacterial infection, cultures no growth to date. -She was encouraged with incentive  spirometry, flutter valve, out of bed to chair  Recent Labs    08/06/20 0130 08/07/20 0230  DDIMER 1.70* 1.76*  FERRITIN 1,758* 1,604*  LDH 240* 267*  CRP 1.5* 1.1*    Lab Results  Component Value Date   SARSCOV2NAA POSITIVE (A) 07/25/2020    Sepsis pneumonia (Covid 19/suspected CAP) -On admission criteria for sepsis tachycardia, tachypnea, leukocytosis -Cultures obtained, placed on antibiotics.  All physiologic signs of sepsis have resolved.  Antibiotic course completed.  Elevated D-dimer -Due to COVID-19 infection, as well she is on full anticoagulation with Eliquis.  Acute on CKD stage IV (baseline Cr 2.4-2.7) -On presentation Cr 6.8 not improving -Agement per renal, started on HD this admission  Anion gap metabolic acidosis Resolved.  On dialysis now.  Chronic diastolic CHF -Volume management with dialysis Filed Weights   08/07/20 1440 08/07/20 1831 08/08/20 0342  Weight: 67.1 kg 65.1 kg 62.9 kg   A. fib with RVR -CHADS2Vasc score is 7 (HTN, age x 2, DM, TIA x2, gender),  -Cardiology consulted -She is on Eliquis for anticoagulation -On metoprolol and amiodarone for heart rate control  Essential HTN Controlled.  Continue current management.  Pulmonary HTN -See CHF  Acute metabolic encephalopathy -Has significantly improved, but she does remember some hospital delirium, this is improving on Seroquel  Hx TIA Transitioned from heparin to Eliquis.  No aspirin. -Lipitor 20 mg daily -12/22 LDL = 57: Goal LDL <70   DM type II uncontrolled with Hyperglycemia Controlled.  Continue current regimen.  Anemia -Anemia of acute illness and chronic kidney disease, hemoglobin 6.9 12/30 1 AM, she was transfused PRBC with good response,  as well she is on Aranesp per renal.  DVT prophylaxis: Eliquis Code Status: Full Family Communication: D/W daughter [name] 08/07/2020 Status is: Inpatient    Dispo: The patient is from:               Anticipated d/c is to:  SNF              Anticipated d/c date is: When bed is available and insurance authorization obtained.              Patient currently unstable       Consultants:  Nephrology Cardiology  Procedures/Significant Events:  12/22 nontunneled HD catheter terminating within the superior aspect RIGHT atrium 12/23 echocardiogram limited;LVEF= 55 to 60%.  -Grade I diastolic dysfunction  Right Ventricle: Mldly elevated pulmonary artery systolic pressure. estimated  right ventricular systolic pressure is 88.4 mmHg.    I have personally reviewed and interpreted all radiology studies and my findings are as above.  VENTILATOR SETTINGS: HFNC 12/28 Flow; 4 L/min SPO2; percent   Cultures 12/18 blood RIGHT AC negative 12/18 MRSA by PCR negative 12/18 blood right hand negative final x2    Antimicrobials: Anti-infectives (From admission, onward)   Start     Ordered Stop   08/04/20 1733  vancomycin (VANCOCIN) 1-5 GM/200ML-% IVPB       Note to Pharmacy: Fredric Dine   : cabinet override   08/04/20 1733 08/05/20 0544   08/04/20 1445  vancomycin (VANCOCIN) IVPB 1000 mg/200 mL premix        08/04/20 1356 08/04/20 1911   07/26/20 1000  cefTRIAXone (ROCEPHIN) 1 g in sodium chloride 0.9 % 100 mL IVPB        07/25/20 1138 07/29/20 0911   07/26/20 1000  doxycycline (VIBRAMYCIN) 100 mg in sodium chloride 0.9 % 250 mL IVPB        07/25/20 1138 07/27/20 2335   07/25/20 0500  levofloxacin (LEVAQUIN) IVPB 750 mg        07/25/20 0453 07/25/20 0824         Objective: Vitals:   08/08/20 0342 08/08/20 0754 08/08/20 0826 08/08/20 1148  BP: (!) 158/70 (!) 169/70  109/66  Pulse: 86 88  83  Resp: 18 18  19   Temp: 98 F (36.7 C) (!) 97.3 F (36.3 C)  97.9 F (36.6 C)  TempSrc: Axillary Oral  Oral  SpO2: 94% 98% 95% 99%  Weight: 62.9 kg     Height:        Intake/Output Summary (Last 24 hours) at 08/08/2020 1433 Last data filed at 08/08/2020 1211 Gross per 24 hour  Intake 552 ml  Output 2005  ml  Net -1453 ml   Filed Weights   08/07/20 1440 08/07/20 1831 08/08/20 0342  Weight: 67.1 kg 65.1 kg 62.9 kg   Physical exam:  Awake Alert, look confused, but pleasant and answering most of questions appropriately, and follow commands ,no new F.N deficits, Normal affect Symmetrical Chest wall movement, Good air movement bilaterally, CTAB RRR,No Gallops,Rubs or new Murmurs, No Parasternal Heave +ve B.Sounds, Abd Soft, No tenderness, No rebound - guarding or rigidity. No Cyanosis, Clubbing or edema, No new Rash or bruise      CBC: Recent Labs  Lab 08/04/20 0410 08/05/20 1012 08/06/20 0130 08/07/20 0230 08/07/20 0647 08/08/20 0316  WBC 21.9* 16.0* 15.8* 14.6* 15.6* 14.5*  NEUTROABS 20.6* 14.5* 14.7* 13.8*  --  12.8*  HGB 8.1* 7.5* 7.0* 6.9* 7.5* 10.3*  HCT 24.9* 23.9* 22.2* 21.7* 24.2* 30.3*  MCV 69.7* 70.5* 71.2* 71.4* 71.8* 73.2*  PLT 285 228 226 188 228 376   Basic Metabolic Panel: Recent Labs  Lab 08/03/20 0254 08/03/20 1108 08/04/20 0410 08/05/20 1012 08/06/20 0130 08/07/20 0230  NA  --  137 139 136 140 136  K  --  3.9 4.1 3.9 3.9 4.5  CL  --  98 99 100 103 100  CO2  --  27 26 23 26 24   GLUCOSE  --  313* 91 167* 48* 123*  BUN  --  38* 44* 37* 46* 65*  CREATININE  --  4.06* 4.84* 3.91* 4.65* 5.89*  CALCIUM  --  8.4* 8.8* 8.5* 8.6* 8.3*  MG 1.9  --  2.1 2.0 2.1 2.2  PHOS 4.0  --  4.4 4.9* 4.9* 6.7*   GFR: Estimated Creatinine Clearance: 6.3 mL/min (A) (by C-G formula based on SCr of 5.89 mg/dL (H)). Liver Function Tests: Recent Labs  Lab 08/03/20 1108 08/04/20 0410 08/05/20 1012 08/06/20 0130 08/07/20 0230  AST 19 19 17  13* 14*  ALT 18 18 18 14 17   ALKPHOS 72 75 72 63 64  BILITOT 0.2* 0.6 0.7 0.6 0.3  PROT 5.9* 6.4* 5.5* 5.1* 5.0*  ALBUMIN 2.4* 2.6* 2.4* 2.2* 2.3*   No results for input(s): LIPASE, AMYLASE in the last 168 hours. No results for input(s): AMMONIA in the last 168 hours. Coagulation Profile: No results for input(s): INR, PROTIME  in the last 168 hours. Cardiac Enzymes: No results for input(s): CKTOTAL, CKMB, CKMBINDEX, TROPONINI in the last 168 hours. BNP (last 3 results) No results for input(s): PROBNP in the last 8760 hours. HbA1C: No results for input(s): HGBA1C in the last 72 hours. CBG: Recent Labs  Lab 08/07/20 0905 08/07/20 1233 08/07/20 2030 08/08/20 0758 08/08/20 1151  GLUCAP 149* 171* 159* 163* 98   Lipid Profile: No results for input(s): CHOL, HDL, LDLCALC, TRIG, CHOLHDL, LDLDIRECT in the last 72 hours. Thyroid Function Tests: No results for input(s): TSH, T4TOTAL, FREET4, T3FREE, THYROIDAB in the last 72 hours. Anemia Panel: Recent Labs    08/06/20 0130 08/07/20 0230  FERRITIN 1,758* 1,604*   Sepsis Labs: No results for input(s): PROCALCITON, LATICACIDVEN in the last 168 hours.  No results found for this or any previous visit (from the past 240 hour(s)).       Radiology Studies: No results found.      Scheduled Meds: . sodium chloride   Intravenous Once  . sodium chloride   Intravenous Once  . amiodarone  200 mg Oral Daily  . amLODipine  10 mg Oral Daily  . apixaban  2.5 mg Oral BID  . vitamin C  500 mg Oral Daily  . atorvastatin  20 mg Oral q1800  . chlorhexidine  15 mL Mouth Rinse BID  . Chlorhexidine Gluconate Cloth  6 each Topical Q0600  . cloNIDine  0.1 mg Oral TID  . darbepoetin (ARANESP) injection - DIALYSIS  150 mcg Intravenous Q Fri-HD  . haloperidol lactate  2 mg Intravenous Once  . insulin aspart  0-5 Units Subcutaneous QHS  . insulin aspart  0-9 Units Subcutaneous TID WC  . insulin detemir  7 Units Subcutaneous BID  . linagliptin  5 mg Oral Daily  . mouth rinse  15 mL Mouth Rinse q12n4p  . methylPREDNISolone (SOLU-MEDROL) injection  40 mg Intravenous Daily  . metoprolol tartrate  12.5 mg Oral BID  . pantoprazole  40 mg Oral Daily  . QUEtiapine  25 mg Oral QHS  .  sodium chloride flush  10-40 mL Intracatheter Q12H  . sodium chloride flush  3 mL  Intravenous Q12H  . zinc sulfate  220 mg Oral Daily   Continuous Infusions:    LOS: 14 days     Phillips Climes, MD [facility]  If 7PM-7AM, please contact night-coverage [email or web address] Password [facility] 08/08/2020, 2:33 PM

## 2020-08-09 DIAGNOSIS — E1121 Type 2 diabetes mellitus with diabetic nephropathy: Secondary | ICD-10-CM | POA: Diagnosis not present

## 2020-08-09 DIAGNOSIS — U071 COVID-19: Secondary | ICD-10-CM | POA: Diagnosis not present

## 2020-08-09 DIAGNOSIS — J9601 Acute respiratory failure with hypoxia: Secondary | ICD-10-CM | POA: Diagnosis not present

## 2020-08-09 LAB — GLUCOSE, CAPILLARY
Glucose-Capillary: 42 mg/dL — CL (ref 70–99)
Glucose-Capillary: 63 mg/dL — ABNORMAL LOW (ref 70–99)
Glucose-Capillary: 77 mg/dL (ref 70–99)
Glucose-Capillary: 139 mg/dL — ABNORMAL HIGH (ref 70–99)
Glucose-Capillary: 172 mg/dL — ABNORMAL HIGH (ref 70–99)
Glucose-Capillary: 120 mg/dL — ABNORMAL HIGH (ref 70–99)

## 2020-08-09 NOTE — Progress Notes (Signed)
Patient found to be mouth breathing while asleep with oxygen levels sustaining in the high 80's. Patient refuses to wear any oxygen. When awake and stimulated patient oxygen levels return to mid 90s.

## 2020-08-09 NOTE — Progress Notes (Signed)
PROGRESS NOTE    Agatha Duplechain Dao  GQQ:[id number] DOB: 1936-06-13 DOA: 07/25/2020 PCP: [name], MD     Brief Narrative:  [name] [name] is an 85 year old female with history of RA, CKD 4, DM 2, HTN, HLD, OSA on CPAP, TIA, chronic diastolic CHF.   To the hospital with 5 days of cough, lethargy, confusion. Was found to be hypoxic, tested positive for Covid and was admitted to the hospital. She was also found to have acute kidney injury. She was also found to have A. fib with RVR. Cardiology and nephrology were consulted on admission. Renal function has not improved and the patient is transferred to [facility] in anticipation of dialysis initiation.   Subjective:  Patient seen and examined, she herself denies any complaints , she had hypoglycemia this am.  Assessment & Plan:  Covid vaccination; unvaccinated   Principal Problem:   Acute hypoxemic respiratory failure due to COVID-19 [facility]) Active Problems:   Essential hypertension   HLD (hyperlipidemia)   DM (diabetes mellitus), type 2 with renal complications ([place])   Chronic diastolic HF (heart failure) ([place])   CAP (community acquired pneumonia)   CKD (chronic kidney disease), stage IV (HCC)   AKI (acute kidney injury) ([place])   Sepsis ([place])   Atrial fibrillation with RVR ([place])   Delirium  Acute respiratory failure with hypoxia/Covid pneumonia/suspected bacterial superinfection COVID-19 Labs -She is unvaccinated -Chest x-ray significant for bilateral multifocal pneumonia -Is with significant hypoxia, severe of pneumonia, with significant oxygen requirement, significantly improved, this morning she is on 2 L oxygen. -Treated with IV Solu-Medrol, been tapered off, last dose 08/08/2020. -Treated with antibiotics for suspicion for bacterial infection, cultures no growth to date. -She was encouraged with incentive spirometry, flutter valve, out of bed to chair  Recent Labs    08/07/20 0230  DDIMER 1.76*   FERRITIN 1,604*  LDH 267*  CRP 1.1*    Lab Results  Component Value Date   SARSCOV2NAA POSITIVE (A) 07/25/2020    Sepsis pneumonia (Covid 19/suspected CAP) -On admission criteria for sepsis tachycardia, tachypnea, leukocytosis -Cultures obtained, placed on antibiotics.  All physiologic signs of sepsis have resolved.  Antibiotic course completed.  Elevated D-dimer -Due to COVID-19 infection, as well she is on full anticoagulation with Eliquis.  Acute on CKD stage IV (baseline Cr 2.4-2.7) -On presentation Cr 6.8 not improving -Agement per renal, started on HD this admission  Anion gap metabolic acidosis Resolved.  On dialysis now.  Chronic diastolic CHF -Volume management with dialysis Filed Weights   08/08/20 0342 08/08/20 2338 08/09/20 0401  Weight: 62.9 kg 65.8 kg 65.8 kg   A. fib with RVR -CHADS2Vasc score is 7 (HTN, age x 2, DM, TIA x2, gender),  -Cardiology consulted -She is on Eliquis for anticoagulation -On metoprolol and amiodarone for heart rate control  Essential HTN Controlled.  Continue current management.  Pulmonary HTN -See CHF  Acute metabolic encephalopathy -Has significantly improved, but she does remember some hospital delirium, this is improving on Seroquel  Hx TIA Transitioned from heparin to Eliquis.  No aspirin. -Lipitor 20 mg daily -12/22 LDL = 57: Goal LDL <70   DM type II uncontrolled with Hyperglycemia -Had hypoglycemia this a.m., overall she has difficulty eating and chewing given she is edentulous, so we will change her diet to pured, and will discontinue Tradjenta and Levemir given she is off insulin currently, will keep only on sliding scale.  Anemia -Anemia of acute illness and chronic kidney disease, hemoglobin 6.9 12/30 1 AM,  she was transfused PRBC with good response, as well she is on Aranesp per renal.  DVT prophylaxis: Eliquis Code Status: Full Family Communication: D/W daughter [name] 08/07/2020 Status is:  Inpatient    Dispo: The patient is from:               Anticipated d/c is to: SNF              Anticipated d/c date is: When bed is available and insurance authorization obtained.              Patient currently unstable       Consultants:  Nephrology Cardiology  Procedures/Significant Events:  12/22 nontunneled HD catheter terminating within the superior aspect RIGHT atrium 12/23 echocardiogram limited;LVEF= 55 to 60%.  -Grade I diastolic dysfunction  Right Ventricle: Mldly elevated pulmonary artery systolic pressure. estimated  right ventricular systolic pressure is 31.4 mmHg.    I have personally reviewed and interpreted all radiology studies and my findings are as above.  VENTILATOR SETTINGS: HFNC 12/28 Flow; 4 L/min SPO2; percent   Cultures 12/18 blood RIGHT AC negative 12/18 MRSA by PCR negative 12/18 blood right hand negative final x2    Antimicrobials: Anti-infectives (From admission, onward)   Start     Ordered Stop   08/04/20 1733  vancomycin (VANCOCIN) 1-5 GM/200ML-% IVPB       Note to Pharmacy: Fredric Dine   : cabinet override   08/04/20 1733 08/05/20 0544   08/04/20 1445  vancomycin (VANCOCIN) IVPB 1000 mg/200 mL premix        08/04/20 1356 08/04/20 1911   07/26/20 1000  cefTRIAXone (ROCEPHIN) 1 g in sodium chloride 0.9 % 100 mL IVPB        07/25/20 1138 07/29/20 0911   07/26/20 1000  doxycycline (VIBRAMYCIN) 100 mg in sodium chloride 0.9 % 250 mL IVPB        07/25/20 1138 07/27/20 2335   07/25/20 0500  levofloxacin (LEVAQUIN) IVPB 750 mg        07/25/20 0453 07/25/20 0824         Objective: Vitals:   08/08/20 2338 08/09/20 0401 08/09/20 0756 08/09/20 1207  BP: (!) 131/55 (!) 94/50 130/63 (!) 119/46  Pulse: 65 62 70 82  Resp: 18 18 18 19   Temp: 98.2 F (36.8 C) 98.2 F (36.8 C) 98.3 F (36.8 C) 98.2 F (36.8 C)  TempSrc: Oral Oral Oral Oral  SpO2: 96% 95% 94% 92%  Weight: 65.8 kg 65.8 kg    Height:       No intake or output  data in the 24 hours ending 08/09/20 1320 Filed Weights   08/08/20 0342 08/08/20 2338 08/09/20 0401  Weight: 62.9 kg 65.8 kg 65.8 kg   Physical exam:  Awake Alert, Oriented , appropriate and conversant today  symmetrical Chest wall movement, Good air movement bilaterally, CTAB RRR,No Gallops,Rubs or new Murmurs, No Parasternal Heave +ve B.Sounds, Abd Soft, No tenderness, No rebound - guarding or rigidity. No Cyanosis, Clubbing or edema, No new Rash or bruise     CBC: Recent Labs  Lab 08/04/20 0410 08/05/20 1012 08/06/20 0130 08/07/20 0230 08/07/20 0647 08/08/20 0316  WBC 21.9* 16.0* 15.8* 14.6* 15.6* 14.5*  NEUTROABS 20.6* 14.5* 14.7* 13.8*  --  12.8*  HGB 8.1* 7.5* 7.0* 6.9* 7.5* 10.3*  HCT 24.9* 23.9* 22.2* 21.7* 24.2* 30.3*  MCV 69.7* 70.5* 71.2* 71.4* 71.8* 73.2*  PLT 285 228 226 188 228 970   Basic Metabolic Panel: Recent Labs  Lab 08/03/20 0254 08/03/20 1108 08/04/20 0410 08/05/20 1012 08/06/20 0130 08/07/20 0230  NA  --  137 139 136 140 136  K  --  3.9 4.1 3.9 3.9 4.5  CL  --  98 99 100 103 100  CO2  --  27 26 23 26 24   GLUCOSE  --  313* 91 167* 48* 123*  BUN  --  38* 44* 37* 46* 65*  CREATININE  --  4.06* 4.84* 3.91* 4.65* 5.89*  CALCIUM  --  8.4* 8.8* 8.5* 8.6* 8.3*  MG 1.9  --  2.1 2.0 2.1 2.2  PHOS 4.0  --  4.4 4.9* 4.9* 6.7*   GFR: Estimated Creatinine Clearance: 6.4 mL/min (A) (by C-G formula based on SCr of 5.89 mg/dL (H)). Liver Function Tests: Recent Labs  Lab 08/03/20 1108 08/04/20 0410 08/05/20 1012 08/06/20 0130 08/07/20 0230  AST 19 19 17  13* 14*  ALT 18 18 18 14 17   ALKPHOS 72 75 72 63 64  BILITOT 0.2* 0.6 0.7 0.6 0.3  PROT 5.9* 6.4* 5.5* 5.1* 5.0*  ALBUMIN 2.4* 2.6* 2.4* 2.2* 2.3*   No results for input(s): LIPASE, AMYLASE in the last 168 hours. No results for input(s): AMMONIA in the last 168 hours. Coagulation Profile: No results for input(s): INR, PROTIME in the last 168 hours. Cardiac Enzymes: No results for input(s):  CKTOTAL, CKMB, CKMBINDEX, TROPONINI in the last 168 hours. BNP (last 3 results) No results for input(s): PROBNP in the last 8760 hours. HbA1C: No results for input(s): HGBA1C in the last 72 hours. CBG: Recent Labs  Lab 08/08/20 2336 08/09/20 0758 08/09/20 0835 08/09/20 0930 08/09/20 1210  GLUCAP 119* 42* 63* 77 139*   Lipid Profile: No results for input(s): CHOL, HDL, LDLCALC, TRIG, CHOLHDL, LDLDIRECT in the last 72 hours. Thyroid Function Tests: No results for input(s): TSH, T4TOTAL, FREET4, T3FREE, THYROIDAB in the last 72 hours. Anemia Panel: Recent Labs    08/07/20 0230  FERRITIN 1,604*   Sepsis Labs: No results for input(s): PROCALCITON, LATICACIDVEN in the last 168 hours.  No results found for this or any previous visit (from the past 240 hour(s)).       Radiology Studies: No results found.      Scheduled Meds: . sodium chloride   Intravenous Once  . sodium chloride   Intravenous Once  . amiodarone  200 mg Oral Daily  . amLODipine  10 mg Oral Daily  . apixaban  2.5 mg Oral BID  . vitamin C  500 mg Oral Daily  . atorvastatin  20 mg Oral q1800  . chlorhexidine  15 mL Mouth Rinse BID  . Chlorhexidine Gluconate Cloth  6 each Topical Q0600  . cloNIDine  0.1 mg Oral TID  . darbepoetin (ARANESP) injection - DIALYSIS  150 mcg Intravenous Q Fri-HD  . haloperidol lactate  2 mg Intravenous Once  . insulin aspart  0-5 Units Subcutaneous QHS  . insulin aspart  0-9 Units Subcutaneous TID WC  . insulin detemir  7 Units Subcutaneous BID  . linagliptin  5 mg Oral Daily  . mouth rinse  15 mL Mouth Rinse q12n4p  . metoprolol tartrate  12.5 mg Oral BID  . pantoprazole  40 mg Oral Daily  . QUEtiapine  25 mg Oral QHS  . sodium chloride flush  10-40 mL Intracatheter Q12H  . sodium chloride flush  3 mL Intravenous Q12H  . zinc sulfate  220 mg Oral Daily   Continuous Infusions:    LOS: 15  days     Phillips Climes, MD [facility]  If 7PM-7AM, please  contact night-coverage [email or web address] Password [email or web address] 08/09/2020, 1:20 PM

## 2020-08-09 NOTE — Progress Notes (Signed)
[facility] NEPHROLOGY PROGRESS NOTE  Assessment/ Plan:  # AKIon CKD stage IV, dialysis dependent and now progressed to new ESRD: In setting of sepsis due to covid-19 and CAP. Baseline Scr 2.7-3. Renal US without obstruction but chronicity. No improvement with IVF's and rising BUN/Cr.  Started dialysis on 12/22 after placement of temporary HD catheter.  Since then she has been tolerating dialysis well.   The HD catheter was converted to tunnel on 12/28. Dialysis maintaining MWF schedule. She is accepted at [facility] starting on Monday but may not be medically stable -Continue dialysis MWF Plan for permanent access placement with AVF when Covid negative, as outpatient.  #Acute hypoxemic respiratory failure due to covid-19 and community acquired PNA-continue management per primary team  # Sepsis due to covid-19 and CAP- hypoxia worsening today. Mgmt per primary team  # Atrial fib with RVR- started on amiodarone per Cardiology.  # Acute metabolic encephalopathy- likely multifactorial with AKI, covid-19, hypoxia, and sepsis.  Overall stable  # Anemia of acute illness and CKD stage IV- follow H/H and iron stores. -  Aranesp added.  Transfusion as needed, hemoglobin 10.3 today.  # HTN-blood pressure acceptable at this time  Subjective: Chart review only today.  Stable over the past 24 hours.  No longer on high flow nasal cannula.  Patient remains altered and is removing oxygen intermittently.  Saturations are acceptable.  Objective Vital signs in last 24 hours: Vitals:   08/08/20 2006 08/08/20 2338 08/09/20 0401 08/09/20 0756  BP: 136/77 (!) 131/55 (!) 94/50 130/63  Pulse: 87 65 62 70  Resp: 19 18 18 18   Temp: 98 F (36.7 C) 98.2 F (36.8 C) 98.2 F (36.8 C) 98.3 F (36.8 C)  TempSrc: Oral Oral Oral Oral  SpO2: 100% 96% 95% 94%  Weight:  65.8 kg 65.8 kg   Height:       Weight change: -1.3 kg  Intake/Output Summary (Last 24 hours) at 08/09/2020  8101 Last data filed at 08/08/2020 1211 Gross per 24 hour  Intake 237 ml  Output --  Net 237 ml       Labs: Basic Metabolic Panel: Recent Labs  Lab 08/05/20 1012 08/06/20 0130 08/07/20 0230  NA 136 140 136  K 3.9 3.9 4.5  CL 100 103 100  CO2 23 26 24   GLUCOSE 167* 48* 123*  BUN 37* 46* 65*  CREATININE 3.91* 4.65* 5.89*  CALCIUM 8.5* 8.6* 8.3*  PHOS 4.9* 4.9* 6.7*   Liver Function Tests: Recent Labs  Lab 08/05/20 1012 08/06/20 0130 08/07/20 0230  AST 17 13* 14*  ALT 18 14 17   ALKPHOS 72 63 64  BILITOT 0.7 0.6 0.3  PROT 5.5* 5.1* 5.0*  ALBUMIN 2.4* 2.2* 2.3*   No results for input(s): LIPASE, AMYLASE in the last 168 hours. No results for input(s): AMMONIA in the last 168 hours. CBC: Recent Labs  Lab 08/05/20 1012 08/06/20 0130 08/07/20 0230 08/07/20 0647 08/08/20 0316  WBC 16.0* 15.8* 14.6* 15.6* 14.5*  NEUTROABS 14.5* 14.7* 13.8*  --  12.8*  HGB 7.5* 7.0* 6.9* 7.5* 10.3*  HCT 23.9* 22.2* 21.7* 24.2* 30.3*  MCV 70.5* 71.2* 71.4* 71.8* 73.2*  PLT 228 226 188 228 202   Cardiac Enzymes: No results for input(s): CKTOTAL, CKMB, CKMBINDEX, TROPONINI in the last 168 hours. CBG: Recent Labs  Lab 08/08/20 1720 08/08/20 2005 08/08/20 2336 08/09/20 0758 08/09/20 0835  GLUCAP 220* 182* 119* 42* 63*    Iron Studies:  Recent Labs  08/07/20 0230  FERRITIN 1,604*   Studies/Results: No results found.  Medications: Infusions:   Scheduled Medications: . sodium chloride   Intravenous Once  . sodium chloride   Intravenous Once  . amiodarone  200 mg Oral Daily  . amLODipine  10 mg Oral Daily  . apixaban  2.5 mg Oral BID  . vitamin C  500 mg Oral Daily  . atorvastatin  20 mg Oral q1800  . chlorhexidine  15 mL Mouth Rinse BID  . Chlorhexidine Gluconate Cloth  6 each Topical Q0600  . cloNIDine  0.1 mg Oral TID  . darbepoetin (ARANESP) injection - DIALYSIS  150 mcg Intravenous Q Fri-HD  . haloperidol lactate  2 mg Intravenous Once  . insulin aspart   0-5 Units Subcutaneous QHS  . insulin aspart  0-9 Units Subcutaneous TID WC  . insulin detemir  7 Units Subcutaneous BID  . linagliptin  5 mg Oral Daily  . mouth rinse  15 mL Mouth Rinse q12n4p  . metoprolol tartrate  12.5 mg Oral BID  . pantoprazole  40 mg Oral Daily  . QUEtiapine  25 mg Oral QHS  . sodium chloride flush  10-40 mL Intracatheter Q12H  . sodium chloride flush  3 mL Intravenous Q12H  . zinc sulfate  220 mg Oral Daily    have reviewed scheduled and prn medications.  Physical Exam: Not examined today  Reesa Chew 08/09/2020,9:22 AM  LOS: 15 days

## 2020-08-10 DIAGNOSIS — I4891 Unspecified atrial fibrillation: Secondary | ICD-10-CM | POA: Diagnosis not present

## 2020-08-10 DIAGNOSIS — U071 COVID-19: Secondary | ICD-10-CM | POA: Diagnosis not present

## 2020-08-10 DIAGNOSIS — J9601 Acute respiratory failure with hypoxia: Secondary | ICD-10-CM | POA: Diagnosis not present

## 2020-08-10 DIAGNOSIS — E1121 Type 2 diabetes mellitus with diabetic nephropathy: Secondary | ICD-10-CM | POA: Diagnosis not present

## 2020-08-10 LAB — RENAL FUNCTION PANEL
Albumin: 2.2 g/dL — ABNORMAL LOW (ref 3.5–5.0)
Anion gap: 14 (ref 5–15)
BUN: 62 mg/dL — ABNORMAL HIGH (ref 8–23)
CO2: 24 mmol/L (ref 22–32)
Calcium: 7.9 mg/dL — ABNORMAL LOW (ref 8.9–10.3)
Chloride: 97 mmol/L — ABNORMAL LOW (ref 98–111)
Creatinine, Ser: 6.08 mg/dL — ABNORMAL HIGH (ref 0.44–1.00)
GFR, Estimated: 6 mL/min — ABNORMAL LOW (ref >60)
Glucose, Bld: 36 mg/dL — CL (ref 70–99)
Phosphorus: 5.9 mg/dL — ABNORMAL HIGH (ref 2.5–4.6)
Potassium: 3.5 mmol/L (ref 3.5–5.1)
Sodium: 135 mmol/L (ref 135–145)

## 2020-08-10 LAB — GLUCOSE, CAPILLARY
Glucose-Capillary: 110 mg/dL — ABNORMAL HIGH (ref 70–99)
Glucose-Capillary: 118 mg/dL — ABNORMAL HIGH (ref 70–99)
Glucose-Capillary: 123 mg/dL — ABNORMAL HIGH (ref 70–99)
Glucose-Capillary: 189 mg/dL — ABNORMAL HIGH (ref 70–99)
Glucose-Capillary: 231 mg/dL — ABNORMAL HIGH (ref 70–99)
Glucose-Capillary: 247 mg/dL — ABNORMAL HIGH (ref 70–99)
Glucose-Capillary: 38 mg/dL — CL (ref 70–99)

## 2020-08-10 LAB — CBC
HCT: 30.2 % — ABNORMAL LOW (ref 36.0–46.0)
Hemoglobin: 10.1 g/dL — ABNORMAL LOW (ref 12.0–15.0)
MCH: 25.1 pg — ABNORMAL LOW (ref 26.0–34.0)
MCHC: 33.4 g/dL (ref 30.0–36.0)
MCV: 74.9 fL — ABNORMAL LOW (ref 80.0–100.0)
Platelets: 118 10*3/uL — ABNORMAL LOW (ref 150–400)
RBC: 4.03 MIL/uL (ref 3.87–5.11)
RDW: 21.4 % — ABNORMAL HIGH (ref 11.5–15.5)
WBC: 10.2 10*3/uL (ref 4.0–10.5)
nRBC: 0.2 % (ref 0.0–0.2)

## 2020-08-10 MED ORDER — HEPARIN SODIUM (PORCINE) 1000 UNIT/ML IJ SOLN
INTRAMUSCULAR | Status: AC
  Administered 2020-08-10: 3200 [IU] via INTRAVENOUS
  Filled 2020-08-10: qty 4

## 2020-08-10 MED ORDER — DEXTROSE 50 % IV SOLN
25.0000 g | INTRAVENOUS | Status: AC
  Administered 2020-08-10: 25 g via INTRAVENOUS

## 2020-08-10 MED ORDER — AMLODIPINE BESYLATE 5 MG PO TABS
5.0000 mg | ORAL_TABLET | Freq: Every day | ORAL | Status: DC
Start: 2020-08-11 — End: 2020-08-14
  Administered 2020-08-11 – 2020-08-14 (×3): 5 mg via ORAL
  Filled 2020-08-10 (×3): qty 1

## 2020-08-10 MED ORDER — HEPARIN SODIUM (PORCINE) 1000 UNIT/ML IJ SOLN: 1000.0000 [IU] | INTRAMUSCULAR | Status: DC | PRN

## 2020-08-10 NOTE — Progress Notes (Signed)
OT Cancellation Note  Patient Details Name: [name] [name] MRN: [id number] DOB: April 13, 1936   Cancelled Treatment:    Reason Eval/Treat Not Completed: Patient at procedure or test/ unavailable (HD), will follow up for OT treatment as able.  Lou Cal, OT Acute Rehabilitation Services Pager [phone] Office [phone]   Raymondo Band 08/10/2020, 4:07 PM

## 2020-08-10 NOTE — Progress Notes (Signed)
PROGRESS NOTE    [name] [name]  QZR:[id number] DOB: 1935/09/13 DOA: 07/25/2020 PCP: [name], MD     Brief Narrative:  [name] [name] is an 85 year old female with history of RA, CKD 4, DM 2, HTN, HLD, OSA on CPAP, TIA, chronic diastolic CHF.   To the hospital with 5 days of cough, lethargy, confusion. Was found to be hypoxic, tested positive for Covid and was admitted to the hospital. She was also found to have acute kidney injury. She was also found to have A. fib with RVR. Cardiology and nephrology were consulted on admission. Renal function has not improved and the patient is transferred to [facility] in anticipation of dialysis initiation.   Subjective:  Patient seen and examined, she herself denies any complaints , she had an episode of hypoglycemia this a.m., resolved after drinking orange juice.  Assessment & Plan:  Covid vaccination; unvaccinated   Principal Problem:   Acute hypoxemic respiratory failure due to COVID-19 [facility]) Active Problems:   Essential hypertension   HLD (hyperlipidemia)   DM (diabetes mellitus), type 2 with renal complications (HCC)   Chronic diastolic HF (heart failure) (HCC)   CAP (community acquired pneumonia)   CKD (chronic kidney disease), stage IV (HCC)   AKI (acute kidney injury) ([place])   Sepsis ([place])   Atrial fibrillation with RVR ([place])   Delirium  Acute respiratory failure with hypoxia/Covid pneumonia/suspected bacterial superinfection COVID-19 Labs -She is unvaccinated -Chest x-ray significant for bilateral multifocal pneumonia -Is with significant hypoxia, severe of pneumonia, with significant oxygen requirement, significantly improved, this morning she is room air -Treated with IV Solu-Medrol, been tapered off, last dose 08/08/2020. -Treated with antibiotics for suspicion for bacterial infection, cultures no growth to date. -She was encouraged with incentive spirometry, flutter valve, out of bed to chair  No  results for input(s): DDIMER, FERRITIN, LDH, CRP in the last 72 hours.  Lab Results  Component Value Date   SARSCOV2NAA POSITIVE (A) 07/25/2020    Sepsis pneumonia (Covid 19/suspected CAP) -On admission criteria for sepsis tachycardia, tachypnea, leukocytosis -Cultures obtained, placed on antibiotics.  All physiologic signs of sepsis have resolved.  Antibiotic course completed.  Elevated D-dimer -Due to COVID-19 infection, as well she is on full anticoagulation with Eliquis.  Acute on CKD stage IV (baseline Cr 2.4-2.7) -On presentation Cr 6.8 not improving -Agement per renal, started on HD this admission  Anion gap metabolic acidosis Resolved.  On dialysis now.  Chronic diastolic CHF -Volume management with dialysis Filed Weights   08/08/20 2338 08/09/20 0401 08/10/20 0500  Weight: 65.8 kg 65.8 kg 65.4 kg   A. fib with RVR -CHADS2Vasc score is 7 (HTN, age x 2, DM, TIA x2, gender),  -Cardiology consulted -She is on Eliquis for anticoagulation -On metoprolol and amiodarone for heart rate control  Essential HTN Controlled.  Continue current management.  Pulmonary HTN -See CHF  Acute metabolic encephalopathy -Has significantly improved, but she does remember some hospital delirium, this is improving on Seroquel  Hx TIA Transitioned from heparin to Eliquis.  No aspirin. -Lipitor 20 mg daily -12/22 LDL = 57: Goal LDL <70   DM type II uncontrolled with Hyperglycemia -She is having recurrent hypoglycemia, poor appetite as she is missing her dentures, she is currently on pured diet . -Discontinue her Tradjenta yesterday, she is glycemic this a.m., so I have stopped her Levemir, continue with insulin sliding scale .  Anemia -Anemia of acute illness and chronic kidney disease, hemoglobin 6.9 12/30 1 AM, she was  transfused PRBC with good response, as well she is on Aranesp per renal.  DVT prophylaxis: Eliquis Code Status: Full Family Communication: D/W daughter [name]  08/07/2020 Status is: Inpatient    Dispo: The patient is from:               Anticipated d/c is to: SNF              Anticipated d/c date is: When bed is available and insurance authorization obtained.              Patient currently stable       Consultants:  Nephrology Cardiology  Procedures/Significant Events:  12/22 nontunneled HD catheter terminating within the superior aspect RIGHT atrium 12/23 echocardiogram limited;LVEF= 55 to 60%.  -Grade I diastolic dysfunction  Right Ventricle: Mldly elevated pulmonary artery systolic pressure. estimated  right ventricular systolic pressure is 42.7 mmHg.    I have personally reviewed and interpreted all radiology studies and my findings are as above.  VENTILATOR SETTINGS: HFNC 12/28 Flow; 4 L/min SPO2; percent   Cultures 12/18 blood RIGHT AC negative 12/18 MRSA by PCR negative 12/18 blood right hand negative final x2    Antimicrobials: Anti-infectives (From admission, onward)   Start     Ordered Stop   08/04/20 1733  vancomycin (VANCOCIN) 1-5 GM/200ML-% IVPB       Note to Pharmacy: Fredric Dine   : cabinet override   08/04/20 1733 08/05/20 0544   08/04/20 1445  vancomycin (VANCOCIN) IVPB 1000 mg/200 mL premix        08/04/20 1356 08/04/20 1911   07/26/20 1000  cefTRIAXone (ROCEPHIN) 1 g in sodium chloride 0.9 % 100 mL IVPB        07/25/20 1138 07/29/20 0911   07/26/20 1000  doxycycline (VIBRAMYCIN) 100 mg in sodium chloride 0.9 % 250 mL IVPB        07/25/20 1138 07/27/20 2335   07/25/20 0500  levofloxacin (LEVAQUIN) IVPB 750 mg        07/25/20 0453 07/25/20 0824         Objective: Vitals:   08/10/20 0600 08/10/20 0700 08/10/20 0732 08/10/20 1135  BP:   (!) 141/67 133/65  Pulse: 62 70 61 70  Resp:   19 18  Temp:   97.9 F (36.6 C) 98 F (36.7 C)  TempSrc:   Oral Oral  SpO2: 93% 92% 93% 95%  Weight:      Height:       No intake or output data in the 24 hours ending 08/10/20 1449 Filed Weights    08/08/20 2338 08/09/20 0401 08/10/20 0500  Weight: 65.8 kg 65.8 kg 65.4 kg   Physical exam:  Awake Alert, frail, deconditioned, no apparent distress Symmetrical Chest wall movement, Good air movement bilaterally, CTAB RRR,No Gallops,Rubs or new Murmurs, No Parasternal Heave +ve B.Sounds, Abd Soft, No tenderness, No rebound - guarding or rigidity. No Cyanosis, Clubbing or edema, No new Rash or bruise      CBC: Recent Labs  Lab 08/04/20 0410 08/05/20 1012 08/06/20 0130 08/07/20 0230 08/07/20 0647 08/08/20 0316 08/10/20 0408  WBC 21.9* 16.0* 15.8* 14.6* 15.6* 14.5* 10.2  NEUTROABS 20.6* 14.5* 14.7* 13.8*  --  12.8*  --   HGB 8.1* 7.5* 7.0* 6.9* 7.5* 10.3* 10.1*  HCT 24.9* 23.9* 22.2* 21.7* 24.2* 30.3* 30.2*  MCV 69.7* 70.5* 71.2* 71.4* 71.8* 73.2* 74.9*  PLT 285 228 226 188 228 202 062*   Basic Metabolic Panel: Recent Labs  Lab 08/04/20  0410 08/05/20 1012 08/06/20 0130 08/07/20 0230 08/10/20 0408  NA 139 136 140 136 135  K 4.1 3.9 3.9 4.5 3.5  CL 99 100 103 100 97*  CO2 26 23 26 24 24   GLUCOSE 91 167* 48* 123* 36*  BUN 44* 37* 46* 65* 62*  CREATININE 4.84* 3.91* 4.65* 5.89* 6.08*  CALCIUM 8.8* 8.5* 8.6* 8.3* 7.9*  MG 2.1 2.0 2.1 2.2  --   PHOS 4.4 4.9* 4.9* 6.7* 5.9*   GFR: Estimated Creatinine Clearance: 6.2 mL/min (A) (by C-G formula based on SCr of 6.08 mg/dL (H)). Liver Function Tests: Recent Labs  Lab 08/04/20 0410 08/05/20 1012 08/06/20 0130 08/07/20 0230 08/10/20 0408  AST 19 17 13* 14*  --   ALT 18 18 14 17   --   ALKPHOS 75 72 63 64  --   BILITOT 0.6 0.7 0.6 0.3  --   PROT 6.4* 5.5* 5.1* 5.0*  --   ALBUMIN 2.6* 2.4* 2.2* 2.3* 2.2*   No results for input(s): LIPASE, AMYLASE in the last 168 hours. No results for input(s): AMMONIA in the last 168 hours. Coagulation Profile: No results for input(s): INR, PROTIME in the last 168 hours. Cardiac Enzymes: No results for input(s): CKTOTAL, CKMB, CKMBINDEX, TROPONINI in the last 168 hours. BNP (last  3 results) No results for input(s): PROBNP in the last 8760 hours. HbA1C: No results for input(s): HGBA1C in the last 72 hours. CBG: Recent Labs  Lab 08/10/20 0610 08/10/20 0623 08/10/20 0747 08/10/20 1256 08/10/20 1308  GLUCAP 38* 247* 123* 118* 110*   Lipid Profile: No results for input(s): CHOL, HDL, LDLCALC, TRIG, CHOLHDL, LDLDIRECT in the last 72 hours. Thyroid Function Tests: No results for input(s): TSH, T4TOTAL, FREET4, T3FREE, THYROIDAB in the last 72 hours. Anemia Panel: No results for input(s): VITAMINB12, FOLATE, FERRITIN, TIBC, IRON, RETICCTPCT in the last 72 hours. Sepsis Labs: No results for input(s): PROCALCITON, LATICACIDVEN in the last 168 hours.  No results found for this or any previous visit (from the past 240 hour(s)).       Radiology Studies: No results found.      Scheduled Meds: . sodium chloride   Intravenous Once  . sodium chloride   Intravenous Once  . amiodarone  200 mg Oral Daily  . amLODipine  10 mg Oral Daily  . apixaban  2.5 mg Oral BID  . vitamin C  500 mg Oral Daily  . atorvastatin  20 mg Oral q1800  . chlorhexidine  15 mL Mouth Rinse BID  . Chlorhexidine Gluconate Cloth  6 each Topical Q0600  . cloNIDine  0.1 mg Oral TID  . darbepoetin (ARANESP) injection - DIALYSIS  150 mcg Intravenous Q Fri-HD  . haloperidol lactate  2 mg Intravenous Once  . heparin sodium (porcine)      . insulin aspart  0-5 Units Subcutaneous QHS  . insulin aspart  0-9 Units Subcutaneous TID WC  . mouth rinse  15 mL Mouth Rinse q12n4p  . metoprolol tartrate  12.5 mg Oral BID  . pantoprazole  40 mg Oral Daily  . QUEtiapine  25 mg Oral QHS  . sodium chloride flush  10-40 mL Intracatheter Q12H  . sodium chloride flush  3 mL Intravenous Q12H  . zinc sulfate  220 mg Oral Daily   Continuous Infusions:    LOS: 16 days     Phillips Climes, MD [facility]  If 7PM-7AM, please contact night-coverage [email or web address] Password [facility] 08/10/2020, 2:49  PM

## 2020-08-10 NOTE — Progress Notes (Signed)
[facility] PROGRESS NOTE  Assessment/ Plan:  # AKIon CKD stage IV, dialysis dependent and now progressed to new ESRD: In setting of sepsis due to covid-19 and CAP. Baseline Scr 2.7-3. Renal US without obstruction but chronicity. No improvement with IVF's and rising BUN/Cr.  Started dialysis on 12/22 after placement of temporary HD catheter.  Since then she has been tolerating dialysis well.   The HD catheter was converted to tunnel on 12/28. Dialysis maintaining MWF schedule. She is accepted at [facility] MWF -Continue dialysis MWF Plan for permanent access placement with AVF when Covid negative, as outpatient.  #Acute hypoxemic respiratory failure due to covid-19 and community acquired PNA-continue management per primary team  # Sepsis due to covid-19 and CAP-  Mgmt per primary team  # Atrial fib with RVR- started on amiodarone per Cardiology.  # Acute metabolic encephalopathy- likely multifactorial with AKI, covid-19, hypoxia, and sepsis.  Overall stable  # Anemia of acute illness and CKD stage IV- follow H/H and iron stores. -  Aranesp added.    # HTN-blood pressure acceptable at this time  Subjective: Chart review only.  For HD today.  Has refused to wear CPAP  Objective Vital signs in last 24 hours: Vitals:   08/10/20 0500 08/10/20 0600 08/10/20 0700 08/10/20 0732  BP:    (!) 141/67  Pulse: (!) 59 62 70 61  Resp:    19  Temp:    97.9 F (36.6 C)  TempSrc:    Oral  SpO2: (!) 87% 93% 92% 93%  Weight: 65.4 kg     Height:       Weight change: -0.4 kg  Intake/Output Summary (Last 24 hours) at 08/10/2020 1128 Last data filed at 08/09/2020 1351 Gross per 24 hour  Intake 237 ml  Output --  Net 237 ml       Labs: Basic Metabolic Panel: Recent Labs  Lab 08/06/20 0130 08/07/20 0230 08/10/20 0408  NA 140 136 135  K 3.9 4.5 3.5  CL 103 100 97*  CO2 26 24 24   GLUCOSE 48* 123* 36*  BUN 46* 65* 62*  CREATININE 4.65* 5.89* 6.08*   CALCIUM 8.6* 8.3* 7.9*  PHOS 4.9* 6.7* 5.9*   Liver Function Tests: Recent Labs  Lab 08/05/20 1012 08/06/20 0130 08/07/20 0230 08/10/20 0408  AST 17 13* 14*  --   ALT 18 14 17   --   ALKPHOS 72 63 64  --   BILITOT 0.7 0.6 0.3  --   PROT 5.5* 5.1* 5.0*  --   ALBUMIN 2.4* 2.2* 2.3* 2.2*   No results for input(s): LIPASE, AMYLASE in the last 168 hours. No results for input(s): AMMONIA in the last 168 hours. CBC: Recent Labs  Lab 08/06/20 0130 08/07/20 0230 08/07/20 0647 08/08/20 0316 08/10/20 0408  WBC 15.8* 14.6* 15.6* 14.5* 10.2  NEUTROABS 14.7* 13.8*  --  12.8*  --   HGB 7.0* 6.9* 7.5* 10.3* 10.1*  HCT 22.2* 21.7* 24.2* 30.3* 30.2*  MCV 71.2* 71.4* 71.8* 73.2* 74.9*  PLT 226 188 228 202 118*   Cardiac Enzymes: No results for input(s): CKTOTAL, CKMB, CKMBINDEX, TROPONINI in the last 168 hours. CBG: Recent Labs  Lab 08/09/20 1736 08/09/20 2106 08/10/20 0610 08/10/20 0623 08/10/20 0747  GLUCAP 172* 120* 38* 247* 123*    Iron Studies:  No results for input(s): IRON, TIBC, TRANSFERRIN, FERRITIN in the last 72 hours. Studies/Results: No results found.  Medications: Infusions:   Scheduled Medications: . sodium chloride  Intravenous Once  . sodium chloride   Intravenous Once  . amiodarone  200 mg Oral Daily  . amLODipine  10 mg Oral Daily  . apixaban  2.5 mg Oral BID  . vitamin C  500 mg Oral Daily  . atorvastatin  20 mg Oral q1800  . chlorhexidine  15 mL Mouth Rinse BID  . Chlorhexidine Gluconate Cloth  6 each Topical Q0600  . cloNIDine  0.1 mg Oral TID  . darbepoetin (ARANESP) injection - DIALYSIS  150 mcg Intravenous Q Fri-HD  . haloperidol lactate  2 mg Intravenous Once  . insulin aspart  0-5 Units Subcutaneous QHS  . insulin aspart  0-9 Units Subcutaneous TID WC  . mouth rinse  15 mL Mouth Rinse q12n4p  . metoprolol tartrate  12.5 mg Oral BID  . pantoprazole  40 mg Oral Daily  . QUEtiapine  25 mg Oral QHS  . sodium chloride flush  10-40 mL  Intracatheter Q12H  . sodium chloride flush  3 mL Intravenous Q12H  . zinc sulfate  220 mg Oral Daily    have reviewed scheduled and prn medications.  Physical Exam: Not examined today  [name] [name] 08/10/2020,11:28 AM  LOS: 16 days

## 2020-08-10 NOTE — TOC Progression Note (Signed)
Transition of Care [facility]) - Progression Note    Patient Details  Name: [name] [name] MRN: [id number] Date of Birth: [birth year]  [facility] CM/SW [place], LCSW Phone Number: 08/10/2020, 8:53 AM  Clinical Narrative:    [name] [name] still awaiting insurance approval for SNF.    Expected Discharge Plan: [place] Barriers to Discharge: SNF Pending bed offer,Insurance Authorization  Expected Discharge Plan and Services Expected Discharge Plan: [place] In-house Referral: Clinical Social Work Discharge Planning Services: CM Consult   Living arrangements for the past 2 months: [place]: [place] Date [place]: 08/05/20 Time [place]: [phone] Representative spoke with at [place]: Per [name] [name] [facility] will be week of Jan 3rd   Social Determinants of Health (SDOH) Interventions    Readmission Risk Interventions No flowsheet data found.

## 2020-08-10 NOTE — Progress Notes (Signed)
Physical Therapy Treatment Patient Details Name: [name] [name] MRN: [id number] DOB: 12-May-1936 Today's Date: 08/10/2020    History of Present Illness Pt is an 85 y.o. female admitted 07/25/20 with 5 days of cough, lethargy, confusion; found to be hypoxic and tested (+) COVID-19, also with AKI, afib with RVR, sepsis. Pt requiring HD initiation and  with permanent HD catheter placed 08/04/20. PMH includes RA, CKD 4, DM2, HTN, OSA on CPAP, TIA, CHF.   PT Comments    Pt slowly progressing with mobility. Pt with improved ability to follow simple commands this session, although still limited by delayed processing, disorientation, poor safety awareness and impaired attention. Pt requires frequent min-modA for mobility; limited to short bouts of standing activity due to fatigue with standing and ADL tasks. Pt unaware of bowel incontinence, dependent for pericare. Continue to recommend SNF-level therapies to maximize functional mobility and independence prior to return home.    Follow Up Recommendations  SNF;Supervision/Assistance - 24 hour     Equipment Recommendations  3in1 (PT)    Recommendations for Other Services       Precautions / Restrictions Precautions Precautions: Fall Restrictions Weight Bearing Restrictions: No    Mobility  Bed Mobility Overal bed mobility: Needs Assistance Bed Mobility: Supine to Sit     Supine to sit: Mod assist     General bed mobility comments: Max cues for task, eventually requiring modA to initiate movement and scoot hips to EOB; pt minimally moving legs and reports, "That's all I'm able to do." Appears mostly limited by cognition with bed mobility  Transfers Overall transfer level: Needs assistance Equipment used: Rolling walker (2 wheeled) Transfers: Sit to/from Stand Sit to Stand: Min assist;Min guard         General transfer comment: Initial minA to assist trunk elevation and maintain stability standing from EOB, cues for  sequencing; repeated trials from recliner, pt able to perform with close min guard  Ambulation/Gait Ambulation/Gait assistance: Min assist Gait Distance (Feet): 4 Feet Assistive device: Rolling walker (2 wheeled) Gait Pattern/deviations: Step-to pattern;Trunk flexed;Leaning posteriorly Gait velocity: Decreased   General Gait Details: Slow, unsteady gait with RW and minA to maintain balance as pt with posterior lean and attempting to sit prematurely, poor safety awareness; pt unaware of bowel incontinence, further ambulation distance limited by fatigue after multiple standing bouts for pericare and linen change   Stairs             Wheelchair Mobility    Modified Rankin (Stroke Patients Only)       Balance Overall balance assessment: Needs assistance Sitting-balance support: Feet unsupported Sitting balance-Leahy Scale: Fair     Standing balance support: During functional activity;Single extremity supported;Bilateral upper extremity supported Standing balance-Leahy Scale: Poor Standing balance comment: Reliant on UE support; dependent for posterior pericare                            Cognition Arousal/Alertness: Awake/alert Behavior During Therapy: Flat affect Overall Cognitive Status: No family/caregiver present to determine baseline cognitive functioning Area of Impairment: Orientation;Attention;Memory;Following commands;Safety/judgement;Awareness;Problem solving                 Orientation Level: Disoriented to;Place;Time;Situation Current Attention Level: Focused;Sustained Memory: Decreased short-term memory;Decreased recall of precautions Following Commands: Follows one step commands inconsistently Safety/Judgement: Decreased awareness of safety;Decreased awareness of deficits Awareness: Intellectual Problem Solving: Slow processing;Decreased initiation;Difficulty sequencing;Requires verbal cues;Requires tactile cues General Comments: Improved  ability to perform simple commands this  session [place], stand up" "sit down" etc.); still requires multimodal cues for problem solving, frequent cues to attend to task. Unaware of bowel incontinence. More conversant but very repetitive      Exercises      General Comments General comments (skin integrity, edema, etc.): SpO2 down to 88% on RA; pt declined dizziness with activity. NT present at end of session to assist pt with eating lunch      Pertinent Vitals/Pain Pain Assessment: No/denies pain    Home Living                      Prior Function            PT Goals (current goals can now be found in the care plan section) Progress towards PT goals: Progressing toward goals    Frequency    Min 2X/week      PT Plan Current plan remains appropriate    Co-evaluation              AM-PAC PT "6 Clicks" Mobility   Outcome Measure  Help needed turning from your back to your side while in a flat bed without using bedrails?: A Lot Help needed moving from lying on your back to sitting on the side of a flat bed without using bedrails?: A Lot Help needed moving to and from a bed to a chair (including a wheelchair)?: A Little Help needed standing up from a chair using your arms (e.g., wheelchair or bedside chair)?: A Little Help needed to walk in hospital room?: A Lot Help needed climbing 3-5 steps with a railing? : A Lot 6 Click Score: 14    End of Session   Activity Tolerance: Patient tolerated treatment well;Patient limited by fatigue Patient left: in chair;with call bell/phone within reach;with chair alarm set;with nursing/sitter in room Nurse Communication: Mobility status PT Visit Diagnosis: Other abnormalities of gait and mobility (R26.89);Muscle weakness (generalized) (M62.81)     Time: 1310-1330 PT Time Calculation (min) (ACUTE ONLY): 20 min  Charges:  $Therapeutic Activity: 8-22 mins                    Mabeline Caras, PT, DPT Acute Rehabilitation  Services  Pager [phone] Office [phone]  Derry Lory 08/10/2020, 2:33 PM

## 2020-08-11 DIAGNOSIS — U071 COVID-19: Secondary | ICD-10-CM | POA: Diagnosis not present

## 2020-08-11 DIAGNOSIS — I4891 Unspecified atrial fibrillation: Secondary | ICD-10-CM | POA: Diagnosis not present

## 2020-08-11 DIAGNOSIS — J9601 Acute respiratory failure with hypoxia: Secondary | ICD-10-CM | POA: Diagnosis not present

## 2020-08-11 LAB — GLUCOSE, CAPILLARY
Glucose-Capillary: 186 mg/dL — ABNORMAL HIGH (ref 70–99)
Glucose-Capillary: 192 mg/dL — ABNORMAL HIGH (ref 70–99)
Glucose-Capillary: 126 mg/dL — ABNORMAL HIGH (ref 70–99)
Glucose-Capillary: 158 mg/dL — ABNORMAL HIGH (ref 70–99)

## 2020-08-11 MED ORDER — QUETIAPINE FUMARATE 25 MG PO TABS: 12.5000 mg | ORAL_TABLET | Freq: Every day | ORAL | Status: DC

## 2020-08-11 NOTE — Care Management Important Message (Signed)
Important Message  Patient Details  Name: [name] [name] MRN: [id number] Date of Birth: 1936/05/10   Medicare Important Message Given:  Yes - Important Message mailed due to current National Emergency  Verbal consent obtained due to current National Emergency  Relationship to patient: Child Contact Name: Mila Palmer Call Date: 08/11/20  Time: 1202 Phone: [phone] Outcome: Spoke with contact Important Message mailed to: Patient address on file    Delorse Lek 08/11/2020, 12:02 PM

## 2020-08-11 NOTE — Progress Notes (Signed)
[name] [name] did call back, states patient had clothes and dentures upon admission, I explained there are no belongings in the patients room and on admission to the unit there were no belongings documented, [name] [name] stated she was going to call the ER and I notified the unit charge nurse today of this matter.

## 2020-08-11 NOTE — Progress Notes (Signed)
[facility] Progress Note   Assessment/ Plan:   # AKIon CKD stage IV--> new ESRD: In setting of sepsis due to covid-19 and CAP. Baseline Scr 2.7-3. Renal US without obstruction but chronicity. No improvement with IVF's and rising BUN/Cr.  Started dialysis on 12/22 after placement of temporary HD catheter.  Since then she has been tolerating dialysis well.   The HD catheter was converted to tunnel on 12/28. Dialysis maintaining MWF schedule. She is accepted at [facility] MWF -Continue dialysis MWF Plan for permanent access placement with AVF when Covid negative, as outpatient.  #Acute hypoxemic respiratory failure due to covid-19 and community acquired PNA-continue management per primary team, on RA now  # Sepsis due to covid-19 and CAP-  Mgmt per primary team  # Atrial fib with RVR- started on amiodarone per Cardiology.  # Acute metabolic encephalopathy- likely multifactorial with AKI, covid-19, hypoxia, and sepsis.  Overall stable/ improved  # Anemia of CKD follow H/H and iron stores. - Aranesp added.    # HTN-blood pressure acceptable at this time  Subjective:    Seen and examined.  Still awaiting SNF.  For HD tomorrow.  No complaints   Objective:   BP (!) 117/54 (BP Location: Right Arm)   Pulse 89   Temp 98.7 F (37.1 C) (Axillary)   Resp 16   Ht 5\' 1"  (1.549 m)   Wt 64.5 kg   SpO2 99%   BMI 26.87 kg/m   Physical Exam: Gen: NAD, sitting up in chair CVS: RRR Resp: clear Abd: soft Ext:no LE edema ACCESS: [facility]  Labs: BMET Recent Labs  Lab 08/05/20 1012 08/06/20 0130 08/07/20 0230 08/10/20 0408  NA 136 140 136 135  K 3.9 3.9 4.5 3.5  CL 100 103 100 97*  CO2 23 26 24 24   GLUCOSE 167* 48* 123* 36*  BUN 37* 46* 65* 62*  CREATININE 3.91* 4.65* 5.89* 6.08*  CALCIUM 8.5* 8.6* 8.3* 7.9*  PHOS 4.9* 4.9* 6.7* 5.9*   CBC Recent Labs  Lab 08/05/20 1012 08/06/20 0130 08/07/20 0230 08/07/20 0647 08/08/20 0316 08/10/20 0408  WBC  16.0* 15.8* 14.6* 15.6* 14.5* 10.2  NEUTROABS 14.5* 14.7* 13.8*  --  12.8*  --   HGB 7.5* 7.0* 6.9* 7.5* 10.3* 10.1*  HCT 23.9* 22.2* 21.7* 24.2* 30.3* 30.2*  MCV 70.5* 71.2* 71.4* 71.8* 73.2* 74.9*  PLT 228 226 188 228 202 118*      Medications:    . sodium chloride   Intravenous Once  . sodium chloride   Intravenous Once  . amiodarone  200 mg Oral Daily  . amLODipine  5 mg Oral Daily  . apixaban  2.5 mg Oral BID  . vitamin C  500 mg Oral Daily  . atorvastatin  20 mg Oral q1800  . chlorhexidine  15 mL Mouth Rinse BID  . Chlorhexidine Gluconate Cloth  6 each Topical Q0600  . cloNIDine  0.1 mg Oral TID  . darbepoetin (ARANESP) injection - DIALYSIS  150 mcg Intravenous Q Fri-HD  . haloperidol lactate  2 mg Intravenous Once  . insulin aspart  0-5 Units Subcutaneous QHS  . insulin aspart  0-9 Units Subcutaneous TID WC  . mouth rinse  15 mL Mouth Rinse q12n4p  . metoprolol tartrate  12.5 mg Oral BID  . pantoprazole  40 mg Oral Daily  . QUEtiapine  25 mg Oral QHS  . sodium chloride flush  10-40 mL Intracatheter Q12H  . sodium chloride flush  3 mL  Intravenous Q12H  . zinc sulfate  220 mg Oral Daily     Madelon Lips, MD 08/11/2020, 11:01 AM

## 2020-08-11 NOTE — Progress Notes (Signed)
PROGRESS NOTE    [name] [name]  BWG:[id number] DOB: 1936-06-02 DOA: 07/25/2020 PCP: [name], MD     Brief Narrative:  [name] [name] is an 85 year old female with history of RA, CKD 4, DM 2, HTN, HLD, OSA on CPAP, TIA, chronic diastolic CHF.   To the hospital with 5 days of cough, lethargy, confusion. Was found to be hypoxic, tested positive for Covid and was admitted to the hospital. She was also found to have acute kidney injury. She was also found to have A. fib with RVR. Cardiology and nephrology were consulted on admission. Renal function has not improved and the patient is transferred to [facility] in anticipation of dialysis initiation.   Subjective:  Patient seen and examined, she herself denies any complaints , no further hypoglycemia this morning after holding her Lantus.     Assessment & Plan:  Covid vaccination; unvaccinated   Principal Problem:   Acute hypoxemic respiratory failure due to COVID-19 [facility]) Active Problems:   Essential hypertension   HLD (hyperlipidemia)   DM (diabetes mellitus), type 2 with renal complications (HCC)   Chronic diastolic HF (heart failure) (HCC)   CAP (community acquired pneumonia)   CKD (chronic kidney disease), stage IV (HCC)   AKI (acute kidney injury) ([place])   Sepsis ([place])   Atrial fibrillation with RVR ([place])   Delirium  Acute respiratory failure with hypoxia/Covid pneumonia/suspected bacterial superinfection COVID-19 Labs -She is unvaccinated -Chest x-ray significant for bilateral multifocal pneumonia -Is with significant hypoxia, severe of pneumonia, with significant oxygen requirement, significantly improved, this morning she is room air -Treated with IV Solu-Medrol, been tapered off, last dose 08/08/2020. -Treated with antibiotics for suspicion for bacterial infection, cultures no growth to date. -She was encouraged with incentive spirometry, flutter valve, out of bed to chair  No results for input(s):  DDIMER, FERRITIN, LDH, CRP in the last 72 hours.  Lab Results  Component Value Date   SARSCOV2NAA POSITIVE (A) 07/25/2020    Sepsis pneumonia (Covid 19/suspected CAP) -On admission criteria for sepsis tachycardia, tachypnea, leukocytosis -Cultures obtained, placed on antibiotics.  All physiologic signs of sepsis have resolved.  Antibiotic course completed.  Elevated D-dimer -Due to COVID-19 infection, as well she is on full anticoagulation with Eliquis.  Acute on CKD stage IV (baseline Cr 2.4-2.7) -On presentation Cr 6.8 not improving -Agement per renal, started on HD this admission  Anion gap metabolic acidosis Resolved.  On dialysis now.  Chronic diastolic CHF -Volume management with dialysis Filed Weights   08/10/20 1836 08/10/20 2000 08/11/20 0400  Weight: 64.5 kg 64.5 kg 64.5 kg   A. fib with RVR -CHADS2Vasc score is 7 (HTN, age x 2, DM, TIA x2, gender),  -Cardiology consulted -She is on Eliquis for anticoagulation -On metoprolol and amiodarone for heart rate control  Essential HTN Controlled.  Continue current management.  Pulmonary HTN -See CHF  Acute metabolic encephalopathy -Has significantly improved, but she does remember some hospital delirium, this is improving on Seroquel  Hx TIA Transitioned from heparin to Eliquis.  No aspirin. -Lipitor 20 mg daily -12/22 LDL = 57: Goal LDL <70   DM type II uncontrolled with Hyperglycemia -She is having recurrent hypoglycemia, poor appetite as she is missing her dentures, she is currently on pured diet . -Off Tradjenta and Redder-acting insulin, CBGs acceptable on insulin sliding scale . - I have asked her daughter to bring her dentures.  Anemia -Anemia of acute illness and chronic kidney disease, hemoglobin 6.9 12/30 1 AM, she was  transfused PRBC with good response, as well she is on Aranesp per renal.  DVT prophylaxis: Eliquis Code Status: Full Family Communication: D/W daughter [name] 08/07/2020,  08/11/2020 Status is: Inpatient    Dispo: The patient is from:               Anticipated d/c is to: SNF              Anticipated d/c date is: When bed is available and insurance authorization obtained.              Patient currently stable       Consultants:  Nephrology Cardiology  Procedures/Significant Events:  12/22 nontunneled HD catheter terminating within the superior aspect RIGHT atrium 12/23 echocardiogram limited;LVEF= 55 to 60%.  -Grade I diastolic dysfunction  Right Ventricle: Mldly elevated pulmonary artery systolic pressure. estimated  right ventricular systolic pressure is 40.1 mmHg.    I have personally reviewed and interpreted all radiology studies and my findings are as above.  VENTILATOR SETTINGS: HFNC 12/28 Flow; 4 L/min SPO2; percent   Cultures 12/18 blood RIGHT AC negative 12/18 MRSA by PCR negative 12/18 blood right hand negative final x2    Antimicrobials: Anti-infectives (From admission, onward)   Start     Ordered Stop   08/04/20 1733  vancomycin (VANCOCIN) 1-5 GM/200ML-% IVPB       Note to Pharmacy: Fredric Dine   : cabinet override   08/04/20 1733 08/05/20 0544   08/04/20 1445  vancomycin (VANCOCIN) IVPB 1000 mg/200 mL premix        08/04/20 1356 08/04/20 1911   07/26/20 1000  cefTRIAXone (ROCEPHIN) 1 g in sodium chloride 0.9 % 100 mL IVPB        07/25/20 1138 07/29/20 0911   07/26/20 1000  doxycycline (VIBRAMYCIN) 100 mg in sodium chloride 0.9 % 250 mL IVPB        07/25/20 1138 07/27/20 2335   07/25/20 0500  levofloxacin (LEVAQUIN) IVPB 750 mg        07/25/20 0453 07/25/20 0824         Objective: Vitals:   08/10/20 2355 08/11/20 0400 08/11/20 0746 08/11/20 1251  BP: 98/80 (!) 106/55 (!) 117/54 (!) 121/55  Pulse: 91 83 89 77  Resp: 16 17 16 19   Temp: 97.7 F (36.5 C) 97.7 F (36.5 C) 98.7 F (37.1 C) 98.1 F (36.7 C)  TempSrc: Oral Oral Axillary Oral  SpO2: 100% 98% 99% 98%  Weight:  64.5 kg    Height:         Intake/Output Summary (Last 24 hours) at 08/11/2020 1512 Last data filed at 08/11/2020 0900 Gross per 24 hour  Intake 150 ml  Output 2000 ml  Net -1850 ml   Filed Weights   08/10/20 1836 08/10/20 2000 08/11/20 0400  Weight: 64.5 kg 64.5 kg 64.5 kg   Physical exam:  Awake Alert, frail, deconditioned Symmetrical Chest wall movement, Good air movement bilaterally, CTAB RRR,No Gallops,Rubs or new Murmurs, No Parasternal Heave +ve B.Sounds, Abd Soft, No tenderness, No rebound - guarding or rigidity. No Cyanosis, Clubbing or edema, No new Rash or bruise       CBC: Recent Labs  Lab 08/05/20 1012 08/06/20 0130 08/07/20 0230 08/07/20 0647 08/08/20 0316 08/10/20 0408  WBC 16.0* 15.8* 14.6* 15.6* 14.5* 10.2  NEUTROABS 14.5* 14.7* 13.8*  --  12.8*  --   HGB 7.5* 7.0* 6.9* 7.5* 10.3* 10.1*  HCT 23.9* 22.2* 21.7* 24.2* 30.3* 30.2*  MCV 70.5* 71.2* 71.4*  71.8* 73.2* 74.9*  PLT 228 226 188 228 202 725*   Basic Metabolic Panel: Recent Labs  Lab 08/05/20 1012 08/06/20 0130 08/07/20 0230 08/10/20 0408  NA 136 140 136 135  K 3.9 3.9 4.5 3.5  CL 100 103 100 97*  CO2 23 26 24 24   GLUCOSE 167* 48* 123* 36*  BUN 37* 46* 65* 62*  CREATININE 3.91* 4.65* 5.89* 6.08*  CALCIUM 8.5* 8.6* 8.3* 7.9*  MG 2.0 2.1 2.2  --   PHOS 4.9* 4.9* 6.7* 5.9*   GFR: Estimated Creatinine Clearance: 6.1 mL/min (A) (by C-G formula based on SCr of 6.08 mg/dL (H)). Liver Function Tests: Recent Labs  Lab 08/05/20 1012 08/06/20 0130 08/07/20 0230 08/10/20 0408  AST 17 13* 14*  --   ALT 18 14 17   --   ALKPHOS 72 63 64  --   BILITOT 0.7 0.6 0.3  --   PROT 5.5* 5.1* 5.0*  --   ALBUMIN 2.4* 2.2* 2.3* 2.2*   No results for input(s): LIPASE, AMYLASE in the last 168 hours. No results for input(s): AMMONIA in the last 168 hours. Coagulation Profile: No results for input(s): INR, PROTIME in the last 168 hours. Cardiac Enzymes: No results for input(s): CKTOTAL, CKMB, CKMBINDEX, TROPONINI in the last  168 hours. BNP (last 3 results) No results for input(s): PROBNP in the last 8760 hours. HbA1C: No results for input(s): HGBA1C in the last 72 hours. CBG: Recent Labs  Lab 08/10/20 1308 08/10/20 2006 08/10/20 2357 08/11/20 0745 08/11/20 1243  GLUCAP 110* 231* 189* 186* 192*   Lipid Profile: No results for input(s): CHOL, HDL, LDLCALC, TRIG, CHOLHDL, LDLDIRECT in the last 72 hours. Thyroid Function Tests: No results for input(s): TSH, T4TOTAL, FREET4, T3FREE, THYROIDAB in the last 72 hours. Anemia Panel: No results for input(s): VITAMINB12, FOLATE, FERRITIN, TIBC, IRON, RETICCTPCT in the last 72 hours. Sepsis Labs: No results for input(s): PROCALCITON, LATICACIDVEN in the last 168 hours.  No results found for this or any previous visit (from the past 240 hour(s)).       Radiology Studies: No results found.      Scheduled Meds: . sodium chloride   Intravenous Once  . sodium chloride   Intravenous Once  . amiodarone  200 mg Oral Daily  . amLODipine  5 mg Oral Daily  . apixaban  2.5 mg Oral BID  . vitamin C  500 mg Oral Daily  . atorvastatin  20 mg Oral q1800  . chlorhexidine  15 mL Mouth Rinse BID  . Chlorhexidine Gluconate Cloth  6 each Topical Q0600  . cloNIDine  0.1 mg Oral TID  . darbepoetin (ARANESP) injection - DIALYSIS  150 mcg Intravenous Q Fri-HD  . haloperidol lactate  2 mg Intravenous Once  . insulin aspart  0-5 Units Subcutaneous QHS  . insulin aspart  0-9 Units Subcutaneous TID WC  . mouth rinse  15 mL Mouth Rinse q12n4p  . metoprolol tartrate  12.5 mg Oral BID  . pantoprazole  40 mg Oral Daily  . QUEtiapine  25 mg Oral QHS  . sodium chloride flush  10-40 mL Intracatheter Q12H  . sodium chloride flush  3 mL Intravenous Q12H  . zinc sulfate  220 mg Oral Daily   Continuous Infusions:    LOS: 17 days     Phillips Climes, MD [facility]  If 7PM-7AM, please contact night-coverage [email or web address] Password [email or web address] 08/11/2020, 3:12 PM

## 2020-08-11 NOTE — Progress Notes (Signed)
This nurse spoke with patient's nurse regarding POC and pt frequetly pulling out multiple PIVs. At this time recommended to discuss with MD regarding changing meds to other route. VU. Fran Lowes, RN VAST

## 2020-08-11 NOTE — Progress Notes (Signed)
Family called for updates, I did try to return their call, I had to leave a message for them to return my call [phone]

## 2020-08-11 NOTE — Progress Notes (Signed)
Patient does not wear CPAP at home.

## 2020-08-11 NOTE — TOC Progression Note (Signed)
Transition of Care [facility]) - Progression Note    Patient Details  Name: Markan Cazarez MRN: [id number] Date of Birth: 06-05-1936  Transition of Care [facility]) CM/SW [place], [place] Phone Number: 08/11/2020, 4:29 PM  Clinical Narrative:    Wandra Feinstein is still waiting on [place] authorization.    Expected Discharge Plan: [place] Barriers to Discharge: SNF Pending bed offer,Insurance Authorization  Expected Discharge Plan and Services Expected Discharge Plan: [place] In-house Referral: Clinical Social Work Discharge Planning Services: CM Consult   Living arrangements for the past 2 months: [place]: Well [place] Date [place]: 08/05/20 Time [place]: [phone] Representative spoke with at [place]: Per Karsten Fells [facility] will be week of Jan 3rd   Social Determinants of Health (SDOH) Interventions    Readmission Risk Interventions No flowsheet data found.

## 2020-08-11 NOTE — Progress Notes (Signed)
Patients daughter [name] called stated that her mom came to hospital with dentures in, they even went to her house to make sure they were not there. I did go and look in patients room and mouth and there are no dentures, I also looked in the admission history when patient was admitted to the unit and there were no belongings with patient, I called left a message for [place] to return my call so she can be notified.

## 2020-08-12 DIAGNOSIS — N186 End stage renal disease: Secondary | ICD-10-CM

## 2020-08-12 DIAGNOSIS — D696 Thrombocytopenia, unspecified: Secondary | ICD-10-CM

## 2020-08-12 DIAGNOSIS — J9601 Acute respiratory failure with hypoxia: Secondary | ICD-10-CM | POA: Diagnosis not present

## 2020-08-12 DIAGNOSIS — U071 COVID-19: Secondary | ICD-10-CM | POA: Diagnosis not present

## 2020-08-12 DIAGNOSIS — I4891 Unspecified atrial fibrillation: Secondary | ICD-10-CM | POA: Diagnosis not present

## 2020-08-12 DIAGNOSIS — E1121 Type 2 diabetes mellitus with diabetic nephropathy: Secondary | ICD-10-CM | POA: Diagnosis not present

## 2020-08-12 LAB — RENAL FUNCTION PANEL
Albumin: 2 g/dL — ABNORMAL LOW (ref 3.5–5.0)
Anion gap: 11 (ref 5–15)
BUN: 45 mg/dL — ABNORMAL HIGH (ref 8–23)
CO2: 26 mmol/L (ref 22–32)
Calcium: 7.3 mg/dL — ABNORMAL LOW (ref 8.9–10.3)
Chloride: 97 mmol/L — ABNORMAL LOW (ref 98–111)
Creatinine, Ser: 5.61 mg/dL — ABNORMAL HIGH (ref 0.44–1.00)
GFR, Estimated: 7 mL/min — ABNORMAL LOW (ref >60)
Glucose, Bld: 206 mg/dL — ABNORMAL HIGH (ref 70–99)
Phosphorus: 4 mg/dL (ref 2.5–4.6)
Potassium: 4.1 mmol/L (ref 3.5–5.1)
Sodium: 134 mmol/L — ABNORMAL LOW (ref 135–145)

## 2020-08-12 LAB — GLUCOSE, CAPILLARY
Glucose-Capillary: 182 mg/dL — ABNORMAL HIGH (ref 70–99)
Glucose-Capillary: 169 mg/dL — ABNORMAL HIGH (ref 70–99)
Glucose-Capillary: 135 mg/dL — ABNORMAL HIGH (ref 70–99)
Glucose-Capillary: 213 mg/dL — ABNORMAL HIGH (ref 70–99)
Glucose-Capillary: 179 mg/dL — ABNORMAL HIGH (ref 70–99)

## 2020-08-12 LAB — CBC
HCT: 28.1 % — ABNORMAL LOW (ref 36.0–46.0)
Hemoglobin: 9 g/dL — ABNORMAL LOW (ref 12.0–15.0)
MCH: 24.9 pg — ABNORMAL LOW (ref 26.0–34.0)
MCHC: 32 g/dL (ref 30.0–36.0)
MCV: 77.6 fL — ABNORMAL LOW (ref 80.0–100.0)
Platelets: 82 10*3/uL — ABNORMAL LOW (ref 150–400)
RBC: 3.62 MIL/uL — ABNORMAL LOW (ref 3.87–5.11)
RDW: 21.5 % — ABNORMAL HIGH (ref 11.5–15.5)
WBC: 9 10*3/uL (ref 4.0–10.5)
nRBC: 0 % (ref 0.0–0.2)

## 2020-08-12 MED ORDER — LIDOCAINE HCL (PF) 1 % IJ SOLN: 5.0000 mL | INTRAMUSCULAR | Status: DC | PRN

## 2020-08-12 MED ORDER — HEPARIN SODIUM (PORCINE) 1000 UNIT/ML DIALYSIS
1000.0000 [IU] | INTRAMUSCULAR | Status: DC | PRN
Start: 2020-08-12 — End: 2020-08-12

## 2020-08-12 MED ORDER — LIDOCAINE-PRILOCAINE 2.5-2.5 % EX CREA: 1.0000 "application " | TOPICAL_CREAM | CUTANEOUS | Status: DC | PRN

## 2020-08-12 MED ORDER — PENTAFLUOROPROP-TETRAFLUOROETH EX AERO: 1.0000 "application " | INHALATION_SPRAY | CUTANEOUS | Status: DC | PRN

## 2020-08-12 MED ORDER — SODIUM CHLORIDE 0.9 % IV SOLN: 100.0000 mL | INTRAVENOUS | Status: DC | PRN

## 2020-08-12 MED ORDER — ALTEPLASE 2 MG IJ SOLR: 2.0000 mg | Freq: Once | INTRAMUSCULAR | Status: DC | PRN

## 2020-08-12 MED ORDER — HEPARIN SODIUM (PORCINE) 1000 UNIT/ML IJ SOLN
INTRAMUSCULAR | Status: AC
  Filled 2020-08-12: qty 4

## 2020-08-12 NOTE — Hospital Course (Addendum)
85 year old woman hospitalized for acute hypoxic respiratory failure secondary to Covid pneumonia, suspected bacterial superinfection, complicated by progression of CKD to end-stage renal disease and new start of hemodialysis, atrial fibrillation with rapid ventricular response.  Hospitalization prolonged waiting for SNF placement.  This was denied and denied on appeal 1/6.  Home w/ HH max.  A & P  Acute hypoxic respiratory failure secondary to sepsis, Covid pneumonia, suspected bacterial superinfection. --Resolved, completed treatment, off antibiotics, off steroids. --remains stable  CKD stage IV with progression end-stage renal disease, started on hemodialysis this admission. --next HD as outpt this evening  Acute atrial fibrillation with rapid ventricular response, seen by cardiology, continue apixaban, metoprolol and amiodarone. --stable, will stop amiodarone as per cardiology rec as afib was thought to be secondary to resp drive. Consider stopping anticoagulation as an outpatient.  Thrombocytopenia --new last several days, now stable, follow-up as an outpatient.  Diabetes mellitus type 2 with hyperglycemia --Remains stable. Resume Levemir lower dose on discharge and titrate up for hyperglycemia per PCP  Anemia of acute illness, chronic kidney disease --Remains stable, continue Aranesp per nephrology.  Acute metabolic encephalopathy --Mild confusion but appears stable. Expect will slowly improve.

## 2020-08-12 NOTE — TOC Progression Note (Signed)
Transition of Care [facility]) - Progression Note    Patient Details  Name: Almeda Ezra MRN: [id number] Date of Birth: Sep 11, 1935  Transition of Care [facility]) CM/SW [place], LCSW Phone Number: 08/12/2020, 9:41 AM  Clinical Narrative:    [facility] Director contacting [employer] to see what is taking so [place] for authorization.    Expected Discharge Plan: [place] Barriers to Discharge: SNF Pending bed offer,Insurance Authorization  Expected Discharge Plan and Services Expected Discharge Plan: [place] In-house Referral: Clinical Social Work Discharge Planning Services: CM Consult   Living arrangements for the past 2 months: [place]: Well [place] Date [place]: 08/05/20 Time [place]: [phone] Representative spoke with at [place]: Per Karsten Fells [facility] will be week of Jan 3rd   Social Determinants of Health (SDOH) Interventions    Readmission Risk Interventions No flowsheet data found.

## 2020-08-12 NOTE — Progress Notes (Signed)
No biopatch in place, RN advised pt IVT to change dressing while on unit and apply biopatch.  Pt wants to wait until after she eats dinner, which just arrived

## 2020-08-12 NOTE — Progress Notes (Signed)
RT note. Pt. Does not wear CPAP at home, refusing. CPAP in rm if pt. Changes mind. RT will continue to monitor.

## 2020-08-12 NOTE — Progress Notes (Signed)
PROGRESS NOTE  [name] [name] Nazir OAC:[id number] DOB: 1935-11-17 DOA: 07/25/2020 PCP: [name], MD  Brief History   85 year old woman hospitalized for acute hypoxic respiratory failure secondary to Covid pneumonia, suspected bacterial superinfection, complicated by progression of CKD to end-stage renal disease and new start of hemodialysis, atrial fibrillation with rapid ventricular response.  Now awaiting SNF placement.  A & P  Acute hypoxic respiratory failure secondary to sepsis, Covid pneumonia, suspected bacterial superinfection. --Resolved.  Completed treatment, off antibiotics, off steroids.  CKD stage IV with progression end-stage renal disease, started on hemodialysis this admission. --Continue HD per nephrology.  Acute atrial fibrillation with rapid ventricular response, seen by cardiology, continue apixaban, metoprolol and amiodarone.  Thrombocytopenia --New over the last 48 hours. --CBC in a.m.  May need further evaluation.  Diabetes mellitus type 2 with hyperglycemia --Stable. Continue SSI.  Anemia of acute illness, chronic kidney disease --Stable.  Continue Aranesp per nephrology.  Acute metabolic encephalopathy --Mild confusion but appears stable.  Disposition Plan:  Discussion: Only issue outstanding is new thrombocytopenia, CBC in the morning.  Otherwise stable.  If platelets are stable, can transfer to SNF when bed available.  Status is: Inpatient  Remains inpatient appropriate because:Inpatient level of care appropriate due to severity of illness   Dispo: The patient is from: Home              Anticipated d/c is to: SNF              Anticipated d/c date is: 1 day              Patient currently is not medically stable to d/c.  DVT prophylaxis: apixaban (ELIQUIS) tablet 2.5 mg Start: 08/05/20 1430 apixaban (ELIQUIS) tablet 2.5 mg    Code Status: Full Code Family Communication: none  Murray Hodgkins, MD  [facility] Direct contact:  see [email or web address] (further directions at bottom of note if needed) 7PM-7AM contact night coverage as at bottom of note 08/12/2020, 7:26 PM  LOS: 18 days   Significant Hospital Events   .    Consults:  .    Procedures:  .   Significant Diagnostic Tests:  Marland Kitchen    Micro Data:  .    Antimicrobials:  .   Interval History/Subjective  CC: f/u COVID  Feels fine, no complaints   Objective   Vitals:  Vitals:   08/12/20 1308 08/12/20 1500  BP: (!) 120/58 118/62  Pulse: 84 97  Resp: (!) 22 (!) 25  Temp: (!) 97.4 F (36.3 C) (!) 97.5 F (36.4 C)  SpO2: 97% 91%    Exam:  Constitutional:   . Appears calm and comfortable ENMT:  . grossly normal hearing  Respiratory:  . CTA bilaterally, no w/r/r.  . Respiratory effort normal.  Cardiovascular:  . RRR, no m/r/g Psychiatric:  . Mental status o Mood, affect appropriate  I have personally reviewed the following:   Today's Data  . Potassium within normal limits . Hemoglobin stable at 9.0 . Platelets down to 82  Scheduled Meds: . sodium chloride   Intravenous Once  . sodium chloride   Intravenous Once  . amiodarone  200 mg Oral Daily  . amLODipine  5 mg Oral Daily  . apixaban  2.5 mg Oral BID  . vitamin C  500 mg Oral Daily  . atorvastatin  20 mg Oral q1800  . chlorhexidine  15 mL Mouth Rinse BID  . Chlorhexidine Gluconate Cloth  6 each Topical Q0600  . cloNIDine  0.1  mg Oral TID  . darbepoetin (ARANESP) injection - DIALYSIS  150 mcg Intravenous Q Fri-HD  . haloperidol lactate  2 mg Intravenous Once  . insulin aspart  0-5 Units Subcutaneous QHS  . insulin aspart  0-9 Units Subcutaneous TID WC  . mouth rinse  15 mL Mouth Rinse q12n4p  . metoprolol tartrate  12.5 mg Oral BID  . pantoprazole  40 mg Oral Daily  . sodium chloride flush  10-40 mL Intracatheter Q12H  . sodium chloride flush  3 mL Intravenous Q12H  . zinc sulfate  220 mg Oral Daily   Continuous Infusions:  Principal Problem:   Acute hypoxemic  respiratory failure due to COVID-19 [facility]) Active Problems:   Essential hypertension   HLD (hyperlipidemia)   DM (diabetes mellitus), type 2 with renal complications (HCC)   Chronic diastolic HF (heart failure) (HCC)   CAP (community acquired pneumonia)   CKD (chronic kidney disease), stage IV (HCC)   AKI (acute kidney injury) ([place])   Sepsis ([place])   Atrial fibrillation with RVR ([place])   Delirium   Thrombocytopenia ([place])   ESRD (end stage renal disease) ([place])   LOS: 18 days   How to contact the [facility] Attending or Consulting provider 7A - 7P or covering provider during after hours 7P -7A, for this patient?  1. Check the care team in [facility] and look for a) attending/consulting TRH provider listed and b) the [facility] team listed 2. Log into [email or web address] and use [place]'s universal password to access. If you do not have the password, please contact the hospital operator. 3. Locate the [facility] provider you are looking for under [facility] and page to a number that you can be directly reached. 4. If you still have difficulty reaching the provider, please page the [facility] (Director on Call) for the Hospitalists listed on amion for assistance.

## 2020-08-12 NOTE — Progress Notes (Signed)
[facility] Progress Note   Assessment/ Plan:   # AKIon CKD stage IV--> new ESRD: In setting of sepsis due to covid-19 and CAP. Baseline Scr 2.7-3. Renal US without obstruction but chronicity. No improvement with IVF's and rising BUN/Cr.  Started dialysis on 12/22 after placement of temporary HD catheter.  Since then she has been tolerating dialysis well.   The HD catheter was converted to tunnel on 12/28. Dialysis maintaining MWF schedule. She is accepted at [facility] MWF -Continue dialysis MWF Plan for permanent access placement with AVF when Covid negative, as outpatient.  #Acute hypoxemic respiratory failure due to covid-19 and community acquired PNA-continue management per primary team, on RA now  # Sepsis due to covid-19 and CAP-  Mgmt per primary team  # Atrial fib with RVR- started on amiodarone per Cardiology.  # Acute metabolic encephalopathy- likely multifactorial with AKI, covid-19, hypoxia, and sepsis.  Overall stable/ improved  # Anemia of CKD follow H/H and iron stores. - Aranesp added.    # HTN-blood pressure acceptable at this time  # Dispo: OK for d/c from renal perspective  Subjective:    Chart review only.  HD today with 1.7L off.     Objective:   BP (!) 120/58 (BP Location: Right Arm)   Pulse 84   Temp (!) 97.4 F (36.3 C) (Oral)   Resp (!) 22   Ht 5\' 1"  (1.549 m)   Wt 62.8 kg   SpO2 97%   BMI 26.16 kg/m   Physical Exam:  See exam from 08/11/20 Gen: NAD, sitting up in chair CVS: RRR Resp: clear Abd: soft Ext:no LE edema ACCESS: [facility]  Labs: BMET Recent Labs  Lab 08/06/20 0130 08/07/20 0230 08/10/20 0408 08/12/20 0919  NA 140 136 135 134*  K 3.9 4.5 3.5 4.1  CL 103 100 97* 97*  CO2 26 24 24 26   GLUCOSE 48* 123* 36* 206*  BUN 46* 65* 62* 45*  CREATININE 4.65* 5.89* 6.08* 5.61*  CALCIUM 8.6* 8.3* 7.9* 7.3*  PHOS 4.9* 6.7* 5.9* 4.0   CBC Recent Labs  Lab 08/06/20 0130 08/07/20 0230 08/07/20 0647  08/08/20 0316 08/10/20 0408 08/12/20 0919  WBC 15.8* 14.6* 15.6* 14.5* 10.2 9.0  NEUTROABS 14.7* 13.8*  --  12.8*  --   --   HGB 7.0* 6.9* 7.5* 10.3* 10.1* 9.0*  HCT 22.2* 21.7* 24.2* 30.3* 30.2* 28.1*  MCV 71.2* 71.4* 71.8* 73.2* 74.9* 77.6*  PLT 226 188 228 202 118* 82*      Medications:    . sodium chloride   Intravenous Once  . sodium chloride   Intravenous Once  . amiodarone  200 mg Oral Daily  . amLODipine  5 mg Oral Daily  . apixaban  2.5 mg Oral BID  . vitamin C  500 mg Oral Daily  . atorvastatin  20 mg Oral q1800  . chlorhexidine  15 mL Mouth Rinse BID  . Chlorhexidine Gluconate Cloth  6 each Topical Q0600  . cloNIDine  0.1 mg Oral TID  . darbepoetin (ARANESP) injection - DIALYSIS  150 mcg Intravenous Q Fri-HD  . haloperidol lactate  2 mg Intravenous Once  . heparin sodium (porcine)      . insulin aspart  0-5 Units Subcutaneous QHS  . insulin aspart  0-9 Units Subcutaneous TID WC  . mouth rinse  15 mL Mouth Rinse q12n4p  . metoprolol tartrate  12.5 mg Oral BID  . pantoprazole  40 mg Oral Daily  .  sodium chloride flush  10-40 mL Intracatheter Q12H  . sodium chloride flush  3 mL Intravenous Q12H  . zinc sulfate  220 mg Oral Daily     Madelon Lips, MD 08/12/2020, 1:29 PM

## 2020-08-12 NOTE — Progress Notes (Signed)
Per secure chat w/RN, RN to put in consult for dressing change to add biopatch and redress when patient is in bed.

## 2020-08-12 NOTE — Progress Notes (Signed)
Occupational Therapy Treatment Patient Details Name: [name] [name] MRN: [id number] DOB: 1936-02-28 Today's Date: 08/12/2020    History of present illness Pt is an 85 y.o. female admitted 07/25/20 with 5 days of cough, lethargy, confusion; found to be hypoxic and tested (+) COVID-19, also with AKI, afib with RVR, sepsis. Pt requiring HD initiation and  with permanent HD catheter placed 08/04/20. PMH includes RA, CKD 4, DM2, HTN, OSA on CPAP, TIA, CHF.   OT comments  Pt pleasantly confused today, presents seated in recliner agreeable to therapy session. Pt tolerating multiple bouts of short distance mobility in room (approx 5' x2, and approx 12' x2), completing with minA (via HHA) throughout and with seated rest in between. Pt able to perform toileting ADL with minA throughout. VSS on RA during session though difficult to maintain good wave pleth. Given cognitive deficits and lack of caregiver support continue to recommend SNF level therapies at time of discharge to maximize her overall safety and independence with ADL and mobility. Acute OT to follow.    Follow Up Recommendations  SNF;Supervision/Assistance - 24 hour    Equipment Recommendations  3 in 1 bedside commode          Precautions / Restrictions Precautions Precautions: Fall Restrictions Weight Bearing Restrictions: No       Mobility Bed Mobility               General bed mobility comments: OOB in recliner upon arrival  Transfers Overall transfer level: Needs assistance Equipment used: None;1 person hand held assist Transfers: Sit to/from Stand Sit to Stand: Min assist         General transfer comment: boosting and steadying assist. VCs for safe hand placement and general safety with transitions    Balance Overall balance assessment: Needs assistance Sitting-balance support: Feet supported Sitting balance-Leahy Scale: Fair     Standing balance support: Single extremity supported;During functional  activity Standing balance-Leahy Scale: Poor Standing balance comment: reliant on external assist                           ADL either performed or assessed with clinical judgement   ADL Overall ADL's : Needs assistance/impaired     Grooming: Set up;Supervision/safety;Wash/dry hands;Sitting                   Toilet Transfer: Minimal assistance;Ambulation;BSC Toilet Transfer Details (indicate cue type and reason): short distance mobility (approx 5') to [facility] placed in room Toileting- Clothing Manipulation and Hygiene: Minimal assistance;Sitting/lateral lean;Sit to/from stand Toileting - Clothing Manipulation Details (indicate cue type and reason): pt able to perform posterior pericare via lateral/leans; minA for standing balance and for gown management     Functional mobility during ADLs: Minimal assistance (HHA) General ADL Comments: pt mobilizing to [facility] for toileting, back to recliner. after seated rest mobilized around EOB to chair placed near/in front of sink, seated rest and then back to recliner next to window. minA via HHA throughout                       Cognition Arousal/Alertness: Awake/alert Behavior During Therapy: WFL for tasks assessed/performed Overall Cognitive Status: No family/caregiver present to determine baseline cognitive functioning Area of Impairment: Orientation;Attention;Memory;Following commands;Safety/judgement;Awareness;Problem solving                 Orientation Level: Disoriented to;Place;Time;Situation ("how many rooms are in this house?") Current Attention Level: Focused;Sustained Memory: Decreased short-term memory;Decreased  recall of precautions Following Commands: Follows one step commands inconsistently;Follows one step commands with increased time Safety/Judgement: Decreased awareness of safety;Decreased awareness of deficits Awareness: Intellectual Problem Solving: Slow processing;Decreased initiation;Difficulty  sequencing;Requires verbal cues;Requires tactile cues General Comments: pt pleasantly confused today. thinking we were in a house. pt stating the year is 1919 despite being born in 50s. pt did follow commands fairly well given multimodal cues and cues for redirection, able to verbalize she needed to use the [facility]        Exercises     Shoulder Instructions       General Comments VSS on RA, though difficult to maintain good pleth with mobility/activity (measured via earlobe)    Pertinent Vitals/ Pain       Pain Assessment: No/denies pain  Home Living                                          Prior Functioning/Environment              Frequency  Min 2X/week        Progress Toward Goals  OT Goals(current goals can now be found in the care plan section)  Progress towards OT goals: Progressing toward goals  Acute Rehab OT Goals Patient Stated Goal: family now agreeable to SNF per chart OT Goal Formulation: With patient Time For Goal Achievement: 08/26/20 Potential to Achieve Goals: Good ADL Goals Pt Will Perform Grooming: with supervision;standing Pt Will Perform Upper Body Dressing: with set-up;sitting Pt Will Perform Lower Body Dressing: with min guard assist;sit to/from stand Pt Will Transfer to Toilet: with min guard assist;ambulating Pt Will Perform Toileting - Clothing Manipulation and hygiene: with supervision;sit to/from stand Pt/caregiver will Perform Home Exercise Program: Both right and left upper extremity;With theraband;With Supervision;With written HEP provided  Plan Discharge plan remains appropriate    Co-evaluation                 AM-PAC OT "6 Clicks" Daily Activity     Outcome Measure   Help from another person eating meals?: A Little Help from another person taking care of personal grooming?: A Little Help from another person toileting, which includes using toliet, bedpan, or urinal?: A Lot Help from another person  bathing (including washing, rinsing, drying)?: A Lot Help from another person to put on and taking off regular upper body clothing?: A Little Help from another person to put on and taking off regular lower body clothing?: A Lot 6 Click Score: 15    End of Session Equipment Utilized During Treatment: Gait belt  OT Visit Diagnosis: Unsteadiness on feet (R26.81);Other abnormalities of gait and mobility (R26.89);Muscle weakness (generalized) (M62.81);Other symptoms and signs involving cognitive function   Activity Tolerance Patient tolerated treatment well   Patient Left in chair;with call bell/phone within reach;with chair alarm set   Nurse Communication Mobility status        Time: 4081-4481 OT Time Calculation (min): 30 min  Charges: OT General Charges $OT Visit: 1 Visit OT Treatments $Self Care/Home Management : 8-22 mins $Therapeutic Activity: 8-22 mins  [name] [name], OT Acute Rehabilitation Services Pager [phone] Office [phone]    [name] [name] 08/12/2020, 5:05 PM

## 2020-08-13 DIAGNOSIS — I4891 Unspecified atrial fibrillation: Secondary | ICD-10-CM | POA: Diagnosis not present

## 2020-08-13 DIAGNOSIS — E1121 Type 2 diabetes mellitus with diabetic nephropathy: Secondary | ICD-10-CM | POA: Diagnosis not present

## 2020-08-13 DIAGNOSIS — D696 Thrombocytopenia, unspecified: Secondary | ICD-10-CM | POA: Diagnosis not present

## 2020-08-13 LAB — CBC
HCT: 27.2 % — ABNORMAL LOW (ref 36.0–46.0)
Hemoglobin: 9.1 g/dL — ABNORMAL LOW (ref 12.0–15.0)
MCH: 25.3 pg — ABNORMAL LOW (ref 26.0–34.0)
MCHC: 33.5 g/dL (ref 30.0–36.0)
MCV: 75.8 fL — ABNORMAL LOW (ref 80.0–100.0)
Platelets: 80 10*3/uL — ABNORMAL LOW (ref 150–400)
RBC: 3.59 MIL/uL — ABNORMAL LOW (ref 3.87–5.11)
RDW: 21.5 % — ABNORMAL HIGH (ref 11.5–15.5)
WBC: 8.8 10*3/uL (ref 4.0–10.5)
nRBC: 0 % (ref 0.0–0.2)

## 2020-08-13 LAB — GLUCOSE, CAPILLARY
Glucose-Capillary: 151 mg/dL — ABNORMAL HIGH (ref 70–99)
Glucose-Capillary: 123 mg/dL — ABNORMAL HIGH (ref 70–99)
Glucose-Capillary: 196 mg/dL — ABNORMAL HIGH (ref 70–99)
Glucose-Capillary: 156 mg/dL — ABNORMAL HIGH (ref 70–99)

## 2020-08-13 LAB — PARATHYROID HORMONE, INTACT (NO CA): PTH: 507 pg/mL — ABNORMAL HIGH (ref 15–65)

## 2020-08-13 MED ORDER — INSULIN DETEMIR 100 UNIT/ML ~~LOC~~ SOLN
5.0000 [IU] | Freq: Every day | SUBCUTANEOUS | Status: DC
  Administered 2020-08-13: 5 [IU] via SUBCUTANEOUS
  Filled 2020-08-13 (×3): qty 0.05

## 2020-08-13 NOTE — Care Management (Signed)
Patient suffers from generalized weakness due to COVID which impairs their ability to perform daily activities lADL's in the home. A walking aid will not resolve  issue with performing activities of daily living. A wheelchair will allow patient to safely perform daily activities. Patient is not able to propel themselves in the home using a standard weight wheelchair due to weakness. Patient can self propel in the lightweight wheelchair. Length of need lifetime  Accessories: elevating leg rests (ELRs), wheel locks, extensions and anti-tippers.  Evelina Dun DNP, MSN, RN

## 2020-08-13 NOTE — Progress Notes (Signed)
PROGRESS NOTE  [name] [name] FMB:[id number] DOB: 15-Aug-1935 DOA: 07/25/2020 PCP: [name], MD  Brief History   85 year old woman hospitalized for acute hypoxic respiratory failure secondary to Covid pneumonia, suspected bacterial superinfection, complicated by progression of CKD to end-stage renal disease and new start of hemodialysis, atrial fibrillation with rapid ventricular response.  Hospitalization prolonged waiting for SNF placement.  This was denied and denied on appeal 1/6.  Plan for home 1/7 if platelets stable.  A & P  Acute hypoxic respiratory failure secondary to sepsis, Covid pneumonia, suspected bacterial superinfection. --Resolved.  Completed treatment, off antibiotics, off steroids. --Appears stable  CKD stage IV with progression end-stage renal disease, started on hemodialysis this admission. --Continue HD per nephrology. --No new issues noted  Acute atrial fibrillation with rapid ventricular response, seen by cardiology, continue apixaban, metoprolol and amiodarone. --Continue current management  Thrombocytopenia --New over the last 72 hours. --May be stabilizing at this point.  Repeat CBC in a.m.  Diabetes mellitus type 2 with hyperglycemia --Remains stable. Continue SSI.  Anemia of acute illness, chronic kidney disease --Remains stable.  Continue Aranesp per nephrology.  Acute metabolic encephalopathy --Mild confusion but appears stable.  Disposition Plan:  Discussion: Thrombocytopenia may be stabilizing.  Repeat CBC in a.m.  Otherwise patient appears medically stable.  Per case management, insurance company refused SNF authorization.  Plan for home with daughter when stable.  Status is: Inpatient  Remains inpatient appropriate because:Inpatient level of care appropriate due to severity of illness   Dispo: The patient is from: Home              Anticipated d/c is to: SNF              Anticipated d/c date is: 1 day              Patient  currently is not medically stable to d/c.  DVT prophylaxis: apixaban (ELIQUIS) tablet 2.5 mg Start: 08/05/20 1430 apixaban (ELIQUIS) tablet 2.5 mg    Code Status: Full Code Family Communication: none  Murray Hodgkins, MD  [facility] Direct contact: see [email or web address] (further directions at bottom of note if needed) 7PM-7AM contact night coverage as at bottom of note 08/13/2020, 6:14 PM  LOS: 19 days   Consults:  . Cardiology  . Nephrology  . IR   Procedures:   non-tunneled HD catheter   Conversion of temp cath to 19 cm Palindrome tunneled HD catheter  Interval History/Subjective  CC: f/u COVID  Feels fine No complaints reported   Objective   Vitals:  Vitals:   08/13/20 1232 08/13/20 1614  BP: (!) 108/54 (!) 111/49  Pulse: 83 87  Resp: 19 17  Temp: 98.7 F (37.1 C) 98.4 F (36.9 C)  SpO2: 95% 90%    Exam:  Constitutional:   . Appears calm and comfortable sitting in chair ENMT:  . grossly normal hearing  Respiratory:  . CTA bilaterally, no w/r/r.  . Respiratory effort normal.  Cardiovascular:  . RRR, no m/r/g Psychiatric:  . Mental status o Mood, affect appropriate  I have personally reviewed the following:   Today's Data  . CBG stable . Hgb stable 9.1 . Plts stable at 80  Scheduled Meds: . sodium chloride   Intravenous Once  . sodium chloride   Intravenous Once  . amiodarone  200 mg Oral Daily  . amLODipine  5 mg Oral Daily  . apixaban  2.5 mg Oral BID  . vitamin C  500 mg Oral Daily  .  atorvastatin  20 mg Oral q1800  . chlorhexidine  15 mL Mouth Rinse BID  . Chlorhexidine Gluconate Cloth  6 each Topical Q0600  . cloNIDine  0.1 mg Oral TID  . darbepoetin (ARANESP) injection - DIALYSIS  150 mcg Intravenous Q Fri-HD  . haloperidol lactate  2 mg Intravenous Once  . insulin aspart  0-5 Units Subcutaneous QHS  . insulin aspart  0-9 Units Subcutaneous TID WC  . mouth rinse  15 mL Mouth Rinse q12n4p  . metoprolol tartrate  12.5 mg Oral BID   . pantoprazole  40 mg Oral Daily  . sodium chloride flush  10-40 mL Intracatheter Q12H  . sodium chloride flush  3 mL Intravenous Q12H  . zinc sulfate  220 mg Oral Daily   Continuous Infusions:  Principal Problem:   Acute hypoxemic respiratory failure due to COVID-19 [facility]) Active Problems:   Essential hypertension   HLD (hyperlipidemia)   DM (diabetes mellitus), type 2 with renal complications (HCC)   Chronic diastolic HF (heart failure) (HCC)   CAP (community acquired pneumonia)   CKD (chronic kidney disease), stage IV (HCC)   AKI (acute kidney injury) ([place])   Sepsis ([place])   Atrial fibrillation with RVR ([place])   Delirium   Thrombocytopenia ([place])   ESRD (end stage renal disease) ([place])   LOS: 19 days   How to contact the [facility] Attending or Consulting provider 7A - 7P or covering provider during after hours 7P -7A, for this patient?  1. Check the care team in [facility] and look for a) attending/consulting TRH provider listed and b) the [facility] team listed 2. Log into [email or web address] and use [place]'s universal password to access. If you do not have the password, please contact the hospital operator. 3. Locate the [facility] provider you are looking for under [facility] and page to a number that you can be directly reached. 4. If you still have difficulty reaching the provider, please page the [facility] (Director on Call) for the Hospitalists listed on amion for assistance.

## 2020-08-13 NOTE — Progress Notes (Signed)
[facility] Progress Note   Assessment/ Plan:   # AKIon CKD stage IV--> new ESRD: In setting of sepsis due to covid-19 and CAP. Baseline Scr 2.7-3. Renal US without obstruction but chronicity. No improvement with IVF's and rising BUN/Cr.  Started dialysis on 12/22 after placement of temporary HD catheter.  Since then she has been tolerating dialysis well.   The HD catheter was converted to tunnel on 12/28. Dialysis maintaining MWF schedule. She is accepted at [facility] MWF -Continue dialysis MWF, next HD planned 08/14/20  Plan for permanent access placement with AVF when Covid negative, as outpatient.  #Acute hypoxemic respiratory failure due to covid-19 and community acquired PNA-continue management per primary team, on RA now  # Sepsis due to covid-19 and CAP-  Mgmt per primary team  # Atrial fib with RVR- started on amiodarone per Cardiology.  # Acute metabolic encephalopathy- likely multifactorial with AKI, covid-19, hypoxia, and sepsis.  Overall stable/ improved  # Anemia of CKD follow H/H and iron stores. - Aranesp added.    # HTN-blood pressure acceptable at this time  # Dispo: OK for d/c from renal perspective  Subjective:    Hungry, wants to eat lunch   Objective:   BP (!) 108/54 (BP Location: Right Arm)   Pulse 83   Temp 98.7 F (37.1 C) (Oral)   Resp 19   Ht 5\' 1"  (1.549 m)   Wt 65.4 kg   SpO2 95%   BMI 27.24 kg/m   Physical Exam:   Gen: NAD, sitting up in chair CVS: RRR Resp: clear Abd: soft Ext:no LE edema ACCESS: RIJ [facility]  Labs: BMET Recent Labs  Lab 08/07/20 0230 08/10/20 0408 08/12/20 0919  NA 136 135 134*  K 4.5 3.5 4.1  CL 100 97* 97*  CO2 24 24 26   GLUCOSE 123* 36* 206*  BUN 65* 62* 45*  CREATININE 5.89* 6.08* 5.61*  CALCIUM 8.3* 7.9* 7.3*  PHOS 6.7* 5.9* 4.0   CBC Recent Labs  Lab 08/07/20 0230 08/07/20 0647 08/08/20 0316 08/10/20 0408 08/12/20 0919 08/13/20 0133  WBC 14.6*   < > 14.5* 10.2 9.0 8.8   NEUTROABS 13.8*  --  12.8*  --   --   --   HGB 6.9*   < > 10.3* 10.1* 9.0* 9.1*  HCT 21.7*   < > 30.3* 30.2* 28.1* 27.2*  MCV 71.4*   < > 73.2* 74.9* 77.6* 75.8*  PLT 188   < > 202 118* 82* 80*   < > = values in this interval not displayed.      Medications:    . sodium chloride   Intravenous Once  . sodium chloride   Intravenous Once  . amiodarone  200 mg Oral Daily  . amLODipine  5 mg Oral Daily  . apixaban  2.5 mg Oral BID  . vitamin C  500 mg Oral Daily  . atorvastatin  20 mg Oral q1800  . chlorhexidine  15 mL Mouth Rinse BID  . Chlorhexidine Gluconate Cloth  6 each Topical Q0600  . cloNIDine  0.1 mg Oral TID  . darbepoetin (ARANESP) injection - DIALYSIS  150 mcg Intravenous Q Fri-HD  . haloperidol lactate  2 mg Intravenous Once  . insulin aspart  0-5 Units Subcutaneous QHS  . insulin aspart  0-9 Units Subcutaneous TID WC  . mouth rinse  15 mL Mouth Rinse q12n4p  . metoprolol tartrate  12.5 mg Oral BID  . pantoprazole  40 mg Oral Daily  .  sodium chloride flush  10-40 mL Intracatheter Q12H  . sodium chloride flush  3 mL Intravenous Q12H  . zinc sulfate  220 mg Oral Daily     Madelon Lips, MD 08/13/2020, 2:56 PM

## 2020-08-13 NOTE — TOC Progression Note (Addendum)
Transition of Care [facility]) - Progression Note    Patient Details  Name: [name] [name] MRN: [id number] Date of Birth: 1935/10/06  Transition of Care [facility]) CM/SW [place], LCSW Phone Number: 08/13/2020, 11:04 AM  Clinical Narrative:    11am-CSW received denial from [facility] for SNF. Will follow up with appeals process and family.   12:11pm-After 45 minutes on the phone, CSW was able to complete [employer] appeal. Determination could take up to 72 hours.   4pm-CSW spoke with patient's daughter, [name]. She reported that since SNF was denied, she would like to bring patient home. She requested home health, wheelchair, and 3in1 BSC. CSW appreciates RNCM's assistance arranging that. Daughter will pick patient up by car tomorrow. Patient will need to go to her dialysis appointment at [facility] tomorrow at 5:30pm.  Expected Discharge Plan: Skilled Nursing Facility Barriers to Discharge: SNF Pending bed offer,Insurance Authorization  Expected Discharge Plan and Services Expected Discharge Plan: [place] In-house Referral: Clinical Social Work Discharge Planning Services: CM Consult   Living arrangements for the past 2 months: [place]: Well [place] Date [place]: 08/05/20 Time [place]: [phone] Representative spoke with at [place]: Per Karsten Fells [facility] will be week of Jan 3rd   Social Determinants of Health (SDOH) Interventions    Readmission Risk Interventions No flowsheet data found.

## 2020-08-13 NOTE — Progress Notes (Signed)
Physical Therapy Treatment Patient Details Name: [name] [name] MRN: [id number] DOB: Apr 22, 1936 Today's Date: 08/13/2020    History of Present Illness Pt is an 85 y.o. female admitted 07/25/20 with 5 days of cough, lethargy, confusion; found to be hypoxic and tested (+) COVID-19, also with AKI, afib with RVR, sepsis. Pt requiring HD initiation and  with permanent HD catheter placed 08/04/20. PMH includes RA, CKD 4, DM2, HTN, OSA on CPAP, TIA, CHF.   PT Comments    Pt progressing with mobility. Improved alertness and command following; pt still pleasantly confused/disoriented with difficulty problem solving and decreased awareness. Performed gait training with RW and minA; pt limited by generalized weakness and decreased activity tolerance; at high risk for falls. Continue to recommend SNF-level therapies to maximize functional mobility and independence. If family to have pt return home, will require 24/7 assist.    Follow Up Recommendations  SNF;Supervision/Assistance - 24 hour     Equipment Recommendations  3in1 (PT)    Recommendations for Other Services       Precautions / Restrictions Precautions Precautions: Fall Restrictions Weight Bearing Restrictions: No    Mobility  Bed Mobility               General bed mobility comments: OOB in recliner upon arrival  Transfers Overall transfer level: Needs assistance Equipment used: Rolling walker (2 wheeled) Transfers: Sit to/from Stand Sit to Stand: Min assist;Min guard         General transfer comment: Initial minA to assist trunk elevation standing from recliner to RW; additional trial with cues for hand placement, min guard for safety  Ambulation/Gait Ambulation/Gait assistance: Min guard;Min assist Gait Distance (Feet): 100 Feet Assistive device: Rolling walker (2 wheeled) Gait Pattern/deviations: Step-through pattern;Decreased stride length;Trunk flexed Gait velocity: Decreased   General Gait Details:  Slow, fatigued, mildly unsteady gait with close min guard for balance; pt with poor insight into activity tolerance and quick to fatigue; improved RW management compared to prior hallway ambulation, although still requiring intermittent minA for RW navigation around objects   Stairs             Wheelchair Mobility    Modified Rankin (Stroke Patients Only)       Balance Overall balance assessment: Needs assistance Sitting-balance support: Feet supported Sitting balance-Leahy Scale: Fair     Standing balance support: Bilateral upper extremity supported;No upper extremity supported;During functional activity Standing balance-Leahy Scale: Poor Standing balance comment: Reliant on UE support or external assist                            Cognition Arousal/Alertness: Awake/alert Behavior During Therapy: WFL for tasks assessed/performed;Flat affect Overall Cognitive Status: No family/caregiver present to determine baseline cognitive functioning Area of Impairment: Orientation;Attention;Memory;Following commands;Safety/judgement;Awareness;Problem solving                 Orientation Level: Disoriented to;Place;Time;Situation Current Attention Level: Sustained Memory: Decreased short-term memory;Decreased recall of precautions Following Commands: Follows one step commands with increased time Safety/Judgement: Decreased awareness of safety;Decreased awareness of deficits Awareness: Intellectual Problem Solving: Slow processing;Decreased initiation;Difficulty sequencing;Requires verbal cues;Requires tactile cues General Comments: Pleasantly confused; improved alertness, conversation and command following this session      Exercises      General Comments        Pertinent Vitals/Pain Pain Assessment: No/denies pain    Home Living  Prior Function            PT Goals (current goals can now be found in the care plan section)  Progress towards PT goals: Progressing toward goals    Frequency    Min 2X/week      PT Plan Current plan remains appropriate    Co-evaluation              AM-PAC PT "6 Clicks" Mobility   Outcome Measure  Help needed turning from your back to your side while in a flat bed without using bedrails?: A Lot Help needed moving from lying on your back to sitting on the side of a flat bed without using bedrails?: A Lot Help needed moving to and from a bed to a chair (including a wheelchair)?: A Little Help needed standing up from a chair using your arms (e.g., wheelchair or bedside chair)?: A Little Help needed to walk in hospital room?: A Little Help needed climbing 3-5 steps with a railing? : A Lot 6 Click Score: 15    End of Session Equipment Utilized During Treatment: Gait belt Activity Tolerance: Patient tolerated treatment well;Patient limited by fatigue Patient left: in chair;with call bell/phone within reach;with chair alarm set Nurse Communication: Mobility status PT Visit Diagnosis: Other abnormalities of gait and mobility (R26.89);Muscle weakness (generalized) (M62.81)     Time: 4332-9518 PT Time Calculation (min) (ACUTE ONLY): 20 min  Charges:  $Gait Training: 8-22 mins                    Mabeline Caras, PT, DPT Acute Rehabilitation Services  Pager [phone] Office [place] 08/13/2020, 1:22 PM

## 2020-08-14 DIAGNOSIS — U071 COVID-19: Secondary | ICD-10-CM | POA: Diagnosis not present

## 2020-08-14 DIAGNOSIS — N179 Acute kidney failure, unspecified: Secondary | ICD-10-CM | POA: Diagnosis not present

## 2020-08-14 DIAGNOSIS — N186 End stage renal disease: Secondary | ICD-10-CM | POA: Diagnosis not present

## 2020-08-14 DIAGNOSIS — Z992 Dependence on renal dialysis: Secondary | ICD-10-CM | POA: Diagnosis not present

## 2020-08-14 DIAGNOSIS — A419 Sepsis, unspecified organism: Secondary | ICD-10-CM | POA: Diagnosis not present

## 2020-08-14 DIAGNOSIS — E1121 Type 2 diabetes mellitus with diabetic nephropathy: Secondary | ICD-10-CM | POA: Diagnosis not present

## 2020-08-14 DIAGNOSIS — J1282 Pneumonia due to coronavirus disease 2019: Secondary | ICD-10-CM | POA: Diagnosis not present

## 2020-08-14 DIAGNOSIS — J9601 Acute respiratory failure with hypoxia: Secondary | ICD-10-CM | POA: Diagnosis not present

## 2020-08-14 DIAGNOSIS — E1129 Type 2 diabetes mellitus with other diabetic kidney complication: Secondary | ICD-10-CM | POA: Diagnosis not present

## 2020-08-14 DIAGNOSIS — I129 Hypertensive chronic kidney disease with stage 1 through stage 4 chronic kidney disease, or unspecified chronic kidney disease: Secondary | ICD-10-CM | POA: Diagnosis not present

## 2020-08-14 DIAGNOSIS — T8249XS Other complication of vascular dialysis catheter, sequela: Secondary | ICD-10-CM | POA: Diagnosis not present

## 2020-08-14 DIAGNOSIS — D631 Anemia in chronic kidney disease: Secondary | ICD-10-CM | POA: Diagnosis not present

## 2020-08-14 DIAGNOSIS — E869 Volume depletion, unspecified: Secondary | ICD-10-CM | POA: Diagnosis not present

## 2020-08-14 DIAGNOSIS — N184 Chronic kidney disease, stage 4 (severe): Secondary | ICD-10-CM | POA: Diagnosis not present

## 2020-08-14 DIAGNOSIS — D509 Iron deficiency anemia, unspecified: Secondary | ICD-10-CM | POA: Diagnosis not present

## 2020-08-14 LAB — RENAL FUNCTION PANEL
Albumin: 2.2 g/dL — ABNORMAL LOW (ref 3.5–5.0)
Anion gap: 9 (ref 5–15)
BUN: 29 mg/dL — ABNORMAL HIGH (ref 8–23)
CO2: 29 mmol/L (ref 22–32)
Calcium: 7.5 mg/dL — ABNORMAL LOW (ref 8.9–10.3)
Chloride: 96 mmol/L — ABNORMAL LOW (ref 98–111)
Creatinine, Ser: 5.37 mg/dL — ABNORMAL HIGH (ref 0.44–1.00)
GFR, Estimated: 7 mL/min — ABNORMAL LOW (ref >60)
Glucose, Bld: 173 mg/dL — ABNORMAL HIGH (ref 70–99)
Phosphorus: 3.7 mg/dL (ref 2.5–4.6)
Potassium: 4.1 mmol/L (ref 3.5–5.1)
Sodium: 134 mmol/L — ABNORMAL LOW (ref 135–145)

## 2020-08-14 LAB — GLUCOSE, CAPILLARY
Glucose-Capillary: 78 mg/dL (ref 70–99)
Glucose-Capillary: 228 mg/dL — ABNORMAL HIGH (ref 70–99)

## 2020-08-14 LAB — CBC
HCT: 26.4 % — ABNORMAL LOW (ref 36.0–46.0)
Hemoglobin: 8.3 g/dL — ABNORMAL LOW (ref 12.0–15.0)
MCH: 24.7 pg — ABNORMAL LOW (ref 26.0–34.0)
MCHC: 31.4 g/dL (ref 30.0–36.0)
MCV: 78.6 fL — ABNORMAL LOW (ref 80.0–100.0)
Platelets: 85 10*3/uL — ABNORMAL LOW (ref 150–400)
RBC: 3.36 MIL/uL — ABNORMAL LOW (ref 3.87–5.11)
RDW: 21.4 % — ABNORMAL HIGH (ref 11.5–15.5)
WBC: 8.2 10*3/uL (ref 4.0–10.5)
nRBC: 0 % (ref 0.0–0.2)

## 2020-08-14 MED ORDER — METOPROLOL TARTRATE 25 MG PO TABS: 12.5000 mg | ORAL_TABLET | Freq: Two times a day (BID) | ORAL | 2 refills | Status: DC

## 2020-08-14 MED ORDER — INSULIN DETEMIR 100 UNIT/ML FLEXPEN: 5.0000 [IU] | PEN_INJECTOR | Freq: Every day | SUBCUTANEOUS | Status: DC

## 2020-08-14 MED ORDER — APIXABAN 2.5 MG PO TABS: 2.5000 mg | ORAL_TABLET | Freq: Two times a day (BID) | ORAL | 2 refills | Status: DC

## 2020-08-14 MED ORDER — ZINC SULFATE 220 (50 ZN) MG PO CAPS: 220.0000 mg | ORAL_CAPSULE | Freq: Every day | ORAL | Status: DC

## 2020-08-14 MED ORDER — ASCORBIC ACID 500 MG PO TABS: 500.0000 mg | ORAL_TABLET | Freq: Every day | ORAL | Status: DC

## 2020-08-14 NOTE — Care Management Important Message (Signed)
Important Message  Patient Details  Name: [name] [name] MRN: [id number] Date of Birth: 11-Oct-1935   Medicare Important Message Given:  Yes - Important Message mailed due to current National Emergency   Verbal consent obtained due to current National Emergency  Relationship to patient: Child Contact Name: Mila Palmer Call Date: 08/14/20  Time: 3662 Phone: [phone] Outcome: No Answer/Busy Important Message mailed to: Patient address on file    Delorse Lek 08/14/2020, 2:39 PM

## 2020-08-14 NOTE — TOC Transition Note (Addendum)
Transition of Care [facility]) - CM/SW Discharge Note   Patient Details  Name: [name] [name] MRN: [id number] Date of Birth: 07-04-1936  Transition of Care [facility]) CM/SW Contact:  Verdell Carmine, RN Phone Number: 08/14/2020, 9:08 AM   Clinical Narrative:     [facility] accepted patient for HH, cannot start until next week. No other offers. Will notify daughter of start time.  86 daughter aware of HH start, also of dialysis outpatient appointment today at 1745. And the address. She mentioned that the patints dentures and clothes ar emissing, RN larson aware and will look for them. Her daughter will bring new clothes and pick patient up at 4pm.   Final next level of care: [place] (Start date next week) Barriers to Discharge: SNF Pending bed offer,Insurance Authorization   Patient Goals and CMS Choice Patient states their goals for this hospitalization and ongoing recovery are:: to go home CMS Medicare.gov Compare Post Acute Care list provided to:: Patient Represenative (must comment) Choice offered to / list presented to : Adult Children  Discharge Placement                       Discharge Plan and Services In-house Referral: Clinical Social Work Discharge Planning Services: CM Consult            DME Arranged: Youth worker wheelchair with seat cushion,3-N-1 DME Agency: [employer] Date DME Agency Contacted: 08/13/20 Time DME Agency Contacted: [phone] Representative spoke with at DME Agency: [place]: RN,OT,PT,Nurse's Aide,Social Work [employer] Agency: [facility] Date [place]: 08/05/20 Time [place]: [phone] Representative spoke with at [place]: [place] start will be next week  Social Determinants of Health (SDOH) Interventions     Readmission Risk Interventions No flowsheet data found.

## 2020-08-14 NOTE — Progress Notes (Signed)
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 Baseline Scr 2.7-3. Renal US without obstruction but chronicity. No improvement with IVF's and rising BUN/Cr.  Started dialysis on 12/22 after placement of temporary HD catheter.  Since then she has been tolerating dialysis well.   The HD catheter was converted to tunnel on 12/28. Dialysis maintaining MWF schedule. She is accepted at [facility] MWF, HD today   Plan for permanent access placement with AVF when Covid negative, as outpatient.  #Acute hypoxemic respiratory failure due to covid-19 and community acquired PNA-continue management per primary team, on RA now  # Sepsis due to covid-19 and CAP-  Mgmt per primary team  # Atrial fib with RVR- started on amiodarone per Cardiology.  # Acute metabolic encephalopathy- likely multifactorial with AKI, covid-19, hypoxia, and sepsis.  Overall stable/ improved  # Anemia of CKD follow H/H and iron stores. - Aranesp added.    # HTN-blood pressure acceptable at this time  # Dispo: OK for d/c from renal perspective  Subjective:    No complaints today. Hoping to leave soon   Objective:   BP (!) 112/57 (BP Location: Right Arm)   Pulse 91   Temp 98.6 F (37 C) (Oral)   Resp 20   Ht 5\' 1"  (1.549 m)   Wt 64.5 kg   SpO2 100%   BMI 26.87 kg/m   Physical Exam:   Gen: NAD, sitting up in chair CVS: RRR Resp: no iwob Abd: soft Ext:no LE edema ACCESS: [facility]  Labs: BMET Recent Labs  Lab 08/10/20 0408 08/12/20 0919 08/14/20 1007  NA 135 134* 134*  K 3.5 4.1 4.1  CL 97* 97* 96*  CO2 24 26 29   GLUCOSE 36* 206* 173*  BUN 62* 45* 29*  CREATININE 6.08* 5.61* 5.37*  CALCIUM 7.9* 7.3* 7.5*  PHOS 5.9* 4.0 3.7   CBC Recent Labs  Lab 08/08/20 0316 08/10/20 0408 08/12/20 0919 08/13/20 0133 08/14/20 0209  WBC 14.5* 10.2 9.0 8.8 8.2  NEUTROABS 12.8*   --   --   --   --   HGB 10.3* 10.1* 9.0* 9.1* 8.3*  HCT 30.3* 30.2* 28.1* 27.2* 26.4*  MCV 73.2* 74.9* 77.6* 75.8* 78.6*  PLT 202 118* 82* 80* 85*      Medications:    . sodium chloride   Intravenous Once  . sodium chloride   Intravenous Once  . amiodarone  200 mg Oral Daily  . amLODipine  5 mg Oral Daily  . apixaban  2.5 mg Oral BID  . vitamin C  500 mg Oral Daily  . atorvastatin  20 mg Oral q1800  . chlorhexidine  15 mL Mouth Rinse BID  . Chlorhexidine Gluconate Cloth  6 each Topical Q0600  . cloNIDine  0.1 mg Oral TID  . darbepoetin (ARANESP) injection - DIALYSIS  150 mcg Intravenous Q Fri-HD  . insulin aspart  0-5 Units Subcutaneous QHS  . insulin aspart  0-9 Units Subcutaneous TID WC  . insulin detemir  5 Units Subcutaneous QHS  . mouth rinse  15 mL Mouth Rinse q12n4p  . metoprolol tartrate  12.5 mg Oral BID  . pantoprazole  40 mg Oral Daily  . sodium chloride flush  10-40 mL Intracatheter Q12H  . sodium chloride flush  3 mL Intravenous Q12H  . zinc sulfate  220 mg Oral Daily  Reesa Chew  08/14/2020, 12:11 PM

## 2020-08-14 NOTE — Discharge Summary (Addendum)
Physician Discharge Summary  [name] [name] UXL:[id number] DOB: [birth year] DOA: 07/25/2020  PCP: [name], MD  Admit date: 07/25/2020 Discharge date: 08/14/2020  Recommendations for Outpatient Follow-up:   Acute atrial fibrillation with rapid ventricular response, seen by cardiology, continue apixaban, metoprolol and amiodarone. --stable, will stop amiodarone as per cardiology rec as afib was thought to be secondary to resp drive. Consider stopping anticoagulation as an outpatient. --ambulatory referral placed for cardiology  Thrombocytopenia --new last several days, now stable, follow-up as an outpatient.  Diabetes mellitus type 2 with hyperglycemia --Remains stable. Resume Levemir lower dose on discharge and titrate up for hyperglycemia per PCP    Contact information for follow-up providers    [facility], [place] on 08/14/2020.   Why: 5:30pm dialysis appointment on 1/7. On Monday, 1/10, you will resume your regular 11:10am arrival time (on Mondays, Wednesdays, and Fridays). Contact information: [place] [address] [place], [facility] Follow up.   Specialty: [place] Why: your home health provider, as discussed will not be able to come until next week , they will call you Contact information: [place] [address] [place], [facility] Follow up.   Why: your wheelchair and bedside commode agency Contact information: [address] [place] [address] [phone]            Contact information for after-discharge care    Destination    [facility] .   Service: Skilled Nursing Contact information: [address] [place] [phone]                   Discharge Diagnoses: Principal diagnosis is #1 Principal Problem:   Acute  hypoxemic respiratory failure due to COVID-19 [facility]) Active Problems:   Essential hypertension   HLD (hyperlipidemia)   DM (diabetes mellitus), type 2 with renal complications (HCC)   Chronic diastolic HF (heart failure) (HCC)   CAP (community acquired pneumonia)   CKD (chronic kidney disease), stage IV (HCC)   AKI (acute kidney injury) ([place])   Sepsis ([place])   Atrial fibrillation with RVR ([place])   Delirium   Thrombocytopenia ([place])   ESRD (end stage renal disease) ([place])   Discharge Condition: improved Disposition: home w/ HH  Diet recommendation:  Diet Orders (From admission, onward)    Start     Ordered   08/09/20 1325  DIET - DYS 1 Room service appropriate? Yes; Fluid consistency: Thin  Diet effective now       Question Answer Comment  Room service appropriate? Yes   Fluid consistency: Thin      08/09/20 1324           Filed Weights   08/12/20 1237 08/13/20 0400 08/14/20 0352  Weight: 62.8 kg 65.4 kg 64.5 kg    HPI/Hospital Course:   85 year old woman hospitalized for acute hypoxic respiratory failure secondary to Covid pneumonia, suspected bacterial superinfection, complicated by progression of CKD to end-stage renal disease and new start of hemodialysis, atrial fibrillation with rapid ventricular response.  Hospitalization prolonged waiting for SNF placement.  This was denied and denied on appeal 1/6.  Home w/ HH max. I spoke w/ 2 daughters by telephone.  A & P  Acute hypoxic respiratory failure secondary to sepsis, Covid pneumonia, suspected bacterial superinfection. --Resolved, completed treatment, off  antibiotics, off steroids. --remains stable  CKD stage IV with progression end-stage renal disease, started on hemodialysis this admission. --next HD as outpt this evening  Acute atrial fibrillation with rapid ventricular response, seen by cardiology, continue apixaban, metoprolol and amiodarone. --stable, will stop amiodarone as per cardiology rec as afib was  thought to be secondary to resp drive. Consider stopping anticoagulation as an outpatient.  Thrombocytopenia --new last several days, now stable, follow-up as an outpatient.  Diabetes mellitus type 2 with hyperglycemia --Remains stable. Resume Levemir lower dose on discharge and titrate up for hyperglycemia per PCP  Anemia of acute illness, chronic kidney disease --Remains stable, continue Aranesp per nephrology.  Acute metabolic encephalopathy --Mild confusion but appears stable. Expect will slowly improve.  Consults:   Cardiology   Nephrology   IR  Procedures:   non-tunneled HD catheter   Conversion of temp cath to 19 cm Palindrome tunneled HD cathete  Today's assessment: S: CC: f/u COVID  Feels fine, no complaints  O: Vitals:  Vitals:   08/14/20 0800 08/14/20 1100  BP:  (!) 112/57  Pulse:  91  Resp: 19 20  Temp:  98.6 F (37 C)  SpO2: 99% 100%    Constitutional:  . Appears calm and comfortable ENMT:  . grossly normal hearing  Respiratory:  . CTA bilaterally, no w/r/r.  . Respiratory effort normal.  Cardiovascular:  . RRR, no m/r/g . No LE extremity edema   Psychiatric:  . Mental status o Mood, affect appropriate  Plts stable at 85 CBG stable  Discharge Instructions  Discharge Instructions    Ambulatory referral to Cardiology   Complete by: As directed    F/u afib, hospitalization.   Discharge instructions   Complete by: As directed    Call your physician or seek immediate medical attention for fever, shortness of breath, rapid heart rate, chest pain or worsening of condition. Self-isolate through 1/7. Recommend pureed diet.   Increase activity slowly   Complete by: As directed    No wound care   Complete by: As directed      Allergies as of 08/14/2020      Reactions   Penicillins Swelling, Rash      Medication List    STOP taking these medications   amLODipine 10 MG tablet Commonly known as: NORVASC   aspirin 325 MG tablet    furosemide 80 MG tablet Commonly known as: LASIX   hydrALAZINE 25 MG tablet Commonly known as: APRESOLINE   mometasone 0.1 % cream Commonly known as: Elocon     TAKE these medications   apixaban 2.5 MG Tabs tablet Commonly known as: ELIQUIS Take 1 tablet (2.5 mg total) by mouth 2 (two) times daily.   ascorbic acid 500 MG tablet Commonly known as: VITAMIN C Take 1 tablet (500 mg total) by mouth daily. Start taking on: August 15, 2020   atorvastatin 20 MG tablet Commonly known as: LIPITOR Take 1 tablet (20 mg total) by mouth daily at 6 PM. For cholesterol   blood glucose meter kit and supplies Kit Dispense based on patient and insurance preference. Use up to four times daily as directed. (FOR ICD-9 250.00, 250.01).   calcitRIOL 0.25 MCG capsule Commonly known as: ROCALTROL Take 0.25 mcg by mouth daily.   cetirizine 10 MG tablet Commonly known as: ZYRTEC Take 10 mg by mouth daily.   cloNIDine 0.1 MG tablet Commonly known as: CATAPRES TAKE 1 TABLET(0.1 MG) BY MOUTH THREE TIMES DAILY What changed: See the new instructions.  glucose blood test strip Use as directed to check blood sugars 3 times per day dx: e11.65   insulin detemir 100 UNIT/ML FlexPen Commonly known as: LEVEMIR Inject 5 Units into the skin at bedtime. May need to increase dose for high blood sugar, contact your doctor for high blood sugar. What changed:   how much to take  how to take this  when to take this  additional instructions   metoprolol tartrate 25 MG tablet Commonly known as: LOPRESSOR Take 0.5 tablets (12.5 mg total) by mouth 2 (two) times daily.   zinc sulfate 220 (50 Zn) MG capsule Take 1 capsule (220 mg total) by mouth daily. Start taking on: August 15, 2020            Durable Medical Equipment  (From admission, onward)         Start     Ordered   08/13/20 1636  For home use only DME lightweight manual wheelchair with seat cushion  Once       Comments: Patient  suffers from generalized weakness due to [place]  which impairs their ability to perform daily activities lADL's in the home.  A walking aid will not resolve  issue with performing activities of daily living. A wheelchair will allow patient to safely perform daily activities. Patient is not able to propel themselves in the home using a standard weight wheelchair due to weakness. Patient can self propel in the lightweight wheelchair. Length of  need lifetime Accessories: elevating leg rests (ELRs), wheel locks, extensions and anti-tippers.   08/13/20 1637   08/13/20 1635  For home use only DME 3 n 1  Once        08/13/20 1637   08/03/20 1327  For home use only DME 3 n 1  Once        08/03/20 1326         Allergies  Allergen Reactions  . Penicillins Swelling and Rash    The results of significant diagnostics from this hospitalization (including imaging, microbiology, ancillary and laboratory) are listed below for reference.    Significant Diagnostic Studies: DG Chest 2 View  Result Date: 07/25/2020 CLINICAL DATA:  COVID-19 EXAM: CHEST - 2 VIEW COMPARISON:  05/25/2015 FINDINGS: Multifocal peripheral predominant airspace opacity. No pleural effusion or pneumothorax. IMPRESSION: Multifocal peripheral predominant airspace opacity, compatible with multifocal pneumonia. Electronically Signed   By: Ulyses Jarred M.D.   On: 07/25/2020 02:48   US RENAL  Result Date: 07/25/2020 CLINICAL DATA:  Acute renal failure EXAM: RENAL / URINARY TRACT ULTRASOUND COMPLETE COMPARISON:  None. FINDINGS: Right Kidney: Renal measurements: 9.8 x 4.8 x 5.1 cm = volume: 126 mL. Diffusely increased renal cortical echogenicity. Mild renal pelviectasis. No calyceal dilatation to suggest frank hydronephrosis. No concerning renal mass or shadowing calculus is visible Left Kidney: Renal measurements: 10.4 x 5.5 x 5.4 cm = volume: 161 mL. Diffusely increased renal cortical echogenicity. Tiny echogenic, shadowing focus in the  interpolar left kidney measuring up to 6 mm in size, could reflect a tiny nonobstructive calculus or Randall's plaque. No hydronephrosis or concerning mass. Bladder: Within normal limits for the degree of distention. Other: None. IMPRESSION: 1. Diffusely increased renal cortical echogenicity bilaterally compatible with medical renal disease. 2. Mild right renal pelviectasis without calyceal dilatation to suggest frank hydronephrosis. Could reflect physiologic distension or an extrarenal pelvis. 3. 6 mm echogenic, shadowing focus in the interpolar left kidney may reflect a tiny nonobstructive calculus or Randall's plaque. Electronically Signed   By:  Lovena Le M.D.   On: 07/25/2020 20:23   IR Fluoro Guide CV Line Right  Result Date: 08/12/2020 INDICATION: 85 year old female with end-stage renal disease currently dialyzing through a right IJ temporary hemodialysis catheter. She presents for conversion to a tunneled dialysis catheter. EXAM: IR RIGHT FLUORO GUIDE CV LINE MEDICATIONS: None. ANESTHESIA/SEDATION: Moderate (conscious) sedation was employed during this procedure. A total of Versed 0.5 mg and Fentanyl 25 mcg was administered intravenously. Moderate Sedation Time: 12 minutes. The patient's level of consciousness and vital signs were monitored continuously by radiology nursing throughout the procedure under my direct supervision. FLUOROSCOPY TIME:  Fluoroscopy Time: 0 minutes 12 seconds (1 mGy). COMPLICATIONS: None immediate. PROCEDURE: Informed written consent was obtained from the patient after a thorough discussion of the procedural risks, benefits and alternatives. All questions were addressed. Maximal Sterile Barrier Technique was utilized including caps, mask, sterile gowns, sterile gloves, sterile drape, hand hygiene and skin antiseptic. A timeout was performed prior to the initiation of the procedure. A wire was advanced through the existing catheter, through the right atrium and into the inferior  vena cava. The existing catheter was removed. Peel-away sheath was advanced over the wire and into the right heart. A suitable skin exit site was selected inferior to the clavicle. Local anesthesia was attained by infiltration with 1% lidocaine. A small dermatotomy was made. A 19 cm tip to cuff palindrome hemodialysis catheter was then tunneled from the skin exit site to the dermatotomy overlying the vascular access site. The catheter was then advanced through the peel-away sheath. The peel-away sheath was removed and discarded. The catheter tip is positioned in the mid right atrium. An image was obtained and stored for the medical record. The catheter was flushed and then locked with heparinized saline before being secured to the skin with 0 Prolene suture. Sterile bandages were applied. The catheter was capped. IMPRESSION: Successful exchange to a 19 cm Palindrome tunneled hemodialysis catheter. The catheter tip is in the mid right atrium and ready for immediate use. Electronically Signed   By: Jacqulynn Cadet M.D.   On: 08/12/2020 10:51   IR Fluoro Guide CV Line Right  Result Date: 07/29/2020 INDICATION: History of COVID now with acute renal insufficiency. Please perform placement of a non tunneled hemodialysis catheter for the initiation of dialysis. EXAM: NON-TUNNELED CENTRAL VENOUS HEMODIALYSIS CATHETER PLACEMENT WITH ULTRASOUND AND FLUOROSCOPIC GUIDANCE COMPARISON:  None. MEDICATIONS: None, the patient is currently admitted to the hospital receiving intravenous antibiotics; The antibiotic was administered within an appropriate time interval prior to skin puncture. FLUOROSCOPY TIME:  30 seconds COMPLICATIONS: None immediate. PROCEDURE: Informed written consent was obtained from the patient after a discussion of the risks, benefits, and alternatives to treatment. Questions regarding the procedure were encouraged and answered. The right neck and chest were prepped with chlorhexidine in a sterile fashion,  and a sterile drape was applied covering the operative field. Maximum barrier sterile technique with sterile gowns and gloves were used for the procedure. A timeout was performed prior to the initiation of the procedure. After the overlying soft tissues were anesthetized, a small venotomy incision was created and a micropuncture kit was utilized to access the internal jugular vein. Real-time ultrasound guidance was utilized for vascular access including the acquisition of a permanent ultrasound image documenting patency of the accessed vessel. The microwire was utilized to measure appropriate catheter length. A stiff glidewire was advanced to the level of the IVC. Under fluoroscopic guidance, the venotomy was serially dilated, ultimately allowing placement  of a 16 cm temporary Mahurkar catheter with tip ultimately terminating within the superior aspect of the right atrium. Final catheter positioning was confirmed and documented with a spot radiographic image. The catheter aspirates and flushes normally. The catheter was flushed with appropriate volume heparin dwells. The catheter exit site was secured with a 0-Prolene retention suture. A dressing was applied. The patient tolerated the procedure well without immediate post procedural complication. IMPRESSION: Successful placement of a right internal jugular approach 16 cm temporary dialysis catheter with tip terminating with in the superior aspect of the right atrium. The catheter is ready for immediate use. PLAN: This catheter may be converted to a tunneled dialysis catheter at a later date as indicated. Electronically Signed   By: Sandi Mariscal M.D.   On: 07/29/2020 14:57   IR US Guide Vasc Access Right  Result Date: 07/29/2020 INDICATION: History of COVID now with acute renal insufficiency. Please perform placement of a non tunneled hemodialysis catheter for the initiation of dialysis. EXAM: NON-TUNNELED CENTRAL VENOUS HEMODIALYSIS CATHETER PLACEMENT WITH  ULTRASOUND AND FLUOROSCOPIC GUIDANCE COMPARISON:  None. MEDICATIONS: None, the patient is currently admitted to the hospital receiving intravenous antibiotics; The antibiotic was administered within an appropriate time interval prior to skin puncture. FLUOROSCOPY TIME:  30 seconds COMPLICATIONS: None immediate. PROCEDURE: Informed written consent was obtained from the patient after a discussion of the risks, benefits, and alternatives to treatment. Questions regarding the procedure were encouraged and answered. The right neck and chest were prepped with chlorhexidine in a sterile fashion, and a sterile drape was applied covering the operative field. Maximum barrier sterile technique with sterile gowns and gloves were used for the procedure. A timeout was performed prior to the initiation of the procedure. After the overlying soft tissues were anesthetized, a small venotomy incision was created and a micropuncture kit was utilized to access the internal jugular vein. Real-time ultrasound guidance was utilized for vascular access including the acquisition of a permanent ultrasound image documenting patency of the accessed vessel. The microwire was utilized to measure appropriate catheter length. A stiff glidewire was advanced to the level of the IVC. Under fluoroscopic guidance, the venotomy was serially dilated, ultimately allowing placement of a 16 cm temporary Mahurkar catheter with tip ultimately terminating within the superior aspect of the right atrium. Final catheter positioning was confirmed and documented with a spot radiographic image. The catheter aspirates and flushes normally. The catheter was flushed with appropriate volume heparin dwells. The catheter exit site was secured with a 0-Prolene retention suture. A dressing was applied. The patient tolerated the procedure well without immediate post procedural complication. IMPRESSION: Successful placement of a right internal jugular approach 16 cm temporary  dialysis catheter with tip terminating with in the superior aspect of the right atrium. The catheter is ready for immediate use. PLAN: This catheter may be converted to a tunneled dialysis catheter at a later date as indicated. Electronically Signed   By: Sandi Mariscal M.D.   On: 07/29/2020 14:57   DG CHEST PORT 1 VIEW  Result Date: 08/01/2020 CLINICAL DATA:  Pneumonia EXAM: PORTABLE CHEST 1 VIEW COMPARISON:  07/25/2020 FINDINGS: Stable low volume chest with patchy bilateral airspace disease. Cardiomegaly accentuated by rotation. Dialysis catheter with tip at the upper right atrium. No visible effusion or air leak. IMPRESSION: Stable low volume chest with bilateral pneumonia. Electronically Signed   By: Monte Fantasia M.D.   On: 08/01/2020 07:49   ECHOCARDIOGRAM LIMITED  Result Date: 07/30/2020    ECHOCARDIOGRAM LIMITED REPORT  Patient Name:   [name] [name] Date of Exam: 07/30/2020 Medical Rec #:  [id number]              Height:       61.0 in Accession #:    [id number]             Weight:       151.0 lb Date of Birth:  Nov 12, 1935              BSA:          1.676 m Patient Age:    38 years               BP:           160/98 mmHg Patient Gender: F                      HR:           98 bpm. Exam Location:  Inpatient Procedure: Limited Echo, Cardiac Doppler and Color Doppler Indications:    Atrial fibrillation  History:        Patient has prior history of Echocardiogram examinations, most                 recent 05/26/2015. Arrythmias:Atrial Fibrillation,                 Signs/Symptoms:Altered Mental Status and Shortness of Breath;                 Risk Factors:Diabetes, Hypertension and Dyslipidemia. COVID+,                 sepsis.  Sonographer:    Dustin Flock Referring Phys: [id number] [place]  1. Left ventricular ejection fraction, by estimation, is 55 to 60%. The left ventricle has normal function. The left ventricle has no regional wall motion abnormalities. There is mild  concentric left ventricular hypertrophy. Left ventricular diastolic parameters are consistent with Grade I diastolic dysfunction (impaired relaxation). Elevated left ventricular end-diastolic pressure.  2. Right ventricular systolic function is normal. The right ventricular size is normal. There is mildly elevated pulmonary artery systolic pressure.  3. The mitral valve is normal in structure. Trivial mitral valve regurgitation. No evidence of mitral stenosis.  4. The aortic valve is normal in structure. Aortic valve regurgitation is not visualized. No aortic stenosis is present.  5. The inferior vena cava is normal in size with greater than 50% respiratory variability, suggesting right atrial pressure of 3 mmHg. FINDINGS  Left Ventricle: Left ventricular ejection fraction, by estimation, is 55 to 60%. The left ventricle has normal function. The left ventricle has no regional wall motion abnormalities. The left ventricular internal cavity size was normal in size. There is  mild concentric left ventricular hypertrophy. Left ventricular diastolic parameters are consistent with Grade I diastolic dysfunction (impaired relaxation). Elevated left ventricular end-diastolic pressure. Right Ventricle: The right ventricular size is normal. No increase in right ventricular wall thickness. Right ventricular systolic function is normal. There is mildly elevated pulmonary artery systolic pressure. The tricuspid regurgitant velocity is 3.23  m/s, and with an assumed right atrial pressure of 3 mmHg, the estimated right ventricular systolic pressure is 00.4 mmHg. Left Atrium: Left atrial size was normal in size. Right Atrium: Right atrial size was normal in size. Pericardium: There is no evidence of pericardial effusion. Mitral Valve: The mitral valve is normal in structure. Trivial mitral valve regurgitation. No evidence of mitral valve stenosis. Tricuspid  Valve: The tricuspid valve is normal in structure. Tricuspid valve  regurgitation is trivial. No evidence of tricuspid stenosis. Aortic Valve: The aortic valve is normal in structure. Aortic valve regurgitation is not visualized. No aortic stenosis is present. Aortic valve peak gradient measures 9.2 mmHg. Pulmonic Valve: The pulmonic valve was normal in structure. Pulmonic valve regurgitation is not visualized. No evidence of pulmonic stenosis. Aorta: The aortic root is normal in size and structure. Venous: The inferior vena cava is normal in size with greater than 50% respiratory variability, suggesting right atrial pressure of 3 mmHg. IAS/Shunts: No atrial level shunt detected by color flow Doppler. LEFT VENTRICLE PLAX 2D LVIDd:         3.50 cm  Diastology LVIDs:         2.50 cm  LV e' medial:    3.26 cm/s LV PW:         1.30 cm  LV E/e' medial:  23.2 LV IVS:        1.20 cm  LV e' lateral:   6.09 cm/s LVOT diam:     2.00 cm  LV E/e' lateral: 12.4 LVOT Area:     3.14 cm  RIGHT VENTRICLE RV S prime:     5.22 cm/s LEFT ATRIUM         Index LA diam:    3.00 cm 1.79 cm/m  AORTIC VALVE AV Vmax:      152.00 cm/s AV Peak Grad: 9.2 mmHg  AORTA Ao Root diam: 2.80 cm MITRAL VALVE                TRICUSPID VALVE MV Area (PHT): 7.44 cm     TR Peak grad:   41.7 mmHg MV Decel Time: 102 msec     TR Vmax:        323.00 cm/s MV E velocity: 75.50 cm/s MV A velocity: 114.00 cm/s  SHUNTS MV E/A ratio:  0.66         Systemic Diam: 2.00 cm Skeet Latch MD Electronically signed by Skeet Latch MD Signature Date/Time: 07/30/2020/4:30:42 PM    Final    Labs: Basic Metabolic Panel: Recent Labs  Lab 08/10/20 0408 08/12/20 0919 08/14/20 1007  NA 135 134* 134*  K 3.5 4.1 4.1  CL 97* 97* 96*  CO2 '24 26 29  ' GLUCOSE 36* 206* 173*  BUN 62* 45* 29*  CREATININE 6.08* 5.61* 5.37*  CALCIUM 7.9* 7.3* 7.5*  PHOS 5.9* 4.0 3.7   Liver Function Tests: Recent Labs  Lab 08/10/20 0408 08/12/20 0919 08/14/20 1007  ALBUMIN 2.2* 2.0* 2.2*   CBC: Recent Labs  Lab 08/08/20 0316  08/10/20 0408 08/12/20 0919 08/13/20 0133 08/14/20 0209  WBC 14.5* 10.2 9.0 8.8 8.2  NEUTROABS 12.8*  --   --   --   --   HGB 10.3* 10.1* 9.0* 9.1* 8.3*  HCT 30.3* 30.2* 28.1* 27.2* 26.4*  MCV 73.2* 74.9* 77.6* 75.8* 78.6*  PLT 202 118* 82* 80* 85*    Recent Labs    07/25/20 0619 07/31/20 1821  BNP 259.8* 113.0*   CBG: Recent Labs  Lab 08/13/20 1235 08/13/20 1722 08/13/20 2005 08/14/20 0747 08/14/20 1207  GLUCAP 123* 196* 156* 78 228*    Principal Problem:   Acute hypoxemic respiratory failure due to COVID-19 [facility]) Active Problems:   Essential hypertension   HLD (hyperlipidemia)   DM (diabetes mellitus), type 2 with renal complications (HCC)   Chronic diastolic HF (heart failure) (HCC)   CAP (community acquired pneumonia)  CKD (chronic kidney disease), stage IV (HCC)   AKI (acute kidney injury) ([place])   Sepsis ([place])   Atrial fibrillation with RVR ([place])   Delirium   Thrombocytopenia ([place])   ESRD (end stage renal disease) ([place])   Time coordinating discharge: 35 minutes  Signed:  Murray Hodgkins, MD  [facility]  08/14/2020, 2:06 PM

## 2020-08-17 ENCOUNTER — Telehealth: Payer: Self-pay

## 2020-08-17 ENCOUNTER — Other Ambulatory Visit: Payer: Self-pay

## 2020-08-17 DIAGNOSIS — Z992 Dependence on renal dialysis: Secondary | ICD-10-CM | POA: Diagnosis not present

## 2020-08-17 DIAGNOSIS — N186 End stage renal disease: Secondary | ICD-10-CM | POA: Diagnosis not present

## 2020-08-17 DIAGNOSIS — D509 Iron deficiency anemia, unspecified: Secondary | ICD-10-CM | POA: Diagnosis not present

## 2020-08-17 DIAGNOSIS — T8249XS Other complication of vascular dialysis catheter, sequela: Secondary | ICD-10-CM | POA: Diagnosis not present

## 2020-08-17 NOTE — Telephone Encounter (Signed)
  Transition Care Management Follow-up Telephone Call  Date of discharge and from where: 08/15/2019 [facility]   How have you been since you were released from the hospital? Patient is getting stronger and better. Any questions or concerns? Daughter [name] said the pt is acting confused.  Items Reviewed:  Did the pt receive and understand the discharge instructions provided? Yes  Medications obtained and verified? yesz  Any new allergies since your discharge? No  Dietary orders reviewed? yes low sodium  Do you have support at home?yes Other (ie: DME, Home Health, etc) n/a Functional Questionnaire: (I = Independent and D = Dependent)  Bathing/Dressing- D   Meal Prep- D  Eating- I  Maintaining continence- I  Transferring/Ambulation- I  Managing Meds- D   Follow up appointments reviewed:    PCP Hospital f/u appt confirmed? On 08/20/2020 with Blima Singer BNP, FNP-BC  [facility] f/u appt confirmed?n/a  Are transportation arrangements needed? no  If their condition worsens, is the pt aware to call  their PCP or go to the ED? yes  Was the patient provided with contact information for the PCP's office or ED? yes  Was the pt encouraged to call back with questions or concerns? yes

## 2020-08-19 DIAGNOSIS — D509 Iron deficiency anemia, unspecified: Secondary | ICD-10-CM | POA: Diagnosis not present

## 2020-08-19 DIAGNOSIS — Z992 Dependence on renal dialysis: Secondary | ICD-10-CM | POA: Diagnosis not present

## 2020-08-19 DIAGNOSIS — N186 End stage renal disease: Secondary | ICD-10-CM | POA: Diagnosis not present

## 2020-08-19 DIAGNOSIS — T8249XS Other complication of vascular dialysis catheter, sequela: Secondary | ICD-10-CM | POA: Diagnosis not present

## 2020-08-20 ENCOUNTER — Other Ambulatory Visit: Payer: Self-pay | Admitting: Internal Medicine

## 2020-08-20 ENCOUNTER — Ambulatory Visit (INDEPENDENT_AMBULATORY_CARE_PROVIDER_SITE_OTHER): Payer: Medicare HMO | Admitting: Nurse Practitioner

## 2020-08-20 ENCOUNTER — Other Ambulatory Visit: Payer: Self-pay

## 2020-08-20 ENCOUNTER — Encounter: Payer: Self-pay | Admitting: Nurse Practitioner

## 2020-08-20 VITALS — BP 128/60 | HR 60 | Temp 98.6°F | Ht 59.2 in | Wt 139.4 lb

## 2020-08-20 DIAGNOSIS — Z2821 Immunization not carried out because of patient refusal: Secondary | ICD-10-CM | POA: Diagnosis not present

## 2020-08-20 DIAGNOSIS — Z6827 Body mass index (BMI) 27.0-27.9, adult: Secondary | ICD-10-CM

## 2020-08-20 DIAGNOSIS — N185 Chronic kidney disease, stage 5: Secondary | ICD-10-CM

## 2020-08-20 DIAGNOSIS — I130 Hypertensive heart and chronic kidney disease with heart failure and stage 1 through stage 4 chronic kidney disease, or unspecified chronic kidney disease: Secondary | ICD-10-CM

## 2020-08-20 DIAGNOSIS — E663 Overweight: Secondary | ICD-10-CM

## 2020-08-20 DIAGNOSIS — N184 Chronic kidney disease, stage 4 (severe): Secondary | ICD-10-CM

## 2020-08-20 DIAGNOSIS — Z794 Long term (current) use of insulin: Secondary | ICD-10-CM | POA: Diagnosis not present

## 2020-08-20 DIAGNOSIS — E1122 Type 2 diabetes mellitus with diabetic chronic kidney disease: Secondary | ICD-10-CM | POA: Diagnosis not present

## 2020-08-20 DIAGNOSIS — E785 Hyperlipidemia, unspecified: Secondary | ICD-10-CM

## 2020-08-20 DIAGNOSIS — I10 Essential (primary) hypertension: Secondary | ICD-10-CM

## 2020-08-20 DIAGNOSIS — Z992 Dependence on renal dialysis: Secondary | ICD-10-CM | POA: Diagnosis not present

## 2020-08-20 NOTE — Patient Instructions (Signed)
Chronic Kidney Disease, Adult Chronic kidney disease (CKD) occurs when the kidneys are slowly and permanently damaged over a [place] period of time. The kidneys are a pair of organs that do many important jobs in the body, including:  Removing waste and extra fluid from the blood to make urine.  Making hormones that maintain the amount of fluid in tissues and blood vessels.  Maintaining the right amount of fluids and chemicals in the body. A small amount of kidney damage may not cause problems, but a large amount of damage may make it hard or impossible for the kidneys to work right. Steps must be taken to slow kidney damage or to stop it from getting worse. If steps are not taken, the kidneys may stop working permanently (end-stage renal disease, or ESRD). Most of the time, CKD does not go away, but it can often be controlled. People who have CKD are usually able to live full lives. What are the causes? The most common causes of this condition are diabetes and high blood pressure (hypertension). Other causes include:  Cardiovascular diseases. These affect the heart and blood vessels.  Kidney diseases. These include: ? Glomerulonephritis, or inflammation of the tiny filters in the kidneys. ? Interstitial nephritis. This is swelling of the small tubes of the kidneys and of the surrounding structures. ? Polycystic kidney disease, in which clusters of fluid-filled sacs form within the kidneys. ? Renal vascular disease. This includes disorders that affect the arteries and veins of the kidneys.  Diseases that affect the body's defense system (immune system).  A problem with urine flow. This may be caused by: ? Kidney stones. ? Cancer. ? An enlarged prostate, in males.  A kidney infection or urinary tract infection (UTI) that keeps coming back.  Vasculitis. This is swelling or inflammation of the blood vessels. What increases the risk? Your chances of having kidney disease increase with  age. The following factors may make you more likely to develop this condition:  A family history of kidney disease or kidney failure. Kidney failure means the kidneys can no longer work right.  Certain genetic diseases.  Taking medicines often that are damaging to the kidneys.  Being around or being in contact with toxic substances.  Obesity.  A history of tobacco use. What are the signs or symptoms? Symptoms of this condition include:  Feeling very tired (lethargic) and having less energy.  Swelling, or edema, of the face, legs, ankles, or feet.  Nausea or vomiting, or loss of appetite.  Confusion or trouble concentrating.  Muscle twitches and cramps, especially in the legs.  Dry, itchy skin.  A metallic taste in the mouth.  Producing less urine, or producing more urine (especially at night).  Shortness of breath.  Trouble sleeping. CKD may also result in not having enough red blood cells or hemoglobin in the blood (anemia) or having weak bones (bone disease). Symptoms develop slowly and may not be obvious until the kidney damage becomes severe. It is possible to have kidney disease for years without having symptoms. How is this diagnosed? This condition may be diagnosed based on:  Blood tests.  Urine tests.  Imaging tests, such as an ultrasound or a CT scan.  A kidney biopsy. This involves removing a sample of kidney tissue to be looked at under a microscope. Results from these tests will help to determine how serious the CKD is. How is this treated? There is no cure for most cases of this condition, but treatment usually relieves   symptoms and prevents or slows the worsening of the disease. Treatment may include:  Diet changes, which may require you to avoid alcohol and foods that are high in salt, potassium, phosphorous, and protein.  Medicines. These may: ? Lower blood pressure. ? Control blood sugar (glucose). ? Relieve anemia. ? Relieve  swelling. ? Protect your bones. ? Improve the balance of salts and minerals in your blood (electrolytes).  Dialysis, which is a type of treatment that removes toxic waste from the body. It may be needed if you have kidney failure.  Managing any other conditions that are causing your CKD or making it worse. Follow these instructions at home: Medicines  Take over-the-counter and prescription medicines only as told by your health care provider. The amount of some medicines that you take may need to be changed.  Do not take any new medicines unless approved by your health care provider. Many medicines can make kidney damage worse.  Do not take any vitamin and mineral supplements unless approved by your health care provider. Many nutritional supplements can make kidney damage worse. Lifestyle  Do not use any products that contain nicotine or tobacco, such as cigarettes, e-cigarettes, and chewing tobacco. If you need help quitting, ask your health care provider.  If you drink alcohol: ? Limit how much you use to:  0-1 drink a day for women who are not pregnant.  0-2 drinks a day for men. ? Know how much alcohol is in your drink. In the [place] one drink equals one 12 oz bottle of beer (355 mL), one 5 oz glass of wine (148 mL), or one 1 oz glass of hard liquor (44 mL).  Maintain a healthy weight. If you need help, ask your health care provider.   General instructions  Follow instructions from your health care provider about eating or drinking restrictions, including any prescribed diet.  Track your blood pressure at home. Report changes in your blood pressure as told.  If you are being treated for diabetes, track your blood glucose levels as told.  Start or continue an exercise plan. Exercise at least 30 minutes a day, 5 days a week.  Keep your immunizations up to date as told.  Keep all follow-up visits. This is important.   Where to find more information  American Association of  Kidney Patients: www.aakp.org  National Kidney Foundation: www.kidney.org  American Kidney Fund: www.akfinc.org  Life Options: www.lifeoptions.org  Kidney School: www.kidneyschool.org Contact a health care provider if:  Your symptoms get worse.  You develop new symptoms. Get help right away if:  You develop symptoms of ESRD. These include: ? Headaches. ? Numbness in your hands or feet. ? Easy bruising. ? Frequent hiccups. ? Chest pain. ? Shortness of breath. ? Lack of menstrual periods, in women.  You have a fever.  You are producing less urine than usual.  You have pain or bleeding when you urinate or when you have a bowel movement. These symptoms may represent a serious problem that is an emergency. Do not wait to see if the symptoms will go away. Get medical help right away. Call your local emergency services (911 in the [place] Do not drive yourself to the hospital. Summary  Chronic kidney disease (CKD) occurs when the kidneys become damaged slowly over a [place] period of time.  The most common causes of this condition are diabetes and high blood pressure (hypertension).  There is no cure for most cases of CKD, but treatment usually relieves symptoms and prevents   or slows the worsening of the disease. Treatment may include a combination of lifestyle changes, medicines, and dialysis. This information is not intended to replace advice given to you by your health care provider. Make sure you discuss any questions you have with your health care provider. Document Revised: 10/30/2019 Document Reviewed: 10/30/2019 Elsevier Patient Education  2021 Elsevier Inc.  

## 2020-08-20 NOTE — Progress Notes (Addendum)
I,Tianna Badgett,acting as a Education administrator for [employer], NP.,have documented all relevant documentation on the behalf of [employer], NP,as directed by  Bary Castilla, NP while in the presence of Bary Castilla, NP.  This visit occurred during the SARS-CoV-2 public health emergency.  Safety protocols were in place, including screening questions prior to the visit, additional usage of staff PPE, and extensive cleaning of exam room while observing appropriate contact time as indicated for disinfecting solutions.  Subjective:     Patient ID: [name] [name] , female    DOB: 1936-07-09 , 85 y.o.   MRN: [id number]   Chief Complaint  Patient presents with  . Hospitalization Follow-up    HPI  Patient is here for hospital follow up. She is in dialysis after Covid. Took her to [facility] and then transferred to [facility]. She gets dialysis 3 times a week. She is also followed by a kidney specialist and the patient will follow up with them as well.  Her Blood sugar is running 170-200.  Encouraged her to eat healthy. She does not eat much meat. She has a hard time chewing. Cabbage, mashed potatoes, chicken noodle soup. She tries to do leg exercise at home.     Past Medical History:  Diagnosis Date  . Arthritis    RA  . CHF (congestive heart failure) ([place])   . CKD (chronic kidney disease) stage 3, GFR 30-59 ml/min (HCC)   . Diabetes mellitus without complication (HCC)    insulin dependent  . Hyperlipidemia   . Hypertension   . Shortness of breath dyspnea   . Sleep apnea    cpap  . TIA (transient ischemic attack) 2015     Family History  Problem Relation Age of Onset  . Diabetes type II Mother   . Hypertension Mother   . Diabetes type II Father   . Hypertension Other   . Hyperlipidemia Other   . Stroke Other      Current Outpatient Medications:  .  apixaban (ELIQUIS) 2.5 MG TABS tablet, Take 1 tablet (2.5 mg total) by mouth 2 (two) times daily., Disp: 60  tablet, Rfl: 2 .  ascorbic acid (VITAMIN C) 500 MG tablet, Take 1 tablet (500 mg total) by mouth daily., Disp: , Rfl:  .  atorvastatin (LIPITOR) 20 MG tablet, TAKE 1 TABLET(20 MG) BY MOUTH DAILY AT 6 PM FOR CHOLESTEROL, Disp: 90 tablet, Rfl: 1 .  blood glucose meter kit and supplies KIT, Dispense based on patient and insurance preference. Use up to four times daily as directed. (FOR ICD-9 250.00, 250.01)., Disp: 1 each, Rfl: 0 .  calcitRIOL (ROCALTROL) 0.25 MCG capsule, Take 0.25 mcg by mouth daily., Disp: , Rfl:  .  cetirizine (ZYRTEC) 10 MG tablet, Take 10 mg by mouth daily., Disp: , Rfl:  .  cloNIDine (CATAPRES) 0.1 MG tablet, TAKE 1 TABLET(0.1 MG) BY MOUTH THREE TIMES DAILY (Patient taking differently: Take 0.1 mg by mouth 3 (three) times daily.), Disp: 270 tablet, Rfl: 1 .  glucose blood test strip, Use as directed to check blood sugars 3 times per day dx: e11.65, Disp: 100 each, Rfl: 12 .  insulin detemir (LEVEMIR) 100 UNIT/ML FlexPen, Inject 5 Units into the skin at bedtime. May need to increase dose for high blood sugar, contact your doctor for high blood sugar., Disp: , Rfl:  .  metoprolol tartrate (LOPRESSOR) 25 MG tablet, Take 0.5 tablets (12.5 mg total) by mouth 2 (two) times daily., Disp: 30 tablet, Rfl: 2 .  zinc sulfate 220 (50 Zn) MG capsule, Take 1 capsule (220 mg total) by mouth daily., Disp: , Rfl:    Allergies  Allergen Reactions  . Penicillins Swelling and Rash     Review of Systems  Constitutional: Negative.  Negative for chills, fatigue and fever.  HENT: Negative for congestion.   Respiratory: Negative.  Negative for cough and shortness of breath.   Cardiovascular: Negative.  Negative for chest pain and leg swelling.  Gastrointestinal: Negative.  Negative for constipation, diarrhea and nausea.  Endocrine: Negative for polydipsia, polyphagia and polyuria.  Musculoskeletal: Negative for gait problem.  Neurological: Negative.   Psychiatric/Behavioral:       Confused at  times      Today's Vitals   08/20/20 1106  BP: 128/60  Pulse: 60  Temp: 98.6 F (37 C)  TempSrc: Oral  Weight: 139 lb 6.4 oz (63.2 kg)  Height: 4' 11.2" (1.504 m)   Body mass index is 27.97 kg/m.  Wt Readings from Last 3 Encounters:  08/20/20 139 lb 6.4 oz (63.2 kg)  08/14/20 142 lb 3.2 oz (64.5 kg)  05/05/20 152 lb 12.8 oz (69.3 kg)    Objective:  Physical Exam Constitutional:      General: She is not in acute distress.    Appearance: Normal appearance. She is obese. She is not ill-appearing.  HENT:     Head: Normocephalic and atraumatic.  Cardiovascular:     Rate and Rhythm: Normal rate and regular rhythm.     Pulses: Normal pulses.     Heart sounds: Normal heart sounds.  Pulmonary:     Effort: Pulmonary effort is normal. No respiratory distress.     Breath sounds: Normal breath sounds. No wheezing.  Musculoskeletal:        General: No tenderness.  Skin:    General: Skin is warm and dry.     Capillary Refill: Capillary refill takes less than 2 seconds.  Neurological:     Mental Status: She is alert and oriented to person, place, and time.  Psychiatric:        Mood and Affect: Mood normal.        Behavior: Behavior normal.        Thought Content: Thought content normal.     Assessment And Plan:     1. Chronic renal disease (CKD), stage V ([place]) -Patient is currently on dialysis since she has been hospitalized for COVID. She goes 3 times a week.  -She is also followed by a kidney specialized. Encouraged her to follow up with them.   2. Dependence of renal dialysis ([place])  -Patient on dialysis x 3 week for CKD stage V due to COVID pneumonia   3. 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 Type 2 diabetes mellitus with stage 4 chronic kidney disease, with [place]-term current use of insulin (HCC) -Upon dc from hospital, patient currently taking levemir- 5 units at night.  -Educated to take 7 units  of levemir- at night and give [place] a call on Monday/Tuesday to let us know what her CBG will be running with 7 units at bedtime and will adjust if needed depending on the fasting CBG    5. Overweight with body mass index (BMI) of 27 to 27.9 in adult -Educated about a healthy lifestyle including diet and exercise. She is encouraged to eat a well balanced diet. And also do as much leg exercises at home as she can.  6. COVID-19 vaccination declined -Patient declined at this time    Staying healthy and adopting a healthy lifestyle for your overall health is important. You should eat 7 or more servings of fruits and vegetables per day. You should drink plenty of water to keep yourself hydrated and your kidneys healthy. This includes about 65-80+ fluid ounces of water. Limit your intake of animal fats especially for elevated cholesterol. Avoid highly processed food and limit your salt intake if you have hypertension. Avoid foods high in saturated/Trans fats. Along with a healthy diet it is also very important to maintain time for yourself to maintain a healthy mental health with low stress levels. You should get atleast 150 min of moderate intensity exercise weekly for a healthy heart. Along with eating right and exercising, aim for at least 7-9 hours of sleep daily.  Eat more whole grains which includes barley, wheat berries, oats, brown rice and whole wheat pasta. Use healthy plant oils which include olive, soy, corn, sunflower and peanut. Limit your caffeine and sugary drinks. Limit your intake of fast foods. Limit milk and dairy products to one or two daily servings.    She is encouraged to strive for BMI less than 26 to decrease cardiac risk. Advised to aim for at least 150 minutes of exercise per week.   Patient was given opportunity to ask questions. Patient verbalized understanding of the plan and was able to repeat key elements of the plan. All questions were answered to their satisfaction.   Bary Castilla, NP   I, Bary Castilla, NP, have reviewed all documentation for this visit. The documentation on 08/26/20 for the exam, diagnosis, procedures, and orders are all accurate and complete.  THE PATIENT IS ENCOURAGED TO PRACTICE SOCIAL DISTANCING DUE TO THE COVID-19 PANDEMIC.

## 2020-08-21 DIAGNOSIS — N186 End stage renal disease: Secondary | ICD-10-CM | POA: Diagnosis not present

## 2020-08-21 DIAGNOSIS — Z992 Dependence on renal dialysis: Secondary | ICD-10-CM | POA: Diagnosis not present

## 2020-08-21 DIAGNOSIS — T8249XS Other complication of vascular dialysis catheter, sequela: Secondary | ICD-10-CM | POA: Diagnosis not present

## 2020-08-21 DIAGNOSIS — D509 Iron deficiency anemia, unspecified: Secondary | ICD-10-CM | POA: Diagnosis not present

## 2020-08-24 DIAGNOSIS — Z992 Dependence on renal dialysis: Secondary | ICD-10-CM | POA: Diagnosis not present

## 2020-08-24 DIAGNOSIS — N186 End stage renal disease: Secondary | ICD-10-CM | POA: Diagnosis not present

## 2020-08-24 DIAGNOSIS — D509 Iron deficiency anemia, unspecified: Secondary | ICD-10-CM | POA: Diagnosis not present

## 2020-08-24 DIAGNOSIS — T8249XS Other complication of vascular dialysis catheter, sequela: Secondary | ICD-10-CM | POA: Diagnosis not present

## 2020-08-26 DIAGNOSIS — Z992 Dependence on renal dialysis: Secondary | ICD-10-CM | POA: Diagnosis not present

## 2020-08-26 DIAGNOSIS — D509 Iron deficiency anemia, unspecified: Secondary | ICD-10-CM | POA: Diagnosis not present

## 2020-08-26 DIAGNOSIS — N186 End stage renal disease: Secondary | ICD-10-CM | POA: Diagnosis not present

## 2020-08-26 DIAGNOSIS — T8249XS Other complication of vascular dialysis catheter, sequela: Secondary | ICD-10-CM | POA: Diagnosis not present

## 2020-08-27 NOTE — Progress Notes (Signed)
08/27/20: CSW received call from patient's daughter requesting assistance with [employer] application now that patient is through her COVID window. CSW completed application and emailed to [employer]. Daughter to follow up.  Letisia Schwalb LCSW

## 2020-08-28 DIAGNOSIS — D509 Iron deficiency anemia, unspecified: Secondary | ICD-10-CM | POA: Diagnosis not present

## 2020-08-28 DIAGNOSIS — T8249XS Other complication of vascular dialysis catheter, sequela: Secondary | ICD-10-CM | POA: Diagnosis not present

## 2020-08-28 DIAGNOSIS — Z992 Dependence on renal dialysis: Secondary | ICD-10-CM | POA: Diagnosis not present

## 2020-08-28 DIAGNOSIS — N186 End stage renal disease: Secondary | ICD-10-CM | POA: Diagnosis not present

## 2020-08-31 DIAGNOSIS — T8249XS Other complication of vascular dialysis catheter, sequela: Secondary | ICD-10-CM | POA: Diagnosis not present

## 2020-08-31 DIAGNOSIS — D509 Iron deficiency anemia, unspecified: Secondary | ICD-10-CM | POA: Diagnosis not present

## 2020-08-31 DIAGNOSIS — N186 End stage renal disease: Secondary | ICD-10-CM | POA: Diagnosis not present

## 2020-08-31 DIAGNOSIS — Z992 Dependence on renal dialysis: Secondary | ICD-10-CM | POA: Diagnosis not present

## 2020-09-02 DIAGNOSIS — Z992 Dependence on renal dialysis: Secondary | ICD-10-CM | POA: Diagnosis not present

## 2020-09-02 DIAGNOSIS — N186 End stage renal disease: Secondary | ICD-10-CM | POA: Diagnosis not present

## 2020-09-02 DIAGNOSIS — T8249XS Other complication of vascular dialysis catheter, sequela: Secondary | ICD-10-CM | POA: Diagnosis not present

## 2020-09-02 DIAGNOSIS — D509 Iron deficiency anemia, unspecified: Secondary | ICD-10-CM | POA: Diagnosis not present

## 2020-09-03 ENCOUNTER — Ambulatory Visit: Payer: Self-pay | Admitting: Internal Medicine

## 2020-09-04 DIAGNOSIS — N186 End stage renal disease: Secondary | ICD-10-CM | POA: Diagnosis not present

## 2020-09-04 DIAGNOSIS — Z992 Dependence on renal dialysis: Secondary | ICD-10-CM | POA: Diagnosis not present

## 2020-09-04 DIAGNOSIS — D509 Iron deficiency anemia, unspecified: Secondary | ICD-10-CM | POA: Diagnosis not present

## 2020-09-04 DIAGNOSIS — T8249XS Other complication of vascular dialysis catheter, sequela: Secondary | ICD-10-CM | POA: Diagnosis not present

## 2020-09-07 DIAGNOSIS — Z992 Dependence on renal dialysis: Secondary | ICD-10-CM | POA: Diagnosis not present

## 2020-09-07 DIAGNOSIS — T8249XS Other complication of vascular dialysis catheter, sequela: Secondary | ICD-10-CM | POA: Diagnosis not present

## 2020-09-07 DIAGNOSIS — D509 Iron deficiency anemia, unspecified: Secondary | ICD-10-CM | POA: Diagnosis not present

## 2020-09-07 DIAGNOSIS — N186 End stage renal disease: Secondary | ICD-10-CM | POA: Diagnosis not present

## 2020-09-07 DIAGNOSIS — E1122 Type 2 diabetes mellitus with diabetic chronic kidney disease: Secondary | ICD-10-CM | POA: Diagnosis not present

## 2020-09-09 DIAGNOSIS — N186 End stage renal disease: Secondary | ICD-10-CM | POA: Diagnosis not present

## 2020-09-09 DIAGNOSIS — N2581 Secondary hyperparathyroidism of renal origin: Secondary | ICD-10-CM | POA: Diagnosis not present

## 2020-09-09 DIAGNOSIS — D631 Anemia in chronic kidney disease: Secondary | ICD-10-CM | POA: Diagnosis not present

## 2020-09-09 DIAGNOSIS — Z23 Encounter for immunization: Secondary | ICD-10-CM | POA: Diagnosis not present

## 2020-09-09 DIAGNOSIS — Z992 Dependence on renal dialysis: Secondary | ICD-10-CM | POA: Diagnosis not present

## 2020-09-09 DIAGNOSIS — T8249XS Other complication of vascular dialysis catheter, sequela: Secondary | ICD-10-CM | POA: Diagnosis not present

## 2020-09-09 DIAGNOSIS — E1110 Type 2 diabetes mellitus with ketoacidosis without coma: Secondary | ICD-10-CM | POA: Diagnosis not present

## 2020-09-11 DIAGNOSIS — D631 Anemia in chronic kidney disease: Secondary | ICD-10-CM | POA: Diagnosis not present

## 2020-09-11 DIAGNOSIS — Z23 Encounter for immunization: Secondary | ICD-10-CM | POA: Diagnosis not present

## 2020-09-11 DIAGNOSIS — N186 End stage renal disease: Secondary | ICD-10-CM | POA: Diagnosis not present

## 2020-09-11 DIAGNOSIS — T8249XS Other complication of vascular dialysis catheter, sequela: Secondary | ICD-10-CM | POA: Diagnosis not present

## 2020-09-11 DIAGNOSIS — Z992 Dependence on renal dialysis: Secondary | ICD-10-CM | POA: Diagnosis not present

## 2020-09-11 DIAGNOSIS — E1110 Type 2 diabetes mellitus with ketoacidosis without coma: Secondary | ICD-10-CM | POA: Diagnosis not present

## 2020-09-11 DIAGNOSIS — N2581 Secondary hyperparathyroidism of renal origin: Secondary | ICD-10-CM | POA: Diagnosis not present

## 2020-09-14 DIAGNOSIS — D631 Anemia in chronic kidney disease: Secondary | ICD-10-CM | POA: Diagnosis not present

## 2020-09-14 DIAGNOSIS — N186 End stage renal disease: Secondary | ICD-10-CM | POA: Diagnosis not present

## 2020-09-14 DIAGNOSIS — E1110 Type 2 diabetes mellitus with ketoacidosis without coma: Secondary | ICD-10-CM | POA: Diagnosis not present

## 2020-09-14 DIAGNOSIS — N2581 Secondary hyperparathyroidism of renal origin: Secondary | ICD-10-CM | POA: Diagnosis not present

## 2020-09-14 DIAGNOSIS — Z23 Encounter for immunization: Secondary | ICD-10-CM | POA: Diagnosis not present

## 2020-09-14 DIAGNOSIS — U071 COVID-19: Secondary | ICD-10-CM | POA: Diagnosis not present

## 2020-09-14 DIAGNOSIS — T8249XS Other complication of vascular dialysis catheter, sequela: Secondary | ICD-10-CM | POA: Diagnosis not present

## 2020-09-14 DIAGNOSIS — Z992 Dependence on renal dialysis: Secondary | ICD-10-CM | POA: Diagnosis not present

## 2020-09-16 DIAGNOSIS — D631 Anemia in chronic kidney disease: Secondary | ICD-10-CM | POA: Diagnosis not present

## 2020-09-16 DIAGNOSIS — N2581 Secondary hyperparathyroidism of renal origin: Secondary | ICD-10-CM | POA: Diagnosis not present

## 2020-09-16 DIAGNOSIS — N186 End stage renal disease: Secondary | ICD-10-CM | POA: Diagnosis not present

## 2020-09-16 DIAGNOSIS — Z23 Encounter for immunization: Secondary | ICD-10-CM | POA: Diagnosis not present

## 2020-09-16 DIAGNOSIS — Z992 Dependence on renal dialysis: Secondary | ICD-10-CM | POA: Diagnosis not present

## 2020-09-16 DIAGNOSIS — E1110 Type 2 diabetes mellitus with ketoacidosis without coma: Secondary | ICD-10-CM | POA: Diagnosis not present

## 2020-09-16 DIAGNOSIS — T8249XS Other complication of vascular dialysis catheter, sequela: Secondary | ICD-10-CM | POA: Diagnosis not present

## 2020-09-18 DIAGNOSIS — E1110 Type 2 diabetes mellitus with ketoacidosis without coma: Secondary | ICD-10-CM | POA: Diagnosis not present

## 2020-09-18 DIAGNOSIS — Z23 Encounter for immunization: Secondary | ICD-10-CM | POA: Diagnosis not present

## 2020-09-18 DIAGNOSIS — T8249XS Other complication of vascular dialysis catheter, sequela: Secondary | ICD-10-CM | POA: Diagnosis not present

## 2020-09-18 DIAGNOSIS — Z992 Dependence on renal dialysis: Secondary | ICD-10-CM | POA: Diagnosis not present

## 2020-09-18 DIAGNOSIS — D631 Anemia in chronic kidney disease: Secondary | ICD-10-CM | POA: Diagnosis not present

## 2020-09-18 DIAGNOSIS — N186 End stage renal disease: Secondary | ICD-10-CM | POA: Diagnosis not present

## 2020-09-18 DIAGNOSIS — N2581 Secondary hyperparathyroidism of renal origin: Secondary | ICD-10-CM | POA: Diagnosis not present

## 2020-09-21 DIAGNOSIS — Z23 Encounter for immunization: Secondary | ICD-10-CM | POA: Diagnosis not present

## 2020-09-21 DIAGNOSIS — Z992 Dependence on renal dialysis: Secondary | ICD-10-CM | POA: Diagnosis not present

## 2020-09-21 DIAGNOSIS — N2581 Secondary hyperparathyroidism of renal origin: Secondary | ICD-10-CM | POA: Diagnosis not present

## 2020-09-21 DIAGNOSIS — E1110 Type 2 diabetes mellitus with ketoacidosis without coma: Secondary | ICD-10-CM | POA: Diagnosis not present

## 2020-09-21 DIAGNOSIS — D631 Anemia in chronic kidney disease: Secondary | ICD-10-CM | POA: Diagnosis not present

## 2020-09-21 DIAGNOSIS — T8249XS Other complication of vascular dialysis catheter, sequela: Secondary | ICD-10-CM | POA: Diagnosis not present

## 2020-09-21 DIAGNOSIS — N186 End stage renal disease: Secondary | ICD-10-CM | POA: Diagnosis not present

## 2020-09-23 DIAGNOSIS — Z23 Encounter for immunization: Secondary | ICD-10-CM | POA: Diagnosis not present

## 2020-09-23 DIAGNOSIS — N2581 Secondary hyperparathyroidism of renal origin: Secondary | ICD-10-CM | POA: Diagnosis not present

## 2020-09-23 DIAGNOSIS — Z992 Dependence on renal dialysis: Secondary | ICD-10-CM | POA: Diagnosis not present

## 2020-09-23 DIAGNOSIS — T8249XS Other complication of vascular dialysis catheter, sequela: Secondary | ICD-10-CM | POA: Diagnosis not present

## 2020-09-23 DIAGNOSIS — N186 End stage renal disease: Secondary | ICD-10-CM | POA: Diagnosis not present

## 2020-09-23 DIAGNOSIS — D631 Anemia in chronic kidney disease: Secondary | ICD-10-CM | POA: Diagnosis not present

## 2020-09-23 DIAGNOSIS — E1110 Type 2 diabetes mellitus with ketoacidosis without coma: Secondary | ICD-10-CM | POA: Diagnosis not present

## 2020-09-24 ENCOUNTER — Ambulatory Visit (INDEPENDENT_AMBULATORY_CARE_PROVIDER_SITE_OTHER): Payer: Medicare HMO | Admitting: Nurse Practitioner

## 2020-09-24 ENCOUNTER — Encounter: Payer: Self-pay | Admitting: Nurse Practitioner

## 2020-09-24 ENCOUNTER — Other Ambulatory Visit: Payer: Self-pay

## 2020-09-24 VITALS — BP 138/82 | HR 71 | Temp 98.2°F | Ht 59.2 in | Wt 136.0 lb

## 2020-09-24 DIAGNOSIS — R413 Other amnesia: Secondary | ICD-10-CM

## 2020-09-24 NOTE — Progress Notes (Signed)
I,Yamilka Roman [employer] as a Education administrator for [employer], FNP.,have documented all relevant documentation on the behalf of Minette Brine, FNP,as directed by  Minette Brine, FNP while in the presence of Minette Brine, [place]. This visit occurred during the SARS-CoV-2 public health emergency.  Safety protocols were in place, including screening questions prior to the visit, additional usage of staff PPE, and extensive cleaning of exam room while observing appropriate contact time as indicated for disinfecting solutions.  Subjective:     Patient ID: [name] [name] , female    DOB: 1936/03/09 , 85 y.o.   MRN: [id number]   Chief Complaint  Patient presents with  . Memory Loss    HPI  Patient presents today for an eval on her memory.  Her daughter Mila Palmer would like for her to be checked for memory.  She reports she is forgetting things that are short term. She does not remember being in the hospital.  Noticed before she went into the hospital in December but since being home is more. She is able to remember when she was younger. Her son lives with her during the day and family checks on her at night. She was able to cook without problems.   She is doing better now able to walk better only using a cane.   She is going to dialysis MWF - [facility]     Past Medical History:  Diagnosis Date  . Arthritis    RA  . CHF (congestive heart failure) ([place])   . CKD (chronic kidney disease) stage 3, GFR 30-59 ml/min (HCC)   . Diabetes mellitus without complication (HCC)    insulin dependent  . Hyperlipidemia   . Hypertension   . Shortness of breath dyspnea   . Sleep apnea    cpap  . TIA (transient ischemic attack) 2015     Family History  Problem Relation Age of Onset  . Diabetes type II Mother   . Hypertension Mother   . Diabetes type II Father   . Hypertension Other   . Hyperlipidemia Other   . Stroke Other      Current Outpatient Medications:  .  apixaban (ELIQUIS) 2.5 MG  TABS tablet, Take 1 tablet (2.5 mg total) by mouth 2 (two) times daily., Disp: 60 tablet, Rfl: 2 .  ascorbic acid (VITAMIN C) 500 MG tablet, Take 1 tablet (500 mg total) by mouth daily., Disp: , Rfl:  .  atorvastatin (LIPITOR) 20 MG tablet, TAKE 1 TABLET(20 MG) BY MOUTH DAILY AT 6 PM FOR CHOLESTEROL, Disp: 90 tablet, Rfl: 1 .  calcitRIOL (ROCALTROL) 0.25 MCG capsule, Take 0.25 mcg by mouth daily., Disp: , Rfl:  .  cloNIDine (CATAPRES) 0.1 MG tablet, TAKE 1 TABLET(0.1 MG) BY MOUTH THREE TIMES DAILY (Patient taking differently: Take 0.1 mg by mouth 3 (three) times daily.), Disp: 270 tablet, Rfl: 1 .  glucose blood test strip, Use as directed to check blood sugars 3 times per day dx: e11.65, Disp: 100 each, Rfl: 12 .  insulin detemir (LEVEMIR) 100 UNIT/ML FlexPen, Inject 5 Units into the skin at bedtime. May need to increase dose for high blood sugar, contact your doctor for high blood sugar. (Patient taking differently: Inject 8 Units into the skin at bedtime. May need to increase dose for high blood sugar, contact your doctor for high blood sugar.), Disp: , Rfl:  .  loratadine (CLARITIN) 10 MG tablet, Take 10 mg by mouth daily., Disp: , Rfl:  .  metoprolol tartrate (LOPRESSOR) 25  MG tablet, Take 0.5 tablets (12.5 mg total) by mouth 2 (two) times daily., Disp: 30 tablet, Rfl: 2 .  zinc sulfate 220 (50 Zn) MG capsule, Take 1 capsule (220 mg total) by mouth daily., Disp: , Rfl:  .  blood glucose meter kit and supplies KIT, Dispense based on patient and insurance preference. Use up to four times daily as directed. (FOR ICD-9 250.00, 250.01)., Disp: 1 each, Rfl: 0 .  cetirizine (ZYRTEC) 10 MG tablet, Take 10 mg by mouth daily. (Patient not taking: Reported on 09/24/2020), Disp: , Rfl:  .  donepezil (ARICEPT) 5 MG tablet, Take 1 tablet (5 mg total) by mouth at bedtime., Disp: 30 tablet, Rfl: 2   Allergies  Allergen Reactions  . Penicillins Swelling and Rash     Review of Systems  Constitutional:  Negative.   Respiratory: Negative.   Cardiovascular: Negative.   Psychiatric/Behavioral:       Memory changes over the last few months     Today's Vitals   09/24/20 1143  BP: 138/82  Pulse: 71  Temp: 98.2 F (36.8 C)  TempSrc: Oral  Weight: 136 lb (61.7 kg)  Height: 4' 11.2" (1.504 m)  PainSc: 0-No pain   Body mass index is 27.28 kg/m.   Objective:  Physical Exam Vitals reviewed.  Constitutional:      General: She is not in acute distress.    Appearance: She is obese.  Cardiovascular:     Rate and Rhythm: Normal rate and regular rhythm.     Pulses: Normal pulses.     Heart sounds: Normal heart sounds. No murmur heard.   Pulmonary:     Effort: Pulmonary effort is normal. No respiratory distress.     Breath sounds: Normal breath sounds. No wheezing.  Neurological:     General: No focal deficit present.     Mental Status: She is alert.     Cranial Nerves: No cranial nerve deficit.     Motor: No weakness.     Comments: She is having some periods of being slow to respond at times during visit  Psychiatric:        Mood and Affect: Mood normal.        Behavior: Behavior normal.        Thought Content: Thought content normal.        Judgment: Judgment normal.         Assessment And Plan:     1. Memory changes  Will check for metabolic causes pending results will start medications or refer to neurology for further evaluation  Her 6 CIT is 18 today compared to 10  She did have an extensive stay in the hospital which could have affected her memory as well - Vitamin B12 - TSH - T pallidum Screening [place]      Patient was given opportunity to ask questions. Patient verbalized understanding of the plan and was able to repeat key elements of the plan. All questions were answered to their satisfaction.  Minette Brine, FNP   I, Minette Brine, FNP, have reviewed all documentation for this visit. The documentation on 10/05/20 for the exam, diagnosis, procedures, and  orders are all accurate and complete.   THE PATIENT IS ENCOURAGED TO PRACTICE SOCIAL DISTANCING DUE TO THE COVID-19 PANDEMIC.

## 2020-09-25 DIAGNOSIS — T8249XS Other complication of vascular dialysis catheter, sequela: Secondary | ICD-10-CM | POA: Diagnosis not present

## 2020-09-25 DIAGNOSIS — D631 Anemia in chronic kidney disease: Secondary | ICD-10-CM | POA: Diagnosis not present

## 2020-09-25 DIAGNOSIS — E1110 Type 2 diabetes mellitus with ketoacidosis without coma: Secondary | ICD-10-CM | POA: Diagnosis not present

## 2020-09-25 DIAGNOSIS — N186 End stage renal disease: Secondary | ICD-10-CM | POA: Diagnosis not present

## 2020-09-25 DIAGNOSIS — Z23 Encounter for immunization: Secondary | ICD-10-CM | POA: Diagnosis not present

## 2020-09-25 DIAGNOSIS — N2581 Secondary hyperparathyroidism of renal origin: Secondary | ICD-10-CM | POA: Diagnosis not present

## 2020-09-25 DIAGNOSIS — Z992 Dependence on renal dialysis: Secondary | ICD-10-CM | POA: Diagnosis not present

## 2020-09-25 LAB — VITAMIN B12: Vitamin B-12: 958 pg/mL (ref 232–1245)

## 2020-09-25 LAB — T PALLIDUM SCREENING CASCADE: T pallidum Antibodies (TP-PA): NONREACTIVE

## 2020-09-25 LAB — TSH: TSH: 1.43 u[IU]/mL (ref 0.450–4.500)

## 2020-09-28 DIAGNOSIS — Z23 Encounter for immunization: Secondary | ICD-10-CM | POA: Diagnosis not present

## 2020-09-28 DIAGNOSIS — D631 Anemia in chronic kidney disease: Secondary | ICD-10-CM | POA: Diagnosis not present

## 2020-09-28 DIAGNOSIS — E1110 Type 2 diabetes mellitus with ketoacidosis without coma: Secondary | ICD-10-CM | POA: Diagnosis not present

## 2020-09-28 DIAGNOSIS — N186 End stage renal disease: Secondary | ICD-10-CM | POA: Diagnosis not present

## 2020-09-28 DIAGNOSIS — N2581 Secondary hyperparathyroidism of renal origin: Secondary | ICD-10-CM | POA: Diagnosis not present

## 2020-09-28 DIAGNOSIS — Z992 Dependence on renal dialysis: Secondary | ICD-10-CM | POA: Diagnosis not present

## 2020-09-28 DIAGNOSIS — T8249XS Other complication of vascular dialysis catheter, sequela: Secondary | ICD-10-CM | POA: Diagnosis not present

## 2020-09-30 ENCOUNTER — Other Ambulatory Visit: Payer: Self-pay | Admitting: Nurse Practitioner

## 2020-09-30 DIAGNOSIS — Z23 Encounter for immunization: Secondary | ICD-10-CM | POA: Diagnosis not present

## 2020-09-30 DIAGNOSIS — Z992 Dependence on renal dialysis: Secondary | ICD-10-CM | POA: Diagnosis not present

## 2020-09-30 DIAGNOSIS — T8249XS Other complication of vascular dialysis catheter, sequela: Secondary | ICD-10-CM | POA: Diagnosis not present

## 2020-09-30 DIAGNOSIS — N186 End stage renal disease: Secondary | ICD-10-CM | POA: Diagnosis not present

## 2020-09-30 DIAGNOSIS — N2581 Secondary hyperparathyroidism of renal origin: Secondary | ICD-10-CM | POA: Diagnosis not present

## 2020-09-30 DIAGNOSIS — D631 Anemia in chronic kidney disease: Secondary | ICD-10-CM | POA: Diagnosis not present

## 2020-09-30 DIAGNOSIS — E1110 Type 2 diabetes mellitus with ketoacidosis without coma: Secondary | ICD-10-CM | POA: Diagnosis not present

## 2020-09-30 DIAGNOSIS — R413 Other amnesia: Secondary | ICD-10-CM

## 2020-09-30 MED ORDER — DONEPEZIL HCL 5 MG PO TABS: 5.0000 mg | ORAL_TABLET | Freq: Every day | ORAL | 2 refills | Status: DC

## 2020-10-02 DIAGNOSIS — N186 End stage renal disease: Secondary | ICD-10-CM | POA: Diagnosis not present

## 2020-10-02 DIAGNOSIS — E1110 Type 2 diabetes mellitus with ketoacidosis without coma: Secondary | ICD-10-CM | POA: Diagnosis not present

## 2020-10-02 DIAGNOSIS — D631 Anemia in chronic kidney disease: Secondary | ICD-10-CM | POA: Diagnosis not present

## 2020-10-02 DIAGNOSIS — T8249XS Other complication of vascular dialysis catheter, sequela: Secondary | ICD-10-CM | POA: Diagnosis not present

## 2020-10-02 DIAGNOSIS — Z23 Encounter for immunization: Secondary | ICD-10-CM | POA: Diagnosis not present

## 2020-10-02 DIAGNOSIS — Z992 Dependence on renal dialysis: Secondary | ICD-10-CM | POA: Diagnosis not present

## 2020-10-02 DIAGNOSIS — N2581 Secondary hyperparathyroidism of renal origin: Secondary | ICD-10-CM | POA: Diagnosis not present

## 2020-10-05 DIAGNOSIS — E1122 Type 2 diabetes mellitus with diabetic chronic kidney disease: Secondary | ICD-10-CM | POA: Diagnosis not present

## 2020-10-05 DIAGNOSIS — N2581 Secondary hyperparathyroidism of renal origin: Secondary | ICD-10-CM | POA: Diagnosis not present

## 2020-10-05 DIAGNOSIS — D631 Anemia in chronic kidney disease: Secondary | ICD-10-CM | POA: Diagnosis not present

## 2020-10-05 DIAGNOSIS — T8249XS Other complication of vascular dialysis catheter, sequela: Secondary | ICD-10-CM | POA: Diagnosis not present

## 2020-10-05 DIAGNOSIS — Z992 Dependence on renal dialysis: Secondary | ICD-10-CM | POA: Diagnosis not present

## 2020-10-05 DIAGNOSIS — Z23 Encounter for immunization: Secondary | ICD-10-CM | POA: Diagnosis not present

## 2020-10-05 DIAGNOSIS — N186 End stage renal disease: Secondary | ICD-10-CM | POA: Diagnosis not present

## 2020-10-05 DIAGNOSIS — E1110 Type 2 diabetes mellitus with ketoacidosis without coma: Secondary | ICD-10-CM | POA: Diagnosis not present

## 2020-10-07 ENCOUNTER — Telehealth: Payer: Self-pay | Admitting: *Deleted

## 2020-10-07 DIAGNOSIS — Z992 Dependence on renal dialysis: Secondary | ICD-10-CM | POA: Diagnosis not present

## 2020-10-07 DIAGNOSIS — N2581 Secondary hyperparathyroidism of renal origin: Secondary | ICD-10-CM | POA: Diagnosis not present

## 2020-10-07 DIAGNOSIS — T8249XS Other complication of vascular dialysis catheter, sequela: Secondary | ICD-10-CM | POA: Diagnosis not present

## 2020-10-07 DIAGNOSIS — N186 End stage renal disease: Secondary | ICD-10-CM | POA: Diagnosis not present

## 2020-10-07 NOTE — Chronic Care Management (AMB) (Signed)
  Chronic Care Management   Note  10/07/2020 Name: Rashmi Tallent MRN: [id number] DOB: April 14, 1936  Aspyn Warnke Pundt is a 85 y.o. year old female who is a primary care patient of [name], MD. I reached out to [place] by phone today in response to a referral sent by Ms. Almira Coaster Toulouse's PCP, [name], MD.  Ms. Gullickson was given information about Chronic Care Management services today including:  1. CCM service includes personalized support from designated clinical staff supervised by her physician, including individualized plan of care and coordination with other care providers 2. 24/7 contact phone numbers for assistance for urgent and routine care needs. 3. Service will only be billed when office clinical staff spend 20 minutes or more in a month to coordinate care. 4. Only one practitioner may furnish and bill the service in a calendar month. 5. The patient may stop CCM services at any time (effective at the end of the month) by phone call to the office staff. 6. The patient will be responsible for cost sharing (co-pay) of up to 20% of the service fee (after annual deductible is met).  Patient agreed to services and verbal consent obtained.   Follow up plan: Telephone appointment with care management team member scheduled for:10/15/2020  [place] [facility]

## 2020-10-09 DIAGNOSIS — N186 End stage renal disease: Secondary | ICD-10-CM | POA: Diagnosis not present

## 2020-10-09 DIAGNOSIS — T8249XS Other complication of vascular dialysis catheter, sequela: Secondary | ICD-10-CM | POA: Diagnosis not present

## 2020-10-09 DIAGNOSIS — N2581 Secondary hyperparathyroidism of renal origin: Secondary | ICD-10-CM | POA: Diagnosis not present

## 2020-10-09 DIAGNOSIS — Z992 Dependence on renal dialysis: Secondary | ICD-10-CM | POA: Diagnosis not present

## 2020-10-12 DIAGNOSIS — T8249XS Other complication of vascular dialysis catheter, sequela: Secondary | ICD-10-CM | POA: Diagnosis not present

## 2020-10-12 DIAGNOSIS — U071 COVID-19: Secondary | ICD-10-CM | POA: Diagnosis not present

## 2020-10-12 DIAGNOSIS — N2581 Secondary hyperparathyroidism of renal origin: Secondary | ICD-10-CM | POA: Diagnosis not present

## 2020-10-12 DIAGNOSIS — Z992 Dependence on renal dialysis: Secondary | ICD-10-CM | POA: Diagnosis not present

## 2020-10-12 DIAGNOSIS — N186 End stage renal disease: Secondary | ICD-10-CM | POA: Diagnosis not present

## 2020-10-14 DIAGNOSIS — Z992 Dependence on renal dialysis: Secondary | ICD-10-CM | POA: Diagnosis not present

## 2020-10-14 DIAGNOSIS — N2581 Secondary hyperparathyroidism of renal origin: Secondary | ICD-10-CM | POA: Diagnosis not present

## 2020-10-14 DIAGNOSIS — T8249XS Other complication of vascular dialysis catheter, sequela: Secondary | ICD-10-CM | POA: Diagnosis not present

## 2020-10-14 DIAGNOSIS — N186 End stage renal disease: Secondary | ICD-10-CM | POA: Diagnosis not present

## 2020-10-15 ENCOUNTER — Telehealth: Payer: Medicare HMO

## 2020-10-15 ENCOUNTER — Telehealth: Payer: Self-pay

## 2020-10-15 ENCOUNTER — Ambulatory Visit: Payer: Self-pay

## 2020-10-15 ENCOUNTER — Ambulatory Visit (INDEPENDENT_AMBULATORY_CARE_PROVIDER_SITE_OTHER): Payer: Medicare HMO

## 2020-10-15 DIAGNOSIS — R413 Other amnesia: Secondary | ICD-10-CM

## 2020-10-15 DIAGNOSIS — Z992 Dependence on renal dialysis: Secondary | ICD-10-CM

## 2020-10-15 DIAGNOSIS — Z794 Long term (current) use of insulin: Secondary | ICD-10-CM

## 2020-10-15 DIAGNOSIS — E1122 Type 2 diabetes mellitus with diabetic chronic kidney disease: Secondary | ICD-10-CM

## 2020-10-15 DIAGNOSIS — I130 Hypertensive heart and chronic kidney disease with heart failure and stage 1 through stage 4 chronic kidney disease, or unspecified chronic kidney disease: Secondary | ICD-10-CM

## 2020-10-15 DIAGNOSIS — N185 Chronic kidney disease, stage 5: Secondary | ICD-10-CM

## 2020-10-15 DIAGNOSIS — N184 Chronic kidney disease, stage 4 (severe): Secondary | ICD-10-CM | POA: Diagnosis not present

## 2020-10-15 NOTE — Telephone Encounter (Signed)
  Chronic Care Management   Outreach Note  10/15/2020 Name: [name] [name] MRN: [id number] DOB: [birth year]  Referred by: [name], MD Reason for referral : Chronic Care Management   An unsuccessful telephone outreach was attempted today. The patient was referred to the case management team for assistance with care management and care coordination.   Follow Up Plan: A HIPAA compliant phone message was left for the patient providing contact information and requesting a return call.  The care management team will reach out to the patient again over the next 10 days.   Daneen Schick, BSW, CDP Social Worker, Certified Dementia Practitioner [place] [facility] [place] [facility] [phone]

## 2020-10-15 NOTE — Patient Instructions (Signed)
Social Worker Visit Information  Goals we discussed today:  Goals Addressed            This Visit's Progress   . Barriers to Treatment Identified and Managed       Timeframe:  [place] Goal Priority:  Low Start Date: 3.10.22                            Expected End Date: 7.8.22                      Next planned outreach: 5.24.22  Patient Goals/Self-Care Activities . Over the next 60 days, patient will: With the help of her daughter  - Patient will self administer medications as prescribed Patient will attend all scheduled provider appointments [place] to schedule an eye exam Follow up with Neurology for new patient appointment Contact SW as needed prior to next scheduled call

## 2020-10-15 NOTE — Chronic Care Management (AMB) (Signed)
Chronic Care Management    Social Work Note  10/15/2020 Name: Jatasia Gundrum MRN: [id number] DOB: 1936-07-02  Elianys Conry Khim is a 85 y.o. year old female who is a primary care patient of [name], MD. The CCM team was consulted to assist the patient with chronic disease management and/or care coordination needs related to: [employer] .   Engaged with patients daughter [name] by phone for initial visit in response to provider referral for social work chronic care management and care coordination services.   Consent to Services:  The patient was given information about Chronic Care Management services, agreed to services, and gave verbal consent prior to initiation of services.  Please see initial visit note for detailed documentation.   Patient agreed to services and consent obtained.   Assessment: Review of patient past medical history, allergies, medications, and health status, including review of relevant consultants reports was performed today as part of a comprehensive evaluation and provision of chronic care management and care coordination services.     SDOH (Social Determinants of Health) assessments and interventions performed:  SDOH Interventions   Flowsheet Row Most Recent Value  SDOH Interventions   Food Insecurity Interventions Intervention Not Indicated  Housing Interventions Intervention Not Indicated  Transportation Interventions Intervention Not Indicated       Advanced Directives Status: Family reports patient has AD; copy requested  CCM Care Plan  Allergies  Allergen Reactions  . Penicillins Swelling and Rash    Outpatient Encounter Medications as of 10/15/2020  Medication Sig  . apixaban (ELIQUIS) 2.5 MG TABS tablet Take 1 tablet (2.5 mg total) by mouth 2 (two) times daily.  Marland Kitchen ascorbic acid (VITAMIN C) 500 MG tablet Take 1 tablet (500 mg total) by mouth daily.  Marland Kitchen atorvastatin (LIPITOR) 20 MG tablet TAKE 1 TABLET(20 MG) BY MOUTH  DAILY AT 6 PM FOR CHOLESTEROL  . blood glucose meter kit and supplies KIT Dispense based on patient and insurance preference. Use up to four times daily as directed. (FOR ICD-9 250.00, 250.01).  . calcitRIOL (ROCALTROL) 0.25 MCG capsule Take 0.25 mcg by mouth daily.  . cetirizine (ZYRTEC) 10 MG tablet Take 10 mg by mouth daily. (Patient not taking: Reported on 09/24/2020)  . cloNIDine (CATAPRES) 0.1 MG tablet TAKE 1 TABLET(0.1 MG) BY MOUTH THREE TIMES DAILY (Patient taking differently: Take 0.1 mg by mouth 3 (three) times daily.)  . donepezil (ARICEPT) 5 MG tablet Take 1 tablet (5 mg total) by mouth at bedtime.  Marland Kitchen glucose blood test strip Use as directed to check blood sugars 3 times per day dx: e11.65  . insulin detemir (LEVEMIR) 100 UNIT/ML FlexPen Inject 5 Units into the skin at bedtime. May need to increase dose for high blood sugar, contact your doctor for high blood sugar. (Patient taking differently: Inject 8 Units into the skin at bedtime. May need to increase dose for high blood sugar, contact your doctor for high blood sugar.)  . loratadine (CLARITIN) 10 MG tablet Take 10 mg by mouth daily.  . metoprolol tartrate (LOPRESSOR) 25 MG tablet Take 0.5 tablets (12.5 mg total) by mouth 2 (two) times daily.  Marland Kitchen zinc sulfate 220 (50 Zn) MG capsule Take 1 capsule (220 mg total) by mouth daily.   No facility-administered encounter medications on file as of 10/15/2020.    Patient Active Problem List   Diagnosis Date Noted  . Thrombocytopenia ([place]) 08/12/2020  . ESRD (end stage renal disease) ([place]) 08/12/2020  . Delirium   . Acute hypoxemic  respiratory failure due to COVID-19 ([place]) 07/25/2020  . CAP (community acquired pneumonia) 07/25/2020  . CKD (chronic kidney disease), stage IV ([place]) 07/25/2020  . AKI (acute kidney injury) ([place]) 07/25/2020  . Sepsis ([place]) 07/25/2020  . Atrial fibrillation with RVR ([place]) 07/25/2020  . 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  Chronic idiopathic constipation 09/30/2018  . Chronic diastolic HF (heart failure) ([place]) 05/26/2015  . Type 2 diabetes mellitus with nephropathy ([place]) 05/26/2015  . CHF (congestive heart failure) ([place]) 05/25/2015  . Anasarca 05/25/2015  . DM (diabetes mellitus), type 2 with renal complications ([place]) 76/16/0737  . Chronic anemia 05/25/2015  . Hypocalcemia   . Encephalopathy, hypertensive 06/11/2014  . Essential hypertension 06/11/2014  . Type 2 diabetes mellitus without complication ([place]) 10/62/6948  . HLD (hyperlipidemia) 06/11/2014  . OSA (obstructive sleep apnea) 03/06/2014  . Chronic renal disease, stage III ([place]) 01/13/2014  . TIA (transient ischemic attack) 01/11/2014  . Hypertensive urgency 01/11/2014  . Diabetes mellitus ([place]) 01/11/2014    Conditions to be addressed/monitored: CKD Stage V and Hypertensive Heart Disease and Renal Disease with Heart Failure; Memory Deficits  Care Plan : Social Work [facility] Care Plan  Updates made by Daneen Schick since 10/15/2020 12:00 AM    Problem: Barriers to Treatment     [place] Goal: Barriers to Treatment Identified and Managed   Start Date: 10/15/2020  Expected End Date: 02/12/2021  This Visit's Progress: On track  Priority: Low  Note:   Current Barriers:  . Chronic disease management support and education needs related to CKD Stage V, Hypertensive Heart Disease and Renal Disease with Heart Failure . Memory Deficits  Social Worker Clinical Goal(s):  Marland Kitchen Over the next 120 days, patient and her daughter will work with SW to identify and address any acute and/or chronic care coordination needs related to the self health management of CKD Stage V and Hypertensive Heart Disease and Renal Disease with Heart Failure    CCM SW Interventions:  . Inter-disciplinary care team collaboration (see longitudinal plan of care) . Collaboration with [name], MD regarding development and update of comprehensive plan of care as evidenced by  provider attestation and co-signature . Successful outbound call placed to the patients daughter [name] to conduct a SW screen . Discussed the patient has had some declines in memory recently and has started a new medication . Referral was placed to neurology - [name] reports she received a voice message to schedule an appointment and plans to return the call later today . Determined the patient lives in a handicap accessible apartment - which includes a ramp . DME in the home includes a wheelchair, 3 in 1 commode, and a shower chair - [name] reports the patient obtained these items after a hospitalization but no longer needs or uses them . Patients family is very supportive and assists 24/7 with care needs - [name] stays with the patient M-F during the day, her older brother stays at night, and her sister stays with the patient on weekends . Discussed the family stays with the patient 24/7 to assist with medication management and out of concern the patient may be a fall risk - it is reported patient has ad no recent falls . Patient attends Dialysis M, W, F - family very happy with center and so is patient . Determined the patient is in need of an eye exam as hers is not current and family has noticed changes in vision - provided contact number  to [facility] [phone]) on [facility] who the patient has seen in the past . Encouraged [name] to contact [facility] to scheduled an office visit . Discussed [place] term follow up with SW while patient engages with  RN Case Manager  to address care management needs . Scheduled follow up call over the next 60 days . Collaboration with [place] to inform of interventions and plan  Patient Goals/Self-Care Activities . Over the next 60 days, patient will: With the help of her daughter  - Patient will self administer medications as prescribed Patient will attend all scheduled provider appointments [place] to  schedule an eye exam Follow up with Neurology for new patient appointment Contact SW as needed prior to next scheduled call  Follow Up Plan:  SW will follow up with the patient over the next 60 days       Follow Up Plan: SW will follow up with patient by phone over the next 60 days.      Daneen Schick, BSW, CDP Social Worker, Certified Dementia Practitioner [place] / [place] [facility] [phone]  Total time spent performing care coordination and/or care management activities with the patient by phone or face to face = 44 minutes.

## 2020-10-15 NOTE — Chronic Care Management (AMB) (Signed)
Chronic Care Management   CCM RN Visit Note  10/15/2020 Name: [name] [name] MRN: [id number] DOB: [birth year]  Subjective: [name] [name] is a 85 y.o. year old female who is a primary care patient of [name], MD. The care management team was consulted for assistance with disease management and care coordination needs.    Collaboration with embedded BSW Daneen Schick  for Case Collaboration  in response to provider referral for case management and/or care coordination services.   Consent to Services:  The patient was given the following information about Chronic Care Management services today, agreed to services, and gave verbal consent: 1. CCM service includes personalized support from designated clinical staff supervised by the primary care provider, including individualized plan of care and coordination with other care providers 2. 24/7 contact phone numbers for assistance for urgent and routine care needs. 3. Service will only be billed when office clinical staff spend 20 minutes or more in a month to coordinate care. 4. Only one practitioner may furnish and bill the service in a calendar month. 5.The patient may stop CCM services at any time (effective at the end of the month) by phone call to the office staff. 6. The patient will be responsible for cost sharing (co-pay) of up to 20% of the service fee (after annual deductible is met). Patient agreed to services and consent obtained.  Patient agreed to services and verbal consent obtained.   Assessment: Review of patient past medical history, allergies, medications, health status, including review of consultants reports, laboratory and other test data, was performed as part of comprehensive evaluation and provision of chronic care management services.   SDOH (Social Determinants of Health) assessments and interventions performed:  yes  CCM Care Plan  Allergies  Allergen Reactions  . Penicillins Swelling and Rash     Outpatient Encounter Medications as of 10/15/2020  Medication Sig  . apixaban (ELIQUIS) 2.5 MG TABS tablet Take 1 tablet (2.5 mg total) by mouth 2 (two) times daily.  Marland Kitchen ascorbic acid (VITAMIN C) 500 MG tablet Take 1 tablet (500 mg total) by mouth daily.  Marland Kitchen atorvastatin (LIPITOR) 20 MG tablet TAKE 1 TABLET(20 MG) BY MOUTH DAILY AT 6 PM FOR CHOLESTEROL  . blood glucose meter kit and supplies KIT Dispense based on patient and insurance preference. Use up to four times daily as directed. (FOR ICD-9 250.00, 250.01).  . calcitRIOL (ROCALTROL) 0.25 MCG capsule Take 0.25 mcg by mouth daily.  . cetirizine (ZYRTEC) 10 MG tablet Take 10 mg by mouth daily. (Patient not taking: Reported on 09/24/2020)  . cloNIDine (CATAPRES) 0.1 MG tablet TAKE 1 TABLET(0.1 MG) BY MOUTH THREE TIMES DAILY (Patient taking differently: Take 0.1 mg by mouth 3 (three) times daily.)  . donepezil (ARICEPT) 5 MG tablet Take 1 tablet (5 mg total) by mouth at bedtime.  Marland Kitchen glucose blood test strip Use as directed to check blood sugars 3 times per day dx: e11.65  . insulin detemir (LEVEMIR) 100 UNIT/ML FlexPen Inject 5 Units into the skin at bedtime. May need to increase dose for high blood sugar, contact your doctor for high blood sugar. (Patient taking differently: Inject 8 Units into the skin at bedtime. May need to increase dose for high blood sugar, contact your doctor for high blood sugar.)  . loratadine (CLARITIN) 10 MG tablet Take 10 mg by mouth daily.  . metoprolol tartrate (LOPRESSOR) 25 MG tablet Take 0.5 tablets (12.5 mg total) by mouth 2 (two) times daily.  Marland Kitchen zinc sulfate  220 (50 Zn) MG capsule Take 1 capsule (220 mg total) by mouth daily.   No facility-administered encounter medications on file as of 10/15/2020.    Patient Active Problem List   Diagnosis Date Noted  . Thrombocytopenia ([place]) 08/12/2020  . ESRD (end stage renal disease) ([place]) 08/12/2020  . Delirium   . Acute hypoxemic respiratory failure due to COVID-19  ([place]) 07/25/2020  . CAP (community acquired pneumonia) 07/25/2020  . CKD (chronic kidney disease), stage IV ([place]) 07/25/2020  . AKI (acute kidney injury) ([place]) 07/25/2020  . Sepsis ([place]) 07/25/2020  . Atrial fibrillation with RVR ([place]) 07/25/2020  . 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 Chronic idiopathic constipation 09/30/2018  . Chronic diastolic HF (heart failure) ([place]) 05/26/2015  . Type 2 diabetes mellitus with nephropathy ([place]) 05/26/2015  . CHF (congestive heart failure) ([place]) 05/25/2015  . Anasarca 05/25/2015  . DM (diabetes mellitus), type 2 with renal complications ([place]) 24/46/2863  . Chronic anemia 05/25/2015  . Hypocalcemia   . Encephalopathy, hypertensive 06/11/2014  . Essential hypertension 06/11/2014  . Type 2 diabetes mellitus without complication ([place]) 81/77/1165  . HLD (hyperlipidemia) 06/11/2014  . OSA (obstructive sleep apnea) 03/06/2014  . Chronic renal disease, stage III ([place]) 01/13/2014  . TIA (transient ischemic attack) 01/11/2014  . Hypertensive urgency 01/11/2014  . Diabetes mellitus ([place]) 01/11/2014    Conditions to be addressed/monitored:Memory changes, CKD stage V dependence on renal dialysis, Hypertensive heart disease and renal disease with heart failure  Care Plan : Assist with Chronic Care Management and Care Coordination needs  Updates made by Lynne Logan, RN since 10/15/2020 12:00 AM    Problem: Assist with Chronic Care Management and Care Coordination needs   Priority: High    Goal: Assist with Chronic Care Management and Care Coordination needs   Start Date: 10/15/2020  Expected End Date: 12/15/2020  This Visit's Progress: On track  Priority: High  Note:   Current Barriers:  Marland Kitchen Knowledge Barriers related to resources and support available to address needs related to Chronic Care Management and Care Coordination needs for Memory changes, CKD stage V dependence on renal dialysis, Hypertensive heart  disease and renal disease with heart failure  Case Manager Clinical Goal(s):  Marland Kitchen Over the next 60 days, patient will work with CCM RN CM to address needs related to disease education and support for Memory changes, CKD stage V dependence on renal dialysis, Hypertensive heart disease and renal disease with heart failure  Interventions:  . Collaborated with  embedded BSW Daneen Schick to establish an individualized plan of care   Patient Self Care Activities:  . Patient will attend all scheduled provider appointments . Patient will call provider office for new concerns or questions . Patient will work with CCM RN CM to address care coordination needs and will continue to work with the clinical team to address health care and disease management related needs.    Initial RN CM Call scheduled for 11/12/20   Plan:Telephone follow up appointment with care management team member scheduled for:  11/12/20  Barb Merino, RN, BSN, CCM Care Management Coordinator [place] [facility]  Direct Phone: [phone]

## 2020-10-16 DIAGNOSIS — Z992 Dependence on renal dialysis: Secondary | ICD-10-CM | POA: Diagnosis not present

## 2020-10-16 DIAGNOSIS — T8249XS Other complication of vascular dialysis catheter, sequela: Secondary | ICD-10-CM | POA: Diagnosis not present

## 2020-10-16 DIAGNOSIS — N2581 Secondary hyperparathyroidism of renal origin: Secondary | ICD-10-CM | POA: Diagnosis not present

## 2020-10-16 DIAGNOSIS — N186 End stage renal disease: Secondary | ICD-10-CM | POA: Diagnosis not present

## 2020-10-19 DIAGNOSIS — T8249XS Other complication of vascular dialysis catheter, sequela: Secondary | ICD-10-CM | POA: Diagnosis not present

## 2020-10-19 DIAGNOSIS — Z992 Dependence on renal dialysis: Secondary | ICD-10-CM | POA: Diagnosis not present

## 2020-10-19 DIAGNOSIS — N186 End stage renal disease: Secondary | ICD-10-CM | POA: Diagnosis not present

## 2020-10-19 DIAGNOSIS — N2581 Secondary hyperparathyroidism of renal origin: Secondary | ICD-10-CM | POA: Diagnosis not present

## 2020-10-19 DIAGNOSIS — E1110 Type 2 diabetes mellitus with ketoacidosis without coma: Secondary | ICD-10-CM | POA: Diagnosis not present

## 2020-10-21 DIAGNOSIS — N186 End stage renal disease: Secondary | ICD-10-CM | POA: Diagnosis not present

## 2020-10-21 DIAGNOSIS — T8249XS Other complication of vascular dialysis catheter, sequela: Secondary | ICD-10-CM | POA: Diagnosis not present

## 2020-10-21 DIAGNOSIS — N2581 Secondary hyperparathyroidism of renal origin: Secondary | ICD-10-CM | POA: Diagnosis not present

## 2020-10-21 DIAGNOSIS — E1110 Type 2 diabetes mellitus with ketoacidosis without coma: Secondary | ICD-10-CM | POA: Diagnosis not present

## 2020-10-21 DIAGNOSIS — Z992 Dependence on renal dialysis: Secondary | ICD-10-CM | POA: Diagnosis not present

## 2020-10-23 ENCOUNTER — Other Ambulatory Visit: Payer: Self-pay | Admitting: *Deleted

## 2020-10-23 DIAGNOSIS — E1110 Type 2 diabetes mellitus with ketoacidosis without coma: Secondary | ICD-10-CM | POA: Diagnosis not present

## 2020-10-23 DIAGNOSIS — N2581 Secondary hyperparathyroidism of renal origin: Secondary | ICD-10-CM | POA: Diagnosis not present

## 2020-10-23 DIAGNOSIS — Z992 Dependence on renal dialysis: Secondary | ICD-10-CM | POA: Diagnosis not present

## 2020-10-23 DIAGNOSIS — N184 Chronic kidney disease, stage 4 (severe): Secondary | ICD-10-CM

## 2020-10-23 DIAGNOSIS — T8249XS Other complication of vascular dialysis catheter, sequela: Secondary | ICD-10-CM | POA: Diagnosis not present

## 2020-10-23 DIAGNOSIS — N186 End stage renal disease: Secondary | ICD-10-CM | POA: Diagnosis not present

## 2020-10-26 DIAGNOSIS — N2581 Secondary hyperparathyroidism of renal origin: Secondary | ICD-10-CM | POA: Diagnosis not present

## 2020-10-26 DIAGNOSIS — Z992 Dependence on renal dialysis: Secondary | ICD-10-CM | POA: Diagnosis not present

## 2020-10-26 DIAGNOSIS — N186 End stage renal disease: Secondary | ICD-10-CM | POA: Diagnosis not present

## 2020-10-26 DIAGNOSIS — T8249XS Other complication of vascular dialysis catheter, sequela: Secondary | ICD-10-CM | POA: Diagnosis not present

## 2020-10-27 ENCOUNTER — Encounter: Payer: Medicare HMO | Admitting: Vascular Surgery

## 2020-10-27 ENCOUNTER — Ambulatory Visit (HOSPITAL_COMMUNITY): Payer: Medicare HMO | Attending: Vascular Surgery

## 2020-10-27 ENCOUNTER — Ambulatory Visit (HOSPITAL_COMMUNITY): Payer: Medicare HMO

## 2020-10-28 DIAGNOSIS — N186 End stage renal disease: Secondary | ICD-10-CM | POA: Diagnosis not present

## 2020-10-28 DIAGNOSIS — Z992 Dependence on renal dialysis: Secondary | ICD-10-CM | POA: Diagnosis not present

## 2020-10-28 DIAGNOSIS — N2581 Secondary hyperparathyroidism of renal origin: Secondary | ICD-10-CM | POA: Diagnosis not present

## 2020-10-28 DIAGNOSIS — T8249XS Other complication of vascular dialysis catheter, sequela: Secondary | ICD-10-CM | POA: Diagnosis not present

## 2020-10-30 DIAGNOSIS — Z992 Dependence on renal dialysis: Secondary | ICD-10-CM | POA: Diagnosis not present

## 2020-10-30 DIAGNOSIS — T8249XS Other complication of vascular dialysis catheter, sequela: Secondary | ICD-10-CM | POA: Diagnosis not present

## 2020-10-30 DIAGNOSIS — N2581 Secondary hyperparathyroidism of renal origin: Secondary | ICD-10-CM | POA: Diagnosis not present

## 2020-10-30 DIAGNOSIS — N186 End stage renal disease: Secondary | ICD-10-CM | POA: Diagnosis not present

## 2020-11-02 DIAGNOSIS — N2581 Secondary hyperparathyroidism of renal origin: Secondary | ICD-10-CM | POA: Diagnosis not present

## 2020-11-02 DIAGNOSIS — T8249XS Other complication of vascular dialysis catheter, sequela: Secondary | ICD-10-CM | POA: Diagnosis not present

## 2020-11-02 DIAGNOSIS — N186 End stage renal disease: Secondary | ICD-10-CM | POA: Diagnosis not present

## 2020-11-02 DIAGNOSIS — Z992 Dependence on renal dialysis: Secondary | ICD-10-CM | POA: Diagnosis not present

## 2020-11-04 DIAGNOSIS — Z992 Dependence on renal dialysis: Secondary | ICD-10-CM | POA: Diagnosis not present

## 2020-11-04 DIAGNOSIS — N186 End stage renal disease: Secondary | ICD-10-CM | POA: Diagnosis not present

## 2020-11-04 DIAGNOSIS — T8249XS Other complication of vascular dialysis catheter, sequela: Secondary | ICD-10-CM | POA: Diagnosis not present

## 2020-11-04 DIAGNOSIS — N2581 Secondary hyperparathyroidism of renal origin: Secondary | ICD-10-CM | POA: Diagnosis not present

## 2020-11-05 DIAGNOSIS — E1122 Type 2 diabetes mellitus with diabetic chronic kidney disease: Secondary | ICD-10-CM | POA: Diagnosis not present

## 2020-11-05 DIAGNOSIS — Z992 Dependence on renal dialysis: Secondary | ICD-10-CM | POA: Diagnosis not present

## 2020-11-05 DIAGNOSIS — N186 End stage renal disease: Secondary | ICD-10-CM | POA: Diagnosis not present

## 2020-11-06 DIAGNOSIS — N2581 Secondary hyperparathyroidism of renal origin: Secondary | ICD-10-CM | POA: Diagnosis not present

## 2020-11-06 DIAGNOSIS — Z992 Dependence on renal dialysis: Secondary | ICD-10-CM | POA: Diagnosis not present

## 2020-11-06 DIAGNOSIS — N186 End stage renal disease: Secondary | ICD-10-CM | POA: Diagnosis not present

## 2020-11-06 DIAGNOSIS — T8249XS Other complication of vascular dialysis catheter, sequela: Secondary | ICD-10-CM | POA: Diagnosis not present

## 2020-11-09 ENCOUNTER — Telehealth: Payer: Self-pay | Admitting: *Deleted

## 2020-11-09 DIAGNOSIS — T8249XS Other complication of vascular dialysis catheter, sequela: Secondary | ICD-10-CM | POA: Diagnosis not present

## 2020-11-09 DIAGNOSIS — Z992 Dependence on renal dialysis: Secondary | ICD-10-CM | POA: Diagnosis not present

## 2020-11-09 DIAGNOSIS — N186 End stage renal disease: Secondary | ICD-10-CM | POA: Diagnosis not present

## 2020-11-09 DIAGNOSIS — N2581 Secondary hyperparathyroidism of renal origin: Secondary | ICD-10-CM | POA: Diagnosis not present

## 2020-11-09 NOTE — Chronic Care Management (AMB) (Signed)
  Care Management   Note  11/09/2020 Name: [name] [name] MRN: [id number] DOB: 1935/09/09  [name] [name] is a 85 y.o. year old female who is a primary care patient of [name], MD and is actively engaged with the care management team. I reached out to [place] by phone today to assist with re-scheduling an initial visit with the RN Case Manager  Follow up plan: Unsuccessful telephone outreach attempt made. A HIPAA compliant phone message was left for the patient providing contact information and requesting a return call.  The care management team will reach out to the patient again over the next 7 days.  If patient returns call to provider office, please advise to call [place] Lysle Morales at [place] [facility]

## 2020-11-10 ENCOUNTER — Other Ambulatory Visit: Payer: Self-pay | Admitting: Internal Medicine

## 2020-11-11 DIAGNOSIS — Z992 Dependence on renal dialysis: Secondary | ICD-10-CM | POA: Diagnosis not present

## 2020-11-11 DIAGNOSIS — T8249XS Other complication of vascular dialysis catheter, sequela: Secondary | ICD-10-CM | POA: Diagnosis not present

## 2020-11-11 DIAGNOSIS — N2581 Secondary hyperparathyroidism of renal origin: Secondary | ICD-10-CM | POA: Diagnosis not present

## 2020-11-11 DIAGNOSIS — N186 End stage renal disease: Secondary | ICD-10-CM | POA: Diagnosis not present

## 2020-11-12 ENCOUNTER — Telehealth: Payer: Medicare HMO

## 2020-11-12 DIAGNOSIS — U071 COVID-19: Secondary | ICD-10-CM | POA: Diagnosis not present

## 2020-11-13 DIAGNOSIS — T8249XS Other complication of vascular dialysis catheter, sequela: Secondary | ICD-10-CM | POA: Diagnosis not present

## 2020-11-13 DIAGNOSIS — N186 End stage renal disease: Secondary | ICD-10-CM | POA: Diagnosis not present

## 2020-11-13 DIAGNOSIS — Z992 Dependence on renal dialysis: Secondary | ICD-10-CM | POA: Diagnosis not present

## 2020-11-13 DIAGNOSIS — N2581 Secondary hyperparathyroidism of renal origin: Secondary | ICD-10-CM | POA: Diagnosis not present

## 2020-11-13 NOTE — Chronic Care Management (AMB) (Signed)
  Care Management   Note  11/13/2020 Name: [name] [name] MRN: [id number] DOB: Oct 08, 1935  [name] [name] is a 85 y.o. year old female who is a primary care patient of [name], MD and is actively engaged with the care management team. I reached out to [place] by phone today to assist with re-scheduling an initial visit with the RN Case Manager  Follow up plan: Telephone appointment with care management team member scheduled for:  12/22/2020  [place] [facility]

## 2020-11-16 DIAGNOSIS — N186 End stage renal disease: Secondary | ICD-10-CM | POA: Diagnosis not present

## 2020-11-16 DIAGNOSIS — Z992 Dependence on renal dialysis: Secondary | ICD-10-CM | POA: Diagnosis not present

## 2020-11-16 DIAGNOSIS — T8249XS Other complication of vascular dialysis catheter, sequela: Secondary | ICD-10-CM | POA: Diagnosis not present

## 2020-11-16 DIAGNOSIS — N2581 Secondary hyperparathyroidism of renal origin: Secondary | ICD-10-CM | POA: Diagnosis not present

## 2020-11-18 ENCOUNTER — Other Ambulatory Visit: Payer: Self-pay | Admitting: Internal Medicine

## 2020-11-18 DIAGNOSIS — T8249XS Other complication of vascular dialysis catheter, sequela: Secondary | ICD-10-CM | POA: Diagnosis not present

## 2020-11-18 DIAGNOSIS — I10 Essential (primary) hypertension: Secondary | ICD-10-CM

## 2020-11-18 DIAGNOSIS — N186 End stage renal disease: Secondary | ICD-10-CM | POA: Diagnosis not present

## 2020-11-18 DIAGNOSIS — N2581 Secondary hyperparathyroidism of renal origin: Secondary | ICD-10-CM | POA: Diagnosis not present

## 2020-11-18 DIAGNOSIS — Z992 Dependence on renal dialysis: Secondary | ICD-10-CM | POA: Diagnosis not present

## 2020-11-19 ENCOUNTER — Ambulatory Visit
Admission: RE | Admit: 2020-11-19 | Discharge: 2020-11-19 | Disposition: A | Discharge status: Home / Self Care | Payer: Medicare HMO | Source: Ambulatory Visit | Attending: Nurse Practitioner | Admitting: Nurse Practitioner

## 2020-11-19 DIAGNOSIS — R413 Other amnesia: Secondary | ICD-10-CM

## 2020-11-19 DIAGNOSIS — G9389 Other specified disorders of brain: Secondary | ICD-10-CM | POA: Diagnosis not present

## 2020-11-20 DIAGNOSIS — Z992 Dependence on renal dialysis: Secondary | ICD-10-CM | POA: Diagnosis not present

## 2020-11-20 DIAGNOSIS — N186 End stage renal disease: Secondary | ICD-10-CM | POA: Diagnosis not present

## 2020-11-20 DIAGNOSIS — T8249XS Other complication of vascular dialysis catheter, sequela: Secondary | ICD-10-CM | POA: Diagnosis not present

## 2020-11-20 DIAGNOSIS — N2581 Secondary hyperparathyroidism of renal origin: Secondary | ICD-10-CM | POA: Diagnosis not present

## 2020-11-23 ENCOUNTER — Other Ambulatory Visit: Payer: Self-pay

## 2020-11-23 DIAGNOSIS — N2581 Secondary hyperparathyroidism of renal origin: Secondary | ICD-10-CM | POA: Diagnosis not present

## 2020-11-23 DIAGNOSIS — Z992 Dependence on renal dialysis: Secondary | ICD-10-CM | POA: Diagnosis not present

## 2020-11-23 DIAGNOSIS — E1110 Type 2 diabetes mellitus with ketoacidosis without coma: Secondary | ICD-10-CM | POA: Diagnosis not present

## 2020-11-23 DIAGNOSIS — T8249XS Other complication of vascular dialysis catheter, sequela: Secondary | ICD-10-CM | POA: Diagnosis not present

## 2020-11-23 DIAGNOSIS — N186 End stage renal disease: Secondary | ICD-10-CM | POA: Diagnosis not present

## 2020-11-23 MED ORDER — APIXABAN 2.5 MG PO TABS: 2.5000 mg | ORAL_TABLET | Freq: Two times a day (BID) | ORAL | 2 refills | Status: DC

## 2020-11-25 DIAGNOSIS — E1110 Type 2 diabetes mellitus with ketoacidosis without coma: Secondary | ICD-10-CM | POA: Diagnosis not present

## 2020-11-25 DIAGNOSIS — N186 End stage renal disease: Secondary | ICD-10-CM | POA: Diagnosis not present

## 2020-11-25 DIAGNOSIS — N2581 Secondary hyperparathyroidism of renal origin: Secondary | ICD-10-CM | POA: Diagnosis not present

## 2020-11-25 DIAGNOSIS — Z992 Dependence on renal dialysis: Secondary | ICD-10-CM | POA: Diagnosis not present

## 2020-11-25 DIAGNOSIS — T8249XS Other complication of vascular dialysis catheter, sequela: Secondary | ICD-10-CM | POA: Diagnosis not present

## 2020-11-27 DIAGNOSIS — N2581 Secondary hyperparathyroidism of renal origin: Secondary | ICD-10-CM | POA: Diagnosis not present

## 2020-11-27 DIAGNOSIS — Z992 Dependence on renal dialysis: Secondary | ICD-10-CM | POA: Diagnosis not present

## 2020-11-27 DIAGNOSIS — E1110 Type 2 diabetes mellitus with ketoacidosis without coma: Secondary | ICD-10-CM | POA: Diagnosis not present

## 2020-11-27 DIAGNOSIS — T8249XS Other complication of vascular dialysis catheter, sequela: Secondary | ICD-10-CM | POA: Diagnosis not present

## 2020-11-27 DIAGNOSIS — N186 End stage renal disease: Secondary | ICD-10-CM | POA: Diagnosis not present

## 2020-11-29 NOTE — Progress Notes (Signed)
[facility] [place]  ASSESSMENT / PLAN: [name] [name] is a 85 y.o. right handed female in need of permanent hemodialysis access. I reviewed options for dialysis in detail with the patient. I counseled the patient that dialysis access requires surveillance and periodic maintenance. Plan to proceed with left upper extremity arteriovenous graft.   CHIEF COMPLAINT: in need of dialysis access  HISTORY OF PRESENT ILLNESS: [name] [name] is a 85 y.o. female presents for dialysis access surgery.  She is currently on hemodialysis through a right internal jugular tunneled dialysis catheter.  She dialyzes Monday, Wednesday, and Friday.  She has never had a pacemaker or other central venous catheter.  She has never had dialysis access surgery.  She is right-handed.  Past Medical History:  Diagnosis Date  . Arthritis    RA  . CHF (congestive heart failure) ([place])   . CKD (chronic kidney disease) stage 3, GFR 30-59 ml/min (HCC)   . Diabetes mellitus without complication (HCC)    insulin dependent  . Hyperlipidemia   . Hypertension   . Shortness of breath dyspnea   . Sleep apnea    cpap  . TIA (transient ischemic attack) 2015    Past Surgical History:  Procedure Laterality Date  . ABDOMINAL HYSTERECTOMY  1974  . IR FLUORO GUIDE CV LINE RIGHT  07/29/2020  . IR FLUORO GUIDE CV LINE RIGHT  08/04/2020  . IR US GUIDE VASC ACCESS RIGHT  07/29/2020    Family History  Problem Relation Age of Onset  . Diabetes type II Mother   . Hypertension Mother   . Diabetes type II Father   . Hypertension Other   . Hyperlipidemia Other   . Stroke Other     Social History   Socioeconomic History  . Marital status: Married    Spouse name: Not on file  . Number of children: 6  . Years of education: 12th  . Highest education level: Not on file  Occupational History  . Occupation: retired    Fish farm manager: RETIRED  Tobacco Use  . Smoking status: Never Smoker  .  Smokeless tobacco: Never Used  Vaping Use  . Vaping Use: Never used  Substance and Sexual Activity  . Alcohol use: No    Alcohol/week: 0.0 standard drinks  . Drug use: No  . Sexual activity: Not Currently  Other Topics Concern  . Not on file  Social History Narrative   Patient lives with her husband    Patient is right handed   Patient drinks tea   Social Determinants of Health   Financial Resource Strain: Low Risk   . Difficulty of Paying Living Expenses: Not hard at all  Food Insecurity: No Food Insecurity  . Worried About Charity fundraiser in the Last Year: Never true  . Ran Out of Food in the Last Year: Never true  Transportation Needs: No Transportation Needs  . Lack of Transportation (Medical): No  . Lack of Transportation (Non-Medical): No  Physical Activity: Inactive  . Days of Exercise per Week: 0 days  . Minutes of Exercise per Session: 0 min  Stress: No Stress Concern Present  . Feeling of Stress : Not at all  Social Connections: Not on file  Intimate Partner Violence: Not on file    Allergies  Allergen Reactions  . Penicillins Swelling and Rash    Current Outpatient Medications  Medication Sig Dispense Refill  . apixaban (ELIQUIS) 2.5 MG TABS tablet Take 1 tablet (2.5 mg  total) by mouth 2 (two) times daily. 60 tablet 2  . ascorbic acid (VITAMIN C) 500 MG tablet Take 1 tablet (500 mg total) by mouth daily.    Marland Kitchen atorvastatin (LIPITOR) 20 MG tablet TAKE 1 TABLET(20 MG) BY MOUTH DAILY AT 6 PM FOR CHOLESTEROL 90 tablet 1  . blood glucose meter kit and supplies KIT Dispense based on patient and insurance preference. Use up to four times daily as directed. (FOR ICD-9 250.00, 250.01). 1 each 0  . calcitRIOL (ROCALTROL) 0.25 MCG capsule Take 0.25 mcg by mouth daily.    . cetirizine (ZYRTEC) 10 MG tablet Take 10 mg by mouth daily. (Patient not taking: Reported on 09/24/2020)    . cloNIDine (CATAPRES) 0.1 MG tablet TAKE 1 TABLET(0.1 MG) BY MOUTH THREE TIMES DAILY 270  tablet 1  . donepezil (ARICEPT) 5 MG tablet Take 1 tablet (5 mg total) by mouth at bedtime. 30 tablet 2  . glucose blood test strip Use as directed to check blood sugars 3 times per day dx: e11.65 100 each 12  . hydrALAZINE (APRESOLINE) 25 MG tablet TAKE 1 TABLET BY MOUTH THREE TIMES DAILY WITH FOOD 270 tablet 1  . insulin detemir (LEVEMIR) 100 UNIT/ML FlexPen Inject 5 Units into the skin at bedtime. May need to increase dose for high blood sugar, contact your doctor for high blood sugar. (Patient taking differently: Inject 8 Units into the skin at bedtime. May need to increase dose for high blood sugar, contact your doctor for high blood sugar.)    . loratadine (CLARITIN) 10 MG tablet Take 10 mg by mouth daily.    . metoprolol tartrate (LOPRESSOR) 25 MG tablet TAKE 1/2 TABLET(12.5 MG) BY MOUTH TWICE DAILY 90 tablet 1  . zinc sulfate 220 (50 Zn) MG capsule Take 1 capsule (220 mg total) by mouth daily.     No current facility-administered medications for this visit.    REVIEW OF SYSTEMS:  _0  denotes positive finding, _1  denotes negative finding Cardiac  Comments:  Chest pain or chest pressure:    Shortness of breath upon exertion:    Short of breath when lying flat:    Irregular heart rhythm:        Vascular    Pain in calf, thigh, or hip brought on by ambulation:    Pain in feet at night that wakes you up from your sleep:     Blood clot in your veins:    Leg swelling:         Pulmonary    Oxygen at home:    Productive cough:     Wheezing:         Neurologic    Sudden weakness in arms or legs:     Sudden numbness in arms or legs:     Sudden onset of difficulty speaking or slurred speech:    Temporary loss of vision in one eye:     Problems with dizziness:         Gastrointestinal    Blood in stool:     Vomited blood:         Genitourinary    Burning when urinating:     Blood in urine:        Psychiatric    Major depression:         Hematologic    Bleeding problems:     Problems with blood clotting too easily:        Skin    Rashes or ulcers:  Constitutional    Fever or chills:      PHYSICAL EXAM  Vitals:   12/01/20 1303  BP: 136/78  Pulse: 71  Resp: 16  Temp: 97.7 F (36.5 C)  SpO2: 99%  Weight: 134 lb (60.8 kg)  Height: _0  (1.499 m)    Constitutional: elderly, but well appearing. no distress. Appears well nourished.  Neurologic: CN intact. no focal findings. no sensory loss. Psychiatric: Mood and affect symmetric and appropriate. Eyes: No icterus. No conjunctival pallor. Ears, nose, throat: mucous membranes moist. Midline trachea.  Cardiac: regular rate and rhythm.  Respiratory: unlabored. Abdominal: soft, non-tender, non-distended.  Extremity: No edema. No cyanosis. No pallor.  Skin: No gangrene. No ulceration.  Lymphatic: No Stemmer's sign. No palpable lymphadenopathy.  PERTINENT LABORATORY AND RADIOLOGIC DATA  Most recent CBC CBC Latest Ref Rng & Units 08/14/2020 08/13/2020 08/12/2020  WBC 4.0 - 10.5 K/uL 8.2 8.8 9.0  Hemoglobin 12.0 - 15.0 g/dL 8.3(L) 9.1(L) 9.0(L)  Hematocrit 36.0 - 46.0 % 26.4(L) 27.2(L) 28.1(L)  Platelets 150 - 400 K/uL 85(L) 80(L) 82(L)     Most recent CMP CMP Latest Ref Rng & Units 08/14/2020 08/12/2020 08/10/2020  Glucose 70 - 99 mg/dL 173(H) 206(H) 36(LL)  BUN 8 - 23 mg/dL 29(H) 45(H) 62(H)  Creatinine 0.44 - 1.00 mg/dL 5.37(H) 5.61(H) 6.08(H)  Sodium 135 - 145 mmol/L 134(L) 134(L) 135  Potassium 3.5 - 5.1 mmol/L 4.1 4.1 3.5  Chloride 98 - 111 mmol/L 96(L) 97(L) 97(L)  CO2 22 - 32 mmol/L _1 Calcium 8.9 - 10.3 mg/dL 7.5(L) 7.3(L) 7.9(L)  Total Protein 6.5 - 8.1 g/dL - - -  Total Bilirubin 0.3 - 1.2 mg/dL - - -  Alkaline Phos 38 - 126 U/L - - -  AST 15 - 41 U/L - - -  ALT 0 - 44 U/L - - -    Renal function CrCl cannot be calculated (Patient's most recent lab result is older than the maximum 21 days allowed.).  Hgb A1c MFr Bld (%)  Date Value  07/29/2020 8.0 (H)    LDL Chol Calc  (NIH)  Date Value Ref Range Status  01/30/2020 74 0 - 99 mg/dL Final   LDL Cholesterol  Date Value Ref Range Status  07/29/2020 57 0 - 99 mg/dL Final    Comment:           Total Cholesterol/HDL:CHD Risk Coronary Heart Disease Risk Table                     Men   Women  1/2 Average Risk   3.4   3.3  Average Risk       5.0   4.4  2 X Average Risk   9.6   7.1  3 X Average Risk  23.4   11.0        Use the calculated Patient Ratio above and the CHD Risk Table to determine the patient's CHD Risk.        ATP III CLASSIFICATION (LDL):  <100     mg/dL   Optimal  100-129  mg/dL   Near or Above                    Optimal  130-159  mg/dL   Borderline  160-189  mg/dL   High  >190     mg/dL   Very High Performed at [facility] [address] [place], [place] [address]  Vascular Imaging: Mapping reviewed, no suitable vein for autogenous fistula creation  Yevonne Aline. Stanford Breed, MD [facility] Phone Number: [phone] 11/29/2020 10:10 AM

## 2020-11-29 NOTE — H&P (View-Only) (Signed)
[facility] OF [place]  ASSESSMENT / PLAN: [name] [name] is a 85 y.o. right handed female in need of permanent hemodialysis access. I reviewed options for dialysis in detail with the patient. I counseled the patient that dialysis access requires surveillance and periodic maintenance. Plan to proceed with left upper extremity arteriovenous graft.   CHIEF COMPLAINT: in need of dialysis access  HISTORY OF PRESENT ILLNESS: [name] [name] is a 85 y.o. female presents for dialysis access surgery.  She is currently on hemodialysis through a right internal jugular tunneled dialysis catheter.  She dialyzes Monday, Wednesday, and Friday.  She has never had a pacemaker or other central venous catheter.  She has never had dialysis access surgery.  She is right-handed.  Past Medical History:  Diagnosis Date  . Arthritis    RA  . CHF (congestive heart failure) ([place])   . CKD (chronic kidney disease) stage 3, GFR 30-59 ml/min (HCC)   . Diabetes mellitus without complication (HCC)    insulin dependent  . Hyperlipidemia   . Hypertension   . Shortness of breath dyspnea   . Sleep apnea    cpap  . TIA (transient ischemic attack) 2015    Past Surgical History:  Procedure Laterality Date  . ABDOMINAL HYSTERECTOMY  1974  . IR FLUORO GUIDE CV LINE RIGHT  07/29/2020  . IR FLUORO GUIDE CV LINE RIGHT  08/04/2020  . IR US GUIDE VASC ACCESS RIGHT  07/29/2020    Family History  Problem Relation Age of Onset  . Diabetes type II Mother   . Hypertension Mother   . Diabetes type II Father   . Hypertension Other   . Hyperlipidemia Other   . Stroke Other     Social History   Socioeconomic History  . Marital status: Married    Spouse name: Not on file  . Number of children: 6  . Years of education: 12th  . Highest education level: Not on file  Occupational History  . Occupation: retired    Fish farm manager: RETIRED  Tobacco Use  . Smoking status: Never Smoker  .  Smokeless tobacco: Never Used  Vaping Use  . Vaping Use: Never used  Substance and Sexual Activity  . Alcohol use: No    Alcohol/week: 0.0 standard drinks  . Drug use: No  . Sexual activity: Not Currently  Other Topics Concern  . Not on file  Social History Narrative   Patient lives with her husband    Patient is right handed   Patient drinks tea   Social Determinants of Health   Financial Resource Strain: Low Risk   . Difficulty of Paying Living Expenses: Not hard at all  Food Insecurity: No Food Insecurity  . Worried About Charity fundraiser in the Last Year: Never true  . Ran Out of Food in the Last Year: Never true  Transportation Needs: No Transportation Needs  . Lack of Transportation (Medical): No  . Lack of Transportation (Non-Medical): No  Physical Activity: Inactive  . Days of Exercise per Week: 0 days  . Minutes of Exercise per Session: 0 min  Stress: No Stress Concern Present  . Feeling of Stress : Not at all  Social Connections: Not on file  Intimate Partner Violence: Not on file    Allergies  Allergen Reactions  . Penicillins Swelling and Rash    Current Outpatient Medications  Medication Sig Dispense Refill  . apixaban (ELIQUIS) 2.5 MG TABS tablet Take 1 tablet (2.5 mg  total) by mouth 2 (two) times daily. 60 tablet 2  . ascorbic acid (VITAMIN C) 500 MG tablet Take 1 tablet (500 mg total) by mouth daily.    Marland Kitchen atorvastatin (LIPITOR) 20 MG tablet TAKE 1 TABLET(20 MG) BY MOUTH DAILY AT 6 PM FOR CHOLESTEROL 90 tablet 1  . blood glucose meter kit and supplies KIT Dispense based on patient and insurance preference. Use up to four times daily as directed. (FOR ICD-9 250.00, 250.01). 1 each 0  . calcitRIOL (ROCALTROL) 0.25 MCG capsule Take 0.25 mcg by mouth daily.    . cetirizine (ZYRTEC) 10 MG tablet Take 10 mg by mouth daily. (Patient not taking: Reported on 09/24/2020)    . cloNIDine (CATAPRES) 0.1 MG tablet TAKE 1 TABLET(0.1 MG) BY MOUTH THREE TIMES DAILY 270  tablet 1  . donepezil (ARICEPT) 5 MG tablet Take 1 tablet (5 mg total) by mouth at bedtime. 30 tablet 2  . glucose blood test strip Use as directed to check blood sugars 3 times per day dx: e11.65 100 each 12  . hydrALAZINE (APRESOLINE) 25 MG tablet TAKE 1 TABLET BY MOUTH THREE TIMES DAILY WITH FOOD 270 tablet 1  . insulin detemir (LEVEMIR) 100 UNIT/ML FlexPen Inject 5 Units into the skin at bedtime. May need to increase dose for high blood sugar, contact your doctor for high blood sugar. (Patient taking differently: Inject 8 Units into the skin at bedtime. May need to increase dose for high blood sugar, contact your doctor for high blood sugar.)    . loratadine (CLARITIN) 10 MG tablet Take 10 mg by mouth daily.    . metoprolol tartrate (LOPRESSOR) 25 MG tablet TAKE 1/2 TABLET(12.5 MG) BY MOUTH TWICE DAILY 90 tablet 1  . zinc sulfate 220 (50 Zn) MG capsule Take 1 capsule (220 mg total) by mouth daily.     No current facility-administered medications for this visit.    REVIEW OF SYSTEMS:  _0  denotes positive finding, _1  denotes negative finding Cardiac  Comments:  Chest pain or chest pressure:    Shortness of breath upon exertion:    Short of breath when lying flat:    Irregular heart rhythm:        Vascular    Pain in calf, thigh, or hip brought on by ambulation:    Pain in feet at night that wakes you up from your sleep:     Blood clot in your veins:    Leg swelling:         Pulmonary    Oxygen at home:    Productive cough:     Wheezing:         Neurologic    Sudden weakness in arms or legs:     Sudden numbness in arms or legs:     Sudden onset of difficulty speaking or slurred speech:    Temporary loss of vision in one eye:     Problems with dizziness:         Gastrointestinal    Blood in stool:     Vomited blood:         Genitourinary    Burning when urinating:     Blood in urine:        Psychiatric    Major depression:         Hematologic    Bleeding problems:     Problems with blood clotting too easily:        Skin    Rashes or ulcers:  Constitutional    Fever or chills:      PHYSICAL EXAM  Vitals:   12/01/20 1303  BP: 136/78  Pulse: 71  Resp: 16  Temp: 97.7 F (36.5 C)  SpO2: 99%  Weight: 134 lb (60.8 kg)  Height: _0  (1.499 m)    Constitutional: elderly, but well appearing. no distress. Appears well nourished.  Neurologic: CN intact. no focal findings. no sensory loss. Psychiatric: Mood and affect symmetric and appropriate. Eyes: No icterus. No conjunctival pallor. Ears, nose, throat: mucous membranes moist. Midline trachea.  Cardiac: regular rate and rhythm.  Respiratory: unlabored. Abdominal: soft, non-tender, non-distended.  Extremity: No edema. No cyanosis. No pallor.  Skin: No gangrene. No ulceration.  Lymphatic: No Stemmer's sign. No palpable lymphadenopathy.  PERTINENT LABORATORY AND RADIOLOGIC DATA  Most recent CBC CBC Latest Ref Rng & Units 08/14/2020 08/13/2020 08/12/2020  WBC 4.0 - 10.5 K/uL 8.2 8.8 9.0  Hemoglobin 12.0 - 15.0 g/dL 8.3(L) 9.1(L) 9.0(L)  Hematocrit 36.0 - 46.0 % 26.4(L) 27.2(L) 28.1(L)  Platelets 150 - 400 K/uL 85(L) 80(L) 82(L)     Most recent CMP CMP Latest Ref Rng & Units 08/14/2020 08/12/2020 08/10/2020  Glucose 70 - 99 mg/dL 173(H) 206(H) 36(LL)  BUN 8 - 23 mg/dL 29(H) 45(H) 62(H)  Creatinine 0.44 - 1.00 mg/dL 5.37(H) 5.61(H) 6.08(H)  Sodium 135 - 145 mmol/L 134(L) 134(L) 135  Potassium 3.5 - 5.1 mmol/L 4.1 4.1 3.5  Chloride 98 - 111 mmol/L 96(L) 97(L) 97(L)  CO2 22 - 32 mmol/L _1 Calcium 8.9 - 10.3 mg/dL 7.5(L) 7.3(L) 7.9(L)  Total Protein 6.5 - 8.1 g/dL - - -  Total Bilirubin 0.3 - 1.2 mg/dL - - -  Alkaline Phos 38 - 126 U/L - - -  AST 15 - 41 U/L - - -  ALT 0 - 44 U/L - - -    Renal function CrCl cannot be calculated (Patient's most recent lab result is older than the maximum 21 days allowed.).  Hgb A1c MFr Bld (%)  Date Value  07/29/2020 8.0 (H)    LDL Chol Calc  (NIH)  Date Value Ref Range Status  01/30/2020 74 0 - 99 mg/dL Final   LDL Cholesterol  Date Value Ref Range Status  07/29/2020 57 0 - 99 mg/dL Final    Comment:           Total Cholesterol/HDL:CHD Risk Coronary Heart Disease Risk Table                     Men   Women  1/2 Average Risk   3.4   3.3  Average Risk       5.0   4.4  2 X Average Risk   9.6   7.1  3 X Average Risk  23.4   11.0        Use the calculated Patient Ratio above and the CHD Risk Table to determine the patient's CHD Risk.        ATP III CLASSIFICATION (LDL):  <100     mg/dL   Optimal  100-129  mg/dL   Near or Above                    Optimal  130-159  mg/dL   Borderline  160-189  mg/dL   High  >190     mg/dL   Very High Performed at [facility] [address] [place], [place] [address]  Vascular Imaging: Mapping reviewed, no suitable vein for autogenous fistula creation  Yevonne Aline. Stanford Breed, MD [facility] of [facility] Phone Number: [phone] 11/29/2020 10:10 AM

## 2020-11-30 DIAGNOSIS — Z992 Dependence on renal dialysis: Secondary | ICD-10-CM | POA: Diagnosis not present

## 2020-11-30 DIAGNOSIS — T8249XS Other complication of vascular dialysis catheter, sequela: Secondary | ICD-10-CM | POA: Diagnosis not present

## 2020-11-30 DIAGNOSIS — Z23 Encounter for immunization: Secondary | ICD-10-CM | POA: Diagnosis not present

## 2020-11-30 DIAGNOSIS — D509 Iron deficiency anemia, unspecified: Secondary | ICD-10-CM | POA: Diagnosis not present

## 2020-11-30 DIAGNOSIS — N186 End stage renal disease: Secondary | ICD-10-CM | POA: Diagnosis not present

## 2020-11-30 DIAGNOSIS — N2581 Secondary hyperparathyroidism of renal origin: Secondary | ICD-10-CM | POA: Diagnosis not present

## 2020-12-01 ENCOUNTER — Encounter: Payer: Medicare HMO | Admitting: Vascular Surgery

## 2020-12-01 ENCOUNTER — Other Ambulatory Visit: Payer: Self-pay

## 2020-12-01 ENCOUNTER — Ambulatory Visit: Payer: Medicare HMO | Admitting: Vascular Surgery

## 2020-12-01 ENCOUNTER — Ambulatory Visit (INDEPENDENT_AMBULATORY_CARE_PROVIDER_SITE_OTHER)
Admission: RE | Admit: 2020-12-01 | Discharge: 2020-12-01 | Disposition: A | Discharge status: Home / Self Care | Payer: Medicare HMO | Source: Ambulatory Visit | Attending: Vascular Surgery | Admitting: Vascular Surgery

## 2020-12-01 ENCOUNTER — Ambulatory Visit (HOSPITAL_COMMUNITY)
Admission: RE | Admit: 2020-12-01 | Discharge: 2020-12-01 | Disposition: A | Discharge status: Home / Self Care | Payer: Medicare HMO | Source: Ambulatory Visit | Attending: Vascular Surgery | Admitting: Vascular Surgery

## 2020-12-01 ENCOUNTER — Encounter: Payer: Self-pay | Admitting: Vascular Surgery

## 2020-12-01 VITALS — BP 136/78 | HR 71 | Temp 97.7°F | Resp 16 | Ht 59.0 in | Wt 134.0 lb

## 2020-12-01 DIAGNOSIS — N186 End stage renal disease: Secondary | ICD-10-CM | POA: Diagnosis not present

## 2020-12-01 DIAGNOSIS — N184 Chronic kidney disease, stage 4 (severe): Secondary | ICD-10-CM | POA: Diagnosis not present

## 2020-12-01 DIAGNOSIS — E113553 Type 2 diabetes mellitus with stable proliferative diabetic retinopathy, bilateral: Secondary | ICD-10-CM | POA: Diagnosis not present

## 2020-12-01 DIAGNOSIS — Z961 Presence of intraocular lens: Secondary | ICD-10-CM | POA: Diagnosis not present

## 2020-12-01 DIAGNOSIS — Z992 Dependence on renal dialysis: Secondary | ICD-10-CM | POA: Diagnosis not present

## 2020-12-01 LAB — HM DIABETES EYE EXAM

## 2020-12-02 DIAGNOSIS — Z23 Encounter for immunization: Secondary | ICD-10-CM | POA: Diagnosis not present

## 2020-12-02 DIAGNOSIS — T8249XS Other complication of vascular dialysis catheter, sequela: Secondary | ICD-10-CM | POA: Diagnosis not present

## 2020-12-02 DIAGNOSIS — N186 End stage renal disease: Secondary | ICD-10-CM | POA: Diagnosis not present

## 2020-12-02 DIAGNOSIS — Z992 Dependence on renal dialysis: Secondary | ICD-10-CM | POA: Diagnosis not present

## 2020-12-02 DIAGNOSIS — D509 Iron deficiency anemia, unspecified: Secondary | ICD-10-CM | POA: Diagnosis not present

## 2020-12-02 DIAGNOSIS — N2581 Secondary hyperparathyroidism of renal origin: Secondary | ICD-10-CM | POA: Diagnosis not present

## 2020-12-03 ENCOUNTER — Telehealth: Payer: Self-pay

## 2020-12-03 NOTE — Telephone Encounter (Signed)
-----   Message from Minette Brine, [place] sent at 11/23/2020  8:36 AM EDT ----- No acute problems with your CT scan of the head, on changes from previous CT scan. The memory changes may actually be related to her some cognitive loss. We will see how she is doing after she has been on the donepezil for her memory

## 2020-12-04 ENCOUNTER — Encounter: Payer: Self-pay | Admitting: Internal Medicine

## 2020-12-04 DIAGNOSIS — Z23 Encounter for immunization: Secondary | ICD-10-CM | POA: Diagnosis not present

## 2020-12-04 DIAGNOSIS — N2581 Secondary hyperparathyroidism of renal origin: Secondary | ICD-10-CM | POA: Diagnosis not present

## 2020-12-04 DIAGNOSIS — D509 Iron deficiency anemia, unspecified: Secondary | ICD-10-CM | POA: Diagnosis not present

## 2020-12-04 DIAGNOSIS — N186 End stage renal disease: Secondary | ICD-10-CM | POA: Diagnosis not present

## 2020-12-04 DIAGNOSIS — Z992 Dependence on renal dialysis: Secondary | ICD-10-CM | POA: Diagnosis not present

## 2020-12-04 DIAGNOSIS — T8249XS Other complication of vascular dialysis catheter, sequela: Secondary | ICD-10-CM | POA: Diagnosis not present

## 2020-12-05 DIAGNOSIS — N186 End stage renal disease: Secondary | ICD-10-CM | POA: Diagnosis not present

## 2020-12-05 DIAGNOSIS — E1122 Type 2 diabetes mellitus with diabetic chronic kidney disease: Secondary | ICD-10-CM | POA: Diagnosis not present

## 2020-12-05 DIAGNOSIS — Z992 Dependence on renal dialysis: Secondary | ICD-10-CM | POA: Diagnosis not present

## 2020-12-07 DIAGNOSIS — N2581 Secondary hyperparathyroidism of renal origin: Secondary | ICD-10-CM | POA: Diagnosis not present

## 2020-12-07 DIAGNOSIS — N186 End stage renal disease: Secondary | ICD-10-CM | POA: Diagnosis not present

## 2020-12-07 DIAGNOSIS — D509 Iron deficiency anemia, unspecified: Secondary | ICD-10-CM | POA: Diagnosis not present

## 2020-12-07 DIAGNOSIS — Z992 Dependence on renal dialysis: Secondary | ICD-10-CM | POA: Diagnosis not present

## 2020-12-07 DIAGNOSIS — T8249XS Other complication of vascular dialysis catheter, sequela: Secondary | ICD-10-CM | POA: Diagnosis not present

## 2020-12-08 ENCOUNTER — Other Ambulatory Visit (HOSPITAL_COMMUNITY)
Admission: RE | Admit: 2020-12-08 | Discharge: 2020-12-08 | Disposition: A | Discharge status: Home / Self Care | Payer: Medicare HMO | Source: Ambulatory Visit | Attending: Surgery | Admitting: Surgery

## 2020-12-08 DIAGNOSIS — Z20822 Contact with and (suspected) exposure to covid-19: Secondary | ICD-10-CM | POA: Insufficient documentation

## 2020-12-08 DIAGNOSIS — Z01812 Encounter for preprocedural laboratory examination: Secondary | ICD-10-CM | POA: Insufficient documentation

## 2020-12-09 ENCOUNTER — Encounter (HOSPITAL_COMMUNITY): Payer: Self-pay | Admitting: Surgery

## 2020-12-09 ENCOUNTER — Other Ambulatory Visit: Payer: Self-pay

## 2020-12-09 ENCOUNTER — Other Ambulatory Visit (HOSPITAL_COMMUNITY): Payer: Medicare HMO

## 2020-12-09 DIAGNOSIS — N186 End stage renal disease: Secondary | ICD-10-CM | POA: Diagnosis not present

## 2020-12-09 DIAGNOSIS — T8249XS Other complication of vascular dialysis catheter, sequela: Secondary | ICD-10-CM | POA: Diagnosis not present

## 2020-12-09 DIAGNOSIS — D509 Iron deficiency anemia, unspecified: Secondary | ICD-10-CM | POA: Diagnosis not present

## 2020-12-09 DIAGNOSIS — Z992 Dependence on renal dialysis: Secondary | ICD-10-CM | POA: Diagnosis not present

## 2020-12-09 DIAGNOSIS — N2581 Secondary hyperparathyroidism of renal origin: Secondary | ICD-10-CM | POA: Diagnosis not present

## 2020-12-09 LAB — SARS CORONAVIRUS 2 (TAT 6-24 HRS): SARS Coronavirus 2: NEGATIVE

## 2020-12-09 NOTE — Anesthesia Preprocedure Evaluation (Addendum)
Anesthesia Evaluation  Patient identified by MRN, date of birth, ID band Patient awake    Reviewed: Allergy & Precautions, H&P , NPO status , Patient's Chart, lab work & pertinent test results  Airway Mallampati: II  TM Distance: >3 FB Neck ROM: Full    Dental no notable dental hx. (+) Edentulous Upper, Edentulous Lower   Pulmonary neg pulmonary ROS, sleep apnea and Continuous Positive Airway Pressure Ventilation ,    Pulmonary exam normal breath sounds clear to auscultation       Cardiovascular Exercise Tolerance: Good hypertension, Pt. on medications and Pt. on home beta blockers +CHF  negative cardio ROS Normal cardiovascular exam Rhythm:Regular Rate:Normal     Neuro/Psych PSYCHIATRIC DISORDERS Dementia TIAnegative neurological ROS  negative psych ROS   GI/Hepatic negative GI ROS, Neg liver ROS,   Endo/Other  negative endocrine ROSdiabetes, Type 1, Insulin Dependent  Renal/GU Dialysis and CRFRenal diseasenegative Renal ROS  negative genitourinary   Musculoskeletal negative musculoskeletal ROS (+) Arthritis , Osteoarthritis,    Abdominal   Peds negative pediatric ROS (+)  Hematology negative hematology ROS (+) Blood dyscrasia, anemia ,   Anesthesia Other Findings   Reproductive/Obstetrics negative OB ROS                           Anesthesia Physical Anesthesia Plan  ASA: IV  Anesthesia Plan: General   Post-op Pain Management:    Induction: Intravenous  PONV Risk Score and Plan: 2 and Ondansetron and Treatment may vary due to age or medical condition  Airway Management Planned: LMA and Oral ETT  Additional Equipment: None  Intra-op Plan:   Post-operative Plan:   Informed Consent: I have reviewed the patients History and Physical, chart, labs and discussed the procedure including the risks, benefits and alternatives for the proposed anesthesia with the patient or authorized  representative who has indicated his/her understanding and acceptance.       Plan Discussed with: Anesthesiologist and CRNA  Anesthesia Plan Comments: (PAT note by Karoline Caldwell, PA-C:  Recent prolonged admission 07/25/2020 through 08/14/2020 for acute hypoxic respiratory failure secondary to sepsis, COVID-pneumonia, suspected bacterial superinfection.  This was complicated by progression of CKD 4 to ESRD on HD.  She also had an episode of acute atrial fibrillation with RVR, seen by cardiology, recommended continue apixaban, metoprolol.  She was briefly on amiodarone but this was discontinued during hospitalization.  She did convert back to sinus rhythm.  Echo 07/30/2020 showed EF 55 to 60%, normal wall motion, grade 1 DD, normal valves.  Patient also noted to have new thrombocytopenia during admission.  Stabilized prior to discharge.  Last CBC 08/14/2020 showed platelets 85.  IDDM 2, last A1c 8.0 on 07/29/2020.  Currently dialyzing through a right IJ [facility] on Monday Wednesday Friday.  Patient will need day of surgery labs and evaluation.  EKG 07/25/2020: Sinus tachycardia.  Rate 109.  LVH.  Nonspecific T abnormalities, lateral leads.  TTE 07/30/2020: 1. Left ventricular ejection fraction, by estimation, is 55 to 60%. The  left ventricle has normal function. The left ventricle has no regional  wall motion abnormalities. There is mild concentric left ventricular  hypertrophy. Left ventricular diastolic  parameters are consistent with Grade I diastolic dysfunction (impaired  relaxation). Elevated left ventricular end-diastolic pressure.  2. Right ventricular systolic function is normal. The right ventricular  size is normal. There is mildly elevated pulmonary artery systolic  pressure.  3. The mitral valve is normal in structure. Trivial  mitral valve  regurgitation. No evidence of mitral stenosis.  4. The aortic valve is normal in structure. Aortic valve regurgitation is  not visualized.  No aortic stenosis is present.  5. The inferior vena cava is normal in size with greater than 50%  respiratory variability, suggesting right atrial pressure of 3 mmHg.   )     Anesthesia Quick Evaluation

## 2020-12-09 NOTE — Progress Notes (Signed)
Spoke with daughter [name] [name] [phone] for PAT information.  [name] [name] states patient does not have any shortness of breath, fever, cough or chest pain.  PCP -Dr [name]  Cardiologist - n/a  Chest x-ray - 08/01/20 (1V) EKG - 07/27/20 Stress Test - n/a ECHO - 07/30/20 Cardiac Cath - n/a  Sleep Study -  Yes CPAP - does not use cpap  Fasting Blood Sugar - 100s Checks Blood Sugar 2-3 times a day  . THE NIGHT BEFORE SURGERY, take 4 Units of Levemir Insulin.     . If your blood sugar is less than 70 mg/dL, on day of surgery, you will need to treat for low blood sugar: o Treat a low blood sugar (less than 70 mg/dL) with  cup of clear juice (cranberry or apple), 4 glucose tablets, OR glucose gel. o Recheck blood sugar in 15 minutes after treatment (to make sure it is greater than 70 mg/dL). If your blood sugar is not greater than 70 mg/dL on recheck, call [phone] for further instructions.  Blood Thinner Instructions:  Last dose of Eliquis was on 12/06/20  Anesthesia review: Yes  STOP now taking any Aspirin (unless otherwise instructed by your surgeon), CAleve, Naproxen, Ibuprofen, Motrin, Advil, Goody's, BC's, all herbal medications, fish oil, and all vitamins.   Coronavirus Screening Covid test on 12/08/20 was negative.  Daughter [name] [name] verbalized understanding of instructions that were given via phone.

## 2020-12-09 NOTE — Progress Notes (Signed)
Anesthesia Chart Review: Same day workup  Recent prolonged admission 07/25/2020 through 08/14/2020 for acute hypoxic respiratory failure secondary to sepsis, COVID-pneumonia, suspected bacterial superinfection.  This was complicated by progression of CKD 4 to ESRD on HD.  She also had an episode of acute atrial fibrillation with RVR, seen by cardiology, recommended continue apixaban, metoprolol.  She was briefly on amiodarone but this was discontinued during hospitalization.  She did convert back to sinus rhythm.  Echo 07/30/2020 showed EF 55 to 60%, normal wall motion, grade 1 DD, normal valves.  Patient also noted to have new thrombocytopenia during admission.  Stabilized prior to discharge.  Last CBC 08/14/2020 showed platelets 85.  IDDM 2, last A1c 8.0 on 07/29/2020.  Currently dialyzing through a right IJ [facility] on Monday Wednesday Friday.  Patient will need day of surgery labs and evaluation.  EKG 07/25/2020: Sinus tachycardia.  Rate 109.  LVH.  Nonspecific T abnormalities, lateral leads.  TTE 07/30/2020: 1. Left ventricular ejection fraction, by estimation, is 55 to 60%. The  left ventricle has normal function. The left ventricle has no regional  wall motion abnormalities. There is mild concentric left ventricular  hypertrophy. Left ventricular diastolic  parameters are consistent with Grade I diastolic dysfunction (impaired  relaxation). Elevated left ventricular end-diastolic pressure.  2. Right ventricular systolic function is normal. The right ventricular  size is normal. There is mildly elevated pulmonary artery systolic  pressure.  3. The mitral valve is normal in structure. Trivial mitral valve  regurgitation. No evidence of mitral stenosis.  4. The aortic valve is normal in structure. Aortic valve regurgitation is  not visualized. No aortic stenosis is present.  5. The inferior vena cava is normal in size with greater than 50%  respiratory variability, suggesting right  atrial pressure of 3 mmHg.    Wynonia Musty [facility] Stay [facility]/Anesthesiology Phone [phone] 12/09/2020 2:45 PM

## 2020-12-10 ENCOUNTER — Ambulatory Visit (HOSPITAL_COMMUNITY)
Admission: RE | Admit: 2020-12-10 | Discharge: 2020-12-10 | Disposition: A | Discharge status: Home / Self Care | Payer: Medicare HMO | Attending: Surgery | Admitting: Surgery

## 2020-12-10 ENCOUNTER — Encounter (HOSPITAL_COMMUNITY): Payer: Self-pay | Admitting: Surgery

## 2020-12-10 ENCOUNTER — Ambulatory Visit (HOSPITAL_COMMUNITY): Payer: Medicare HMO | Admitting: Physician Assistant

## 2020-12-10 ENCOUNTER — Encounter (HOSPITAL_COMMUNITY)
Admission: RE | Disposition: A | Discharge status: Home / Self Care | Payer: Self-pay | Source: Home / Self Care | Attending: Surgery

## 2020-12-10 DIAGNOSIS — N186 End stage renal disease: Secondary | ICD-10-CM | POA: Diagnosis not present

## 2020-12-10 DIAGNOSIS — Z880 Allergy status to penicillin: Secondary | ICD-10-CM | POA: Diagnosis not present

## 2020-12-10 DIAGNOSIS — Z7901 Long term (current) use of anticoagulants: Secondary | ICD-10-CM | POA: Diagnosis not present

## 2020-12-10 DIAGNOSIS — Z794 Long term (current) use of insulin: Secondary | ICD-10-CM | POA: Diagnosis not present

## 2020-12-10 DIAGNOSIS — I509 Heart failure, unspecified: Secondary | ICD-10-CM | POA: Insufficient documentation

## 2020-12-10 DIAGNOSIS — N185 Chronic kidney disease, stage 5: Secondary | ICD-10-CM

## 2020-12-10 DIAGNOSIS — Z79899 Other long term (current) drug therapy: Secondary | ICD-10-CM | POA: Diagnosis not present

## 2020-12-10 DIAGNOSIS — E1122 Type 2 diabetes mellitus with diabetic chronic kidney disease: Secondary | ICD-10-CM | POA: Diagnosis not present

## 2020-12-10 DIAGNOSIS — I5032 Chronic diastolic (congestive) heart failure: Secondary | ICD-10-CM | POA: Diagnosis not present

## 2020-12-10 DIAGNOSIS — I132 Hypertensive heart and chronic kidney disease with heart failure and with stage 5 chronic kidney disease, or end stage renal disease: Secondary | ICD-10-CM | POA: Diagnosis not present

## 2020-12-10 DIAGNOSIS — Z992 Dependence on renal dialysis: Secondary | ICD-10-CM | POA: Insufficient documentation

## 2020-12-10 HISTORY — PX: AV FISTULA PLACEMENT: SHX1204

## 2020-12-10 HISTORY — DX: Other seasonal allergic rhinitis: J30.2

## 2020-12-10 HISTORY — DX: Unspecified dementia, unspecified severity, without behavioral disturbance, psychotic disturbance, mood disturbance, and anxiety: F03.90

## 2020-12-10 LAB — POCT I-STAT, CHEM 8
BUN: 16 mg/dL (ref 8–23)
Calcium, Ion: 1.15 mmol/L (ref 1.15–1.40)
Chloride: 94 mmol/L — ABNORMAL LOW (ref 98–111)
Creatinine, Ser: 4.2 mg/dL — ABNORMAL HIGH (ref 0.44–1.00)
Glucose, Bld: 162 mg/dL — ABNORMAL HIGH (ref 70–99)
HCT: 41 % (ref 36.0–46.0)
Hemoglobin: 13.9 g/dL (ref 12.0–15.0)
Potassium: 4 mmol/L (ref 3.5–5.1)
Sodium: 134 mmol/L — ABNORMAL LOW (ref 135–145)
TCO2: 30 mmol/L (ref 22–32)

## 2020-12-10 LAB — GLUCOSE, CAPILLARY
Glucose-Capillary: 148 mg/dL — ABNORMAL HIGH (ref 70–99)
Glucose-Capillary: 187 mg/dL — ABNORMAL HIGH (ref 70–99)

## 2020-12-10 SURGERY — INSERTION OF ARTERIOVENOUS (AV) GORE-TEX GRAFT ARM
Anesthesia: General | Site: Arm Upper | Laterality: Left

## 2020-12-10 MED ORDER — PROPOFOL 10 MG/ML IV BOLUS
INTRAVENOUS | Status: AC
  Filled 2020-12-10: qty 20

## 2020-12-10 MED ORDER — ACETAMINOPHEN 160 MG/5ML PO SOLN: 325.0000 mg | ORAL | Status: DC | PRN

## 2020-12-10 MED ORDER — ACETAMINOPHEN 325 MG PO TABS: 325.0000 mg | ORAL_TABLET | ORAL | Status: DC | PRN

## 2020-12-10 MED ORDER — SODIUM CHLORIDE 0.9 % IV SOLN
INTRAVENOUS | Status: DC | PRN
  Administered 2020-12-10: 500 mL

## 2020-12-10 MED ORDER — LIDOCAINE 2% (20 MG/ML) 5 ML SYRINGE
INTRAMUSCULAR | Status: DC | PRN
  Administered 2020-12-10: 100 mg via INTRAVENOUS

## 2020-12-10 MED ORDER — MEPERIDINE HCL 25 MG/ML IJ SOLN: 6.2500 mg | INTRAMUSCULAR | Status: DC | PRN

## 2020-12-10 MED ORDER — HEPARIN SODIUM (PORCINE) 1000 UNIT/ML IJ SOLN
INTRAMUSCULAR | Status: AC
  Filled 2020-12-10: qty 1

## 2020-12-10 MED ORDER — DEXAMETHASONE SODIUM PHOSPHATE 10 MG/ML IJ SOLN
INTRAMUSCULAR | Status: AC
  Filled 2020-12-10: qty 1

## 2020-12-10 MED ORDER — HEPARIN SODIUM (PORCINE) 1000 UNIT/ML IJ SOLN
INTRAMUSCULAR | Status: DC | PRN
  Administered 2020-12-10: 300 [IU] via INTRAVENOUS

## 2020-12-10 MED ORDER — ONDANSETRON HCL 4 MG/2ML IJ SOLN: 4.0000 mg | Freq: Once | INTRAMUSCULAR | Status: DC | PRN

## 2020-12-10 MED ORDER — 0.9 % SODIUM CHLORIDE (POUR BTL) OPTIME
TOPICAL | Status: DC | PRN
  Administered 2020-12-10: 1000 mL

## 2020-12-10 MED ORDER — HEMOSTATIC AGENTS (NO CHARGE) OPTIME
TOPICAL | Status: DC | PRN
  Administered 2020-12-10: 1 via TOPICAL

## 2020-12-10 MED ORDER — CHLORHEXIDINE GLUCONATE 4 % EX LIQD: 60.0000 mL | Freq: Once | CUTANEOUS | Status: DC

## 2020-12-10 MED ORDER — FENTANYL CITRATE (PF) 250 MCG/5ML IJ SOLN
INTRAMUSCULAR | Status: DC | PRN
  Administered 2020-12-10: 50 ug via INTRAVENOUS

## 2020-12-10 MED ORDER — VANCOMYCIN HCL IN DEXTROSE 1-5 GM/200ML-% IV SOLN
1000.0000 mg | INTRAVENOUS | Status: AC
  Administered 2020-12-10: 1000 mg via INTRAVENOUS
  Filled 2020-12-10: qty 200

## 2020-12-10 MED ORDER — PROPOFOL 10 MG/ML IV BOLUS
INTRAVENOUS | Status: DC | PRN
  Administered 2020-12-10: 120 mg via INTRAVENOUS

## 2020-12-10 MED ORDER — OXYCODONE-ACETAMINOPHEN 10-325 MG PO TABS: 1.0000 | ORAL_TABLET | Freq: Four times a day (QID) | ORAL | 0 refills | Status: DC | PRN

## 2020-12-10 MED ORDER — CHLORHEXIDINE GLUCONATE 0.12 % MT SOLN: 15.0000 mL | Freq: Once | OROMUCOSAL | Status: AC

## 2020-12-10 MED ORDER — PHENYLEPHRINE HCL-NACL 10-0.9 MG/250ML-% IV SOLN
INTRAVENOUS | Status: DC | PRN
  Administered 2020-12-10: 40 ug/min via INTRAVENOUS

## 2020-12-10 MED ORDER — CHLORHEXIDINE GLUCONATE 0.12 % MT SOLN
OROMUCOSAL | Status: AC
  Administered 2020-12-10: 15 mL via OROMUCOSAL
  Filled 2020-12-10: qty 15

## 2020-12-10 MED ORDER — FENTANYL CITRATE (PF) 100 MCG/2ML IJ SOLN: 25.0000 ug | INTRAMUSCULAR | Status: DC | PRN

## 2020-12-10 MED ORDER — LIDOCAINE-EPINEPHRINE 1 %-1:100000 IJ SOLN
INTRAMUSCULAR | Status: AC
  Filled 2020-12-10: qty 1

## 2020-12-10 MED ORDER — OXYCODONE HCL 5 MG PO TABS: 5.0000 mg | ORAL_TABLET | Freq: Once | ORAL | Status: DC | PRN

## 2020-12-10 MED ORDER — ORAL CARE MOUTH RINSE: 15.0000 mL | Freq: Once | OROMUCOSAL | Status: AC

## 2020-12-10 MED ORDER — FENTANYL CITRATE (PF) 250 MCG/5ML IJ SOLN
INTRAMUSCULAR | Status: AC
  Filled 2020-12-10: qty 5

## 2020-12-10 MED ORDER — SODIUM CHLORIDE 0.9 % IV SOLN: INTRAVENOUS | Status: DC

## 2020-12-10 MED ORDER — ONDANSETRON HCL 4 MG/2ML IJ SOLN
INTRAMUSCULAR | Status: AC
  Filled 2020-12-10: qty 2

## 2020-12-10 MED ORDER — LIDOCAINE 2% (20 MG/ML) 5 ML SYRINGE
INTRAMUSCULAR | Status: AC
  Filled 2020-12-10: qty 5

## 2020-12-10 MED ORDER — PROTAMINE SULFATE 10 MG/ML IV SOLN
INTRAVENOUS | Status: AC
  Filled 2020-12-10: qty 5

## 2020-12-10 MED ORDER — PROTAMINE SULFATE 10 MG/ML IV SOLN
INTRAVENOUS | Status: DC | PRN
  Administered 2020-12-10: 25 mg via INTRAVENOUS

## 2020-12-10 MED ORDER — OXYCODONE HCL 5 MG/5ML PO SOLN
5.0000 mg | Freq: Once | ORAL | Status: DC | PRN
Start: 2020-12-10 — End: 2020-12-10

## 2020-12-10 MED ORDER — ONDANSETRON HCL 4 MG/2ML IJ SOLN
INTRAMUSCULAR | Status: DC | PRN
  Administered 2020-12-10: 4 mg via INTRAVENOUS

## 2020-12-10 MED ORDER — DEXAMETHASONE SODIUM PHOSPHATE 10 MG/ML IJ SOLN
INTRAMUSCULAR | Status: DC | PRN
  Administered 2020-12-10: 5 mg via INTRAVENOUS

## 2020-12-10 MED ORDER — LACTATED RINGERS IV SOLN: INTRAVENOUS | Status: DC

## 2020-12-10 MED ORDER — SODIUM CHLORIDE 0.9 % IV SOLN
INTRAVENOUS | Status: AC
  Filled 2020-12-10: qty 1.2

## 2020-12-10 SURGICAL SUPPLY — 34 items
ARMBAND PINK RESTRICT EXTREMIT (MISCELLANEOUS) ×4 IMPLANT
CANISTER SUCT 3000ML PPV (MISCELLANEOUS) ×2 IMPLANT
CLIP VESOCCLUDE MED 6/CT (CLIP) ×2 IMPLANT
CLIP VESOCCLUDE SM WIDE 6/CT (CLIP) ×2 IMPLANT
COVER WAND RF STERILE (DRAPES) ×2 IMPLANT
DERMABOND ADVANCED (GAUZE/BANDAGES/DRESSINGS) ×1
DERMABOND ADVANCED .7 DNX12 (GAUZE/BANDAGES/DRESSINGS) ×1 IMPLANT
ELECT REM PT RETURN 9FT ADLT (ELECTROSURGICAL) ×2 IMPLANT
ELECTRODE REM PT RTRN 9FT ADLT (ELECTROSURGICAL) ×1 IMPLANT
GLOVE BIOGEL PI IND STRL 7.5 (GLOVE) ×1 IMPLANT
GLOVE BIOGEL PI INDICATOR 7.5 (GLOVE) ×1
GLOVE SURG SS PI 7.5 STRL IVOR (GLOVE) ×2 IMPLANT
GLOVE SURG SYN 6.5 ES PF (GLOVE) ×8 IMPLANT
GOWN STRL REUS W/ TWL LRG LVL3 (GOWN DISPOSABLE) ×2 IMPLANT
GOWN STRL REUS W/ TWL XL LVL3 (GOWN DISPOSABLE) ×1 IMPLANT
GOWN STRL REUS W/TWL LRG LVL3 (GOWN DISPOSABLE) ×4
GOWN STRL REUS W/TWL XL LVL3 (GOWN DISPOSABLE) ×2
GRAFT GORETEX STRT 4-7X45 (Vascular Products) ×2 IMPLANT
HEMOSTAT SNOW SURGICEL 2X4 (HEMOSTASIS) IMPLANT
KIT BASIN OR (CUSTOM PROCEDURE TRAY) ×2 IMPLANT
KIT TURNOVER KIT B (KITS) ×2 IMPLANT
NS IRRIG 1000ML POUR BTL (IV SOLUTION) ×2 IMPLANT
PACK CV ACCESS (CUSTOM PROCEDURE TRAY) ×2 IMPLANT
PAD ARMBOARD 7.5X6 YLW CONV (MISCELLANEOUS) ×4 IMPLANT
SURGIFLO W/THROMBIN 8M KIT (HEMOSTASIS) ×2 IMPLANT
SUT PROLENE 6 0 BV (SUTURE) ×4 IMPLANT
SUT PROLENE 6 0 CC (SUTURE) ×2 IMPLANT
SUT VIC AB 3-0 SH 27 (SUTURE) ×8
SUT VIC AB 3-0 SH 27X BRD (SUTURE) ×4 IMPLANT
SUT VICRYL 4-0 PS2 18IN ABS (SUTURE) IMPLANT
SYR TOOMEY 50ML (SYRINGE) IMPLANT
TOWEL GREEN STERILE (TOWEL DISPOSABLE) ×2 IMPLANT
UNDERPAD 30X36 HEAVY ABSORB (UNDERPADS AND DIAPERS) ×2 IMPLANT
WATER STERILE IRR 1000ML POUR (IV SOLUTION) ×2 IMPLANT

## 2020-12-10 NOTE — Interval H&P Note (Signed)
History and Physical Interval Note:  12/10/2020 7:23 AM  [name] [name]  has presented today for surgery, with the diagnosis of ESRD.  The various methods of treatment have been discussed with the patient and family. After consideration of risks, benefits and other options for treatment, the patient has consented to  Procedure(s): INSERTION OF ARTERIOVENOUS (AV) GORE-TEX GRAFT LEFT ARM (Left) as a surgical intervention.  The patient's history has been reviewed, patient examined, no change in status, stable for surgery.  I have reviewed the patient's chart and labs.  Questions were answered to the patient's satisfaction.     Annamarie Major

## 2020-12-10 NOTE — Op Note (Signed)
    Patient name: [name] [name] MRN: [id number] DOB: [birth year] Sex: female  12/10/2020 Pre-operative Diagnosis: ESRD Post-operative diagnosis:  Same Surgeon:  Annamarie Major Assistants:  Risa Grill Procedure:   Left upper arm dialysis graft (4x7) Anesthesia: General Blood Loss: Minimal Specimens: None  Findings: 3 to 4 mm artery, venous anastomosis was to the brachial vein, end to side.  This was a 4 mm vein  Indications: The patient comes in for new dialysis access  Procedure:  The patient was identified in the holding area and taken to [place] [facility]  The patient was then placed supine on the table. general anesthesia was administered.  The patient was prepped and draped in the usual sterile fashion.  A time out was called and antibiotics were administered.  A PA was necessary to expedite the procedure and assist with technical details.  Ultrasound was used to evaluate the surface veins in the left arm.  She did not have a usable vein for fistula creation.  I initially began by making a longitudinal incision just above the antecubital crease.  Through this incision I dissected out the brachial artery which was a 3 to 4 mm disease-free artery.  I then made a second longitudinal incision up near the axilla.  The brachial vein was dissected out at this level.  It was a 4 mm vein.  Next a curved Gore tunneler was used to make a tunnel between the 2 incisions.  A 4 x 7 graft was brought through the tunnel.  The brachial artery was then occluded.  A #11 blade was used to make an arteriotomy which was extended longitudinally.  The graft was beveled to fit the size the arteriotomy and a running anastomosis was created with 6-0 Prolene.  The clamps were released.  I was not happy with the flow through the graft.  I therefore elected to reclamped the brachial artery and takedown the anastomosis.  The artery was inspected.  There was no thrombus or injury to the artery.  I gave the patient 3000  units of heparin.  I then redid the anastomosis in a end-to-side fashion with a 6-0 Prolene.  Once the clamps were released, this time there was excellent flow through the graft.  The graft was then flushed with heparin saline and reoccluded.  Attention was then turned towards the brachial vein which was occluded with vascular clamps.  A #11 blade was used to make an arteriotomy which was extended longitudinally with Potts scissors.  The graft was beveled to fit the size of the venotomy and a end-to-side anastomosis was performed with 6-0 Prolene.  Appropriate flushing maneuvers were performed.  The clamps were released.  There was an excellent thrill within the graft and a palpable radial pulse.  25 mg of protamine were given.  Hemostasis was achieved.  The incisions were closed with 2 layers of 3-0 Vicryl, followed by Dermabond.  The patient was successfully extubated and taken recovery room in stable condition.   Disposition: To PACU stable.   Theotis Burrow, M.D., [facility] [place] Office: [phone] Pager:  [phone]

## 2020-12-10 NOTE — Anesthesia Postprocedure Evaluation (Signed)
Anesthesia Post Note  Patient: Bena Kobel [place]  Procedure(s) Performed: INSERTION OF ARTERIOVENOUS (AV) GORE-TEX GRAFT LEFT ARM (Left Arm Upper)     Patient location during evaluation: PACU Anesthesia Type: General Level of consciousness: awake and alert Pain management: pain level controlled Vital Signs Assessment: post-procedure vital signs reviewed and stable Respiratory status: spontaneous breathing, nonlabored ventilation, respiratory function stable and patient connected to nasal cannula oxygen Cardiovascular status: blood pressure returned to baseline and stable Postop Assessment: no apparent nausea or vomiting Anesthetic complications: no   No complications documented.  Last Vitals:  Vitals:   12/10/20 0947 12/10/20 1002  BP: (!) 112/58 (!) 100/55  Pulse: 74 72  Resp: 16 (!) 30  Temp:  36.7 C  SpO2: 93% 93%    Last Pain:  Vitals:   12/10/20 1002  PainSc: 0-No pain                 Kalan Yeley

## 2020-12-10 NOTE — Anesthesia Procedure Notes (Signed)
Procedure Name: LMA Insertion Date/Time: 12/10/2020 7:40 AM Performed by: Reece Agar, CRNA Pre-anesthesia Checklist: Patient identified, Emergency Drugs available, Suction available and Patient being monitored Patient Re-evaluated:Patient Re-evaluated prior to induction Oxygen Delivery Method: Circle System Utilized Preoxygenation: Pre-oxygenation with 100% oxygen Induction Type: IV induction Ventilation: Mask ventilation without difficulty LMA: LMA inserted LMA Size: 4.0 Number of attempts: 1 Airway Equipment and Method: Bite block Placement Confirmation: positive ETCO2 Tube secured with: Tape Dental Injury: Teeth and Oropharynx as per pre-operative assessment

## 2020-12-10 NOTE — Discharge Instructions (Addendum)
° °  [facility] [place] [facility] ° °Discharge Instructions ° °AV Fistula or Graft Surgery for Dialysis Access ° °Please refer to the following instructions for your post-procedure care. Your surgeon or physician assistant will discuss any changes with you. ° °Activity ° °You may drive the day following your surgery, if you are comfortable and no longer taking prescription pain medication. Resume full activity as the soreness in your incision resolves. ° °Bathing/Showering ° °You may shower after you go home. Keep your incision dry for 48 hours. Do not soak in a bathtub, hot tub, or swim until the incision heals completely. You may not shower if you have a hemodialysis catheter. ° °Incision Care ° °Clean your incision with mild soap and water after 48 hours. Pat the area dry with a clean towel. You do not need a bandage unless otherwise instructed. Do not apply any ointments or creams to your incision. You may have skin glue on your incision. Do not peel it off. It will come off on its own in about one week. Your arm may swell a bit after surgery. To reduce swelling use pillows to elevate your arm so it is above your heart. Your doctor will tell you if you need to lightly wrap your arm with an ACE bandage. ° °Diet ° °Resume your normal diet. There are not special food restrictions following this procedure. In order to heal from your surgery, it is CRITICAL to get adequate nutrition. Your body requires vitamins, minerals, and protein. Vegetables are the best source of vitamins and minerals. Vegetables also provide the perfect balance of protein. Processed food has little nutritional value, so try to avoid this. ° °Medications ° °Resume taking all of your medications. If your incision is causing pain, you may take over-the counter pain relievers such as acetaminophen (Tylenol). If you were prescribed a stronger pain medication, please be aware these medications can cause nausea and constipation. Prevent  nausea by taking the medication with a snack or meal. Avoid constipation by drinking plenty of fluids and eating foods with high amount of fiber, such as fruits, vegetables, and grains. Do not take Tylenol if you are taking prescription pain medications. ° ° ° ° °Follow up °Your surgeon may want to see you in the office following your access surgery. If so, this will be arranged at the time of your surgery. ° °Please call us immediately for any of the following conditions: ° °Increased pain, redness, drainage (pus) from your incision site °Fever of 101 degrees or higher °Severe or worsening pain at your incision site °Hand pain or numbness. ° °Reduce your risk of vascular disease: ° °Stop smoking. If you would like help, call [facility] at 1-800-QUIT-NOW (1-800-784-8669) or [place] at 336-586-4000 ° °Manage your cholesterol °Maintain a desired weight °Control your diabetes °Keep your blood pressure down ° °Dialysis ° °It will take several weeks to several months for your new dialysis access to be ready for use. Your surgeon will determine when it is OK to use it. Your nephrologist will continue to direct your dialysis. You can continue to use your Permcath until your new access is ready for use. ° °If you have any questions, please call the office at [phone]. ° °

## 2020-12-10 NOTE — Transfer of Care (Addendum)
Immediate Anesthesia Transfer of Care Note  Patient: [name] [name]  Procedure(s) Performed: INSERTION OF ARTERIOVENOUS (AV) GORE-TEX GRAFT LEFT ARM (Left Arm Upper)  Patient Location: PACU  Anesthesia Type:General  Level of Consciousness: drowsy  Airway & Oxygen Therapy: Patient Spontanous Breathing  Post-op Assessment: Report given to RN and Post -op Vital signs reviewed and stable  Post vital signs: Reviewed and stable  Last Vitals:  Vitals Value Taken Time  BP 108/54 12/10/20 0932  Temp    Pulse 73 12/10/20 0933  Resp 21 12/10/20 0933  SpO2 96 % 12/10/20 0933  Vitals shown include unvalidated device data.  Last Pain:  Vitals:   12/10/20 0644  PainSc: 0-No pain         Complications: No complications documented.

## 2020-12-11 ENCOUNTER — Encounter (HOSPITAL_COMMUNITY): Payer: Self-pay | Admitting: Surgery

## 2020-12-11 DIAGNOSIS — N186 End stage renal disease: Secondary | ICD-10-CM | POA: Diagnosis not present

## 2020-12-11 DIAGNOSIS — N2581 Secondary hyperparathyroidism of renal origin: Secondary | ICD-10-CM | POA: Diagnosis not present

## 2020-12-11 DIAGNOSIS — T8249XS Other complication of vascular dialysis catheter, sequela: Secondary | ICD-10-CM | POA: Diagnosis not present

## 2020-12-11 DIAGNOSIS — D509 Iron deficiency anemia, unspecified: Secondary | ICD-10-CM | POA: Diagnosis not present

## 2020-12-11 DIAGNOSIS — Z992 Dependence on renal dialysis: Secondary | ICD-10-CM | POA: Diagnosis not present

## 2020-12-12 DIAGNOSIS — U071 COVID-19: Secondary | ICD-10-CM | POA: Diagnosis not present

## 2020-12-14 DIAGNOSIS — Z992 Dependence on renal dialysis: Secondary | ICD-10-CM | POA: Diagnosis not present

## 2020-12-14 DIAGNOSIS — D509 Iron deficiency anemia, unspecified: Secondary | ICD-10-CM | POA: Diagnosis not present

## 2020-12-14 DIAGNOSIS — T8249XS Other complication of vascular dialysis catheter, sequela: Secondary | ICD-10-CM | POA: Diagnosis not present

## 2020-12-14 DIAGNOSIS — N2581 Secondary hyperparathyroidism of renal origin: Secondary | ICD-10-CM | POA: Diagnosis not present

## 2020-12-14 DIAGNOSIS — N186 End stage renal disease: Secondary | ICD-10-CM | POA: Diagnosis not present

## 2020-12-16 DIAGNOSIS — Z992 Dependence on renal dialysis: Secondary | ICD-10-CM | POA: Diagnosis not present

## 2020-12-16 DIAGNOSIS — N2581 Secondary hyperparathyroidism of renal origin: Secondary | ICD-10-CM | POA: Diagnosis not present

## 2020-12-16 DIAGNOSIS — D509 Iron deficiency anemia, unspecified: Secondary | ICD-10-CM | POA: Diagnosis not present

## 2020-12-16 DIAGNOSIS — T8249XS Other complication of vascular dialysis catheter, sequela: Secondary | ICD-10-CM | POA: Diagnosis not present

## 2020-12-16 DIAGNOSIS — N186 End stage renal disease: Secondary | ICD-10-CM | POA: Diagnosis not present

## 2020-12-18 DIAGNOSIS — Z992 Dependence on renal dialysis: Secondary | ICD-10-CM | POA: Diagnosis not present

## 2020-12-18 DIAGNOSIS — T8249XS Other complication of vascular dialysis catheter, sequela: Secondary | ICD-10-CM | POA: Diagnosis not present

## 2020-12-18 DIAGNOSIS — N2581 Secondary hyperparathyroidism of renal origin: Secondary | ICD-10-CM | POA: Diagnosis not present

## 2020-12-18 DIAGNOSIS — D509 Iron deficiency anemia, unspecified: Secondary | ICD-10-CM | POA: Diagnosis not present

## 2020-12-18 DIAGNOSIS — N186 End stage renal disease: Secondary | ICD-10-CM | POA: Diagnosis not present

## 2020-12-21 DIAGNOSIS — N2581 Secondary hyperparathyroidism of renal origin: Secondary | ICD-10-CM | POA: Diagnosis not present

## 2020-12-21 DIAGNOSIS — Z992 Dependence on renal dialysis: Secondary | ICD-10-CM | POA: Diagnosis not present

## 2020-12-21 DIAGNOSIS — N186 End stage renal disease: Secondary | ICD-10-CM | POA: Diagnosis not present

## 2020-12-21 DIAGNOSIS — T8249XS Other complication of vascular dialysis catheter, sequela: Secondary | ICD-10-CM | POA: Diagnosis not present

## 2020-12-21 DIAGNOSIS — E1110 Type 2 diabetes mellitus with ketoacidosis without coma: Secondary | ICD-10-CM | POA: Diagnosis not present

## 2020-12-21 DIAGNOSIS — D509 Iron deficiency anemia, unspecified: Secondary | ICD-10-CM | POA: Diagnosis not present

## 2020-12-22 ENCOUNTER — Telehealth: Payer: Medicare HMO

## 2020-12-22 ENCOUNTER — Telehealth: Payer: Self-pay

## 2020-12-22 NOTE — Telephone Encounter (Signed)
  Care Management   Follow Up Note   12/22/2020 Name: [name] [name] MRN: [id number] DOB: 1936-05-25   Referred by: [name], MD Reason for referral : Chronic Care Management (RNCM Initial Call Attempt)   An unsuccessful telephone outreach was attempted today. The patient was referred to the case management team for assistance with care management and care coordination.   Follow Up Plan: A HIPPA compliant phone message was left for the patient providing contact information and requesting a return call.   Barb Merino, RN, BSN, CCM Care Management Coordinator [place] [facility]  Direct Phone: [phone]

## 2020-12-23 DIAGNOSIS — E1110 Type 2 diabetes mellitus with ketoacidosis without coma: Secondary | ICD-10-CM | POA: Diagnosis not present

## 2020-12-23 DIAGNOSIS — Z992 Dependence on renal dialysis: Secondary | ICD-10-CM | POA: Diagnosis not present

## 2020-12-23 DIAGNOSIS — N186 End stage renal disease: Secondary | ICD-10-CM | POA: Diagnosis not present

## 2020-12-23 DIAGNOSIS — T8249XS Other complication of vascular dialysis catheter, sequela: Secondary | ICD-10-CM | POA: Diagnosis not present

## 2020-12-23 DIAGNOSIS — N2581 Secondary hyperparathyroidism of renal origin: Secondary | ICD-10-CM | POA: Diagnosis not present

## 2020-12-23 DIAGNOSIS — D509 Iron deficiency anemia, unspecified: Secondary | ICD-10-CM | POA: Diagnosis not present

## 2020-12-24 DIAGNOSIS — H5213 Myopia, bilateral: Secondary | ICD-10-CM | POA: Diagnosis not present

## 2020-12-24 DIAGNOSIS — H524 Presbyopia: Secondary | ICD-10-CM | POA: Diagnosis not present

## 2020-12-24 DIAGNOSIS — H52209 Unspecified astigmatism, unspecified eye: Secondary | ICD-10-CM | POA: Diagnosis not present

## 2020-12-25 DIAGNOSIS — N2581 Secondary hyperparathyroidism of renal origin: Secondary | ICD-10-CM | POA: Diagnosis not present

## 2020-12-25 DIAGNOSIS — N186 End stage renal disease: Secondary | ICD-10-CM | POA: Diagnosis not present

## 2020-12-25 DIAGNOSIS — D509 Iron deficiency anemia, unspecified: Secondary | ICD-10-CM | POA: Diagnosis not present

## 2020-12-25 DIAGNOSIS — E1110 Type 2 diabetes mellitus with ketoacidosis without coma: Secondary | ICD-10-CM | POA: Diagnosis not present

## 2020-12-25 DIAGNOSIS — Z992 Dependence on renal dialysis: Secondary | ICD-10-CM | POA: Diagnosis not present

## 2020-12-25 DIAGNOSIS — T8249XS Other complication of vascular dialysis catheter, sequela: Secondary | ICD-10-CM | POA: Diagnosis not present

## 2020-12-28 DIAGNOSIS — N2581 Secondary hyperparathyroidism of renal origin: Secondary | ICD-10-CM | POA: Diagnosis not present

## 2020-12-28 DIAGNOSIS — Z992 Dependence on renal dialysis: Secondary | ICD-10-CM | POA: Diagnosis not present

## 2020-12-28 DIAGNOSIS — D509 Iron deficiency anemia, unspecified: Secondary | ICD-10-CM | POA: Diagnosis not present

## 2020-12-28 DIAGNOSIS — N186 End stage renal disease: Secondary | ICD-10-CM | POA: Diagnosis not present

## 2020-12-28 DIAGNOSIS — T8249XS Other complication of vascular dialysis catheter, sequela: Secondary | ICD-10-CM | POA: Diagnosis not present

## 2020-12-29 ENCOUNTER — Telehealth: Payer: Medicare HMO

## 2020-12-29 ENCOUNTER — Telehealth: Payer: Self-pay

## 2020-12-29 NOTE — Telephone Encounter (Signed)
  Care Management   Follow Up Note   12/29/2020 Name: [name] [name] MRN: [id number] DOB: Jul 04, 1936   Referred by: [name], MD Reason for referral : Chronic Care Management   An unsuccessful telephone outreach was attempted today. The patient was referred to the case management team for assistance with care management and care coordination. HIPAA compliant voice message left for the patients daughter requesting a return call.  Follow Up Plan: The care management team will reach out to the patient again over the next 30 days.   Daneen Schick, BSW, CDP Social Worker, Certified Dementia Practitioner [place] / [place] Management [phone]

## 2020-12-30 DIAGNOSIS — T8249XS Other complication of vascular dialysis catheter, sequela: Secondary | ICD-10-CM | POA: Diagnosis not present

## 2020-12-30 DIAGNOSIS — D509 Iron deficiency anemia, unspecified: Secondary | ICD-10-CM | POA: Diagnosis not present

## 2020-12-30 DIAGNOSIS — N186 End stage renal disease: Secondary | ICD-10-CM | POA: Diagnosis not present

## 2020-12-30 DIAGNOSIS — Z992 Dependence on renal dialysis: Secondary | ICD-10-CM | POA: Diagnosis not present

## 2020-12-30 DIAGNOSIS — N2581 Secondary hyperparathyroidism of renal origin: Secondary | ICD-10-CM | POA: Diagnosis not present

## 2021-01-01 ENCOUNTER — Other Ambulatory Visit: Payer: Self-pay | Admitting: Nurse Practitioner

## 2021-01-01 DIAGNOSIS — N2581 Secondary hyperparathyroidism of renal origin: Secondary | ICD-10-CM | POA: Diagnosis not present

## 2021-01-01 DIAGNOSIS — N186 End stage renal disease: Secondary | ICD-10-CM | POA: Diagnosis not present

## 2021-01-01 DIAGNOSIS — T8249XS Other complication of vascular dialysis catheter, sequela: Secondary | ICD-10-CM | POA: Diagnosis not present

## 2021-01-01 DIAGNOSIS — Z992 Dependence on renal dialysis: Secondary | ICD-10-CM | POA: Diagnosis not present

## 2021-01-01 DIAGNOSIS — R413 Other amnesia: Secondary | ICD-10-CM

## 2021-01-01 DIAGNOSIS — D509 Iron deficiency anemia, unspecified: Secondary | ICD-10-CM | POA: Diagnosis not present

## 2021-01-04 DIAGNOSIS — N186 End stage renal disease: Secondary | ICD-10-CM | POA: Diagnosis not present

## 2021-01-04 DIAGNOSIS — N2581 Secondary hyperparathyroidism of renal origin: Secondary | ICD-10-CM | POA: Diagnosis not present

## 2021-01-04 DIAGNOSIS — Z992 Dependence on renal dialysis: Secondary | ICD-10-CM | POA: Diagnosis not present

## 2021-01-04 DIAGNOSIS — D509 Iron deficiency anemia, unspecified: Secondary | ICD-10-CM | POA: Diagnosis not present

## 2021-01-05 DIAGNOSIS — E1122 Type 2 diabetes mellitus with diabetic chronic kidney disease: Secondary | ICD-10-CM | POA: Diagnosis not present

## 2021-01-05 DIAGNOSIS — N186 End stage renal disease: Secondary | ICD-10-CM | POA: Diagnosis not present

## 2021-01-05 DIAGNOSIS — Z992 Dependence on renal dialysis: Secondary | ICD-10-CM | POA: Diagnosis not present

## 2021-01-06 DIAGNOSIS — N2581 Secondary hyperparathyroidism of renal origin: Secondary | ICD-10-CM | POA: Diagnosis not present

## 2021-01-06 DIAGNOSIS — N186 End stage renal disease: Secondary | ICD-10-CM | POA: Diagnosis not present

## 2021-01-06 DIAGNOSIS — T8249XS Other complication of vascular dialysis catheter, sequela: Secondary | ICD-10-CM | POA: Diagnosis not present

## 2021-01-06 DIAGNOSIS — Z992 Dependence on renal dialysis: Secondary | ICD-10-CM | POA: Diagnosis not present

## 2021-01-06 DIAGNOSIS — D509 Iron deficiency anemia, unspecified: Secondary | ICD-10-CM | POA: Diagnosis not present

## 2021-01-08 DIAGNOSIS — N186 End stage renal disease: Secondary | ICD-10-CM | POA: Diagnosis not present

## 2021-01-08 DIAGNOSIS — T8249XS Other complication of vascular dialysis catheter, sequela: Secondary | ICD-10-CM | POA: Diagnosis not present

## 2021-01-08 DIAGNOSIS — D509 Iron deficiency anemia, unspecified: Secondary | ICD-10-CM | POA: Diagnosis not present

## 2021-01-08 DIAGNOSIS — Z992 Dependence on renal dialysis: Secondary | ICD-10-CM | POA: Diagnosis not present

## 2021-01-08 DIAGNOSIS — N2581 Secondary hyperparathyroidism of renal origin: Secondary | ICD-10-CM | POA: Diagnosis not present

## 2021-01-11 DIAGNOSIS — Z992 Dependence on renal dialysis: Secondary | ICD-10-CM | POA: Diagnosis not present

## 2021-01-11 DIAGNOSIS — D509 Iron deficiency anemia, unspecified: Secondary | ICD-10-CM | POA: Diagnosis not present

## 2021-01-11 DIAGNOSIS — T8249XS Other complication of vascular dialysis catheter, sequela: Secondary | ICD-10-CM | POA: Diagnosis not present

## 2021-01-11 DIAGNOSIS — N2581 Secondary hyperparathyroidism of renal origin: Secondary | ICD-10-CM | POA: Diagnosis not present

## 2021-01-11 DIAGNOSIS — N186 End stage renal disease: Secondary | ICD-10-CM | POA: Diagnosis not present

## 2021-01-12 DIAGNOSIS — U071 COVID-19: Secondary | ICD-10-CM | POA: Diagnosis not present

## 2021-01-13 DIAGNOSIS — T8249XS Other complication of vascular dialysis catheter, sequela: Secondary | ICD-10-CM | POA: Diagnosis not present

## 2021-01-13 DIAGNOSIS — N2581 Secondary hyperparathyroidism of renal origin: Secondary | ICD-10-CM | POA: Diagnosis not present

## 2021-01-13 DIAGNOSIS — Z992 Dependence on renal dialysis: Secondary | ICD-10-CM | POA: Diagnosis not present

## 2021-01-13 DIAGNOSIS — N186 End stage renal disease: Secondary | ICD-10-CM | POA: Diagnosis not present

## 2021-01-13 DIAGNOSIS — D509 Iron deficiency anemia, unspecified: Secondary | ICD-10-CM | POA: Diagnosis not present

## 2021-01-15 DIAGNOSIS — N186 End stage renal disease: Secondary | ICD-10-CM | POA: Diagnosis not present

## 2021-01-15 DIAGNOSIS — Z992 Dependence on renal dialysis: Secondary | ICD-10-CM | POA: Diagnosis not present

## 2021-01-15 DIAGNOSIS — D509 Iron deficiency anemia, unspecified: Secondary | ICD-10-CM | POA: Diagnosis not present

## 2021-01-15 DIAGNOSIS — N2581 Secondary hyperparathyroidism of renal origin: Secondary | ICD-10-CM | POA: Diagnosis not present

## 2021-01-15 DIAGNOSIS — T8249XS Other complication of vascular dialysis catheter, sequela: Secondary | ICD-10-CM | POA: Diagnosis not present

## 2021-01-18 DIAGNOSIS — N186 End stage renal disease: Secondary | ICD-10-CM | POA: Diagnosis not present

## 2021-01-18 DIAGNOSIS — E1110 Type 2 diabetes mellitus with ketoacidosis without coma: Secondary | ICD-10-CM | POA: Diagnosis not present

## 2021-01-18 DIAGNOSIS — Z992 Dependence on renal dialysis: Secondary | ICD-10-CM | POA: Diagnosis not present

## 2021-01-18 DIAGNOSIS — T8249XS Other complication of vascular dialysis catheter, sequela: Secondary | ICD-10-CM | POA: Diagnosis not present

## 2021-01-18 DIAGNOSIS — N2581 Secondary hyperparathyroidism of renal origin: Secondary | ICD-10-CM | POA: Diagnosis not present

## 2021-01-18 DIAGNOSIS — D509 Iron deficiency anemia, unspecified: Secondary | ICD-10-CM | POA: Diagnosis not present

## 2021-01-19 ENCOUNTER — Telehealth: Payer: Self-pay

## 2021-01-19 ENCOUNTER — Telehealth: Payer: Medicare HMO

## 2021-01-19 NOTE — Telephone Encounter (Signed)
  Care Management   Follow Up Note   01/19/2021 Name: Kamirah Shugrue MRN: [id number] DOB: 1935-09-13   Referred by: [name], MD Reason for referral : Chronic Care Management (Unsuccessful attempt #2)   A second unsuccessful telephone outreach was attempted today. The patient was referred to the case management team for assistance with care management and care coordination. SW left a HIPAA compliant voice message for the patients daughter, [name], requesting a return call.  Follow Up Plan: The care management team will reach out to the patient again over the next 30 days.   Daneen Schick, BSW, CDP Social Worker, Certified Dementia Practitioner [place] / [place] Management [phone]

## 2021-01-20 DIAGNOSIS — D509 Iron deficiency anemia, unspecified: Secondary | ICD-10-CM | POA: Diagnosis not present

## 2021-01-20 DIAGNOSIS — T8249XS Other complication of vascular dialysis catheter, sequela: Secondary | ICD-10-CM | POA: Diagnosis not present

## 2021-01-20 DIAGNOSIS — N186 End stage renal disease: Secondary | ICD-10-CM | POA: Diagnosis not present

## 2021-01-20 DIAGNOSIS — Z992 Dependence on renal dialysis: Secondary | ICD-10-CM | POA: Diagnosis not present

## 2021-01-20 DIAGNOSIS — E1110 Type 2 diabetes mellitus with ketoacidosis without coma: Secondary | ICD-10-CM | POA: Diagnosis not present

## 2021-01-20 DIAGNOSIS — N2581 Secondary hyperparathyroidism of renal origin: Secondary | ICD-10-CM | POA: Diagnosis not present

## 2021-01-22 DIAGNOSIS — N186 End stage renal disease: Secondary | ICD-10-CM | POA: Diagnosis not present

## 2021-01-22 DIAGNOSIS — E1110 Type 2 diabetes mellitus with ketoacidosis without coma: Secondary | ICD-10-CM | POA: Diagnosis not present

## 2021-01-22 DIAGNOSIS — D509 Iron deficiency anemia, unspecified: Secondary | ICD-10-CM | POA: Diagnosis not present

## 2021-01-22 DIAGNOSIS — T8249XS Other complication of vascular dialysis catheter, sequela: Secondary | ICD-10-CM | POA: Diagnosis not present

## 2021-01-22 DIAGNOSIS — N2581 Secondary hyperparathyroidism of renal origin: Secondary | ICD-10-CM | POA: Diagnosis not present

## 2021-01-22 DIAGNOSIS — Z992 Dependence on renal dialysis: Secondary | ICD-10-CM | POA: Diagnosis not present

## 2021-01-25 DIAGNOSIS — D509 Iron deficiency anemia, unspecified: Secondary | ICD-10-CM | POA: Diagnosis not present

## 2021-01-25 DIAGNOSIS — T8249XS Other complication of vascular dialysis catheter, sequela: Secondary | ICD-10-CM | POA: Diagnosis not present

## 2021-01-25 DIAGNOSIS — N2581 Secondary hyperparathyroidism of renal origin: Secondary | ICD-10-CM | POA: Diagnosis not present

## 2021-01-25 DIAGNOSIS — N186 End stage renal disease: Secondary | ICD-10-CM | POA: Diagnosis not present

## 2021-01-25 DIAGNOSIS — Z992 Dependence on renal dialysis: Secondary | ICD-10-CM | POA: Diagnosis not present

## 2021-01-26 DIAGNOSIS — I4891 Unspecified atrial fibrillation: Secondary | ICD-10-CM | POA: Diagnosis not present

## 2021-01-26 DIAGNOSIS — I132 Hypertensive heart and chronic kidney disease with heart failure and with stage 5 chronic kidney disease, or end stage renal disease: Secondary | ICD-10-CM | POA: Diagnosis not present

## 2021-01-26 DIAGNOSIS — E1122 Type 2 diabetes mellitus with diabetic chronic kidney disease: Secondary | ICD-10-CM | POA: Diagnosis not present

## 2021-01-26 DIAGNOSIS — E785 Hyperlipidemia, unspecified: Secondary | ICD-10-CM | POA: Diagnosis not present

## 2021-01-26 DIAGNOSIS — Z794 Long term (current) use of insulin: Secondary | ICD-10-CM | POA: Diagnosis not present

## 2021-01-26 DIAGNOSIS — I509 Heart failure, unspecified: Secondary | ICD-10-CM | POA: Diagnosis not present

## 2021-01-26 DIAGNOSIS — D6869 Other thrombophilia: Secondary | ICD-10-CM | POA: Diagnosis not present

## 2021-01-26 DIAGNOSIS — N186 End stage renal disease: Secondary | ICD-10-CM | POA: Diagnosis not present

## 2021-01-26 DIAGNOSIS — Z992 Dependence on renal dialysis: Secondary | ICD-10-CM | POA: Diagnosis not present

## 2021-01-26 DIAGNOSIS — R69 Illness, unspecified: Secondary | ICD-10-CM | POA: Diagnosis not present

## 2021-01-27 DIAGNOSIS — Z992 Dependence on renal dialysis: Secondary | ICD-10-CM | POA: Diagnosis not present

## 2021-01-27 DIAGNOSIS — T8249XS Other complication of vascular dialysis catheter, sequela: Secondary | ICD-10-CM | POA: Diagnosis not present

## 2021-01-27 DIAGNOSIS — N186 End stage renal disease: Secondary | ICD-10-CM | POA: Diagnosis not present

## 2021-01-27 DIAGNOSIS — N2581 Secondary hyperparathyroidism of renal origin: Secondary | ICD-10-CM | POA: Diagnosis not present

## 2021-01-27 DIAGNOSIS — D509 Iron deficiency anemia, unspecified: Secondary | ICD-10-CM | POA: Diagnosis not present

## 2021-01-28 ENCOUNTER — Encounter: Payer: Medicare Other | Admitting: Internal Medicine

## 2021-01-29 DIAGNOSIS — N2581 Secondary hyperparathyroidism of renal origin: Secondary | ICD-10-CM | POA: Diagnosis not present

## 2021-01-29 DIAGNOSIS — N186 End stage renal disease: Secondary | ICD-10-CM | POA: Diagnosis not present

## 2021-01-29 DIAGNOSIS — Z992 Dependence on renal dialysis: Secondary | ICD-10-CM | POA: Diagnosis not present

## 2021-01-29 DIAGNOSIS — D509 Iron deficiency anemia, unspecified: Secondary | ICD-10-CM | POA: Diagnosis not present

## 2021-01-29 DIAGNOSIS — T8249XS Other complication of vascular dialysis catheter, sequela: Secondary | ICD-10-CM | POA: Diagnosis not present

## 2021-02-01 DIAGNOSIS — T8249XS Other complication of vascular dialysis catheter, sequela: Secondary | ICD-10-CM | POA: Diagnosis not present

## 2021-02-01 DIAGNOSIS — Z992 Dependence on renal dialysis: Secondary | ICD-10-CM | POA: Diagnosis not present

## 2021-02-01 DIAGNOSIS — N186 End stage renal disease: Secondary | ICD-10-CM | POA: Diagnosis not present

## 2021-02-01 DIAGNOSIS — D509 Iron deficiency anemia, unspecified: Secondary | ICD-10-CM | POA: Diagnosis not present

## 2021-02-01 DIAGNOSIS — N2581 Secondary hyperparathyroidism of renal origin: Secondary | ICD-10-CM | POA: Diagnosis not present

## 2021-02-03 DIAGNOSIS — Z992 Dependence on renal dialysis: Secondary | ICD-10-CM | POA: Diagnosis not present

## 2021-02-03 DIAGNOSIS — D509 Iron deficiency anemia, unspecified: Secondary | ICD-10-CM | POA: Diagnosis not present

## 2021-02-03 DIAGNOSIS — N2581 Secondary hyperparathyroidism of renal origin: Secondary | ICD-10-CM | POA: Diagnosis not present

## 2021-02-03 DIAGNOSIS — T8249XS Other complication of vascular dialysis catheter, sequela: Secondary | ICD-10-CM | POA: Diagnosis not present

## 2021-02-03 DIAGNOSIS — N186 End stage renal disease: Secondary | ICD-10-CM | POA: Diagnosis not present

## 2021-02-04 DIAGNOSIS — E1122 Type 2 diabetes mellitus with diabetic chronic kidney disease: Secondary | ICD-10-CM | POA: Diagnosis not present

## 2021-02-04 DIAGNOSIS — Z992 Dependence on renal dialysis: Secondary | ICD-10-CM | POA: Diagnosis not present

## 2021-02-04 DIAGNOSIS — N186 End stage renal disease: Secondary | ICD-10-CM | POA: Diagnosis not present

## 2021-02-05 DIAGNOSIS — Z992 Dependence on renal dialysis: Secondary | ICD-10-CM | POA: Diagnosis not present

## 2021-02-05 DIAGNOSIS — N2581 Secondary hyperparathyroidism of renal origin: Secondary | ICD-10-CM | POA: Diagnosis not present

## 2021-02-05 DIAGNOSIS — T8249XS Other complication of vascular dialysis catheter, sequela: Secondary | ICD-10-CM | POA: Diagnosis not present

## 2021-02-05 DIAGNOSIS — D509 Iron deficiency anemia, unspecified: Secondary | ICD-10-CM | POA: Diagnosis not present

## 2021-02-05 DIAGNOSIS — N186 End stage renal disease: Secondary | ICD-10-CM | POA: Diagnosis not present

## 2021-02-06 ENCOUNTER — Other Ambulatory Visit: Payer: Self-pay | Admitting: Internal Medicine

## 2021-02-06 DIAGNOSIS — R413 Other amnesia: Secondary | ICD-10-CM

## 2021-02-08 DIAGNOSIS — N2581 Secondary hyperparathyroidism of renal origin: Secondary | ICD-10-CM | POA: Diagnosis not present

## 2021-02-08 DIAGNOSIS — T8249XS Other complication of vascular dialysis catheter, sequela: Secondary | ICD-10-CM | POA: Diagnosis not present

## 2021-02-08 DIAGNOSIS — N186 End stage renal disease: Secondary | ICD-10-CM | POA: Diagnosis not present

## 2021-02-08 DIAGNOSIS — Z992 Dependence on renal dialysis: Secondary | ICD-10-CM | POA: Diagnosis not present

## 2021-02-09 ENCOUNTER — Telehealth: Payer: Medicare HMO

## 2021-02-09 ENCOUNTER — Telehealth: Payer: Self-pay

## 2021-02-09 NOTE — Telephone Encounter (Signed)
  Care Management   Follow Up Note   02/09/2021 Name: [name] [name] MRN: [id number] DOB: 07-23-1936   Referred by: [name], MD Reason for referral : Chronic Care Management (Initial RN CM Outreach - 2nd attempt )   A second unsuccessful telephone outreach was attempted today. The patient was referred to the case management team for assistance with care management and care coordination.   Follow Up Plan: Telephone follow up appointment with care management team member scheduled for: 03/16/21  Barb Merino, RN, BSN, CCM Care Management Coordinator [place] [facility]  Direct Phone: [phone]

## 2021-02-10 DIAGNOSIS — T8249XS Other complication of vascular dialysis catheter, sequela: Secondary | ICD-10-CM | POA: Diagnosis not present

## 2021-02-10 DIAGNOSIS — N2581 Secondary hyperparathyroidism of renal origin: Secondary | ICD-10-CM | POA: Diagnosis not present

## 2021-02-10 DIAGNOSIS — N186 End stage renal disease: Secondary | ICD-10-CM | POA: Diagnosis not present

## 2021-02-10 DIAGNOSIS — Z992 Dependence on renal dialysis: Secondary | ICD-10-CM | POA: Diagnosis not present

## 2021-02-11 ENCOUNTER — Telehealth: Payer: Medicare HMO

## 2021-02-11 ENCOUNTER — Ambulatory Visit: Payer: Self-pay

## 2021-02-11 DIAGNOSIS — R413 Other amnesia: Secondary | ICD-10-CM

## 2021-02-11 DIAGNOSIS — N185 Chronic kidney disease, stage 5: Secondary | ICD-10-CM

## 2021-02-11 DIAGNOSIS — I10 Essential (primary) hypertension: Secondary | ICD-10-CM

## 2021-02-11 DIAGNOSIS — U071 COVID-19: Secondary | ICD-10-CM | POA: Diagnosis not present

## 2021-02-11 NOTE — Patient Instructions (Signed)
Visit Information   Goals Addressed             This Visit's Progress    COMPLETED: Barriers to Treatment Identified and Managed       Timeframe:  [place] Goal Priority:  Low Start Date: 3.10.22                            Expected End Date: 7.8.22                      7.7.22- Goal closed due to inability to maintain patient contact  Patient Goals/Self-Care Activities Over the next 60 days, patient will: With the help of her daughter  - Patient will self administer medications as prescribed Patient will attend all scheduled provider appointments [place] to schedule an eye exam Follow up with Neurology for new patient appointment Contact SW as needed prior to next scheduled call         No SW follow up planned  Daneen Schick, BSW, CDP Social Worker, Certified Dementia Practitioner [place] / [place] Management [phone]

## 2021-02-11 NOTE — Chronic Care Management (AMB) (Signed)
Care Management   Follow Up Note   02/11/2021 Name: [name] [name] MRN: [id number] DOB: Nov 08, 1935   Referred by: [name], MD Reason for referral : Chronic Care Management (Third unsuccessful call attempt)   Third unsuccessful telephone outreach was attempted today. The patient was referred to the case management team for assistance with care management and care coordination. The patient's primary care provider has been notified of our unsuccessful attempts to make or maintain contact with the patient. The care management team is pleased to engage with this patient at any time in the future should he/she be interested in assistance from the care management team.    Patient Care Plan: Social Work [place]  Completed 02/11/2021   Problem Identified: Barriers to Treatment Resolved 02/11/2021     [place] Goal: Barriers to Treatment Identified and Managed Completed 02/11/2021  Start Date: 10/15/2020  Expected End Date: 02/12/2021  Recent Progress: On track  Priority: Low  Note:   Current Barriers:  Chronic disease management support and education needs related to CKD Stage V, Hypertensive Heart Disease and Renal Disease with Heart Failure Memory Deficits  Social Worker Clinical Goal(s):  Over the next 120 days, patient and her daughter will work with SW to identify and address any acute and/or chronic care coordination needs related to the self health management of CKD Stage V and Hypertensive Heart Disease and Renal Disease with Heart Failure    7.7.22- Goal closed due to inability to maintain patient contact  CCM SW Interventions:  Inter-disciplinary care team collaboration (see longitudinal plan of care) Collaboration with [name], MD regarding development and update of comprehensive plan of care as evidenced by provider attestation and co-signature Successful outbound call placed to the patients daughter [name] [name] to conduct a SW screen Discussed the patient  has had some declines in memory recently and has started a new medication Referral was placed to neurology - [name] [name] reports she received a voice message to schedule an appointment and plans to return the call later today Determined the patient lives in a handicap accessible apartment - which includes a ramp DME in the home includes a wheelchair, 3 in 1 commode, and a shower chair - [name] [name] reports the patient obtained these items after a hospitalization but no longer needs or uses them Patients family is very supportive and assists 24/7 with care needs - [name] [name] stays with the patient M-F during the day, her older brother stays at night, and her sister stays with the patient on weekends Discussed the family stays with the patient 24/7 to assist with medication management and out of concern the patient may be a fall risk - it is reported patient has ad no recent falls Patient attends Dialysis M, W, F - family very happy with center and so is patient Determined the patient is in need of an eye exam as hers is not current and family has noticed changes in vision - provided [facility] number to [facility] [phone]) on [facility] who the patient has seen in the past Encouraged [name] [name] to contact [facility] to scheduled an office visit Discussed [place] term follow up with SW while patient engages with  RN Case Manager  to address care management needs Scheduled follow up call over the next 60 days Collaboration with [place] to inform of interventions and plan  Patient Goals/Self-Care Activities Over the next 60 days, patient will: With the help of her daughter  - Patient  will self administer medications as prescribed Patient will attend all scheduled provider appointments [place] to schedule an eye exam Follow up with Neurology for new patient appointment Contact SW as needed prior to next scheduled call  Follow Up Plan:  SW will follow up with the patient  over the next 60 days       Follow Up Plan: No SW follow up planned at this time due to inability to maintain patient contact.  Daneen Schick, BSW, CDP Social Worker, Certified Dementia Practitioner [place] / [place] Management [phone]

## 2021-02-12 DIAGNOSIS — N2581 Secondary hyperparathyroidism of renal origin: Secondary | ICD-10-CM | POA: Diagnosis not present

## 2021-02-12 DIAGNOSIS — T8249XS Other complication of vascular dialysis catheter, sequela: Secondary | ICD-10-CM | POA: Diagnosis not present

## 2021-02-12 DIAGNOSIS — Z992 Dependence on renal dialysis: Secondary | ICD-10-CM | POA: Diagnosis not present

## 2021-02-12 DIAGNOSIS — N186 End stage renal disease: Secondary | ICD-10-CM | POA: Diagnosis not present

## 2021-02-15 DIAGNOSIS — N2581 Secondary hyperparathyroidism of renal origin: Secondary | ICD-10-CM | POA: Diagnosis not present

## 2021-02-15 DIAGNOSIS — N186 End stage renal disease: Secondary | ICD-10-CM | POA: Diagnosis not present

## 2021-02-15 DIAGNOSIS — T8249XS Other complication of vascular dialysis catheter, sequela: Secondary | ICD-10-CM | POA: Diagnosis not present

## 2021-02-15 DIAGNOSIS — Z992 Dependence on renal dialysis: Secondary | ICD-10-CM | POA: Diagnosis not present

## 2021-02-16 ENCOUNTER — Other Ambulatory Visit: Payer: Self-pay | Admitting: Internal Medicine

## 2021-02-16 DIAGNOSIS — E785 Hyperlipidemia, unspecified: Secondary | ICD-10-CM

## 2021-02-17 DIAGNOSIS — Z992 Dependence on renal dialysis: Secondary | ICD-10-CM | POA: Diagnosis not present

## 2021-02-17 DIAGNOSIS — N186 End stage renal disease: Secondary | ICD-10-CM | POA: Diagnosis not present

## 2021-02-17 DIAGNOSIS — T8249XS Other complication of vascular dialysis catheter, sequela: Secondary | ICD-10-CM | POA: Diagnosis not present

## 2021-02-17 DIAGNOSIS — N2581 Secondary hyperparathyroidism of renal origin: Secondary | ICD-10-CM | POA: Diagnosis not present

## 2021-02-19 DIAGNOSIS — N186 End stage renal disease: Secondary | ICD-10-CM | POA: Diagnosis not present

## 2021-02-19 DIAGNOSIS — Z992 Dependence on renal dialysis: Secondary | ICD-10-CM | POA: Diagnosis not present

## 2021-02-19 DIAGNOSIS — T8249XS Other complication of vascular dialysis catheter, sequela: Secondary | ICD-10-CM | POA: Diagnosis not present

## 2021-02-19 DIAGNOSIS — N2581 Secondary hyperparathyroidism of renal origin: Secondary | ICD-10-CM | POA: Diagnosis not present

## 2021-02-22 DIAGNOSIS — N2581 Secondary hyperparathyroidism of renal origin: Secondary | ICD-10-CM | POA: Diagnosis not present

## 2021-02-22 DIAGNOSIS — N186 End stage renal disease: Secondary | ICD-10-CM | POA: Diagnosis not present

## 2021-02-22 DIAGNOSIS — E1110 Type 2 diabetes mellitus with ketoacidosis without coma: Secondary | ICD-10-CM | POA: Diagnosis not present

## 2021-02-22 DIAGNOSIS — Z992 Dependence on renal dialysis: Secondary | ICD-10-CM | POA: Diagnosis not present

## 2021-02-22 DIAGNOSIS — T8249XS Other complication of vascular dialysis catheter, sequela: Secondary | ICD-10-CM | POA: Diagnosis not present

## 2021-02-24 DIAGNOSIS — E1110 Type 2 diabetes mellitus with ketoacidosis without coma: Secondary | ICD-10-CM | POA: Diagnosis not present

## 2021-02-24 DIAGNOSIS — Z992 Dependence on renal dialysis: Secondary | ICD-10-CM | POA: Diagnosis not present

## 2021-02-24 DIAGNOSIS — N2581 Secondary hyperparathyroidism of renal origin: Secondary | ICD-10-CM | POA: Diagnosis not present

## 2021-02-24 DIAGNOSIS — T8249XS Other complication of vascular dialysis catheter, sequela: Secondary | ICD-10-CM | POA: Diagnosis not present

## 2021-02-24 DIAGNOSIS — N186 End stage renal disease: Secondary | ICD-10-CM | POA: Diagnosis not present

## 2021-02-26 DIAGNOSIS — Z992 Dependence on renal dialysis: Secondary | ICD-10-CM | POA: Diagnosis not present

## 2021-02-26 DIAGNOSIS — N2581 Secondary hyperparathyroidism of renal origin: Secondary | ICD-10-CM | POA: Diagnosis not present

## 2021-02-26 DIAGNOSIS — N186 End stage renal disease: Secondary | ICD-10-CM | POA: Diagnosis not present

## 2021-02-26 DIAGNOSIS — T8249XS Other complication of vascular dialysis catheter, sequela: Secondary | ICD-10-CM | POA: Diagnosis not present

## 2021-02-26 DIAGNOSIS — E1110 Type 2 diabetes mellitus with ketoacidosis without coma: Secondary | ICD-10-CM | POA: Diagnosis not present

## 2021-03-01 DIAGNOSIS — N2581 Secondary hyperparathyroidism of renal origin: Secondary | ICD-10-CM | POA: Diagnosis not present

## 2021-03-01 DIAGNOSIS — T8249XS Other complication of vascular dialysis catheter, sequela: Secondary | ICD-10-CM | POA: Diagnosis not present

## 2021-03-01 DIAGNOSIS — Z992 Dependence on renal dialysis: Secondary | ICD-10-CM | POA: Diagnosis not present

## 2021-03-01 DIAGNOSIS — N186 End stage renal disease: Secondary | ICD-10-CM | POA: Diagnosis not present

## 2021-03-03 ENCOUNTER — Other Ambulatory Visit: Payer: Self-pay | Admitting: Internal Medicine

## 2021-03-03 ENCOUNTER — Other Ambulatory Visit: Payer: Self-pay | Admitting: Nurse Practitioner

## 2021-03-03 ENCOUNTER — Telehealth: Payer: Self-pay | Admitting: Internal Medicine

## 2021-03-03 DIAGNOSIS — T8249XS Other complication of vascular dialysis catheter, sequela: Secondary | ICD-10-CM | POA: Diagnosis not present

## 2021-03-03 DIAGNOSIS — N2581 Secondary hyperparathyroidism of renal origin: Secondary | ICD-10-CM | POA: Diagnosis not present

## 2021-03-03 DIAGNOSIS — N186 End stage renal disease: Secondary | ICD-10-CM | POA: Diagnosis not present

## 2021-03-03 DIAGNOSIS — Z992 Dependence on renal dialysis: Secondary | ICD-10-CM | POA: Diagnosis not present

## 2021-03-03 NOTE — Telephone Encounter (Signed)
Left message for patient to call back and schedule Medicare Annual Wellness Visit (AWV) either virtually or in office.   Last AWV 01/30/20 please schedule at anytime with [facility]    This should be a 45 minute visit.

## 2021-03-05 DIAGNOSIS — Z992 Dependence on renal dialysis: Secondary | ICD-10-CM | POA: Diagnosis not present

## 2021-03-05 DIAGNOSIS — N186 End stage renal disease: Secondary | ICD-10-CM | POA: Diagnosis not present

## 2021-03-05 DIAGNOSIS — N2581 Secondary hyperparathyroidism of renal origin: Secondary | ICD-10-CM | POA: Diagnosis not present

## 2021-03-05 DIAGNOSIS — T8249XS Other complication of vascular dialysis catheter, sequela: Secondary | ICD-10-CM | POA: Diagnosis not present

## 2021-03-07 DIAGNOSIS — E1122 Type 2 diabetes mellitus with diabetic chronic kidney disease: Secondary | ICD-10-CM | POA: Diagnosis not present

## 2021-03-07 DIAGNOSIS — Z992 Dependence on renal dialysis: Secondary | ICD-10-CM | POA: Diagnosis not present

## 2021-03-07 DIAGNOSIS — N186 End stage renal disease: Secondary | ICD-10-CM | POA: Diagnosis not present

## 2021-03-08 DIAGNOSIS — T8249XS Other complication of vascular dialysis catheter, sequela: Secondary | ICD-10-CM | POA: Diagnosis not present

## 2021-03-08 DIAGNOSIS — N186 End stage renal disease: Secondary | ICD-10-CM | POA: Diagnosis not present

## 2021-03-08 DIAGNOSIS — N2581 Secondary hyperparathyroidism of renal origin: Secondary | ICD-10-CM | POA: Diagnosis not present

## 2021-03-08 DIAGNOSIS — Z992 Dependence on renal dialysis: Secondary | ICD-10-CM | POA: Diagnosis not present

## 2021-03-10 DIAGNOSIS — Z992 Dependence on renal dialysis: Secondary | ICD-10-CM | POA: Diagnosis not present

## 2021-03-10 DIAGNOSIS — T8249XS Other complication of vascular dialysis catheter, sequela: Secondary | ICD-10-CM | POA: Diagnosis not present

## 2021-03-10 DIAGNOSIS — N186 End stage renal disease: Secondary | ICD-10-CM | POA: Diagnosis not present

## 2021-03-10 DIAGNOSIS — N2581 Secondary hyperparathyroidism of renal origin: Secondary | ICD-10-CM | POA: Diagnosis not present

## 2021-03-11 DIAGNOSIS — Z452 Encounter for adjustment and management of vascular access device: Secondary | ICD-10-CM | POA: Diagnosis not present

## 2021-03-11 DIAGNOSIS — N186 End stage renal disease: Secondary | ICD-10-CM | POA: Diagnosis not present

## 2021-03-11 DIAGNOSIS — Z992 Dependence on renal dialysis: Secondary | ICD-10-CM | POA: Diagnosis not present

## 2021-03-12 DIAGNOSIS — Z992 Dependence on renal dialysis: Secondary | ICD-10-CM | POA: Diagnosis not present

## 2021-03-12 DIAGNOSIS — T8249XS Other complication of vascular dialysis catheter, sequela: Secondary | ICD-10-CM | POA: Diagnosis not present

## 2021-03-12 DIAGNOSIS — N2581 Secondary hyperparathyroidism of renal origin: Secondary | ICD-10-CM | POA: Diagnosis not present

## 2021-03-12 DIAGNOSIS — N186 End stage renal disease: Secondary | ICD-10-CM | POA: Diagnosis not present

## 2021-03-14 DIAGNOSIS — U071 COVID-19: Secondary | ICD-10-CM | POA: Diagnosis not present

## 2021-03-15 DIAGNOSIS — N186 End stage renal disease: Secondary | ICD-10-CM | POA: Diagnosis not present

## 2021-03-15 DIAGNOSIS — T8249XS Other complication of vascular dialysis catheter, sequela: Secondary | ICD-10-CM | POA: Diagnosis not present

## 2021-03-15 DIAGNOSIS — Z992 Dependence on renal dialysis: Secondary | ICD-10-CM | POA: Diagnosis not present

## 2021-03-15 DIAGNOSIS — N2581 Secondary hyperparathyroidism of renal origin: Secondary | ICD-10-CM | POA: Diagnosis not present

## 2021-03-16 ENCOUNTER — Telehealth: Payer: Medicare HMO

## 2021-03-16 ENCOUNTER — Telehealth: Payer: Self-pay

## 2021-03-16 NOTE — Telephone Encounter (Signed)
  Care Management   Follow Up Note   03/16/2021 Name: [name] [name] MRN: [id number] DOB: March 31, 1936   Referred by: [name], MD Reason for referral : Chronic Care Management (Initial RN CM Outreach - 3rd attempt )   Third unsuccessful telephone outreach was attempted today. The patient was referred to the case management team for assistance with care management and care coordination. The patient's primary care provider has been notified of our unsuccessful attempts to make or maintain contact with the patient. The care management team is pleased to engage with this patient at any time in the future should he/she be interested in assistance from the care management team.   Follow Up Plan: A HIPPA compliant phone message was left for the patient providing contact information and requesting a return call.   Barb Merino, RN, BSN, CCM Care Management Coordinator [place] [facility]  Direct Phone: [phone]

## 2021-03-17 DIAGNOSIS — Z992 Dependence on renal dialysis: Secondary | ICD-10-CM | POA: Diagnosis not present

## 2021-03-17 DIAGNOSIS — N186 End stage renal disease: Secondary | ICD-10-CM | POA: Diagnosis not present

## 2021-03-17 DIAGNOSIS — N2581 Secondary hyperparathyroidism of renal origin: Secondary | ICD-10-CM | POA: Diagnosis not present

## 2021-03-17 DIAGNOSIS — T8249XS Other complication of vascular dialysis catheter, sequela: Secondary | ICD-10-CM | POA: Diagnosis not present

## 2021-03-19 DIAGNOSIS — Z992 Dependence on renal dialysis: Secondary | ICD-10-CM | POA: Diagnosis not present

## 2021-03-19 DIAGNOSIS — N186 End stage renal disease: Secondary | ICD-10-CM | POA: Diagnosis not present

## 2021-03-19 DIAGNOSIS — T8249XS Other complication of vascular dialysis catheter, sequela: Secondary | ICD-10-CM | POA: Diagnosis not present

## 2021-03-19 DIAGNOSIS — N2581 Secondary hyperparathyroidism of renal origin: Secondary | ICD-10-CM | POA: Diagnosis not present

## 2021-03-22 DIAGNOSIS — Z992 Dependence on renal dialysis: Secondary | ICD-10-CM | POA: Diagnosis not present

## 2021-03-22 DIAGNOSIS — N186 End stage renal disease: Secondary | ICD-10-CM | POA: Diagnosis not present

## 2021-03-22 DIAGNOSIS — T8249XS Other complication of vascular dialysis catheter, sequela: Secondary | ICD-10-CM | POA: Diagnosis not present

## 2021-03-22 DIAGNOSIS — N2581 Secondary hyperparathyroidism of renal origin: Secondary | ICD-10-CM | POA: Diagnosis not present

## 2021-03-22 DIAGNOSIS — E1110 Type 2 diabetes mellitus with ketoacidosis without coma: Secondary | ICD-10-CM | POA: Diagnosis not present

## 2021-03-24 DIAGNOSIS — N2581 Secondary hyperparathyroidism of renal origin: Secondary | ICD-10-CM | POA: Diagnosis not present

## 2021-03-24 DIAGNOSIS — E1110 Type 2 diabetes mellitus with ketoacidosis without coma: Secondary | ICD-10-CM | POA: Diagnosis not present

## 2021-03-24 DIAGNOSIS — Z992 Dependence on renal dialysis: Secondary | ICD-10-CM | POA: Diagnosis not present

## 2021-03-24 DIAGNOSIS — T8249XS Other complication of vascular dialysis catheter, sequela: Secondary | ICD-10-CM | POA: Diagnosis not present

## 2021-03-24 DIAGNOSIS — N186 End stage renal disease: Secondary | ICD-10-CM | POA: Diagnosis not present

## 2021-03-25 ENCOUNTER — Ambulatory Visit: Payer: Self-pay

## 2021-03-25 ENCOUNTER — Telehealth: Payer: Medicare HMO

## 2021-03-25 DIAGNOSIS — R413 Other amnesia: Secondary | ICD-10-CM

## 2021-03-25 DIAGNOSIS — N185 Chronic kidney disease, stage 5: Secondary | ICD-10-CM

## 2021-03-25 DIAGNOSIS — I10 Essential (primary) hypertension: Secondary | ICD-10-CM

## 2021-03-25 DIAGNOSIS — Z992 Dependence on renal dialysis: Secondary | ICD-10-CM

## 2021-03-25 NOTE — Chronic Care Management (AMB) (Signed)
  Care Management   Follow Up Note   03/25/2021 Name: [name] [name] MRN: [id number] DOB: 1936/05/08   Referred by: [name], MD Reason for referral : No chief complaint on file.  Three unsuccessful telephone outreach attempts have been made to contact this patient. The patient was referred to the case management team for assistance with care management and care coordination. The patient's primary care provider has been notified of our unsuccessful attempts to make or maintain contact with the patient. The care management team is pleases to engage the patient at [place] time in the future should she be interested in assistance from the care management team.   Follow Up Plan: We have been unable to make contact with the patient for follow up. The care management team is available to follow up with the patient after provider conversation with the patient regarding recommendation for care management engagement and subsequent re-referral to the care management team.   Barb Merino, RN, BSN, CCM Care Management Coordinator [place] [facility]  Direct Phone: [phone]

## 2021-03-26 DIAGNOSIS — E1110 Type 2 diabetes mellitus with ketoacidosis without coma: Secondary | ICD-10-CM | POA: Diagnosis not present

## 2021-03-26 DIAGNOSIS — N186 End stage renal disease: Secondary | ICD-10-CM | POA: Diagnosis not present

## 2021-03-26 DIAGNOSIS — N2581 Secondary hyperparathyroidism of renal origin: Secondary | ICD-10-CM | POA: Diagnosis not present

## 2021-03-26 DIAGNOSIS — Z992 Dependence on renal dialysis: Secondary | ICD-10-CM | POA: Diagnosis not present

## 2021-03-26 DIAGNOSIS — T8249XS Other complication of vascular dialysis catheter, sequela: Secondary | ICD-10-CM | POA: Diagnosis not present

## 2021-03-29 DIAGNOSIS — Z992 Dependence on renal dialysis: Secondary | ICD-10-CM | POA: Diagnosis not present

## 2021-03-29 DIAGNOSIS — N2581 Secondary hyperparathyroidism of renal origin: Secondary | ICD-10-CM | POA: Diagnosis not present

## 2021-03-29 DIAGNOSIS — D509 Iron deficiency anemia, unspecified: Secondary | ICD-10-CM | POA: Diagnosis not present

## 2021-03-29 DIAGNOSIS — N186 End stage renal disease: Secondary | ICD-10-CM | POA: Diagnosis not present

## 2021-03-31 DIAGNOSIS — N2581 Secondary hyperparathyroidism of renal origin: Secondary | ICD-10-CM | POA: Diagnosis not present

## 2021-03-31 DIAGNOSIS — D509 Iron deficiency anemia, unspecified: Secondary | ICD-10-CM | POA: Diagnosis not present

## 2021-03-31 DIAGNOSIS — Z992 Dependence on renal dialysis: Secondary | ICD-10-CM | POA: Diagnosis not present

## 2021-03-31 DIAGNOSIS — N186 End stage renal disease: Secondary | ICD-10-CM | POA: Diagnosis not present

## 2021-04-01 ENCOUNTER — Other Ambulatory Visit: Payer: Self-pay

## 2021-04-01 ENCOUNTER — Encounter: Payer: Self-pay | Admitting: Internal Medicine

## 2021-04-01 ENCOUNTER — Ambulatory Visit (INDEPENDENT_AMBULATORY_CARE_PROVIDER_SITE_OTHER): Payer: Medicare HMO | Admitting: Internal Medicine

## 2021-04-01 VITALS — BP 128/60 | HR 87 | Temp 99.0°F | Ht 59.2 in | Wt 131.4 lb

## 2021-04-01 DIAGNOSIS — N186 End stage renal disease: Secondary | ICD-10-CM

## 2021-04-01 DIAGNOSIS — Z79899 Other long term (current) drug therapy: Secondary | ICD-10-CM | POA: Diagnosis not present

## 2021-04-01 DIAGNOSIS — E1122 Type 2 diabetes mellitus with diabetic chronic kidney disease: Secondary | ICD-10-CM | POA: Diagnosis not present

## 2021-04-01 DIAGNOSIS — R634 Abnormal weight loss: Secondary | ICD-10-CM | POA: Diagnosis not present

## 2021-04-01 DIAGNOSIS — E663 Overweight: Secondary | ICD-10-CM

## 2021-04-01 DIAGNOSIS — I10 Essential (primary) hypertension: Secondary | ICD-10-CM

## 2021-04-01 DIAGNOSIS — Z794 Long term (current) use of insulin: Secondary | ICD-10-CM | POA: Diagnosis not present

## 2021-04-01 DIAGNOSIS — E1165 Type 2 diabetes mellitus with hyperglycemia: Secondary | ICD-10-CM

## 2021-04-01 DIAGNOSIS — Z6826 Body mass index (BMI) 26.0-26.9, adult: Secondary | ICD-10-CM

## 2021-04-01 DIAGNOSIS — I120 Hypertensive chronic kidney disease with stage 5 chronic kidney disease or end stage renal disease: Secondary | ICD-10-CM

## 2021-04-01 DIAGNOSIS — E2839 Other primary ovarian failure: Secondary | ICD-10-CM | POA: Diagnosis not present

## 2021-04-01 DIAGNOSIS — R0989 Other specified symptoms and signs involving the circulatory and respiratory systems: Secondary | ICD-10-CM | POA: Diagnosis not present

## 2021-04-01 DIAGNOSIS — IMO0002 Reserved for concepts with insufficient information to code with codable children: Secondary | ICD-10-CM

## 2021-04-01 DIAGNOSIS — N184 Chronic kidney disease, stage 4 (severe): Secondary | ICD-10-CM | POA: Diagnosis not present

## 2021-04-01 NOTE — Patient Instructions (Signed)

## 2021-04-01 NOTE — Progress Notes (Signed)
I,Tianna Badgett,acting as a Education administrator for Maximino Greenland, MD.,have documented all relevant documentation on the behalf of Maximino Greenland, MD,as directed by  Maximino Greenland, MD while in the presence of Maximino Greenland, MD.  This visit occurred during the SARS-CoV-2 public health emergency.  Safety protocols were in place, including screening questions prior to the visit, additional usage of staff PPE, and extensive cleaning of exam room while observing appropriate contact time as indicated for disinfecting solutions.  Subjective:     Patient ID: [name] [name] , female    DOB: [birth year] , 85 y.o.   MRN: [id number]   Chief Complaint  Patient presents with   Diabetes   Hypertension    HPI  Patient is here for DM and HTN follow up. She initially presented alone; however, I called her daughter in for discussion of treatment plan. She reports compliance with meds. She report tolerating dialysis "ok". This was started in Dec 2021 - hospitalized for COVID pneumonia, Stage 4 CKD progressed to ESRD during this hospitalization.   At this time, she denies headaches, chest pain and shortness of breath. Compliant with dialysis schedule.    Diabetes She presents for her follow-up diabetic visit. She has type 2 diabetes mellitus. Her disease course has been stable. There are no hypoglycemic associated symptoms. There are no diabetic associated symptoms. Pertinent negatives for diabetes include no blurred vision and no chest pain. There are no hypoglycemic complications. Symptoms are stable. There are no diabetic complications. Risk factors for coronary artery disease include diabetes mellitus, obesity and sedentary lifestyle. Current diabetic treatment includes oral agent (dual therapy) and oral agent (monotherapy). She is compliant with treatment all of the time. She is currently taking insulin at bedtime. Insulin injections are given by patient. Rotation sites for injection include the abdominal  wall. Her weight is decreasing steadily. She is following a generally healthy diet. When asked about meal planning, she reported none. She participates in exercise every other day. She does not see a podiatrist.Eye exam is current.  Hypertension This is a chronic problem. The current episode started more than 1 year ago. The problem is unchanged. The problem is uncontrolled. Pertinent negatives include no anxiety, blurred vision or chest pain. There are no associated agents to hypertension. Risk factors for coronary artery disease include obesity and diabetes mellitus. There are no compliance problems.     Past Medical History:  Diagnosis Date   Arthritis    RA   CHF (congestive heart failure) (HCC)    CKD (chronic kidney disease) stage 3, GFR 30-59 ml/min (HCC)    M-W-F dialysis   Dementia (HCC)    Diabetes mellitus without complication (HCC)    insulin dependent   Hyperlipidemia    Hypertension    Seasonal allergies    Shortness of breath dyspnea    occasional with exertion - no oxygen   Sleep apnea    does not use cpap   TIA (transient ischemic attack) 2015     Family History  Problem Relation Age of Onset   Diabetes type II Mother    Hypertension Mother    Diabetes type II Father    Hypertension Other    Hyperlipidemia Other    Stroke Other      Current Outpatient Medications:    amLODipine (NORVASC) 10 MG tablet, Take 10 mg by mouth daily., Disp: , Rfl:    ascorbic acid (VITAMIN C) 500 MG tablet, Take 1 tablet (500 mg total) by mouth  daily., Disp: , Rfl:    atorvastatin (LIPITOR) 20 MG tablet, TAKE 1 TABLET(20 MG) BY MOUTH DAILY AT 6 PM FOR CHOLESTEROL, Disp: 90 tablet, Rfl: 1   blood glucose meter kit and supplies KIT, Dispense based on patient and insurance preference. Use up to four times daily as directed. (FOR ICD-9 250.00, 250.01)., Disp: 1 each, Rfl: 0   cloNIDine (CATAPRES) 0.1 MG tablet, TAKE 1 TABLET(0.1 MG) BY MOUTH THREE TIMES DAILY (Patient taking  differently: Take 0.1 mg by mouth 3 (three) times daily.), Disp: 270 tablet, Rfl: 1   donepezil (ARICEPT) 5 MG tablet, TAKE 1 TABLET(5 MG) BY MOUTH AT BEDTIME, Disp: 30 tablet, Rfl: 2   ELIQUIS 2.5 MG TABS tablet, TAKE 1 TABLET(2.5 MG) BY MOUTH TWICE DAILY, Disp: 60 tablet, Rfl: 2   glucose blood test strip, Use as directed to check blood sugars 3 times per day dx: e11.65, Disp: 100 each, Rfl: 12   hydrALAZINE (APRESOLINE) 25 MG tablet, TAKE 1 TABLET BY MOUTH THREE TIMES DAILY WITH FOOD, Disp: 270 tablet, Rfl: 1   LEVEMIR FLEXTOUCH 100 UNIT/ML FlexTouch Pen, INJECT 30 UNITS WITH BREAKFAST AND 15 UNITS BEFORE BEDTIME, Disp: 45 mL, Rfl: 1   loratadine (CLARITIN) 10 MG tablet, Take 10 mg by mouth daily., Disp: , Rfl:    metoprolol tartrate (LOPRESSOR) 25 MG tablet, TAKE 1/2 TABLET(12.5 MG) BY MOUTH TWICE DAILY (Patient taking differently: Take 12.5 mg by mouth 2 (two) times daily.), Disp: 90 tablet, Rfl: 1   oxyCODONE-acetaminophen (PERCOCET) 10-325 MG tablet, Take 1 tablet by mouth every 6 (six) hours as needed for pain., Disp: 20 tablet, Rfl: 0   oxyCODONE-acetaminophen (PERCOCET) 10-325 MG tablet, Take 1 tablet by mouth every 6 (six) hours as needed for pain., Disp: 20 tablet, Rfl: 0   zinc sulfate 220 (50 Zn) MG capsule, Take 1 capsule (220 mg total) by mouth daily., Disp: , Rfl:    Allergies  Allergen Reactions   Penicillins Swelling and Rash     Review of Systems  Constitutional: Negative.   Eyes:  Negative for blurred vision.  Respiratory: Negative.    Cardiovascular: Negative.  Negative for chest pain.  Gastrointestinal: Negative.   Neurological: Negative.     Today's Vitals   04/01/21 1423  BP: 128/60  Pulse: 87  Temp: 99 F (37.2 C)  TempSrc: Oral  Weight: 131 lb 6.4 oz (59.6 kg)  Height: 4' 11.2" (1.504 m)   Body mass index is 26.36 kg/m.  Wt Readings from Last 3 Encounters:  04/01/21 131 lb 6.4 oz (59.6 kg)  12/10/20 134 lb 0.6 oz (60.8 kg)  12/01/20 134 lb (60.8 kg)     Objective:  Physical Exam Vitals and nursing note reviewed.  Constitutional:      Appearance: Normal appearance.  HENT:     Head: Normocephalic and atraumatic.     Nose:     Comments: Masked     Mouth/Throat:     Comments: Masked  Cardiovascular:     Rate and Rhythm: Normal rate and regular rhythm.     Pulses:          Dorsalis pedis pulses are 0 on the right side and 0 on the left side.     Heart sounds: Normal heart sounds.     Comments: Fistula left arm, pos thrill Pulmonary:     Effort: Pulmonary effort is normal.     Breath sounds: Normal breath sounds.  Musculoskeletal:     Cervical back: Normal range of  motion.  Feet:     Right foot:     Protective Sensation: 5 sites tested.  5 sites sensed.     Skin integrity: Dry skin present.     Left foot:     Protective Sensation: 5 sites tested.  5 sites sensed.     Skin integrity: Dry skin present.  Skin:    General: Skin is warm.  Neurological:     General: No focal deficit present.     Mental Status: She is alert.  Psychiatric:        Mood and Affect: Mood normal.        Behavior: Behavior normal.        Assessment And Plan:     1. Uncontrolled type 2 diabetes mellitus with ESRD (end-stage renal disease) ([place]) Comments: Diabetic foot exam performed. Chronic, I will check labs as listed below. I will adjust meds as needed. Importance of dietary compliance was d/w patient. - Hemoglobin A1c - CMP14+EGFR - CBC with Diff - Lipid panel  2. 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 Encouraged to follow low sodium diet. No med changes today.   3. Weight loss Comments: She has lost 3 lbs. Since May 2022; however, she has lost 20 lbs since Dec 2021. I encouraged patient and her daughter to speak with Renal dietician.  I will check labs as listed below. I will consider adding mirtazapine to her current regimen.  - Prealbumin  4.  Estrogen deficiency Comments: She agrees to referral for dexa scan. I will schedule this at the [place]; Future  5. Overweight with body mass index (BMI) of 26 to 26.9 in adult Comments: Advised to gradually increase her daily activity, aiming for at least 150 minutes of exercise per week.  6. Polypharmacy The [employer] pharmacogenomics process and benefits of testing was discussed with the patient. After the discussion, the patient made an informed consent to testing and the sample was collected and mailed to the external lab.  7. Decreased dorsalis pedis pulse Comments: I will refer her to Vascular for ABI. She denies rest pain.  - VAS [place] ABI WITH/WO TBI; Future  Patient was given opportunity to ask questions. Patient verbalized understanding of the plan and was able to repeat key elements of the plan. All questions were answered to their satisfaction.   I, Maximino Greenland, MD, have reviewed all documentation for this visit. The documentation on 04/01/21 for the exam, diagnosis, procedures, and orders are all accurate and complete.   IF YOU HAVE BEEN REFERRED TO A SPECIALIST, IT MAY TAKE 1-2 WEEKS TO SCHEDULE/PROCESS THE REFERRAL. IF YOU HAVE NOT HEARD FROM US/SPECIALIST IN TWO WEEKS, PLEASE GIVE [place] A CALL AT [phone].   THE PATIENT IS ENCOURAGED TO PRACTICE SOCIAL DISTANCING DUE TO THE COVID-19 PANDEMIC.

## 2021-04-02 DIAGNOSIS — N186 End stage renal disease: Secondary | ICD-10-CM | POA: Diagnosis not present

## 2021-04-02 DIAGNOSIS — D509 Iron deficiency anemia, unspecified: Secondary | ICD-10-CM | POA: Diagnosis not present

## 2021-04-02 DIAGNOSIS — Z992 Dependence on renal dialysis: Secondary | ICD-10-CM | POA: Diagnosis not present

## 2021-04-02 DIAGNOSIS — N2581 Secondary hyperparathyroidism of renal origin: Secondary | ICD-10-CM | POA: Diagnosis not present

## 2021-04-02 LAB — CMP14+EGFR
ALT: 9 IU/L (ref 0–32)
AST: 19 IU/L (ref 0–40)
Albumin/Globulin Ratio: 1.5 (ref 1.2–2.2)
Albumin: 4.3 g/dL (ref 3.6–4.6)
Alkaline Phosphatase: 108 IU/L (ref 44–121)
BUN/Creatinine Ratio: 6 — ABNORMAL LOW (ref 12–28)
BUN: 21 mg/dL (ref 8–27)
Bilirubin Total: 0.4 mg/dL (ref 0.0–1.2)
CO2: 26 mmol/L (ref 20–29)
Calcium: 9.5 mg/dL (ref 8.7–10.3)
Chloride: 94 mmol/L — ABNORMAL LOW (ref 96–106)
Creatinine, Ser: 3.74 mg/dL — ABNORMAL HIGH (ref 0.57–1.00)
Globulin, Total: 2.9 g/dL (ref 1.5–4.5)
Glucose: 234 mg/dL — ABNORMAL HIGH (ref 65–99)
Potassium: 4.2 mmol/L (ref 3.5–5.2)
Sodium: 138 mmol/L (ref 134–144)
Total Protein: 7.2 g/dL (ref 6.0–8.5)
eGFR: 11 mL/min/{1.73_m2} — ABNORMAL LOW (ref >59)

## 2021-04-02 LAB — CBC WITH DIFFERENTIAL/PLATELET
Basophils Absolute: 0 10*3/uL (ref 0.0–0.2)
Basos: 1 %
EOS (ABSOLUTE): 0.1 10*3/uL (ref 0.0–0.4)
Eos: 3 %
Hematocrit: 36.5 % (ref 34.0–46.6)
Hemoglobin: 10.8 g/dL — ABNORMAL LOW (ref 11.1–15.9)
Immature Grans (Abs): 0 10*3/uL (ref 0.0–0.1)
Immature Granulocytes: 0 %
Lymphocytes Absolute: 1.8 10*3/uL (ref 0.7–3.1)
Lymphs: 35 %
MCH: 23.3 pg — ABNORMAL LOW (ref 26.6–33.0)
MCHC: 29.6 g/dL — ABNORMAL LOW (ref 31.5–35.7)
MCV: 79 fL (ref 79–97)
Monocytes Absolute: 0.5 10*3/uL (ref 0.1–0.9)
Monocytes: 9 %
Neutrophils Absolute: 2.6 10*3/uL (ref 1.4–7.0)
Neutrophils: 52 %
Platelets: 205 10*3/uL (ref 150–450)
RBC: 4.64 x10E6/uL (ref 3.77–5.28)
RDW: 19.8 % — ABNORMAL HIGH (ref 11.7–15.4)
WBC: 5 10*3/uL (ref 3.4–10.8)

## 2021-04-02 LAB — LIPID PANEL
Chol/HDL Ratio: 2.6 ratio (ref 0.0–4.4)
Cholesterol, Total: 161 mg/dL (ref 100–199)
HDL: 63 mg/dL (ref >39)
LDL Chol Calc (NIH): 76 mg/dL (ref 0–99)
Triglycerides: 125 mg/dL (ref 0–149)
VLDL Cholesterol Cal: 22 mg/dL (ref 5–40)

## 2021-04-02 LAB — PREALBUMIN: PREALBUMIN: 33 mg/dL — ABNORMAL HIGH (ref 9–32)

## 2021-04-02 LAB — HEMOGLOBIN A1C
Est. average glucose Bld gHb Est-mCnc: 157 mg/dL
Hgb A1c MFr Bld: 7.1 % — ABNORMAL HIGH (ref 4.8–5.6)

## 2021-04-05 DIAGNOSIS — N2581 Secondary hyperparathyroidism of renal origin: Secondary | ICD-10-CM | POA: Diagnosis not present

## 2021-04-05 DIAGNOSIS — D509 Iron deficiency anemia, unspecified: Secondary | ICD-10-CM | POA: Diagnosis not present

## 2021-04-05 DIAGNOSIS — N186 End stage renal disease: Secondary | ICD-10-CM | POA: Diagnosis not present

## 2021-04-05 DIAGNOSIS — Z992 Dependence on renal dialysis: Secondary | ICD-10-CM | POA: Diagnosis not present

## 2021-04-07 DIAGNOSIS — Z992 Dependence on renal dialysis: Secondary | ICD-10-CM | POA: Diagnosis not present

## 2021-04-07 DIAGNOSIS — E1122 Type 2 diabetes mellitus with diabetic chronic kidney disease: Secondary | ICD-10-CM | POA: Diagnosis not present

## 2021-04-07 DIAGNOSIS — N186 End stage renal disease: Secondary | ICD-10-CM | POA: Diagnosis not present

## 2021-04-07 DIAGNOSIS — D509 Iron deficiency anemia, unspecified: Secondary | ICD-10-CM | POA: Diagnosis not present

## 2021-04-07 DIAGNOSIS — N2581 Secondary hyperparathyroidism of renal origin: Secondary | ICD-10-CM | POA: Diagnosis not present

## 2021-04-09 DIAGNOSIS — N2581 Secondary hyperparathyroidism of renal origin: Secondary | ICD-10-CM | POA: Diagnosis not present

## 2021-04-09 DIAGNOSIS — D509 Iron deficiency anemia, unspecified: Secondary | ICD-10-CM | POA: Diagnosis not present

## 2021-04-09 DIAGNOSIS — N186 End stage renal disease: Secondary | ICD-10-CM | POA: Diagnosis not present

## 2021-04-09 DIAGNOSIS — Z992 Dependence on renal dialysis: Secondary | ICD-10-CM | POA: Diagnosis not present

## 2021-04-12 DIAGNOSIS — N186 End stage renal disease: Secondary | ICD-10-CM | POA: Diagnosis not present

## 2021-04-12 DIAGNOSIS — D509 Iron deficiency anemia, unspecified: Secondary | ICD-10-CM | POA: Diagnosis not present

## 2021-04-12 DIAGNOSIS — Z992 Dependence on renal dialysis: Secondary | ICD-10-CM | POA: Diagnosis not present

## 2021-04-12 DIAGNOSIS — N2581 Secondary hyperparathyroidism of renal origin: Secondary | ICD-10-CM | POA: Diagnosis not present

## 2021-04-14 DIAGNOSIS — D509 Iron deficiency anemia, unspecified: Secondary | ICD-10-CM | POA: Diagnosis not present

## 2021-04-14 DIAGNOSIS — N2581 Secondary hyperparathyroidism of renal origin: Secondary | ICD-10-CM | POA: Diagnosis not present

## 2021-04-14 DIAGNOSIS — N186 End stage renal disease: Secondary | ICD-10-CM | POA: Diagnosis not present

## 2021-04-14 DIAGNOSIS — Z992 Dependence on renal dialysis: Secondary | ICD-10-CM | POA: Diagnosis not present

## 2021-04-14 DIAGNOSIS — U071 COVID-19: Secondary | ICD-10-CM | POA: Diagnosis not present

## 2021-04-15 ENCOUNTER — Other Ambulatory Visit: Payer: Self-pay | Admitting: Internal Medicine

## 2021-04-15 DIAGNOSIS — Z79899 Other long term (current) drug therapy: Secondary | ICD-10-CM

## 2021-04-16 DIAGNOSIS — Z992 Dependence on renal dialysis: Secondary | ICD-10-CM | POA: Diagnosis not present

## 2021-04-16 DIAGNOSIS — D509 Iron deficiency anemia, unspecified: Secondary | ICD-10-CM | POA: Diagnosis not present

## 2021-04-16 DIAGNOSIS — N186 End stage renal disease: Secondary | ICD-10-CM | POA: Diagnosis not present

## 2021-04-16 DIAGNOSIS — N2581 Secondary hyperparathyroidism of renal origin: Secondary | ICD-10-CM | POA: Diagnosis not present

## 2021-04-19 DIAGNOSIS — N186 End stage renal disease: Secondary | ICD-10-CM | POA: Diagnosis not present

## 2021-04-19 DIAGNOSIS — Z992 Dependence on renal dialysis: Secondary | ICD-10-CM | POA: Diagnosis not present

## 2021-04-19 DIAGNOSIS — N2581 Secondary hyperparathyroidism of renal origin: Secondary | ICD-10-CM | POA: Diagnosis not present

## 2021-04-21 DIAGNOSIS — Z992 Dependence on renal dialysis: Secondary | ICD-10-CM | POA: Diagnosis not present

## 2021-04-21 DIAGNOSIS — N186 End stage renal disease: Secondary | ICD-10-CM | POA: Diagnosis not present

## 2021-04-21 DIAGNOSIS — N2581 Secondary hyperparathyroidism of renal origin: Secondary | ICD-10-CM | POA: Diagnosis not present

## 2021-04-23 DIAGNOSIS — N186 End stage renal disease: Secondary | ICD-10-CM | POA: Diagnosis not present

## 2021-04-23 DIAGNOSIS — Z992 Dependence on renal dialysis: Secondary | ICD-10-CM | POA: Diagnosis not present

## 2021-04-23 DIAGNOSIS — N2581 Secondary hyperparathyroidism of renal origin: Secondary | ICD-10-CM | POA: Diagnosis not present

## 2021-04-26 DIAGNOSIS — N186 End stage renal disease: Secondary | ICD-10-CM | POA: Diagnosis not present

## 2021-04-26 DIAGNOSIS — N2581 Secondary hyperparathyroidism of renal origin: Secondary | ICD-10-CM | POA: Diagnosis not present

## 2021-04-26 DIAGNOSIS — Z992 Dependence on renal dialysis: Secondary | ICD-10-CM | POA: Diagnosis not present

## 2021-04-26 DIAGNOSIS — E1110 Type 2 diabetes mellitus with ketoacidosis without coma: Secondary | ICD-10-CM | POA: Diagnosis not present

## 2021-04-26 DIAGNOSIS — D509 Iron deficiency anemia, unspecified: Secondary | ICD-10-CM | POA: Diagnosis not present

## 2021-04-28 DIAGNOSIS — Z992 Dependence on renal dialysis: Secondary | ICD-10-CM | POA: Diagnosis not present

## 2021-04-28 DIAGNOSIS — D509 Iron deficiency anemia, unspecified: Secondary | ICD-10-CM | POA: Diagnosis not present

## 2021-04-28 DIAGNOSIS — N186 End stage renal disease: Secondary | ICD-10-CM | POA: Diagnosis not present

## 2021-04-28 DIAGNOSIS — N2581 Secondary hyperparathyroidism of renal origin: Secondary | ICD-10-CM | POA: Diagnosis not present

## 2021-04-28 DIAGNOSIS — E1110 Type 2 diabetes mellitus with ketoacidosis without coma: Secondary | ICD-10-CM | POA: Diagnosis not present

## 2021-04-30 DIAGNOSIS — D509 Iron deficiency anemia, unspecified: Secondary | ICD-10-CM | POA: Diagnosis not present

## 2021-04-30 DIAGNOSIS — Z992 Dependence on renal dialysis: Secondary | ICD-10-CM | POA: Diagnosis not present

## 2021-04-30 DIAGNOSIS — E1110 Type 2 diabetes mellitus with ketoacidosis without coma: Secondary | ICD-10-CM | POA: Diagnosis not present

## 2021-04-30 DIAGNOSIS — N186 End stage renal disease: Secondary | ICD-10-CM | POA: Diagnosis not present

## 2021-04-30 DIAGNOSIS — N2581 Secondary hyperparathyroidism of renal origin: Secondary | ICD-10-CM | POA: Diagnosis not present

## 2021-05-03 DIAGNOSIS — N2581 Secondary hyperparathyroidism of renal origin: Secondary | ICD-10-CM | POA: Diagnosis not present

## 2021-05-03 DIAGNOSIS — Z992 Dependence on renal dialysis: Secondary | ICD-10-CM | POA: Diagnosis not present

## 2021-05-03 DIAGNOSIS — N186 End stage renal disease: Secondary | ICD-10-CM | POA: Diagnosis not present

## 2021-05-05 DIAGNOSIS — Z992 Dependence on renal dialysis: Secondary | ICD-10-CM | POA: Diagnosis not present

## 2021-05-05 DIAGNOSIS — N186 End stage renal disease: Secondary | ICD-10-CM | POA: Diagnosis not present

## 2021-05-05 DIAGNOSIS — N2581 Secondary hyperparathyroidism of renal origin: Secondary | ICD-10-CM | POA: Diagnosis not present

## 2021-05-06 DIAGNOSIS — I871 Compression of vein: Secondary | ICD-10-CM | POA: Diagnosis not present

## 2021-05-06 DIAGNOSIS — Z992 Dependence on renal dialysis: Secondary | ICD-10-CM | POA: Diagnosis not present

## 2021-05-06 DIAGNOSIS — T82858A Stenosis of vascular prosthetic devices, implants and grafts, initial encounter: Secondary | ICD-10-CM | POA: Diagnosis not present

## 2021-05-06 DIAGNOSIS — N186 End stage renal disease: Secondary | ICD-10-CM | POA: Diagnosis not present

## 2021-05-07 DIAGNOSIS — N2581 Secondary hyperparathyroidism of renal origin: Secondary | ICD-10-CM | POA: Diagnosis not present

## 2021-05-07 DIAGNOSIS — N186 End stage renal disease: Secondary | ICD-10-CM | POA: Diagnosis not present

## 2021-05-07 DIAGNOSIS — E1122 Type 2 diabetes mellitus with diabetic chronic kidney disease: Secondary | ICD-10-CM | POA: Diagnosis not present

## 2021-05-07 DIAGNOSIS — Z992 Dependence on renal dialysis: Secondary | ICD-10-CM | POA: Diagnosis not present

## 2021-05-10 DIAGNOSIS — N186 End stage renal disease: Secondary | ICD-10-CM | POA: Diagnosis not present

## 2021-05-10 DIAGNOSIS — N2581 Secondary hyperparathyroidism of renal origin: Secondary | ICD-10-CM | POA: Diagnosis not present

## 2021-05-10 DIAGNOSIS — Z992 Dependence on renal dialysis: Secondary | ICD-10-CM | POA: Diagnosis not present

## 2021-05-12 DIAGNOSIS — N186 End stage renal disease: Secondary | ICD-10-CM | POA: Diagnosis not present

## 2021-05-12 DIAGNOSIS — Z992 Dependence on renal dialysis: Secondary | ICD-10-CM | POA: Diagnosis not present

## 2021-05-12 DIAGNOSIS — N2581 Secondary hyperparathyroidism of renal origin: Secondary | ICD-10-CM | POA: Diagnosis not present

## 2021-05-13 ENCOUNTER — Ambulatory Visit (INDEPENDENT_AMBULATORY_CARE_PROVIDER_SITE_OTHER): Payer: Medicare HMO

## 2021-05-13 VITALS — Ht 64.0 in | Wt 120.0 lb

## 2021-05-13 DIAGNOSIS — Z0000 Encounter for general adult medical examination without abnormal findings: Secondary | ICD-10-CM

## 2021-05-13 NOTE — Progress Notes (Signed)
I connected with [name] [name] today by telephone and verified that I am speaking with the correct person using two identifiers. Location patient: home Location provider: work Persons participating in the virtual visit: [name], [name].   I discussed the limitations, risks, security and privacy concerns of performing an evaluation and management service by telephone and the availability of in person appointments. I also discussed with the patient that there may be a patient responsible charge related to this service. The patient expressed understanding and verbally consented to this telephonic visit.    Interactive audio and video telecommunications were attempted between this provider and patient, however failed, due to patient having technical difficulties OR patient did not have access to video capability.  We continued and completed visit with audio only.     Vital signs may be patient reported or missing.  Subjective:   [name] [name] is a 85 y.o. female who presents for Medicare Annual (Subsequent) preventive examination.  Review of Systems     Cardiac Risk Factors include: advanced age (>17mn, >>72women);diabetes mellitus;dyslipidemia;hypertension;sedentary lifestyle     Objective:    Today's Vitals   05/13/21 1438  Weight: 120 lb (54.4 kg)  Height: '5\' 4"'  (1.626 m)   Body mass index is 20.6 kg/m.  Advanced Directives 05/13/2021 10/15/2020 07/25/2020 01/30/2020 01/24/2019 05/25/2015 11/22/2014  Does Patient Have a Medical Advance Directive? No Yes No No No No No;Yes  Type of Advance Directive - - - - - - HPress photographer Does patient want to make changes to medical advance directive? - No - Guardian declined - - - - No - Patient declined  Copy of HJeromein Chart? - - - - - - No - copy requested  Would patient like information on creating a medical advance directive? - - No - Patient declined No - Patient declined - -  -  Pre-existing out of facility DNR order (yellow form or pink MOST form) - - - - - - -    Current Medications (verified) Outpatient Encounter Medications as of 05/13/2021  Medication Sig   amLODipine (NORVASC) 10 MG tablet Take 10 mg by mouth daily.   ascorbic acid (VITAMIN C) 500 MG tablet Take 1 tablet (500 mg total) by mouth daily.   atorvastatin (LIPITOR) 20 MG tablet TAKE 1 TABLET(20 MG) BY MOUTH DAILY AT 6 PM FOR CHOLESTEROL   blood glucose meter kit and supplies KIT Dispense based on patient and insurance preference. Use up to four times daily as directed. (FOR ICD-9 250.00, 250.01).   cloNIDine (CATAPRES) 0.1 MG tablet TAKE 1 TABLET(0.1 MG) BY MOUTH THREE TIMES DAILY (Patient taking differently: Take 0.1 mg by mouth 3 (three) times daily.)   donepezil (ARICEPT) 5 MG tablet TAKE 1 TABLET(5 MG) BY MOUTH AT BEDTIME   ELIQUIS 2.5 MG TABS tablet TAKE 1 TABLET(2.5 MG) BY MOUTH TWICE DAILY   glucose blood test strip Use as directed to check blood sugars 3 times per day dx: e11.65   hydrALAZINE (APRESOLINE) 25 MG tablet TAKE 1 TABLET BY MOUTH THREE TIMES DAILY WITH FOOD   LEVEMIR FLEXTOUCH 100 UNIT/ML FlexTouch Pen INJECT 30 UNITS WITH BREAKFAST AND 15 UNITS BEFORE BEDTIME   loratadine (CLARITIN) 10 MG tablet Take 10 mg by mouth daily.   metoprolol tartrate (LOPRESSOR) 25 MG tablet TAKE 1/2 TABLET(12.5 MG) BY MOUTH TWICE DAILY (Patient taking differently: Take 12.5 mg by mouth 2 (two) times daily.)   oxyCODONE-acetaminophen (PERCOCET) 10-325 MG  tablet Take 1 tablet by mouth every 6 (six) hours as needed for pain.   oxyCODONE-acetaminophen (PERCOCET) 10-325 MG tablet Take 1 tablet by mouth every 6 (six) hours as needed for pain.   zinc sulfate 220 (50 Zn) MG capsule Take 1 capsule (220 mg total) by mouth daily.   No facility-administered encounter medications on file as of 05/13/2021.    Allergies (verified) Penicillins   History: Past Medical History:  Diagnosis Date   Arthritis    RA    CHF (congestive heart failure) (HCC)    CKD (chronic kidney disease) stage 3, GFR 30-59 ml/min (HCC)    M-W-F dialysis   Dementia (HCC)    Diabetes mellitus without complication (HCC)    insulin dependent   Hyperlipidemia    Hypertension    Seasonal allergies    Shortness of breath dyspnea    occasional with exertion - no oxygen   Sleep apnea    does not use cpap   TIA (transient ischemic attack) 2015   Past Surgical History:  Procedure Laterality Date   ABDOMINAL HYSTERECTOMY  1974   AV FISTULA PLACEMENT Left 12/10/2020   Procedure: INSERTION OF ARTERIOVENOUS (AV) GORE-TEX GRAFT LEFT ARM;  Surgeon: Serafina Mitchell, MD;  Location: [facility] OR;  Service: Vascular;  Laterality: Left;   COLONOSCOPY     EYE SURGERY Bilateral    cataracts removed   IR FLUORO GUIDE CV LINE RIGHT  07/29/2020   IR FLUORO GUIDE CV LINE RIGHT  08/04/2020   IR US GUIDE VASC ACCESS RIGHT  07/29/2020   MULTIPLE TOOTH EXTRACTIONS     Family History  Problem Relation Age of Onset   Diabetes type II Mother    Hypertension Mother    Diabetes type II Father    Hypertension Other    Hyperlipidemia Other    Stroke Other    Social History   Socioeconomic History   Marital status: Married    Spouse name: Not on file   Number of children: 6   Years of education: 12th   Highest education level: Not on file  Occupational History   Occupation: retired    Fish farm manager: RETIRED  Tobacco Use   Smoking status: Never   Smokeless tobacco: Never  Vaping Use   Vaping Use: Never used  Substance and Sexual Activity   Alcohol use: No    Alcohol/week: 0.0 standard drinks   Drug use: No   Sexual activity: Not Currently    Birth control/protection: Surgical    Comment: Hysterectomy  Other Topics Concern   Not on file  Social History Narrative   Patient lives with her husband    Patient is right handed   Patient drinks tea   Social Determinants of Health   Financial Resource Strain: Low Risk    Difficulty of  Paying Living Expenses: Not hard at all  Food Insecurity: No Food Insecurity   Worried About Charity fundraiser in the Last Year: Never true   Arboriculturist in the Last Year: Never true  Transportation Needs: No Transportation Needs   Lack of Transportation (Medical): No   Lack of Transportation (Non-Medical): No  Physical Activity: Inactive   Days of Exercise per Week: 0 days   Minutes of Exercise per Session: 0 min  Stress: No Stress Concern Present   Feeling of Stress : Not at all  Social Connections: Not on file    Tobacco Counseling Counseling given: Not Answered   Clinical Intake:  Pre-visit preparation completed: Yes  Pain : No/denies pain     Nutritional Status: BMI of 19-24  Normal Nutritional Risks: None Diabetes: Yes  How often do you need to have someone help you when you read instructions, pamphlets, or other written materials from your doctor or pharmacy?: 1 - Never  Diabetic? Yes Nutrition Risk Assessment:  Has the patient had any N/V/D within the last 2 months?  No  Does the patient have any non-healing wounds?  No  Has the patient had any unintentional weight loss or weight gain?  No   Diabetes:  Is the patient diabetic?  Yes  If diabetic, was a CBG obtained today?  No  Did the patient bring in their glucometer from home?  No  How often do you monitor your CBG's? daily.   Financial Strains and Diabetes Management:  Are you having any financial strains with the device, your supplies or your medication? No .  Does the patient want to be seen by Chronic Care Management for management of their diabetes?  No  Would the patient like to be referred to a Nutritionist or for Diabetic Management?  No   Diabetic Exams:  Diabetic Eye Exam: Completed 12/01/2020 Diabetic Foot Exam: Overdue, Pt has been advised about the importance in completing this exam. Pt is scheduled for diabetic foot exam on next appointment.   Interpreter Needed?:  No  Information entered by :: NAllen LPN   Activities of Daily Living In your present state of health, do you have any difficulty performing the following activities: 05/13/2021 12/10/2020  Hearing? N -  Vision? Y -  Difficulty concentrating or making decisions? N -  Walking or climbing stairs? N -  Dressing or bathing? N -  Doing errands, shopping? Tempie Donning  Comment family takes to appointment children run errands. Does not drive.  Preparing Food and eating ? N -  Using the Toilet? N -  In the past six months, have you accidently leaked urine? N -  Do you have problems with loss of bowel control? N -  Managing your Medications? Y -  Comment daughter sets up -  Managing your Finances? Y -  Comment daughter manages -  Housekeeping or managing your Housekeeping? N -  Some recent data might be hidden    Patient Care Team: [name], MD as PCP - General (Internal Medicine) [place], [place] any recent Medical Services you may have received from other than [facility] providers in the past year (date may be approximate).     Assessment:   This is a routine wellness examination for [name] [name].  Hearing/Vision screen Vision Screening - Comments:: Regular eye exams,   Dietary issues and exercise activities discussed: Current Exercise Habits: The patient does not participate in regular exercise at present   Goals Addressed             This Visit's Progress    Patient Stated       05/13/2021, no goals       Depression Screen PHQ 2/9 Scores 05/13/2021 04/01/2021 01/30/2020 01/24/2019 01/23/2019 09/27/2018 06/27/2018  PHQ - 2 Score 0 0 0 0 0 0 0  PHQ- 9 Score - 0 0 - - - -    Fall Risk Fall Risk  05/13/2021 04/01/2021 01/30/2020 01/24/2019 01/23/2019  Falls in the past year? 0 0 0 0 0  Number falls in past yr: - 0 - - -  Injury with Fall? - 0 - - -  Risk for fall due to : Medication side effect - Medication side effect Medication side effect -  Follow  up Falls evaluation completed;Education provided;Falls prevention discussed - Falls evaluation completed;Education provided;Falls prevention discussed Falls prevention discussed -    FALL RISK PREVENTION PERTAINING TO THE HOME:  Any stairs in or around the home? No  If so, are there any without handrails? N/a Home free of loose throw rugs in walkways, pet beds, electrical cords, etc? Yes  Adequate lighting in your home to reduce risk of falls? Yes   ASSISTIVE DEVICES UTILIZED TO PREVENT FALLS:  Life alert? No  Use of a cane, walker or w/c? Yes  Grab bars in the bathroom? No  Shower chair or bench in shower? Yes  Elevated toilet seat or a handicapped toilet? No   TIMED UP AND GO:  Was the test performed? No .      Cognitive Function:     6CIT Screen 09/24/2020 01/30/2020 01/24/2019  What Year? 4 points 0 points 0 points  What month? 3 points 0 points 0 points  What time? 3 points 0 points 0 points  Count back from 20 4 points 0 points 0 points  Months in reverse 4 points 4 points 0 points  Repeat phrase 0 points 6 points 0 points  Total Score 18 10 0    Immunizations Immunization History  Administered Date(s) Administered   DTaP 07/16/2012   Influenza, High Dose Seasonal PF 05/12/2021   Moderna Sars-Covid-2 Vaccination 08/28/2019, 09/25/2019, 06/22/2020   Pneumococcal Conjugate-13 12/26/2017   Pneumococcal-Unspecified 07/16/2012    TDAP status: Up to date  Flu Vaccine status: Up to date  Pneumococcal vaccine status: Up to date  Covid-19 vaccine status: Completed vaccines  Qualifies for Shingles Vaccine? Yes   Zostavax completed No   Shingrix Completed?: No.    Education has been provided regarding the importance of this vaccine. Patient has been advised to call insurance company to determine out of pocket expense if they have not yet received this vaccine. Advised may also receive vaccine at local pharmacy or Health Dept. Verbalized acceptance and  understanding.  Screening Tests Health Maintenance  Topic Date Due   Zoster Vaccines- Shingrix (1 of 2) Never done   DEXA SCAN  Never done   COVID-19 Vaccine (4 - Booster for Moderna series) 09/14/2020   HEMOGLOBIN A1C  10/02/2021   OPHTHALMOLOGY EXAM  12/01/2021   FOOT EXAM  04/01/2022   TETANUS/TDAP  07/16/2022   INFLUENZA VACCINE  Completed   HPV VACCINES  Aged Out    Health Maintenance  Health Maintenance Due  Topic Date Due   Zoster Vaccines- Shingrix (1 of 2) Never done   DEXA SCAN  Never done   COVID-19 Vaccine (4 - Booster for Moderna series) 09/14/2020    Colorectal cancer screening: No longer required.   Mammogram status: No longer required due to age.  Bone Density status: Ordered 04/04/2021. Pt provided with contact info and advised to call to schedule appt.  Lung Cancer Screening: (Low Dose CT Chest recommended if Age 68-80 years, 30 pack-year currently smoking OR have quit w/in 15years.) does not qualify.   Lung Cancer Screening Referral: no  Additional Screening:  Hepatitis C Screening: does not qualify;   Vision Screening: Recommended annual ophthalmology exams for early detection of glaucoma and other disorders of the eye. Is the patient up to date with their annual eye exam?  Yes  Who is the provider or what is the name of the  office in which the patient attends annual eye exams? Does not remember name If pt is not established with a provider, would they like to be referred to a provider to establish care? No .   Dental Screening: Recommended annual dental exams for proper oral hygiene  Community Resource Referral / Chronic Care Management: CRR required this visit?  No   CCM required this visit?  No      Plan:     I have personally reviewed and noted the following in the patient's chart:   Medical and social history Use of alcohol, tobacco or illicit drugs  Current medications and supplements including opioid prescriptions.  Functional  ability and status Nutritional status Physical activity Advanced directives List of other physicians Hospitalizations, surgeries, and ER visits in previous 12 months Vitals Screenings to include cognitive, depression, and falls Referrals and appointments  In addition, I have reviewed and discussed with patient certain preventive protocols, quality metrics, and best practice recommendations. A written personalized care plan for preventive services as well as general preventive health recommendations were provided to patient.     Kellie Simmering, LPN   [id number]   Nurse Notes: 6 CIT not administered. Patient has a history of having dementia.

## 2021-05-13 NOTE — Patient Instructions (Signed)
[name] [name] , Thank you for taking time to come for your Medicare Wellness Visit. I appreciate your ongoing commitment to your health goals. Please review the following plan we discussed and let me know if I can assist you in the future.   Screening recommendations/referrals: Colonoscopy: not required Mammogram: not required Bone Density: due Recommended yearly ophthalmology/optometry visit for glaucoma screening and checkup Recommended yearly dental visit for hygiene and checkup  Vaccinations: Influenza vaccine: completed 05/12/2021 Pneumococcal vaccine: completed 12/26/2017 Tdap vaccine: completed 07/16/2012, due 07/16/2022 Shingles vaccine: discussed   Covid-19: 06/22/2020, 09/25/2019, 08/28/2019  Advanced directives: Advance directive discussed with you today.   Conditions/risks identified: none  Next appointment: Follow up in one year for your annual wellness visit    Preventive Care 65 Years and Older, Female Preventive care refers to lifestyle choices and visits with your health care provider that can promote health and wellness. What does preventive care include? A yearly physical exam. This is also called an annual well check. Dental exams once or twice a year. Routine eye exams. Ask your health care provider how often you should have your eyes checked. Personal lifestyle choices, including: Daily care of your teeth and gums. Regular physical activity. Eating a healthy diet. Avoiding tobacco and drug use. Limiting alcohol use. Practicing safe sex. Taking low-dose aspirin every day. Taking vitamin and mineral supplements as recommended by your health care provider. What happens during an annual well check? The services and screenings done by your health care provider during your annual well check will depend on your age, overall health, lifestyle risk factors, and family history of disease. Counseling  Your health care provider may ask you questions about your: Alcohol  use. Tobacco use. Drug use. Emotional well-being. Home and relationship well-being. Sexual activity. Eating habits. History of falls. Memory and ability to understand (cognition). Work and work Statistician. Reproductive health. Screening  You may have the following tests or measurements: Height, weight, and BMI. Blood pressure. Lipid and cholesterol levels. These may be checked every 5 years, or more frequently if you are over 21 years old. Skin check. Lung cancer screening. You may have this screening every year starting at age 69 if you have a 30-pack-year history of smoking and currently smoke or have quit within the past 15 years. Fecal occult blood test (FOBT) of the stool. You may have this test every year starting at age 32. Flexible sigmoidoscopy or colonoscopy. You may have a sigmoidoscopy every 5 years or a colonoscopy every 10 years starting at age 36. Hepatitis C blood test. Hepatitis B blood test. Sexually transmitted disease (STD) testing. Diabetes screening. This is done by checking your blood sugar (glucose) after you have not eaten for a while (fasting). You may have this done every 1-3 years. Bone density scan. This is done to screen for osteoporosis. You may have this done starting at age 85. Mammogram. This may be done every 1-2 years. Talk to your health care provider about how often you should have regular mammograms. Talk with your health care provider about your test results, treatment options, and if necessary, the need for more tests. Vaccines  Your health care provider may recommend certain vaccines, such as: Influenza vaccine. This is recommended every year. Tetanus, diphtheria, and acellular pertussis (Tdap, Td) vaccine. You may need a Td booster every 10 years. Zoster vaccine. You may need this after age 64. Pneumococcal 13-valent conjugate (PCV13) vaccine. One dose is recommended after age 58. Pneumococcal polysaccharide (PPSV23) vaccine. One dose is  recommended after age 63. Talk to your health care provider about which screenings and vaccines you need and how often you need them. This information is not intended to replace advice given to you by your health care provider. Make sure you discuss any questions you have with your health care provider. Document Released: 08/21/2015 Document Revised: 04/13/2016 Document Reviewed: 05/26/2015 Elsevier Interactive Patient Education  2017 [place] Prevention in the Home Falls can cause injuries. They can happen to people of all ages. There are many things you can do to make your home safe and to help prevent falls. What can I do on the outside of my home? Regularly fix the edges of walkways and driveways and fix any cracks. Remove anything that might make you trip as you walk through a door, such as a raised step or threshold. Trim any bushes or trees on the path to your home. Use bright outdoor lighting. Clear any walking paths of anything that might make someone trip, such as rocks or tools. Regularly check to see if handrails are loose or broken. Make sure that both sides of any steps have handrails. Any raised decks and porches should have guardrails on the edges. Have any leaves, snow, or ice cleared regularly. Use sand or salt on walking paths during winter. Clean up any spills in your garage right away. This includes oil or grease spills. What can I do in the bathroom? Use night lights. Install grab bars by the toilet and in the tub and shower. Do not use towel bars as grab bars. Use non-skid mats or decals in the tub or shower. If you need to sit down in the shower, use a plastic, non-slip stool. Keep the floor dry. Clean up any water that spills on the floor as soon as it happens. Remove soap buildup in the tub or shower regularly. Attach bath mats securely with double-sided non-slip rug tape. Do not have throw rugs and other things on the floor that can make you  trip. What can I do in the bedroom? Use night lights. Make sure that you have a light by your bed that is easy to reach. Do not use any sheets or blankets that are too big for your bed. They should not hang down onto the floor. Have a firm chair that has side arms. You can use this for support while you get dressed. Do not have throw rugs and other things on the floor that can make you trip. What can I do in the kitchen? Clean up any spills right away. Avoid walking on wet floors. Keep items that you use a lot in easy-to-reach places. If you need to reach something above you, use a strong step stool that has a grab bar. Keep electrical cords out of the way. Do not use floor polish or wax that makes floors slippery. If you must use wax, use non-skid floor wax. Do not have throw rugs and other things on the floor that can make you trip. What can I do with my stairs? Do not leave any items on the stairs. Make sure that there are handrails on both sides of the stairs and use them. Fix handrails that are broken or loose. Make sure that handrails are as [place] as the stairways. Check any carpeting to make sure that it is firmly attached to the stairs. Fix any carpet that is loose or worn. Avoid having throw rugs at the top or bottom of the stairs. If you  do have throw rugs, attach them to the floor with carpet tape. Make sure that you have a light switch at the top of the stairs and the bottom of the stairs. If you do not have them, ask someone to add them for you. What else can I do to help prevent falls? Wear shoes that: Do not have high heels. Have rubber bottoms. Are comfortable and fit you well. Are closed at the toe. Do not wear sandals. If you use a stepladder: Make sure that it is fully opened. Do not climb a closed stepladder. Make sure that both sides of the stepladder are locked into place. Ask someone to hold it for you, if possible. Clearly mark and make sure that you can  see: Any grab bars or handrails. First and last steps. Where the edge of each step is. Use tools that help you move around (mobility aids) if they are needed. These include: Canes. Walkers. Scooters. Crutches. Turn on the lights when you go into a dark area. Replace any light bulbs as soon as they burn out. Set up your furniture so you have a clear path. Avoid moving your furniture around. If any of your floors are uneven, fix them. If there are any pets around you, be aware of where they are. Review your medicines with your doctor. Some medicines can make you feel dizzy. This can increase your chance of falling. Ask your doctor what other things that you can do to help prevent falls. This information is not intended to replace advice given to you by your health care provider. Make sure you discuss any questions you have with your health care provider. Document Released: 05/21/2009 Document Revised: 12/31/2015 Document Reviewed: 08/29/2014 Elsevier Interactive Patient Education  2017 [employer].

## 2021-05-14 DIAGNOSIS — N2581 Secondary hyperparathyroidism of renal origin: Secondary | ICD-10-CM | POA: Diagnosis not present

## 2021-05-14 DIAGNOSIS — Z992 Dependence on renal dialysis: Secondary | ICD-10-CM | POA: Diagnosis not present

## 2021-05-14 DIAGNOSIS — N186 End stage renal disease: Secondary | ICD-10-CM | POA: Diagnosis not present

## 2021-05-14 DIAGNOSIS — U071 COVID-19: Secondary | ICD-10-CM | POA: Diagnosis not present

## 2021-05-17 DIAGNOSIS — Z23 Encounter for immunization: Secondary | ICD-10-CM | POA: Diagnosis not present

## 2021-05-17 DIAGNOSIS — Z992 Dependence on renal dialysis: Secondary | ICD-10-CM | POA: Diagnosis not present

## 2021-05-17 DIAGNOSIS — N186 End stage renal disease: Secondary | ICD-10-CM | POA: Diagnosis not present

## 2021-05-17 DIAGNOSIS — N2581 Secondary hyperparathyroidism of renal origin: Secondary | ICD-10-CM | POA: Diagnosis not present

## 2021-05-19 DIAGNOSIS — N2581 Secondary hyperparathyroidism of renal origin: Secondary | ICD-10-CM | POA: Diagnosis not present

## 2021-05-19 DIAGNOSIS — Z23 Encounter for immunization: Secondary | ICD-10-CM | POA: Diagnosis not present

## 2021-05-19 DIAGNOSIS — Z992 Dependence on renal dialysis: Secondary | ICD-10-CM | POA: Diagnosis not present

## 2021-05-19 DIAGNOSIS — N186 End stage renal disease: Secondary | ICD-10-CM | POA: Diagnosis not present

## 2021-05-21 DIAGNOSIS — Z992 Dependence on renal dialysis: Secondary | ICD-10-CM | POA: Diagnosis not present

## 2021-05-21 DIAGNOSIS — Z23 Encounter for immunization: Secondary | ICD-10-CM | POA: Diagnosis not present

## 2021-05-21 DIAGNOSIS — N186 End stage renal disease: Secondary | ICD-10-CM | POA: Diagnosis not present

## 2021-05-21 DIAGNOSIS — N2581 Secondary hyperparathyroidism of renal origin: Secondary | ICD-10-CM | POA: Diagnosis not present

## 2021-05-24 DIAGNOSIS — E1110 Type 2 diabetes mellitus with ketoacidosis without coma: Secondary | ICD-10-CM | POA: Diagnosis not present

## 2021-05-24 DIAGNOSIS — N186 End stage renal disease: Secondary | ICD-10-CM | POA: Diagnosis not present

## 2021-05-24 DIAGNOSIS — Z992 Dependence on renal dialysis: Secondary | ICD-10-CM | POA: Diagnosis not present

## 2021-05-24 DIAGNOSIS — N2581 Secondary hyperparathyroidism of renal origin: Secondary | ICD-10-CM | POA: Diagnosis not present

## 2021-05-26 DIAGNOSIS — E1110 Type 2 diabetes mellitus with ketoacidosis without coma: Secondary | ICD-10-CM | POA: Diagnosis not present

## 2021-05-26 DIAGNOSIS — Z992 Dependence on renal dialysis: Secondary | ICD-10-CM | POA: Diagnosis not present

## 2021-05-26 DIAGNOSIS — N2581 Secondary hyperparathyroidism of renal origin: Secondary | ICD-10-CM | POA: Diagnosis not present

## 2021-05-26 DIAGNOSIS — N186 End stage renal disease: Secondary | ICD-10-CM | POA: Diagnosis not present

## 2021-05-28 DIAGNOSIS — N186 End stage renal disease: Secondary | ICD-10-CM | POA: Diagnosis not present

## 2021-05-28 DIAGNOSIS — E1110 Type 2 diabetes mellitus with ketoacidosis without coma: Secondary | ICD-10-CM | POA: Diagnosis not present

## 2021-05-28 DIAGNOSIS — N2581 Secondary hyperparathyroidism of renal origin: Secondary | ICD-10-CM | POA: Diagnosis not present

## 2021-05-28 DIAGNOSIS — Z992 Dependence on renal dialysis: Secondary | ICD-10-CM | POA: Diagnosis not present

## 2021-05-31 DIAGNOSIS — N2581 Secondary hyperparathyroidism of renal origin: Secondary | ICD-10-CM | POA: Diagnosis not present

## 2021-05-31 DIAGNOSIS — N186 End stage renal disease: Secondary | ICD-10-CM | POA: Diagnosis not present

## 2021-05-31 DIAGNOSIS — Z992 Dependence on renal dialysis: Secondary | ICD-10-CM | POA: Diagnosis not present

## 2021-06-02 DIAGNOSIS — N186 End stage renal disease: Secondary | ICD-10-CM | POA: Diagnosis not present

## 2021-06-02 DIAGNOSIS — N2581 Secondary hyperparathyroidism of renal origin: Secondary | ICD-10-CM | POA: Diagnosis not present

## 2021-06-02 DIAGNOSIS — Z992 Dependence on renal dialysis: Secondary | ICD-10-CM | POA: Diagnosis not present

## 2021-06-04 DIAGNOSIS — Z992 Dependence on renal dialysis: Secondary | ICD-10-CM | POA: Diagnosis not present

## 2021-06-04 DIAGNOSIS — N186 End stage renal disease: Secondary | ICD-10-CM | POA: Diagnosis not present

## 2021-06-04 DIAGNOSIS — N2581 Secondary hyperparathyroidism of renal origin: Secondary | ICD-10-CM | POA: Diagnosis not present

## 2021-06-07 DIAGNOSIS — Z992 Dependence on renal dialysis: Secondary | ICD-10-CM | POA: Diagnosis not present

## 2021-06-07 DIAGNOSIS — N186 End stage renal disease: Secondary | ICD-10-CM | POA: Diagnosis not present

## 2021-06-07 DIAGNOSIS — N2581 Secondary hyperparathyroidism of renal origin: Secondary | ICD-10-CM | POA: Diagnosis not present

## 2021-06-07 DIAGNOSIS — E1122 Type 2 diabetes mellitus with diabetic chronic kidney disease: Secondary | ICD-10-CM | POA: Diagnosis not present

## 2021-06-09 DIAGNOSIS — N186 End stage renal disease: Secondary | ICD-10-CM | POA: Diagnosis not present

## 2021-06-09 DIAGNOSIS — N2581 Secondary hyperparathyroidism of renal origin: Secondary | ICD-10-CM | POA: Diagnosis not present

## 2021-06-09 DIAGNOSIS — Z992 Dependence on renal dialysis: Secondary | ICD-10-CM | POA: Diagnosis not present

## 2021-06-10 ENCOUNTER — Other Ambulatory Visit: Payer: Self-pay | Admitting: Internal Medicine

## 2021-06-11 DIAGNOSIS — N186 End stage renal disease: Secondary | ICD-10-CM | POA: Diagnosis not present

## 2021-06-11 DIAGNOSIS — Z992 Dependence on renal dialysis: Secondary | ICD-10-CM | POA: Diagnosis not present

## 2021-06-11 DIAGNOSIS — N2581 Secondary hyperparathyroidism of renal origin: Secondary | ICD-10-CM | POA: Diagnosis not present

## 2021-06-14 ENCOUNTER — Other Ambulatory Visit: Payer: Self-pay

## 2021-06-14 DIAGNOSIS — N2581 Secondary hyperparathyroidism of renal origin: Secondary | ICD-10-CM | POA: Diagnosis not present

## 2021-06-14 DIAGNOSIS — Z992 Dependence on renal dialysis: Secondary | ICD-10-CM | POA: Diagnosis not present

## 2021-06-14 DIAGNOSIS — N186 End stage renal disease: Secondary | ICD-10-CM | POA: Diagnosis not present

## 2021-06-14 MED ORDER — APIXABAN 2.5 MG PO TABS: ORAL_TABLET | ORAL | 2 refills | Status: DC

## 2021-06-16 DIAGNOSIS — Z992 Dependence on renal dialysis: Secondary | ICD-10-CM | POA: Diagnosis not present

## 2021-06-16 DIAGNOSIS — N2581 Secondary hyperparathyroidism of renal origin: Secondary | ICD-10-CM | POA: Diagnosis not present

## 2021-06-16 DIAGNOSIS — N186 End stage renal disease: Secondary | ICD-10-CM | POA: Diagnosis not present

## 2021-06-17 ENCOUNTER — Telehealth: Payer: Self-pay

## 2021-06-17 NOTE — Chronic Care Management (AMB) (Signed)
    Chronic Care Management Pharmacy Assistant   Name: [name] [name]  MRN: [id number] DOB: 01-28-1936  Reason for Encounter: [facility] Pharmacogenetic Study Follow up   Patient agreed to participate in Pharmacogenetic Study and specimen was collected on 04/15/2021. Lab unable to process specimen and will send out another kit to patient to use. Called patient to inform, no answer, left message that when she receives kit from [facility] to please give me a call to assist with specimen collection in office.   Pattricia Boss, [place] Pharmacist Assistant [phone]

## 2021-06-18 DIAGNOSIS — N186 End stage renal disease: Secondary | ICD-10-CM | POA: Diagnosis not present

## 2021-06-18 DIAGNOSIS — Z992 Dependence on renal dialysis: Secondary | ICD-10-CM | POA: Diagnosis not present

## 2021-06-18 DIAGNOSIS — N2581 Secondary hyperparathyroidism of renal origin: Secondary | ICD-10-CM | POA: Diagnosis not present

## 2021-06-21 DIAGNOSIS — E1110 Type 2 diabetes mellitus with ketoacidosis without coma: Secondary | ICD-10-CM | POA: Diagnosis not present

## 2021-06-21 DIAGNOSIS — N186 End stage renal disease: Secondary | ICD-10-CM | POA: Diagnosis not present

## 2021-06-21 DIAGNOSIS — Z992 Dependence on renal dialysis: Secondary | ICD-10-CM | POA: Diagnosis not present

## 2021-06-21 DIAGNOSIS — N2581 Secondary hyperparathyroidism of renal origin: Secondary | ICD-10-CM | POA: Diagnosis not present

## 2021-06-23 DIAGNOSIS — N2581 Secondary hyperparathyroidism of renal origin: Secondary | ICD-10-CM | POA: Diagnosis not present

## 2021-06-23 DIAGNOSIS — Z992 Dependence on renal dialysis: Secondary | ICD-10-CM | POA: Diagnosis not present

## 2021-06-23 DIAGNOSIS — N186 End stage renal disease: Secondary | ICD-10-CM | POA: Diagnosis not present

## 2021-06-23 DIAGNOSIS — E1110 Type 2 diabetes mellitus with ketoacidosis without coma: Secondary | ICD-10-CM | POA: Diagnosis not present

## 2021-06-25 DIAGNOSIS — N2581 Secondary hyperparathyroidism of renal origin: Secondary | ICD-10-CM | POA: Diagnosis not present

## 2021-06-25 DIAGNOSIS — Z992 Dependence on renal dialysis: Secondary | ICD-10-CM | POA: Diagnosis not present

## 2021-06-25 DIAGNOSIS — E1110 Type 2 diabetes mellitus with ketoacidosis without coma: Secondary | ICD-10-CM | POA: Diagnosis not present

## 2021-06-25 DIAGNOSIS — N186 End stage renal disease: Secondary | ICD-10-CM | POA: Diagnosis not present

## 2021-06-28 DIAGNOSIS — N186 End stage renal disease: Secondary | ICD-10-CM | POA: Diagnosis not present

## 2021-06-28 DIAGNOSIS — Z992 Dependence on renal dialysis: Secondary | ICD-10-CM | POA: Diagnosis not present

## 2021-06-28 DIAGNOSIS — N2581 Secondary hyperparathyroidism of renal origin: Secondary | ICD-10-CM | POA: Diagnosis not present

## 2021-06-30 DIAGNOSIS — N186 End stage renal disease: Secondary | ICD-10-CM | POA: Diagnosis not present

## 2021-06-30 DIAGNOSIS — Z992 Dependence on renal dialysis: Secondary | ICD-10-CM | POA: Diagnosis not present

## 2021-06-30 DIAGNOSIS — N2581 Secondary hyperparathyroidism of renal origin: Secondary | ICD-10-CM | POA: Diagnosis not present

## 2021-07-03 DIAGNOSIS — N186 End stage renal disease: Secondary | ICD-10-CM | POA: Diagnosis not present

## 2021-07-03 DIAGNOSIS — Z992 Dependence on renal dialysis: Secondary | ICD-10-CM | POA: Diagnosis not present

## 2021-07-03 DIAGNOSIS — N2581 Secondary hyperparathyroidism of renal origin: Secondary | ICD-10-CM | POA: Diagnosis not present

## 2021-07-05 DIAGNOSIS — N2581 Secondary hyperparathyroidism of renal origin: Secondary | ICD-10-CM | POA: Diagnosis not present

## 2021-07-05 DIAGNOSIS — Z992 Dependence on renal dialysis: Secondary | ICD-10-CM | POA: Diagnosis not present

## 2021-07-05 DIAGNOSIS — N186 End stage renal disease: Secondary | ICD-10-CM | POA: Diagnosis not present

## 2021-07-07 DIAGNOSIS — N2581 Secondary hyperparathyroidism of renal origin: Secondary | ICD-10-CM | POA: Diagnosis not present

## 2021-07-07 DIAGNOSIS — N186 End stage renal disease: Secondary | ICD-10-CM | POA: Diagnosis not present

## 2021-07-07 DIAGNOSIS — Z992 Dependence on renal dialysis: Secondary | ICD-10-CM | POA: Diagnosis not present

## 2021-07-07 DIAGNOSIS — E1122 Type 2 diabetes mellitus with diabetic chronic kidney disease: Secondary | ICD-10-CM | POA: Diagnosis not present

## 2021-07-09 DIAGNOSIS — Z992 Dependence on renal dialysis: Secondary | ICD-10-CM | POA: Diagnosis not present

## 2021-07-09 DIAGNOSIS — N186 End stage renal disease: Secondary | ICD-10-CM | POA: Diagnosis not present

## 2021-07-09 DIAGNOSIS — N2581 Secondary hyperparathyroidism of renal origin: Secondary | ICD-10-CM | POA: Diagnosis not present

## 2021-07-12 DIAGNOSIS — N186 End stage renal disease: Secondary | ICD-10-CM | POA: Diagnosis not present

## 2021-07-12 DIAGNOSIS — N2581 Secondary hyperparathyroidism of renal origin: Secondary | ICD-10-CM | POA: Diagnosis not present

## 2021-07-12 DIAGNOSIS — Z992 Dependence on renal dialysis: Secondary | ICD-10-CM | POA: Diagnosis not present

## 2021-07-12 DIAGNOSIS — E1129 Type 2 diabetes mellitus with other diabetic kidney complication: Secondary | ICD-10-CM | POA: Diagnosis not present

## 2021-07-14 DIAGNOSIS — Z992 Dependence on renal dialysis: Secondary | ICD-10-CM | POA: Diagnosis not present

## 2021-07-14 DIAGNOSIS — N186 End stage renal disease: Secondary | ICD-10-CM | POA: Diagnosis not present

## 2021-07-14 DIAGNOSIS — N2581 Secondary hyperparathyroidism of renal origin: Secondary | ICD-10-CM | POA: Diagnosis not present

## 2021-07-16 DIAGNOSIS — N2581 Secondary hyperparathyroidism of renal origin: Secondary | ICD-10-CM | POA: Diagnosis not present

## 2021-07-16 DIAGNOSIS — N186 End stage renal disease: Secondary | ICD-10-CM | POA: Diagnosis not present

## 2021-07-16 DIAGNOSIS — Z992 Dependence on renal dialysis: Secondary | ICD-10-CM | POA: Diagnosis not present

## 2021-07-19 DIAGNOSIS — N186 End stage renal disease: Secondary | ICD-10-CM | POA: Diagnosis not present

## 2021-07-19 DIAGNOSIS — N2581 Secondary hyperparathyroidism of renal origin: Secondary | ICD-10-CM | POA: Diagnosis not present

## 2021-07-19 DIAGNOSIS — Z992 Dependence on renal dialysis: Secondary | ICD-10-CM | POA: Diagnosis not present

## 2021-07-21 ENCOUNTER — Emergency Department (HOSPITAL_COMMUNITY): Payer: Medicare HMO

## 2021-07-21 ENCOUNTER — Inpatient Hospital Stay (HOSPITAL_COMMUNITY)
Admission: EM | Admit: 2021-07-21 | Discharge: 2021-07-31 | DRG: 521 | Disposition: A | Discharge status: Home Health | Payer: Medicare HMO | Attending: Internal Medicine | Admitting: Internal Medicine

## 2021-07-21 ENCOUNTER — Other Ambulatory Visit: Payer: Self-pay

## 2021-07-21 DIAGNOSIS — N186 End stage renal disease: Secondary | ICD-10-CM | POA: Diagnosis not present

## 2021-07-21 DIAGNOSIS — I480 Paroxysmal atrial fibrillation: Secondary | ICD-10-CM | POA: Diagnosis present

## 2021-07-21 DIAGNOSIS — E1129 Type 2 diabetes mellitus with other diabetic kidney complication: Secondary | ICD-10-CM | POA: Diagnosis not present

## 2021-07-21 DIAGNOSIS — I5032 Chronic diastolic (congestive) heart failure: Secondary | ICD-10-CM | POA: Diagnosis present

## 2021-07-21 DIAGNOSIS — S72011A Unspecified intracapsular fracture of right femur, initial encounter for closed fracture: Secondary | ICD-10-CM | POA: Diagnosis not present

## 2021-07-21 DIAGNOSIS — Z992 Dependence on renal dialysis: Secondary | ICD-10-CM

## 2021-07-21 DIAGNOSIS — Z7901 Long term (current) use of anticoagulants: Secondary | ICD-10-CM

## 2021-07-21 DIAGNOSIS — E1165 Type 2 diabetes mellitus with hyperglycemia: Secondary | ICD-10-CM | POA: Diagnosis not present

## 2021-07-21 DIAGNOSIS — Z79899 Other long term (current) drug therapy: Secondary | ICD-10-CM

## 2021-07-21 DIAGNOSIS — D631 Anemia in chronic kidney disease: Secondary | ICD-10-CM | POA: Diagnosis present

## 2021-07-21 DIAGNOSIS — Z880 Allergy status to penicillin: Secondary | ICD-10-CM

## 2021-07-21 DIAGNOSIS — F05 Delirium due to known physiological condition: Secondary | ICD-10-CM | POA: Diagnosis not present

## 2021-07-21 DIAGNOSIS — G4733 Obstructive sleep apnea (adult) (pediatric): Secondary | ICD-10-CM | POA: Diagnosis not present

## 2021-07-21 DIAGNOSIS — M898X9 Other specified disorders of bone, unspecified site: Secondary | ICD-10-CM | POA: Diagnosis not present

## 2021-07-21 DIAGNOSIS — D62 Acute posthemorrhagic anemia: Secondary | ICD-10-CM | POA: Diagnosis not present

## 2021-07-21 DIAGNOSIS — W010XXA Fall on same level from slipping, tripping and stumbling without subsequent striking against object, initial encounter: Secondary | ICD-10-CM | POA: Diagnosis present

## 2021-07-21 DIAGNOSIS — Z20822 Contact with and (suspected) exposure to covid-19: Secondary | ICD-10-CM | POA: Diagnosis not present

## 2021-07-21 DIAGNOSIS — Z743 Need for continuous supervision: Secondary | ICD-10-CM | POA: Diagnosis not present

## 2021-07-21 DIAGNOSIS — S72001A Fracture of unspecified part of neck of right femur, initial encounter for closed fracture: Secondary | ICD-10-CM

## 2021-07-21 DIAGNOSIS — J302 Other seasonal allergic rhinitis: Secondary | ICD-10-CM | POA: Diagnosis not present

## 2021-07-21 DIAGNOSIS — Z794 Long term (current) use of insulin: Secondary | ICD-10-CM | POA: Diagnosis not present

## 2021-07-21 DIAGNOSIS — M1990 Unspecified osteoarthritis, unspecified site: Secondary | ICD-10-CM | POA: Diagnosis not present

## 2021-07-21 DIAGNOSIS — Z96641 Presence of right artificial hip joint: Secondary | ICD-10-CM

## 2021-07-21 DIAGNOSIS — S72009A Fracture of unspecified part of neck of unspecified femur, initial encounter for closed fracture: Secondary | ICD-10-CM

## 2021-07-21 DIAGNOSIS — G4730 Sleep apnea, unspecified: Secondary | ICD-10-CM | POA: Diagnosis not present

## 2021-07-21 DIAGNOSIS — N2581 Secondary hyperparathyroidism of renal origin: Secondary | ICD-10-CM | POA: Diagnosis present

## 2021-07-21 DIAGNOSIS — Z419 Encounter for procedure for purposes other than remedying health state, unspecified: Secondary | ICD-10-CM

## 2021-07-21 DIAGNOSIS — Z8249 Family history of ischemic heart disease and other diseases of the circulatory system: Secondary | ICD-10-CM | POA: Diagnosis not present

## 2021-07-21 DIAGNOSIS — S199XXA Unspecified injury of neck, initial encounter: Secondary | ICD-10-CM | POA: Diagnosis not present

## 2021-07-21 DIAGNOSIS — I10 Essential (primary) hypertension: Secondary | ICD-10-CM | POA: Diagnosis not present

## 2021-07-21 DIAGNOSIS — E1122 Type 2 diabetes mellitus with diabetic chronic kidney disease: Secondary | ICD-10-CM | POA: Diagnosis present

## 2021-07-21 DIAGNOSIS — R69 Illness, unspecified: Secondary | ICD-10-CM | POA: Diagnosis not present

## 2021-07-21 DIAGNOSIS — Z8616 Personal history of COVID-19: Secondary | ICD-10-CM

## 2021-07-21 DIAGNOSIS — I132 Hypertensive heart and chronic kidney disease with heart failure and with stage 5 chronic kidney disease, or end stage renal disease: Secondary | ICD-10-CM | POA: Diagnosis present

## 2021-07-21 DIAGNOSIS — W19XXXA Unspecified fall, initial encounter: Secondary | ICD-10-CM | POA: Diagnosis not present

## 2021-07-21 DIAGNOSIS — M25551 Pain in right hip: Secondary | ICD-10-CM | POA: Diagnosis not present

## 2021-07-21 DIAGNOSIS — S72041A Displaced fracture of base of neck of right femur, initial encounter for closed fracture: Secondary | ICD-10-CM | POA: Diagnosis not present

## 2021-07-21 DIAGNOSIS — N250 Renal osteodystrophy: Secondary | ICD-10-CM | POA: Diagnosis not present

## 2021-07-21 DIAGNOSIS — F03B0 Unspecified dementia, moderate, without behavioral disturbance, psychotic disturbance, mood disturbance, and anxiety: Secondary | ICD-10-CM | POA: Diagnosis present

## 2021-07-21 DIAGNOSIS — Z471 Aftercare following joint replacement surgery: Secondary | ICD-10-CM | POA: Diagnosis not present

## 2021-07-21 DIAGNOSIS — I509 Heart failure, unspecified: Secondary | ICD-10-CM | POA: Diagnosis not present

## 2021-07-21 DIAGNOSIS — E785 Hyperlipidemia, unspecified: Secondary | ICD-10-CM | POA: Diagnosis present

## 2021-07-21 DIAGNOSIS — S0003XA Contusion of scalp, initial encounter: Secondary | ICD-10-CM | POA: Diagnosis not present

## 2021-07-21 DIAGNOSIS — I959 Hypotension, unspecified: Secondary | ICD-10-CM | POA: Diagnosis not present

## 2021-07-21 DIAGNOSIS — Z8673 Personal history of transient ischemic attack (TIA), and cerebral infarction without residual deficits: Secondary | ICD-10-CM | POA: Diagnosis not present

## 2021-07-21 DIAGNOSIS — Z90710 Acquired absence of both cervix and uterus: Secondary | ICD-10-CM

## 2021-07-21 DIAGNOSIS — R102 Pelvic and perineal pain: Secondary | ICD-10-CM | POA: Diagnosis not present

## 2021-07-21 LAB — BASIC METABOLIC PANEL
Anion gap: 11 (ref 5–15)
BUN: 30 mg/dL — ABNORMAL HIGH (ref 8–23)
CO2: 27 mmol/L (ref 22–32)
Calcium: 9 mg/dL (ref 8.9–10.3)
Chloride: 97 mmol/L — ABNORMAL LOW (ref 98–111)
Creatinine, Ser: 4.98 mg/dL — ABNORMAL HIGH (ref 0.44–1.00)
GFR, Estimated: 8 mL/min — ABNORMAL LOW (ref >60)
Glucose, Bld: 168 mg/dL — ABNORMAL HIGH (ref 70–99)
Potassium: 3.8 mmol/L (ref 3.5–5.1)
Sodium: 135 mmol/L (ref 135–145)

## 2021-07-21 LAB — CBC WITH DIFFERENTIAL/PLATELET
Abs Immature Granulocytes: 0.04 10*3/uL (ref 0.00–0.07)
Basophils Absolute: 0 10*3/uL (ref 0.0–0.1)
Basophils Relative: 0 %
Eosinophils Absolute: 0 10*3/uL (ref 0.0–0.5)
Eosinophils Relative: 0 %
HCT: 36.9 % (ref 36.0–46.0)
Hemoglobin: 11.3 g/dL — ABNORMAL LOW (ref 12.0–15.0)
Immature Granulocytes: 0 %
Lymphocytes Relative: 10 %
Lymphs Abs: 1 10*3/uL (ref 0.7–4.0)
MCH: 23.5 pg — ABNORMAL LOW (ref 26.0–34.0)
MCHC: 30.6 g/dL (ref 30.0–36.0)
MCV: 76.9 fL — ABNORMAL LOW (ref 80.0–100.0)
Monocytes Absolute: 0.6 10*3/uL (ref 0.1–1.0)
Monocytes Relative: 6 %
Neutro Abs: 8.8 10*3/uL — ABNORMAL HIGH (ref 1.7–7.7)
Neutrophils Relative %: 84 %
Platelets: 234 10*3/uL (ref 150–400)
RBC: 4.8 MIL/uL (ref 3.87–5.11)
RDW: 18.2 % — ABNORMAL HIGH (ref 11.5–15.5)
WBC: 10.5 10*3/uL (ref 4.0–10.5)
nRBC: 0 % (ref 0.0–0.2)

## 2021-07-21 LAB — TYPE AND SCREEN
ABO/RH(D): B POS
Antibody Screen: NEGATIVE

## 2021-07-21 LAB — HEMOGLOBIN A1C
Hgb A1c MFr Bld: 6.4 % — ABNORMAL HIGH (ref 4.8–5.6)
Mean Plasma Glucose: 136.98 mg/dL

## 2021-07-21 LAB — RESP PANEL BY RT-PCR (FLU A&B, COVID) ARPGX2
Influenza A by PCR: NEGATIVE
Influenza B by PCR: NEGATIVE
SARS Coronavirus 2 by RT PCR: NEGATIVE

## 2021-07-21 LAB — CBG MONITORING, ED: Glucose-Capillary: 237 mg/dL — ABNORMAL HIGH (ref 70–99)

## 2021-07-21 MED ORDER — HEPARIN SODIUM (PORCINE) 5000 UNIT/ML IJ SOLN
5000.0000 [IU] | Freq: Two times a day (BID) | INTRAMUSCULAR | Status: DC
  Administered 2021-07-22 (×2): 5000 [IU] via SUBCUTANEOUS
  Filled 2021-07-21 (×2): qty 1

## 2021-07-21 MED ORDER — INSULIN ASPART 100 UNIT/ML IJ SOLN
0.0000 [IU] | Freq: Three times a day (TID) | INTRAMUSCULAR | Status: DC
  Administered 2021-07-22 – 2021-07-24 (×3): 1 [IU] via SUBCUTANEOUS
  Administered 2021-07-26 – 2021-07-27 (×2): 2 [IU] via SUBCUTANEOUS
  Administered 2021-07-27 – 2021-07-29 (×3): 1 [IU] via SUBCUTANEOUS

## 2021-07-21 MED ORDER — LORATADINE 10 MG PO TABS
10.0000 mg | ORAL_TABLET | Freq: Every day | ORAL | Status: DC
  Administered 2021-07-22 – 2021-07-31 (×8): 10 mg via ORAL
  Filled 2021-07-21 (×9): qty 1

## 2021-07-21 MED ORDER — OXYCODONE-ACETAMINOPHEN 10-325 MG PO TABS: 1.0000 | ORAL_TABLET | Freq: Four times a day (QID) | ORAL | Status: DC | PRN

## 2021-07-21 MED ORDER — OXYCODONE-ACETAMINOPHEN 5-325 MG PO TABS: 1.0000 | ORAL_TABLET | Freq: Four times a day (QID) | ORAL | Status: DC | PRN

## 2021-07-21 MED ORDER — SENNOSIDES-DOCUSATE SODIUM 8.6-50 MG PO TABS: 1.0000 | ORAL_TABLET | Freq: Every evening | ORAL | Status: DC | PRN

## 2021-07-21 MED ORDER — OXYCODONE HCL 5 MG PO TABS: 5.0000 mg | ORAL_TABLET | Freq: Four times a day (QID) | ORAL | Status: DC | PRN

## 2021-07-21 MED ORDER — HYDROMORPHONE HCL 1 MG/ML IJ SOLN: 0.5000 mg | INTRAMUSCULAR | Status: DC | PRN

## 2021-07-21 MED ORDER — AMLODIPINE BESYLATE 10 MG PO TABS
10.0000 mg | ORAL_TABLET | Freq: Every day | ORAL | Status: DC
  Administered 2021-07-22: 10 mg via ORAL
  Filled 2021-07-21: qty 1

## 2021-07-21 MED ORDER — BISACODYL 5 MG PO TBEC: 5.0000 mg | DELAYED_RELEASE_TABLET | Freq: Every day | ORAL | Status: DC | PRN

## 2021-07-21 MED ORDER — ATORVASTATIN CALCIUM 10 MG PO TABS
20.0000 mg | ORAL_TABLET | Freq: Every day | ORAL | Status: DC
  Administered 2021-07-21 – 2021-07-31 (×10): 20 mg via ORAL
  Filled 2021-07-21 (×10): qty 2

## 2021-07-21 MED ORDER — METOPROLOL TARTRATE 12.5 MG HALF TABLET
12.5000 mg | ORAL_TABLET | Freq: Two times a day (BID) | ORAL | Status: DC
  Administered 2021-07-21 – 2021-07-31 (×15): 12.5 mg via ORAL
  Filled 2021-07-21 (×19): qty 1

## 2021-07-21 MED ORDER — CLONIDINE HCL 0.1 MG PO TABS
0.1000 mg | ORAL_TABLET | Freq: Three times a day (TID) | ORAL | Status: DC
  Administered 2021-07-22 – 2021-07-23 (×4): 0.1 mg via ORAL
  Filled 2021-07-21 (×4): qty 1

## 2021-07-21 MED ORDER — HYDRALAZINE HCL 25 MG PO TABS
25.0000 mg | ORAL_TABLET | Freq: Three times a day (TID) | ORAL | Status: DC
  Administered 2021-07-22: 25 mg via ORAL
  Filled 2021-07-21: qty 1

## 2021-07-21 NOTE — ED Provider Notes (Signed)
[facility] EMERGENCY DEPARTMENT Provider Note   CSN: [id number] Arrival date & time: 07/21/21  1138     History Chief Complaint  Patient presents with   [name] [name] is a 85 y.o. female presenting for evaluation of right hip pain after fall.   Patient states [name] [name] was getting ready this morning to go to dialysis when [name] [name] tripped and fell, landing on her right hip.  [name] [name] landed on the floor.  [name] [name] did not hit her head or lose consciousness.  [name] [name] reports pain in the right hip, but no pain elsewhere.  [name] [name] has not been able to walk since due to her leg pain.  [name] [name] denies headache, chest pain, abdominal pain, back pain, numbness, or tingling.  [name] [name] has not taken anything for pain.  Pain is worse with movement, pain is minimal when [name] [name] is sitting still. History of ESRD on dialysis, goes Monday, Wednesday, Friday.  Last session was on Monday, [name] [name] was unable to get it today.  Additional history of dementia, CHF, hypertension, hyperlipidemia, TIA, A. fib on anticoagulation.  HPI     Past Medical History:  Diagnosis Date   Arthritis    RA   CHF (congestive heart failure) (HCC)    CKD (chronic kidney disease) stage 3, GFR 30-59 ml/min (HCC)    M-W-F dialysis   Dementia (HCC)    Diabetes mellitus without complication (HCC)    insulin dependent   Hyperlipidemia    Hypertension    Seasonal allergies    Shortness of breath dyspnea    occasional with exertion - no oxygen   Sleep apnea    does not use cpap   TIA (transient ischemic attack) 2015    Patient Active Problem List   Diagnosis Date Noted   Hip fracture ([place]) 07/21/2021   Thrombocytopenia ([place]) 08/12/2020   ESRD (end stage renal disease) ([place]) 08/12/2020   Delirium    Acute hypoxemic respiratory failure due to COVID-19 ([place]) 07/25/2020   CAP (community acquired pneumonia) 07/25/2020   CKD (chronic kidney disease), stage IV ([place]) 07/25/2020   AKI (acute kidney injury) ([place]) 07/25/2020    Sepsis ([place]) 07/25/2020   Atrial fibrillation with RVR ([name]) 07/25/2020   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 ([place]) 09/30/2018   Chronic idiopathic constipation 09/30/2018   Chronic diastolic HF (heart failure) ([place]) 05/26/2015   Type 2 diabetes mellitus with nephropathy ([place]) 05/26/2015   CHF (congestive heart failure) ([place]) 05/25/2015   Anasarca 05/25/2015   DM (diabetes mellitus), type 2 with renal complications ([place]) 02/72/5366   Chronic anemia 05/25/2015   Hypocalcemia    Encephalopathy, hypertensive 06/11/2014   Essential hypertension 06/11/2014   Type 2 diabetes mellitus without complication ([place]) 44/10/4740   HLD (hyperlipidemia) 06/11/2014   OSA (obstructive sleep apnea) 03/06/2014   Chronic renal disease, stage III ([place]) 01/13/2014   TIA (transient ischemic attack) 01/11/2014   Hypertensive urgency 01/11/2014   Diabetes mellitus ([place]) 01/11/2014    Past Surgical History:  Procedure Laterality Date   ABDOMINAL HYSTERECTOMY  1974   AV FISTULA PLACEMENT Left 12/10/2020   Procedure: INSERTION OF ARTERIOVENOUS (AV) GORE-TEX GRAFT LEFT ARM;  Surgeon: Serafina Mitchell, MD;  Location: [place];  Service: Vascular;  Laterality: Left;   COLONOSCOPY     EYE SURGERY Bilateral    cataracts removed   IR FLUORO GUIDE CV LINE RIGHT  07/29/2020   IR FLUORO GUIDE CV LINE RIGHT  08/04/2020   IR US  GUIDE VASC ACCESS RIGHT  07/29/2020   MULTIPLE TOOTH EXTRACTIONS       OB History   No obstetric history on file.     Family History  Problem Relation Age of Onset   Diabetes type II Mother    Hypertension Mother    Diabetes type II Father    Hypertension Other    Hyperlipidemia Other    Stroke Other     Social History   Tobacco Use   Smoking status: Never   Smokeless tobacco: Never  Vaping Use   Vaping Use: Never used  Substance Use Topics   Alcohol use: No    Alcohol/week: 0.0 standard drinks   Drug use: No    Home Medications Prior to Admission  medications   Medication Sig Start Date End Date Taking? Authorizing Provider  amLODipine (NORVASC) 10 MG tablet TAKE 1 TABLET(10 MG) BY MOUTH DAILY Patient taking differently: Take 10 mg by mouth daily. 06/10/21   [name], MD  apixaban (ELIQUIS) 2.5 MG TABS tablet TAKE 1 TABLET(2.5 MG) BY MOUTH TWICE DAILY Patient taking differently: Take 2.5 mg by mouth 2 (two) times daily. 06/14/21  Yes [name], MD  metoprolol tartrate (LOPRESSOR) 25 MG tablet TAKE 1/2 TABLET(12.5 MG) BY MOUTH TWICE DAILY Patient taking differently: Take 12.5 mg by mouth 2 (two) times daily. 06/10/21  Yes [name], MD  ascorbic acid (VITAMIN C) 500 MG tablet Take 1 tablet (500 mg total) by mouth daily. 08/15/20   Samuella Cota, MD  atorvastatin (LIPITOR) 20 MG tablet TAKE 1 TABLET(20 MG) BY MOUTH DAILY AT 6 PM FOR CHOLESTEROL Patient taking differently: Take 20 mg by mouth daily. 02/16/21   [name], MD  blood glucose meter kit and supplies KIT Dispense based on patient and insurance preference. Use up to four times daily as directed. (FOR ICD-9 250.00, 250.01). 04/01/17   Mackuen, Courteney Lyn, MD  cloNIDine (CATAPRES) 0.1 MG tablet TAKE 1 TABLET(0.1 MG) BY MOUTH THREE TIMES DAILY Patient taking differently: Take 0.1 mg by mouth 3 (three) times daily. 11/18/20   [name], MD  donepezil (ARICEPT) 5 MG tablet TAKE 1 TABLET(5 MG) BY MOUTH AT BEDTIME Patient taking differently: Take 5 mg by mouth at bedtime. 02/09/21   Minette Brine, FNP  glucose blood test strip Use as directed to check blood sugars 3 times per day dx: e11.65 Patient taking differently: 1 each by Other route See admin instructions. Use as directed to check blood sugars 3 times per day dx: e11.65 06/09/20   [name], MD  hydrALAZINE (APRESOLINE) 25 MG tablet TAKE 1 TABLET BY MOUTH THREE TIMES DAILY WITH FOOD Patient taking differently: Take 25 mg by mouth 3 (three) times daily. 11/18/20   [name], MD  LEVEMIR FLEXTOUCH 100  UNIT/ML FlexTouch Pen INJECT 30 UNITS WITH BREAKFAST AND 15 UNITS BEFORE BEDTIME 03/03/21   Ghumman, Ramandeep, NP  loratadine (CLARITIN) 10 MG tablet Take 10 mg by mouth daily.    [provider]  oxyCODONE-acetaminophen (PERCOCET) 10-325 MG tablet Take 1 tablet by mouth every 6 (six) hours as needed for pain. 12/10/20 12/10/21  Setzer, Edman Circle, PA-C  oxyCODONE-acetaminophen (PERCOCET) 10-325 MG tablet Take 1 tablet by mouth every 6 (six) hours as needed for pain. 12/10/20 12/10/21  Setzer, Edman Circle, PA-C  zinc sulfate 220 (50 Zn) MG capsule Take 1 capsule (220 mg total) by mouth daily. 08/15/20   Samuella Cota, MD    Allergies    Penicillins  Review  of Systems   Review of Systems  Musculoskeletal:  Positive for arthralgias.  Hematological:  Bruises/bleeds easily.  All other systems reviewed and are negative.  Physical Exam Updated Vital Signs BP (!) 145/69 (BP Location: Right Arm)    Pulse 82    Temp 98.8 F (37.1 C) (Oral)    Resp 16    SpO2 100%   Physical Exam Vitals and nursing note reviewed.  Constitutional:      General: [name] [name] is not in acute distress.    Appearance: Normal appearance.     Comments: Resting in the bed in NAD  HENT:     Head: Normocephalic and atraumatic.  Eyes:     Conjunctiva/sclera: Conjunctivae normal.     Pupils: Pupils are equal, round, and reactive to light.  Cardiovascular:     Rate and Rhythm: Normal rate and regular rhythm.     Pulses: Normal pulses.  Pulmonary:     Effort: Pulmonary effort is normal. No respiratory distress.     Breath sounds: Normal breath sounds. No wheezing.     Comments: Speaking in full sentences.  Clear lung sounds in all fields. Abdominal:     General: There is no distension.     Palpations: Abdomen is soft. There is no mass.     Tenderness: There is no abdominal tenderness. There is no guarding or rebound.  Musculoskeletal:     Cervical back: Normal range of motion and neck supple.     Comments: No obvious  deformity or tenderness. Pain with mvt of the leg or when going from laying to sitting. Pedal pulses 2+ bilaterally.   Skin:    General: Skin is warm and dry.     Capillary Refill: Capillary refill takes less than 2 seconds.  Neurological:     Mental Status: [name] [name] is alert and oriented to person, place, and time.  Psychiatric:        Mood and Affect: Mood and affect normal.        Speech: Speech normal.        Behavior: Behavior normal.    ED Results / Procedures / Treatments   Labs (all labs ordered are listed, but only abnormal results are displayed) Labs Reviewed  BASIC METABOLIC PANEL - Abnormal; Notable for the following components:      Result Value   Chloride 97 (*)    Glucose, Bld 168 (*)    BUN 30 (*)    Creatinine, Ser 4.98 (*)    GFR, Estimated 8 (*)    All other components within normal limits  CBC WITH DIFFERENTIAL/PLATELET - Abnormal; Notable for the following components:   Hemoglobin 11.3 (*)    MCV 76.9 (*)    MCH 23.5 (*)    RDW 18.2 (*)    Neutro Abs 8.8 (*)    All other components within normal limits  HEMOGLOBIN A1C - Abnormal; Notable for the following components:   Hgb A1c MFr Bld 6.4 (*)    All other components within normal limits  RESP PANEL BY RT-PCR (FLU A&B, COVID) ARPGX2  VITAMIN D 25 HYDROXY (VIT D DEFICIENCY, FRACTURES)  CBC  BASIC METABOLIC PANEL  TYPE AND SCREEN    EKG EKG Interpretation  Date/Time:  Wednesday July 21 2021 15:13:57 EST Ventricular Rate:  78 PR Interval:  169 QRS Duration: 86 QT Interval:  419 QTC Calculation: 478 R Axis:   9 Text Interpretation: Sinus rhythm Confirmed by Octaviano Glow [phone]) on 07/21/2021 3:51:09 PM  Radiology DG  Pelvis 1-2 Views  Result Date: 07/21/2021 CLINICAL DATA:  Right hip pain after fall. EXAM: PELVIS - 1-2 VIEW COMPARISON:  None. FINDINGS: Acute minimally impacted right subcapital femoral neck fracture. No dislocation. The pubic symphysis and sacroiliac joints are intact. The hip  joint spaces are preserved. Osteopenia. Soft tissues are unremarkable. IMPRESSION: 1. Acute minimally impacted right subcapital femoral neck fracture. Electronically Signed   By: Titus Dubin M.D.   On: 07/21/2021 14:11   CT Head Wo Contrast  Result Date: 07/21/2021 CLINICAL DATA:  Head trauma, fall EXAM: CT HEAD WITHOUT CONTRAST TECHNIQUE: Contiguous axial images were obtained from the base of the skull through the vertex without intravenous contrast. COMPARISON:  CT head examination dated November 19, 2020. FINDINGS: Brain: No evidence of acute infarction, hemorrhage, hydrocephalus, extra-axial collection or mass lesion/mass effect. Mild cerebral atrophy and chronic microvascular ischemic changes of the white matter, unchanged. Hypodensity in the right basal ganglia, which may represent lacunar infarct or ischemic changes. Vascular: Prominent atherosclerotic calcification of bilateral carotid siphons. Skull: Right high parietal scalp hematoma without evidence of calvarial fracture. Sinuses/Orbits: No acute finding. Other: None. IMPRESSION: 1.  No acute intracranial abnormality. 2. Right high parietal scalp hematoma without evidence of calvarial fracture. Electronically Signed   By: Keane Police D.O.   On: 07/21/2021 14:08   CT Cervical Spine Wo Contrast  Addendum Date: 07/21/2021   ADDENDUM REPORT: 07/21/2021 14:21 ADDENDUM: Following should be added to the impression. There is 1.7 cm low-density nodule in the left lobe of thyroid. When the patient's clinical condition permits, follow-up thyroid sonogram should be considered. Electronically Signed   By: Elmer Picker M.D.   On: 07/21/2021 14:21   Result Date: 07/21/2021 CLINICAL DATA:  Trauma trauma fall EXAM: CT CERVICAL SPINE WITHOUT CONTRAST TECHNIQUE: Multidetector CT imaging of the cervical spine was performed without intravenous contrast. Multiplanar CT image reconstructions were also generated. COMPARISON:  None. FINDINGS: Alignment:  Alignment of posterior margins of vertebral bodies is within normal limits. Skull base and vertebrae: No recent fracture is seen. Degenerative changes are noted with bony spurs and facet hypertrophy at multiple levels. Soft tissues and spinal canal: There is mild to moderate spinal stenosis at C5-C6 and C6-C7 levels. Disc levels: There is mild to moderate encroachment of neural foramina from C3-C7 levels. Upper chest: Visualized apical portions of lung fields are unremarkable. Other: There is inhomogeneous attenuation in thyroid. There is 1.7 cm low-density nodule in the left lobe. IMPRESSION: No recent fracture is seen in the cervical spine. Osteopenia. Cervical spondylosis. There is spinal stenosis at C5-C6 and C6-C7 levels. There is encroachment of neural foramina from C3-C7 levels. Electronically Signed: By: Elmer Picker M.D. On: 07/21/2021 14:13   CT Hip Right Wo Contrast  Result Date: 07/21/2021 CLINICAL DATA:  [place].  Right hip pain. EXAM: CT OF THE RIGHT HIP WITHOUT CONTRAST TECHNIQUE: Multidetector CT imaging of the right hip was performed according to the standard protocol. Multiplanar CT image reconstructions were also generated. COMPARISON:  Radiographs, same date. FINDINGS: There is a nondisplaced and minimally impacted subcapital fracture. The visualized right hemipelvic bony structures are intact. The right SI joint appears normal. No significant intrapelvic abnormalities are identified. IMPRESSION: Nondisplaced and minimally impacted subcapital fracture of the right hip. Electronically Signed   By: Marijo Sanes M.D.   On: 07/21/2021 18:14   DG FEMUR, MIN 2 VIEWS RIGHT  Result Date: 07/21/2021 CLINICAL DATA:  Fall, right hip pain EXAM: RIGHT FEMUR 2 VIEWS COMPARISON:  None. FINDINGS:  Cortical irregularity of the femoral head neck junction concerning for nondisplaced impaction fracture. IMPRESSION: Cortical irregularity of the femoral head and neck junction concerning for nondisplaced  impaction fracture. Further evaluation with CT or MRI examination would be helpful. Electronically Signed   By: Keane Police D.O.   On: 07/21/2021 14:15    Procedures Procedures   Medications Ordered in ED Medications  oxyCODONE-acetaminophen (PERCOCET) 10-325 MG per tablet 1 tablet (has no administration in time range)  amLODipine (NORVASC) tablet 10 mg (has no administration in time range)  atorvastatin (LIPITOR) tablet 20 mg (has no administration in time range)  cloNIDine (CATAPRES) tablet 0.1 mg (has no administration in time range)  hydrALAZINE (APRESOLINE) tablet 25 mg (has no administration in time range)  metoprolol tartrate (LOPRESSOR) tablet 12.5 mg (has no administration in time range)  loratadine (CLARITIN) tablet 10 mg (has no administration in time range)  heparin injection 5,000 Units (has no administration in time range)  HYDROmorphone (DILAUDID) injection 0.5 mg (has no administration in time range)  senna-docusate (Senokot-S) tablet 1 tablet (has no administration in time range)  bisacodyl (DULCOLAX) EC tablet 5 mg (has no administration in time range)  insulin aspart (novoLOG) injection 0-6 Units (has no administration in time range)    ED Course  I have reviewed the triage vital signs and the nursing notes.  Pertinent labs & imaging results that were available during my care of the patient were reviewed by me and considered in my medical decision making (see chart for details).  Clinical Course as of 07/21/21 1933  Wed Jul 21, 2021  1550 85 yo female w/ ESRD on diaylsis presenting with fall and hip fracture. Plan for admission, ortho evaluation.  Neurovascularly intact on exam.  Reviewed imaging and ECG - ECG normal per my interpretation, imaging with femoral neck fracture  [MT]    Clinical Course User Index [MT] Trifan, Carola Rhine, MD   MDM Rules/Calculators/A&P                           Patient presenting for evaluation of right hip pain after a fall.  On  exam, patient peers nontoxic.  [name] [name] is neurovascular intact.  Does not appear extremely uncomfortable, however does have pain with movement and has been unable to ambulate due to her pain.  X-rays obtained from triage viewed and independently interpreted by me, shows right hip fracture.  Does not show any other signs of injury.  CT head and neck negative for acute findings including bleed or trauma.  Labs are pending.  Patient will likely need to be admitted due to hip fracture.  Discussed with Hilbert Odor, PA-C from orthopedics who will evaluate the patient.  Patient will need medicine admit due to her multiple medical problems.  Discussed with Dr. Roosevelt Locks from [facility] hospitalist service, patient to be admitted   Final Clinical Impression(s) / ED Diagnoses Final diagnoses:  Closed fracture of right hip, initial encounter [facility])  Fall, initial encounter    Rx / DC Orders ED Discharge Orders     None        Franchot Heidelberg, PA-C 07/21/21 1933    Wyvonnia Dusky, MD 07/22/21 [id number]

## 2021-07-21 NOTE — Consult Note (Addendum)
Reason for Consult:Right hip fx Referring Physician: Octaviano Glow Time called: 1518 Time at bedside: [name] [name] is an 85 y.o. female.  HPI: [name] [name] was at home earlier today when she tripped and fell. She had immediate right hip pain. She had her grandson pick her up and put her in bed and then was brought to the ED. X-rays showed a right hip fx and orthopedic surgery was consulted. She lives alone though she does have several family staying with her for the holidays. She ambulates independently.  Past Medical History:  Diagnosis Date   Arthritis    RA   CHF (congestive heart failure) (HCC)    CKD (chronic kidney disease) stage 3, GFR 30-59 ml/min (HCC)    M-W-F dialysis   Dementia (HCC)    Diabetes mellitus without complication (HCC)    insulin dependent   Hyperlipidemia    Hypertension    Seasonal allergies    Shortness of breath dyspnea    occasional with exertion - no oxygen   Sleep apnea    does not use cpap   TIA (transient ischemic attack) 2015    Past Surgical History:  Procedure Laterality Date   ABDOMINAL HYSTERECTOMY  1974   AV FISTULA PLACEMENT Left 12/10/2020   Procedure: INSERTION OF ARTERIOVENOUS (AV) GORE-TEX GRAFT LEFT ARM;  Surgeon: Serafina Mitchell, MD;  Location: [facility] OR;  Service: Vascular;  Laterality: Left;   COLONOSCOPY     EYE SURGERY Bilateral    cataracts removed   IR FLUORO GUIDE CV LINE RIGHT  07/29/2020   IR FLUORO GUIDE CV LINE RIGHT  08/04/2020   IR US GUIDE VASC ACCESS RIGHT  07/29/2020   MULTIPLE TOOTH EXTRACTIONS      Family History  Problem Relation Age of Onset   Diabetes type II Mother    Hypertension Mother    Diabetes type II Father    Hypertension Other    Hyperlipidemia Other    Stroke Other     Social History:  reports that she has never smoked. She has never used smokeless tobacco. She reports that she does not drink alcohol and does not use drugs.  Allergies:  Allergies  Allergen Reactions    Penicillins Swelling and Rash    Medications: I have reviewed the patient's current medications.  No results found for this or any previous visit (from the past 48 hour(s)).  DG Pelvis 1-2 Views  Result Date: 07/21/2021 CLINICAL DATA:  Right hip pain after fall. EXAM: PELVIS - 1-2 VIEW COMPARISON:  None. FINDINGS: Acute minimally impacted right subcapital femoral neck fracture. No dislocation. The pubic symphysis and sacroiliac joints are intact. The hip joint spaces are preserved. Osteopenia. Soft tissues are unremarkable. IMPRESSION: 1. Acute minimally impacted right subcapital femoral neck fracture. Electronically Signed   By: Titus Dubin M.D.   On: 07/21/2021 14:11   CT Head Wo Contrast  Result Date: 07/21/2021 CLINICAL DATA:  Head trauma, fall EXAM: CT HEAD WITHOUT CONTRAST TECHNIQUE: Contiguous axial images were obtained from the base of the skull through the vertex without intravenous contrast. COMPARISON:  CT head examination dated November 19, 2020. FINDINGS: Brain: No evidence of acute infarction, hemorrhage, hydrocephalus, extra-axial collection or mass lesion/mass effect. Mild cerebral atrophy and chronic microvascular ischemic changes of the white matter, unchanged. Hypodensity in the right basal ganglia, which may represent lacunar infarct or ischemic changes. Vascular: Prominent atherosclerotic calcification of bilateral carotid siphons. Skull: Right high parietal scalp hematoma without evidence of calvarial  fracture. Sinuses/Orbits: No acute finding. Other: None. IMPRESSION: 1.  No acute intracranial abnormality. 2. Right high parietal scalp hematoma without evidence of calvarial fracture. Electronically Signed   By: Keane Police D.O.   On: 07/21/2021 14:08   CT Cervical Spine Wo Contrast  Addendum Date: 07/21/2021   ADDENDUM REPORT: 07/21/2021 14:21 ADDENDUM: Following should be added to the impression. There is 1.7 cm low-density nodule in the left lobe of thyroid. When the  patient's clinical condition permits, follow-up thyroid sonogram should be considered. Electronically Signed   By: Elmer Picker M.D.   On: 07/21/2021 14:21   Result Date: 07/21/2021 CLINICAL DATA:  Trauma trauma fall EXAM: CT CERVICAL SPINE WITHOUT CONTRAST TECHNIQUE: Multidetector CT imaging of the cervical spine was performed without intravenous contrast. Multiplanar CT image reconstructions were also generated. COMPARISON:  None. FINDINGS: Alignment: Alignment of posterior margins of vertebral bodies is within normal limits. Skull base and vertebrae: No recent fracture is seen. Degenerative changes are noted with bony spurs and facet hypertrophy at multiple levels. Soft tissues and spinal canal: There is mild to moderate spinal stenosis at C5-C6 and C6-C7 levels. Disc levels: There is mild to moderate encroachment of neural foramina from C3-C7 levels. Upper chest: Visualized apical portions of lung fields are unremarkable. Other: There is inhomogeneous attenuation in thyroid. There is 1.7 cm low-density nodule in the left lobe. IMPRESSION: No recent fracture is seen in the cervical spine. Osteopenia. Cervical spondylosis. There is spinal stenosis at C5-C6 and C6-C7 levels. There is encroachment of neural foramina from C3-C7 levels. Electronically Signed: By: Elmer Picker M.D. On: 07/21/2021 14:13   DG FEMUR, MIN 2 VIEWS RIGHT  Result Date: 07/21/2021 CLINICAL DATA:  Fall, right hip pain EXAM: RIGHT FEMUR 2 VIEWS COMPARISON:  None. FINDINGS: Cortical irregularity of the femoral head neck junction concerning for nondisplaced impaction fracture. IMPRESSION: Cortical irregularity of the femoral head and neck junction concerning for nondisplaced impaction fracture. Further evaluation with CT or MRI examination would be helpful. Electronically Signed   By: Keane Police D.O.   On: 07/21/2021 14:15    Review of Systems  HENT:  Negative for ear discharge, ear pain, hearing loss and tinnitus.    Eyes:  Negative for photophobia and pain.  Respiratory:  Negative for cough and shortness of breath.   Cardiovascular:  Negative for chest pain.  Gastrointestinal:  Negative for abdominal pain, nausea and vomiting.  Genitourinary:  Negative for dysuria, flank pain, frequency and urgency.  Musculoskeletal:  Positive for arthralgias (Right hip). Negative for back pain, myalgias and neck pain.  Neurological:  Negative for dizziness and headaches.  Hematological:  Does not bruise/bleed easily.  Psychiatric/Behavioral:  The patient is not nervous/anxious.   Blood pressure 119/68, pulse 78, temperature 98 F (36.7 C), temperature source Oral, resp. rate 16, SpO2 100 %. Physical Exam Constitutional:      General: She is not in acute distress.    Appearance: She is well-developed. She is not diaphoretic.  HENT:     Head: Normocephalic and atraumatic.  Eyes:     General: No scleral icterus.       Right eye: No discharge.        Left eye: No discharge.     Conjunctiva/sclera: Conjunctivae normal.  Cardiovascular:     Rate and Rhythm: Normal rate and regular rhythm.  Pulmonary:     Effort: Pulmonary effort is normal. No respiratory distress.  Musculoskeletal:     Cervical back: Normal range of motion.  Comments: RLE No traumatic wounds, ecchymosis, or rash  Mild TTP hip  No knee or ankle effusion  Knee stable to varus/ valgus and anterior/posterior stress  Sens DPN, SPN, TN intact  Motor EHL, ext, flex, evers 5/5  DP 1+, PT 1+, No significant edema  Skin:    General: Skin is warm and dry.  Neurological:     Mental Status: She is alert.  Psychiatric:        Mood and Affect: Mood normal.        Behavior: Behavior normal.    Assessment/Plan: A Fib: hold Eliquis, wait for surgery until Friday to allow wash out ESRD on HD: per medical teams Right hip fx -- Plan THA Friday by Dr. Lyla Glassing at [facility] Please keep NPO after MN Thursday night. Hold chemical DVT ppx.    Lisette Abu, PA-C Orthopedic Surgery [phone] 07/21/2021, 3:32 PM   Bertram Savin, MD

## 2021-07-21 NOTE — ED Triage Notes (Signed)
Patient arrived by [facility] from home following witnessed fall. Patient on eliquis and denies hitting head. Pain to right leg and unable to bear weight. Alert to baseline

## 2021-07-21 NOTE — H&P (Signed)
History and Physical    [name] CLX:[id number] DOB: 1936-08-07 DOA: 07/21/2021  PCP: Dorothyann Peng, MD (Confirm with patient/family/NH records and if not entered, this has to be entered at [facility] point of entry) Patient coming from: Home  I have personally briefly reviewed patient's old medical records in [facility]  Chief Complaint: Feeling ok  HPI: [name] [name] is a 85 y.o. female with medical history significant of ESRD on HD MWF, IIDM on diet control, HTN, chronic diastolic CHF, PAF on Eliquis, dementia, chronic ambulation dysfunction, came with mechanical fall and right hip pain.  Patient had a witnessed fall at home, but patient was standing up and turning around, family thought patient might turn around too fast this time, and fell on her right side.  Denied any loss of consciousness, no head injuries.  She hit her right hip and unable to stand up due to severe pain.  But she denied any prodrome such as lightheadedness nauseous vomiting or palpitations.  Last HD was on Friday, denied any shortness of breath or chest pain as of now.  ED Course: Vital signs stable, no hypotension no tachycardia.  Right hip x-ray imaging showed cortical irregularity of the femoral head and neck junction concerning for nondisplaced impacted fracture.  Review of Systems: As per HPI otherwise 14 point review of systems negative.    Past Medical History:  Diagnosis Date   Arthritis    RA   CHF (congestive heart failure) (HCC)    CKD (chronic kidney disease) stage 3, GFR 30-59 ml/min (HCC)    M-W-F dialysis   Dementia (HCC)    Diabetes mellitus without complication (HCC)    insulin dependent   Hyperlipidemia    Hypertension    Seasonal allergies    Shortness of breath dyspnea    occasional with exertion - no oxygen   Sleep apnea    does not use cpap   TIA (transient ischemic attack) 2015    Past Surgical History:  Procedure Laterality Date   ABDOMINAL  HYSTERECTOMY  1974   AV FISTULA PLACEMENT Left 12/10/2020   Procedure: INSERTION OF ARTERIOVENOUS (AV) GORE-TEX GRAFT LEFT ARM;  Surgeon: Nada Libman, MD;  Location: [facility] OR;  Service: Vascular;  Laterality: Left;   COLONOSCOPY     EYE SURGERY Bilateral    cataracts removed   IR FLUORO GUIDE CV LINE RIGHT  07/29/2020   IR FLUORO GUIDE CV LINE RIGHT  08/04/2020   IR US GUIDE VASC ACCESS RIGHT  07/29/2020   MULTIPLE TOOTH EXTRACTIONS       reports that she has never smoked. She has never used smokeless tobacco. She reports that she does not drink alcohol and does not use drugs.  Allergies  Allergen Reactions   Penicillins Swelling and Rash    Family History  Problem Relation Age of Onset   Diabetes type II Mother    Hypertension Mother    Diabetes type II Father    Hypertension Other    Hyperlipidemia Other    Stroke Other      Prior to Admission medications   Medication Sig Start Date End Date Taking? Authorizing Provider  amLODipine (NORVASC) 10 MG tablet TAKE 1 TABLET(10 MG) BY MOUTH DAILY 06/10/21   Dorothyann Peng, MD  metoprolol tartrate (LOPRESSOR) 25 MG tablet TAKE 1/2 TABLET(12.5 MG) BY MOUTH TWICE DAILY 06/10/21   Dorothyann Peng, MD  apixaban (ELIQUIS) 2.5 MG TABS tablet TAKE 1 TABLET(2.5 MG) BY MOUTH TWICE DAILY 06/14/21  [name], MD  ascorbic acid (VITAMIN C) 500 MG tablet Take 1 tablet (500 mg total) by mouth daily. 08/15/20   Samuella Cota, MD  atorvastatin (LIPITOR) 20 MG tablet TAKE 1 TABLET(20 MG) BY MOUTH DAILY AT 6 PM FOR CHOLESTEROL 02/16/21   [name], MD  blood glucose meter kit and supplies KIT Dispense based on patient and insurance preference. Use up to four times daily as directed. (FOR ICD-9 250.00, 250.01). 04/01/17   Mackuen, Courteney Lyn, MD  cloNIDine (CATAPRES) 0.1 MG tablet TAKE 1 TABLET(0.1 MG) BY MOUTH THREE TIMES DAILY Patient taking differently: Take 0.1 mg by mouth 3 (three) times daily. 11/18/20   [name], MD  donepezil  (ARICEPT) 5 MG tablet TAKE 1 TABLET(5 MG) BY MOUTH AT BEDTIME 02/09/21   Minette Brine, FNP  glucose blood test strip Use as directed to check blood sugars 3 times per day dx: e11.65 06/09/20   [name], MD  hydrALAZINE (APRESOLINE) 25 MG tablet TAKE 1 TABLET BY MOUTH THREE TIMES DAILY WITH FOOD 11/18/20   [name], MD  LEVEMIR FLEXTOUCH 100 UNIT/ML FlexTouch Pen INJECT 30 UNITS WITH BREAKFAST AND 15 UNITS BEFORE BEDTIME 03/03/21   Ghumman, Ramandeep, NP  loratadine (CLARITIN) 10 MG tablet Take 10 mg by mouth daily.    [provider]  oxyCODONE-acetaminophen (PERCOCET) 10-325 MG tablet Take 1 tablet by mouth every 6 (six) hours as needed for pain. 12/10/20 12/10/21  Setzer, Edman Circle, PA-C  oxyCODONE-acetaminophen (PERCOCET) 10-325 MG tablet Take 1 tablet by mouth every 6 (six) hours as needed for pain. 12/10/20 12/10/21  Setzer, Edman Circle, PA-C  zinc sulfate 220 (50 Zn) MG capsule Take 1 capsule (220 mg total) by mouth daily. 08/15/20   Samuella Cota, MD    Physical Exam: Vitals:   07/21/21 1600 07/21/21 1700 07/21/21 1726 07/21/21 1758  BP: 140/66 (!) 148/78 (!) 145/69   Pulse:   82   Resp: _0 Temp:    98.8 F (37.1 C)  TempSrc:    Oral  SpO2:   100%     Constitutional: NAD, calm, comfortable Vitals:   07/21/21 1600 07/21/21 1700 07/21/21 1726 07/21/21 1758  BP: 140/66 (!) 148/78 (!) 145/69   Pulse:   82   Resp: _1 Temp:    98.8 F (37.1 C)  TempSrc:    Oral  SpO2:   100%    Eyes: PERRL, lids and conjunctivae normal ENMT: Mucous membranes are moist. Posterior pharynx clear of any exudate or lesions.Normal dentition.  Neck: normal, supple, no masses, no thyromegaly Respiratory: clear to auscultation bilaterally, no wheezing, no crackles. Normal respiratory effort. No accessory muscle use.  Cardiovascular: Regular rate and rhythm, no murmurs / rubs / gallops. No extremity edema. 2+ pedal pulses. No carotid bruits.  Abdomen: no tenderness, no masses  palpated. No hepatosplenomegaly. Bowel sounds positive.  Musculoskeletal: Right hip tenderness, and right leg shortened and rotated. Skin: no rashes, lesions, ulcers. No induration Neurologic: CN 2-12 grossly intact. Sensation intact, DTR normal. Strength 5/5 in all 4.  Psychiatric: Normal judgment and insight. Alert and oriented x 3. Normal mood.     Labs on Admission: I have personally reviewed following labs and imaging studies  CBC: Recent Labs  Lab 07/21/21 1454  WBC 10.5  NEUTROABS 8.8*  HGB 11.3*  HCT 36.9  MCV 76.9*  PLT 644   Basic Metabolic Panel: Recent Labs  Lab 07/21/21 1454  NA 135  K 3.8  CL 97*  CO2 27  GLUCOSE 168*  BUN 30*  CREATININE 4.98*  CALCIUM 9.0   GFR: CrCl cannot be calculated (Unknown ideal weight.). Liver Function Tests: No results for input(s): AST, ALT, ALKPHOS, BILITOT, PROT, ALBUMIN in the last 168 hours. No results for input(s): LIPASE, AMYLASE in the last 168 hours. No results for input(s): AMMONIA in the last 168 hours. Coagulation Profile: No results for input(s): INR, PROTIME in the last 168 hours. Cardiac Enzymes: No results for input(s): CKTOTAL, CKMB, CKMBINDEX, TROPONINI in the last 168 hours. BNP (last 3 results) No results for input(s): PROBNP in the last 8760 hours. HbA1C: No results for input(s): HGBA1C in the last 72 hours. CBG: No results for input(s): GLUCAP in the last 168 hours. Lipid Profile: No results for input(s): CHOL, HDL, LDLCALC, TRIG, CHOLHDL, LDLDIRECT in the last 72 hours. Thyroid Function Tests: No results for input(s): TSH, T4TOTAL, FREET4, T3FREE, THYROIDAB in the last 72 hours. Anemia Panel: No results for input(s): VITAMINB12, FOLATE, FERRITIN, TIBC, IRON, RETICCTPCT in the last 72 hours. Urine analysis:    Component Value Date/Time   COLORURINE YELLOW 07/25/2020 [place] 07/25/2020 1747   LABSPEC 1.010 07/25/2020 1747   PHURINE 5.0 07/25/2020 1747   GLUCOSEU NEGATIVE  07/25/2020 1747   HGBUR SMALL (A) 07/25/2020 1747   BILIRUBINUR NEGATIVE 07/25/2020 1747   BILIRUBINUR negative 01/30/2020 [place] 07/25/2020 1747   PROTEINUR 100 (A) 07/25/2020 1747   UROBILINOGEN 0.2 01/30/2020 1652   UROBILINOGEN 0.2 05/25/2015 2112   NITRITE NEGATIVE 07/25/2020 1747   LEUKOCYTESUR NEGATIVE 07/25/2020 1747    Radiological Exams on Admission: DG Pelvis 1-2 Views  Result Date: 07/21/2021 CLINICAL DATA:  Right hip pain after fall. EXAM: PELVIS - 1-2 VIEW COMPARISON:  None. FINDINGS: Acute minimally impacted right subcapital femoral neck fracture. No dislocation. The pubic symphysis and sacroiliac joints are intact. The hip joint spaces are preserved. Osteopenia. Soft tissues are unremarkable. IMPRESSION: 1. Acute minimally impacted right subcapital femoral neck fracture. Electronically Signed   By: Titus Dubin M.D.   On: 07/21/2021 14:11   CT Head Wo Contrast  Result Date: 07/21/2021 CLINICAL DATA:  Head trauma, fall EXAM: CT HEAD WITHOUT CONTRAST TECHNIQUE: Contiguous axial images were obtained from the base of the skull through the vertex without intravenous contrast. COMPARISON:  CT head examination dated November 19, 2020. FINDINGS: Brain: No evidence of acute infarction, hemorrhage, hydrocephalus, extra-axial collection or mass lesion/mass effect. Mild cerebral atrophy and chronic microvascular ischemic changes of the white matter, unchanged. Hypodensity in the right basal ganglia, which may represent lacunar infarct or ischemic changes. Vascular: Prominent atherosclerotic calcification of bilateral carotid siphons. Skull: Right high parietal scalp hematoma without evidence of calvarial fracture. Sinuses/Orbits: No acute finding. Other: None. IMPRESSION: 1.  No acute intracranial abnormality. 2. Right high parietal scalp hematoma without evidence of calvarial fracture. Electronically Signed   By: Keane Police D.O.   On: 07/21/2021 14:08   CT Cervical  Spine Wo Contrast  Addendum Date: 07/21/2021   ADDENDUM REPORT: 07/21/2021 14:21 ADDENDUM: Following should be added to the impression. There is 1.7 cm low-density nodule in the left lobe of thyroid. When the patient's clinical condition permits, follow-up thyroid sonogram should be considered. Electronically Signed   By: Elmer Picker M.D.   On: 07/21/2021 14:21   Result Date: 07/21/2021 CLINICAL DATA:  Trauma trauma fall EXAM: CT CERVICAL SPINE WITHOUT CONTRAST TECHNIQUE: Multidetector CT imaging of the cervical spine  was performed without intravenous contrast. Multiplanar CT image reconstructions were also generated. COMPARISON:  None. FINDINGS: Alignment: Alignment of posterior margins of vertebral bodies is within normal limits. Skull base and vertebrae: No recent fracture is seen. Degenerative changes are noted with bony spurs and facet hypertrophy at multiple levels. Soft tissues and spinal canal: There is mild to moderate spinal stenosis at C5-C6 and C6-C7 levels. Disc levels: There is mild to moderate encroachment of neural foramina from C3-C7 levels. Upper chest: Visualized apical portions of lung fields are unremarkable. Other: There is inhomogeneous attenuation in thyroid. There is 1.7 cm low-density nodule in the left lobe. IMPRESSION: No recent fracture is seen in the cervical spine. Osteopenia. Cervical spondylosis. There is spinal stenosis at C5-C6 and C6-C7 levels. There is encroachment of neural foramina from C3-C7 levels. Electronically Signed: By: Elmer Picker M.D. On: 07/21/2021 14:13   DG FEMUR, MIN 2 VIEWS RIGHT  Result Date: 07/21/2021 CLINICAL DATA:  Fall, right hip pain EXAM: RIGHT FEMUR 2 VIEWS COMPARISON:  None. FINDINGS: Cortical irregularity of the femoral head neck junction concerning for nondisplaced impaction fracture. IMPRESSION: Cortical irregularity of the femoral head and neck junction concerning for nondisplaced impaction fracture. Further evaluation with  CT or MRI examination would be helpful. Electronically Signed   By: Keane Police D.O.   On: 07/21/2021 14:15    EKG: Independently reviewed.  Sinus rhythm, no acute PR or QTC interval changes.  Assessment/Plan Principal Problem:   Hip fracture ([place])  (please populate well all problems here in Problem List. (For example, if patient is on BP meds at home and you resume or decide to hold them, it is a problem that needs to be her. Same for CAD, COPD, HLD and so on)  Right hip fracture -Secondary to mechanical fall -As per orthopedic PA, plans for ORIF on [place], CT hip to follow -Given the bleeding risk of the fracture, will hold anticoagulation for now and start chemical DVT prophylaxis tomorrow. -Patient denied any recent chest pain shortness of breath, no history of stroke, DM but not on insulin, calculated 30 days for peri-operation cardiac risk 10%, medically cleared for right hip ORIF with acceptable cardiac risk.  ESRD on HD -Missed today's HD, D/W pharmacy attending, given there is no indication for emergency HD, likely will arrange HD tomorrow.  PAF -In the system, hold systemic anticoagulation, start chemical DVT prophylaxis tomorrow.  HTN -Stable, resume home BP medications including amlodipine, clonidine, hydralazine.  IDDM -On diet control, start sliding scale.  Dementia -Mentation at baseline, continue Aricept.  CHF, chronic diastolic -No acute issue.  DVT prophylaxis: Heparin subcu Code Status: Full code Family Communication: Daughter at bedside Disposition Plan: Expect more than 2 midnight hospital stay, likely will need SNF. Consults called: Orthopedic surgery Admission status: MedSurg admission   Lequita Halt MD [facility] Pager [phone]  07/21/2021, 6:03 PM

## 2021-07-21 NOTE — H&P (View-Only) (Signed)
Reason for Consult:Right hip fx Referring Physician: Octaviano Glow Time called: 1518 Time at bedside: [place] is an 85 y.o. female.  HPI: [name] [name] was at home earlier today when she tripped and fell. She had immediate right hip pain. She had her grandson pick her up and put her in bed and then was brought to the ED. X-rays showed a right hip fx and orthopedic surgery was consulted. She lives alone though she does have several family staying with her for the holidays. She ambulates independently.  Past Medical History:  Diagnosis Date   Arthritis    RA   CHF (congestive heart failure) (HCC)    CKD (chronic kidney disease) stage 3, GFR 30-59 ml/min (HCC)    M-W-F dialysis   Dementia (HCC)    Diabetes mellitus without complication (HCC)    insulin dependent   Hyperlipidemia    Hypertension    Seasonal allergies    Shortness of breath dyspnea    occasional with exertion - no oxygen   Sleep apnea    does not use cpap   TIA (transient ischemic attack) 2015    Past Surgical History:  Procedure Laterality Date   ABDOMINAL HYSTERECTOMY  1974   AV FISTULA PLACEMENT Left 12/10/2020   Procedure: INSERTION OF ARTERIOVENOUS (AV) GORE-TEX GRAFT LEFT ARM;  Surgeon: Serafina Mitchell, MD;  Location: [facility] OR;  Service: Vascular;  Laterality: Left;   COLONOSCOPY     EYE SURGERY Bilateral    cataracts removed   IR FLUORO GUIDE CV LINE RIGHT  07/29/2020   IR FLUORO GUIDE CV LINE RIGHT  08/04/2020   IR US GUIDE VASC ACCESS RIGHT  07/29/2020   MULTIPLE TOOTH EXTRACTIONS      Family History  Problem Relation Age of Onset   Diabetes type II Mother    Hypertension Mother    Diabetes type II Father    Hypertension Other    Hyperlipidemia Other    Stroke Other     Social History:  reports that she has never smoked. She has never used smokeless tobacco. She reports that she does not drink alcohol and does not use drugs.  Allergies:  Allergies  Allergen Reactions    Penicillins Swelling and Rash    Medications: I have reviewed the patient's current medications.  No results found for this or any previous visit (from the past 48 hour(s)).  DG Pelvis 1-2 Views  Result Date: 07/21/2021 CLINICAL DATA:  Right hip pain after fall. EXAM: PELVIS - 1-2 VIEW COMPARISON:  None. FINDINGS: Acute minimally impacted right subcapital femoral neck fracture. No dislocation. The pubic symphysis and sacroiliac joints are intact. The hip joint spaces are preserved. Osteopenia. Soft tissues are unremarkable. IMPRESSION: 1. Acute minimally impacted right subcapital femoral neck fracture. Electronically Signed   By: Titus Dubin M.D.   On: 07/21/2021 14:11   CT Head Wo Contrast  Result Date: 07/21/2021 CLINICAL DATA:  Head trauma, fall EXAM: CT HEAD WITHOUT CONTRAST TECHNIQUE: Contiguous axial images were obtained from the base of the skull through the vertex without intravenous contrast. COMPARISON:  CT head examination dated November 19, 2020. FINDINGS: Brain: No evidence of acute infarction, hemorrhage, hydrocephalus, extra-axial collection or mass lesion/mass effect. Mild cerebral atrophy and chronic microvascular ischemic changes of the white matter, unchanged. Hypodensity in the right basal ganglia, which may represent lacunar infarct or ischemic changes. Vascular: Prominent atherosclerotic calcification of bilateral carotid siphons. Skull: Right high parietal scalp hematoma without evidence of calvarial  fracture. Sinuses/Orbits: No acute finding. Other: None. IMPRESSION: 1.  No acute intracranial abnormality. 2. Right high parietal scalp hematoma without evidence of calvarial fracture. Electronically Signed   By: Keane Police D.O.   On: 07/21/2021 14:08   CT Cervical Spine Wo Contrast  Addendum Date: 07/21/2021   ADDENDUM REPORT: 07/21/2021 14:21 ADDENDUM: Following should be added to the impression. There is 1.7 cm low-density nodule in the left lobe of thyroid. When the  patient's clinical condition permits, follow-up thyroid sonogram should be considered. Electronically Signed   By: Elmer Picker M.D.   On: 07/21/2021 14:21   Result Date: 07/21/2021 CLINICAL DATA:  Trauma trauma fall EXAM: CT CERVICAL SPINE WITHOUT CONTRAST TECHNIQUE: Multidetector CT imaging of the cervical spine was performed without intravenous contrast. Multiplanar CT image reconstructions were also generated. COMPARISON:  None. FINDINGS: Alignment: Alignment of posterior margins of vertebral bodies is within normal limits. Skull base and vertebrae: No recent fracture is seen. Degenerative changes are noted with bony spurs and facet hypertrophy at multiple levels. Soft tissues and spinal canal: There is mild to moderate spinal stenosis at C5-C6 and C6-C7 levels. Disc levels: There is mild to moderate encroachment of neural foramina from C3-C7 levels. Upper chest: Visualized apical portions of lung fields are unremarkable. Other: There is inhomogeneous attenuation in thyroid. There is 1.7 cm low-density nodule in the left lobe. IMPRESSION: No recent fracture is seen in the cervical spine. Osteopenia. Cervical spondylosis. There is spinal stenosis at C5-C6 and C6-C7 levels. There is encroachment of neural foramina from C3-C7 levels. Electronically Signed: By: Elmer Picker M.D. On: 07/21/2021 14:13   DG FEMUR, MIN 2 VIEWS RIGHT  Result Date: 07/21/2021 CLINICAL DATA:  Fall, right hip pain EXAM: RIGHT FEMUR 2 VIEWS COMPARISON:  None. FINDINGS: Cortical irregularity of the femoral head neck junction concerning for nondisplaced impaction fracture. IMPRESSION: Cortical irregularity of the femoral head and neck junction concerning for nondisplaced impaction fracture. Further evaluation with CT or MRI examination would be helpful. Electronically Signed   By: Keane Police D.O.   On: 07/21/2021 14:15    Review of Systems  HENT:  Negative for ear discharge, ear pain, hearing loss and tinnitus.    Eyes:  Negative for photophobia and pain.  Respiratory:  Negative for cough and shortness of breath.   Cardiovascular:  Negative for chest pain.  Gastrointestinal:  Negative for abdominal pain, nausea and vomiting.  Genitourinary:  Negative for dysuria, flank pain, frequency and urgency.  Musculoskeletal:  Positive for arthralgias (Right hip). Negative for back pain, myalgias and neck pain.  Neurological:  Negative for dizziness and headaches.  Hematological:  Does not bruise/bleed easily.  Psychiatric/Behavioral:  The patient is not nervous/anxious.   Blood pressure 119/68, pulse 78, temperature 98 F (36.7 C), temperature source Oral, resp. rate 16, SpO2 100 %. Physical Exam Constitutional:      General: She is not in acute distress.    Appearance: She is well-developed. She is not diaphoretic.  HENT:     Head: Normocephalic and atraumatic.  Eyes:     General: No scleral icterus.       Right eye: No discharge.        Left eye: No discharge.     Conjunctiva/sclera: Conjunctivae normal.  Cardiovascular:     Rate and Rhythm: Normal rate and regular rhythm.  Pulmonary:     Effort: Pulmonary effort is normal. No respiratory distress.  Musculoskeletal:     Cervical back: Normal range of motion.  Comments: RLE No traumatic wounds, ecchymosis, or rash  Mild TTP hip  No knee or ankle effusion  Knee stable to varus/ valgus and anterior/posterior stress  Sens DPN, SPN, TN intact  Motor EHL, ext, flex, evers 5/5  DP 1+, PT 1+, No significant edema  Skin:    General: Skin is warm and dry.  Neurological:     Mental Status: She is alert.  Psychiatric:        Mood and Affect: Mood normal.        Behavior: Behavior normal.    Assessment/Plan: A Fib: hold Eliquis, wait for surgery until Friday to allow wash out ESRD on HD: per medical teams Right hip fx -- Plan THA Friday by Dr. Lyla Glassing at [facility]. Please keep NPO after MN Thursday night. Hold chemical DVT ppx.    Lisette Abu, PA-C Orthopedic Surgery [phone] 07/21/2021, 3:32 PM   Bertram Savin, MD

## 2021-07-21 NOTE — ED Provider Notes (Signed)
Emergency Medicine Provider Triage Evaluation Note  [name] [name] , a 85 y.o. female  was evaluated in triage.  Pt complains of fall.  She states that she had a nonsyncopal fall.  She does take Eliquis.  Reports compliance with all of her medications.  She does not think she hit her head however is not 100% sure. She reports pain in her right thigh and hip.  No fevers.  Was unable to get up off the floor her family was readily able to help her and she was not on the floor for prolonged period of time  Review of Systems  Positive: See above Negative: See above  Physical Exam  BP 119/68 (BP Location: Right Arm)    Pulse 78    Temp 98 F (36.7 C) (Oral)    Resp 16    SpO2 100%  Gen:   Awake, no distress   Resp:  Normal effort  MSK:   Moves extremities without difficulty, except for pain in the right thigh and hip with any movements.  Other:  Palpable DP/PT pulse.   Medical Decision Making  Medically screening exam initiated at 1:11 PM.  Appropriate orders placed.  [name] [name] was informed that the remainder of the evaluation will be completed by another provider, this initial triage assessment does not replace that evaluation, and the importance of remaining in the ED until their evaluation is complete.  Note: Portions of this report may have been transcribed using voice recognition software. Every effort was made to ensure accuracy; however, inadvertent computerized transcription errors may be present    Ollen Gross 07/21/21 1412    Valarie Merino, MD 07/23/21 [phone]

## 2021-07-22 ENCOUNTER — Encounter (HOSPITAL_COMMUNITY): Payer: Self-pay | Admitting: Internal Medicine

## 2021-07-22 LAB — GLUCOSE, CAPILLARY
Glucose-Capillary: 109 mg/dL — ABNORMAL HIGH (ref 70–99)
Glucose-Capillary: 198 mg/dL — ABNORMAL HIGH (ref 70–99)
Glucose-Capillary: 167 mg/dL — ABNORMAL HIGH (ref 70–99)

## 2021-07-22 LAB — CBC
HCT: 28.7 % — ABNORMAL LOW (ref 36.0–46.0)
Hemoglobin: 8.9 g/dL — ABNORMAL LOW (ref 12.0–15.0)
MCH: 23.7 pg — ABNORMAL LOW (ref 26.0–34.0)
MCHC: 31 g/dL (ref 30.0–36.0)
MCV: 76.3 fL — ABNORMAL LOW (ref 80.0–100.0)
Platelets: 183 10*3/uL (ref 150–400)
RBC: 3.76 MIL/uL — ABNORMAL LOW (ref 3.87–5.11)
RDW: 17.7 % — ABNORMAL HIGH (ref 11.5–15.5)
WBC: 6.3 10*3/uL (ref 4.0–10.5)
nRBC: 0 % (ref 0.0–0.2)

## 2021-07-22 LAB — BASIC METABOLIC PANEL
Anion gap: 6 (ref 5–15)
BUN: 33 mg/dL — ABNORMAL HIGH (ref 8–23)
CO2: 27 mmol/L (ref 22–32)
Calcium: 8.3 mg/dL — ABNORMAL LOW (ref 8.9–10.3)
Chloride: 103 mmol/L (ref 98–111)
Creatinine, Ser: 5.42 mg/dL — ABNORMAL HIGH (ref 0.44–1.00)
GFR, Estimated: 7 mL/min — ABNORMAL LOW (ref >60)
Glucose, Bld: 128 mg/dL — ABNORMAL HIGH (ref 70–99)
Potassium: 3.3 mmol/L — ABNORMAL LOW (ref 3.5–5.1)
Sodium: 136 mmol/L (ref 135–145)

## 2021-07-22 LAB — VITAMIN D 25 HYDROXY (VIT D DEFICIENCY, FRACTURES): Vit D, 25-Hydroxy: 34.61 ng/mL (ref 30–100)

## 2021-07-22 LAB — HEPATITIS B SURFACE ANTIGEN: Hepatitis B Surface Ag: NONREACTIVE

## 2021-07-22 LAB — CBG MONITORING, ED: Glucose-Capillary: 172 mg/dL — ABNORMAL HIGH (ref 70–99)

## 2021-07-22 LAB — HEPATITIS B SURFACE ANTIBODY,QUALITATIVE: Hep B S Ab: REACTIVE — AB

## 2021-07-22 MED ORDER — CHLORHEXIDINE GLUCONATE 4 % EX LIQD
60.0000 mL | Freq: Once | CUTANEOUS | Status: AC
  Administered 2021-07-23: 4 via TOPICAL
  Filled 2021-07-22: qty 15

## 2021-07-22 MED ORDER — POLYETHYLENE GLYCOL 3350 17 G PO PACK
17.0000 g | PACK | Freq: Two times a day (BID) | ORAL | Status: DC
Start: 2021-07-22 — End: 2021-07-22

## 2021-07-22 MED ORDER — POVIDONE-IODINE 10 % EX SWAB
2.0000 "application " | Freq: Once | CUTANEOUS | Status: AC
  Administered 2021-07-23: 2 via TOPICAL

## 2021-07-22 MED ORDER — LIDOCAINE-PRILOCAINE 2.5-2.5 % EX CREA: 1.0000 "application " | TOPICAL_CREAM | CUTANEOUS | Status: DC | PRN

## 2021-07-22 MED ORDER — ALTEPLASE 2 MG IJ SOLR: 2.0000 mg | Freq: Once | INTRAMUSCULAR | Status: DC | PRN

## 2021-07-22 MED ORDER — POLYETHYLENE GLYCOL 3350 17 G PO PACK
17.0000 g | PACK | Freq: Two times a day (BID) | ORAL | Status: AC
  Administered 2021-07-23 – 2021-07-24 (×2): 17 g via ORAL
  Filled 2021-07-22 (×4): qty 1

## 2021-07-22 MED ORDER — HEPARIN SODIUM (PORCINE) 1000 UNIT/ML DIALYSIS
1000.0000 [IU] | INTRAMUSCULAR | Status: DC | PRN
  Filled 2021-07-22: qty 1

## 2021-07-22 MED ORDER — SODIUM CHLORIDE 0.9 % IV SOLN: 100.0000 mL | INTRAVENOUS | Status: DC | PRN

## 2021-07-22 MED ORDER — PENTAFLUOROPROP-TETRAFLUOROETH EX AERO: 1.0000 "application " | INHALATION_SPRAY | CUTANEOUS | Status: DC | PRN

## 2021-07-22 MED ORDER — VANCOMYCIN HCL IN DEXTROSE 1-5 GM/200ML-% IV SOLN
1000.0000 mg | INTRAVENOUS | Status: AC
  Administered 2021-07-23: 1000 mg via INTRAVENOUS
  Filled 2021-07-22: qty 200

## 2021-07-22 MED ORDER — TRANEXAMIC ACID-NACL 1000-0.7 MG/100ML-% IV SOLN
1000.0000 mg | INTRAVENOUS | Status: AC
  Administered 2021-07-23: 1000 mg via INTRAVENOUS
  Filled 2021-07-22: qty 100

## 2021-07-22 MED ORDER — LIDOCAINE HCL (PF) 1 % IJ SOLN: 5.0000 mL | INTRAMUSCULAR | Status: DC | PRN

## 2021-07-22 NOTE — Progress Notes (Signed)
[facility] PROGRESS NOTE    Progress Note  [name] [name]  GEX:[id number] DOB: 1936-06-08 DOA: 07/21/2021 PCP: [name], MD     Brief Narrative:   [name] [name] is an 86 y.o. female past medical history significant for end-stage renal disease on hemodialysis,  diabetes mellitus type 2 diet controlled, essential hypertension, paroxysmal atrial fibrillation on Eliquis ambulatory dysfunction comes in with right hip pain after mechanical fall.  She dropped denies any prodromal symptoms   Assessment/Plan:   Right hip fracture (HCC) secondary to mechanical fall: CT of the hip showed nondisplaced impacted subcapital right hip fracture Therapeutic surgery was consulted and plan for ORIF on Friday. Eliquis stopped started on DVT prophylaxis. Risk and benefits were explained by admitting physician.  Patient would like to proceed with surgery.  End-stage renal disease on hemodialysis: Missed her dialysis today nephrology has been notified.  Paroxysmal atrial fibrillation: Holding systemic anticoagulation now on DVT prophylaxis. Resume metoprolol.  Essential hypertension: Continue amlodipine, clonidine, hydralazine and metoprolol.  Uncontrolled diabetes mellitus type 2: With an A1c of 6.4 continue sliding scale insulin.  Moderate dementia: Continue Aricept.  Chronic diastolic heart failure: Appears to be euvolemic fluid management per renal.    DVT prophylaxis: lovenox Family Communication:DAughter in the room seem to be bothered by the questions Status is: Inpatient  Remains inpatient appropriate because: Acute hip fracture        Code Status:     Code Status Orders  (From admission, onward)           Start     Ordered   07/21/21 1800  Full code  Continuous        07/21/21 1802           Code Status History     Date Active Date Inactive Code Status Order ID Comments User Context   07/25/2020 0936 08/14/2020 2142 Full Code  [id number]  Harold Hedge, MD ED   05/25/2015 2337 06/03/2015 1725 Full Code [id number]  Rise Patience, MD Inpatient   01/11/2014 0507 01/16/2014 1643 Full Code [id number]  Toy Baker, MD Inpatient         IV Access:   Peripheral IV   Procedures and diagnostic studies:   DG Pelvis 1-2 Views  Result Date: 07/21/2021 CLINICAL DATA:  Right hip pain after fall. EXAM: PELVIS - 1-2 VIEW COMPARISON:  None. FINDINGS: Acute minimally impacted right subcapital femoral neck fracture. No dislocation. The pubic symphysis and sacroiliac joints are intact. The hip joint spaces are preserved. Osteopenia. Soft tissues are unremarkable. IMPRESSION: 1. Acute minimally impacted right subcapital femoral neck fracture. Electronically Signed   By: Titus Dubin M.D.   On: 07/21/2021 14:11   CT Head Wo Contrast  Result Date: 07/21/2021 CLINICAL DATA:  Head trauma, fall EXAM: CT HEAD WITHOUT CONTRAST TECHNIQUE: Contiguous axial images were obtained from the base of the skull through the vertex without intravenous contrast. COMPARISON:  CT head examination dated November 19, 2020. FINDINGS: Brain: No evidence of acute infarction, hemorrhage, hydrocephalus, extra-axial collection or mass lesion/mass effect. Mild cerebral atrophy and chronic microvascular ischemic changes of the white matter, unchanged. Hypodensity in the right basal ganglia, which may represent lacunar infarct or ischemic changes. Vascular: Prominent atherosclerotic calcification of bilateral carotid siphons. Skull: Right high parietal scalp hematoma without evidence of calvarial fracture. Sinuses/Orbits: No acute finding. Other: None. IMPRESSION: 1.  No acute intracranial abnormality. 2. Right high parietal scalp hematoma without evidence of calvarial fracture. Electronically Signed   By:  Imran  Ahmed D.O.   On: 07/21/2021 14:08   CT Cervical Spine Wo Contrast  Addendum Date: 07/21/2021   ADDENDUM REPORT: 07/21/2021 14:21 ADDENDUM:  Following should be added to the impression. There is 1.7 cm low-density nodule in the left lobe of thyroid. When the patient's clinical condition permits, follow-up thyroid sonogram should be considered. Electronically Signed   By: Elmer Picker M.D.   On: 07/21/2021 14:21   Result Date: 07/21/2021 CLINICAL DATA:  Trauma trauma fall EXAM: CT CERVICAL SPINE WITHOUT CONTRAST TECHNIQUE: Multidetector CT imaging of the cervical spine was performed without intravenous contrast. Multiplanar CT image reconstructions were also generated. COMPARISON:  None. FINDINGS: Alignment: Alignment of posterior margins of vertebral bodies is within normal limits. Skull base and vertebrae: No recent fracture is seen. Degenerative changes are noted with bony spurs and facet hypertrophy at multiple levels. Soft tissues and spinal canal: There is mild to moderate spinal stenosis at C5-C6 and C6-C7 levels. Disc levels: There is mild to moderate encroachment of neural foramina from C3-C7 levels. Upper chest: Visualized apical portions of lung fields are unremarkable. Other: There is inhomogeneous attenuation in thyroid. There is 1.7 cm low-density nodule in the left lobe. IMPRESSION: No recent fracture is seen in the cervical spine. Osteopenia. Cervical spondylosis. There is spinal stenosis at C5-C6 and C6-C7 levels. There is encroachment of neural foramina from C3-C7 levels. Electronically Signed: By: Elmer Picker M.D. On: 07/21/2021 14:13   CT Hip Right Wo Contrast  Result Date: 07/21/2021 CLINICAL DATA:  [place].  Right hip pain. EXAM: CT OF THE RIGHT HIP WITHOUT CONTRAST TECHNIQUE: Multidetector CT imaging of the right hip was performed according to the standard protocol. Multiplanar CT image reconstructions were also generated. COMPARISON:  Radiographs, same date. FINDINGS: There is a nondisplaced and minimally impacted subcapital fracture. The visualized right hemipelvic bony structures are intact. The right SI joint  appears normal. No significant intrapelvic abnormalities are identified. IMPRESSION: Nondisplaced and minimally impacted subcapital fracture of the right hip. Electronically Signed   By: Marijo Sanes M.D.   On: 07/21/2021 18:14   DG FEMUR, MIN 2 VIEWS RIGHT  Result Date: 07/21/2021 CLINICAL DATA:  Fall, right hip pain EXAM: RIGHT FEMUR 2 VIEWS COMPARISON:  None. FINDINGS: Cortical irregularity of the femoral head neck junction concerning for nondisplaced impaction fracture. IMPRESSION: Cortical irregularity of the femoral head and neck junction concerning for nondisplaced impaction fracture. Further evaluation with CT or MRI examination would be helpful. Electronically Signed   By: Keane Police D.O.   On: 07/21/2021 14:15     Medical Consultants:   None.   Subjective:    [name] [name] confused  Objective:    Vitals:   07/22/21 0135 07/22/21 0430 07/22/21 0445 07/22/21 0600  BP: 137/63 (!) 115/52 122/60 (!) 129/56  Pulse: 83 76 85 77  Resp: 18 16  18   Temp:      TempSrc:      SpO2: 97% 96% 96% 98%   SpO2: 98 %  No intake or output data in the 24 hours ending 07/22/21 0646 There were no vitals filed for this visit.  Exam: General exam: In no acute distress. Respiratory system: Good air movement and clear to auscultation. Cardiovascular system: S1 & S2 heard, RRR. No JVD. Gastrointestinal system: Abdomen is nondistended, soft and nontender.  Extremities: No pedal edema. Skin: No rashes, lesions or ulcers   Data Reviewed:    Labs: Basic Metabolic Panel: Recent Labs  Lab 07/21/21 1454 07/22/21 0340  NA 135 136  K 3.8 3.3*  CL 97* 103  CO2 27 27  GLUCOSE 168* 128*  BUN 30* 33*  CREATININE 4.98* 5.42*  CALCIUM 9.0 8.3*   GFR CrCl cannot be calculated (Unknown ideal weight.). Liver Function Tests: No results for input(s): AST, ALT, ALKPHOS, BILITOT, PROT, ALBUMIN in the last 168 hours. No results for input(s): LIPASE, AMYLASE in the last 168  hours. No results for input(s): AMMONIA in the last 168 hours. Coagulation profile No results for input(s): INR, PROTIME in the last 168 hours. COVID-19 Labs  No results for input(s): DDIMER, FERRITIN, LDH, CRP in the last 72 hours.  Lab Results  Component Value Date   SARSCOV2NAA NEGATIVE 07/21/2021   SARSCOV2NAA NEGATIVE 12/08/2020   SARSCOV2NAA POSITIVE (A) 07/25/2020    CBC: Recent Labs  Lab 07/21/21 1454 07/22/21 0340  WBC 10.5 6.3  NEUTROABS 8.8*  --   HGB 11.3* 8.9*  HCT 36.9 28.7*  MCV 76.9* 76.3*  PLT 234 183   Cardiac Enzymes: No results for input(s): CKTOTAL, CKMB, CKMBINDEX, TROPONINI in the last 168 hours. BNP (last 3 results) No results for input(s): PROBNP in the last 8760 hours. CBG: Recent Labs  Lab 07/21/21 2153  GLUCAP 237*   D-Dimer: No results for input(s): DDIMER in the last 72 hours. Hgb A1c: Recent Labs    07/21/21 1804  HGBA1C 6.4*   Lipid Profile: No results for input(s): CHOL, HDL, LDLCALC, TRIG, CHOLHDL, LDLDIRECT in the last 72 hours. Thyroid function studies: No results for input(s): TSH, T4TOTAL, T3FREE, THYROIDAB in the last 72 hours.  Invalid input(s): FREET3 Anemia work up: No results for input(s): VITAMINB12, FOLATE, FERRITIN, TIBC, IRON, RETICCTPCT in the last 72 hours. Sepsis Labs: Recent Labs  Lab 07/21/21 1454 07/22/21 0340  WBC 10.5 6.3   Microbiology Recent Results (from the past 240 hour(s))  Resp Panel by RT-PCR (Flu A&B, Covid) Nasopharyngeal Swab     Status: None   Collection Time: 07/21/21  3:21 PM   Specimen: Nasopharyngeal Swab; Nasopharyngeal(NP) swabs in vial transport medium  Result Value Ref Range Status   SARS Coronavirus 2 by RT PCR NEGATIVE NEGATIVE Final    Comment: (NOTE) SARS-CoV-2 target nucleic acids are NOT DETECTED.  The SARS-CoV-2 RNA is generally detectable in upper respiratory specimens during the acute phase of infection. The lowest concentration of SARS-CoV-2 viral copies this  assay can detect is 138 copies/mL. A negative result does not preclude SARS-Cov-2 infection and should not be used as the sole basis for treatment or other patient management decisions. A negative result may occur with  improper specimen collection/handling, submission of specimen other than nasopharyngeal swab, presence of viral mutation(s) within the areas targeted by this assay, and inadequate number of viral copies(<138 copies/mL). A negative result must be combined with clinical observations, patient history, and epidemiological information. The expected result is Negative.  Fact Sheet for Patients:  EntrepreneurPulse.com.au  Fact Sheet for Healthcare Providers:  IncredibleEmployment.be  This test is no t yet approved or cleared by the [place] FDA and  has been authorized for detection and/or diagnosis of SARS-CoV-2 by FDA under an Emergency Use Authorization (EUA). This EUA will remain  in effect (meaning this test can be used) for the duration of the COVID-19 declaration under Section 564(b)(1) of the Act, 21 U.S.C.section 360bbb-3(b)(1), unless the authorization is terminated  or revoked sooner.       Influenza A by PCR NEGATIVE NEGATIVE Final   Influenza B by PCR NEGATIVE NEGATIVE  Final    Comment: (NOTE) The Xpert Xpress SARS-CoV-2/FLU/RSV plus assay is intended as an aid in the diagnosis of influenza from Nasopharyngeal swab specimens and should not be used as a sole basis for treatment. Nasal washings and aspirates are unacceptable for Xpert Xpress SARS-CoV-2/FLU/RSV testing.  Fact Sheet for Patients: EntrepreneurPulse.com.au  Fact Sheet for Healthcare Providers: IncredibleEmployment.be  This test is not yet approved or cleared by the [place] FDA and has been authorized for detection and/or diagnosis of SARS-CoV-2 by FDA under an Emergency Use Authorization (EUA). This EUA will  remain in effect (meaning this test can be used) for the duration of the COVID-19 declaration under Section 564(b)(1) of the Act, 21 U.S.C. section 360bbb-3(b)(1), unless the authorization is terminated or revoked.  Performed at [facility], [place] [address] [place], [place] [address]      Medications:    amLODipine  10 mg Oral Daily   atorvastatin  20 mg Oral Daily   cloNIDine  0.1 mg Oral TID   heparin  5,000 Units Subcutaneous Q12H   hydrALAZINE  25 mg Oral TID WC   insulin aspart  0-6 Units Subcutaneous TID WC   loratadine  10 mg Oral Daily   metoprolol tartrate  12.5 mg Oral BID   Continuous Infusions:    LOS: 1 day   Charlynne Cousins  [facility]  07/22/2021, 6:46 AM

## 2021-07-22 NOTE — ED Notes (Signed)
ED TO INPATIENT HANDOFF REPORT  ED Nurse Name and Phone #: Baxter Flattery, RN [phone]  S Name/Age/Gender [name] [name] 85 y.o. female Room/Bed: 039C/039C  Code Status   Code Status: Full Code  Home/SNF/Other Home Patient oriented to: self, place, time, and situation Is this baseline? Yes   Triage Complete: Triage complete  Chief Complaint Hip fracture [facility]) [S72.009A]  Triage Note Patient arrived by [facility] from home following witnessed fall. Patient on eliquis and denies hitting head. Pain to right leg and unable to bear weight. Alert to baseline   Allergies Allergies  Allergen Reactions   Penicillins Swelling and Rash    Level of Care/Admitting Diagnosis ED Disposition     ED Disposition  Admit   Condition  --   Comment  Hospital Area: [place] [[id number]]  Level of Care: Med-Surg [16]  May admit patient to [facility] or [facility] if equivalent level of care is available:: No  Covid Evaluation: Asymptomatic Screening Protocol (No Symptoms)  Diagnosis: Hip fracture [facility]) [[id number]]  Admitting Physician: Lequita Halt [[id number]]  Attending Physician: Lequita Halt [[id number]]  Estimated length of stay: 3 - 4 days  Certification:: I certify this patient will need inpatient services for at least 2 midnights          B Medical/Surgery History Past Medical History:  Diagnosis Date   Arthritis    RA   CHF (congestive heart failure) ([place])    CKD (chronic kidney disease) stage 3, GFR 30-59 ml/min ([place])    M-W-F dialysis   Dementia ([place])    Diabetes mellitus without complication (HCC)    insulin dependent   Hyperlipidemia    Hypertension    Seasonal allergies    Shortness of breath dyspnea    occasional with exertion - no oxygen   Sleep apnea    does not use cpap   TIA (transient ischemic attack) 2015   Past Surgical History:  Procedure Laterality Date   ABDOMINAL HYSTERECTOMY  1974   AV FISTULA PLACEMENT Left 12/10/2020   Procedure:  INSERTION OF ARTERIOVENOUS (AV) GORE-TEX GRAFT LEFT ARM;  Surgeon: Serafina Mitchell, MD;  Location: [place];  Service: Vascular;  Laterality: Left;   COLONOSCOPY     EYE SURGERY Bilateral    cataracts removed   IR FLUORO GUIDE CV LINE RIGHT  07/29/2020   IR FLUORO GUIDE CV LINE RIGHT  08/04/2020   IR US GUIDE VASC ACCESS RIGHT  07/29/2020   MULTIPLE TOOTH EXTRACTIONS       A IV Location/Drains/Wounds Patient Lines/Drains/Airways Status     Active Line/Drains/Airways     Name Placement date Placement time Site Days   Peripheral IV 12/10/20 Right Hand 12/10/20  0649  Hand  224   Peripheral IV 07/21/21 22 G 1.75" Anterior;Right Forearm 07/21/21  1635  Forearm  1   Fistula / Graft Left Upper arm Arteriovenous vein graft 12/10/20  0815  Upper arm  224   Hemodialysis Catheter Right Internal jugular Double lumen Permanent (Tunneled) 08/04/20  1831  Internal jugular  352   Incision (Closed) 12/10/20 Arm Left 12/10/20  0834  -- 224            Intake/Output Last 24 hours No intake or output data in the 24 hours ending 07/22/21 1057  Labs/Imaging Results for orders placed or performed during the hospital encounter of 07/21/21 (from the past 48 hour(s))  Basic metabolic panel     Status: Abnormal   Collection Time:  07/21/21  2:54 PM  Result Value Ref Range   Sodium 135 135 - 145 mmol/L   Potassium 3.8 3.5 - 5.1 mmol/L    Comment: HEMOLYSIS AT THIS LEVEL MAY AFFECT RESULT   Chloride 97 (L) 98 - 111 mmol/L   CO2 27 22 - 32 mmol/L   Glucose, Bld 168 (H) 70 - 99 mg/dL    Comment: Glucose reference range applies only to samples taken after fasting for at least 8 hours.   BUN 30 (H) 8 - 23 mg/dL   Creatinine, Ser 4.98 (H) 0.44 - 1.00 mg/dL   Calcium 9.0 8.9 - 10.3 mg/dL   GFR, Estimated 8 (L) >60 mL/min    Comment: (NOTE) Calculated using the CKD-EPI Creatinine Equation (2021)    Anion gap 11 5 - 15    Comment: Performed at [facility] [address], [place], [place]  [address]  CBC WITH DIFFERENTIAL     Status: Abnormal   Collection Time: 07/21/21  2:54 PM  Result Value Ref Range   WBC 10.5 4.0 - 10.5 K/uL   RBC 4.80 3.87 - 5.11 MIL/uL   Hemoglobin 11.3 (L) 12.0 - 15.0 g/dL   HCT 36.9 36.0 - 46.0 %   MCV 76.9 (L) 80.0 - 100.0 fL   MCH 23.5 (L) 26.0 - 34.0 pg   MCHC 30.6 30.0 - 36.0 g/dL   RDW 18.2 (H) 11.5 - 15.5 %   Platelets 234 150 - 400 K/uL   nRBC 0.0 0.0 - 0.2 %   Neutrophils Relative % 84 %   Neutro Abs 8.8 (H) 1.7 - 7.7 K/uL   Lymphocytes Relative 10 %   Lymphs Abs 1.0 0.7 - 4.0 K/uL   Monocytes Relative 6 %   Monocytes Absolute 0.6 0.1 - 1.0 K/uL   Eosinophils Relative 0 %   Eosinophils Absolute 0.0 0.0 - 0.5 K/uL   Basophils Relative 0 %   Basophils Absolute 0.0 0.0 - 0.1 K/uL   Immature Granulocytes 0 %   Abs Immature Granulocytes 0.04 0.00 - 0.07 K/uL    Comment: Performed at [facility], [place] [address], [place], [place] [address]  Resp Panel by RT-PCR (Flu A&B, Covid) Nasopharyngeal Swab     Status: None   Collection Time: 07/21/21  3:21 PM   Specimen: Nasopharyngeal Swab; Nasopharyngeal(NP) swabs in vial transport medium  Result Value Ref Range   SARS Coronavirus 2 by RT PCR NEGATIVE NEGATIVE    Comment: (NOTE) SARS-CoV-2 target nucleic acids are NOT DETECTED.  The SARS-CoV-2 RNA is generally detectable in upper respiratory specimens during the acute phase of infection. The lowest concentration of SARS-CoV-2 viral copies this assay can detect is 138 copies/mL. A negative result does not preclude SARS-Cov-2 infection and should not be used as the sole basis for treatment or other patient management decisions. A negative result may occur with  improper specimen collection/handling, submission of specimen other than nasopharyngeal swab, presence of viral mutation(s) within the areas targeted by this assay, and inadequate number of viral copies(<138 copies/mL). A negative result must be combined with clinical  observations, patient history, and epidemiological information. The expected result is Negative.  Fact Sheet for Patients:  EntrepreneurPulse.com.au  Fact Sheet for Healthcare Providers:  IncredibleEmployment.be  This test is no t yet approved or cleared by the [place] FDA and  has been authorized for detection and/or diagnosis of SARS-CoV-2 by FDA under an Emergency Use Authorization (EUA). This EUA will remain  in  effect (meaning this test can be used) for the duration of the COVID-19 declaration under Section 564(b)(1) of the Act, 21 U.S.C.section 360bbb-3(b)(1), unless the authorization is terminated  or revoked sooner.       Influenza A by PCR NEGATIVE NEGATIVE   Influenza B by PCR NEGATIVE NEGATIVE    Comment: (NOTE) The Xpert Xpress SARS-CoV-2/FLU/RSV plus assay is intended as an aid in the diagnosis of influenza from Nasopharyngeal swab specimens and should not be used as a sole basis for treatment. Nasal washings and aspirates are unacceptable for Xpert Xpress SARS-CoV-2/FLU/RSV testing.  Fact Sheet for Patients: EntrepreneurPulse.com.au  Fact Sheet for Healthcare Providers: IncredibleEmployment.be  This test is not yet approved or cleared by the [place] FDA and has been authorized for detection and/or diagnosis of SARS-CoV-2 by FDA under an Emergency Use Authorization (EUA). This EUA will remain in effect (meaning this test can be used) for the duration of the COVID-19 declaration under Section 564(b)(1) of the Act, 21 U.S.C. section 360bbb-3(b)(1), unless the authorization is terminated or revoked.  Performed at [facility], [place] [address] [place], [place] [address]   Type and screen [place]     Status: None   Collection Time: 07/21/21  4:20 PM  Result Value Ref Range   ABO/RH(D) B POS    Antibody Screen NEG    Sample Expiration       07/24/2021,2359 Performed at [facility], [place] [address], [place], [place] [address]   Hemoglobin A1c     Status: Abnormal   Collection Time: 07/21/21  6:04 PM  Result Value Ref Range   Hgb A1c MFr Bld 6.4 (H) 4.8 - 5.6 %    Comment: (NOTE) Pre diabetes:          5.7%-6.4%  Diabetes:              >6.4%  Glycemic control for   <7.0% adults with diabetes    Mean Plasma Glucose 136.98 mg/dL    Comment: Performed at [facility] [address] [place], [place] [address]  CBG monitoring, ED     Status: Abnormal   Collection Time: 07/21/21  9:53 PM  Result Value Ref Range   Glucose-Capillary 237 (H) 70 - 99 mg/dL    Comment: Glucose reference range applies only to samples taken after fasting for at least 8 hours.  VITAMIN D 25 Hydroxy (Vit-D Deficiency, Fractures)     Status: None   Collection Time: 07/22/21  3:40 AM  Result Value Ref Range   Vit D, 25-Hydroxy 34.61 30 - 100 ng/mL    Comment: (NOTE) Vitamin D deficiency has been defined by the [place] practice guideline as a level of serum 25-OH  vitamin D less than 20 ng/mL (1,2). The Endocrine Society went on to  further define vitamin D insufficiency as a level between 21 and 29  ng/mL (2).  1. IOM (Institute of Medicine). 2010. Dietary reference intakes for  calcium and D. [place]: The [employer]. 2. Holick MF, Binkley [place], Bischoff-Ferrari HA, et al. Evaluation,  treatment, and prevention of vitamin D deficiency: an Endocrine  Society clinical practice guideline, JCEM. 2011 Jul; 96(7): 1911-30.  Performed at [facility], [place] [address] [place], [place] [address]   CBC     Status: Abnormal   Collection Time: 07/22/21  3:40 AM  Result Value Ref Range   WBC 6.3 4.0 -  10.5 K/uL   RBC 3.76 (L) 3.87 - 5.11 MIL/uL   Hemoglobin 8.9 (L) 12.0 - 15.0 g/dL    Comment: Reticulocyte Hemoglobin testing may be clinically indicated, consider ordering  this additional test [id number]    HCT 28.7 (L) 36.0 - 46.0 %   MCV 76.3 (L) 80.0 - 100.0 fL   MCH 23.7 (L) 26.0 - 34.0 pg   MCHC 31.0 30.0 - 36.0 g/dL   RDW 17.7 (H) 11.5 - 15.5 %   Platelets 183 150 - 400 K/uL   nRBC 0.0 0.0 - 0.2 %    Comment: Performed at [facility], [place] [name], [place], [place] [address]  Basic metabolic panel     Status: Abnormal   Collection Time: 07/22/21  3:40 AM  Result Value Ref Range   Sodium 136 135 - 145 mmol/L   Potassium 3.3 (L) 3.5 - 5.1 mmol/L   Chloride 103 98 - 111 mmol/L   CO2 27 22 - 32 mmol/L   Glucose, Bld 128 (H) 70 - 99 mg/dL    Comment: Glucose reference range applies only to samples taken after fasting for at least 8 hours.   BUN 33 (H) 8 - 23 mg/dL   Creatinine, Ser 5.42 (H) 0.44 - 1.00 mg/dL   Calcium 8.3 (L) 8.9 - 10.3 mg/dL   GFR, Estimated 7 (L) >60 mL/min    Comment: (NOTE) Calculated using the CKD-EPI Creatinine Equation (2021)    Anion gap 6 5 - 15    Comment: Performed at [facility] [address] [place], [place] [address]  CBG monitoring, ED     Status: Abnormal   Collection Time: 07/22/21  8:48 AM  Result Value Ref Range   Glucose-Capillary 172 (H) 70 - 99 mg/dL    Comment: Glucose reference range applies only to samples taken after fasting for at least 8 hours.   DG Pelvis 1-2 Views  Result Date: 07/21/2021 CLINICAL DATA:  Right hip pain after fall. EXAM: PELVIS - 1-2 VIEW COMPARISON:  None. FINDINGS: Acute minimally impacted right subcapital femoral neck fracture. No dislocation. The pubic symphysis and sacroiliac joints are intact. The hip joint spaces are preserved. Osteopenia. Soft tissues are unremarkable. IMPRESSION: 1. Acute minimally impacted right subcapital femoral neck fracture. Electronically Signed   By: Titus Dubin M.D.   On: 07/21/2021 14:11   CT Head Wo Contrast  Result Date: 07/21/2021 CLINICAL DATA:  Head trauma, fall EXAM: CT HEAD WITHOUT CONTRAST TECHNIQUE: Contiguous axial  images were obtained from the base of the skull through the vertex without intravenous contrast. COMPARISON:  CT head examination dated November 19, 2020. FINDINGS: Brain: No evidence of acute infarction, hemorrhage, hydrocephalus, extra-axial collection or mass lesion/mass effect. Mild cerebral atrophy and chronic microvascular ischemic changes of the white matter, unchanged. Hypodensity in the right basal ganglia, which may represent lacunar infarct or ischemic changes. Vascular: Prominent atherosclerotic calcification of bilateral carotid siphons. Skull: Right high parietal scalp hematoma without evidence of calvarial fracture. Sinuses/Orbits: No acute finding. Other: None. IMPRESSION: 1.  No acute intracranial abnormality. 2. Right high parietal scalp hematoma without evidence of calvarial fracture. Electronically Signed   By: Keane Police D.O.   On: 07/21/2021 14:08   CT Cervical Spine Wo Contrast  Addendum Date: 07/21/2021   ADDENDUM REPORT: 07/21/2021 14:21 ADDENDUM: Following should be added to the impression. There is 1.7 cm low-density nodule in the left lobe of thyroid. When the patient's clinical condition permits, follow-up thyroid sonogram  should be considered. Electronically Signed   By: Elmer Picker M.D.   On: 07/21/2021 14:21   Result Date: 07/21/2021 CLINICAL DATA:  Trauma trauma fall EXAM: CT CERVICAL SPINE WITHOUT CONTRAST TECHNIQUE: Multidetector CT imaging of the cervical spine was performed without intravenous contrast. Multiplanar CT image reconstructions were also generated. COMPARISON:  None. FINDINGS: Alignment: Alignment of posterior margins of vertebral bodies is within normal limits. Skull base and vertebrae: No recent fracture is seen. Degenerative changes are noted with bony spurs and facet hypertrophy at multiple levels. Soft tissues and spinal canal: There is mild to moderate spinal stenosis at C5-C6 and C6-C7 levels. Disc levels: There is mild to moderate encroachment  of neural foramina from C3-C7 levels. Upper chest: Visualized apical portions of lung fields are unremarkable. Other: There is inhomogeneous attenuation in thyroid. There is 1.7 cm low-density nodule in the left lobe. IMPRESSION: No recent fracture is seen in the cervical spine. Osteopenia. Cervical spondylosis. There is spinal stenosis at C5-C6 and C6-C7 levels. There is encroachment of neural foramina from C3-C7 levels. Electronically Signed: By: Elmer Picker M.D. On: 07/21/2021 14:13   CT Hip Right Wo Contrast  Result Date: 07/21/2021 CLINICAL DATA:  [place].  Right hip pain. EXAM: CT OF THE RIGHT HIP WITHOUT CONTRAST TECHNIQUE: Multidetector CT imaging of the right hip was performed according to the standard protocol. Multiplanar CT image reconstructions were also generated. COMPARISON:  Radiographs, same date. FINDINGS: There is a nondisplaced and minimally impacted subcapital fracture. The visualized right hemipelvic bony structures are intact. The right SI joint appears normal. No significant intrapelvic abnormalities are identified. IMPRESSION: Nondisplaced and minimally impacted subcapital fracture of the right hip. Electronically Signed   By: Marijo Sanes M.D.   On: 07/21/2021 18:14   DG FEMUR, MIN 2 VIEWS RIGHT  Result Date: 07/21/2021 CLINICAL DATA:  Fall, right hip pain EXAM: RIGHT FEMUR 2 VIEWS COMPARISON:  None. FINDINGS: Cortical irregularity of the femoral head neck junction concerning for nondisplaced impaction fracture. IMPRESSION: Cortical irregularity of the femoral head and neck junction concerning for nondisplaced impaction fracture. Further evaluation with CT or MRI examination would be helpful. Electronically Signed   By: Keane Police D.O.   On: 07/21/2021 14:15    Pending Labs Unresulted Labs (From admission, onward)    None       Vitals/Pain Today's Vitals   07/22/21 0900 07/22/21 0904 07/22/21 0930 07/22/21 1000  BP: (!) 105/52  (!) 106/52 (!) 105/47  Pulse:  82 82 80 79  Resp:    18  Temp:      TempSrc:      SpO2: 99%  96% 97%  PainSc:        Isolation Precautions No active isolations  Medications Medications  amLODipine (NORVASC) tablet 10 mg (has no administration in time range)  atorvastatin (LIPITOR) tablet 20 mg (20 mg Oral Given 07/22/21 0905)  cloNIDine (CATAPRES) tablet 0.1 mg (0.1 mg Oral Given 07/22/21 0904)  hydrALAZINE (APRESOLINE) tablet 25 mg (25 mg Oral Given 07/22/21 0850)  metoprolol tartrate (LOPRESSOR) tablet 12.5 mg (12.5 mg Oral Given 07/22/21 0904)  loratadine (CLARITIN) tablet 10 mg (10 mg Oral Given 07/22/21 0905)  heparin injection 5,000 Units (5,000 Units Subcutaneous Given 07/22/21 0907)  HYDROmorphone (DILAUDID) injection 0.5 mg (has no administration in time range)  senna-docusate (Senokot-S) tablet 1 tablet (has no administration in time range)  bisacodyl (DULCOLAX) EC tablet 5 mg (has no administration in time range)  insulin aspart (novoLOG) injection 0-6 Units (1  Units Subcutaneous Given 07/22/21 0902)  oxyCODONE-acetaminophen (PERCOCET/ROXICET) 5-325 MG per tablet 1 tablet (has no administration in time range)    And  oxyCODONE (Oxy IR/ROXICODONE) immediate release tablet 5 mg (has no administration in time range)  polyethylene glycol (MIRALAX / GLYCOLAX) packet 17 g (has no administration in time range)    Mobility walks Moderate fall risk   Focused Assessments Cardiac Assessment Handoff:    Lab Results  Component Value Date   TROPONINI <0.03 05/26/2015   Lab Results  Component Value Date   DDIMER 1.76 (H) 08/07/2020   Does the Patient currently have chest pain? No    R Recommendations: See Admitting Provider Note  Report given to:   Additional Notes:

## 2021-07-22 NOTE — Consult Note (Signed)
[facility] Renal Consultation Note    Indication for Consultation:  Management of ESRD/hemodialysis; anemia, hypertension/volume and secondary hyperparathyroidism  HPI: [name] [name] is a 85 y.o. female with ESRD on HD, MWF, DM, HTN, CHF, PAF on Eliquis. She is admitted with right hip fracture after a fall at home. She stood up to turn and fell. No LOC, no dizziness or lightheadedness. Orthopedics consulted with plans for surgical repair Friday.  Labs this am: Na 136, K 3.3, CO2 27, WBC 6.3, Hgb 8.9 .   Seen and examined in the ED, she is alert, without any complaints except hip pain. Daughter at bedside.  Dialysis MWF at [facility]. Dialysis via AVG. Usually tolerates well. Last dialysis was Monday 12/12. She missed dialysis yesterday d/t hospital admission.   Past Medical History:  Diagnosis Date   Arthritis    RA   CHF (congestive heart failure) (HCC)    CKD (chronic kidney disease) stage 3, GFR 30-59 ml/min (HCC)    M-W-F dialysis   Dementia (HCC)    Diabetes mellitus without complication (HCC)    insulin dependent   Hyperlipidemia    Hypertension    Seasonal allergies    Shortness of breath dyspnea    occasional with exertion - no oxygen   Sleep apnea    does not use cpap   TIA (transient ischemic attack) 2015   Past Surgical History:  Procedure Laterality Date   ABDOMINAL HYSTERECTOMY  1974   AV FISTULA PLACEMENT Left 12/10/2020   Procedure: INSERTION OF ARTERIOVENOUS (AV) GORE-TEX GRAFT LEFT ARM;  Surgeon: Serafina Mitchell, MD;  Location: [facility] OR;  Service: Vascular;  Laterality: Left;   COLONOSCOPY     EYE SURGERY Bilateral    cataracts removed   IR FLUORO GUIDE CV LINE RIGHT  07/29/2020   IR FLUORO GUIDE CV LINE RIGHT  08/04/2020   IR US GUIDE VASC ACCESS RIGHT  07/29/2020   MULTIPLE TOOTH EXTRACTIONS     Family History  Problem Relation Age of Onset   Diabetes type II Mother    Hypertension Mother    Diabetes type II  Father    Hypertension Other    Hyperlipidemia Other    Stroke Other    Social History:  reports that she has never smoked. She has never used smokeless tobacco. She reports that she does not drink alcohol and does not use drugs. Allergies  Allergen Reactions   Penicillins Swelling and Rash   Prior to Admission medications   Medication Sig Start Date End Date Taking? Authorizing Provider  amLODipine (NORVASC) 10 MG tablet TAKE 1 TABLET(10 MG) BY MOUTH DAILY Patient taking differently: Take 10 mg by mouth daily. 06/10/21  Yes [name], MD  apixaban (ELIQUIS) 2.5 MG TABS tablet TAKE 1 TABLET(2.5 MG) BY MOUTH TWICE DAILY Patient taking differently: Take 2.5 mg by mouth 2 (two) times daily. 06/14/21  Yes [name], MD  ascorbic acid (VITAMIN C) 500 MG tablet Take 1 tablet (500 mg total) by mouth daily. 08/15/20  Yes Samuella Cota, MD  atorvastatin (LIPITOR) 20 MG tablet TAKE 1 TABLET(20 MG) BY MOUTH DAILY AT 6 PM FOR CHOLESTEROL Patient taking differently: Take 20 mg by mouth daily. 02/16/21  Yes [name], MD  blood glucose meter kit and supplies KIT Dispense based on patient and insurance preference. Use up to four times daily as directed. (FOR ICD-9 250.00, 250.01). Patient taking differently: Inject 1 each into the skin See admin instructions. Dispense  based on patient and insurance preference. Use up to four times daily as directed. (FOR ICD-9 250.00, 250.01). 04/01/17  Yes Mackuen, Courteney Lyn, MD  donepezil (ARICEPT) 5 MG tablet TAKE 1 TABLET(5 MG) BY MOUTH AT BEDTIME Patient taking differently: Take 5 mg by mouth at bedtime. 02/09/21  Yes Minette Brine, FNP  glucose blood test strip Use as directed to check blood sugars 3 times per day dx: e11.65 Patient taking differently: 1 each by Other route See admin instructions. Use as directed to check blood sugars 3 times per day dx: e11.65 06/09/20  Yes [name], MD  lanthanum (FOSRENOL) 1000 MG chewable tablet Chew 1,000  mg by mouth 2 (two) times daily with a meal.   Yes [provider]  LEVEMIR FLEXTOUCH 100 UNIT/ML FlexTouch Pen INJECT 30 UNITS WITH BREAKFAST AND 15 UNITS BEFORE BEDTIME Patient taking differently: Inject 8 Units into the skin at bedtime. 03/03/21  Yes Ghumman, Ramandeep, NP  loratadine (CLARITIN) 10 MG tablet Take 10 mg by mouth daily.   Yes [provider]  metoprolol tartrate (LOPRESSOR) 25 MG tablet TAKE 1/2 TABLET(12.5 MG) BY MOUTH TWICE DAILY Patient taking differently: Take 12.5 mg by mouth 2 (two) times daily. 06/10/21  Yes [name], MD  zinc sulfate 220 (50 Zn) MG capsule Take 1 capsule (220 mg total) by mouth daily. 08/15/20  Yes Samuella Cota, MD  cloNIDine (CATAPRES) 0.1 MG tablet TAKE 1 TABLET(0.1 MG) BY MOUTH THREE TIMES DAILY Patient not taking: Reported on 07/21/2021 11/18/20   [name], MD  hydrALAZINE (APRESOLINE) 25 MG tablet TAKE 1 TABLET BY MOUTH THREE TIMES DAILY WITH FOOD Patient not taking: Reported on 07/21/2021 11/18/20   [name], MD  oxyCODONE-acetaminophen (PERCOCET) 10-325 MG tablet Take 1 tablet by mouth every 6 (six) hours as needed for pain. Patient not taking: Reported on 07/21/2021 12/10/20 12/10/21  Vaughan Basta, [address], PA-C  oxyCODONE-acetaminophen (PERCOCET) 10-325 MG tablet Take 1 tablet by mouth every 6 (six) hours as needed for pain. Patient not taking: Reported on 07/21/2021 12/10/20 12/10/21  Barbie Banner, PA-C   Current Facility-Administered Medications  Medication Dose Route Frequency Provider Last Rate Last Admin   amLODipine (NORVASC) tablet 10 mg  10 mg Oral Daily Wynetta Fines T, MD       atorvastatin (LIPITOR) tablet 20 mg  20 mg Oral Daily Wynetta Fines T, MD   20 mg at 07/22/21 0905   bisacodyl (DULCOLAX) EC tablet 5 mg  5 mg Oral Daily PRN Wynetta Fines T, MD       cloNIDine (CATAPRES) tablet 0.1 mg  0.1 mg Oral TID Wynetta Fines T, MD   0.1 mg at 07/22/21 0904   heparin injection 5,000 Units  5,000 Units Subcutaneous  Q12H Wynetta Fines T, MD   5,000 Units at 07/22/21 7622   hydrALAZINE (APRESOLINE) tablet 25 mg  25 mg Oral TID WC Wynetta Fines T, MD   25 mg at 07/22/21 0850   HYDROmorphone (DILAUDID) injection 0.5 mg  0.5 mg Intravenous Q2H PRN Wynetta Fines T, MD       insulin aspart (novoLOG) injection 0-6 Units  0-6 Units Subcutaneous TID WC Lequita Halt, MD   1 Units at 07/22/21 0902   loratadine (CLARITIN) tablet 10 mg  10 mg Oral Daily Wynetta Fines T, MD   10 mg at 07/22/21 0905   metoprolol tartrate (LOPRESSOR) tablet 12.5 mg  12.5 mg Oral BID Wynetta Fines T, MD   12.5 mg at 07/22/21 [phone]  oxyCODONE-acetaminophen (PERCOCET/ROXICET) 5-325 MG per tablet 1 tablet  1 tablet Oral Q6H PRN Wynetta Fines T, MD       And   oxyCODONE (Oxy IR/ROXICODONE) immediate release tablet 5 mg  5 mg Oral Q6H PRN Lequita Halt, MD       [START ON 07/23/2021] polyethylene glycol (MIRALAX / GLYCOLAX) packet 17 g  17 g Oral BID Charlynne Cousins, MD       senna-docusate (Senokot-S) tablet 1 tablet  1 tablet Oral QHS PRN Lequita Halt, MD         ROS: As per HPI otherwise negative.  Physical Exam: Vitals:   07/22/21 0930 07/22/21 1000 07/22/21 1100 07/22/21 1203  BP: (!) 106/52 (!) 105/47 (!) 101/51 (!) 92/53  Pulse: 80 79 71 73  Resp:  '18 18 17  ' Temp:   98.8 F (37.1 C) 98.5 F (36.9 C)  TempSrc:   Oral Oral  SpO2: 96% 97% 99% 100%     General: Appears comfortable, in no distress  Head: NCAT sclera not icteric MMM Neck: Supple. No JVD appreciated  Lungs: Clear bilaterally without wheezes, rales, or rhonchi. Normal WOB  Heart: RRR, no murmur, rub, or gallop  Abdomen: soft non-tender, bowel sounds normal, no masses  Lower extremities:without edema or ischemic changes, no open wounds  Neuro: A & O X 3. Moves all extremities spontaneously. Psych:  Responds to questions appropriately with a normal affect. Dialysis Access: LUE AVG +bruit   Labs: Basic Metabolic Panel: Recent Labs  Lab 07/21/21 1454 07/22/21 0340   NA 135 136  K 3.8 3.3*  CL 97* 103  CO2 27 27  GLUCOSE 168* 128*  BUN 30* 33*  CREATININE 4.98* 5.42*  CALCIUM 9.0 8.3*   Liver Function Tests: No results for input(s): AST, ALT, ALKPHOS, BILITOT, PROT, ALBUMIN in the last 168 hours. No results for input(s): LIPASE, AMYLASE in the last 168 hours. No results for input(s): AMMONIA in the last 168 hours. CBC: Recent Labs  Lab 07/21/21 1454 07/22/21 0340  WBC 10.5 6.3  NEUTROABS 8.8*  --   HGB 11.3* 8.9*  HCT 36.9 28.7*  MCV 76.9* 76.3*  PLT 234 183   Cardiac Enzymes: No results for input(s): CKTOTAL, CKMB, CKMBINDEX, TROPONINI in the last 168 hours. CBG: Recent Labs  Lab 07/21/21 2153 07/22/21 0848 07/22/21 1213  GLUCAP 237* 172* 109*   Iron Studies: No results for input(s): IRON, TIBC, TRANSFERRIN, FERRITIN in the last 72 hours. Studies/Results: DG Pelvis 1-2 Views  Result Date: 07/21/2021 CLINICAL DATA:  Right hip pain after fall. EXAM: PELVIS - 1-2 VIEW COMPARISON:  None. FINDINGS: Acute minimally impacted right subcapital femoral neck fracture. No dislocation. The pubic symphysis and sacroiliac joints are intact. The hip joint spaces are preserved. Osteopenia. Soft tissues are unremarkable. IMPRESSION: 1. Acute minimally impacted right subcapital femoral neck fracture. Electronically Signed   By: Titus Dubin M.D.   On: 07/21/2021 14:11   CT Head Wo Contrast  Result Date: 07/21/2021 CLINICAL DATA:  Head trauma, fall EXAM: CT HEAD WITHOUT CONTRAST TECHNIQUE: Contiguous axial images were obtained from the base of the skull through the vertex without intravenous contrast. COMPARISON:  CT head examination dated November 19, 2020. FINDINGS: Brain: No evidence of acute infarction, hemorrhage, hydrocephalus, extra-axial collection or mass lesion/mass effect. Mild cerebral atrophy and chronic microvascular ischemic changes of the white matter, unchanged. Hypodensity in the right basal ganglia, which may represent lacunar  infarct or ischemic changes. Vascular: Prominent atherosclerotic calcification of  bilateral carotid siphons. Skull: Right high parietal scalp hematoma without evidence of calvarial fracture. Sinuses/Orbits: No acute finding. Other: None. IMPRESSION: 1.  No acute intracranial abnormality. 2. Right high parietal scalp hematoma without evidence of calvarial fracture. Electronically Signed   By: Keane Police D.O.   On: 07/21/2021 14:08   CT Cervical Spine Wo Contrast  Addendum Date: 07/21/2021   ADDENDUM REPORT: 07/21/2021 14:21 ADDENDUM: Following should be added to the impression. There is 1.7 cm low-density nodule in the left lobe of thyroid. When the patient's clinical condition permits, follow-up thyroid sonogram should be considered. Electronically Signed   By: Elmer Picker M.D.   On: 07/21/2021 14:21   Result Date: 07/21/2021 CLINICAL DATA:  Trauma trauma fall EXAM: CT CERVICAL SPINE WITHOUT CONTRAST TECHNIQUE: Multidetector CT imaging of the cervical spine was performed without intravenous contrast. Multiplanar CT image reconstructions were also generated. COMPARISON:  None. FINDINGS: Alignment: Alignment of posterior margins of vertebral bodies is within normal limits. Skull base and vertebrae: No recent fracture is seen. Degenerative changes are noted with bony spurs and facet hypertrophy at multiple levels. Soft tissues and spinal canal: There is mild to moderate spinal stenosis at C5-C6 and C6-C7 levels. Disc levels: There is mild to moderate encroachment of neural foramina from C3-C7 levels. Upper chest: Visualized apical portions of lung fields are unremarkable. Other: There is inhomogeneous attenuation in thyroid. There is 1.7 cm low-density nodule in the left lobe. IMPRESSION: No recent fracture is seen in the cervical spine. Osteopenia. Cervical spondylosis. There is spinal stenosis at C5-C6 and C6-C7 levels. There is encroachment of neural foramina from C3-C7 levels. Electronically  Signed: By: Elmer Picker M.D. On: 07/21/2021 14:13   CT Hip Right Wo Contrast  Result Date: 07/21/2021 CLINICAL DATA:  [place].  Right hip pain. EXAM: CT OF THE RIGHT HIP WITHOUT CONTRAST TECHNIQUE: Multidetector CT imaging of the right hip was performed according to the standard protocol. Multiplanar CT image reconstructions were also generated. COMPARISON:  Radiographs, same date. FINDINGS: There is a nondisplaced and minimally impacted subcapital fracture. The visualized right hemipelvic bony structures are intact. The right SI joint appears normal. No significant intrapelvic abnormalities are identified. IMPRESSION: Nondisplaced and minimally impacted subcapital fracture of the right hip. Electronically Signed   By: Marijo Sanes M.D.   On: 07/21/2021 18:14   DG FEMUR, MIN 2 VIEWS RIGHT  Result Date: 07/21/2021 CLINICAL DATA:  Fall, right hip pain EXAM: RIGHT FEMUR 2 VIEWS COMPARISON:  None. FINDINGS: Cortical irregularity of the femoral head neck junction concerning for nondisplaced impaction fracture. IMPRESSION: Cortical irregularity of the femoral head and neck junction concerning for nondisplaced impaction fracture. Further evaluation with CT or MRI examination would be helpful. Electronically Signed   By: Keane Police D.O.   On: 07/21/2021 14:15    Dialysis Orders:  Unit: [place] MWF  Time: 3h 45 min  EDW: 55 kg  Flows: 400/1.5A Bath: 2K/2.5Ca  Access: AVG Heparin: none ESA: Mircera 75 q 4 wks (last 12/12)  VDRA: none  Assessment/Plan:  Right hip fx -- ortho consulted. For surgery Friday.   ESRD -  HD MWF. No urgent dialysis indications today. Next HD 12/16 on schedule.   Hypertension/volume  - Blood pressures soft here. No volume excess. UF toe EDW as tolerated.   Anemia  - Hgb 8.9. Recent ESA dose as outpatient. Follow trends.   Metabolic bone disease -  Ca ok. No VDRA. Continue home binders   Nutrition - Renal diet/fluid restriction when  taking PO.   Lynnda Child  PA-C [facility] 07/22/2021, 12:27 PM

## 2021-07-23 ENCOUNTER — Encounter (HOSPITAL_COMMUNITY)
Admission: EM | Disposition: A | Discharge status: Home Health | Payer: Self-pay | Source: Home / Self Care | Attending: Internal Medicine

## 2021-07-23 ENCOUNTER — Inpatient Hospital Stay (HOSPITAL_COMMUNITY): Payer: Medicare HMO

## 2021-07-23 ENCOUNTER — Other Ambulatory Visit: Payer: Self-pay

## 2021-07-23 ENCOUNTER — Inpatient Hospital Stay (HOSPITAL_COMMUNITY): Payer: Medicare HMO | Admitting: Certified Registered Nurse Anesthetist

## 2021-07-23 ENCOUNTER — Encounter (HOSPITAL_COMMUNITY): Payer: Self-pay | Admitting: Internal Medicine

## 2021-07-23 DIAGNOSIS — I132 Hypertensive heart and chronic kidney disease with heart failure and with stage 5 chronic kidney disease, or end stage renal disease: Secondary | ICD-10-CM | POA: Diagnosis not present

## 2021-07-23 DIAGNOSIS — Z794 Long term (current) use of insulin: Secondary | ICD-10-CM | POA: Diagnosis not present

## 2021-07-23 DIAGNOSIS — D631 Anemia in chronic kidney disease: Secondary | ICD-10-CM

## 2021-07-23 DIAGNOSIS — S72001A Fracture of unspecified part of neck of right femur, initial encounter for closed fracture: Secondary | ICD-10-CM | POA: Diagnosis not present

## 2021-07-23 DIAGNOSIS — N186 End stage renal disease: Secondary | ICD-10-CM | POA: Diagnosis not present

## 2021-07-23 DIAGNOSIS — E1122 Type 2 diabetes mellitus with diabetic chronic kidney disease: Secondary | ICD-10-CM | POA: Diagnosis not present

## 2021-07-23 DIAGNOSIS — I5032 Chronic diastolic (congestive) heart failure: Secondary | ICD-10-CM | POA: Diagnosis not present

## 2021-07-23 DIAGNOSIS — W19XXXA Unspecified fall, initial encounter: Secondary | ICD-10-CM

## 2021-07-23 HISTORY — PX: TOTAL HIP ARTHROPLASTY: SHX124

## 2021-07-23 LAB — GLUCOSE, CAPILLARY
Glucose-Capillary: 135 mg/dL — ABNORMAL HIGH (ref 70–99)
Glucose-Capillary: 85 mg/dL (ref 70–99)
Glucose-Capillary: 91 mg/dL (ref 70–99)
Glucose-Capillary: 114 mg/dL — ABNORMAL HIGH (ref 70–99)
Glucose-Capillary: 150 mg/dL — ABNORMAL HIGH (ref 70–99)

## 2021-07-23 LAB — RENAL FUNCTION PANEL
Albumin: 2.5 g/dL — ABNORMAL LOW (ref 3.5–5.0)
Anion gap: 9 (ref 5–15)
BUN: 44 mg/dL — ABNORMAL HIGH (ref 8–23)
CO2: 25 mmol/L (ref 22–32)
Calcium: 8.3 mg/dL — ABNORMAL LOW (ref 8.9–10.3)
Chloride: 101 mmol/L (ref 98–111)
Creatinine, Ser: 6.64 mg/dL — ABNORMAL HIGH (ref 0.44–1.00)
GFR, Estimated: 6 mL/min — ABNORMAL LOW (ref >60)
Glucose, Bld: 139 mg/dL — ABNORMAL HIGH (ref 70–99)
Phosphorus: 2.9 mg/dL (ref 2.5–4.6)
Potassium: 3.8 mmol/L (ref 3.5–5.1)
Sodium: 135 mmol/L (ref 135–145)

## 2021-07-23 LAB — CBC
HCT: 29 % — ABNORMAL LOW (ref 36.0–46.0)
Hemoglobin: 9 g/dL — ABNORMAL LOW (ref 12.0–15.0)
MCH: 23.3 pg — ABNORMAL LOW (ref 26.0–34.0)
MCHC: 31 g/dL (ref 30.0–36.0)
MCV: 75.1 fL — ABNORMAL LOW (ref 80.0–100.0)
Platelets: 214 10*3/uL (ref 150–400)
RBC: 3.86 MIL/uL — ABNORMAL LOW (ref 3.87–5.11)
RDW: 17.9 % — ABNORMAL HIGH (ref 11.5–15.5)
WBC: 7.5 10*3/uL (ref 4.0–10.5)
nRBC: 0 % (ref 0.0–0.2)

## 2021-07-23 LAB — SURGICAL PCR SCREEN
MRSA, PCR: NEGATIVE
Staphylococcus aureus: POSITIVE — AB

## 2021-07-23 SURGERY — ARTHROPLASTY, HIP, TOTAL, ANTERIOR APPROACH
Anesthesia: General | Site: Hip | Laterality: Right

## 2021-07-23 MED ORDER — ACETAMINOPHEN 10 MG/ML IV SOLN
INTRAVENOUS | Status: DC | PRN
  Administered 2021-07-23: 1000 mg via INTRAVENOUS

## 2021-07-23 MED ORDER — FENTANYL CITRATE (PF) 100 MCG/2ML IJ SOLN: 25.0000 ug | INTRAMUSCULAR | Status: DC | PRN

## 2021-07-23 MED ORDER — CHLORHEXIDINE GLUCONATE CLOTH 2 % EX PADS: 6.0000 | MEDICATED_PAD | Freq: Every day | CUTANEOUS | Status: DC

## 2021-07-23 MED ORDER — FENTANYL CITRATE (PF) 250 MCG/5ML IJ SOLN
INTRAMUSCULAR | Status: AC
  Filled 2021-07-23: qty 5

## 2021-07-23 MED ORDER — BUPIVACAINE-EPINEPHRINE (PF) 0.5% -1:200000 IJ SOLN
INTRAMUSCULAR | Status: DC | PRN
  Administered 2021-07-23: 30 mL

## 2021-07-23 MED ORDER — METOCLOPRAMIDE HCL 5 MG PO TABS: 5.0000 mg | ORAL_TABLET | Freq: Three times a day (TID) | ORAL | Status: DC | PRN

## 2021-07-23 MED ORDER — PROPOFOL 10 MG/ML IV BOLUS
INTRAVENOUS | Status: DC | PRN
  Administered 2021-07-23: 100 mg via INTRAVENOUS

## 2021-07-23 MED ORDER — PHENYLEPHRINE 40 MCG/ML (10ML) SYRINGE FOR IV PUSH (FOR BLOOD PRESSURE SUPPORT)
PREFILLED_SYRINGE | INTRAVENOUS | Status: DC | PRN
  Administered 2021-07-23 (×2): 80 ug via INTRAVENOUS

## 2021-07-23 MED ORDER — ORAL CARE MOUTH RINSE: 15.0000 mL | Freq: Once | OROMUCOSAL | Status: AC

## 2021-07-23 MED ORDER — ROCURONIUM BROMIDE 10 MG/ML (PF) SYRINGE
PREFILLED_SYRINGE | INTRAVENOUS | Status: DC | PRN
  Administered 2021-07-23: 50 mg via INTRAVENOUS

## 2021-07-23 MED ORDER — ACETAMINOPHEN 10 MG/ML IV SOLN
INTRAVENOUS | Status: AC
  Filled 2021-07-23: qty 100

## 2021-07-23 MED ORDER — MORPHINE SULFATE (PF) 2 MG/ML IV SOLN
0.5000 mg | INTRAVENOUS | Status: DC | PRN
  Administered 2021-07-30: 05:00:00 1 mg via INTRAVENOUS
  Filled 2021-07-23: qty 1

## 2021-07-23 MED ORDER — ROCURONIUM BROMIDE 10 MG/ML (PF) SYRINGE
PREFILLED_SYRINGE | INTRAVENOUS | Status: AC
  Filled 2021-07-23: qty 10

## 2021-07-23 MED ORDER — KETOROLAC TROMETHAMINE 30 MG/ML IJ SOLN
INTRAMUSCULAR | Status: AC
  Filled 2021-07-23: qty 1

## 2021-07-23 MED ORDER — PHENYLEPHRINE HCL-NACL 20-0.9 MG/250ML-% IV SOLN
INTRAVENOUS | Status: DC | PRN
  Administered 2021-07-23: 30 ug/min via INTRAVENOUS

## 2021-07-23 MED ORDER — ONDANSETRON HCL 4 MG/2ML IJ SOLN
4.0000 mg | Freq: Four times a day (QID) | INTRAMUSCULAR | Status: DC | PRN
  Filled 2021-07-23: qty 2

## 2021-07-23 MED ORDER — SENNA 8.6 MG PO TABS
1.0000 | ORAL_TABLET | Freq: Two times a day (BID) | ORAL | Status: DC
  Administered 2021-07-23 – 2021-07-31 (×15): 8.6 mg via ORAL
  Filled 2021-07-23 (×16): qty 1

## 2021-07-23 MED ORDER — TRANEXAMIC ACID-NACL 1000-0.7 MG/100ML-% IV SOLN
1000.0000 mg | Freq: Once | INTRAVENOUS | Status: AC
  Administered 2021-07-23: 1000 mg via INTRAVENOUS
  Filled 2021-07-23: qty 100

## 2021-07-23 MED ORDER — HYDROCODONE-ACETAMINOPHEN 7.5-325 MG PO TABS
1.0000 | ORAL_TABLET | ORAL | Status: DC | PRN
  Administered 2021-07-28 – 2021-07-30 (×2): 2 via ORAL
  Administered 2021-07-31: 08:00:00 1 via ORAL
  Filled 2021-07-23: qty 1
  Filled 2021-07-23 (×2): qty 2

## 2021-07-23 MED ORDER — APIXABAN 2.5 MG PO TABS
2.5000 mg | ORAL_TABLET | Freq: Two times a day (BID) | ORAL | Status: DC
  Administered 2021-07-24: 2.5 mg via ORAL
  Filled 2021-07-23 (×2): qty 1

## 2021-07-23 MED ORDER — NEPRO/CARBSTEADY PO LIQD: 237.0000 mL | Freq: Two times a day (BID) | ORAL | Status: DC

## 2021-07-23 MED ORDER — CHLORHEXIDINE GLUCONATE 0.12 % MT SOLN
15.0000 mL | Freq: Once | OROMUCOSAL | Status: AC
  Administered 2021-07-23: 15 mL via OROMUCOSAL
  Filled 2021-07-23: qty 15

## 2021-07-23 MED ORDER — KETOROLAC TROMETHAMINE 30 MG/ML IJ SOLN
INTRAMUSCULAR | Status: DC | PRN
  Administered 2021-07-23: 30 mg

## 2021-07-23 MED ORDER — LIDOCAINE 2% (20 MG/ML) 5 ML SYRINGE
INTRAMUSCULAR | Status: AC
  Filled 2021-07-23: qty 5

## 2021-07-23 MED ORDER — BUPIVACAINE-EPINEPHRINE 0.5% -1:200000 IJ SOLN
INTRAMUSCULAR | Status: AC
  Filled 2021-07-23: qty 1

## 2021-07-23 MED ORDER — VANCOMYCIN HCL IN DEXTROSE 1-5 GM/200ML-% IV SOLN: 1000.0000 mg | Freq: Two times a day (BID) | INTRAVENOUS | Status: DC

## 2021-07-23 MED ORDER — DOCUSATE SODIUM 100 MG PO CAPS
100.0000 mg | ORAL_CAPSULE | Freq: Two times a day (BID) | ORAL | Status: DC
  Administered 2021-07-23 – 2021-07-31 (×15): 100 mg via ORAL
  Filled 2021-07-23 (×16): qty 1

## 2021-07-23 MED ORDER — IRRISEPT - 450ML BOTTLE WITH 0.05% CHG IN STERILE WATER, USP 99.95% OPTIME
TOPICAL | Status: DC | PRN
  Administered 2021-07-23: 450 mL

## 2021-07-23 MED ORDER — SODIUM CHLORIDE (PF) 0.9 % IJ SOLN
INTRAMUSCULAR | Status: DC | PRN
  Administered 2021-07-23: 30 mL

## 2021-07-23 MED ORDER — OXYCODONE HCL 5 MG PO TABS: 5.0000 mg | ORAL_TABLET | Freq: Once | ORAL | Status: DC | PRN

## 2021-07-23 MED ORDER — METOCLOPRAMIDE HCL 5 MG/ML IJ SOLN: 5.0000 mg | Freq: Three times a day (TID) | INTRAMUSCULAR | Status: DC | PRN

## 2021-07-23 MED ORDER — OXYCODONE HCL 5 MG/5ML PO SOLN: 5.0000 mg | Freq: Once | ORAL | Status: DC | PRN

## 2021-07-23 MED ORDER — SUGAMMADEX SODIUM 200 MG/2ML IV SOLN
INTRAVENOUS | Status: DC | PRN
  Administered 2021-07-23: 150 mg via INTRAVENOUS

## 2021-07-23 MED ORDER — ONDANSETRON HCL 4 MG PO TABS: 4.0000 mg | ORAL_TABLET | Freq: Four times a day (QID) | ORAL | Status: DC | PRN

## 2021-07-23 MED ORDER — ACETAMINOPHEN 325 MG PO TABS
325.0000 mg | ORAL_TABLET | Freq: Four times a day (QID) | ORAL | Status: DC | PRN
  Administered 2021-07-25 – 2021-07-30 (×2): 650 mg via ORAL
  Filled 2021-07-23 (×2): qty 2

## 2021-07-23 MED ORDER — SODIUM CHLORIDE 0.9 % IV SOLN
INTRAVENOUS | Status: AC | PRN
  Administered 2021-07-23: 500 mL

## 2021-07-23 MED ORDER — ONDANSETRON HCL 4 MG/2ML IJ SOLN
INTRAMUSCULAR | Status: DC | PRN
  Administered 2021-07-23: 4 mg via INTRAVENOUS

## 2021-07-23 MED ORDER — SODIUM CHLORIDE 0.9 % IV SOLN: INTRAVENOUS | Status: DC

## 2021-07-23 MED ORDER — MENTHOL 3 MG MT LOZG: 1.0000 | LOZENGE | OROMUCOSAL | Status: DC | PRN

## 2021-07-23 MED ORDER — ONDANSETRON HCL 4 MG/2ML IJ SOLN
INTRAMUSCULAR | Status: AC
  Filled 2021-07-23: qty 2

## 2021-07-23 MED ORDER — RENA-VITE PO TABS
1.0000 | ORAL_TABLET | Freq: Every day | ORAL | Status: DC
Start: 2021-07-23 — End: 2021-07-23

## 2021-07-23 MED ORDER — 0.9 % SODIUM CHLORIDE (POUR BTL) OPTIME
TOPICAL | Status: DC | PRN
  Administered 2021-07-23: 500 mL

## 2021-07-23 MED ORDER — HYDROCODONE-ACETAMINOPHEN 5-325 MG PO TABS
1.0000 | ORAL_TABLET | ORAL | Status: DC | PRN
  Administered 2021-07-30: 04:00:00 2 via ORAL
  Filled 2021-07-23: qty 2

## 2021-07-23 MED ORDER — FENTANYL CITRATE (PF) 250 MCG/5ML IJ SOLN
INTRAMUSCULAR | Status: DC | PRN
  Administered 2021-07-23 (×3): 50 ug via INTRAVENOUS

## 2021-07-23 MED ORDER — ONDANSETRON HCL 4 MG/2ML IJ SOLN: 4.0000 mg | Freq: Four times a day (QID) | INTRAMUSCULAR | Status: DC | PRN

## 2021-07-23 MED ORDER — LIDOCAINE 2% (20 MG/ML) 5 ML SYRINGE
INTRAMUSCULAR | Status: DC | PRN
  Administered 2021-07-23: 80 mg via INTRAVENOUS

## 2021-07-23 MED ORDER — PHENOL 1.4 % MT LIQD: 1.0000 | OROMUCOSAL | Status: DC | PRN

## 2021-07-23 SURGICAL SUPPLY — 66 items
BAG COUNTER SPONGE SURGICOUNT (BAG) ×2 IMPLANT
BAG SURGICOUNT SPONGE COUNTING (BAG) ×1
BLADE CLIPPER SURG (BLADE) IMPLANT
CHLORAPREP W/TINT 26 (MISCELLANEOUS) ×3 IMPLANT
COVER SURGICAL LIGHT HANDLE (MISCELLANEOUS) ×3 IMPLANT
DERMABOND ADHESIVE PROPEN (GAUZE/BANDAGES/DRESSINGS) ×2
DERMABOND ADVANCED (GAUZE/BANDAGES/DRESSINGS) ×4
DERMABOND ADVANCED .7 DNX12 (GAUZE/BANDAGES/DRESSINGS) ×2 IMPLANT
DERMABOND ADVANCED .7 DNX6 (GAUZE/BANDAGES/DRESSINGS) IMPLANT
DRAPE C-ARM 42X72 X-RAY (DRAPES) ×3 IMPLANT
DRAPE STERI IOBAN 125X83 (DRAPES) ×3 IMPLANT
DRAPE U-SHAPE 47X51 STRL (DRAPES) ×9 IMPLANT
DRSG AQUACEL AG ADV 3.5X 6 (GAUZE/BANDAGES/DRESSINGS) ×2 IMPLANT
DRSG AQUACEL AG ADV 3.5X10 (GAUZE/BANDAGES/DRESSINGS) ×3 IMPLANT
ELECT BLADE 4.0 EZ CLEAN MEGAD (MISCELLANEOUS) ×3 IMPLANT
ELECT PENCIL ROCKER SW 15FT (MISCELLANEOUS) ×3 IMPLANT
ELECT REM PT RETURN 9FT ADLT (ELECTROSURGICAL) ×3 IMPLANT
ELECTRODE BLDE 4.0 EZ CLN MEGD (MISCELLANEOUS) ×1 IMPLANT
ELECTRODE REM PT RTRN 9FT ADLT (ELECTROSURGICAL) ×1 IMPLANT
GLOVE SURG ENC MOIS LTX SZ8.5 (GLOVE) ×6 IMPLANT
GLOVE SURG ENC TEXT LTX SZ7 (GLOVE) ×3 IMPLANT
GLOVE SURG UNDER POLY LF SZ7.5 (GLOVE) ×3 IMPLANT
GLOVE SURG UNDER POLY LF SZ8.5 (GLOVE) ×3 IMPLANT
GOWN STRL REUS W/ TWL LRG LVL3 (GOWN DISPOSABLE) ×2 IMPLANT
GOWN STRL REUS W/ TWL XL LVL3 (GOWN DISPOSABLE) ×1 IMPLANT
GOWN STRL REUS W/TWL 2XL LVL3 (GOWN DISPOSABLE) ×3 IMPLANT
GOWN STRL REUS W/TWL LRG LVL3 (GOWN DISPOSABLE) ×6
GOWN STRL REUS W/TWL XL LVL3 (GOWN DISPOSABLE) ×3
HANDPIECE INTERPULSE COAX TIP (DISPOSABLE) ×3
HEAD MOD COCR 32MM HD STD NK (Orthopedic Implant) ×2 IMPLANT
HOOD PEEL AWAY FACE SHEILD DIS (HOOD) ×6 IMPLANT
JET LAVAGE IRRISEPT WOUND (IRRIGATION / IRRIGATOR) ×3 IMPLANT
KIT BASIN OR (CUSTOM PROCEDURE TRAY) ×3 IMPLANT
KIT TURNOVER KIT B (KITS) ×3 IMPLANT
LAVAGE JET IRRISEPT WOUND (IRRIGATION / IRRIGATOR) ×1 IMPLANT
LINER ACETAB NEUTRAL 7/C 32 (Liner) ×2 IMPLANT
MANIFOLD NEPTUNE II (INSTRUMENTS) ×3 IMPLANT
MARKER SKIN DUAL TIP RULER LAB (MISCELLANEOUS) ×6 IMPLANT
NDL SPNL 18GX3.5 QUINCKE PK (NEEDLE) ×1 IMPLANT
NEEDLE SPNL 18GX3.5 QUINCKE PK (NEEDLE) ×3 IMPLANT
NS IRRIG 1000ML POUR BTL (IV SOLUTION) ×3 IMPLANT
PACK TOTAL JOINT (CUSTOM PROCEDURE TRAY) ×3 IMPLANT
PACK UNIVERSAL I (CUSTOM PROCEDURE TRAY) ×3 IMPLANT
PAD ARMBOARD 7.5X6 YLW CONV (MISCELLANEOUS) ×6 IMPLANT
SAW OSC TIP CART 19.5X105X1.3 (SAW) ×3 IMPLANT
SEALER BIPOLAR AQUA 6.0 (INSTRUMENTS) ×2 IMPLANT
SET HNDPC FAN SPRY TIP SCT (DISPOSABLE) ×1 IMPLANT
SHELL ACET G7 3H 48 SZC (Shell) ×2 IMPLANT
SOL PREP POV-IOD 4OZ 10% (MISCELLANEOUS) ×3 IMPLANT
SPONGE T-LAP 18X18 ~~LOC~~+RFID (SPONGE) ×2 IMPLANT
STAPLER VISISTAT 35W (STAPLE) ×2 IMPLANT
STEM FEM CMTLS HO 9X137 123D (Stem) ×2 IMPLANT
SUT ETHIBOND NAB CT1 #1 30IN (SUTURE) ×6 IMPLANT
SUT MNCRL AB 3-0 PS2 18 (SUTURE) ×3 IMPLANT
SUT MON AB 2-0 CT1 36 (SUTURE) ×3 IMPLANT
SUT VIC AB 1 CT1 27 (SUTURE) ×3
SUT VIC AB 1 CT1 27XBRD ANBCTR (SUTURE) ×1 IMPLANT
SUT VIC AB 2-0 CT1 27 (SUTURE) ×3
SUT VIC AB 2-0 CT1 TAPERPNT 27 (SUTURE) ×1 IMPLANT
SUT VLOC 180 0 24IN GS25 (SUTURE) ×3 IMPLANT
SYR 50ML LL SCALE MARK (SYRINGE) ×3 IMPLANT
TOWEL GREEN STERILE (TOWEL DISPOSABLE) ×3 IMPLANT
TOWEL GREEN STERILE FF (TOWEL DISPOSABLE) ×3 IMPLANT
TRAY FOLEY W/BAG SLVR 16FR (SET/KITS/TRAYS/PACK)
TRAY FOLEY W/BAG SLVR 16FR ST (SET/KITS/TRAYS/PACK) IMPLANT
WATER STERILE IRR 1000ML POUR (IV SOLUTION) ×5 IMPLANT

## 2021-07-23 NOTE — Progress Notes (Signed)
Dr. Jonnie Finner notified of clot formation in HD lines and states okay to take patient off for today. Treatment terminated.

## 2021-07-23 NOTE — Plan of Care (Signed)

## 2021-07-23 NOTE — Consult Note (Signed)
° °  [facility] Inpatient Consult   07/23/2021  [name] [name] 03/06/1936 [id number]  [place]  Accountable Care Organization [ACO] Patient: [name] [name] Medicare  Primary Care Provider:  [name], MD, is an embedded provider with a Chronic Care Management team and program, and is listed for the transition of care follow up and appointments.  Patient was screened for Embedded practice service needs for chronic care management and patient has been active in the CCM team [RN and Pharmacist].   Plan: Notification to be sent to the [facility] Management team with disposition and follow up needs. Disposition is not known at this time. Will continue to follow.  Please contact for further questions,  Natividad Brood, RN BSN [facility] Liaison  [phone] business mobile phone Toll free office [phone]  Fax number: [phone] [email or web address]

## 2021-07-23 NOTE — Interval H&P Note (Signed)
History and Physical Interval Note:  07/23/2021 3:12 PM  [name] [name]  has presented today for surgery, with the diagnosis of Right Femoral Neck fracture.  The various methods of treatment have been discussed with the patient and family. After consideration of risks, benefits and other options for treatment, the patient has consented to  Procedure(s): TOTAL HIP ARTHROPLASTY ANTERIOR APPROACH (Right) as a surgical intervention.  The patient's history has been reviewed, patient examined, no change in status, stable for surgery.  I have reviewed the patient's chart and labs.  Questions were answered to the patient's satisfaction.    Discussion held with patients sisters and brother who is en route (via telephone)  [name]

## 2021-07-23 NOTE — Progress Notes (Signed)
[facility] Progress Note   Subjective: Seen in HD unit. No complaints this am. Pain controlled. For surgery this afternoon.   Objective Vitals:   07/23/21 0819 07/23/21 0857 07/23/21 0930 07/23/21 1000  BP: (!) 116/53 (!) 116/58 116/62 (!) 120/55  Pulse: 71 71 77 72  Resp: 16 (!) 22 (!) 23 (!) 22  Temp: (!) 97.5 F (36.4 C)     TempSrc: Oral     SpO2: 97%     Weight: 55.3 kg     Height:          Additional Objective Labs: Basic Metabolic Panel: Recent Labs  Lab 07/21/21 1454 07/22/21 0340 07/23/21 0623  NA 135 136 135  K 3.8 3.3* 3.8  CL 97* 103 101  CO2 27 27 25   GLUCOSE 168* 128* 139*  BUN 30* 33* 44*  CREATININE 4.98* 5.42* 6.64*  CALCIUM 9.0 8.3* 8.3*  PHOS  --   --  2.9   CBC: Recent Labs  Lab 07/21/21 1454 07/22/21 0340 07/23/21 0623  WBC 10.5 6.3 7.5  NEUTROABS 8.8*  --   --   HGB 11.3* 8.9* 9.0*  HCT 36.9 28.7* 29.0*  MCV 76.9* 76.3* 75.1*  PLT 234 183 214   Blood Culture    Component Value Date/Time   SDES  07/25/2020 1618    BLOOD RIGHT HAND Performed at [facility], [place] [address] [place], [place] [address]    SDES  07/25/2020 1618    BLOOD RIGHT HAND Performed at [facility], [place] [address], [place], [place] [address]    [place]  07/25/2020 1618    BOTTLES DRAWN AEROBIC ONLY Blood Culture results may not be optimal due to an inadequate volume of blood received in culture bottles Performed at [facility], [place] [address], [place], [place] [address]    SPECREQUEST  07/25/2020 1618    BOTTLES DRAWN AEROBIC ONLY Blood Culture adequate volume Performed at [facility] [address] [place], [place] [address]    CULT  07/25/2020 1618    NO GROWTH 5 DAYS Performed at [place] [address] [place], [place] [address]    CULT  07/25/2020 1618    NO GROWTH 5 DAYS Performed at [facility], [place] [address],  [place], [place] [address]    REPTSTATUS 07/30/2020 FINAL 07/25/2020 1618   REPTSTATUS 07/30/2020 FINAL 07/25/2020 1618     Physical Exam General: Well appearing, nad  Heart: RRR No m,r,g  Lungs: Clear bilaterally  Abdomen: soft non-tender  Extremities: No LE edema  Dialysis Access: LUE AVG +bruit   Medications:  sodium chloride     sodium chloride     tranexamic acid     vancomycin      amLODipine  10 mg Oral Daily   atorvastatin  20 mg Oral Daily   cloNIDine  0.1 mg Oral TID   heparin  5,000 Units Subcutaneous Q12H   hydrALAZINE  25 mg Oral TID WC   insulin aspart  0-6 Units Subcutaneous TID WC   loratadine  10 mg Oral Daily   metoprolol tartrate  12.5 mg Oral BID   polyethylene glycol  17 g Oral BID   povidone-iodine  2 application Topical Once    Dialysis Orders:  [place] MWF 3h 45 min 55kg 400/1.5A 2K/2.5Ca  AVG No heparin  Mircera 75 q 4ks (last 12/12) No VDRA   Assessment/Plan:  Right hip fx -- per  orthopedics. For surgery today   ESRD -  HD MWF. No urgent dialysis indications today. HD today on schedule.   Hypertension/volume  - Blood pressures controlled. No volume excess. UF to EDW as tolerated.   Anemia  - Hgb 8.9. Recent ESA dose as outpatient. Follow trends.   Metabolic bone disease -  Ca ok. No VDRA. Continue home binders   Nutrition - Renal diet/fluid restriction when taking PO.   Lynnda Child PA-C [facility] 07/23/2021,10:08 AM

## 2021-07-23 NOTE — Anesthesia Preprocedure Evaluation (Signed)
Anesthesia Evaluation  Patient identified by MRN, date of birth, ID band Patient awake    Reviewed: Allergy & Precautions, NPO status , Patient's Chart, lab work & pertinent test results, reviewed documented beta blocker date and time   Airway Mallampati: II  TM Distance: >3 FB Neck ROM: Full    Dental  (+) Dental Advisory Given, Edentulous Lower, Edentulous Upper   Pulmonary sleep apnea ,    Pulmonary exam normal breath sounds clear to auscultation       Cardiovascular hypertension, Pt. on medications and Pt. on home beta blockers +CHF  Normal cardiovascular exam Rhythm:Regular Rate:Normal     Neuro/Psych PSYCHIATRIC DISORDERS Dementia TIA   GI/Hepatic negative GI ROS, Neg liver ROS,   Endo/Other  negative endocrine ROSdiabetes, Type 2, Insulin Dependent  Renal/GU ESRF and DialysisRenal disease (MWF)     Musculoskeletal  (+) Arthritis , Right Femoral Neck fracture   Abdominal   Peds  Hematology  (+) Blood dyscrasia (Eliquis), anemia , Plt 214k   Anesthesia Other Findings Day of surgery medications reviewed with the patient.  Reproductive/Obstetrics                             Anesthesia Physical Anesthesia Plan  ASA: 3  Anesthesia Plan: General   Post-op Pain Management: Tylenol PO (pre-op)   Induction: Intravenous  PONV Risk Score and Plan: 3 and Dexamethasone and Ondansetron  Airway Management Planned: Oral ETT  Additional Equipment:   Intra-op Plan:   Post-operative Plan: Extubation in OR  Informed Consent: I have reviewed the patients History and Physical, chart, labs and discussed the procedure including the risks, benefits and alternatives for the proposed anesthesia with the patient or authorized representative who has indicated his/her understanding and acceptance.     Dental advisory given  Plan Discussed with: CRNA  Anesthesia Plan Comments:          Anesthesia Quick Evaluation

## 2021-07-23 NOTE — Progress Notes (Signed)
Low tmp noted. Lines flushed clot formation noted in venous chamber. Treatment terminated.

## 2021-07-23 NOTE — Anesthesia Procedure Notes (Signed)
Procedure Name: Intubation Date/Time: 07/23/2021 3:33 PM Performed by: Annamary Carolin, CRNA Pre-anesthesia Checklist: Patient identified, Emergency Drugs available, Suction available and Patient being monitored Patient Re-evaluated:Patient Re-evaluated prior to induction Oxygen Delivery Method: Circle System Utilized Preoxygenation: Pre-oxygenation with 100% oxygen Induction Type: IV induction Ventilation: Mask ventilation without difficulty Laryngoscope Size: 3 and Mac Tube type: Oral Tube size: 7.0 mm Number of attempts: 1 Airway Equipment and Method: Stylet Placement Confirmation: ETT inserted through vocal cords under direct vision, positive ETCO2 and breath sounds checked- equal and bilateral Tube secured with: Tape Dental Injury: Teeth and Oropharynx as per pre-operative assessment  Comments: With ease.

## 2021-07-23 NOTE — Transfer of Care (Signed)
Immediate Anesthesia Transfer of Care Note  Patient: [name] [name]  Procedure(s) Performed: TOTAL HIP ARTHROPLASTY ANTERIOR APPROACH (Right: Hip)  Patient Location: PACU  Anesthesia Type:General  Level of Consciousness: awake, alert  and patient cooperative  Airway & Oxygen Therapy: Patient Spontanous Breathing and Patient connected to face mask oxygen  Post-op Assessment: Report given to RN and Post -op Vital signs reviewed and stable  Post vital signs: Reviewed and stable  Last Vitals:  Vitals Value Taken Time  BP 122/44 07/23/21 1732  Temp    Pulse 75 07/23/21 1736  Resp 18 07/23/21 1736  SpO2 100 % 07/23/21 1736  Vitals shown include unvalidated device data.  Last Pain:  Vitals:   07/23/21 1425  TempSrc:   PainSc: 0-No pain         Complications: No notable events documented.

## 2021-07-23 NOTE — Op Note (Signed)
OPERATIVE REPORT  SURGEON: Rod Can, MD   ASSISTANT: Cherlynn June, PA-C  PREOPERATIVE DIAGNOSIS: Displaced Right femoral neck fracture.   POSTOPERATIVE DIAGNOSIS: Displaced Right femoral neck fracture.   PROCEDURE: Right total hip arthroplasty, anterior approach.   IMPLANTS: Biomet Taperloc Reduced Distal stem, size 9x173mm, high offset. Biomet G7 OsseoTi Cup, size 48 mm. Biomet Vivacit-E liner, size 32 mm, E, neutral. Biomet metal head ball, size 32 + 0 mm.  ANESTHESIA:  General  ANTIBIOTICS: 1g vancomycin.  ESTIMATED BLOOD LOSS:-150 mL    DRAINS: None.  COMPLICATIONS: None   CONDITION: PACU - hemodynamically stable.   BRIEF CLINICAL NOTE: [name] [name] is a 85 y.o. female with a displaced Right femoral neck fracture. The patient was admitted to the hospitalist service and underwent perioperative risk stratification and medical optimization. The risks, benefits, and alternatives to total hip arthroplasty were explained, and the patient elected to proceed.  PROCEDURE IN DETAIL: The patient was taken to the operating room and general anesthesia was induced on the hospital bed.  The patient was then positioned on the Hana table.  All bony prominences were well padded.  The hip was prepped and draped in the normal sterile surgical fashion.  A time-out was called verifying side and site of surgery. Antibiotics were given within 60 minutes of beginning the procedure.   Bikini incision was made, and the direct anterior approach to the hip was performed through the Hueter interval.  Lateral femoral circumflex vessels were treated with the Auqumantys. The anterior capsule was exposed and an inverted T capsulotomy was made.  Fracture hematoma was encountered and evacuated. The patient was found to have a comminuted Right subcapital femoral neck fracture.  I freshened the femoral neck cut with a saw.  I removed the femoral neck fragment.  A corkscrew was placed into the head  and the head was removed.  This was passed to the back table and was measured. The pubofemoral ligament was released subperiosteally to the lesser trochanter.  Acetabular exposure was achieved, and the pulvinar and labrum were excised. Sequential reaming of the acetabulum was then performed up to a size 47 mm reamer under direct visulization. A 48 mm cup was then opened and impacted into place at approximately 40 degrees of abduction and 20 degrees of anteversion. The final polyethylene liner was impacted into place and acetabular osteophytes were removed.    I then gained femoral exposure taking care to protect the abductors and greater trochanter.  This was performed using standard external rotation, extension, and adduction.  A cookie cutter was used to enter the femoral canal, and then the femoral canal finder was placed.  Sequential broaching was performed up to a size 9.  Calcar planer was used on the femoral neck remnant.  I placed a high offset neck and a trial head ball.  The hip was reduced.  Leg lengths and offset were checked fluoroscopically.  The hip was dislocated and trial components were removed.  The final implants were placed, and the hip was reduced.  Fluoroscopy was used to confirm component position and leg lengths.  At 90 degrees of external rotation and full extension, the hip was stable to an anterior directed force. A few millimeters of lengthening was required to restore adequate soft tissue tension and stability.   The wound was copiously irrigated with Irrisept solution and normal saline using pule lavage.  Marcaine solution was injected into the periarticular soft tissue.  The wound was closed in layers using #1  Stratafix for the fascia, 2-0 Vicryl for the subcutaneous fat, 2-0 Monocryl for the deep dermal layer, and staples + Dermabond for the skin.  Once the glue was fully dried, an Aquacell Ag dressing was applied.  The patient was transported to the recovery room in stable  condition.  Sponge, needle, and instrument counts were correct at the end of the case x2.  The patient tolerated the procedure well and there were no known complications.  Please note that a surgical assistant was a medical necessity for this procedure to perform it in a safe and expeditious manner. Assistant was necessary to provide appropriate retraction of vital neurovascular structures, to prevent femoral fracture, and to allow for anatomic placement of the prosthesis.

## 2021-07-23 NOTE — Progress Notes (Signed)
Initial Nutrition Assessment  DOCUMENTATION CODES:  Not applicable  INTERVENTION:  Advance diet as medically able and as tolerated.  Add Nepro Shake po BID, each supplement provides 425 kcal and 19 grams protein.  Add Rena-Vite daily.  Encourage PO and supplement intake.   NUTRITION DIAGNOSIS:  Increased nutrient needs related to chronic illness, post-op healing (ESRD on HD) as evidenced by estimated needs.  GOAL:  Patient will meet greater than or equal to 90% of their needs  MONITOR:  Diet advancement, PO intake, Supplement acceptance, Labs, Weight trends, Skin, I & O's  REASON FOR ASSESSMENT:  Consult Hip fracture protocol  ASSESSMENT:  85 yo female with a PMH of dementia, ESRD on HD, T2DM (controlled), essential HTN, paroxysmal atrial fibrillation, and ambulatory dysfunction comes in with right hip pain after mechanical fall. Admitted with R hip fracture. 12/16 - ORIF  Pt in HD this morning and went to OR this afternoon. Unable to visit patient today x2 attempts.  Pt ate 100% of lunch yesterday, per Epic.  Per Epic, pt has lost ~12 lbs (9%) in the last 7 months, which is significant and severe for the time frame.  Pt is likely malnourished to some degree, but RD cannot definitively diagnose at this time.  Of note, pt with mild BLE edema.  EDW: 55 kg UOP: 200 ml/24 hrs  Medications: reviewed; SSI, miralax  Labs: reviewed; BUN 44 (H - trending up), Crt 6.64 (H - trending up), CBG 109-237 (H) HbA1c: 6.4% (07/21/2021)  NUTRITION - FOCUSED PHYSICAL EXAM: Unable to perform - defer to follow-up  Diet Order:   Diet Order             Diet NPO time specified Except for: Sips with Meds  Diet effective midnight                  EDUCATION NEEDS:  Not appropriate for education at this time  Skin:  Skin Assessment: Reviewed RN Assessment  Last BM:  no BM documented  Height:  Ht Readings from Last 1 Encounters:  07/22/21 5' (1.524 m)   Weight:  Wt  Readings from Last 1 Encounters:  07/23/21 55.3 kg   BMI:  Body mass index is 23.81 kg/m.  Estimated Nutritional Needs:  Kcal:  1850-2050 Protein:  80-95 grams Fluid:  >1.85 L  [name], RD, LDN (she/her/hers) Clinical Inpatient Dietitian RD Pager/After-Hours/Weekend Pager # in [place]

## 2021-07-23 NOTE — Discharge Instructions (Addendum)
Dr. Rod Can Joint Replacement Specialist [facility] [address] [place], [place] [address] [phone]   TOTAL HIP REPLACEMENT POSTOPERATIVE DIRECTIONS    Hip Rehabilitation, Guidelines Following Surgery   WEIGHT BEARING Weight bearing as tolerated with assist device (walker, cane, etc) as directed, use it as Ven[place] as suggested by your surgeon or therapist, typically at least 4-6 weeks.  The results of a hip operation are greatly improved after range of motion and muscle strengthening exercises. Follow all safety measures which are given to protect your hip. If any of these exercises cause increased pain or swelling in your joint, decrease the amount until you are comfortable again. Then slowly increase the exercises. Call your caregiver if you have problems or questions.   HOME CARE INSTRUCTIONS  Most of the following instructions are designed to prevent the dislocation of your new hip.  Remove items at home which could result in a fall. This includes throw rugs or furniture in walking pathways.  Continue medications as instructed at time of discharge. You may have some home medications which will be placed on hold until you complete the course of blood thinner medication. You may start showering once you are discharged home. Do not remove your dressing. Do not put on socks or shoes without following the instructions of your caregivers.   Sit on chairs with arms. Use the chair arms to help push yourself up when arising.  Arrange for the use of a toilet seat elevator so you are not sitting low.  Walk with walker as instructed.  You may resume a sexual relationship in one month or when given the OK by your caregiver.  Use walker as [place]ley as suggested by your caregivers.  You may put full weight on your legs and walk as much as is comfortable. Avoid periods of inactivity such as sitting longer than an hour when not asleep. This helps prevent blood  clots.  You may return to work once you are cleared by Engineer, production.  Do not drive a car for 6 weeks or until released by your surgeon.  Do not drive while taking narcotics.  Wear elastic stockings for two weeks following surgery during the day but you may remove then at night.  Make sure you keep all of your appointments after your operation with all of your doctors and caregivers. You should call the office at the above phone number and make an appointment for approximately two weeks after the date of your surgery. Please pick up a stool softener and laxative for home use as Monnig as you are requiring pain medications. ICE to the affected hip every three hours for 30 minutes at a time and then as needed for pain and swelling. Continue to use ice on the hip for pain and swelling from surgery. You may notice swelling that will progress down to the foot and ankle.  This is normal after surgery.  Elevate the leg when you are not up walking on it.   It is important for you to complete the blood thinner medication as prescribed by your doctor. Continue to use the breathing machine which will help keep your temperature down.  It is common for your temperature to cycle up and down following surgery, especially at night when you are not up moving around and exerting yourself.  The breathing machine keeps your lungs expanded and your temperature down.  RANGE OF MOTION AND STRENGTHENING EXERCISES  These exercises are designed to help you  keep full movement of your hip joint. Follow your caregiver's or physical therapist's instructions. Perform all exercises about fifteen times, three times per day or as directed. Exercise both hips, even if you have had only one joint replacement. These exercises can be done on a training (exercise) mat, on the floor, on a table or on a bed. Use whatever works the best and is most comfortable for you. Use music or television while you are exercising so that the exercises are a  pleasant break in your day. This will make your life better with the exercises acting as a break in routine you can look forward to.  Lying on your back, slowly slide your foot toward your buttocks, raising your knee up off the floor. Then slowly slide your foot back down until your leg is straight again.  Lying on your back spread your legs as far apart as you can without causing discomfort.  Lying on your side, raise your upper leg and foot straight up from the floor as far as is comfortable. Slowly lower the leg and repeat.  Lying on your back, tighten up the muscle in the front of your thigh (quadriceps muscles). You can do this by keeping your leg straight and trying to raise your heel off the floor. This helps strengthen the largest muscle supporting your knee.  Lying on your back, tighten up the muscles of your buttocks both with the legs straight and with the knee bent at a comfortable angle while keeping your heel on the floor.   SKILLED REHAB INSTRUCTIONS: If the patient is transferred to a skilled rehab facility following release from the hospital, a list of the current medications will be sent to the facility for the patient to continue.  When discharged from the skilled rehab facility, please have the facility set up the patient's [place] prior to being released. Also, the skilled facility will be responsible for providing the patient with their medications at time of release from the facility to include their pain medication and their blood thinner medication. If the patient is still at the rehab facility at time of the two week follow up appointment, the skilled rehab facility will also need to assist the patient in arranging follow up appointment in our office and any transportation needs.  POST-OPERATIVE OPIOID TAPER INSTRUCTIONS: It is important to wean off of your opioid medication as soon as possible. If you do not need pain medication after your surgery it is ok  to stop day one. Opioids include: Codeine, Hydrocodone(Norco, Vicodin), Oxycodone(Percocet, oxycontin) and hydromorphone amongst others.  Ibach term and even short term use of opiods can cause: Increased pain response Dependence Constipation Depression Respiratory depression And more.  Withdrawal symptoms can include Flu like symptoms Nausea, vomiting And more Techniques to manage these symptoms Hydrate well Eat regular healthy meals Stay active Use relaxation techniques(deep breathing, meditating, yoga) Do Not substitute Alcohol to help with tapering If you have been on opioids for less than two weeks and do not have pain than it is ok to stop all together.  Plan to wean off of opioids This plan should start within one week post op of your joint replacement. Maintain the same interval or time between taking each dose and first decrease the dose.  Cut the total daily intake of opioids by one tablet each day Next start to increase the time between doses. The last dose that should be eliminated is the evening dose.    MAKE  SURE YOU:  Understand these instructions.  Will watch your condition.  Will get help right away if you are not doing well or get worse.  Pick up stool softner and laxative for home use following surgery while on pain medications. Do not remove your dressing. The dressing is waterproof--it is OK to take showers. Continue to use ice for pain and swelling after surgery. Do not use any lotions or creams on the incision until instructed by your surgeon. Total Hip Protocol.  Information on my medicine - ELIQUIS (apixaban)  This medication education was reviewed with me or my healthcare representative as part of my discharge preparation.  The pharmacist that spoke with me during my hospital stay was:    Why was Eliquis prescribed for you? Eliquis was prescribed for you to reduce the risk of a blood clot forming that can cause a stroke if you have a medical  condition called atrial fibrillation (a type of irregular heartbeat).  What do You need to know about Eliquis ? Take your Eliquis TWICE DAILY - one tablet in the morning and one tablet in the evening with or without food. If you have difficulty swallowing the tablet whole please discuss with your pharmacist how to take the medication safely.  Take Eliquis exactly as prescribed by your doctor and DO NOT stop taking Eliquis without talking to the doctor who prescribed the medication.  Stopping may increase your risk of developing a stroke.  Refill your prescription before you run out.  After discharge, you should have regular check-up appointments with your healthcare provider that is prescribing your Eliquis.  In the future your dose may need to be changed if your kidney function or weight changes by a significant amount or as you get older.  What do you do if you miss a dose? If you miss a dose, take it as soon as you remember on the same day and resume taking twice daily.  Do not take more than one dose of ELIQUIS at the same time to make up a missed dose.  Important Safety Information A possible side effect of Eliquis is bleeding. You should call your healthcare provider right away if you experience any of the following: Bleeding from an injury or your nose that does not stop. Unusual colored urine (red or dark brown) or unusual colored stools (red or black). Unusual bruising for unknown reasons. A serious fall or if you hit your head (even if there is no bleeding).  Some medicines may interact with Eliquis and might increase your risk of bleeding or clotting while on Eliquis. To help avoid this, consult your healthcare provider or pharmacist prior to using any new prescription or non-prescription medications, including herbals, vitamins, non-steroidal anti-inflammatory drugs (NSAIDs) and supplements.  This website has more information on Eliquis (apixaban):  http://[email or web address].com/eliquis/home

## 2021-07-23 NOTE — Progress Notes (Signed)
[facility] PROGRESS NOTE    Progress Note  [name]  GOT:[id number] DOB: 02/16/1936 DOA: 07/21/2021 PCP: [name], MD     Brief Narrative:   [name] [name] is an 85 y.o. female past medical history significant for end-stage renal disease on hemodialysis,  diabetes mellitus type 2 diet controlled, essential hypertension, paroxysmal atrial fibrillation on Eliquis ambulatory dysfunction comes in with right hip pain after mechanical fall.  She dropped denies any prodromal symptoms   Assessment/Plan:   Right hip fracture (HCC) secondary to mechanical fall: CT of the hip showed nondisplaced impacted subcapital right hip fracture, Eliquis has been held for washout.   Orthopedic surgery was consulted and plan for ORIF today 07/23/2021.  End-stage renal disease on hemodialysis: Renal was consulted patient on dialysis today.  She has been tolerating it well. Currently n.p.o. for surgical procedure.  Paroxysmal atrial fibrillation: Holding systemic anticoagulation now on DVT prophylaxis. Resume metoprolol.  Essential hypertension: Continue amlodipine, clonidine and metoprolol. Hold hydralazine.  Uncontrolled diabetes mellitus type 2: With an A1c of 6.4 continue sliding scale insulin. Currently n.p.o. continue current regimen relatively controlled  Moderate dementia: Continue Aricept.  Chronic diastolic heart failure: Appears to be euvolemic fluid management per renal.    DVT prophylaxis: lovenox Family Communication:DAughter in the room seem to be bothered by the questions Status is: Inpatient  Remains inpatient appropriate because: Acute hip fracture        Code Status:     Code Status Orders  (From admission, onward)           Start     Ordered   07/21/21 1800  Full code  Continuous        07/21/21 1802           Code Status History     Date Active Date Inactive Code Status Order ID Comments User Context    07/25/2020 0936 08/14/2020 2142 Full Code [id number]  Harold Hedge, MD ED   05/25/2015 2337 06/03/2015 1725 Full Code [id number]  Rise Patience, MD Inpatient   01/11/2014 0507 01/16/2014 1643 Full Code [id number]  Toy Baker, MD Inpatient         IV Access:   Peripheral IV   Procedures and diagnostic studies:   DG Pelvis 1-2 Views  Result Date: 07/21/2021 CLINICAL DATA:  Right hip pain after fall. EXAM: PELVIS - 1-2 VIEW COMPARISON:  None. FINDINGS: Acute minimally impacted right subcapital femoral neck fracture. No dislocation. The pubic symphysis and sacroiliac joints are intact. The hip joint spaces are preserved. Osteopenia. Soft tissues are unremarkable. IMPRESSION: 1. Acute minimally impacted right subcapital femoral neck fracture. Electronically Signed   By: Titus Dubin M.D.   On: 07/21/2021 14:11   CT Head Wo Contrast  Result Date: 07/21/2021 CLINICAL DATA:  Head trauma, fall EXAM: CT HEAD WITHOUT CONTRAST TECHNIQUE: Contiguous axial images were obtained from the base of the skull through the vertex without intravenous contrast. COMPARISON:  CT head examination dated November 19, 2020. FINDINGS: Brain: No evidence of acute infarction, hemorrhage, hydrocephalus, extra-axial collection or mass lesion/mass effect. Mild cerebral atrophy and chronic microvascular ischemic changes of the white matter, unchanged. Hypodensity in the right basal ganglia, which may represent lacunar infarct or ischemic changes. Vascular: Prominent atherosclerotic calcification of bilateral carotid siphons. Skull: Right high parietal scalp hematoma without evidence of calvarial fracture. Sinuses/Orbits: No acute finding. Other: None. IMPRESSION: 1.  No acute intracranial abnormality. 2. Right high parietal scalp hematoma without evidence of calvarial fracture.  Electronically Signed   By: Keane Police D.O.   On: 07/21/2021 14:08   CT Cervical Spine Wo Contrast  Addendum Date: 07/21/2021   ADDENDUM  REPORT: 07/21/2021 14:21 ADDENDUM: Following should be added to the impression. There is 1.7 cm low-density nodule in the left lobe of thyroid. When the patient's clinical condition permits, follow-up thyroid sonogram should be considered. Electronically Signed   By: Elmer Picker M.D.   On: 07/21/2021 14:21   Result Date: 07/21/2021 CLINICAL DATA:  Trauma trauma fall EXAM: CT CERVICAL SPINE WITHOUT CONTRAST TECHNIQUE: Multidetector CT imaging of the cervical spine was performed without intravenous contrast. Multiplanar CT image reconstructions were also generated. COMPARISON:  None. FINDINGS: Alignment: Alignment of posterior margins of vertebral bodies is within normal limits. Skull base and vertebrae: No recent fracture is seen. Degenerative changes are noted with bony spurs and facet hypertrophy at multiple levels. Soft tissues and spinal canal: There is mild to moderate spinal stenosis at C5-C6 and C6-C7 levels. Disc levels: There is mild to moderate encroachment of neural foramina from C3-C7 levels. Upper chest: Visualized apical portions of lung fields are unremarkable. Other: There is inhomogeneous attenuation in thyroid. There is 1.7 cm low-density nodule in the left lobe. IMPRESSION: No recent fracture is seen in the cervical spine. Osteopenia. Cervical spondylosis. There is spinal stenosis at C5-C6 and C6-C7 levels. There is encroachment of neural foramina from C3-C7 levels. Electronically Signed: By: Elmer Picker M.D. On: 07/21/2021 14:13   CT Hip Right Wo Contrast  Result Date: 07/21/2021 CLINICAL DATA:  [place].  Right hip pain. EXAM: CT OF THE RIGHT HIP WITHOUT CONTRAST TECHNIQUE: Multidetector CT imaging of the right hip was performed according to the standard protocol. Multiplanar CT image reconstructions were also generated. COMPARISON:  Radiographs, same date. FINDINGS: There is a nondisplaced and minimally impacted subcapital fracture. The visualized right hemipelvic bony  structures are intact. The right SI joint appears normal. No significant intrapelvic abnormalities are identified. IMPRESSION: Nondisplaced and minimally impacted subcapital fracture of the right hip. Electronically Signed   By: Marijo Sanes M.D.   On: 07/21/2021 18:14   DG FEMUR, MIN 2 VIEWS RIGHT  Result Date: 07/21/2021 CLINICAL DATA:  Fall, right hip pain EXAM: RIGHT FEMUR 2 VIEWS COMPARISON:  None. FINDINGS: Cortical irregularity of the femoral head neck junction concerning for nondisplaced impaction fracture. IMPRESSION: Cortical irregularity of the femoral head and neck junction concerning for nondisplaced impaction fracture. Further evaluation with CT or MRI examination would be helpful. Electronically Signed   By: Keane Police D.O.   On: 07/21/2021 14:15     Medical Consultants:   None.   Subjective:    Almira Coaster Corl awake today hungry has no complaints  Objective:    Vitals:   07/22/21 2334 07/23/21 0435 07/23/21 0801 07/23/21 0819  BP: (!) 123/55 (!) 116/55 (!) 118/59 (!) 116/53  Pulse: 74 73 72 71  Resp: 16 14 16 16   Temp: 98.4 F (36.9 C) 98.3 F (36.8 C) 98.7 F (37.1 C) (!) 97.5 F (36.4 C)  TempSrc: Oral Oral Oral Oral  SpO2: 97% 97%  97%  Weight:    55.3 kg  Height:       SpO2: 97 %   Intake/Output Summary (Last 24 hours) at 07/23/2021 0912 Last data filed at 07/23/2021 0647 Gross per 24 hour  Intake 360 ml  Output 200 ml  Net 160 ml   Filed Weights   07/22/21 1200 07/23/21 0819  Weight: 54.6 kg 55.3  kg    Exam: General exam: In no acute distress. Respiratory system: Good air movement and clear to auscultation. Cardiovascular system: S1 & S2 heard, RRR. No JVD. Gastrointestinal system: Abdomen is nondistended, soft and nontender.  Extremities: No pedal edema. Skin: No rashes, lesions or ulcers Psychiatry: Poor insight of medical condition   Data Reviewed:    Labs: Basic Metabolic Panel: Recent Labs  Lab 07/21/21 1454  07/22/21 0340 07/23/21 0623  NA 135 136 135  K 3.8 3.3* 3.8  CL 97* 103 101  CO2 27 27 25   GLUCOSE 168* 128* 139*  BUN 30* 33* 44*  CREATININE 4.98* 5.42* 6.64*  CALCIUM 9.0 8.3* 8.3*  PHOS  --   --  2.9    GFR Estimated Creatinine Clearance: 5 mL/min (A) (by C-G formula based on SCr of 6.64 mg/dL (H)). Liver Function Tests: Recent Labs  Lab 07/23/21 0623  ALBUMIN 2.5*   No results for input(s): LIPASE, AMYLASE in the last 168 hours. No results for input(s): AMMONIA in the last 168 hours. Coagulation profile No results for input(s): INR, PROTIME in the last 168 hours. COVID-19 Labs  No results for input(s): DDIMER, FERRITIN, LDH, CRP in the last 72 hours.  Lab Results  Component Value Date   SARSCOV2NAA NEGATIVE 07/21/2021   SARSCOV2NAA NEGATIVE 12/08/2020   SARSCOV2NAA POSITIVE (A) 07/25/2020    CBC: Recent Labs  Lab 07/21/21 1454 07/22/21 0340 07/23/21 0623  WBC 10.5 6.3 7.5  NEUTROABS 8.8*  --   --   HGB 11.3* 8.9* 9.0*  HCT 36.9 28.7* 29.0*  MCV 76.9* 76.3* 75.1*  PLT 234 183 214    Cardiac Enzymes: No results for input(s): CKTOTAL, CKMB, CKMBINDEX, TROPONINI in the last 168 hours. BNP (last 3 results) No results for input(s): PROBNP in the last 8760 hours. CBG: Recent Labs  Lab 07/22/21 0848 07/22/21 1213 07/22/21 1641 07/22/21 1952 07/23/21 0802  GLUCAP 172* 109* 198* 167* 135*    D-Dimer: No results for input(s): DDIMER in the last 72 hours. Hgb A1c: Recent Labs    07/21/21 1804  HGBA1C 6.4*    Lipid Profile: No results for input(s): CHOL, HDL, LDLCALC, TRIG, CHOLHDL, LDLDIRECT in the last 72 hours. Thyroid function studies: No results for input(s): TSH, T4TOTAL, T3FREE, THYROIDAB in the last 72 hours.  Invalid input(s): FREET3 Anemia work up: No results for input(s): VITAMINB12, FOLATE, FERRITIN, TIBC, IRON, RETICCTPCT in the last 72 hours. Sepsis Labs: Recent Labs  Lab 07/21/21 1454 07/22/21 0340 07/23/21 0623  WBC 10.5  6.3 7.5    Microbiology Recent Results (from the past 240 hour(s))  Resp Panel by RT-PCR (Flu A&B, Covid) Nasopharyngeal Swab     Status: None   Collection Time: 07/21/21  3:21 PM   Specimen: Nasopharyngeal Swab; Nasopharyngeal(NP) swabs in vial transport medium  Result Value Ref Range Status   SARS Coronavirus 2 by RT PCR NEGATIVE NEGATIVE Final    Comment: (NOTE) SARS-CoV-2 target nucleic acids are NOT DETECTED.  The SARS-CoV-2 RNA is generally detectable in upper respiratory specimens during the acute phase of infection. The lowest concentration of SARS-CoV-2 viral copies this assay can detect is 138 copies/mL. A negative result does not preclude SARS-Cov-2 infection and should not be used as the sole basis for treatment or other patient management decisions. A negative result may occur with  improper specimen collection/handling, submission of specimen other than nasopharyngeal swab, presence of viral mutation(s) within the areas targeted by this assay, and inadequate number of viral copies(<138  copies/mL). A negative result must be combined with clinical observations, patient history, and epidemiological information. The expected result is Negative.  Fact Sheet for Patients:  EntrepreneurPulse.com.au  Fact Sheet for Healthcare Providers:  IncredibleEmployment.be  This test is no t yet approved or cleared by the [place] FDA and  has been authorized for detection and/or diagnosis of SARS-CoV-2 by FDA under an Emergency Use Authorization (EUA). This EUA will remain  in effect (meaning this test can be used) for the duration of the COVID-19 declaration under Section 564(b)(1) of the Act, 21 U.S.C.section 360bbb-3(b)(1), unless the authorization is terminated  or revoked sooner.       Influenza A by PCR NEGATIVE NEGATIVE Final   Influenza B by PCR NEGATIVE NEGATIVE Final    Comment: (NOTE) The Xpert Xpress SARS-CoV-2/FLU/RSV plus  assay is intended as an aid in the diagnosis of influenza from Nasopharyngeal swab specimens and should not be used as a sole basis for treatment. Nasal washings and aspirates are unacceptable for Xpert Xpress SARS-CoV-2/FLU/RSV testing.  Fact Sheet for Patients: EntrepreneurPulse.com.au  Fact Sheet for Healthcare Providers: IncredibleEmployment.be  This test is not yet approved or cleared by the [place] FDA and has been authorized for detection and/or diagnosis of SARS-CoV-2 by FDA under an Emergency Use Authorization (EUA). This EUA will remain in effect (meaning this test can be used) for the duration of the COVID-19 declaration under Section 564(b)(1) of the Act, 21 U.S.C. section 360bbb-3(b)(1), unless the authorization is terminated or revoked.  Performed at [facility], [place] [address] [place], [place] [address]   Surgical pcr screen     Status: Abnormal   Collection Time: 07/22/21 11:53 PM   Specimen: Nasal Mucosa; Nasal Swab  Result Value Ref Range Status   MRSA, PCR NEGATIVE NEGATIVE Final   Staphylococcus aureus POSITIVE (A) NEGATIVE Final    Comment: (NOTE) The Xpert SA Assay (FDA approved for NASAL specimens in patients 88 years of age and older), is one component of a comprehensive surveillance program. It is not intended to diagnose infection nor to guide or monitor treatment. Performed at [facility], [place] [address], [place], [place] [address]      Medications:    amLODipine  10 mg Oral Daily   atorvastatin  20 mg Oral Daily   cloNIDine  0.1 mg Oral TID   heparin  5,000 Units Subcutaneous Q12H   hydrALAZINE  25 mg Oral TID WC   insulin aspart  0-6 Units Subcutaneous TID WC   loratadine  10 mg Oral Daily   metoprolol tartrate  12.5 mg Oral BID   polyethylene glycol  17 g Oral BID   povidone-iodine  2 application Topical Once   Continuous Infusions:  sodium chloride     sodium chloride      tranexamic acid     vancomycin        LOS: 2 days   Charlynne Cousins  [facility]  07/23/2021, 9:12 AM

## 2021-07-24 LAB — GLUCOSE, CAPILLARY
Glucose-Capillary: 136 mg/dL — ABNORMAL HIGH (ref 70–99)
Glucose-Capillary: 128 mg/dL — ABNORMAL HIGH (ref 70–99)
Glucose-Capillary: 140 mg/dL — ABNORMAL HIGH (ref 70–99)
Glucose-Capillary: 190 mg/dL — ABNORMAL HIGH (ref 70–99)
Glucose-Capillary: 134 mg/dL — ABNORMAL HIGH (ref 70–99)

## 2021-07-24 LAB — BASIC METABOLIC PANEL
Anion gap: 13 (ref 5–15)
BUN: 30 mg/dL — ABNORMAL HIGH (ref 8–23)
CO2: 21 mmol/L — ABNORMAL LOW (ref 22–32)
Calcium: 8.1 mg/dL — ABNORMAL LOW (ref 8.9–10.3)
Chloride: 99 mmol/L (ref 98–111)
Creatinine, Ser: 4.49 mg/dL — ABNORMAL HIGH (ref 0.44–1.00)
GFR, Estimated: 9 mL/min — ABNORMAL LOW (ref >60)
Glucose, Bld: 159 mg/dL — ABNORMAL HIGH (ref 70–99)
Potassium: 4.7 mmol/L (ref 3.5–5.1)
Sodium: 133 mmol/L — ABNORMAL LOW (ref 135–145)

## 2021-07-24 LAB — CBC
HCT: 28.1 % — ABNORMAL LOW (ref 36.0–46.0)
Hemoglobin: 8.5 g/dL — ABNORMAL LOW (ref 12.0–15.0)
MCH: 23.2 pg — ABNORMAL LOW (ref 26.0–34.0)
MCHC: 30.2 g/dL (ref 30.0–36.0)
MCV: 76.6 fL — ABNORMAL LOW (ref 80.0–100.0)
Platelets: 223 10*3/uL (ref 150–400)
RBC: 3.67 MIL/uL — ABNORMAL LOW (ref 3.87–5.11)
RDW: 17.9 % — ABNORMAL HIGH (ref 11.5–15.5)
WBC: 14.7 10*3/uL — ABNORMAL HIGH (ref 4.0–10.5)
nRBC: 0.3 % — ABNORMAL HIGH (ref 0.0–0.2)

## 2021-07-24 LAB — HEPATITIS B SURFACE ANTIBODY, QUANTITATIVE: Hep B S AB Quant (Post): >1000 m[IU]/mL (ref >9.9)

## 2021-07-24 MED ORDER — HALOPERIDOL LACTATE 5 MG/ML IJ SOLN
2.0000 mg | Freq: Once | INTRAMUSCULAR | Status: DC
  Filled 2021-07-24: qty 1

## 2021-07-24 MED ORDER — CHLORHEXIDINE GLUCONATE CLOTH 2 % EX PADS
6.0000 | MEDICATED_PAD | Freq: Every day | CUTANEOUS | Status: DC
  Administered 2021-07-24 – 2021-07-31 (×7): 6 via TOPICAL

## 2021-07-24 MED ORDER — HALOPERIDOL LACTATE 5 MG/ML IJ SOLN
2.0000 mg | Freq: Once | INTRAMUSCULAR | Status: AC
  Administered 2021-07-24: 2 mg via INTRAMUSCULAR

## 2021-07-24 MED ORDER — LANTHANUM CARBONATE 500 MG PO CHEW
1000.0000 mg | CHEWABLE_TABLET | Freq: Two times a day (BID) | ORAL | Status: DC
  Administered 2021-07-24 – 2021-07-30 (×10): 1000 mg via ORAL
  Filled 2021-07-24 (×14): qty 2

## 2021-07-24 MED ORDER — AMLODIPINE BESYLATE 5 MG PO TABS
5.0000 mg | ORAL_TABLET | Freq: Every day | ORAL | Status: DC
  Administered 2021-07-25: 12:00:00 5 mg via ORAL
  Filled 2021-07-24 (×3): qty 1

## 2021-07-24 MED ORDER — DONEPEZIL HCL 5 MG PO TABS
5.0000 mg | ORAL_TABLET | Freq: Every day | ORAL | Status: DC
  Administered 2021-07-25 – 2021-07-30 (×6): 5 mg via ORAL
  Filled 2021-07-24 (×7): qty 1

## 2021-07-24 NOTE — Evaluation (Signed)
Occupational Therapy Evaluation Patient Details Name: [name] [name] MRN: [id number] DOB: 1936-06-18 Today's Date: 07/24/2021   History of Present Illness Pt. is 85 yr old F admitted on 07/21/21 s/p fall resulting in R LE pain with inability to bear weight.  Imaging (+) for R subcapital femoral neck fx. Pt. underwent direct ant THA on 12/16. PMH: arthritis, CHF, CKD, dementia, DM, HTN, sleep apnea, TIA, ESRD on HD   Clinical Impression   Pt presents with decreased balance, strength, and activity tolerance. Currently requiring Min A for LB ADLs and functional transfers/mobility. Pt exhibiting some impulsivity and decreased awareness of safety/deficits during eval. Pt has good support from family who will be available to provide assistance as needed at home. Do not anticipate any further skilled OT needs after d/c. Will follow acutely to maximize safety/independence with ADLs and functional transfers/mobility prior to return home.      Recommendations for follow up therapy are one component of a multi-disciplinary discharge planning process, led by the attending physician.  Recommendations may be updated based on patient status, additional functional criteria and insurance authorization.   Follow Up Recommendations  No OT follow up    Assistance Recommended at Discharge Intermittent Supervision/Assistance  Functional Status Assessment  Patient has had a recent decline in their functional status and demonstrates the ability to make significant improvements in function in a reasonable and predictable amount of time.  Equipment Recommendations  None recommended by OT    Recommendations for Other Services       Precautions / Restrictions Precautions Precautions: Fall Restrictions Weight Bearing Restrictions: Yes RLE Weight Bearing: Weight bearing as tolerated      Mobility Bed Mobility Overal bed mobility: Needs Assistance Bed Mobility: Supine to Sit;Sit to Supine      Supine to sit: Min assist Sit to supine: Min assist       Transfers Overall transfer level: Needs assistance Equipment used: Rolling walker (2 wheels) Transfers: Sit to/from Stand Sit to Stand: Min assist                Balance Overall balance assessment: Needs assistance Sitting-balance support: Feet supported Sitting balance-Leahy Scale: Fair     Standing balance support: During functional activity Standing balance-Leahy Scale: Fair                             ADL either performed or assessed with clinical judgement   ADL Overall ADL's : Needs assistance/impaired Eating/Feeding: Independent   Grooming: Min guard;Standing   Upper Body Bathing: Supervision/ safety;Sitting   Lower Body Bathing: Minimal assistance;Sitting/lateral leans;Sit to/from stand   Upper Body Dressing : Supervision/safety   Lower Body Dressing: Minimal assistance;Sit to/from stand   Toilet Transfer: Minimal assistance;Cueing for safety;Ambulation;Regular Toilet;Rolling walker (2 wheels)   Toileting- Clothing Manipulation and Hygiene: Supervision/safety       Functional mobility during ADLs: Minimal assistance;Cueing for safety;Rolling walker (2 wheels)       Vision Baseline Vision/History: 1 Wears glasses Patient Visual Report: No change from baseline       Perception     Praxis      Pertinent Vitals/Pain Pain Assessment: No/denies pain Pain Score: 0-No pain     Hand Dominance Right   Extremity/Trunk Assessment Upper Extremity Assessment Upper Extremity Assessment: Overall WFL for tasks assessed          Communication Communication Communication: No difficulties   Cognition Arousal/Alertness: Awake/alert Behavior During Therapy: WFL for tasks assessed/performed  Overall Cognitive Status: Impaired/Different from baseline Area of Impairment: Memory;Attention;Following commands;Safety/judgement                   Current Attention Level:  Sustained Memory: Decreased short-term memory;Decreased recall of precautions Following Commands: Follows one step commands inconsistently Safety/Judgement: Decreased awareness of safety;Decreased awareness of deficits     General Comments: Pt presents with impulsivity and decreased attention to task, requiring frequent cues to maintain attention during functional activity. Very talkative and pleasant, motivated to participate.     General Comments  Family present and understands level of assist pt. will require at D/C, states they can accommodate pt needs at home.    Exercises     Shoulder Instructions      Home Living Family/patient expects to be discharged to:: Private residence Living Arrangements: Alone Available Help at Discharge: Family;Available 24 hours/day Type of Home: House Home Access: Level entry     Home Layout: One level     Bathroom Shower/Tub: Teacher, early years/pre: Standard     Home Equipment: Cane - single point;Wheelchair - Counsellor (4 wheels);BSC/3in1;Grab bars - tub/shower   Additional Comments: States she uses a SPC outside of home to prevent falls.      Prior Functioning/Environment Prior Level of Function : Needs assist               ADLs Comments: Assist with bathing for safety        OT Problem List: Decreased strength;Impaired balance (sitting and/or standing);Decreased activity tolerance;Decreased cognition;Decreased safety awareness;Decreased knowledge of use of DME or AE;Decreased knowledge of precautions;Cardiopulmonary status limiting activity      OT Treatment/Interventions: Self-care/ADL training;Therapeutic exercise;Energy conservation;DME and/or AE instruction;Therapeutic activities;Cognitive remediation/compensation;Patient/family education;Balance training    OT Goals(Current goals can be found in the care plan section) Acute Rehab OT Goals Patient Stated Goal: none stated OT Goal  Formulation: With patient/family Time For Goal Achievement: 08/07/21 Potential to Achieve Goals: Good  OT Frequency: Min 2X/week   Barriers to D/C:            Co-evaluation              AM-PAC OT "6 Clicks" Daily Activity     Outcome Measure Help from another person eating meals?: None Help from another person taking care of personal grooming?: A Little Help from another person toileting, which includes using toliet, bedpan, or urinal?: A Little Help from another person bathing (including washing, rinsing, drying)?: A Little Help from another person to put on and taking off regular upper body clothing?: A Little Help from another person to put on and taking off regular lower body clothing?: A Little 6 Click Score: 19   End of Session Equipment Utilized During Treatment: Rolling walker (2 wheels);Gait belt Nurse Communication: Mobility status  Activity Tolerance: Patient tolerated treatment well Patient left: in bed;with call bell/phone within reach;with nursing/sitter in room;with family/visitor present  OT Visit Diagnosis: Unsteadiness on feet (R26.81);Muscle weakness (generalized) (M62.81);Other symptoms and signs involving cognitive function                Time: 9622-2979 OT Time Calculation (min): 19 min Charges:  OT General Charges $OT Visit: 1 Visit OT Evaluation $OT Eval Low Complexity: 1 Low  Enos Muhl C, OT/L  Acute Rehab [place] 07/24/2021, 11:42 AM

## 2021-07-24 NOTE — TOC Initial Note (Signed)
Transition of Care [facility]) - Initial/Assessment Note    Patient Details  Name: [name] [name] MRN: [id number] Date of Birth: March 15, 1936  Transition of Care [name] CM/SW Contact:    Bartholomew Crews, RN Phone Number: [phone] 07/24/2021, 4:33 PM  Clinical Narrative:                  Spoke with patient at the bedside. Demographics verified. Consent received to talk to her daughter, [name] [name]. Patient stated that she is likely going home  with [place] at discharge. Patient agreeable to [facility] PT. Stated that she has a RW and 3/1.   Spoke with [place] on the phone. Verifed home address - [address], [place]. Agreeable to [facility]. Choice offered. [facility]) accepted referral for [facility] PT. Patient will need HH PT with Face to Face order.   TOC following for transition needs.   Expected Discharge Plan: [place] Barriers to Discharge: Continued Medical Work up   Patient Goals and CMS Choice Patient states their goals for this hospitalization and ongoing recovery are:: return home with her daughter CMS Medicare.gov Compare Post Acute Care list provided to:: Patient Choice offered to / list presented to : Patient, Adult Children  Expected Discharge Plan and Services Expected Discharge Plan: [place]   Discharge Planning Services: CM Consult Post Acute Care Choice: [place] arrangements for the past 2 months: Single Family Home                 DME Arranged: N/A DME Agency: NA       HH Arranged: PT HH Agency: [place] Date [place]: 07/24/21 Time [place]: 1631 Representative spoke with at [place]: Levada Dy  Prior Living Arrangements/Services Living arrangements for the past 2 months: [place] with:: Self Patient language and need for interpreter reviewed:: Yes Do you feel safe going back to the place where you live?: Yes      Need for Family Participation in  Patient Care: Yes (Comment) Care giver support system in place?: Yes (comment) Current home services: DME (RW, 3/1) Criminal Activity/Legal Involvement Pertinent to Current Situation/Hospitalization: No - Comment as needed  Activities of Daily Living Home Assistive Devices/Equipment: Cane (specify quad or straight), Bedside commode/3-in-1 ADL Screening (condition at time of admission) Patient's cognitive ability adequate to safely complete daily activities?: Yes Is the patient deaf or have difficulty hearing?: No Does the patient have difficulty seeing, even when wearing glasses/contacts?: No Does the patient have difficulty concentrating, remembering, or making decisions?: No Patient able to express need for assistance with ADLs?: No Does the patient have difficulty dressing or bathing?: Yes Independently performs ADLs?: No Does the patient have difficulty walking or climbing stairs?: No Weakness of Legs: Both Weakness of Arms/Hands: Both  Permission Sought/Granted Permission sought to share information with : Family Supports Permission granted to share information with : Yes, Verbal Permission Granted  Share Information with NAME: [name] [name]     Permission granted to share info w Relationship: daughter  Permission granted to share info w Contact Information: [phone]  Emotional Assessment Appearance:: Appears stated age Attitude/Demeanor/Rapport: Engaged Affect (typically observed): Accepting Orientation: : Oriented to Self, Oriented to Place, Oriented to  Time, Oriented to Situation Alcohol / Substance Use: Not Applicable Psych Involvement: No (comment)  Admission diagnosis:  Hip fracture ([place]) [S72.009A] Fall [W19.XXXA] Fall, initial encounter [id number]] Closed fracture of right hip, initial encounter [facility]) [S72.001A] Patient Active Problem  List   Diagnosis Date Noted   Hip fracture ([place]) 07/21/2021   Thrombocytopenia ([place]) 08/12/2020   ESRD (end stage renal disease)  ([place]) 08/12/2020   Delirium    Acute hypoxemic respiratory failure due to COVID-19 ([place]) 07/25/2020   CAP (community acquired pneumonia) 07/25/2020   CKD (chronic kidney disease), stage IV ([place]) 07/25/2020   AKI (acute kidney injury) ([place]) 07/25/2020   Sepsis ([place]) 07/25/2020   Atrial fibrillation with RVR ([place]) 07/25/2020   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 ([place]) 09/30/2018   Chronic idiopathic constipation 09/30/2018   Chronic diastolic HF (heart failure) ([place]) 05/26/2015   Type 2 diabetes mellitus with nephropathy ([place]) 05/26/2015   CHF (congestive heart failure) ([place]) 05/25/2015   Anasarca 05/25/2015   DM (diabetes mellitus), type 2 with renal complications ([place]) 53/61/4431   Chronic anemia 05/25/2015   Hypocalcemia    Encephalopathy, hypertensive 06/11/2014   Essential hypertension 06/11/2014   Type 2 diabetes mellitus without complication ([place]) 54/00/8676   HLD (hyperlipidemia) 06/11/2014   OSA (obstructive sleep apnea) 03/06/2014   Chronic renal disease, stage III ([place]) 01/13/2014   TIA (transient ischemic attack) 01/11/2014   Hypertensive urgency 01/11/2014   Diabetes mellitus ([place]) 01/11/2014   PCP:  [name], MD Pharmacy:   [facility] [place], [place] - [phone] [address] [place] [address] Phone: [phone] Fax: [phone]     Social Determinants of Health (SDOH) Interventions    Readmission Risk Interventions No flowsheet data found.

## 2021-07-24 NOTE — Progress Notes (Signed)
On this morning, while getting report, pt IV from the neck area was in the bed. Upon assessment there was no bleeding from the area, it was dry and intact

## 2021-07-24 NOTE — Progress Notes (Signed)
[facility] Progress Note   Subjective:  Completed dialysis yesterday net UF 1.3L  Hip repair yesterday Feels good this am, up in recliner, no complaints    Objective Vitals:   07/23/21 2025 07/24/21 0543 07/24/21 0854 07/24/21 1133  BP: (!) 109/40 (!) 92/39 (!) 93/36 (!) 94/50  Pulse: 82 84 85 87  Resp: 16 16 16 15   Temp: 97.6 F (36.4 C) 98.5 F (36.9 C) 98.5 F (36.9 C)   TempSrc: Oral Oral Oral   SpO2: 97% 99% 98%   Weight:      Height:          Additional Objective Labs: Basic Metabolic Panel: Recent Labs  Lab 07/22/21 0340 07/23/21 0623 07/24/21 0254  NA 136 135 133*  K 3.3* 3.8 4.7  CL 103 101 99  CO2 27 25 21*  GLUCOSE 128* 139* 159*  BUN 33* 44* 30*  CREATININE 5.42* 6.64* 4.49*  CALCIUM 8.3* 8.3* 8.1*  PHOS  --  2.9  --     CBC: Recent Labs  Lab 07/21/21 1454 07/22/21 0340 07/23/21 0623 07/24/21 0254  WBC 10.5 6.3 7.5 14.7*  NEUTROABS 8.8*  --   --   --   HGB 11.3* 8.9* 9.0* 8.5*  HCT 36.9 28.7* 29.0* 28.1*  MCV 76.9* 76.3* 75.1* 76.6*  PLT 234 183 214 223    Blood Culture    Component Value Date/Time   SDES  07/25/2020 1618    BLOOD RIGHT HAND Performed at [facility], [place] [address], [place], [place] [address]    SDES  07/25/2020 1618    BLOOD RIGHT HAND Performed at [facility], [place] [address] [place], [place] [address]    [place]  07/25/2020 1618    BOTTLES DRAWN AEROBIC ONLY Blood Culture results may not be optimal due to an inadequate volume of blood received in culture bottles Performed at [facility], [place] [address], [place], [place] [address]    SPECREQUEST  07/25/2020 1618    BOTTLES DRAWN AEROBIC ONLY Blood Culture adequate volume Performed at [place] [address], [place], [place] [address]    CULT  07/25/2020 1618    NO GROWTH 5 DAYS Performed at [place] [address], [place], [place]  [address]    CULT  07/25/2020 1618    NO GROWTH 5 DAYS Performed at [facility], [place] [address], [place], [place] [address]    REPTSTATUS 07/30/2020 FINAL 07/25/2020 1618   REPTSTATUS 07/30/2020 FINAL 07/25/2020 1618     Physical Exam General: Well appearing, nad  Heart: RRR No m,r,g  Lungs: Clear bilaterally  Abdomen: soft non-tender  Extremities: No LE edema  Dialysis Access: LUE AVG +bruit   Medications:    [START ON 07/25/2021] amLODipine  5 mg Oral Daily   apixaban  2.5 mg Oral BID   atorvastatin  20 mg Oral Daily   Chlorhexidine Gluconate Cloth  6 each Topical Daily   docusate sodium  100 mg Oral BID   donepezil  5 mg Oral QHS   insulin aspart  0-6 Units Subcutaneous TID WC   lanthanum  1,000 mg Oral BID WC   loratadine  10 mg Oral Daily   metoprolol tartrate  12.5 mg Oral BID   polyethylene glycol  17 g Oral BID   senna  1 tablet Oral BID    Dialysis Orders:  [place] MWF 3h 45 min 55kg 400/1.5A 2K/2.5Ca  AVG No heparin  Mircera 75 q 4ks (last 12/12) No VDRA   Assessment/Plan:  Right hip fx --s/p total hip replacement. Per orthopedics.   ESRD -  HD MWF. Continue on schedule. Next HD 12/19.   Hypertension/volume  - Blood pressures controlled. No volume excess. At dry weight.   Anemia  - Hgb 8.9. Recent ESA dose as outpatient. Follow trends.   Metabolic bone disease -  Ca ok. No VDRA. Continue home binders   Nutrition - Renal diet/fluid restriction when taking PO.   Lynnda Child PA-C [facility] 07/24/2021,11:40 AM

## 2021-07-24 NOTE — Progress Notes (Signed)
ANTICOAGULATION CONSULT NOTE - Initial Consult  Pharmacy Consult for Apixaban Indication: atrial fibrillation  Allergies  Allergen Reactions   Penicillins Swelling and Rash    Patient Measurements: Height: 5' (152.4 cm) Weight: 54.3 kg (119 lb 11.4 oz) IBW/kg (Calculated) : 45.5   Vital Signs: Temp: 98.5 F (36.9 C) (12/17 0854) Temp Source: Oral (12/17 0854) BP: 93/36 (12/17 0854) Pulse Rate: 85 (12/17 0854)  Labs: Recent Labs    07/22/21 0340 07/23/21 0623 07/24/21 0254  HGB 8.9* 9.0* 8.5*  HCT 28.7* 29.0* 28.1*  PLT 183 214 223  CREATININE 5.42* 6.64* 4.49*    Estimated Creatinine Clearance: 6.8 mL/min (A) (by C-G formula based on SCr of 4.49 mg/dL (H)).   Medical History: Past Medical History:  Diagnosis Date   Arthritis    RA   CHF (congestive heart failure) (HCC)    CKD (chronic kidney disease) stage 3, GFR 30-59 ml/min (HCC)    M-W-F dialysis   Dementia (HCC)    Diabetes mellitus without complication (HCC)    insulin dependent   Hyperlipidemia    Hypertension    Seasonal allergies    Shortness of breath dyspnea    occasional with exertion - no oxygen   Sleep apnea    does not use cpap   TIA (transient ischemic attack) 2015    Assessment: Patient presented to [facility] 12/14 with right hip pain 2/2 fall. Patient was on apixaban 2.5 BID for PAF (Scr>1.5, Age>80, Wt <60 kg). Apixaban was held at that time d/t scalp hematoma and plans for ORIF 12/16. Pharmacy was consulted to restart apixaban post-op if the Hgb >7, not requiring blood transfusion. H/H stable, PLT WNL. Apixaban restarted at home dose.  Goal of Therapy:  Monitor platelets by anticoagulation protocol: Yes   Plan:  START apixaban 2.5 BID  F/U CBC Monitor for signs and symptoms of bleeding   Adria Dill, PharmD PGY-1 Acute Care Resident  07/24/2021 9:46 AM

## 2021-07-24 NOTE — Anesthesia Postprocedure Evaluation (Signed)
Anesthesia Post Note  Patient: [name] [name]  Procedure(s) Performed: TOTAL HIP ARTHROPLASTY ANTERIOR APPROACH (Right: Hip)     Patient location during evaluation: PACU Anesthesia Type: General Level of consciousness: awake and alert Pain management: pain level controlled Vital Signs Assessment: post-procedure vital signs reviewed and stable Respiratory status: spontaneous breathing, nonlabored ventilation, respiratory function stable and patient connected to nasal cannula oxygen Cardiovascular status: blood pressure returned to baseline and stable Postop Assessment: no apparent nausea or vomiting Anesthetic complications: no   No notable events documented.  Last Vitals:  Vitals:   07/23/21 2025 07/24/21 0543  BP: (!) 109/40 (!) 92/39  Pulse: 82 84  Resp: 16 16  Temp: 36.4 C 36.9 C  SpO2: 97% 99%    Last Pain:  Vitals:   07/24/21 0543  TempSrc: Oral  PainSc:                  [place] S

## 2021-07-24 NOTE — Progress Notes (Signed)
° °  Subjective: 1 Day Post-Op Procedure(s) (LRB): TOTAL HIP ARTHROPLASTY ANTERIOR APPROACH (Right) Patient reports pain as mild.   Patient seen in rounds for Dr. Lyla Glassing. Patient is resting in bed on exam. She has her oldest son in the room with her. She was surprised by the fact that she has not had much pain in the hip. She has not been out of bed yet.  We will start therapy today.   Objective: Vital signs in last 24 hours: Temp:  [97.6 F (36.4 C)-98.7 F (37.1 C)] 98.5 F (36.9 C) (12/17 0854) Pulse Rate:  [71-94] 85 (12/17 0854) Resp:  [16-23] 16 (12/17 0854) BP: (90-144)/(36-66) 93/36 (12/17 0854) SpO2:  [92 %-100 %] 98 % (12/17 0854) Weight:  [54.3 kg] 54.3 kg (12/16 1150)  Intake/Output from previous day:  Intake/Output Summary (Last 24 hours) at 07/24/2021 0916 Last data filed at 07/24/2021 0341 Gross per 24 hour  Intake 648.83 ml  Output 1500 ml  Net -851.17 ml     Intake/Output this shift: No intake/output data recorded.  Labs: Recent Labs    07/21/21 1454 07/22/21 0340 07/23/21 0623 07/24/21 0254  HGB 11.3* 8.9* 9.0* 8.5*   Recent Labs    07/23/21 0623 07/24/21 0254  WBC 7.5 14.7*  RBC 3.86* 3.67*  HCT 29.0* 28.1*  PLT 214 223   Recent Labs    07/23/21 0623 07/24/21 0254  NA 135 133*  K 3.8 4.7  CL 101 99  CO2 25 21*  BUN 44* 30*  CREATININE 6.64* 4.49*  GLUCOSE 139* 159*  CALCIUM 8.3* 8.1*   No results for input(s): LABPT, INR in the last 72 hours.  Exam: General - Patient is Alert and Oriented Extremity - Neurologically intact Sensation intact distally Intact pulses distally Dorsiflexion/Plantar flexion intact Dressing - dressing C/D/I Motor Function - intact, moving foot and toes well on exam.   Past Medical History:  Diagnosis Date   Arthritis    RA   CHF (congestive heart failure) (HCC)    CKD (chronic kidney disease) stage 3, GFR 30-59 ml/min (HCC)    M-W-F dialysis   Dementia (HCC)    Diabetes mellitus without  complication (HCC)    insulin dependent   Hyperlipidemia    Hypertension    Seasonal allergies    Shortness of breath dyspnea    occasional with exertion - no oxygen   Sleep apnea    does not use cpap   TIA (transient ischemic attack) 2015    Assessment/Plan: 1 Day Post-Op Procedure(s) (LRB): TOTAL HIP ARTHROPLASTY ANTERIOR APPROACH (Right) Principal Problem:   Hip fracture (HCC)  Estimated body mass index is 23.38 kg/m as calculated from the following:   Height as of this encounter: 5' (1.524 m).   Weight as of this encounter: 54.3 kg. [place] diet Up with therapy  DVT Prophylaxis -  Eliquis Weight bearing as tolerated.  Plan to get up with PT today to begin working on ambulation and disposition planning. She lives at home with her daughter who is a [employer] scientist, so she does hope to be able to return there after discharge.   She has not enjoyed the food so far, and I recommend Ensure shakes to supplement if okay per her Nephrologist/medicine team.   Griffith Citron, PA-C Orthopedic Surgery [phone] 07/24/2021, 9:16 AM

## 2021-07-24 NOTE — Progress Notes (Signed)
[facility] PROGRESS NOTE    Progress Note  [name] [name]  QBH:[id number] DOB: 26-Mar-1936 DOA: 07/21/2021 PCP: [name], MD     Brief Narrative:   [name] [name] is an 85 y.o. female past medical history significant for end-stage renal disease on hemodialysis,  diabetes mellitus type 2 diet controlled, essential hypertension, paroxysmal atrial fibrillation on Eliquis ambulatory dysfunction comes in with right hip pain after mechanical fall.  She dropped denies any prodromal symptoms   Assessment/Plan:   Right hip fracture (HCC) secondary to mechanical fall: CT of the hip showed nondisplaced impacted subcapital right hip fracture, Eliquis has been held for washout.   Orthopedic surgery was consulted who performed right total hip arthroplasty with an anterior approach. Orthopedic surgery recommended to start Eliquis  End-stage renal disease on hemodialysis: Renal was consulted patient on dialysis today.  She has been tolerating it well. Tolerating her diet  Paroxysmal atrial fibrillation: Holding systemic anticoagulation now on DVT prophylaxis. Resume metoprolol.  Essential hypertension: Continue amlodipine and metoprolol. Hold hydralazine.  Discontinue clonidine blood pressure borderline.  Uncontrolled diabetes mellitus type 2: With an A1c of 6.4 continue sliding scale insulin. Tolerating her diet with minimal insulin requirement.  Moderate dementia: Continue Aricept.  Chronic diastolic heart failure: Appears to be euvolemic fluid management per renal.    DVT prophylaxis: lovenox Family Communication:DAughter in the room seem to be bothered by the questions Status is: Inpatient  Remains inpatient appropriate because: Acute hip fracture        Code Status:     Code Status Orders  (From admission, onward)           Start     Ordered   07/21/21 1800  Full code  Continuous        07/21/21 1802           Code Status  History     Date Active Date Inactive Code Status Order ID Comments User Context   07/25/2020 0936 08/14/2020 2142 Full Code [id number]  Harold Hedge, MD ED   05/25/2015 2337 06/03/2015 1725 Full Code [id number]  Rise Patience, MD Inpatient   01/11/2014 0507 01/16/2014 1643 Full Code [id number]  Toy Baker, MD Inpatient         IV Access:   Peripheral IV   Procedures and diagnostic studies:   Pelvis Portable  Result Date: 07/23/2021 CLINICAL DATA:  Status post total right hip replacement. EXAM: PORTABLE PELVIS 1-2 VIEWS COMPARISON:  July 23, 2021 FINDINGS: There is no evidence of pelvic fracture or diastasis. A total right hip replacement is seen without evidence of surrounding lucency to suggest the presence of hardware loosening or infection. No pelvic bone lesions are seen. A mild amount of soft tissue air is seen along the lateral aspect of the right hip. Multiple radiopaque skin staples are also noted within this region. IMPRESSION: Status post total right hip replacement without evidence of hardware complication. Electronically Signed   By: Virgina Norfolk M.D.   On: 07/23/2021 19:07   DG C-Arm 1-60 Min-No Report  Result Date: 07/23/2021 Fluoroscopy was utilized by the requesting physician.  No radiographic interpretation.   DG C-Arm 1-60 Min-No Report  Result Date: 07/23/2021 Fluoroscopy was utilized by the requesting physician.  No radiographic interpretation.   DG HIP OPERATIVE UNILAT WITH PELVIS RIGHT  Result Date: 07/23/2021 CLINICAL DATA:  Fluoroscopic assistance for right hip arthroplasty EXAM: OPERATIVE right HIP (WITH PELVIS IF PERFORMED) AP VIEWS TECHNIQUE: Fluoroscopic spot image(s) were submitted for  interpretation post-operatively. COMPARISON:  07/21/2021 FINDINGS: In the previous study subcapital neck fracture was seen in the proximal right femur. Fluoroscopic images show right hip arthroplasty. Fluoroscopic time was 12 seconds. Radiation dose  is 1.02 mGy. IMPRESSION: Fluoroscopic assistance was provided for right hip arthroplasty. Electronically Signed   By: Elmer Picker M.D.   On: 07/23/2021 18:14     Medical Consultants:   None.   Subjective:    [name] [name] no complaints today.  Objective:    Vitals:   07/23/21 1920 07/23/21 2025 07/24/21 0543 07/24/21 0854  BP: (!) 111/43 (!) 109/40 (!) 92/39 (!) 93/36  Pulse: 75 82 84 85  Resp: 17 16 16 16   Temp: 98.7 F (37.1 C) 97.6 F (36.4 C) 98.5 F (36.9 C) 98.5 F (36.9 C)  TempSrc:  Oral Oral Oral  SpO2: 92% 97% 99% 98%  Weight:      Height:       SpO2: 98 % O2 Flow Rate (L/min): 2 L/min   Intake/Output Summary (Last 24 hours) at 07/24/2021 0859 Last data filed at 07/24/2021 0341 Gross per 24 hour  Intake 648.83 ml  Output 1500 ml  Net -851.17 ml    Filed Weights   07/22/21 1200 07/23/21 0819 07/23/21 1150  Weight: 54.6 kg 55.3 kg 54.3 kg    Exam: General exam: In no acute distress. Respiratory system: Good air movement and clear to auscultation. Cardiovascular system: S1 & S2 heard, RRR. No JVD. Gastrointestinal system: Abdomen is nondistended, soft and nontender.  Extremities: No pedal edema. Skin: No rashes, lesions or ulcers Psychiatry: Judgement and insight appear normal. Mood & affect appropriate.   Data Reviewed:    Labs: Basic Metabolic Panel: Recent Labs  Lab 07/21/21 1454 07/22/21 0340 07/23/21 0623 07/24/21 0254  NA 135 136 135 133*  K 3.8 3.3* 3.8 4.7  CL 97* 103 101 99  CO2 27 27 25  21*  GLUCOSE 168* 128* 139* 159*  BUN 30* 33* 44* 30*  CREATININE 4.98* 5.42* 6.64* 4.49*  CALCIUM 9.0 8.3* 8.3* 8.1*  PHOS  --   --  2.9  --     GFR Estimated Creatinine Clearance: 6.8 mL/min (A) (by C-G formula based on SCr of 4.49 mg/dL (H)). Liver Function Tests: Recent Labs  Lab 07/23/21 0623  ALBUMIN 2.5*    No results for input(s): LIPASE, AMYLASE in the last 168 hours. No results for input(s): AMMONIA in  the last 168 hours. Coagulation profile No results for input(s): INR, PROTIME in the last 168 hours. COVID-19 Labs  No results for input(s): DDIMER, FERRITIN, LDH, CRP in the last 72 hours.  Lab Results  Component Value Date   SARSCOV2NAA NEGATIVE 07/21/2021   SARSCOV2NAA NEGATIVE 12/08/2020   SARSCOV2NAA POSITIVE (A) 07/25/2020    CBC: Recent Labs  Lab 07/21/21 1454 07/22/21 0340 07/23/21 0623 07/24/21 0254  WBC 10.5 6.3 7.5 14.7*  NEUTROABS 8.8*  --   --   --   HGB 11.3* 8.9* 9.0* 8.5*  HCT 36.9 28.7* 29.0* 28.1*  MCV 76.9* 76.3* 75.1* 76.6*  PLT 234 183 214 223    Cardiac Enzymes: No results for input(s): CKTOTAL, CKMB, CKMBINDEX, TROPONINI in the last 168 hours. BNP (last 3 results) No results for input(s): PROBNP in the last 8760 hours. CBG: Recent Labs  Lab 07/23/21 1517 07/23/21 1737 07/23/21 2023 07/24/21 0654 07/24/21 0852  GLUCAP 91 114* 150* 136* 128*    D-Dimer: No results for input(s): DDIMER in the last 72 hours.  Hgb A1c: Recent Labs    07/21/21 1804  HGBA1C 6.4*    Lipid Profile: No results for input(s): CHOL, HDL, LDLCALC, TRIG, CHOLHDL, LDLDIRECT in the last 72 hours. Thyroid function studies: No results for input(s): TSH, T4TOTAL, T3FREE, THYROIDAB in the last 72 hours.  Invalid input(s): FREET3 Anemia work up: No results for input(s): VITAMINB12, FOLATE, FERRITIN, TIBC, IRON, RETICCTPCT in the last 72 hours. Sepsis Labs: Recent Labs  Lab 07/21/21 1454 07/22/21 0340 07/23/21 0623 07/24/21 0254  WBC 10.5 6.3 7.5 14.7*    Microbiology Recent Results (from the past 240 hour(s))  Resp Panel by RT-PCR (Flu A&B, Covid) Nasopharyngeal Swab     Status: None   Collection Time: 07/21/21  3:21 PM   Specimen: Nasopharyngeal Swab; Nasopharyngeal(NP) swabs in vial transport medium  Result Value Ref Range Status   SARS Coronavirus 2 by RT PCR NEGATIVE NEGATIVE Final    Comment: (NOTE) SARS-CoV-2 target nucleic acids are NOT  DETECTED.  The SARS-CoV-2 RNA is generally detectable in upper respiratory specimens during the acute phase of infection. The lowest concentration of SARS-CoV-2 viral copies this assay can detect is 138 copies/mL. A negative result does not preclude SARS-Cov-2 infection and should not be used as the sole basis for treatment or other patient management decisions. A negative result may occur with  improper specimen collection/handling, submission of specimen other than nasopharyngeal swab, presence of viral mutation(s) within the areas targeted by this assay, and inadequate number of viral copies(<138 copies/mL). A negative result must be combined with clinical observations, patient history, and epidemiological information. The expected result is Negative.  Fact Sheet for Patients:  EntrepreneurPulse.com.au  Fact Sheet for Healthcare Providers:  IncredibleEmployment.be  This test is no t yet approved or cleared by the [place] FDA and  has been authorized for detection and/or diagnosis of SARS-CoV-2 by FDA under an Emergency Use Authorization (EUA). This EUA will remain  in effect (meaning this test can be used) for the duration of the COVID-19 declaration under Section 564(b)(1) of the Act, 21 U.S.C.section 360bbb-3(b)(1), unless the authorization is terminated  or revoked sooner.       Influenza A by PCR NEGATIVE NEGATIVE Final   Influenza B by PCR NEGATIVE NEGATIVE Final    Comment: (NOTE) The Xpert Xpress SARS-CoV-2/FLU/RSV plus assay is intended as an aid in the diagnosis of influenza from Nasopharyngeal swab specimens and should not be used as a sole basis for treatment. Nasal washings and aspirates are unacceptable for Xpert Xpress SARS-CoV-2/FLU/RSV testing.  Fact Sheet for Patients: EntrepreneurPulse.com.au  Fact Sheet for Healthcare Providers: IncredibleEmployment.be  This test is not yet  approved or cleared by the [place] FDA and has been authorized for detection and/or diagnosis of SARS-CoV-2 by FDA under an Emergency Use Authorization (EUA). This EUA will remain in effect (meaning this test can be used) for the duration of the COVID-19 declaration under Section 564(b)(1) of the Act, 21 U.S.C. section 360bbb-3(b)(1), unless the authorization is terminated or revoked.  Performed at [facility], [place] [address], [place], [place] [address]   Surgical pcr screen     Status: Abnormal   Collection Time: 07/22/21 11:53 PM   Specimen: Nasal Mucosa; Nasal Swab  Result Value Ref Range Status   MRSA, PCR NEGATIVE NEGATIVE Final   Staphylococcus aureus POSITIVE (A) NEGATIVE Final    Comment: (NOTE) The Xpert SA Assay (FDA approved for NASAL specimens in patients 53 years of age and older), is one component of a  comprehensive surveillance program. It is not intended to diagnose infection nor to guide or monitor treatment. Performed at [facility], [place] [address], [place], [place] [address]      Medications:    amLODipine  10 mg Oral Daily   apixaban  2.5 mg Oral BID   atorvastatin  20 mg Oral Daily   Chlorhexidine Gluconate Cloth  6 each Topical Daily   cloNIDine  0.1 mg Oral TID   docusate sodium  100 mg Oral BID   insulin aspart  0-6 Units Subcutaneous TID WC   loratadine  10 mg Oral Daily   metoprolol tartrate  12.5 mg Oral BID   polyethylene glycol  17 g Oral BID   senna  1 tablet Oral BID   Continuous Infusions:     LOS: 3 days   Charlynne Cousins  [facility]  07/24/2021, 8:59 AM

## 2021-07-24 NOTE — Plan of Care (Signed)

## 2021-07-24 NOTE — Evaluation (Signed)
Physical Therapy Evaluation Patient Details Name: [name] [name] MRN: [id number] DOB: 05-06-1936 Today's Date: 07/24/2021  History of Present Illness  Pt. is 85 yr old F admitted on 07/21/21 s/p fall resulting in R LE pain with inability to bear weight.  Imaging (+) for R subcapital femoral neck fx. Pt. underwent direct ant THA on 12/16. PMH: arthritis, CHF, CKD, dementia, DM, HTN, sleep apnea, TIA, ESRD on HD  Clinical Impression  Pt was previously independent and living along prior to fx of R hip requiring R direct ant THA.  Pt currently requiring min A for bed mobility and transfers and min A physically for amb with max Vcs for safety to negotiate RW.  Pt demos dec LE strength, dec balance, gait deviations, transfer difficulty and dec activity tolerance and would benefit from skilled PT to address deficits indicated in initial eval.  Pt and family would like to return home with [facility] PT and AMPAC score supports D/C plan.  Pt will be safe to return home with RW, BSC and min physical A from family for functional mobility with max A Vcs for safety.  Family present and aware of needs.     Recommendations for follow up therapy are one component of a multi-disciplinary discharge planning process, led by the attending physician.  Recommendations may be updated based on patient status, additional functional criteria and insurance authorization.  Follow Up Recommendations Home health PT    Assistance Recommended at Discharge Frequent or constant Supervision/Assistance  Functional Status Assessment Patient has had a recent decline in their functional status and demonstrates the ability to make significant improvements in function in a reasonable and predictable amount of time.  Equipment Recommendations  Rolling walker (2 wheels);BSC/3in1    Recommendations for Other Services       Precautions / Restrictions Precautions Precautions: Fall Restrictions Weight Bearing Restrictions: Yes RLE  Weight Bearing: Weight bearing as tolerated      Mobility  Bed Mobility Overal bed mobility: Needs Assistance Bed Mobility: Supine to Sit     Supine to sit: Min assist     General bed mobility comments: Min A for R LE negotiation and HHA.  Able to scoot to EOB with mod I and use of UEs. Patient Response: Impulsive;Cooperative  Transfers Overall transfer level: Needs assistance Equipment used: Rolling walker (2 wheels) Transfers: Sit to/from Stand Sit to Stand: Min assist           General transfer comment: Pt. req's min A to complete sit > stand with inc VC on safety with hand placement during transfers.  Pt. attempts to grab RW and does not reach back with UEs for support with sitting despite VCs.    Ambulation/Gait Ambulation/Gait assistance: Min assist Gait Distance (Feet): 20 Feet Assistive device: Rolling walker (2 wheels) Gait Pattern/deviations: Decreased step length - right;Decreased step length - left;Step-to pattern;Knee flexed in stance - right       General Gait Details: Pt demos poor safety awareness with amb.  Unable to follow appropriate sequencing and req's constant verbal reminders.  Often lets go of RW to grab onto furniture and will often position RW too close to furniture and get it stuck despite VCs to prepare pt. for turning, etc.  Needs constant assist to negotiate RW safely.  Pt. c/o inc fatigue with activity and often stops and leans over RW for rest breaks.  Pt. noted to have low BP prior to starting tx but denies dizziness and states low BP is normal for  her.  Financial trader [employer] (Stroke Patients Only)       Balance Overall balance assessment: Needs assistance Sitting-balance support: Feet supported Sitting balance-Leahy Scale: Fair     Standing balance support: Bilateral upper extremity supported;During functional activity Standing balance-Leahy Scale: Poor                                Pertinent Vitals/Pain Pain Assessment: 0-10 Pain Score: 0-No pain    Home Living Family/patient expects to be discharged to:: Private residence Living Arrangements: Alone Available Help at Discharge: Family;Available 24 hours/day (Family staying with her temporarily and can assist 24/7) Type of Home: House         Home Layout: One level Home Equipment: Cane - single point Additional Comments: States she uses a SPC outside of home to prevent falls.    Prior Function Prior Level of Function : Independent/Modified Independent               ADLs Comments: States daughter is a Marine scientist and assists her with bathing     Hand Dominance        Extremity/Trunk Assessment   Upper Extremity Assessment Upper Extremity Assessment: Defer to OT evaluation    Lower Extremity Assessment Lower Extremity Assessment: RLE deficits/detail RLE Deficits / Details: Grossly 3-/5       Communication   Communication: No difficulties  Cognition Arousal/Alertness: Awake/alert Behavior During Therapy: WFL for tasks assessed/performed Overall Cognitive Status: Impaired/Different from baseline Area of Impairment: Memory;Attention;Following commands;Safety/judgement                       Following Commands: Follows one step commands inconsistently       General Comments: Pt. is alert and oriented x 3 when PT arrives.  Wants to get up and use bathroom.  Family is present in room.        General Comments General comments (skin integrity, edema, etc.): Family present and understands level of assist pt. will require at D/C, states they can accommodate pt needs at home.    Exercises     Assessment/Plan    PT Assessment Patient needs continued PT services  PT Problem List Decreased strength;Decreased mobility;Decreased safety awareness;Decreased range of motion;Decreased coordination;Decreased knowledge of precautions;Decreased activity tolerance;Decreased  cognition;Decreased balance;Decreased knowledge of use of DME       PT Treatment Interventions DME instruction;Therapeutic exercise;Gait training;Balance training;Functional mobility training;Therapeutic activities;Patient/family education    PT Goals (Current goals can be found in the Care Plan section)  Acute Rehab PT Goals Patient Stated Goal: Pt's goal is to return home PT Goal Formulation: With patient/family Time For Goal Achievement: 08/07/21 Potential to Achieve Goals: Good    Frequency Min 5X/week   Barriers to discharge   N/A - family states they can provide 24/7 support    Co-evaluation               AM-PAC PT "6 Clicks" Mobility  Outcome Measure Help needed turning from your back to your side while in a flat bed without using bedrails?: A Little Help needed moving from lying on your back to sitting on the side of a flat bed without using bedrails?: A Little Help needed moving to and from a bed to a chair (including a wheelchair)?: A Little Help needed standing up from a chair using  your arms (e.g., wheelchair or bedside chair)?: A Little Help needed to walk in hospital room?: A Lot Help needed climbing 3-5 steps with a railing? : A Lot 6 Click Score: 16    End of Session Equipment Utilized During Treatment: Gait belt Activity Tolerance: Patient limited by fatigue Patient left: in chair;with call bell/phone within reach;with chair alarm set   PT Visit Diagnosis: Other abnormalities of gait and mobility (R26.89);Unsteadiness on feet (R26.81);History of falling (Z91.81)    Time: 5789-7847 PT Time Calculation (min) (ACUTE ONLY): 26 min   Charges:   PT Evaluation $PT Eval Low Complexity: 1 Low PT Treatments $Gait Training: 8-22 mins        Jesseka Drinkard A. Elsie Sakuma, PT, DPT Acute Rehabilitation Services Office: [place] 07/24/2021, 10:08 AM

## 2021-07-25 LAB — CBC
HCT: 21 % — ABNORMAL LOW (ref 36.0–46.0)
Hemoglobin: 6.9 g/dL — CL (ref 12.0–15.0)
MCH: 24 pg — ABNORMAL LOW (ref 26.0–34.0)
MCHC: 32.9 g/dL (ref 30.0–36.0)
MCV: 73.2 fL — ABNORMAL LOW (ref 80.0–100.0)
Platelets: 250 10*3/uL (ref 150–400)
RBC: 2.87 MIL/uL — ABNORMAL LOW (ref 3.87–5.11)
RDW: 17.4 % — ABNORMAL HIGH (ref 11.5–15.5)
WBC: 10.4 10*3/uL (ref 4.0–10.5)
nRBC: 0.3 % — ABNORMAL HIGH (ref 0.0–0.2)

## 2021-07-25 LAB — GLUCOSE, CAPILLARY
Glucose-Capillary: 151 mg/dL — ABNORMAL HIGH (ref 70–99)
Glucose-Capillary: 172 mg/dL — ABNORMAL HIGH (ref 70–99)

## 2021-07-25 LAB — BASIC METABOLIC PANEL
Anion gap: 13 (ref 5–15)
BUN: 53 mg/dL — ABNORMAL HIGH (ref 8–23)
CO2: 22 mmol/L (ref 22–32)
Calcium: 8.2 mg/dL — ABNORMAL LOW (ref 8.9–10.3)
Chloride: 96 mmol/L — ABNORMAL LOW (ref 98–111)
Creatinine, Ser: 6.37 mg/dL — ABNORMAL HIGH (ref 0.44–1.00)
GFR, Estimated: 6 mL/min — ABNORMAL LOW (ref >60)
Glucose, Bld: 208 mg/dL — ABNORMAL HIGH (ref 70–99)
Potassium: 4.1 mmol/L (ref 3.5–5.1)
Sodium: 131 mmol/L — ABNORMAL LOW (ref 135–145)

## 2021-07-25 LAB — PREPARE RBC (CROSSMATCH)

## 2021-07-25 MED ORDER — QUETIAPINE FUMARATE 25 MG PO TABS
25.0000 mg | ORAL_TABLET | Freq: Every evening | ORAL | Status: DC | PRN
  Administered 2021-07-25 – 2021-07-30 (×2): 25 mg via ORAL
  Filled 2021-07-25 (×2): qty 1

## 2021-07-25 MED ORDER — SODIUM CHLORIDE 0.9% IV SOLUTION: Freq: Once | INTRAVENOUS | Status: DC

## 2021-07-25 MED ORDER — APIXABAN 2.5 MG PO TABS: 2.5000 mg | ORAL_TABLET | Freq: Two times a day (BID) | ORAL | Status: DC

## 2021-07-25 MED ORDER — MELATONIN 3 MG PO TABS
3.0000 mg | ORAL_TABLET | Freq: Every day | ORAL | Status: DC
  Administered 2021-07-25 – 2021-07-30 (×6): 3 mg via ORAL
  Filled 2021-07-25 (×6): qty 1

## 2021-07-25 NOTE — Progress Notes (Addendum)
[facility] PROGRESS NOTE    Progress Note  [name] [name]  AOZ:[id number] DOB: 10-29-1935 DOA: 07/21/2021 PCP: [name], MD     Brief Narrative:   [name] [name] is an 85 y.o. female past medical history significant for end-stage renal disease on hemodialysis,  diabetes mellitus type 2 diet controlled, essential hypertension, paroxysmal atrial fibrillation on Eliquis ambulatory dysfunction comes in with right hip pain after mechanical fall.  She dropped denies any prodromal symptoms.CT of the hip showed nondisplaced impacted subcapital right hip fracture, Eliquis has been held for washout.  Orthopedic surgery was consulted who status post ORIF.   Assessment/Plan:   Right hip fracture (HCC) secondary to mechanical fall: Orthopedic surgery was consulted who performed right total hip arthroplasty with an anterior approach. Orthopedic surgery recommended, due to her drop in her hemoglobin we will hold Eliquis for 24 hours.   Physical therapy evaluated the patient recommended home health PT  End-stage renal disease on hemodialysis: Renal was consulted patient on dialysis today.  She has been tolerating it well. Tolerating her diet  Paroxysmal atrial fibrillation: Mild drop in hemoglobin hold Eliquis for 24 hours she is rate controlled continue metoprolol.  Essential hypertension: Continue amlodipine and metoprolol. Continue to hold clonidine and hydralazine blood pressure 139/65 from amlodipine and metoprolol.  Uncontrolled diabetes mellitus type 2: With an A1c of 6.4 continue sliding scale insulin. Tolerating her diet with minimal insulin requirement.  Moderate dementia: Continue Aricept.  Chronic diastolic heart failure: Appears to be euvolemic fluid management per renal.  Anemia of chronic renal disease/acute blood loss anemia: Likely due to surgical intervention transfuse 1 unit of packed red blood cells check CBC posttransfusion.  Acute  confusional state: She did not receive any narcotics or benzodiazepines, will use melatonin at night Seroquel for agitation.  She is nonfocal on physical exam  DVT prophylaxis: Eliquis Family Communication: Son at bedside Status is: Inpatient  Remains inpatient appropriate because: Acute hip fracture    Code Status:     Code Status Orders  (From admission, onward)           Start     Ordered   07/21/21 1800  Full code  Continuous        07/21/21 1802           Code Status History     Date Active Date Inactive Code Status Order ID Comments User Context   07/25/2020 0936 08/14/2020 2142 Full Code [id number]  Harold Hedge, MD ED   05/25/2015 2337 06/03/2015 1725 Full Code [id number]  Rise Patience, MD Inpatient   01/11/2014 0507 01/16/2014 1643 Full Code [id number]  Toy Baker, MD Inpatient         IV Access:   Peripheral IV   Procedures and diagnostic studies:   Pelvis Portable  Result Date: 07/23/2021 CLINICAL DATA:  Status post total right hip replacement. EXAM: PORTABLE PELVIS 1-2 VIEWS COMPARISON:  July 23, 2021 FINDINGS: There is no evidence of pelvic fracture or diastasis. A total right hip replacement is seen without evidence of surrounding lucency to suggest the presence of hardware loosening or infection. No pelvic bone lesions are seen. A mild amount of soft tissue air is seen along the lateral aspect of the right hip. Multiple radiopaque skin staples are also noted within this region. IMPRESSION: Status post total right hip replacement without evidence of hardware complication. Electronically Signed   By: Virgina Norfolk M.D.   On: 07/23/2021 19:07   DG C-Arm 1-60  Min-No Report  Result Date: 07/23/2021 Fluoroscopy was utilized by the requesting physician.  No radiographic interpretation.   DG C-Arm 1-60 Min-No Report  Result Date: 07/23/2021 Fluoroscopy was utilized by the requesting physician.  No radiographic interpretation.    DG HIP OPERATIVE UNILAT WITH PELVIS RIGHT  Result Date: 07/23/2021 CLINICAL DATA:  Fluoroscopic assistance for right hip arthroplasty EXAM: OPERATIVE right HIP (WITH PELVIS IF PERFORMED) AP VIEWS TECHNIQUE: Fluoroscopic spot image(s) were submitted for interpretation post-operatively. COMPARISON:  07/21/2021 FINDINGS: In the previous study subcapital neck fracture was seen in the proximal right femur. Fluoroscopic images show right hip arthroplasty. Fluoroscopic time was 12 seconds. Radiation dose is 1.02 mGy. IMPRESSION: Fluoroscopic assistance was provided for right hip arthroplasty. Electronically Signed   By: Elmer Picker M.D.   On: 07/23/2021 18:14     Medical Consultants:   None.   Subjective:    [name] [name] Old confused this morning.  Objective:    Vitals:   07/24/21 1945 07/24/21 2125 07/25/21 0030 07/25/21 0416  BP: (!) 120/47 (!) 173/71 (!) 148/60 139/65  Pulse: 75 (!) 109 (!) 106 (!) 108  Resp:  18 20 18   Temp: 98.1 F (36.7 C) 97.9 F (36.6 C) 98.4 F (36.9 C) 97.9 F (36.6 C)  TempSrc: Oral Oral Oral Oral  SpO2: 97% 99% 99% 98%  Weight:      Height:       SpO2: 98 % O2 Flow Rate (L/min): 2 L/min   Intake/Output Summary (Last 24 hours) at 07/25/2021 0800 Last data filed at 07/24/2021 1811 Gross per 24 hour  Intake 480 ml  Output 300 ml  Net 180 ml    Filed Weights   07/22/21 1200 07/23/21 0819 07/23/21 1150  Weight: 54.6 kg 55.3 kg 54.3 kg    Exam: General exam: In no acute distress. Respiratory system: Good air movement and clear to auscultation. Cardiovascular system: S1 & S2 heard, RRR. No JVD. Gastrointestinal system: Abdomen is nondistended, soft and nontender.  Extremities: No pedal edema. Skin: No rashes, lesions or ulcers Psychiatry: No judgment or insight of medical condition, she is only alert to person.   Data Reviewed:    Labs: Basic Metabolic Panel: Recent Labs  Lab 07/21/21 1454 07/22/21 0340  07/23/21 0623 07/24/21 0254  NA 135 136 135 133*  K 3.8 3.3* 3.8 4.7  CL 97* 103 101 99  CO2 27 27 25  21*  GLUCOSE 168* 128* 139* 159*  BUN 30* 33* 44* 30*  CREATININE 4.98* 5.42* 6.64* 4.49*  CALCIUM 9.0 8.3* 8.3* 8.1*  PHOS  --   --  2.9  --     GFR Estimated Creatinine Clearance: 6.8 mL/min (A) (by C-G formula based on SCr of 4.49 mg/dL (H)). Liver Function Tests: Recent Labs  Lab 07/23/21 0623  ALBUMIN 2.5*    No results for input(s): LIPASE, AMYLASE in the last 168 hours. No results for input(s): AMMONIA in the last 168 hours. Coagulation profile No results for input(s): INR, PROTIME in the last 168 hours. COVID-19 Labs  No results for input(s): DDIMER, FERRITIN, LDH, CRP in the last 72 hours.  Lab Results  Component Value Date   SARSCOV2NAA NEGATIVE 07/21/2021   SARSCOV2NAA NEGATIVE 12/08/2020   SARSCOV2NAA POSITIVE (A) 07/25/2020    CBC: Recent Labs  Lab 07/21/21 1454 07/22/21 0340 07/23/21 0623 07/24/21 0254 07/25/21 0726  WBC 10.5 6.3 7.5 14.7* 10.4  NEUTROABS 8.8*  --   --   --   --   HGB  11.3* 8.9* 9.0* 8.5* 6.9*  HCT 36.9 28.7* 29.0* 28.1* 21.0*  MCV 76.9* 76.3* 75.1* 76.6* 73.2*  PLT 234 183 214 223 250    Cardiac Enzymes: No results for input(s): CKTOTAL, CKMB, CKMBINDEX, TROPONINI in the last 168 hours. BNP (last 3 results) No results for input(s): PROBNP in the last 8760 hours. CBG: Recent Labs  Lab 07/24/21 0654 07/24/21 0852 07/24/21 1156 07/24/21 1607 07/24/21 2126  GLUCAP 136* 128* 140* 190* 134*    D-Dimer: No results for input(s): DDIMER in the last 72 hours. Hgb A1c: No results for input(s): HGBA1C in the last 72 hours.  Lipid Profile: No results for input(s): CHOL, HDL, LDLCALC, TRIG, CHOLHDL, LDLDIRECT in the last 72 hours. Thyroid function studies: No results for input(s): TSH, T4TOTAL, T3FREE, THYROIDAB in the last 72 hours.  Invalid input(s): FREET3 Anemia work up: No results for input(s): VITAMINB12, FOLATE,  FERRITIN, TIBC, IRON, RETICCTPCT in the last 72 hours. Sepsis Labs: Recent Labs  Lab 07/22/21 0340 07/23/21 0623 07/24/21 0254 07/25/21 0726  WBC 6.3 7.5 14.7* 10.4    Microbiology Recent Results (from the past 240 hour(s))  Resp Panel by RT-PCR (Flu A&B, Covid) Nasopharyngeal Swab     Status: None   Collection Time: 07/21/21  3:21 PM   Specimen: Nasopharyngeal Swab; Nasopharyngeal(NP) swabs in vial transport medium  Result Value Ref Range Status   SARS Coronavirus 2 by RT PCR NEGATIVE NEGATIVE Final    Comment: (NOTE) SARS-CoV-2 target nucleic acids are NOT DETECTED.  The SARS-CoV-2 RNA is generally detectable in upper respiratory specimens during the acute phase of infection. The lowest concentration of SARS-CoV-2 viral copies this assay can detect is 138 copies/mL. A negative result does not preclude SARS-Cov-2 infection and should not be used as the sole basis for treatment or other patient management decisions. A negative result may occur with  improper specimen collection/handling, submission of specimen other than nasopharyngeal swab, presence of viral mutation(s) within the areas targeted by this assay, and inadequate number of viral copies(<138 copies/mL). A negative result must be combined with clinical observations, patient history, and epidemiological information. The expected result is Negative.  Fact Sheet for Patients:  EntrepreneurPulse.com.au  Fact Sheet for Healthcare Providers:  IncredibleEmployment.be  This test is no t yet approved or cleared by the [place] FDA and  has been authorized for detection and/or diagnosis of SARS-CoV-2 by FDA under an Emergency Use Authorization (EUA). This EUA will remain  in effect (meaning this test can be used) for the duration of the COVID-19 declaration under Section 564(b)(1) of the Act, 21 U.S.C.section 360bbb-3(b)(1), unless the authorization is terminated  or revoked  sooner.       Influenza A by PCR NEGATIVE NEGATIVE Final   Influenza B by PCR NEGATIVE NEGATIVE Final    Comment: (NOTE) The Xpert Xpress SARS-CoV-2/FLU/RSV plus assay is intended as an aid in the diagnosis of influenza from Nasopharyngeal swab specimens and should not be used as a sole basis for treatment. Nasal washings and aspirates are unacceptable for Xpert Xpress SARS-CoV-2/FLU/RSV testing.  Fact Sheet for Patients: EntrepreneurPulse.com.au  Fact Sheet for Healthcare Providers: IncredibleEmployment.be  This test is not yet approved or cleared by the [place] FDA and has been authorized for detection and/or diagnosis of SARS-CoV-2 by FDA under an Emergency Use Authorization (EUA). This EUA will remain in effect (meaning this test can be used) for the duration of the COVID-19 declaration under Section 564(b)(1) of the Act, 21 U.S.C. section 360bbb-3(b)(1), unless the  authorization is terminated or revoked.  Performed at [facility], [place] [address], [place], [place] [address]   Surgical pcr screen     Status: Abnormal   Collection Time: 07/22/21 11:53 PM   Specimen: Nasal Mucosa; Nasal Swab  Result Value Ref Range Status   MRSA, PCR NEGATIVE NEGATIVE Final   Staphylococcus aureus POSITIVE (A) NEGATIVE Final    Comment: (NOTE) The Xpert SA Assay (FDA approved for NASAL specimens in patients 64 years of age and older), is one component of a comprehensive surveillance program. It is not intended to diagnose infection nor to guide or monitor treatment. Performed at [facility], [place] [address], [place], [place] [address]      Medications:    amLODipine  5 mg Oral Daily   apixaban  2.5 mg Oral BID   atorvastatin  20 mg Oral Daily   Chlorhexidine Gluconate Cloth  6 each Topical Daily   docusate sodium  100 mg Oral BID   donepezil  5 mg Oral QHS   insulin aspart  0-6 Units Subcutaneous TID WC   lanthanum  1,000  mg Oral BID WC   loratadine  10 mg Oral Daily   metoprolol tartrate  12.5 mg Oral BID   polyethylene glycol  17 g Oral BID   senna  1 tablet Oral BID   Continuous Infusions:     LOS: 4 days   [name] [name]  [facility]  07/25/2021, 8:00 AM

## 2021-07-25 NOTE — TOC Progression Note (Signed)
Transition of Care [facility]) - Progression Note    Patient Details  Name: [name] [name] MRN: [id number] Date of Birth: [birth year]  [facility]) CM/SW Contact  Bartholomew Crews, RN Phone Number: [phone] 07/25/2021, 2:29 PM  Clinical Narrative:     Spoke with patient and her daughter [name]) at the bedside to discuss DME needs. Referral to [facility] for hospital bed, RW, and 3/1. Family anticipates being able to transport home. [facility] following for PT. TOC following for transition needs.   Expected Discharge Plan: [place] Barriers to Discharge: Continued Medical Work up  Expected Discharge Plan and Services Expected Discharge Plan: [place]   Discharge Planning Services: CM Consult Post Acute Care Choice: [place] arrangements for the past 2 months: Single Family Home                 DME Arranged: 3-N-1, Hospital bed, Walker rolling DME Agency: [facility] Date DME Agency Contacted: 07/25/21 Time DME Agency Contacted: [phone] Representative spoke with at DME Agency: [place]: PT [place]: [place] Date [place]: 07/24/21 Time [place]: 1631 Representative spoke with at [place]: [place] ([place]) Interventions    Readmission Risk Interventions No flowsheet data found.

## 2021-07-25 NOTE — Progress Notes (Signed)
Physical Therapy Treatment Patient Details Name: [name] [name] MRN: [id number] DOB: May 28, 1936 Today's Date: 07/25/2021   History of Present Illness Pt. is 85 yr old F admitted on 07/21/21 s/p fall resulting in R LE pain with inability to bear weight.  Imaging (+) for R subcapital femoral neck fx. Pt. underwent direct ant THA on 12/16. PMH: arthritis, CHF, CKD, dementia, DM, HTN, sleep apnea, TIA, ESRD on HD    PT Comments    Pt received in bed with bilat wrist restraints and mittens in place. Per chart, pt had a fall overnight. She is presenting with increased confusion and agitation this morning. Minimal verbal interactions with therapist, keeping eyes closed most of time. Only tolerated minimal LE exercises before resisting all further mobility attempts, stating "I know what you want but I'm not doing it." Of note, hgb 6.9 and scheduled to receive 1 unit PRBCs today. PT to continue per POC.    Recommendations for follow up therapy are one component of a multi-disciplinary discharge planning process, led by the attending physician.  Recommendations may be updated based on patient status, additional functional criteria and insurance authorization.  Follow Up Recommendations  Home health PT     Assistance Recommended at Discharge Frequent or constant Supervision/Assistance  Equipment Recommendations  Rolling walker (2 wheels);BSC/3in1    Recommendations for Other Services       Precautions / Restrictions Precautions Precautions: Fall Restrictions RLE Weight Bearing: Weight bearing as tolerated     Mobility  Bed Mobility               General bed mobility comments: Pt resisting all mobility attempts.    Transfers                        Ambulation/Gait                   Stairs             Wheelchair Mobility    Modified Rankin (Stroke Patients Only)       Balance                                             Cognition Arousal/Alertness: Awake/alert Behavior During Therapy: Agitated;Flat affect Overall Cognitive Status: Impaired/Different from baseline Area of Impairment: Memory;Attention;Following commands;Safety/judgement;Orientation;Awareness;Problem solving                 Orientation Level: Disoriented to;Situation;Time Current Attention Level: Focused Memory: Decreased short-term memory;Decreased recall of precautions Following Commands: Follows one step commands inconsistently Safety/Judgement: Decreased awareness of safety;Decreased awareness of deficits Awareness: Intellectual Problem Solving: Slow processing;Decreased initiation;Difficulty sequencing;Requires verbal cues;Requires tactile cues General Comments: Increased confusion and agitation today.        Exercises Total Joint Exercises Ankle Circles/Pumps: AAROM;PROM;Right;Left;10 reps;Supine Heel Slides: AAROM;PROM;Right;5 reps;Supine Hip ABduction/ADduction: PROM;AAROM;Supine;Right;5 reps Straight Leg Raises: AAROM;PROM;Right;5 reps;Supine    General Comments        Pertinent Vitals/Pain Pain Assessment: Faces Faces Pain Scale: Hurts a little bit Pain Location: R hip Pain Descriptors / Indicators: Grimacing;Discomfort Pain Intervention(s): Limited activity within patient's tolerance    Home Living                          Prior Function            PT Goals (  current goals can now be found in the care plan section) Acute Rehab PT Goals Patient Stated Goal: not stated Progress towards PT goals: Not progressing toward goals - comment (increased confusion and agitation)    Frequency    Min 5X/week      PT Plan Current plan remains appropriate    Co-evaluation              AM-PAC PT "6 Clicks" Mobility   Outcome Measure  Help needed turning from your back to your side while in a flat bed without using bedrails?: A Little Help needed moving from lying on your back to sitting on  the side of a flat bed without using bedrails?: A Little Help needed moving to and from a bed to a chair (including a wheelchair)?: A Little Help needed standing up from a chair using your arms (e.g., wheelchair or bedside chair)?: A Little Help needed to walk in hospital room?: A Lot Help needed climbing 3-5 steps with a railing? : A Lot 6 Click Score: 16    End of Session   Activity Tolerance: Treatment limited secondary to agitation Patient left: in bed;with restraints reapplied;with call bell/phone within reach;with bed alarm set Nurse Communication: Mobility status PT Visit Diagnosis: Other abnormalities of gait and mobility (R26.89);Unsteadiness on feet (R26.81);History of falling (Z91.81)     Time: 6606-3016 PT Time Calculation (min) (ACUTE ONLY): 9 min  Charges:  $Therapeutic Exercise: 8-22 mins                     Lorrin Goodell, PT  Office # [phone] Pager [phone]    Lorriane Shire 07/25/2021, 10:16 AM

## 2021-07-25 NOTE — Progress Notes (Signed)
Patient tried to get out from bed, her son left her with patient's sister without restraint and sitting on her side of her bed. However, he did not make staff aware of he is leaving. Patient's sister was unaware of the situation, upon making rounds noticed sister were struggling. Family education given, pt can fall and heart herself, restraints are back on, will continue monitor her.

## 2021-07-25 NOTE — Progress Notes (Signed)
[facility] Progress Note   Subjective:  Dramatic change in mental status overnight -- confused, agitated this am. Tells me to leave her alone Hgb dropped to 6.9 ---transfusion today    Objective Vitals:   07/24/21 1945 07/24/21 2125 07/25/21 0030 07/25/21 0416  BP: (!) 120/47 (!) 173/71 (!) 148/60 139/65  Pulse: 75 (!) 109 (!) 106 (!) 108  Resp:  18 20 18   Temp: 98.1 F (36.7 C) 97.9 F (36.6 C) 98.4 F (36.9 C) 97.9 F (36.6 C)  TempSrc: Oral Oral Oral Oral  SpO2: 97% 99% 99% 98%  Weight:      Height:          Additional Objective Labs: Basic Metabolic Panel: Recent Labs  Lab 07/23/21 0623 07/24/21 0254 07/25/21 0726  NA 135 133* 131*  K 3.8 4.7 4.1  CL 101 99 96*  CO2 25 21* 22  GLUCOSE 139* 159* 208*  BUN 44* 30* 53*  CREATININE 6.64* 4.49* 6.37*  CALCIUM 8.3* 8.1* 8.2*  PHOS 2.9  --   --     CBC: Recent Labs  Lab 07/21/21 1454 07/22/21 0340 07/23/21 0623 07/24/21 0254 07/25/21 0726  WBC 10.5 6.3 7.5 14.7* 10.4  NEUTROABS 8.8*  --   --   --   --   HGB 11.3* 8.9* 9.0* 8.5* 6.9*  HCT 36.9 28.7* 29.0* 28.1* 21.0*  MCV 76.9* 76.3* 75.1* 76.6* 73.2*  PLT 234 183 214 223 250    Blood Culture    Component Value Date/Time   SDES  07/25/2020 1618    BLOOD RIGHT HAND Performed at [facility], [place] [address], [place], [place] [address]    SDES  07/25/2020 1618    BLOOD RIGHT HAND Performed at [facility], [place] [address] [place], [place] [address]    [place]  07/25/2020 1618    BOTTLES DRAWN AEROBIC ONLY Blood Culture results may not be optimal due to an inadequate volume of blood received in culture bottles Performed at [facility], [place] [address], [place], [place] [address]    SPECREQUEST  07/25/2020 1618    BOTTLES DRAWN AEROBIC ONLY Blood Culture adequate volume Performed at [facility] [place] [address], [place], [place] [address]    CULT   07/25/2020 1618    NO GROWTH 5 DAYS Performed at [facility] [place] [address], [place], [place] [address]    CULT  07/25/2020 1618    NO GROWTH 5 DAYS Performed at [facility], [place] [address], [place], [place] [address]    REPTSTATUS 07/30/2020 FINAL 07/25/2020 1618   REPTSTATUS 07/30/2020 FINAL 07/25/2020 1618     Physical Exam General: Elderly woman, confused, in hand restraints  Heart: RRR No m,r,g  Lungs: Clear bilaterally  Abdomen: soft non-tender  Extremities: No LE edema  Dialysis Access: LUE AVG +bruit   Medications:    sodium chloride   Intravenous Once   amLODipine  5 mg Oral Daily   [START ON 07/26/2021] apixaban  2.5 mg Oral BID   atorvastatin  20 mg Oral Daily   Chlorhexidine Gluconate Cloth  6 each Topical Daily   docusate sodium  100 mg Oral BID   donepezil  5 mg Oral QHS   insulin aspart  0-6 Units Subcutaneous TID WC   lanthanum  1,000 mg Oral BID WC   loratadine  10 mg Oral Daily   melatonin  3 mg Oral QHS   metoprolol  tartrate  12.5 mg Oral BID   senna  1 tablet Oral BID    Dialysis Orders:  [facility] MWF 3h 45 min 55kg 400/1.5A 2K/2.5Ca  AVG No heparin  Mircera 75 q 4ks (last 12/12) No VDRA   Assessment/Plan: Right hip fx --s/p total hip replacement 12/16. Per orthopedics.  ESRD -  HD MWF. Continue on schedule. Next HD 12/19. No heparin  Hypertension/volume  - Blood pressures controlled. No volume excess. At dry weight.  Anemia  - Hgb 8.9 >6.9. Likely post-op blood loss. For transfusion today. Recent ESA dose as outpatient. Follow trends.  Metabolic bone disease -  Ca ok. No VDRA. Continue home binders. Check phos with HD tomorrow.  Nutrition - Renal diet/fluid restriction when taking PO.  Delirium this am. Dementia at baseline   [facility] 07/25/2021,12:03 PM

## 2021-07-25 NOTE — Progress Notes (Signed)
°   07/24/21 2126  What Happened  Was fall witnessed? Yes  Who witnessed fall? Rohail Klees, RN  Patients activity before fall ambulating-unassisted  Point of contact head;buttocks  Was patient injured? No  Follow Up  MD notified Lovey Newcomer, NP  Time MD notified 2125  Family notified  (Attempted to call both of patient's daughters, with no answer. Voice message left for both daughter's, requesting them to return call)  Additional tests No

## 2021-07-25 NOTE — Progress Notes (Cosign Needed)
°  °  Durable Medical Equipment  (From admission, onward)           Start     Ordered   07/25/21 1131  For home use only DME Walker rolling  Once       Question Answer Comment  Walker: With [place] Wheels   Patient needs a walker to treat with the following condition Decreased functional mobility and endurance      07/25/21 1130   07/25/21 1131  For home use only DME 3 n 1  Once        07/25/21 1130   07/25/21 1130  For home use only DME Hospital bed  Once       Question Answer Comment  Length of Need 6 Months   Patient has (list medical condition): hip fracture; CHF   The above medical condition requires: Patient requires the ability to reposition frequently   Head must be elevated greater than: 30 degrees   Bed type Semi-electric   Support Surface: Gel Overlay      07/25/21 1130

## 2021-07-25 NOTE — Progress Notes (Signed)
ANTICOAGULATION CONSULT NOTE - Initial Consult  Pharmacy Consult for Apixaban Indication: atrial fibrillation  Allergies  Allergen Reactions   Penicillins Swelling and Rash    Patient Measurements: Height: 5' (152.4 cm) Weight: 54.3 kg (119 lb 11.4 oz) IBW/kg (Calculated) : 45.5   Vital Signs: Temp: 97.9 F (36.6 C) (12/18 0416) Temp Source: Oral (12/18 0416) BP: 139/65 (12/18 0416) Pulse Rate: 108 (12/18 0416)  Labs: Recent Labs    07/23/21 0623 07/24/21 0254 07/25/21 0726  HGB 9.0* 8.5* 6.9*  HCT 29.0* 28.1* 21.0*  PLT 214 223 250  CREATININE 6.64* 4.49*  --      Estimated Creatinine Clearance: 6.8 mL/min (A) (by C-G formula based on SCr of 4.49 mg/dL (H)).   Medical History: Past Medical History:  Diagnosis Date   Arthritis    RA   CHF (congestive heart failure) (HCC)    CKD (chronic kidney disease) stage 3, GFR 30-59 ml/min (HCC)    M-W-F dialysis   Dementia (HCC)    Diabetes mellitus without complication (HCC)    insulin dependent   Hyperlipidemia    Hypertension    Seasonal allergies    Shortness of breath dyspnea    occasional with exertion - no oxygen   Sleep apnea    does not use cpap   TIA (transient ischemic attack) 2015    Assessment: Patient presented to [facility] 12/14 with right hip pain 2/2 fall. Patient was on apixaban 2.5 BID for PAF (Scr>1.5, Age>80, Wt <60 kg). Apixaban was held at that time d/t scalp hematoma and plans for ORIF 12/16. Pharmacy was consulted to restart apixaban post-op if the Hgb >7, not requiring blood transfusion. H/H stable, PLT WNL. Apixaban restarted at home dose.  12/18: Per discussion with Dr. Aileen Fass, will hold apixaban for 24 hours d/t Hgb drop from 8.4>6.9.   Goal of Therapy:  Monitor platelets by anticoagulation protocol: Yes   Plan:  Hold apixaban 2.5 BID until 12/19  F/U restart apixaban  F/U CBC Monitor for signs and symptoms of bleeding   Adria Dill, PharmD PGY-1 Acute Care Resident   07/25/2021 8:08 AM

## 2021-07-25 NOTE — Progress Notes (Signed)
Patient's family at bedside. Her son Ples Specter took her hand restraints off and mitts as well. Upon asking him, he said she is perfectly fine and she do not needs any restraint. I explain him, how combative, angry, agitated she was and still she is angry not allowing [place] to do any ADL's and also it is patient's and staff safety. Per family, She is fine and not doing anything wrong. Strongly, advise him before he leaves room please call staff to put back restraints. He verbalized he will.

## 2021-07-25 NOTE — Progress Notes (Signed)
° °  Subjective: 2 Days Post-Op Procedure(s) (LRB): TOTAL HIP ARTHROPLASTY ANTERIOR APPROACH (Right)  Patient is resting in bed on exam this morning with mittens on. No family at the bedside currently. She has baseline dementia, but is notably more confused this morning. She does not recall [place] meeting yesterday, and does not participate in much conversation. She does comment that we are "trying to kill her."   Objective: Vital signs in last 24 hours: Temp:  [97.9 F (36.6 C)-98.5 F (36.9 C)] 97.9 F (36.6 C) (12/18 0416) Pulse Rate:  [75-109] 108 (12/18 0416) Resp:  [15-20] 18 (12/18 0416) BP: (93-173)/(36-71) 139/65 (12/18 0416) SpO2:  [97 %-99 %] 98 % (12/18 0416)  Intake/Output from previous day:  Intake/Output Summary (Last 24 hours) at 07/25/2021 0835 Last data filed at 07/24/2021 1811 Gross per 24 hour  Intake 480 ml  Output 300 ml  Net 180 ml     Intake/Output this shift: No intake/output data recorded.  Labs: Recent Labs    07/23/21 0623 07/24/21 0254 07/25/21 0726  HGB 9.0* 8.5* 6.9*   Recent Labs    07/24/21 0254 07/25/21 0726  WBC 14.7* 10.4  RBC 3.67* 2.87*  HCT 28.1* 21.0*  PLT 223 250   Recent Labs    07/24/21 0254 07/25/21 0726  NA 133* 131*  K 4.7 4.1  CL 99 96*  CO2 21* 22  BUN 30* 53*  CREATININE 4.49* 6.37*  GLUCOSE 159* 208*  CALCIUM 8.1* 8.2*   No results for input(s): LABPT, INR in the last 72 hours.  Exam: General - Patient is Alert Extremity - Dorsiflexion/Plantar flexion intact Dressing - dressing C/D/I Motor Function - intact, moving foot and toes well on exam.   Past Medical History:  Diagnosis Date   Arthritis    RA   CHF (congestive heart failure) (HCC)    CKD (chronic kidney disease) stage 3, GFR 30-59 ml/min (HCC)    M-W-F dialysis   Dementia (HCC)    Diabetes mellitus without complication (HCC)    insulin dependent   Hyperlipidemia    Hypertension    Seasonal allergies    Shortness of breath dyspnea     occasional with exertion - no oxygen   Sleep apnea    does not use cpap   TIA (transient ischemic attack) 2015    Assessment/Plan: 2 Days Post-Op Procedure(s) (LRB): TOTAL HIP ARTHROPLASTY ANTERIOR APPROACH (Right) Principal Problem:   Hip fracture (HCC)  Estimated body mass index is 23.38 kg/m as calculated from the following:   Height as of this encounter: 5' (1.524 m).   Weight as of this encounter: 54.3 kg. Advance diet Up with therapy  ABLA: Hgb 6.9 this AM, down from 8.5 yesterday - receiving 1 unit PRBC  DVT Prophylaxis -  Eliquis - currently held due to drop in Hgb to 6.9 Weight bearing as tolerated.  Patient is much more confused this morning than yesterday. Receiving 1 unit of blood. Up with PT as tolerated. Disposition per medicine team.   Griffith Citron, PA-C Orthopedic Surgery [phone] 07/25/2021, 8:35 AM

## 2021-07-25 NOTE — Progress Notes (Signed)
Multiple attempts made to get PIV but not successful. I will give report to next shift.

## 2021-07-26 ENCOUNTER — Encounter (HOSPITAL_COMMUNITY): Payer: Self-pay | Admitting: Orthopedic Surgery

## 2021-07-26 LAB — CBC
HCT: 19.3 % — ABNORMAL LOW (ref 36.0–46.0)
Hemoglobin: 6.3 g/dL — CL (ref 12.0–15.0)
MCH: 24 pg — ABNORMAL LOW (ref 26.0–34.0)
MCHC: 32.6 g/dL (ref 30.0–36.0)
MCV: 73.7 fL — ABNORMAL LOW (ref 80.0–100.0)
Platelets: 267 10*3/uL (ref 150–400)
RBC: 2.62 MIL/uL — ABNORMAL LOW (ref 3.87–5.11)
RDW: 17.6 % — ABNORMAL HIGH (ref 11.5–15.5)
WBC: 9.8 10*3/uL (ref 4.0–10.5)
nRBC: 0 % (ref 0.0–0.2)

## 2021-07-26 LAB — GLUCOSE, CAPILLARY
Glucose-Capillary: 118 mg/dL — ABNORMAL HIGH (ref 70–99)
Glucose-Capillary: 50 mg/dL — ABNORMAL LOW (ref 70–99)
Glucose-Capillary: 277 mg/dL — ABNORMAL HIGH (ref 70–99)
Glucose-Capillary: 288 mg/dL — ABNORMAL HIGH (ref 70–99)
Glucose-Capillary: 53 mg/dL — ABNORMAL LOW (ref 70–99)
Glucose-Capillary: 227 mg/dL — ABNORMAL HIGH (ref 70–99)
Glucose-Capillary: 188 mg/dL — ABNORMAL HIGH (ref 70–99)

## 2021-07-26 LAB — PREPARE RBC (CROSSMATCH)

## 2021-07-26 LAB — HEMOGLOBIN AND HEMATOCRIT, BLOOD
HCT: 27.6 % — ABNORMAL LOW (ref 36.0–46.0)
Hemoglobin: 9.3 g/dL — ABNORMAL LOW (ref 12.0–15.0)

## 2021-07-26 MED ORDER — DEXTROSE 50 % IV SOLN
INTRAVENOUS | Status: AC
  Filled 2021-07-26: qty 50

## 2021-07-26 MED ORDER — HYDROCODONE-ACETAMINOPHEN 5-325 MG PO TABS: 1.0000 | ORAL_TABLET | ORAL | 0 refills | Status: DC | PRN

## 2021-07-26 MED ORDER — DEXTROSE 50 % IV SOLN
50.0000 mL | Freq: Once | INTRAVENOUS | Status: AC
  Administered 2021-07-26: 15:00:00 50 mL via INTRAVENOUS

## 2021-07-26 MED ORDER — SODIUM CHLORIDE 0.9% IV SOLUTION: Freq: Once | INTRAVENOUS | Status: AC

## 2021-07-26 NOTE — Progress Notes (Signed)
Received report from HD staff nurse, and per [facility] she was able to removed 1.8L. and VSS.

## 2021-07-26 NOTE — Progress Notes (Signed)
°   07/26/21 1235  Vitals  Temp 97.8 F (36.6 C)  Temp Source Oral  BP 127/64  BP Location Right Arm  BP Method Automatic  Patient Position (if appropriate) Lying  Pulse Rate 84  Pulse Rate Source Monitor  Resp 17  Oxygen Therapy  SpO2 100 %  O2 Device Room Air  Pain Assessment  Pain Scale 0-10  Pain Score 0  Dialysis Weight  Weight 52.6 kg  Type of Weight Post-Dialysis  Post-Hemodialysis Assessment  Rinseback Volume (mL) 250 mL  KECN 245 V  Dialyzer Clearance Lightly streaked  Duration of HD Treatment -hour(s) 3.75 hour(s)  Hemodialysis Intake (mL) 1200 mL  UF Total -Machine (mL) 3013 mL  Net UF (mL) 1813 mL  Tolerated HD Treatment Yes  Post-Hemodialysis Comments tx complete-pt stable  AVG/AVF Arterial Site Held (minutes) 7 minutes  AVG/AVF Venous Site Held (minutes) 7 minutes  Fistula / Graft Left Upper arm Arteriovenous vein graft  Placement Date/Time: 12/10/20 0815   Placed prior to admission: No  Orientation: Left  Access Location: Upper arm  Access Type: Arteriovenous vein graft  Site Condition No complications  Fistula / Graft Assessment Present;Thrill;Bruit  Status Deaccessed   HD complete-pt stable

## 2021-07-26 NOTE — Progress Notes (Addendum)
[facility] PROGRESS NOTE    Progress Note  [name] [name]  PJK:[id number] DOB: [birth year] DOA: 07/21/2021 PCP: [name], MD     Brief Narrative:   [name] [name] is an 85 y.o. female past medical history significant for end-stage renal disease on hemodialysis,  diabetes mellitus type 2 diet controlled, essential hypertension, paroxysmal atrial fibrillation on Eliquis ambulatory dysfunction comes in with right hip pain after mechanical fall.  She dropped denies any prodromal symptoms.CT of the hip showed nondisplaced impacted subcapital right hip fracture, Eliquis has been held for washout.  Orthopedic surgery was consulted who status post ORIF.   Assessment/Plan:   Right hip fracture (HCC) secondary to mechanical fall: Orthopedic surgery was consulted who performed right total hip arthroplasty with an anterior approach. Orthopedic surgery recommended, due to her drop in her hemoglobin we will hold Eliquis for 24 hours.   Physical therapy evaluated the patient recommended home health PT. Her Hgb is lower this morning, see below for further details  End-stage renal disease on hemodialysis: Renal was consulted patient on dialysis today.  She has been tolerating it well. Tolerating her diet  Paroxysmal atrial fibrillation: Continue to hold Eliquis due to the mild drop in hemoglobin, continue oral metoprolol.  Essential hypertension: Continue amlodipine and metoprolol. Blood pressure slightly elevated but will continue amlodipine and metoprolol continue to hold clonidine and hydralazine as her blood pressure trends up can restart her back on her home regimen.  Uncontrolled diabetes mellitus type 2: With an A1c of 6.4 continue sliding scale insulin. Tolerating her diet with minimal insulin requirement.  Moderate dementia: Continue Aricept.  Chronic diastolic heart failure: Appears to be euvolemic fluid management per renal.  Anemia of chronic renal  disease/acute blood loss anemia: Likely due to surgical intervention transfused 1 unit of packed red blood cells , despite hbg remains low, transfuse an additional 2 units of packed red blood cells she has been transfused a total of 3. Recheck hemoglobin posttransfusion we will hold Eliquis.  Acute confusional state: She did not receive any narcotics or benzodiazepines, will use melatonin at night Seroquel for agitation. Seems significantly awake alert and oriented x3 this morning.  DVT prophylaxis: Eliquis Family Communication: Son at bedside Status is: Inpatient  Remains inpatient appropriate because: Acute hip fracture    Code Status:     Code Status Orders  (From admission, onward)           Start     Ordered   07/21/21 1800  Full code  Continuous        07/21/21 1802           Code Status History     Date Active Date Inactive Code Status Order ID Comments User Context   07/25/2020 0936 08/14/2020 2142 Full Code [id number]  Harold Hedge, MD ED   05/25/2015 2337 06/03/2015 1725 Full Code [id number]  Rise Patience, MD Inpatient   01/11/2014 0507 01/16/2014 1643 Full Code [id number]  Toy Baker, MD Inpatient         IV Access:   Peripheral IV   Procedures and diagnostic studies:   No results found.   Medical Consultants:   None.   Subjective:    [name] [name] Lia awake this morning relates she is hungry.  Objective:    Vitals:   07/26/21 1000 07/26/21 1023 07/26/21 1030 07/26/21 1050  BP: (!) 128/52 117/72 (!) 148/75 (!) 151/69  Pulse: 94 98 96 94  Resp: 17 18  18  Temp:  (!) 97 F (36.1 C)  (!) 97.2 F (36.2 C)  TempSrc:  Axillary  Axillary  SpO2:  100%  100%  Weight:      Height:       SpO2: 100 % O2 Flow Rate (L/min): 2 L/min   Intake/Output Summary (Last 24 hours) at 07/26/2021 1138 Last data filed at 07/26/2021 1050 Gross per 24 hour  Intake 366.25 ml  Output 2 ml  Net 364.25 ml    Filed Weights    07/23/21 0819 07/23/21 1150 07/26/21 0758  Weight: 55.3 kg 54.3 kg 54.5 kg    Exam: General exam: In no acute distress. Respiratory system: Good air movement and clear to auscultation. Cardiovascular system: S1 & S2 heard, RRR. No JVD. Gastrointestinal system: Abdomen is nondistended, soft and nontender.  Extremities: No pedal edema. Skin: No rashes, lesions or ulcers Psychiatry: Judgement and insight appear normal. Mood & affect appropriate.   Data Reviewed:    Labs: Basic Metabolic Panel: Recent Labs  Lab 07/21/21 1454 07/22/21 0340 07/23/21 0623 07/24/21 0254 07/25/21 0726  NA 135 136 135 133* 131*  K 3.8 3.3* 3.8 4.7 4.1  CL 97* 103 101 99 96*  CO2 27 27 25  21* 22  GLUCOSE 168* 128* 139* 159* 208*  BUN 30* 33* 44* 30* 53*  CREATININE 4.98* 5.42* 6.64* 4.49* 6.37*  CALCIUM 9.0 8.3* 8.3* 8.1* 8.2*  PHOS  --   --  2.9  --   --     GFR Estimated Creatinine Clearance: 4.8 mL/min (A) (by C-G formula based on SCr of 6.37 mg/dL (H)). Liver Function Tests: Recent Labs  Lab 07/23/21 0623  ALBUMIN 2.5*    No results for input(s): LIPASE, AMYLASE in the last 168 hours. No results for input(s): AMMONIA in the last 168 hours. Coagulation profile No results for input(s): INR, PROTIME in the last 168 hours. COVID-19 Labs  No results for input(s): DDIMER, FERRITIN, LDH, CRP in the last 72 hours.  Lab Results  Component Value Date   SARSCOV2NAA NEGATIVE 07/21/2021   SARSCOV2NAA NEGATIVE 12/08/2020   SARSCOV2NAA POSITIVE (A) 07/25/2020    CBC: Recent Labs  Lab 07/21/21 1454 07/22/21 0340 07/23/21 0623 07/24/21 0254 07/25/21 0726 07/26/21 0326  WBC 10.5 6.3 7.5 14.7* 10.4 9.8  NEUTROABS 8.8*  --   --   --   --   --   HGB 11.3* 8.9* 9.0* 8.5* 6.9* 6.3*  HCT 36.9 28.7* 29.0* 28.1* 21.0* 19.3*  MCV 76.9* 76.3* 75.1* 76.6* 73.2* 73.7*  PLT 234 183 214 223 250 267    Cardiac Enzymes: No results for input(s): CKTOTAL, CKMB, CKMBINDEX, TROPONINI in the last 168  hours. BNP (last 3 results) No results for input(s): PROBNP in the last 8760 hours. CBG: Recent Labs  Lab 07/24/21 1607 07/24/21 2126 07/25/21 1225 07/25/21 1945 07/26/21 0629  GLUCAP 190* 134* 151* 172* 118*    D-Dimer: No results for input(s): DDIMER in the last 72 hours. Hgb A1c: No results for input(s): HGBA1C in the last 72 hours.  Lipid Profile: No results for input(s): CHOL, HDL, LDLCALC, TRIG, CHOLHDL, LDLDIRECT in the last 72 hours. Thyroid function studies: No results for input(s): TSH, T4TOTAL, T3FREE, THYROIDAB in the last 72 hours.  Invalid input(s): FREET3 Anemia work up: No results for input(s): VITAMINB12, FOLATE, FERRITIN, TIBC, IRON, RETICCTPCT in the last 72 hours. Sepsis Labs: Recent Labs  Lab 07/23/21 0623 07/24/21 0254 07/25/21 0726 07/26/21 0326  WBC 7.5 14.7* 10.4 9.8  Microbiology Recent Results (from the past 240 hour(s))  Resp Panel by RT-PCR (Flu A&B, Covid) Nasopharyngeal Swab     Status: None   Collection Time: 07/21/21  3:21 PM   Specimen: Nasopharyngeal Swab; Nasopharyngeal(NP) swabs in vial transport medium  Result Value Ref Range Status   SARS Coronavirus 2 by RT PCR NEGATIVE NEGATIVE Final    Comment: (NOTE) SARS-CoV-2 target nucleic acids are NOT DETECTED.  The SARS-CoV-2 RNA is generally detectable in upper respiratory specimens during the acute phase of infection. The lowest concentration of SARS-CoV-2 viral copies this assay can detect is 138 copies/mL. A negative result does not preclude SARS-Cov-2 infection and should not be used as the sole basis for treatment or other patient management decisions. A negative result may occur with  improper specimen collection/handling, submission of specimen other than nasopharyngeal swab, presence of viral mutation(s) within the areas targeted by this assay, and inadequate number of viral copies(<138 copies/mL). A negative result must be combined with clinical observations, patient  history, and epidemiological information. The expected result is Negative.  Fact Sheet for Patients:  EntrepreneurPulse.com.au  Fact Sheet for Healthcare Providers:  IncredibleEmployment.be  This test is no t yet approved or cleared by the [place] FDA and  has been authorized for detection and/or diagnosis of SARS-CoV-2 by FDA under an Emergency Use Authorization (EUA). This EUA will remain  in effect (meaning this test can be used) for the duration of the COVID-19 declaration under Section 564(b)(1) of the Act, 21 U.S.C.section 360bbb-3(b)(1), unless the authorization is terminated  or revoked sooner.       Influenza A by PCR NEGATIVE NEGATIVE Final   Influenza B by PCR NEGATIVE NEGATIVE Final    Comment: (NOTE) The Xpert Xpress SARS-CoV-2/FLU/RSV plus assay is intended as an aid in the diagnosis of influenza from Nasopharyngeal swab specimens and should not be used as a sole basis for treatment. Nasal washings and aspirates are unacceptable for Xpert Xpress SARS-CoV-2/FLU/RSV testing.  Fact Sheet for Patients: EntrepreneurPulse.com.au  Fact Sheet for Healthcare Providers: IncredibleEmployment.be  This test is not yet approved or cleared by the [place] FDA and has been authorized for detection and/or diagnosis of SARS-CoV-2 by FDA under an Emergency Use Authorization (EUA). This EUA will remain in effect (meaning this test can be used) for the duration of the COVID-19 declaration under Section 564(b)(1) of the Act, 21 U.S.C. section 360bbb-3(b)(1), unless the authorization is terminated or revoked.  Performed at [facility], [place] [address] [place], [place] [address]   Surgical pcr screen     Status: Abnormal   Collection Time: 07/22/21 11:53 PM   Specimen: Nasal Mucosa; Nasal Swab  Result Value Ref Range Status   MRSA, PCR NEGATIVE NEGATIVE Final   Staphylococcus aureus  POSITIVE (A) NEGATIVE Final    Comment: (NOTE) The Xpert SA Assay (FDA approved for NASAL specimens in patients 95 years of age and older), is one component of a comprehensive surveillance program. It is not intended to diagnose infection nor to guide or monitor treatment. Performed at [facility], [place] [address], [place], [place] [address]      Medications:    sodium chloride   Intravenous Once   amLODipine  5 mg Oral Daily   atorvastatin  20 mg Oral Daily   Chlorhexidine Gluconate Cloth  6 each Topical Daily   docusate sodium  100 mg Oral BID   donepezil  5 mg Oral QHS   insulin aspart  0-6  Units Subcutaneous TID WC   lanthanum  1,000 mg Oral BID WC   loratadine  10 mg Oral Daily   melatonin  3 mg Oral QHS   metoprolol tartrate  12.5 mg Oral BID   senna  1 tablet Oral BID   Continuous Infusions:     LOS: 5 days   Charlynne Cousins  [facility]  07/26/2021, 11:38 AM

## 2021-07-26 NOTE — Progress Notes (Signed)
PT Cancellation Note  Patient Details Name: [name] [name] MRN: [id number] DOB: 11-Mar-1936   Cancelled Treatment:    Reason Eval/Treat Not Completed: Medical issues which prohibited therapy  Attempted to see pt after she returned form HD; Currently with low blood sugar;   Will hold PT at this time, and follow up tomorrow;   Roney Marion, [place] Pager [phone] Office [phone]    [name] 07/26/2021, 1:44 PM

## 2021-07-26 NOTE — Progress Notes (Signed)
This Probation officer accompanied pt with transporter to HD, Pt remains alert/confused in no apparent distress., 1 unit prbc still transfusing with no adverse rxns noted. HD stafff nurse aware.

## 2021-07-26 NOTE — Progress Notes (Signed)
Hypoglycemic Event  CBG50  Treatment: 30 oz apple juice, lunch provided  Symptoms: none,  Follow-up CBG: Time:1434 CBG Result:53, 277  Possible Reasons for Event: inadequate meal intake  Comments/MD notified:attending MD made aware and d50 given as ordered. Rechecked cbg 277    Mellody Dance, Suzi Hernan

## 2021-07-26 NOTE — Procedures (Signed)
I was present at this dialysis session. I have reviewed the session itself and made appropriate changes.  She is much more awake and alert this morning. Oriented to person and place.   Vital signs in last 24 hours:  Temp:  [97.9 F (36.6 C)-98.7 F (37.1 C)] 98.6 F (37 C) (12/19 0758) Pulse Rate:  [78-98] 95 (12/19 0830) Resp:  [14-18] 17 (12/19 0758) BP: (98-156)/(43-77) 139/68 (12/19 0830) SpO2:  [98 %-100 %] 100 % (12/19 0758) Weight:  [54.5 kg] 54.5 kg (12/19 0758) Weight change:  Filed Weights   07/23/21 0819 07/23/21 1150 07/26/21 0758  Weight: 55.3 kg 54.3 kg 54.5 kg    Recent Labs  Lab 07/23/21 0623 07/24/21 0254 07/25/21 0726  NA 135   < > 131*  K 3.8   < > 4.1  CL 101   < > 96*  CO2 25   < > 22  GLUCOSE 139*   < > 208*  BUN 44*   < > 53*  CREATININE 6.64*   < > 6.37*  CALCIUM 8.3*   < > 8.2*  PHOS 2.9  --   --    < > = values in this interval not displayed.    Recent Labs  Lab 07/21/21 1454 07/22/21 0340 07/24/21 0254 07/25/21 0726 07/26/21 0326  WBC 10.5   < > 14.7* 10.4 9.8  NEUTROABS 8.8*  --   --   --   --   HGB 11.3*   < > 8.5* 6.9* 6.3*  HCT 36.9   < > 28.1* 21.0* 19.3*  MCV 76.9*   < > 76.6* 73.2* 73.7*  PLT 234   < > 223 250 267   < > = values in this interval not displayed.    Scheduled Meds:  sodium chloride   Intravenous Once   sodium chloride   Intravenous Once   amLODipine  5 mg Oral Daily   atorvastatin  20 mg Oral Daily   Chlorhexidine Gluconate Cloth  6 each Topical Daily   docusate sodium  100 mg Oral BID   donepezil  5 mg Oral QHS   insulin aspart  0-6 Units Subcutaneous TID WC   lanthanum  1,000 mg Oral BID WC   loratadine  10 mg Oral Daily   melatonin  3 mg Oral QHS   metoprolol tartrate  12.5 mg Oral BID   senna  1 tablet Oral BID   Continuous Infusions: PRN Meds:.acetaminophen, bisacodyl, HYDROcodone-acetaminophen, HYDROcodone-acetaminophen, menthol-cetylpyridinium **OR** phenol, metoCLOPramide **OR** metoCLOPramide  (REGLAN) injection, morphine injection, ondansetron **OR** ondansetron (ZOFRAN) IV, QUEtiapine   Donetta Potts,  MD 07/26/2021, 8:49 AM

## 2021-07-26 NOTE — Progress Notes (Signed)
Pt is finishing up her 1/2 PRBC for her morning HGB of 6.3. Pt is about to go to dialysis, therefore I have given the RN report and she stated that she would take over finishing the 1/2 PRBC. After she will  give the second one in dialysis. Pt morning blood glucose was 118. Will continue to monitor.

## 2021-07-27 DIAGNOSIS — D62 Acute posthemorrhagic anemia: Secondary | ICD-10-CM

## 2021-07-27 LAB — TYPE AND SCREEN
ABO/RH(D): B POS
Antibody Screen: NEGATIVE
Unit division: 0
Unit division: 0

## 2021-07-27 LAB — BPAM RBC
Blood Product Expiration Date: 202301062359
Blood Product Expiration Date: 202301092359
ISSUE DATE / TIME: 202212190450
ISSUE DATE / TIME: 202212191000
Unit Type and Rh: 7300
Unit Type and Rh: 7300

## 2021-07-27 LAB — GLUCOSE, CAPILLARY
Glucose-Capillary: 145 mg/dL — ABNORMAL HIGH (ref 70–99)
Glucose-Capillary: 235 mg/dL — ABNORMAL HIGH (ref 70–99)
Glucose-Capillary: 153 mg/dL — ABNORMAL HIGH (ref 70–99)
Glucose-Capillary: 165 mg/dL — ABNORMAL HIGH (ref 70–99)
Glucose-Capillary: 182 mg/dL — ABNORMAL HIGH (ref 70–99)

## 2021-07-27 MED ORDER — AMLODIPINE BESYLATE 10 MG PO TABS
10.0000 mg | ORAL_TABLET | Freq: Every day | ORAL | Status: DC
  Administered 2021-07-27 – 2021-07-31 (×3): 10 mg via ORAL
  Filled 2021-07-27 (×4): qty 1

## 2021-07-27 MED ORDER — CHLORHEXIDINE GLUCONATE CLOTH 2 % EX PADS: 6.0000 | MEDICATED_PAD | Freq: Every day | CUTANEOUS | Status: DC

## 2021-07-27 MED ORDER — PENTAFLUOROPROP-TETRAFLUOROETH EX AERO: 1.0000 "application " | INHALATION_SPRAY | CUTANEOUS | Status: DC | PRN

## 2021-07-27 MED ORDER — SODIUM CHLORIDE 0.9 % IV SOLN: 100.0000 mL | INTRAVENOUS | Status: DC | PRN

## 2021-07-27 MED ORDER — HEPARIN SODIUM (PORCINE) 1000 UNIT/ML DIALYSIS: 1000.0000 [IU] | INTRAMUSCULAR | Status: DC | PRN

## 2021-07-27 MED ORDER — MUPIROCIN 2 % EX OINT
1.0000 "application " | TOPICAL_OINTMENT | Freq: Two times a day (BID) | CUTANEOUS | Status: DC
  Administered 2021-07-27 – 2021-07-31 (×8): 1 via NASAL
  Filled 2021-07-27 (×2): qty 22

## 2021-07-27 MED ORDER — ALTEPLASE 2 MG IJ SOLR: 2.0000 mg | Freq: Once | INTRAMUSCULAR | Status: DC | PRN

## 2021-07-27 MED ORDER — LIDOCAINE HCL (PF) 1 % IJ SOLN: 5.0000 mL | INTRAMUSCULAR | Status: DC | PRN

## 2021-07-27 NOTE — Progress Notes (Addendum)
[name] [name] Progress Note   Subjective:   Seen and examined patient at bedside. S/p total 3 units PRBCs yesterday for Hgb 6.3-Hgb now 9.3. No complaints or concerns. Tolerated yesterday's HD with net UF 1.8L. Denies SOB, CP, and N/V. Plan for HD 07/28/21.   Objective Vitals:   07/26/21 1500 07/26/21 1700 07/26/21 2040 07/27/21 0505  BP: (!) 121/50 (!) 132/51 (!) 129/53 (!) 162/67  Pulse: 92 94 91 100  Resp: 18 17 18 16   Temp: 98 F (36.7 C)  98.2 F (36.8 C) 97.6 F (36.4 C)  TempSrc: Oral   Oral  SpO2: 98% 100% 98% 98%  Weight:      Height:       Physical Exam General: Weak-appearing; Alert; NAD Heart: Noted 2/6 systolic murmur; S1 and S2; No gallops or rubs Lungs: Clear anteriorly and laterally; No wheezing, rales, or rhonchi Abdomen: Soft and non-tender Extremities:  No edema BLLE Dialysis Access:  LUE AVG (+) Bruit/Thrill  Filed Weights   07/23/21 1150 07/26/21 0758 07/26/21 1235  Weight: 54.3 kg 54.5 kg 52.6 kg    Intake/Output Summary (Last 24 hours) at 07/27/2021 1113 Last data filed at 07/27/2021 1040 Gross per 24 hour  Intake 600 ml  Output 1813 ml  Net -1213 ml    Additional Objective Labs: Basic Metabolic Panel: Recent Labs  Lab 07/23/21 0623 07/24/21 0254 07/25/21 0726  NA 135 133* 131*  K 3.8 4.7 4.1  CL 101 99 96*  CO2 25 21* 22  GLUCOSE 139* 159* 208*  BUN 44* 30* 53*  CREATININE 6.64* 4.49* 6.37*  CALCIUM 8.3* 8.1* 8.2*  PHOS 2.9  --   --    Liver Function Tests: Recent Labs  Lab 07/23/21 0623  ALBUMIN 2.5*   No results for input(s): LIPASE, AMYLASE in the last 168 hours. CBC: Recent Labs  Lab 07/21/21 1454 07/22/21 0340 07/23/21 0623 07/24/21 0254 07/25/21 0726 07/26/21 0326 07/26/21 1905  WBC 10.5 6.3 7.5 14.7* 10.4 9.8  --   NEUTROABS 8.8*  --   --   --   --   --   --   HGB 11.3* 8.9* 9.0* 8.5* 6.9* 6.3* 9.3*  HCT 36.9 28.7* 29.0* 28.1* 21.0* 19.3* 27.6*  MCV 76.9* 76.3* 75.1* 76.6* 73.2* 73.7*  --    PLT 234 183 214 223 250 267  --    Blood Culture    Component Value Date/Time   SDES  07/25/2020 1618    BLOOD RIGHT HAND Performed at [facility], [place] [address] [place], [name] [address]    SDES  07/25/2020 1618    BLOOD RIGHT HAND Performed at [facility], [place] [address] [place], [place] [address]    [place]  07/25/2020 1618    BOTTLES DRAWN AEROBIC ONLY Blood Culture results may not be optimal due to an inadequate volume of blood received in culture bottles Performed at [facility], [place] [address], [place], [place] [address]    SPECREQUEST  07/25/2020 1618    BOTTLES DRAWN AEROBIC ONLY Blood Culture adequate volume Performed at [place] [address] [place], [place] [address]    CULT  07/25/2020 1618    NO GROWTH 5 DAYS Performed at [place] [address], [place], [place] [address]    CULT  07/25/2020 1618    NO GROWTH 5 DAYS Performed at [facility], [place] [address], [place], [place] [address]  REPTSTATUS 07/30/2020 FINAL 07/25/2020 1618   REPTSTATUS 07/30/2020 FINAL 07/25/2020 1618    Cardiac Enzymes: No results for input(s): CKTOTAL, CKMB, CKMBINDEX, TROPONINI in the last 168 hours. CBG: Recent Labs  Lab 07/26/21 1524 07/26/21 1528 07/26/21 1708 07/26/21 2039 07/27/21 0637  GLUCAP 288* 277* 227* 188* 145*   Iron Studies: No results for input(s): IRON, TIBC, TRANSFERRIN, FERRITIN in the last 72 hours. Lab Results  Component Value Date   INR 1.2 07/25/2020   INR 1.08 01/17/2014   Studies/Results: No results found.  Medications:   sodium chloride   Intravenous Once   amLODipine  10 mg Oral Daily   atorvastatin  20 mg Oral Daily   Chlorhexidine Gluconate Cloth  6 each Topical Daily   docusate sodium  100 mg Oral BID   donepezil  5 mg Oral QHS   insulin aspart  0-6 Units Subcutaneous TID WC   lanthanum  1,000 mg Oral BID WC   loratadine   10 mg Oral Daily   melatonin  3 mg Oral QHS   metoprolol tartrate  12.5 mg Oral BID   senna  1 tablet Oral BID    Dialysis Orders: [facility] M[facility] 3h 45 min 55kg 400/1.5A 2K/2.5Ca  AVG No heparin  Mircera 75 q 4ks (last 12/12) No VDRA   Assessment/Plan: Right hip fx --s/p total hip replacement 12/16. Per orthopedics.  ESRD -  HD MWF. Continue on schedule. Next HD 07/28/21. No heparin  Hypertension/volume  - Blood pressures controlled. No volume excess. At dry weight.  Anemia  - S/p total 3 units PRBCs for Hgb 6.3. Likely post-op blood loss. Hgb now 9.3 this AM. Recent ESA dose as outpatient. Follow trends.  Metabolic bone disease -  Ca ok. No VDRA. Continue home binders. Check phos with HD tomorrow.  Nutrition - Renal diet/fluid restriction when taking PO.  Delirium this am. Dementia at [place], NP [facility] [name] 07/27/2021,11:13 AM  LOS: 6 days    I have seen and examined this patient and agree with plan and assessment in the above note with renal recommendations/intervention highlighted.  Daughter is at the bedside and reports that she plans to take [name] [name] home for [facility] and rehab but lives in [place].  Her HD unit is [place] so she is asking to be discharged on 07/30/21 so they can arrange for the transportation to [facility].  [name] 07/27/2021 1:54 PM

## 2021-07-27 NOTE — Progress Notes (Signed)
Physical Therapy Treatment Patient Details Name: [name] [name] MRN: [id number] DOB: 25-Aug-1935 Today's Date: 07/27/2021   History of Present Illness Pt. is 85 yr old F admitted on 07/21/21 s/p fall resulting in R LE pain with inability to bear weight.  Imaging (+) for R subcapital femoral neck fx. Pt. underwent direct ant THA on 12/16. PMH: arthritis, CHF, CKD, dementia, DM, HTN, sleep apnea, TIA, ESRD on HD    PT Comments    Pt demos improved cognition this tx session and is able to amb around room with min A for safety.  Pt demos improved balance compared to prior session, but continues to require max Vcs initially for appropriate gait sequencing.  Pt is returned BTB at end of tx session for safety secondary to not having family/sitter present in room with NSG reports of confusion, combativeness and a fall over the past couple days.  PT to continue to work towards progressing gait distance and safety with RW negotiation.  Recommendations for follow up therapy are one component of a multi-disciplinary discharge planning process, led by the attending physician.  Recommendations may be updated based on patient status, additional functional criteria and insurance authorization.  Follow Up Recommendations  Home health PT     Assistance Recommended at Discharge    Equipment Recommendations  Rolling walker (2 wheels);BSC/3in1    Recommendations for Other Services       Precautions / Restrictions Restrictions Weight Bearing Restrictions: Yes RLE Weight Bearing: Weight bearing as tolerated     Mobility  Bed Mobility Overal bed mobility: Needs Assistance Bed Mobility: Supine to Sit;Sit to Supine     Supine to sit: Min assist Sit to supine: Min assist   General bed mobility comments: Pt. requires min A for R LE negotiation and HH support to transfer in and OOB.  Pt. scoots up in bed with min A and use of chuck pad. Patient Response: Cooperative  Transfers Overall transfer  level: Needs assistance Equipment used: Rolling walker (2 wheels) Transfers: Sit to/from Stand Sit to Stand: Min assist           General transfer comment: Min A with sit > stand for safety with hand placement.  Demos good standing balance with use of AD.    Ambulation/Gait Ambulation/Gait assistance: Min assist Gait Distance (Feet): 20 Feet Assistive device: Rolling walker (2 wheels) Gait Pattern/deviations: Step-to pattern       General Gait Details: Amb around room with min A primarily for RW negotiation.  Pt. needs max VC for first 10 ft to remember appropriate gait sequencing, but progress to amb without VCs and only intermittent assist to negotiate RW.  Pt. is seated EOB to complete therex at end of gait training.   Stairs             Wheelchair Mobility    Modified Rankin (Stroke Patients Only)       Balance Overall balance assessment: Needs assistance Sitting-balance support: Bilateral upper extremity supported;Feet supported Sitting balance-Leahy Scale: Fair     Standing balance support: Bilateral upper extremity supported;During functional activity Standing balance-Leahy Scale: Fair                              Cognition Arousal/Alertness: Awake/alert Behavior During Therapy: WFL for tasks assessed/performed Overall Cognitive Status: Within Functional Limits for tasks assessed  General Comments: Pt. is supine in bed when PT arrives.  Alert and agreeable to therapy.  Seems to be back to baseline, no confusion at this time however notes and chart indicate pt was combative and confused overnight.        Exercises Total Joint Exercises Ankle Circles/Pumps: AROM;Seated;Both;10 reps Cretella Arc Quad: AAROM;Right;10 reps Marching in Standing: AAROM;Seated;Right;10 reps    General Comments        Pertinent Vitals/Pain Pain Assessment: 0-10 Pain Score: 0-No pain    Home Living                           Prior Function            PT Goals (current goals can now be found in the care plan section) Progress towards PT goals: Progressing toward goals    Frequency           PT Plan Current plan remains appropriate    Co-evaluation              AM-PAC PT "6 Clicks" Mobility   Outcome Measure  Help needed turning from your back to your side while in a flat bed without using bedrails?: A Little Help needed moving from lying on your back to sitting on the side of a flat bed without using bedrails?: A Little Help needed moving to and from a bed to a chair (including a wheelchair)?: A Little Help needed standing up from a chair using your arms (e.g., wheelchair or bedside chair)?: A Little Help needed to walk in hospital room?: A Little Help needed climbing 3-5 steps with a railing? : A Lot 6 Click Score: 17    End of Session Equipment Utilized During Treatment: Gait belt Activity Tolerance: Patient tolerated treatment well;No increased pain Patient left: in bed;with call bell/phone within reach;with bed alarm set         Time: 4136-4383 PT Time Calculation (min) (ACUTE ONLY): 25 min  Charges:  $Gait Training: 8-22 mins $Therapeutic Exercise: 8-22 mins                     Jenefer Woerner A. Elyssia Strausser, PT, DPT Acute Rehabilitation Services Office: [place] 07/27/2021, 10:21 AM

## 2021-07-27 NOTE — Progress Notes (Signed)
Occupational Therapy Treatment Patient Details Name: [name] [name] MRN: [id number] DOB: 03-Jul-1936 Today's Date: 07/27/2021   History of present illness Pt. is 85 yr old F admitted on 07/21/21 s/p fall resulting in R LE pain with inability to bear weight.  Imaging (+) for R subcapital femoral neck fx. Pt. underwent direct ant THA on 12/16. PMH: arthritis, CHF, CKD, dementia, DM, HTN, sleep apnea, TIA, ESRD on HD   OT comments  Pt is slowly progressing towards OT goals. During session, pt completed LB dressing with and standing grooming with Min guard. Pt also simulated toilet transfer with Min A and required Min A for functional mobility. Continues to require frequent cues for sequencing and safety. Will continue to follow.   Recommendations for follow up therapy are one component of a multi-disciplinary discharge planning process, led by the attending physician.  Recommendations may be updated based on patient status, additional functional criteria and insurance authorization.    Follow Up Recommendations  No OT follow up    Assistance Recommended at Discharge Intermittent Supervision/Assistance  Equipment Recommendations  None recommended by OT    Recommendations for Other Services      Precautions / Restrictions Precautions Precautions: Fall Restrictions Weight Bearing Restrictions: Yes RLE Weight Bearing: Weight bearing as tolerated       Mobility Bed Mobility Overal bed mobility: Needs Assistance Bed Mobility: Supine to Sit;Sit to Supine     Supine to sit: Min assist Sit to supine: Min assist        Transfers Overall transfer level: Needs assistance Equipment used: Rolling walker (2 wheels) Transfers: Sit to/from Stand Sit to Stand: Min assist                 Balance Overall balance assessment: Needs assistance Sitting-balance support: Bilateral upper extremity supported;Feet supported Sitting balance-Leahy Scale: Fair     Standing balance  support: Bilateral upper extremity supported;During functional activity;No upper extremity supported Standing balance-Leahy Scale: Fair                             ADL either performed or assessed with clinical judgement   ADL Overall ADL's : Needs assistance/impaired     Grooming: Min guard;Standing;Wash/dry hands;Wash/dry face;Oral care;Brushing hair Grooming Details (indicate cue type and reason): At sink             Lower Body Dressing: Min guard Lower Body Dressing Details (indicate cue type and reason): Able to cross legs to don/doff socks at EOB. Toilet Transfer: Minimal assistance;Cueing for safety;Ambulation;Regular Toilet;Rolling walker (2 wheels)           Functional mobility during ADLs: Minimal assistance;Cueing for safety;Rolling walker (2 wheels)      Extremity/Trunk Assessment              Vision       Perception     Praxis      Cognition Arousal/Alertness: Awake/alert Behavior During Therapy: WFL for tasks assessed/performed Overall Cognitive Status: Within Functional Limits for tasks assessed                                            Exercises     Shoulder Instructions       General Comments      Pertinent Vitals/ Pain       Pain Assessment: No/denies pain  Home Living                                          Prior Functioning/Environment              Frequency  Min 2X/week        Progress Toward Goals  OT Goals(current goals can now be found in the care plan section)  Progress towards OT goals: Progressing toward goals  Acute Rehab OT Goals Patient Stated Goal: none stated OT Goal Formulation: With patient Time For Goal Achievement: 08/07/21 Potential to Achieve Goals: Good ADL Goals Pt Will Perform Grooming: with modified independence;standing Pt Will Perform Lower Body Bathing: with supervision;sit to/from stand Pt Will Perform Lower Body Dressing: with  supervision;sit to/from stand Pt Will Transfer to Toilet: with modified independence;ambulating  Plan Discharge plan remains appropriate;Frequency remains appropriate    Co-evaluation                 AM-PAC OT "6 Clicks" Daily Activity     Outcome Measure   Help from another person eating meals?: None Help from another person taking care of personal grooming?: A Little Help from another person toileting, which includes using toliet, bedpan, or urinal?: A Little Help from another person bathing (including washing, rinsing, drying)?: A Little Help from another person to put on and taking off regular upper body clothing?: A Little Help from another person to put on and taking off regular lower body clothing?: A Little 6 Click Score: 19    End of Session Equipment Utilized During Treatment: Rolling walker (2 wheels);Gait belt  OT Visit Diagnosis: Unsteadiness on feet (R26.81);Muscle weakness (generalized) (M62.81);Other symptoms and signs involving cognitive function   Activity Tolerance Patient tolerated treatment well   Patient Left in bed;with call bell/phone within reach;with bed alarm set   Nurse Communication Mobility status        Time: 0017-4944 OT Time Calculation (min): 17 min  Charges: OT General Charges $OT Visit: 1 Visit OT Treatments $Self Care/Home Management : 8-22 mins  Sherrel Shafer C, OT/L  Acute Rehab [place] 07/27/2021, 2:30 PM

## 2021-07-27 NOTE — Care Management Important Message (Signed)
Important Message  Patient Details  Name: [name] [name] MRN: [id number] Date of Birth: March 10, 1936   Medicare Important Message Given:  Yes     Hannah Beat 07/27/2021, 1:34 PM

## 2021-07-27 NOTE — Progress Notes (Signed)
[facility] PROGRESS NOTE    Progress Note  Breanna Shorkey Behrens  BZJ:[id number] DOB: 1936-07-20 DOA: 07/21/2021 PCP: [name], MD     Brief Narrative:   Rick Warnick Letts is an 85 y.o. female past medical history significant for end-stage renal disease on hemodialysis,  diabetes mellitus type 2 diet controlled, essential hypertension, paroxysmal atrial fibrillation on Eliquis ambulatory dysfunction comes in with right hip pain after mechanical fall.  She dropped denies any prodromal symptoms.CT of the hip showed nondisplaced impacted subcapital right hip fracture, Eliquis has been held for washout.  Orthopedic surgery was consulted who status post ORIF.   Assessment/Plan:   Right hip fracture (HCC) secondary to mechanical fall: Orthopedic surgery was consulted who performed right total hip arthroplasty with an anterior approach. She status post 2 units of packed red blood cells her hemoglobin this morning is 9.3. Physical therapy evaluated the patient recommended home health PT.  End-stage renal disease on hemodialysis: Renal was consulted patient on dialysis today.  She has been tolerating it well. Tolerating her diet  Paroxysmal atrial fibrillation: Continue to hold Eliquis due to the mild drop in hemoglobin, continue oral metoprolol.  Essential hypertension: Continue amlodipine and metoprolol. Blood pressure slightly elevated continue amlodipine, metoprolol, will start clonidine.    Uncontrolled diabetes mellitus type 2: With an A1c of 6.4 continue sliding scale insulin. Tolerating her diet with minimal insulin requirement.  Moderate dementia: Continue Aricept.  Chronic diastolic heart failure: Appears to be euvolemic fluid management per renal.  Anemia of chronic renal disease/acute blood loss anemia: Likely due to surgical intervention her Eliquis was held she is status post 3 units of packed red blood cells her hemoglobin this morning is  9.3. Resume Eliquis tomorrow and recheck a CBC tomorrow  Acute confusional state: Alert and oriented this morning,confusional state has resolved  DVT prophylaxis: Eliquis Family Communication: Son at bedside Status is: Inpatient  Remains inpatient appropriate because: Acute hip fracture    Code Status:     Code Status Orders  (From admission, onward)           Start     Ordered   07/21/21 1800  Full code  Continuous        07/21/21 1802           Code Status History     Date Active Date Inactive Code Status Order ID Comments User Context   07/25/2020 0936 08/14/2020 2142 Full Code [id number]  Harold Hedge, MD ED   05/25/2015 2337 06/03/2015 1725 Full Code [id number]  Rise Patience, MD Inpatient   01/11/2014 0507 01/16/2014 1643 Full Code [id number]  Toy Baker, MD Inpatient         IV Access:   Peripheral IV   Procedures and diagnostic studies:   No results found.   Medical Consultants:   None.   Subjective:    Almira Coaster Danley awake this morning able to carry on a conversation, she relates she is hungry  Objective:    Vitals:   07/26/21 1500 07/26/21 1700 07/26/21 2040 07/27/21 0505  BP: (!) 121/50 (!) 132/51 (!) 129/53 (!) 162/67  Pulse: 92 94 91 100  Resp: 18 17 18 16   Temp: 98 F (36.7 C)  98.2 F (36.8 C) 97.6 F (36.4 C)  TempSrc: Oral   Oral  SpO2: 98% 100% 98% 98%  Weight:      Height:       SpO2: 98 % O2 Flow Rate (L/min): 2 L/min  Intake/Output Summary (Last 24 hours) at 07/27/2021 0832 Last data filed at 07/26/2021 1500 Gross per 24 hour  Intake 567.5 ml  Output 1813 ml  Net -1245.5 ml    Filed Weights   07/23/21 1150 07/26/21 0758 07/26/21 1235  Weight: 54.3 kg 54.5 kg 52.6 kg    Exam: General exam: In no acute distress. Respiratory system: Good air movement and clear to auscultation. Cardiovascular system: S1 & S2 heard, RRR. No JVD. Gastrointestinal system: Abdomen is nondistended,  soft and nontender.  Skin: No rashes, lesions or ulcers Psychiatry: Judgement and insight appear normal. Mood & affect appropriate.   Data Reviewed:    Labs: Basic Metabolic Panel: Recent Labs  Lab 07/21/21 1454 07/22/21 0340 07/23/21 0623 07/24/21 0254 07/25/21 0726  NA 135 136 135 133* 131*  K 3.8 3.3* 3.8 4.7 4.1  CL 97* 103 101 99 96*  CO2 27 27 25  21* 22  GLUCOSE 168* 128* 139* 159* 208*  BUN 30* 33* 44* 30* 53*  CREATININE 4.98* 5.42* 6.64* 4.49* 6.37*  CALCIUM 9.0 8.3* 8.3* 8.1* 8.2*  PHOS  --   --  2.9  --   --     GFR Estimated Creatinine Clearance: 4.8 mL/min (A) (by C-G formula based on SCr of 6.37 mg/dL (H)). Liver Function Tests: Recent Labs  Lab 07/23/21 0623  ALBUMIN 2.5*    No results for input(s): LIPASE, AMYLASE in the last 168 hours. No results for input(s): AMMONIA in the last 168 hours. Coagulation profile No results for input(s): INR, PROTIME in the last 168 hours. COVID-19 Labs  No results for input(s): DDIMER, FERRITIN, LDH, CRP in the last 72 hours.  Lab Results  Component Value Date   SARSCOV2NAA NEGATIVE 07/21/2021   SARSCOV2NAA NEGATIVE 12/08/2020   SARSCOV2NAA POSITIVE (A) 07/25/2020    CBC: Recent Labs  Lab 07/21/21 1454 07/22/21 0340 07/23/21 0623 07/24/21 0254 07/25/21 0726 07/26/21 0326 07/26/21 1905  WBC 10.5 6.3 7.5 14.7* 10.4 9.8  --   NEUTROABS 8.8*  --   --   --   --   --   --   HGB 11.3* 8.9* 9.0* 8.5* 6.9* 6.3* 9.3*  HCT 36.9 28.7* 29.0* 28.1* 21.0* 19.3* 27.6*  MCV 76.9* 76.3* 75.1* 76.6* 73.2* 73.7*  --   PLT 234 183 214 223 250 267  --     Cardiac Enzymes: No results for input(s): CKTOTAL, CKMB, CKMBINDEX, TROPONINI in the last 168 hours. BNP (last 3 results) No results for input(s): PROBNP in the last 8760 hours. CBG: Recent Labs  Lab 07/26/21 1524 07/26/21 1528 07/26/21 1708 07/26/21 2039 07/27/21 0637  GLUCAP 288* 277* 227* 188* 145*    D-Dimer: No results for input(s): DDIMER in the  last 72 hours. Hgb A1c: No results for input(s): HGBA1C in the last 72 hours.  Lipid Profile: No results for input(s): CHOL, HDL, LDLCALC, TRIG, CHOLHDL, LDLDIRECT in the last 72 hours. Thyroid function studies: No results for input(s): TSH, T4TOTAL, T3FREE, THYROIDAB in the last 72 hours.  Invalid input(s): FREET3 Anemia work up: No results for input(s): VITAMINB12, FOLATE, FERRITIN, TIBC, IRON, RETICCTPCT in the last 72 hours. Sepsis Labs: Recent Labs  Lab 07/23/21 0623 07/24/21 0254 07/25/21 0726 07/26/21 0326  WBC 7.5 14.7* 10.4 9.8    Microbiology Recent Results (from the past 240 hour(s))  Resp Panel by RT-PCR (Flu A&B, Covid) Nasopharyngeal Swab     Status: None   Collection Time: 07/21/21  3:21 PM   Specimen: Nasopharyngeal Swab;  Nasopharyngeal(NP) swabs in vial transport medium  Result Value Ref Range Status   SARS Coronavirus 2 by RT PCR NEGATIVE NEGATIVE Final    Comment: (NOTE) SARS-CoV-2 target nucleic acids are NOT DETECTED.  The SARS-CoV-2 RNA is generally detectable in upper respiratory specimens during the acute phase of infection. The lowest concentration of SARS-CoV-2 viral copies this assay can detect is 138 copies/mL. A negative result does not preclude SARS-Cov-2 infection and should not be used as the sole basis for treatment or other patient management decisions. A negative result may occur with  improper specimen collection/handling, submission of specimen other than nasopharyngeal swab, presence of viral mutation(s) within the areas targeted by this assay, and inadequate number of viral copies(<138 copies/mL). A negative result must be combined with clinical observations, patient history, and epidemiological information. The expected result is Negative.  Fact Sheet for Patients:  EntrepreneurPulse.com.au  Fact Sheet for Healthcare Providers:  IncredibleEmployment.be  This test is no t yet approved or  cleared by the [place] FDA and  has been authorized for detection and/or diagnosis of SARS-CoV-2 by FDA under an Emergency Use Authorization (EUA). This EUA will remain  in effect (meaning this test can be used) for the duration of the COVID-19 declaration under Section 564(b)(1) of the Act, 21 U.S.C.section 360bbb-3(b)(1), unless the authorization is terminated  or revoked sooner.       Influenza A by PCR NEGATIVE NEGATIVE Final   Influenza B by PCR NEGATIVE NEGATIVE Final    Comment: (NOTE) The Xpert Xpress SARS-CoV-2/FLU/RSV plus assay is intended as an aid in the diagnosis of influenza from Nasopharyngeal swab specimens and should not be used as a sole basis for treatment. Nasal washings and aspirates are unacceptable for Xpert Xpress SARS-CoV-2/FLU/RSV testing.  Fact Sheet for Patients: EntrepreneurPulse.com.au  Fact Sheet for Healthcare Providers: IncredibleEmployment.be  This test is not yet approved or cleared by the [place] FDA and has been authorized for detection and/or diagnosis of SARS-CoV-2 by FDA under an Emergency Use Authorization (EUA). This EUA will remain in effect (meaning this test can be used) for the duration of the COVID-19 declaration under Section 564(b)(1) of the Act, 21 U.S.C. section 360bbb-3(b)(1), unless the authorization is terminated or revoked.  Performed at [facility], [place] [address], [place], [place] [address]   Surgical pcr screen     Status: Abnormal   Collection Time: 07/22/21 11:53 PM   Specimen: Nasal Mucosa; Nasal Swab  Result Value Ref Range Status   MRSA, PCR NEGATIVE NEGATIVE Final   Staphylococcus aureus POSITIVE (A) NEGATIVE Final    Comment: (NOTE) The Xpert SA Assay (FDA approved for NASAL specimens in patients 43 years of age and older), is one component of a comprehensive surveillance program. It is not intended to diagnose infection nor to guide or monitor  treatment. Performed at [facility], [place] [address] [place], [place] [address]      Medications:    sodium chloride   Intravenous Once   amLODipine  5 mg Oral Daily   atorvastatin  20 mg Oral Daily   Chlorhexidine Gluconate Cloth  6 each Topical Daily   docusate sodium  100 mg Oral BID   donepezil  5 mg Oral QHS   insulin aspart  0-6 Units Subcutaneous TID WC   lanthanum  1,000 mg Oral BID WC   loratadine  10 mg Oral Daily   melatonin  3 mg Oral QHS   metoprolol tartrate  12.5 mg Oral  BID   senna  1 tablet Oral BID   Continuous Infusions:     LOS: 6 days   Charlynne Cousins  [facility]  07/27/2021, 8:32 AM

## 2021-07-28 DIAGNOSIS — Z419 Encounter for procedure for purposes other than remedying health state, unspecified: Secondary | ICD-10-CM

## 2021-07-28 DIAGNOSIS — Z96641 Presence of right artificial hip joint: Secondary | ICD-10-CM

## 2021-07-28 LAB — CBC
HCT: 28.5 % — ABNORMAL LOW (ref 36.0–46.0)
Hemoglobin: 9.3 g/dL — ABNORMAL LOW (ref 12.0–15.0)
MCH: 24.4 pg — ABNORMAL LOW (ref 26.0–34.0)
MCHC: 32.6 g/dL (ref 30.0–36.0)
MCV: 74.8 fL — ABNORMAL LOW (ref 80.0–100.0)
Platelets: 324 10*3/uL (ref 150–400)
RBC: 3.81 MIL/uL — ABNORMAL LOW (ref 3.87–5.11)
RDW: 17 % — ABNORMAL HIGH (ref 11.5–15.5)
WBC: 11.9 10*3/uL — ABNORMAL HIGH (ref 4.0–10.5)
nRBC: 0 % (ref 0.0–0.2)

## 2021-07-28 LAB — RENAL FUNCTION PANEL
Albumin: 2.4 g/dL — ABNORMAL LOW (ref 3.5–5.0)
Anion gap: 9 (ref 5–15)
BUN: 38 mg/dL — ABNORMAL HIGH (ref 8–23)
CO2: 26 mmol/L (ref 22–32)
Calcium: 8.1 mg/dL — ABNORMAL LOW (ref 8.9–10.3)
Chloride: 91 mmol/L — ABNORMAL LOW (ref 98–111)
Creatinine, Ser: 5.21 mg/dL — ABNORMAL HIGH (ref 0.44–1.00)
GFR, Estimated: 8 mL/min — ABNORMAL LOW (ref >60)
Glucose, Bld: 162 mg/dL — ABNORMAL HIGH (ref 70–99)
Phosphorus: 2.5 mg/dL (ref 2.5–4.6)
Potassium: 3.7 mmol/L (ref 3.5–5.1)
Sodium: 126 mmol/L — ABNORMAL LOW (ref 135–145)

## 2021-07-28 LAB — GLUCOSE, CAPILLARY
Glucose-Capillary: 147 mg/dL — ABNORMAL HIGH (ref 70–99)
Glucose-Capillary: 134 mg/dL — ABNORMAL HIGH (ref 70–99)
Glucose-Capillary: 198 mg/dL — ABNORMAL HIGH (ref 70–99)
Glucose-Capillary: 125 mg/dL — ABNORMAL HIGH (ref 70–99)

## 2021-07-28 LAB — HEMOGLOBIN AND HEMATOCRIT, BLOOD
HCT: 26.3 % — ABNORMAL LOW (ref 36.0–46.0)
Hemoglobin: 8.5 g/dL — ABNORMAL LOW (ref 12.0–15.0)

## 2021-07-28 MED ORDER — APIXABAN 2.5 MG PO TABS
2.5000 mg | ORAL_TABLET | Freq: Two times a day (BID) | ORAL | Status: DC
  Administered 2021-07-28 – 2021-07-31 (×6): 2.5 mg via ORAL
  Filled 2021-07-28 (×6): qty 1

## 2021-07-28 NOTE — Progress Notes (Signed)
ANTICOAGULATION CONSULT NOTE - Initial Consult  Pharmacy Consult for apixaban Indication: atrial fibrillation  Allergies  Allergen Reactions   Penicillins Swelling and Rash    Patient Measurements: Height: 5' (152.4 cm) Weight: 51.4 kg (113 lb 5.1 oz) IBW/kg (Calculated) : 45.5   Vital Signs: Temp: 98.1 F (36.7 C) (12/21 1501) Temp Source: Oral (12/21 1434) BP: 146/68 (12/21 1501) Pulse Rate: 100 (12/21 1434)  Labs: Recent Labs    07/26/21 0326 07/26/21 1905 07/28/21 0359 07/28/21 0750 07/28/21 0910  HGB 6.3* 9.3*  --  9.3* 8.5*  HCT 19.3* 27.6*  --  28.5* 26.3*  PLT 267  --   --  324  --   CREATININE  --   --  5.21*  --   --    Estimated Creatinine Clearance: 5.9 mL/min (A) (by C-G formula based on SCr of 5.21 mg/dL (H)).   Medical History: Past Medical History:  Diagnosis Date   Arthritis    RA   CHF (congestive heart failure) (HCC)    CKD (chronic kidney disease) stage 3, GFR 30-59 ml/min (HCC)    M-W-F dialysis   Dementia (HCC)    Diabetes mellitus without complication (HCC)    insulin dependent   Hyperlipidemia    Hypertension    Seasonal allergies    Shortness of breath dyspnea    occasional with exertion - no oxygen   Sleep apnea    does not use cpap   TIA (transient ischemic attack) 2015      Assessment: 85 yo W on apixaban PTA for afib. Held for procedures and anemia requiring 2 units RBC on 12/19. Now Hgb stable and pharmacy consulted to restart apixaban/   Goal of Therapy:  Monitor platelets by anticoagulation protocol: Yes   Plan:  Restart apixaban 2.5mg  BID    Monitor for signs/symptoms of bleeding     Benetta Spar, PharmD, BCPS, BCCP Clinical Pharmacist  Please check AMION for all [place] phone numbers After 10:00 PM, call [place] [phone]

## 2021-07-28 NOTE — Progress Notes (Signed)
[facility]  PROGRESS NOTE  Seneca Gadbois Muscatello QJJ:[id number] DOB: 1935-08-21 DOA: 07/21/2021 PCP: [name], MD   Brief HPI:   85 year old female with past medical history of ESRD on hemodialysis, diabetes mellitus type 2 diet-controlled, essential hypertension, paroxysmal atrial fibrillation on Eliquis, ambulatory dysfunction came in with right hip pain after mechanical fall.  CT of the hip showed nondisplaced impacted subcapital right hip fracture.  Eliquis was on hold.  Orthopedic surgery was consulted.  Patient underwent ORIF.    Subjective   Patient seen and examined, denies any pain.   Assessment/Plan:    Right hip fracture -Secondary to mechanical fall -Orthopedic surgery was consulted -Patient underwent right total hip arthroplasty -She received 2 units PRBC after surgery -Physical therapy saw patient and recommended home health PT  Acute blood loss anemia/anemia chronic disease -Patient hemoglobin dropped after surgery, required 3 units PRBC -Eliquis was held -This morning hemoglobin is 8.5 -We will restart Eliquis  End-stage renal disease -Nephrology was consulted -Patient getting hemodialysis  Paroxysmal atrial fibrillation -Continue metoprolol -We will restart Eliquis today  Hypertension -Continue amlodipine, metoprolol  Diabetes mellitus type 2 -Hemoglobin A1c 6.4 -Continue sliding scale insulin with NovoLog  Moderate dementia -Continue Aricept  Chronic diastolic heart failure -Euvolemic  Acute confusional state -Resolved        Medications     sodium chloride   Intravenous Once   amLODipine  10 mg Oral Daily   atorvastatin  20 mg Oral Daily   Chlorhexidine Gluconate Cloth  6 each Topical Daily   docusate sodium  100 mg Oral BID   donepezil  5 mg Oral QHS   insulin aspart  0-6 Units Subcutaneous TID WC   lanthanum  1,000 mg Oral BID WC   loratadine  10 mg Oral Daily   melatonin  3 mg Oral QHS   metoprolol tartrate   12.5 mg Oral BID   mupirocin ointment  1 application Nasal BID   senna  1 tablet Oral BID     Data Reviewed:   CBG:  Recent Labs  Lab 07/27/21 2142 07/27/21 2231 07/28/21 0655 07/28/21 1431 07/28/21 1603  GLUCAP 165* 182* 147* 134* 198*    SpO2: 99 % O2 Flow Rate (L/min): 2 L/min    Vitals:   07/28/21 1230 07/28/21 1235 07/28/21 1434 07/28/21 1501  BP: (!) 152/68 (!) 149/60 (!) 125/58 (!) 146/68  Pulse: 74 91 100   Resp:  12    Temp:  (!) 97.5 F (36.4 C) 98.6 F (37 C) 98.1 F (36.7 C)  TempSrc:  Temporal Oral   SpO2:  95% 99%   Weight:  51.4 kg    Height:         Intake/Output Summary (Last 24 hours) at 07/28/2021 1750 Last data filed at 07/28/2021 1550 Gross per 24 hour  Intake 240 ml  Output 1000 ml  Net -760 ml    12/19 1901 - 12/21 0700 In: 120 [P.O.:120] Out: -   Filed Weights   07/26/21 1235 07/28/21 0818 07/28/21 1235  Weight: 52.6 kg 53.8 kg 51.4 kg    Data Reviewed: Basic Metabolic Panel: Recent Labs  Lab 07/22/21 0340 07/23/21 0623 07/24/21 0254 07/25/21 0726 07/28/21 0359  NA 136 135 133* 131* 126*  K 3.3* 3.8 4.7 4.1 3.7  CL 103 101 99 96* 91*  CO2 27 25 21* 22 26  GLUCOSE 128* 139* 159* 208* 162*  BUN 33* 44* 30* 53* 38*  CREATININE 5.42* 6.64* 4.49* 6.37* 5.21*  CALCIUM 8.3* 8.3* 8.1* 8.2* 8.1*  PHOS  --  2.9  --   --  2.5   Liver Function Tests: Recent Labs  Lab 07/23/21 0623 07/28/21 0359  ALBUMIN 2.5* 2.4*   No results for input(s): LIPASE, AMYLASE in the last 168 hours. No results for input(s): AMMONIA in the last 168 hours. CBC: Recent Labs  Lab 07/23/21 0623 07/24/21 0254 07/25/21 0726 07/26/21 0326 07/26/21 1905 07/28/21 0750 07/28/21 0910  WBC 7.5 14.7* 10.4 9.8  --  11.9*  --   HGB 9.0* 8.5* 6.9* 6.3* 9.3* 9.3* 8.5*  HCT 29.0* 28.1* 21.0* 19.3* 27.6* 28.5* 26.3*  MCV 75.1* 76.6* 73.2* 73.7*  --  74.8*  --   PLT 214 223 250 267  --  324  --    Cardiac Enzymes: No results for input(s): CKTOTAL,  CKMB, CKMBINDEX, TROPONINI in the last 168 hours. BNP (last 3 results) Recent Labs    07/31/20 1821  BNP 113.0*    ProBNP (last 3 results) No results for input(s): PROBNP in the last 8760 hours.  CBG: Recent Labs  Lab 07/27/21 2142 07/27/21 2231 07/28/21 0655 07/28/21 1431 07/28/21 1603  GLUCAP 165* 182* 147* 134* 198*       Radiology Reports  No results found.     Antibiotics: Anti-infectives (From admission, onward)    Start     Dose/Rate Route Frequency Ordered Stop   07/23/21 2130  vancomycin (VANCOCIN) IVPB 1000 mg/200 mL premix  Status:  Discontinued        1,000 mg 200 mL/hr over 60 Minutes Intravenous Every 12 hours 07/23/21 2032 07/23/21 2037   07/23/21 0600  vancomycin (VANCOCIN) IVPB 1000 mg/200 mL premix        1,000 mg 200 mL/hr over 60 Minutes Intravenous On call to O.R. 07/22/21 1914 07/23/21 1553         DVT prophylaxis: Apixaban on hold due to drop in hemoglobin  Code Status: Full code  Family Communication: No family at bedside   Consultants: Nephrology Orthopedics  Procedures:     Objective    Physical Examination:   General-appears in no acute distress Heart-S1-S2, regular, no murmur auscultated Lungs-clear to auscultation bilaterally, no wheezing or crackles auscultated Abdomen-soft, nontender, no organomegaly Extremities-no edema in the lower extremities Neuro-alert, oriented x3, no focal deficit noted Status is: Inpatient  Dispo: The patient is from: Home              Anticipated d/c is to: Home              Anticipated d/c date is: 07/29/2021              Patient currently stable for discharge  Barrier to discharge-awaiting outpatient hemodialysis set up  COVID-19 Labs  No results for input(s): DDIMER, FERRITIN, LDH, CRP in the last 72 hours.  Lab Results  Component Value Date   SARSCOV2NAA NEGATIVE 07/21/2021   SARSCOV2NAA NEGATIVE 12/08/2020   SARSCOV2NAA POSITIVE (A) 07/25/2020             Recent Results (from the past 240 hour(s))  Resp Panel by RT-PCR (Flu A&B, Covid) Nasopharyngeal Swab     Status: None   Collection Time: 07/21/21  3:21 PM   Specimen: Nasopharyngeal Swab; Nasopharyngeal(NP) swabs in vial transport medium  Result Value Ref Range Status   SARS Coronavirus 2 by RT PCR NEGATIVE NEGATIVE Final    Comment: (NOTE) SARS-CoV-2 target nucleic acids are NOT DETECTED.  The SARS-CoV-2 RNA is generally  detectable in upper respiratory specimens during the acute phase of infection. The lowest concentration of SARS-CoV-2 viral copies this assay can detect is 138 copies/mL. A negative result does not preclude SARS-Cov-2 infection and should not be used as the sole basis for treatment or other patient management decisions. A negative result may occur with  improper specimen collection/handling, submission of specimen other than nasopharyngeal swab, presence of viral mutation(s) within the areas targeted by this assay, and inadequate number of viral copies(<138 copies/mL). A negative result must be combined with clinical observations, patient history, and epidemiological information. The expected result is Negative.  Fact Sheet for Patients:  EntrepreneurPulse.com.au  Fact Sheet for Healthcare Providers:  IncredibleEmployment.be  This test is no t yet approved or cleared by the [place] FDA and  has been authorized for detection and/or diagnosis of SARS-CoV-2 by FDA under an Emergency Use Authorization (EUA). This EUA will remain  in effect (meaning this test can be used) for the duration of the COVID-19 declaration under Section 564(b)(1) of the Act, 21 U.S.C.section 360bbb-3(b)(1), unless the authorization is terminated  or revoked sooner.       Influenza A by PCR NEGATIVE NEGATIVE Final   Influenza B by PCR NEGATIVE NEGATIVE Final    Comment: (NOTE) The Xpert Xpress SARS-CoV-2/FLU/RSV plus assay is intended  as an aid in the diagnosis of influenza from Nasopharyngeal swab specimens and should not be used as a sole basis for treatment. Nasal washings and aspirates are unacceptable for Xpert Xpress SARS-CoV-2/FLU/RSV testing.  Fact Sheet for Patients: EntrepreneurPulse.com.au  Fact Sheet for Healthcare Providers: IncredibleEmployment.be  This test is not yet approved or cleared by the [place] FDA and has been authorized for detection and/or diagnosis of SARS-CoV-2 by FDA under an Emergency Use Authorization (EUA). This EUA will remain in effect (meaning this test can be used) for the duration of the COVID-19 declaration under Section 564(b)(1) of the Act, 21 U.S.C. section 360bbb-3(b)(1), unless the authorization is terminated or revoked.  Performed at [facility], [place] [address] [place], [place] [address]   Surgical pcr screen     Status: Abnormal   Collection Time: 07/22/21 11:53 PM   Specimen: Nasal Mucosa; Nasal Swab  Result Value Ref Range Status   MRSA, PCR NEGATIVE NEGATIVE Final   Staphylococcus aureus POSITIVE (A) NEGATIVE Final    Comment: (NOTE) The Xpert SA Assay (FDA approved for NASAL specimens in patients 35 years of age and older), is one component of a comprehensive surveillance program. It is not intended to diagnose infection nor to guide or monitor treatment. Performed at [facility], [place] [address] [place],  [address]     [facility] If 7PM-7AM, please contact night-coverage at [email or web address], Office  [phone]   07/28/2021, 5:50 PM  LOS: 7 days

## 2021-07-28 NOTE — Progress Notes (Signed)
Physical Therapy Treatment Patient Details Name: [name] [name] MRN: [id number] DOB: June 25, 1936 Today's Date: 07/28/2021   History of Present Illness Pt. is 85 yr old F admitted on 07/21/21 s/p fall resulting in R LE pain with inability to bear weight.  Imaging (+) for R subcapital femoral neck fx. Pt. underwent direct ant THA on 12/16. PMH: arthritis, CHF, CKD, dementia, DM, HTN, sleep apnea, TIA, ESRD on HD    PT Comments    Received pt slouched down in bed requesting to sit more upright and take meds. Pt performed bed mobility with min A for RLE management and sat EOB with close supervision while RN provided medications. Pt declined ambulating due to fatigue from dialysis. Performed x3 L lateral scoots to [facility] with CGA and returned to semi-reclined - therapist assisted with optimal positioning for comfort. Pt left with all needs within reach. Acute PT to cont to follow.     Recommendations for follow up therapy are one component of a multi-disciplinary discharge planning process, led by the attending physician.  Recommendations may be updated based on patient status, additional functional criteria and insurance authorization.  Follow Up Recommendations  Home health PT     Assistance Recommended at Discharge Frequent or constant Supervision/Assistance  Equipment Recommendations  Rolling walker (2 wheels);BSC/3in1    Recommendations for Other Services       Precautions / Restrictions Precautions Precautions: Fall Restrictions Weight Bearing Restrictions: Yes RLE Weight Bearing: Weight bearing as tolerated     Mobility  Bed Mobility Overal bed mobility: Needs Assistance Bed Mobility: Supine to Sit;Sit to Supine     Supine to sit: Min assist Sit to supine: Min assist   General bed mobility comments: required min A for RLE management - HOB elevated and use of bedrails Patient Response: Cooperative  Transfers                        Ambulation/Gait                    Stairs             Wheelchair Mobility    Modified Rankin (Stroke Patients Only)       Balance Overall balance assessment: Needs assistance Sitting-balance support: Bilateral upper extremity supported;Feet supported Sitting balance-Leahy Scale: Fair Sitting balance - Comments: sitting EOB while taking medications                                    Cognition Arousal/Alertness: Awake/alert Behavior During Therapy: WFL for tasks assessed/performed Overall Cognitive Status: Within Functional Limits for tasks assessed                                 General Comments: Pt slouched down in be upon arrival requesting to sit up higher. Pt very pleasant and cooperative but declined walking due to fatigue and just getting back from dialysis        Exercises      General Comments        Pertinent Vitals/Pain Pain Assessment: No/denies pain    Home Living                          Prior Function            PT Goals (current  goals can now be found in the care plan section) Acute Rehab PT Goals Patient Stated Goal: not stated PT Goal Formulation: With patient/family Time For Goal Achievement: 08/07/21 Potential to Achieve Goals: Good Progress towards PT goals: Progressing toward goals    Frequency    Min 5X/week      PT Plan Current plan remains appropriate    Co-evaluation              AM-PAC PT "6 Clicks" Mobility   Outcome Measure  Help needed turning from your back to your side while in a flat bed without using bedrails?: A Little Help needed moving from lying on your back to sitting on the side of a flat bed without using bedrails?: A Little Help needed moving to and from a bed to a chair (including a wheelchair)?: A Little Help needed standing up from a chair using your arms (e.g., wheelchair or bedside chair)?: A Little Help needed to walk in hospital room?: A Little Help needed  climbing 3-5 steps with a railing? : A Lot 6 Click Score: 17    End of Session   Activity Tolerance: Patient limited by fatigue Patient left: in bed;with call bell/phone within reach;with bed alarm set;with family/visitor present Nurse Communication: Mobility status PT Visit Diagnosis: Other abnormalities of gait and mobility (R26.89);Unsteadiness on feet (R26.81);History of falling (Z91.81);Muscle weakness (generalized) (M62.81)     Time: 1449-1500 PT Time Calculation (min) (ACUTE ONLY): 11 min  Charges:  $Therapeutic Activity: 8-22 mins                     Becky Sax PT, DPT  Blenda Nicely 07/28/2021, 3:07 PM

## 2021-07-28 NOTE — Procedures (Signed)
I was present at this dialysis session. I have reviewed the session itself and made appropriate changes.   Vital signs in last 24 hours:  Temp:  [98.3 F (36.8 C)-98.9 F (37.2 C)] 98.6 F (37 C) (12/20 2118) Pulse Rate:  [91-102] 95 (12/20 2118) Resp:  [17-18] 17 (12/20 2118) BP: (116-138)/(55-59) 133/55 (12/20 2118) SpO2:  [96 %-100 %] 96 % (12/20 2118) Weight change:  Filed Weights   07/23/21 1150 07/26/21 0758 07/26/21 1235  Weight: 54.3 kg 54.5 kg 52.6 kg    Recent Labs  Lab 07/28/21 0359  NA 126*  K 3.7  CL 91*  CO2 26  GLUCOSE 162*  BUN 38*  CREATININE 5.21*  CALCIUM 8.1*  PHOS 2.5    Recent Labs  Lab 07/21/21 1454 07/22/21 0340 07/25/21 0726 07/26/21 0326 07/26/21 1905 07/28/21 0750  WBC 10.5   < > 10.4 9.8  --  11.9*  NEUTROABS 8.8*  --   --   --   --   --   HGB 11.3*   < > 6.9* 6.3* 9.3* 9.3*  HCT 36.9   < > 21.0* 19.3* 27.6* 28.5*  MCV 76.9*   < > 73.2* 73.7*  --  74.8*  PLT 234   < > 250 267  --  324   < > = values in this interval not displayed.    Scheduled Meds:  sodium chloride   Intravenous Once   amLODipine  10 mg Oral Daily   atorvastatin  20 mg Oral Daily   Chlorhexidine Gluconate Cloth  6 each Topical Daily   docusate sodium  100 mg Oral BID   donepezil  5 mg Oral QHS   insulin aspart  0-6 Units Subcutaneous TID WC   lanthanum  1,000 mg Oral BID WC   loratadine  10 mg Oral Daily   melatonin  3 mg Oral QHS   metoprolol tartrate  12.5 mg Oral BID   mupirocin ointment  1 application Nasal BID   senna  1 tablet Oral BID   Continuous Infusions:  sodium chloride     sodium chloride     PRN Meds:.sodium chloride, sodium chloride, acetaminophen, alteplase, bisacodyl, heparin, HYDROcodone-acetaminophen, HYDROcodone-acetaminophen, lidocaine (PF), menthol-cetylpyridinium **OR** phenol, metoCLOPramide **OR** metoCLOPramide (REGLAN) injection, morphine injection, ondansetron **OR** ondansetron (ZOFRAN) IV, pentafluoroprop-tetrafluoroeth,  QUEtiapine   Donetta Potts,  MD 07/28/2021, 8:34 AM

## 2021-07-28 NOTE — Progress Notes (Addendum)
Pt receives out-pt HD at [facility] on MWF. Pt arrives at 10:35 for 10:55 chair time. MD note states pt to stay with dtr in [place] at d/c. Message left for pt's dtr, Carylon, to discuss plans for out-pt HD at d/c. Will assist as needed.    Melven Sartorius Renal Navigator [phone]  Addendum at 2:14 pm: Spoke to pt's dtr, Lannette Donath [phone]), via phone. Dtr has voiced interest in pt being transferred to a clinic closer to [facility] while pt staying with dtr. [facility] is full. Spoke clinic Freight forwarder at [place] who states they have availability to accept pt as a transient while pt staying with dtr. Message left at [facility] to request pt be re-clipped to [facility]. Update provided to pt's dtr regarding availability at [facility]. Dtr agreeable to proceed. Dtr inquiring about transportation services that will transport pt from [facility] to [place]. CSW made aware of this request and provided dtr's contact info. Dtr states that pt's family provided transportation to/from HD prior to admission. Will follow and assist.

## 2021-07-29 LAB — CBC
HCT: 31 % — ABNORMAL LOW (ref 36.0–46.0)
Hemoglobin: 9.9 g/dL — ABNORMAL LOW (ref 12.0–15.0)
MCH: 24.5 pg — ABNORMAL LOW (ref 26.0–34.0)
MCHC: 31.9 g/dL (ref 30.0–36.0)
MCV: 76.7 fL — ABNORMAL LOW (ref 80.0–100.0)
Platelets: 351 10*3/uL (ref 150–400)
RBC: 4.04 MIL/uL (ref 3.87–5.11)
RDW: 17.1 % — ABNORMAL HIGH (ref 11.5–15.5)
WBC: 11.6 10*3/uL — ABNORMAL HIGH (ref 4.0–10.5)
nRBC: 0 % (ref 0.0–0.2)

## 2021-07-29 LAB — BASIC METABOLIC PANEL
Anion gap: 9 (ref 5–15)
BUN: 20 mg/dL (ref 8–23)
CO2: 29 mmol/L (ref 22–32)
Calcium: 7.6 mg/dL — ABNORMAL LOW (ref 8.9–10.3)
Chloride: 93 mmol/L — ABNORMAL LOW (ref 98–111)
Creatinine, Ser: 3.62 mg/dL — ABNORMAL HIGH (ref 0.44–1.00)
GFR, Estimated: 12 mL/min — ABNORMAL LOW (ref >60)
Glucose, Bld: 131 mg/dL — ABNORMAL HIGH (ref 70–99)
Potassium: 3.4 mmol/L — ABNORMAL LOW (ref 3.5–5.1)
Sodium: 131 mmol/L — ABNORMAL LOW (ref 135–145)

## 2021-07-29 LAB — GLUCOSE, CAPILLARY
Glucose-Capillary: 105 mg/dL — ABNORMAL HIGH (ref 70–99)
Glucose-Capillary: 89 mg/dL (ref 70–99)
Glucose-Capillary: 178 mg/dL — ABNORMAL HIGH (ref 70–99)
Glucose-Capillary: 149 mg/dL — ABNORMAL HIGH (ref 70–99)

## 2021-07-29 MED ORDER — POTASSIUM CHLORIDE 20 MEQ PO PACK
20.0000 meq | PACK | Freq: Once | ORAL | Status: AC
Start: 2021-07-29 — End: 2021-07-29
  Administered 2021-07-29: 12:00:00 20 meq via ORAL
  Filled 2021-07-29: qty 1

## 2021-07-29 NOTE — Progress Notes (Addendum)
Spoke to [facility] this am. They received pt's info from [place]. Will f/u with clinic early afternoon to see if pt has been accepted and when pt can start.   Melven Sartorius Renal Navigator [phone]  Addendum at 2:26 pm: Pt has been accepted at [facility] ([facility]) on a MWF schedule. Pt can start tomorrow. Pt needs to arrive at 10:30 for 11:00 chair time. Spoke to pt's dtr, [name] [name], to make her aware of this info. Dtr inquiring about transportation. Informed dtr that navigator was informed by [facility] staff that there are no options available and family will need to transport. Dtr would like to speak to [facility] staff for further explanation. Message sent to RN CM and SW with dtr's request. Update provided to attending regarding acceptance at [facility].   Addendum at 4:43 pm: Case discussed with RN CM who has spoken to pt's dtr regarding transportation to/from HD. Contacted inpt HD unit to request that pt be placed on 2nd shift tomorrow in case pt needs inpt HD. Pt can receive HD at out-pt clinic tomorrow if pt arrives at 10:30.

## 2021-07-29 NOTE — Progress Notes (Signed)
Patient ID: [name] [name], female   DOB: [birth year], 85 y.o.   MRN: [id number] S: No events overnight.  Tolerated HD without issue. O:BP (!) 134/58 (BP Location: Right Arm)    Pulse 98    Temp 98.4 F (36.9 C) (Oral)    Resp 17    Ht 5' (1.524 m)    Wt 51.4 kg    SpO2 100%    BMI 22.13 kg/m   Intake/Output Summary (Last 24 hours) at 07/29/2021 1055 Last data filed at 07/29/2021 0900 Gross per 24 hour  Intake 980 ml  Output 1000 ml  Net -20 ml   Intake/Output: I/O last 3 completed shifts: In: 740 [P.O.:740] Out: 1000 [Other:1000]  Intake/Output this shift:  Total I/O In: 240 [P.O.:240] Out: -  Weight change:  Gen: NAD CVS: RRR Resp:CTA Abd: +BS, soft, Nt/ND Ext: no edema, LUE AVG +T/B  Recent Labs  Lab 07/23/21 0623 07/24/21 0254 07/25/21 0726 07/28/21 0359 07/29/21 0250  NA 135 133* 131* 126* 131*  K 3.8 4.7 4.1 3.7 3.4*  CL 101 99 96* 91* 93*  CO2 25 21* 22 26 29   GLUCOSE 139* 159* 208* 162* 131*  BUN 44* 30* 53* 38* 20  CREATININE 6.64* 4.49* 6.37* 5.21* 3.62*  ALBUMIN 2.5*  --   --  2.4*  --   CALCIUM 8.3* 8.1* 8.2* 8.1* 7.6*  PHOS 2.9  --   --  2.5  --    Liver Function Tests: Recent Labs  Lab 07/23/21 0623 07/28/21 0359  ALBUMIN 2.5* 2.4*   No results for input(s): LIPASE, AMYLASE in the last 168 hours. No results for input(s): AMMONIA in the last 168 hours. CBC: Recent Labs  Lab 07/23/21 0623 07/24/21 0254 07/25/21 0726 07/26/21 0326 07/26/21 1905 07/28/21 0750 07/28/21 0910  WBC 7.5 14.7* 10.4 9.8  --  11.9*  --   HGB 9.0* 8.5* 6.9* 6.3* 9.3* 9.3* 8.5*  HCT 29.0* 28.1* 21.0* 19.3* 27.6* 28.5* 26.3*  MCV 75.1* 76.6* 73.2* 73.7*  --  74.8*  --   PLT 214 223 250 267  --  324  --    Cardiac Enzymes: No results for input(s): CKTOTAL, CKMB, CKMBINDEX, TROPONINI in the last 168 hours. CBG: Recent Labs  Lab 07/28/21 0655 07/28/21 1431 07/28/21 1603 07/28/21 2105 07/29/21 0646  GLUCAP 147* 134* 198* 125* 105*    Iron Studies: No  results for input(s): IRON, TIBC, TRANSFERRIN, FERRITIN in the last 72 hours. Studies/Results: No results found.  sodium chloride   Intravenous Once   amLODipine  10 mg Oral Daily   apixaban  2.5 mg Oral BID   atorvastatin  20 mg Oral Daily   Chlorhexidine Gluconate Cloth  6 each Topical Daily   docusate sodium  100 mg Oral BID   donepezil  5 mg Oral QHS   insulin aspart  0-6 Units Subcutaneous TID WC   lanthanum  1,000 mg Oral BID WC   loratadine  10 mg Oral Daily   melatonin  3 mg Oral QHS   metoprolol tartrate  12.5 mg Oral BID   mupirocin ointment  1 application Nasal BID   potassium chloride  20 mEq Oral Once   senna  1 tablet Oral BID    BMET    Component Value Date/Time   NA 131 (L) 07/29/2021 0250   NA 138 04/01/2021 1521   K 3.4 (L) 07/29/2021 0250   CL 93 (L) 07/29/2021 0250   CO2 29 07/29/2021 0250  GLUCOSE 131 (H) 07/29/2021 0250   BUN 20 07/29/2021 0250   BUN 21 04/01/2021 1521   CREATININE 3.62 (H) 07/29/2021 0250   CALCIUM 7.6 (L) 07/29/2021 0250   CALCIUM 8.8 01/15/2018 1005   GFRNONAA 12 (L) 07/29/2021 0250   GFRAA 18 (L) 05/05/2020 1237   CBC    Component Value Date/Time   WBC 11.9 (H) 07/28/2021 0750   RBC 3.81 (L) 07/28/2021 0750   HGB 8.5 (L) 07/28/2021 0910   HGB 10.8 (L) 04/01/2021 1521   HCT 26.3 (L) 07/28/2021 0910   HCT 36.5 04/01/2021 1521   PLT 324 07/28/2021 0750   PLT 205 04/01/2021 1521   MCV 74.8 (L) 07/28/2021 0750   MCV 79 04/01/2021 1521   MCH 24.4 (L) 07/28/2021 0750   MCHC 32.6 07/28/2021 0750   RDW 17.0 (H) 07/28/2021 0750   RDW 19.8 (H) 04/01/2021 1521   LYMPHSABS 1.0 07/21/2021 1454   LYMPHSABS 1.8 04/01/2021 1521   MONOABS 0.6 07/21/2021 1454   EOSABS 0.0 07/21/2021 1454   EOSABS 0.1 04/01/2021 1521   BASOSABS 0.0 07/21/2021 1454   BASOSABS 0.0 04/01/2021 1521    Dialysis Orders: [facility] M[facility] 3h 45 min 55kg 400/1.5A 2K/2.5Ca  AVG No heparin  Mircera 75 q 4ks (last 12/12) No VDRA    Assessment/Plan: Right hip fx  --s/p total hip replacement 12/16. Per orthopedics.  ESRD -  HD MWF. Continue on schedule. No heparin  Hypertension/volume  - Blood pressures controlled. No volume excess. At dry weight.  Anemia  - S/p total 3 units PRBCs for Hgb 6.3. Likely post-op blood loss. Hgb now 9.3 this AM. Recent ESA dose as outpatient. Follow trends.  Metabolic bone disease -  Ca ok. No VDRA. Continue home binders. Check phos with HD tomorrow.  Nutrition - Renal diet/fluid restriction when taking PO.  Dementia - appears to be at [place], [place] [phone]

## 2021-07-29 NOTE — Progress Notes (Signed)
Pt was seen with her children in attendance, and walked to chair as well as performed strengthening to BLE"s.  Her daughter is concerned that the R side of the head may have a bump.  Messaged to have team check on this.  Follow pt to encourage more stable gait, to increase LE strength and to continue to encourage her to be up OOB to chair as tolerated.  Pt is being supported well by her family, although she is still very much recovering her energy to do therapy.  Follow for goals of acute PT.  07/29/21 1604  PT Visit Information  Last PT Received On 07/29/21  Assistance Needed +1  History of Present Illness Pt. is 85 yr old F admitted on 07/21/21 s/p fall resulting in R LE pain with inability to bear weight.  Imaging (+) for R subcapital femoral neck fx. Pt. underwent direct ant THA on 12/16. PMH: arthritis, CHF, CKD, dementia, DM, HTN, sleep apnea, TIA, ESRD on HD  Subjective Data  Subjective reports she is tired and does not wish to walk, then agreed  Patient Stated Goal not stated  Precautions  Precautions Fall  Restrictions  Weight Bearing Restrictions Yes  RLE Weight Bearing WBAT  Pain Assessment  Pain Assessment Faces  Faces Pain Scale 2  Breathing 0  Negative Vocalization 0  Facial Expression 0  Body Language 0  Consolability 0  PAINAD Score 0  Pain Location R hip  Pain Descriptors / Indicators Guarding;Sore  Pain Intervention(s) Limited activity within patient's tolerance;Monitored during session;Premedicated before session;Repositioned  Cognition  Arousal/Alertness Awake/alert  Behavior During Therapy WFL for tasks assessed/performed  Overall Cognitive Status Within Functional Limits for tasks assessed  Area of Impairment Awareness;Problem solving  Current Attention Level Selective  Memory Decreased short-term memory  Following Commands Follows one step commands inconsistently;Follows one step commands with increased time  Safety/Judgement Decreased awareness of deficits   Problem Solving Slow processing  General Comments posture on bed is poor, required standing and walking to straighten up in chair  Bed Mobility  Overal bed mobility Needs Assistance  Bed Mobility Supine to Sit  Supine to sit Min assist  Transfers  Overall transfer level Needs assistance  Equipment used Rolling walker (2 wheels)  Transfers Sit to/from Stand  Sit to Stand Min assist  Ambulation/Gait  Ambulation/Gait assistance Min assist  Gait Distance (Feet) 25 Feet (17+8)  Assistive device Rolling walker (2 wheels)  Gait Pattern/deviations Step-through pattern;Step-to pattern;Decreased stride length  General Gait Details walked around her bed with trouble accepting wgt on the RLE, requires help to maintain her mobility  Gait velocity reduced  Pre-gait activities standing wgt shift  Balance  Overall balance assessment Needs assistance  Sitting-balance support Feet supported  Sitting balance-Leahy Scale Fair  Standing balance support Bilateral upper extremity supported;During functional activity  Standing balance-Leahy Scale Poor  General Comments  General comments (skin integrity, edema, etc.) Pt was attended by her children in the room who are anxious to get her home.  Family is planning coverage of the trip there  Exercises  Exercises General Lower Extremity  Total Joint Exercises  Ankle Circles/Pumps AAROM;5 reps  Heel Slides AAROM;10 reps  Hip ABduction/ADduction 10 reps;AROM  General Exercises - Lower Extremity  Short Arc Quad Strengthening;10 reps  PT - End of Session  Equipment Utilized During Treatment Gait belt  Activity Tolerance Patient limited by fatigue  Patient left with call bell/phone within reach;with family/visitor present;in chair;with chair alarm set  Nurse Communication Mobility status  PT - Assessment/Plan  PT Plan Current plan remains appropriate  PT Visit Diagnosis Other abnormalities of gait and mobility (R26.89);Unsteadiness on feet  (R26.81);History of falling (Z91.81);Muscle weakness (generalized) (M62.81)  PT Frequency (ACUTE ONLY) Min 5X/week  Follow Up Recommendations Home health PT  Assistance recommended at discharge Frequent or constant Supervision/Assistance  PT equipment Rolling walker (2 OACZYS);AYT/0ZS0  AM-PAC PT "6 Clicks" Mobility Outcome Measure (Version 2)  Help needed turning from your back to your side while in a flat bed without using bedrails? 3  Help needed moving from lying on your back to sitting on the side of a flat bed without using bedrails? 3  Help needed moving to and from a bed to a chair (including a wheelchair)? 3  Help needed standing up from a chair using your arms (e.g., wheelchair or bedside chair)? 3  Help needed to walk in hospital room? 3  Help needed climbing 3-5 steps with a railing?  2  6 Click Score 17  Consider Recommendation of Discharge To: Home with [facility]  Progressive Mobility  What is the highest level of mobility based on the progressive mobility assessment? Level 4 (Walks with assist in room) - Balance while marching in place and cannot step forward and back - Complete  Mobility Out of bed for toileting;Out of bed to chair with meals;Ambulated with assistance in room  PT Goal Progression  Progress towards PT goals Progressing toward goals  PT Time Calculation  PT Start Time (ACUTE ONLY) 1433  PT Stop Time (ACUTE ONLY) 1459  PT Time Calculation (min) (ACUTE ONLY) 26 min  PT General Charges  $$ ACUTE PT VISIT 1 Visit  PT Treatments  $Gait Training 8-22 mins  $Therapeutic Exercise 8-22 mins    Mee Hives, PT PhD Acute Rehab Dept. Number: [place]

## 2021-07-29 NOTE — Progress Notes (Addendum)
[facility]  PROGRESS NOTE  [name] [name] VCB:[id number] DOB: 05/30/1936 DOA: 07/21/2021 PCP: [name], MD   Brief HPI:   85 year old female with past medical history of ESRD on hemodialysis, diabetes mellitus type 2 diet-controlled, essential hypertension, paroxysmal atrial fibrillation on Eliquis, ambulatory dysfunction came in with right hip pain after mechanical fall.  CT of the hip showed nondisplaced impacted subcapital right hip fracture.  Eliquis was on hold.  Orthopedic surgery was consulted.  Patient underwent ORIF.    Subjective   Patient seen and examined, no new complaints.   Assessment/Plan:    Right hip fracture -Secondary to mechanical fall -Orthopedic surgery was consulted -Patient underwent right total hip arthroplasty -She received 2 units PRBC after surgery -Physical therapy saw patient and recommended home health PT  Acute blood loss anemia/anemia chronic disease -Patient hemoglobin dropped after surgery, required 3 units PRBC -Eliquis was held -This morning hemoglobin is 8.5 -Eliquis was restarted -Check CBC today  End-stage renal disease -Nephrology was consulted -Patient getting hemodialysis  Paroxysmal atrial fibrillation -Continue metoprolol -Eliquis restarted  Hypertension -Continue amlodipine, metoprolol  Diabetes mellitus type 2 -Hemoglobin A1c 6.4 -Continue sliding scale insulin with NovoLog  Moderate dementia -Continue Aricept  Chronic diastolic heart failure -Euvolemic  Acute confusional state -Resolved        Medications     sodium chloride   Intravenous Once   amLODipine  10 mg Oral Daily   apixaban  2.5 mg Oral BID   atorvastatin  20 mg Oral Daily   Chlorhexidine Gluconate Cloth  6 each Topical Daily   docusate sodium  100 mg Oral BID   donepezil  5 mg Oral QHS   insulin aspart  0-6 Units Subcutaneous TID WC   lanthanum  1,000 mg Oral BID WC   loratadine  10 mg Oral Daily   melatonin  3  mg Oral QHS   metoprolol tartrate  12.5 mg Oral BID   mupirocin ointment  1 application Nasal BID   senna  1 tablet Oral BID     Data Reviewed:   CBG:  Recent Labs  Lab 07/28/21 1431 07/28/21 1603 07/28/21 2105 07/29/21 0646 07/29/21 1133  GLUCAP 134* 198* 125* 105* 89    SpO2: 100 % O2 Flow Rate (L/min): 2 L/min    Vitals:   07/28/21 1501 07/28/21 2038 07/29/21 0755 07/29/21 1300  BP: (!) 146/68 130/60 (!) 134/58 120/68  Pulse:  100 98 78  Resp:  19 17 18   Temp: 98.1 F (36.7 C) 98.3 F (36.8 C) 98.4 F (36.9 C) 98.4 F (36.9 C)  TempSrc:  Oral Oral Oral  SpO2:  97% 100% 100%  Weight:      Height:         Intake/Output Summary (Last 24 hours) at 07/29/2021 1458 Last data filed at 07/29/2021 0900 Gross per 24 hour  Intake 580 ml  Output --  Net 580 ml    12/20 1901 - 12/22 0700 In: 740 [P.O.:740] Out: 1000   Filed Weights   07/26/21 1235 07/28/21 0818 07/28/21 1235  Weight: 52.6 kg 53.8 kg 51.4 kg    Data Reviewed: Basic Metabolic Panel: Recent Labs  Lab 07/23/21 0623 07/24/21 0254 07/25/21 0726 07/28/21 0359 07/29/21 0250  NA 135 133* 131* 126* 131*  K 3.8 4.7 4.1 3.7 3.4*  CL 101 99 96* 91* 93*  CO2 25 21* 22 26 29   GLUCOSE 139* 159* 208* 162* 131*  BUN 44* 30* 53* 38* 20  CREATININE  6.64* 4.49* 6.37* 5.21* 3.62*  CALCIUM 8.3* 8.1* 8.2* 8.1* 7.6*  PHOS 2.9  --   --  2.5  --    Liver Function Tests: Recent Labs  Lab 07/23/21 0623 07/28/21 0359  ALBUMIN 2.5* 2.4*   No results for input(s): LIPASE, AMYLASE in the last 168 hours. No results for input(s): AMMONIA in the last 168 hours. CBC: Recent Labs  Lab 07/23/21 0623 07/24/21 0254 07/25/21 0726 07/26/21 0326 07/26/21 1905 07/28/21 0750 07/28/21 0910  WBC 7.5 14.7* 10.4 9.8  --  11.9*  --   HGB 9.0* 8.5* 6.9* 6.3* 9.3* 9.3* 8.5*  HCT 29.0* 28.1* 21.0* 19.3* 27.6* 28.5* 26.3*  MCV 75.1* 76.6* 73.2* 73.7*  --  74.8*  --   PLT 214 223 250 267  --  324  --    Cardiac  Enzymes: No results for input(s): CKTOTAL, CKMB, CKMBINDEX, TROPONINI in the last 168 hours. BNP (last 3 results) Recent Labs    07/31/20 1821  BNP 113.0*    ProBNP (last 3 results) No results for input(s): PROBNP in the last 8760 hours.  CBG: Recent Labs  Lab 07/28/21 1431 07/28/21 1603 07/28/21 2105 07/29/21 0646 07/29/21 1133  GLUCAP 134* 198* 125* 105* 89       Radiology Reports  No results found.     Antibiotics: Anti-infectives (From admission, onward)    Start     Dose/Rate Route Frequency Ordered Stop   07/23/21 2130  vancomycin (VANCOCIN) IVPB 1000 mg/200 mL premix  Status:  Discontinued        1,000 mg 200 mL/hr over 60 Minutes Intravenous Every 12 hours 07/23/21 2032 07/23/21 2037   07/23/21 0600  vancomycin (VANCOCIN) IVPB 1000 mg/200 mL premix        1,000 mg 200 mL/hr over 60 Minutes Intravenous On call to O.R. 07/22/21 1914 07/23/21 1553         DVT prophylaxis: Apixaban on hold due to drop in hemoglobin  Code Status: Full code  Family Communication: No family at bedside   Consultants: Nephrology Orthopedics  Procedures:     Objective    Physical Examination:  General-appears in no acute distress Heart-S1-S2, regular, no murmur auscultated Lungs-clear to auscultation bilaterally, no wheezing or crackles auscultated Abdomen-soft, nontender, no organomegaly Extremities-no edema in the lower extremities Neuro-alert, oriented x3, no focal deficit noted  Status is: Inpatient  Dispo: The patient is from: Home              Anticipated d/c is to: Home              Anticipated d/c date is: 07/29/2021              Patient currently stable for discharge  Barrier to discharge-awaiting outpatient hemodialysis set up  COVID-19 Labs  No results for input(s): DDIMER, FERRITIN, LDH, CRP in the last 72 hours.  Lab Results  Component Value Date   SARSCOV2NAA NEGATIVE 07/21/2021   SARSCOV2NAA NEGATIVE 12/08/2020   SARSCOV2NAA  POSITIVE (A) 07/25/2020            Recent Results (from the past 240 hour(s))  Resp Panel by RT-PCR (Flu A&B, Covid) Nasopharyngeal Swab     Status: None   Collection Time: 07/21/21  3:21 PM   Specimen: Nasopharyngeal Swab; Nasopharyngeal(NP) swabs in vial transport medium  Result Value Ref Range Status   SARS Coronavirus 2 by RT PCR NEGATIVE NEGATIVE Final    Comment: (NOTE) SARS-CoV-2 target nucleic acids are NOT DETECTED.  The SARS-CoV-2 RNA is generally detectable in upper respiratory specimens during the acute phase of infection. The lowest concentration of SARS-CoV-2 viral copies this assay can detect is 138 copies/mL. A negative result does not preclude SARS-Cov-2 infection and should not be used as the sole basis for treatment or other patient management decisions. A negative result may occur with  improper specimen collection/handling, submission of specimen other than nasopharyngeal swab, presence of viral mutation(s) within the areas targeted by this assay, and inadequate number of viral copies(<138 copies/mL). A negative result must be combined with clinical observations, patient history, and epidemiological information. The expected result is Negative.  Fact Sheet for Patients:  EntrepreneurPulse.com.au  Fact Sheet for Healthcare Providers:  IncredibleEmployment.be  This test is no t yet approved or cleared by the [place] FDA and  has been authorized for detection and/or diagnosis of SARS-CoV-2 by FDA under an Emergency Use Authorization (EUA). This EUA will remain  in effect (meaning this test can be used) for the duration of the COVID-19 declaration under Section 564(b)(1) of the Act, 21 U.S.C.section 360bbb-3(b)(1), unless the authorization is terminated  or revoked sooner.       Influenza A by PCR NEGATIVE NEGATIVE Final   Influenza B by PCR NEGATIVE NEGATIVE Final    Comment: (NOTE) The Xpert Xpress  SARS-CoV-2/FLU/RSV plus assay is intended as an aid in the diagnosis of influenza from Nasopharyngeal swab specimens and should not be used as a sole basis for treatment. Nasal washings and aspirates are unacceptable for Xpert Xpress SARS-CoV-2/FLU/RSV testing.  Fact Sheet for Patients: EntrepreneurPulse.com.au  Fact Sheet for Healthcare Providers: IncredibleEmployment.be  This test is not yet approved or cleared by the [place] FDA and has been authorized for detection and/or diagnosis of SARS-CoV-2 by FDA under an Emergency Use Authorization (EUA). This EUA will remain in effect (meaning this test can be used) for the duration of the COVID-19 declaration under Section 564(b)(1) of the Act, 21 U.S.C. section 360bbb-3(b)(1), unless the authorization is terminated or revoked.  Performed at [facility], [place] [address] [place], [place] [address]   Surgical pcr screen     Status: Abnormal   Collection Time: 07/22/21 11:53 PM   Specimen: Nasal Mucosa; Nasal Swab  Result Value Ref Range Status   MRSA, PCR NEGATIVE NEGATIVE Final   Staphylococcus aureus POSITIVE (A) NEGATIVE Final    Comment: (NOTE) The Xpert SA Assay (FDA approved for NASAL specimens in patients 66 years of age and older), is one component of a comprehensive surveillance program. It is not intended to diagnose infection nor to guide or monitor treatment. Performed at [facility], [place] [address], [place], [place] [address]     [facility] If 7PM-7AM, please contact night-coverage at [email or web address], Office  [phone]   07/29/2021, 2:58 PM  LOS: 8 days

## 2021-07-29 NOTE — Plan of Care (Signed)
°  Problem: Education: Goal: Knowledge of General Education information will improve Description: Including pain rating scale, medication(s)/side effects and non-pharmacologic comfort measures 07/29/2021 1931 by Morene Rankins, LPN Outcome: Progressing 07/29/2021 1931 by Morene Rankins, LPN Outcome: Progressing   Problem: Health Behavior/Discharge Planning: Goal: Ability to manage health-related needs will improve 07/29/2021 1931 by Morene Rankins, LPN Outcome: Progressing 07/29/2021 1931 by Morene Rankins, LPN Outcome: Progressing   Problem: Clinical Measurements: Goal: Ability to maintain clinical measurements within normal limits will improve 07/29/2021 1931 by Morene Rankins, LPN Outcome: Progressing 07/29/2021 1931 by Morene Rankins, LPN Outcome: Progressing Goal: Will remain free from infection 07/29/2021 1931 by Morene Rankins, LPN Outcome: Progressing 07/29/2021 1931 by Morene Rankins, LPN Outcome: Progressing Goal: Diagnostic test results will improve 07/29/2021 1931 by Morene Rankins, LPN Outcome: Progressing 07/29/2021 1931 by Morene Rankins, LPN Outcome: Progressing Goal: Respiratory complications will improve 07/29/2021 1931 by Morene Rankins, LPN Outcome: Progressing 07/29/2021 1931 by Morene Rankins, LPN Outcome: Progressing Goal: Cardiovascular complication will be avoided 07/29/2021 1931 by Morene Rankins, LPN Outcome: Progressing 07/29/2021 1931 by Morene Rankins, LPN Outcome: Progressing   Problem: Activity: Goal: Risk for activity intolerance will decrease 07/29/2021 1931 by Morene Rankins, LPN Outcome: Progressing 07/29/2021 1931 by Morene Rankins, LPN Outcome: Progressing   Problem: Nutrition: Goal: Adequate nutrition will be maintained 07/29/2021 1931 by Morene Rankins, LPN Outcome: Progressing 07/29/2021 1931 by Morene Rankins, LPN Outcome: Progressing   Problem: Coping: Goal: Level of anxiety will  decrease 07/29/2021 1931 by Morene Rankins, LPN Outcome: Progressing 07/29/2021 1931 by Morene Rankins, LPN Outcome: Progressing   Problem: Elimination: Goal: Will not experience complications related to bowel motility 07/29/2021 1931 by Morene Rankins, LPN Outcome: Progressing 07/29/2021 1931 by Morene Rankins, LPN Outcome: Progressing Goal: Will not experience complications related to urinary retention 07/29/2021 1931 by Morene Rankins, LPN Outcome: Progressing 07/29/2021 1931 by Morene Rankins, LPN Outcome: Progressing   Problem: Pain Managment: Goal: General experience of comfort will improve Outcome: Progressing   Problem: Safety: Goal: Ability to remain free from injury will improve Outcome: Progressing   Problem: Skin Integrity: Goal: Risk for impaired skin integrity will decrease Outcome: Progressing   Problem: Safety: Goal: Non-violent Restraint(s) Outcome: Progressing

## 2021-07-29 NOTE — Plan of Care (Signed)

## 2021-07-29 NOTE — Progress Notes (Signed)
Occupational Therapy Treatment Patient Details Name: [name] [name] MRN: [id number] DOB: [birth year] Today's Date: 07/29/2021   History of present illness Pt. is 85 yr old F admitted on 07/21/21 s/p fall resulting in R LE pain with inability to bear weight.  Imaging (+) for R subcapital femoral neck fx. Pt. underwent direct ant THA on 12/16. PMH: arthritis, CHF, CKD, dementia, DM, HTN, sleep apnea, TIA, ESRD on HD   OT comments  Pt making progress with functional goals. Session focused on sit - stand transitions, functional mobility using RW walking to [facility], BSC transfers, toileting tasks and transfers back to recliner. Pt's daughter and son present, supportive and asked questions about having a shower chair at home as well as [facility] therapy services. OT will continue to follow acutely to maximize level of function and safety   Recommendations for follow up therapy are one component of a multi-disciplinary discharge planning process, led by the attending physician.  Recommendations may be updated based on patient status, additional functional criteria and insurance authorization.    Follow Up Recommendations  Home health OT    Assistance Recommended at Discharge Intermittent Supervision/Assistance  Equipment Recommendations  BSC/3in1;Tub/shower seat;Other (comment) (RW)    Recommendations for Other Services      Precautions / Restrictions Precautions Precautions: Fall Restrictions Weight Bearing Restrictions: Yes RLE Weight Bearing: Weight bearing as tolerated       Mobility Bed Mobility               General bed mobility comments: pt in recliner upon arrival    Transfers   Equipment used: Rolling walker (2 wheels) Transfers: Sit to/from Stand Sit to Stand: Min assist           General transfer comment: Min A with sit > stand for safety with hand placement.  Demos good standing balance with use of AD.     Balance Overall balance assessment: Needs  assistance Sitting-balance support: Feet supported;Single extremity supported;No upper extremity supported Sitting balance-Leahy Scale: Fair     Standing balance support: Bilateral upper extremity supported;During functional activity;Single extremity supported Standing balance-Leahy Scale: Poor                             ADL either performed or assessed with clinical judgement   ADL Overall ADL's : Needs assistance/impaired     Grooming: Min guard;Standing;Wash/dry hands;Wash/dry Sports administrator: Minimal assistance;Cueing for safety;Ambulation;Rolling walker (2 wheels);BSC/3in1;Cueing for sequencing   Toileting- Clothing Manipulation and Hygiene: Minimal assistance;Sit to/from stand       Functional mobility during ADLs: Minimal assistance;Cueing for safety;Rolling walker (2 wheels);Cueing for sequencing      Extremity/Trunk Assessment Upper Extremity Assessment Upper Extremity Assessment: Generalized weakness   Lower Extremity Assessment Lower Extremity Assessment: Defer to PT evaluation        Vision Baseline Vision/History: 1 Wears glasses Ability to See in Adequate Light: 0 Adequate Patient Visual Report: No change from baseline     Perception     Praxis      Cognition Arousal/Alertness: Awake/alert Behavior During Therapy: WFL for tasks assessed/performed Overall Cognitive Status: Within Functional Limits for tasks assessed Area of Impairment: Memory;Awareness;Safety/judgement;Attention;Problem solving                     Memory: Decreased short-term memory;Decreased recall of precautions Following Commands: Follows one  step commands inconsistently Safety/Judgement: Decreased awareness of safety;Decreased awareness of deficits   Problem Solving: Slow processing;Decreased initiation;Difficulty sequencing;Requires verbal cues;Requires tactile cues            Exercises     Shoulder Instructions        General Comments      Pertinent Vitals/ Pain       Pain Assessment: Faces Faces Pain Scale: Hurts a little bit Breathing: normal Negative Vocalization: none Facial Expression: facial grimacing Body Language: relaxed Consolability: no need to console PAINAD Score: 2 Pain Location: R hip Pain Descriptors / Indicators: Grimacing;Discomfort Pain Intervention(s): Monitored during session;Repositioned;Premedicated before session  Home Living                                          Prior Functioning/Environment              Frequency  Min 2X/week        Progress Toward Goals  OT Goals(current goals can now be found in the care plan section)  Progress towards OT goals: Progressing toward goals     Plan Discharge plan remains appropriate;Frequency remains appropriate    Co-evaluation                 AM-PAC OT "6 Clicks" Daily Activity     Outcome Measure   Help from another person eating meals?: None Help from another person taking care of personal grooming?: A Little Help from another person toileting, which includes using toliet, bedpan, or urinal?: A Little Help from another person bathing (including washing, rinsing, drying)?: A Little Help from another person to put on and taking off regular upper body clothing?: A Little Help from another person to put on and taking off regular lower body clothing?: A Little 6 Click Score: 19    End of Session Equipment Utilized During Treatment: Rolling walker (2 wheels);Gait belt;Other (comment) (BSC)  OT Visit Diagnosis: Unsteadiness on feet (R26.81);Muscle weakness (generalized) (M62.81);Other symptoms and signs involving cognitive function;Other abnormalities of gait and mobility (R26.89);History of falling (Z91.81)   Activity Tolerance Patient limited by fatigue   Patient Left in bed;with call bell/phone within reach;in chair;with chair alarm set;with family/visitor present   Nurse  Communication          Time: [phone] OT Time Calculation (min): 24 min  Charges: OT General Charges $OT Visit: 1 Visit OT Treatments $Self Care/Home Management : 8-22 mins $Therapeutic Activity: 8-22 mins    Britt Bottom 07/29/2021, 4:07 PM

## 2021-07-30 LAB — CBC
HCT: 27.5 % — ABNORMAL LOW (ref 36.0–46.0)
HCT: 27.2 % — ABNORMAL LOW (ref 36.0–46.0)
Hemoglobin: 8.8 g/dL — ABNORMAL LOW (ref 12.0–15.0)
Hemoglobin: 8.7 g/dL — ABNORMAL LOW (ref 12.0–15.0)
MCH: 24.4 pg — ABNORMAL LOW (ref 26.0–34.0)
MCH: 24.4 pg — ABNORMAL LOW (ref 26.0–34.0)
MCHC: 32 g/dL (ref 30.0–36.0)
MCHC: 32 g/dL (ref 30.0–36.0)
MCV: 76.4 fL — ABNORMAL LOW (ref 80.0–100.0)
MCV: 76.4 fL — ABNORMAL LOW (ref 80.0–100.0)
Platelets: 333 10*3/uL (ref 150–400)
Platelets: 328 10*3/uL (ref 150–400)
RBC: 3.6 MIL/uL — ABNORMAL LOW (ref 3.87–5.11)
RBC: 3.56 MIL/uL — ABNORMAL LOW (ref 3.87–5.11)
RDW: 17.1 % — ABNORMAL HIGH (ref 11.5–15.5)
RDW: 16.9 % — ABNORMAL HIGH (ref 11.5–15.5)
WBC: 11.8 10*3/uL — ABNORMAL HIGH (ref 4.0–10.5)
WBC: 11.3 10*3/uL — ABNORMAL HIGH (ref 4.0–10.5)
nRBC: 0 % (ref 0.0–0.2)
nRBC: 0 % (ref 0.0–0.2)

## 2021-07-30 LAB — BPAM RBC
Blood Product Expiration Date: 202301092359
Blood Product Expiration Date: 202301102359
ISSUE DATE / TIME: 202212191000
Unit Type and Rh: 7300
Unit Type and Rh: 7300

## 2021-07-30 LAB — RENAL FUNCTION PANEL
Albumin: 2.4 g/dL — ABNORMAL LOW (ref 3.5–5.0)
Albumin: 2.4 g/dL — ABNORMAL LOW (ref 3.5–5.0)
Anion gap: 8 (ref 5–15)
Anion gap: 9 (ref 5–15)
BUN: 29 mg/dL — ABNORMAL HIGH (ref 8–23)
BUN: 29 mg/dL — ABNORMAL HIGH (ref 8–23)
CO2: 28 mmol/L (ref 22–32)
CO2: 30 mmol/L (ref 22–32)
Calcium: 7.9 mg/dL — ABNORMAL LOW (ref 8.9–10.3)
Calcium: 8 mg/dL — ABNORMAL LOW (ref 8.9–10.3)
Chloride: 93 mmol/L — ABNORMAL LOW (ref 98–111)
Chloride: 92 mmol/L — ABNORMAL LOW (ref 98–111)
Creatinine, Ser: 4.95 mg/dL — ABNORMAL HIGH (ref 0.44–1.00)
Creatinine, Ser: 5.11 mg/dL — ABNORMAL HIGH (ref 0.44–1.00)
GFR, Estimated: 8 mL/min — ABNORMAL LOW (ref >60)
GFR, Estimated: 8 mL/min — ABNORMAL LOW (ref >60)
Glucose, Bld: 163 mg/dL — ABNORMAL HIGH (ref 70–99)
Glucose, Bld: 150 mg/dL — ABNORMAL HIGH (ref 70–99)
Phosphorus: 2.3 mg/dL — ABNORMAL LOW (ref 2.5–4.6)
Phosphorus: 2.3 mg/dL — ABNORMAL LOW (ref 2.5–4.6)
Potassium: 3.5 mmol/L (ref 3.5–5.1)
Potassium: 3.2 mmol/L — ABNORMAL LOW (ref 3.5–5.1)
Sodium: 129 mmol/L — ABNORMAL LOW (ref 135–145)
Sodium: 131 mmol/L — ABNORMAL LOW (ref 135–145)

## 2021-07-30 LAB — TYPE AND SCREEN
ABO/RH(D): B POS
Antibody Screen: NEGATIVE
Unit division: 0
Unit division: 0

## 2021-07-30 LAB — GLUCOSE, CAPILLARY
Glucose-Capillary: 131 mg/dL — ABNORMAL HIGH (ref 70–99)
Glucose-Capillary: 137 mg/dL — ABNORMAL HIGH (ref 70–99)
Glucose-Capillary: 138 mg/dL — ABNORMAL HIGH (ref 70–99)
Glucose-Capillary: 106 mg/dL — ABNORMAL HIGH (ref 70–99)

## 2021-07-30 MED ORDER — ENSURE ENLIVE PO LIQD
237.0000 mL | Freq: Two times a day (BID) | ORAL | Status: DC
  Administered 2021-07-31: 08:00:00 237 mL via ORAL

## 2021-07-30 MED ORDER — RENA-VITE PO TABS
1.0000 | ORAL_TABLET | Freq: Every day | ORAL | Status: DC
  Administered 2021-07-30: 21:00:00 1 via ORAL
  Filled 2021-07-30: qty 1

## 2021-07-30 MED ORDER — SODIUM CHLORIDE 0.9 % IV SOLN: 100.0000 mL | INTRAVENOUS | Status: DC | PRN

## 2021-07-30 MED ORDER — LIDOCAINE HCL (PF) 1 % IJ SOLN: 5.0000 mL | INTRAMUSCULAR | Status: DC | PRN

## 2021-07-30 MED ORDER — ALTEPLASE 2 MG IJ SOLR: 2.0000 mg | Freq: Once | INTRAMUSCULAR | Status: DC | PRN

## 2021-07-30 MED ORDER — HEPARIN SODIUM (PORCINE) 1000 UNIT/ML DIALYSIS: 1000.0000 [IU] | INTRAMUSCULAR | Status: DC | PRN

## 2021-07-30 MED ORDER — PENTAFLUOROPROP-TETRAFLUOROETH EX AERO: 1.0000 "application " | INHALATION_SPRAY | CUTANEOUS | Status: DC | PRN

## 2021-07-30 MED ORDER — LIDOCAINE-PRILOCAINE 2.5-2.5 % EX CREA: 1.0000 "application " | TOPICAL_CREAM | CUTANEOUS | Status: DC | PRN

## 2021-07-30 NOTE — Progress Notes (Addendum)
Pt unable to be d/c yesterday due to dtr's concerns about transportation to HD. Spoke to RN CM who has spoken to dtr regarding transportation options. Navigator contacted dtr this am to advise dtr that pt is stable for d/c per medical team. Explained to dtr that hospital would like pt to be d/c this am in order for pt to receive HD treatment at out-pt clinic. Advised dtr that pt would need to be at clinic by 10:30. Dtr states that is not possible and that pt needs to receive treatment inpt prior to d/c. Dtr also states that hospital bed needs to be delivered to home prior to pt being d/c. Dtr is unsure when bed to be delivered. RN CM made aware that hospital bed has not been delivered and to investigate. Contacted [facility] to make clinic aware pt will not be able to arrive by 10:30 for treatment today per dtr. Pt can start on Monday. Out-pt HD arrangements noted on AVS and was discussed with dtr yesterday. Clinic will need orders at d/c.   Melven Sartorius Renal Navigator [phone]   Addendum at 4:13 pm: Renal NP has sent orders to out-pt clinic [facility]) for pt to start at clinic on Monday.

## 2021-07-30 NOTE — TOC Progression Note (Addendum)
Transition of Care [facility]) - Progression Note    Patient Details  Name: [name] [name] MRN: [id number] Date of Birth: May 13, 1936  [facility] of [facility]) CM/SW Contact  Sharin Mons, RN Phone Number: 07/30/2021, 3:00 PM  Clinical Narrative:    NCM had lengthy conversation with pt's daughter [name] [name] on yesterday regarding transportation concerns to new outpatient HD center in [place]. States she lives in [place] and pt will recovery @ her residence: [address] [place]  [name] [address]. NCM explained pt can continue with [facility] if pt can be picked up @ pt's place of residence. However, [facility] will not pick pt up in [facility] and take pt to HD center in [place]. NCM explained she setup transportation with other transportation services but will incur out of pocket expense. Daughter wasn't happy. Only seeking free services. NCM made [facility] supervisor aware of case. [name] [name] (Daughter)     [phone]     NCM f/u with [name] [name]/[facility] [phone]) regarding DMEhospital bed. [name] [name] stated they will f/u with daughter [name] [name] and deliver bed today.  TOC team will continue to monitor and assist with needs....  Expected Discharge Plan: [place] Services Barriers to Discharge: Other (must enter comment)  Expected Discharge Plan and Services Expected Discharge Plan: [place]   Discharge Planning Services: CM Consult Post Acute Care Choice: [place] arrangements for the past 2 months: Single Family Home                 DME Arranged: 3-N-1, Hospital bed, Walker rolling DME Agency: [facility] Date DME Agency Contacted: 07/25/21 Time DME Agency Contacted: [phone] Representative spoke with at DME Agency: [place]: PT [place]: [place] Date [place]: 07/24/21 Time [place]: 1631 Representative spoke with at [place]: [place]  ([place]) Interventions    Readmission Risk Interventions No flowsheet data found.

## 2021-07-30 NOTE — Progress Notes (Signed)
[facility]  PROGRESS NOTE  [name] [name] SJG:[id number] DOB: 1936/08/07 DOA: 07/21/2021 PCP: [name], MD   Brief HPI:   85 year old female with past medical history of ESRD on hemodialysis, diabetes mellitus type 2 diet-controlled, essential hypertension, paroxysmal atrial fibrillation on Eliquis, ambulatory dysfunction came in with right hip pain after mechanical fall.  CT of the hip showed nondisplaced impacted subcapital right hip fracture.  Eliquis was on hold.  Orthopedic surgery was consulted.  Patient underwent ORIF.    Subjective   Patient seen and examined, no new complaints.  Awaiting transportation issues for outpatient metastasis to be resolved before discharge   Assessment/Plan:    Right hip fracture -Secondary to mechanical fall -Orthopedic surgery was consulted -Patient underwent right total hip arthroplasty -She received 2 units PRBC after surgery -Physical therapy saw patient and recommended home health PT  Acute blood loss anemia/anemia chronic disease -Patient hemoglobin dropped after surgery, required 3 units PRBC -Eliquis was held -This morning hemoglobin is 8.5 -Eliquis was restarted -Check CBC today  End-stage renal disease -Nephrology was consulted -Patient getting hemodialysis per nephrology  Paroxysmal atrial fibrillation -Continue metoprolol -Eliquis restarted  Hypertension -Continue amlodipine, metoprolol  Diabetes mellitus type 2 -Hemoglobin A1c 6.4 -Continue sliding scale insulin with NovoLog  Moderate dementia -Continue Aricept  Chronic diastolic heart failure -Euvolemic  Acute confusional state -Resolved   Social issues -Patient's daughter says that she cannot take patient to outpatient hemodialysis facility  and that hospital has to provide the transportation.  Case management and renal navigator working with patient's daughter to figure this out.     Medications     sodium chloride   Intravenous  Once   amLODipine  10 mg Oral Daily   apixaban  2.5 mg Oral BID   atorvastatin  20 mg Oral Daily   Chlorhexidine Gluconate Cloth  6 each Topical Daily   docusate sodium  100 mg Oral BID   donepezil  5 mg Oral QHS   feeding supplement  237 mL Oral BID BM   insulin aspart  0-6 Units Subcutaneous TID WC   loratadine  10 mg Oral Daily   melatonin  3 mg Oral QHS   metoprolol tartrate  12.5 mg Oral BID   multivitamin  1 tablet Oral QHS   mupirocin ointment  1 application Nasal BID   senna  1 tablet Oral BID     Data Reviewed:   CBG:  Recent Labs  Lab 07/29/21 1635 07/29/21 2058 07/30/21 0642 07/30/21 0854 07/30/21 1204  GLUCAP 178* 149* 131* 137* 138*    SpO2: 100 % O2 Flow Rate (L/min): 2 L/min    Vitals:   07/29/21 1300 07/29/21 2010 07/30/21 0502 07/30/21 0852  BP: 120/68 137/66 (!) 119/43 (!) 136/57  Pulse: 78 100 85 92  Resp: 18 18 18 15   Temp: 98.4 F (36.9 C) 98 F (36.7 C) 98.5 F (36.9 C) 98.2 F (36.8 C)  TempSrc: Oral Oral Oral Oral  SpO2: 100% 100% 97% 100%  Weight:      Height:        No intake or output data in the 24 hours ending 07/30/21 1518   12/21 1901 - 12/23 0700 In: 240 [P.O.:240] Out: -   Filed Weights   07/26/21 1235 07/28/21 0818 07/28/21 1235  Weight: 52.6 kg 53.8 kg 51.4 kg    Data Reviewed: Basic Metabolic Panel: Recent Labs  Lab 07/25/21 0726 07/28/21 0359 07/29/21 0250 07/30/21 0242 07/30/21 0450  NA 131* 126*  131* 129* 131*  K 4.1 3.7 3.4* 3.5 3.2*  CL 96* 91* 93* 93* 92*  CO2 22 26 29 28 30   GLUCOSE 208* 162* 131* 163* 150*  BUN 53* 38* 20 29* 29*  CREATININE 6.37* 5.21* 3.62* 4.95* 5.11*  CALCIUM 8.2* 8.1* 7.6* 7.9* 8.0*  PHOS  --  2.5  --  2.3* 2.3*   Liver Function Tests: Recent Labs  Lab 07/28/21 0359 07/30/21 0242 07/30/21 0450  ALBUMIN 2.4* 2.4* 2.4*   No results for input(s): LIPASE, AMYLASE in the last 168 hours. No results for input(s): AMMONIA in the last 168 hours. CBC: Recent Labs  Lab  07/26/21 0326 07/26/21 1905 07/28/21 0750 07/28/21 0910 07/29/21 1623 07/30/21 0242 07/30/21 0450  WBC 9.8  --  11.9*  --  11.6* 11.8* 11.3*  HGB 6.3*   < > 9.3* 8.5* 9.9* 8.8* 8.7*  HCT 19.3*   < > 28.5* 26.3* 31.0* 27.5* 27.2*  MCV 73.7*  --  74.8*  --  76.7* 76.4* 76.4*  PLT 267  --  324  --  351 333 328   < > = values in this interval not displayed.   Cardiac Enzymes: No results for input(s): CKTOTAL, CKMB, CKMBINDEX, TROPONINI in the last 168 hours. BNP (last 3 results) Recent Labs    07/31/20 1821  BNP 113.0*    ProBNP (last 3 results) No results for input(s): PROBNP in the last 8760 hours.  CBG: Recent Labs  Lab 07/29/21 1635 07/29/21 2058 07/30/21 0642 07/30/21 0854 07/30/21 1204  GLUCAP 178* 149* 131* 137* 138*       Radiology Reports  No results found.     Antibiotics: Anti-infectives (From admission, onward)    Start     Dose/Rate Route Frequency Ordered Stop   07/23/21 2130  vancomycin (VANCOCIN) IVPB 1000 mg/200 mL premix  Status:  Discontinued        1,000 mg 200 mL/hr over 60 Minutes Intravenous Every 12 hours 07/23/21 2032 07/23/21 2037   07/23/21 0600  vancomycin (VANCOCIN) IVPB 1000 mg/200 mL premix        1,000 mg 200 mL/hr over 60 Minutes Intravenous On call to O.R. 07/22/21 1914 07/23/21 1553         DVT prophylaxis: Apixaban on hold due to drop in hemoglobin  Code Status: Full code  Family Communication: No family at bedside   Consultants: Nephrology Orthopedics  Procedures:     Objective    Physical Examination:  General-appears in no acute distress Heart-S1-S2, regular, no murmur auscultated Lungs-clear to auscultation bilaterally, no wheezing or crackles auscultated Abdomen-soft, nontender, no organomegaly Extremities-no edema in the lower extremities Neuro-alert, oriented x3, no focal deficit noted  Status is: Inpatient  Dispo: The patient is from: Home              Anticipated d/c is to: Home               Anticipated d/c date is: 07/30/2021              Patient currently stable for discharge  Barrier to discharge-awaiting outpatient hemodialysis set up  COVID-19 Labs  No results for input(s): DDIMER, FERRITIN, LDH, CRP in the last 72 hours.  Lab Results  Component Value Date   SARSCOV2NAA NEGATIVE 07/21/2021   SARSCOV2NAA NEGATIVE 12/08/2020   SARSCOV2NAA POSITIVE (A) 07/25/2020            Recent Results (from the past 240 hour(s))  Resp Panel by RT-PCR (Flu  A&B, Covid) Nasopharyngeal Swab     Status: None   Collection Time: 07/21/21  3:21 PM   Specimen: Nasopharyngeal Swab; Nasopharyngeal(NP) swabs in vial transport medium  Result Value Ref Range Status   SARS Coronavirus 2 by RT PCR NEGATIVE NEGATIVE Final    Comment: (NOTE) SARS-CoV-2 target nucleic acids are NOT DETECTED.  The SARS-CoV-2 RNA is generally detectable in upper respiratory specimens during the acute phase of infection. The lowest concentration of SARS-CoV-2 viral copies this assay can detect is 138 copies/mL. A negative result does not preclude SARS-Cov-2 infection and should not be used as the sole basis for treatment or other patient management decisions. A negative result may occur with  improper specimen collection/handling, submission of specimen other than nasopharyngeal swab, presence of viral mutation(s) within the areas targeted by this assay, and inadequate number of viral copies(<138 copies/mL). A negative result must be combined with clinical observations, patient history, and epidemiological information. The expected result is Negative.  Fact Sheet for Patients:  EntrepreneurPulse.com.au  Fact Sheet for Healthcare Providers:  IncredibleEmployment.be  This test is no t yet approved or cleared by the [place] FDA and  has been authorized for detection and/or diagnosis of SARS-CoV-2 by FDA under an Emergency Use Authorization (EUA). This  EUA will remain  in effect (meaning this test can be used) for the duration of the COVID-19 declaration under Section 564(b)(1) of the Act, 21 U.S.C.section 360bbb-3(b)(1), unless the authorization is terminated  or revoked sooner.       Influenza A by PCR NEGATIVE NEGATIVE Final   Influenza B by PCR NEGATIVE NEGATIVE Final    Comment: (NOTE) The Xpert Xpress SARS-CoV-2/FLU/RSV plus assay is intended as an aid in the diagnosis of influenza from Nasopharyngeal swab specimens and should not be used as a sole basis for treatment. Nasal washings and aspirates are unacceptable for Xpert Xpress SARS-CoV-2/FLU/RSV testing.  Fact Sheet for Patients: EntrepreneurPulse.com.au  Fact Sheet for Healthcare Providers: IncredibleEmployment.be  This test is not yet approved or cleared by the [place] FDA and has been authorized for detection and/or diagnosis of SARS-CoV-2 by FDA under an Emergency Use Authorization (EUA). This EUA will remain in effect (meaning this test can be used) for the duration of the COVID-19 declaration under Section 564(b)(1) of the Act, 21 U.S.C. section 360bbb-3(b)(1), unless the authorization is terminated or revoked.  Performed at [facility], [place] [address], [place], [place] [address]   Surgical pcr screen     Status: Abnormal   Collection Time: 07/22/21 11:53 PM   Specimen: Nasal Mucosa; Nasal Swab  Result Value Ref Range Status   MRSA, PCR NEGATIVE NEGATIVE Final   Staphylococcus aureus POSITIVE (A) NEGATIVE Final    Comment: (NOTE) The Xpert SA Assay (FDA approved for NASAL specimens in patients 41 years of age and older), is one component of a comprehensive surveillance program. It is not intended to diagnose infection nor to guide or monitor treatment. Performed at [facility], [place] [address], [place], [place] [address]     [facility] If 7PM-7AM, please contact  night-coverage at [email or web address], Office  [phone]   07/30/2021, 3:18 PM  LOS: 9 days

## 2021-07-30 NOTE — Progress Notes (Signed)
Patient ID: Dyneshia Baccam Cottier, female   DOB: [birth year], 85 y.o.   MRN: [id number] S: No events overnight. O:BP (!) 136/57 (BP Location: Right Arm)    Pulse 92    Temp 98.2 F (36.8 C) (Oral)    Resp 15    Ht 5' (1.524 m)    Wt 51.4 kg    SpO2 100%    BMI 22.13 kg/m  No intake or output data in the 24 hours ending 07/30/21 0914  Intake/Output: I/O last 3 completed shifts: In: 240 [P.O.:240] Out: -   Intake/Output this shift:  No intake/output data recorded. Weight change:  Gen: NAD CVS: RRR Resp:CTA Abd: +BS, soft, Nt/ND Ext: no edema, LUE AVG +T/B  Recent Labs  Lab 07/24/21 0254 07/25/21 0726 07/28/21 0359 07/29/21 0250 07/30/21 0242 07/30/21 0450  NA 133* 131* 126* 131* 129* 131*  K 4.7 4.1 3.7 3.4* 3.5 3.2*  CL 99 96* 91* 93* 93* 92*  CO2 21* 22 26 29 28 30   GLUCOSE 159* 208* 162* 131* 163* 150*  BUN 30* 53* 38* 20 29* 29*  CREATININE 4.49* 6.37* 5.21* 3.62* 4.95* 5.11*  ALBUMIN  --   --  2.4*  --  2.4* 2.4*  CALCIUM 8.1* 8.2* 8.1* 7.6* 7.9* 8.0*  PHOS  --   --  2.5  --  2.3* 2.3*   Liver Function Tests: Recent Labs  Lab 07/28/21 0359 07/30/21 0242 07/30/21 0450  ALBUMIN 2.4* 2.4* 2.4*   No results for input(s): LIPASE, AMYLASE in the last 168 hours. No results for input(s): AMMONIA in the last 168 hours. CBC: Recent Labs  Lab 07/26/21 0326 07/26/21 1905 07/28/21 0750 07/28/21 0910 07/29/21 1623 07/30/21 0242 07/30/21 0450  WBC 9.8  --  11.9*  --  11.6* 11.8* 11.3*  HGB 6.3*   < > 9.3*   < > 9.9* 8.8* 8.7*  HCT 19.3*   < > 28.5*   < > 31.0* 27.5* 27.2*  MCV 73.7*  --  74.8*  --  76.7* 76.4* 76.4*  PLT 267  --  324  --  351 333 328   < > = values in this interval not displayed.   Cardiac Enzymes: No results for input(s): CKTOTAL, CKMB, CKMBINDEX, TROPONINI in the last 168 hours. CBG: Recent Labs  Lab 07/29/21 1133 07/29/21 1635 07/29/21 2058 07/30/21 0642 07/30/21 0854  GLUCAP 89 178* 149* 131* 137*    Iron Studies: No results for  input(s): IRON, TIBC, TRANSFERRIN, FERRITIN in the last 72 hours. Studies/Results: No results found.  sodium chloride   Intravenous Once   amLODipine  10 mg Oral Daily   apixaban  2.5 mg Oral BID   atorvastatin  20 mg Oral Daily   Chlorhexidine Gluconate Cloth  6 each Topical Daily   docusate sodium  100 mg Oral BID   donepezil  5 mg Oral QHS   insulin aspart  0-6 Units Subcutaneous TID WC   lanthanum  1,000 mg Oral BID WC   loratadine  10 mg Oral Daily   melatonin  3 mg Oral QHS   metoprolol tartrate  12.5 mg Oral BID   mupirocin ointment  1 application Nasal BID   senna  1 tablet Oral BID    BMET    Component Value Date/Time   NA 131 (L) 07/30/2021 0450   NA 138 04/01/2021 1521   K 3.2 (L) 07/30/2021 0450   CL 92 (L) 07/30/2021 0450   CO2 30 07/30/2021 0450  GLUCOSE 150 (H) 07/30/2021 0450   BUN 29 (H) 07/30/2021 0450   BUN 21 04/01/2021 1521   CREATININE 5.11 (H) 07/30/2021 0450   CALCIUM 8.0 (L) 07/30/2021 0450   CALCIUM 8.8 01/15/2018 1005   GFRNONAA 8 (L) 07/30/2021 0450   GFRAA 18 (L) 05/05/2020 1237   CBC    Component Value Date/Time   WBC 11.3 (H) 07/30/2021 0450   RBC 3.56 (L) 07/30/2021 0450   HGB 8.7 (L) 07/30/2021 0450   HGB 10.8 (L) 04/01/2021 1521   HCT 27.2 (L) 07/30/2021 0450   HCT 36.5 04/01/2021 1521   PLT 328 07/30/2021 0450   PLT 205 04/01/2021 1521   MCV 76.4 (L) 07/30/2021 0450   MCV 79 04/01/2021 1521   MCH 24.4 (L) 07/30/2021 0450   MCHC 32.0 07/30/2021 0450   RDW 16.9 (H) 07/30/2021 0450   RDW 19.8 (H) 04/01/2021 1521   LYMPHSABS 1.0 07/21/2021 1454   LYMPHSABS 1.8 04/01/2021 1521   MONOABS 0.6 07/21/2021 1454   EOSABS 0.0 07/21/2021 1454   EOSABS 0.1 04/01/2021 1521   BASOSABS 0.0 07/21/2021 1454   BASOSABS 0.0 04/01/2021 1521    Dialysis Orders: [facility] M[facility] 3h 45 min 55kg 400/1.5A 2K/2.5Ca  AVG No heparin  Mircera 75 q 4ks (last 12/12) No VDRA    Assessment/Plan: Right hip fx --s/p total hip replacement 12/16. Per  orthopedics.  ESRD -  HD MWF. Continue on MWF schedule. No heparin  Hypertension/volume  - Blood pressures controlled. No volume excess. At dry weight. Uf'ing as tolerated Anemia  - S/p total 3 units PRBCs for Hgb 6.3. Likely post-op blood loss. Hgb now 8.7 this AM. Recent ESA dose as outpatient. Follow trends.  Metabolic bone disease -  Ca ok. No VDRA. Continue home binders. Phos low, d/c binders Nutrition - Renal diet/fluid restriction Dementia - appears to be at baseline Dispo: per primary service   Gean Quint, MD [place]

## 2021-07-30 NOTE — Progress Notes (Signed)
Physical Therapy Treatment Patient Details Name: [name] [name] MRN: [id number] DOB: [birth year] Today's Date: 07/30/2021   History of Present Illness 85 yr old F admitted on 07/21/21 s/p fall resulting in R LE pain with inability to bear weight.  Imaging (+) for R subcapital femoral neck fx. Pt. underwent direct ant THA on 12/16. PMH: arthritis, CHF, CKD, dementia, DM, HTN, sleep apnea, TIA, ESRD on HD    PT Comments    Pt was tired today, going to HD later and agreeing to work with PT beforehand.  Pt is recommended to go home with family to help her, although today she is demonstrating a struggle to sequence and plan movement.  Follow up with her to work on more repetitive standing, to work on quality of gait to reduce her fall risk.  May need to follow up with outpatient therapy after initially going home with HHPT.  PT would also consider her dc to SNF if family were interested, but they are committed to home. Encouraging OOB, and pt is positioned with safety equipment in place.    Recommendations for follow up therapy are one component of a multi-disciplinary discharge planning process, led by the attending physician.  Recommendations may be updated based on patient status, additional functional criteria and insurance authorization.  Follow Up Recommendations  Home health PT     Assistance Recommended at Discharge Frequent or constant Supervision/Assistance  Equipment Recommendations  Rolling walker (2 wheels);BSC/3in1    Recommendations for Other Services       Precautions / Restrictions Precautions Precautions: Fall Precaution Comments: pt is slow to follow instructions, low recall Restrictions Weight Bearing Restrictions: Yes RLE Weight Bearing: Weight bearing as tolerated     Mobility  Bed Mobility Overal bed mobility: Needs Assistance Bed Mobility: Supine to Sit     Supine to sit: Min assist          Transfers Overall transfer level: Needs  assistance Equipment used: Rolling walker (2 wheels) Transfers: Sit to/from Stand Sit to Stand: Min assist           General transfer comment: dense cues for hand placement    Ambulation/Gait Ambulation/Gait assistance: Min assist Gait Distance (Feet): 16 Feet Assistive device: Rolling walker (2 wheels) Gait Pattern/deviations: Step-to pattern;Step-through pattern;Decreased stride length Gait velocity: reduced Gait velocity interpretation: <1.31 ft/sec, indicative of household ambulator Pre-gait activities: standing balance correction wtih walker General Gait Details: balance is reduced, has limited ability to control her walker and steps without reminders continually   Stairs             Wheelchair Mobility    Modified Rankin (Stroke Patients Only)       Balance Overall balance assessment: Needs assistance Sitting-balance support: Feet supported Sitting balance-Leahy Scale: Fair     Standing balance support: Bilateral upper extremity supported;During functional activity Standing balance-Leahy Scale: Poor                              Cognition Arousal/Alertness: Awake/alert Behavior During Therapy: WFL for tasks assessed/performed Overall Cognitive Status: No family/caregiver present to determine baseline cognitive functioning Area of Impairment: Problem solving;Following commands;Safety/judgement                     Memory: Decreased short-term memory Following Commands: Follows one step commands inconsistently;Follows one step commands with increased time Safety/Judgement: Decreased awareness of safety;Decreased awareness of deficits Awareness: Intellectual Problem Solving: Slow processing;Requires verbal cues  General Comments: pt was noted to need repetitive cues for gait, continually stopping and asking what to do        Exercises Total Joint Exercises Ankle Circles/Pumps: AAROM;5 reps Heel Slides: AROM;10 reps Hip  ABduction/ADduction: AROM;10 reps Mennella Arc Quad: AROM;10 reps    General Comments General comments (skin integrity, edema, etc.): pt was assisted to gain control of her balance, and then to initiate steps with surgical leg.  Pt is weak, confused but willing to try      Pertinent Vitals/Pain Pain Assessment: Faces Faces Pain Scale: Hurts little more Breathing: normal Negative Vocalization: none Facial Expression: smiling or inexpressive Body Language: relaxed    Home Living                          Prior Function            PT Goals (current goals can now be found in the care plan section) Acute Rehab PT Goals Patient Stated Goal: not stated Progress towards PT goals: PT to reassess next treatment    Frequency    Min 5X/week      PT Plan Current plan remains appropriate    Co-evaluation              AM-PAC PT "6 Clicks" Mobility   Outcome Measure  Help needed turning from your back to your side while in a flat bed without using bedrails?: A Little Help needed moving from lying on your back to sitting on the side of a flat bed without using bedrails?: A Little Help needed moving to and from a bed to a chair (including a wheelchair)?: A Little Help needed standing up from a chair using your arms (e.g., wheelchair or bedside chair)?: A Little Help needed to walk in hospital room?: A Little Help needed climbing 3-5 steps with a railing? : A Lot 6 Click Score: 17    End of Session Equipment Utilized During Treatment: Gait belt Activity Tolerance: Patient limited by fatigue Patient left: with call bell/phone within reach;with family/visitor present;in chair;with chair alarm set Nurse Communication: Mobility status PT Visit Diagnosis: Other abnormalities of gait and mobility (R26.89);Unsteadiness on feet (R26.81);History of falling (Z91.81);Muscle weakness (generalized) (M62.81);Pain Pain - Right/Left: Right Pain - part of body: Hip     Time:  1138-1203 PT Time Calculation (min) (ACUTE ONLY): 25 min  Charges:  $Gait Training: 8-22 mins $Therapeutic Exercise: 8-22 mins               Ramond Dial 07/30/2021, 5:59 PM  Mee Hives, PT PhD Acute Rehab Dept. Number: [place] [facility] [place]

## 2021-07-30 NOTE — Plan of Care (Signed)

## 2021-07-30 NOTE — Progress Notes (Signed)
Nutrition Follow-up  DOCUMENTATION CODES:   Not applicable  INTERVENTION:  -Ensure Enlive po BID, each supplement provides 350 kcal and 20 grams of protein -renal mvi daily  NUTRITION DIAGNOSIS:   Increased nutrient needs related to chronic illness, post-op healing (ESRD on HD) as evidenced by estimated needs.  ongoing  GOAL:   Patient will meet greater than or equal to 90% of their needs  progressing  MONITOR:   Diet advancement, PO intake, Supplement acceptance, Labs, Weight trends, Skin, I & O's  REASON FOR ASSESSMENT:   Consult Hip fracture protocol  ASSESSMENT:   85 yo female with a PMH of dementia, ESRD on HD, T2DM (controlled), essential HTN, paroxysmal atrial fibrillation, and ambulatory dysfunction comes in with right hip pain after mechanical fall. Admitted with R hip fracture.  12/16 - s/p ORIF   Per MD, pt is medically stable for discharge and was supposed to discharge yesterday, but pt was unable to due to her daughter's concerns regarding transportation to HD and due to the hospital bed pt/family waiting on having not been delivered yet. Pt now pending discharge again.   Pt unavailable at time of RD visit x2 attempts. Pt has not had adequate PO intake since last RD assessment. PO Intake: 0-60% x last 8 recorded meals (27.5% avg meal intake). Will provide pt with ONS in hopes of increasing kcal/protein intake.   No UOP documented x24 hours I/O: -1335ml since admit  Pt's last HD was 12/21 w/ 1L net UF Pre-HD wt 53.8 kg Post-HD wt 51.4 kg Will need new EDW at discharge as pt is lower than EDW of 55kg  Current weight: 51.4 kg Admit weight: 54.6 kg   Medications:  sodium chloride   Intravenous Once   amLODipine  10 mg Oral Daily   apixaban  2.5 mg Oral BID   atorvastatin  20 mg Oral Daily   Chlorhexidine Gluconate Cloth  6 each Topical Daily   docusate sodium  100 mg Oral BID   donepezil  5 mg Oral QHS   insulin aspart  0-6 Units Subcutaneous TID  WC   loratadine  10 mg Oral Daily   melatonin  3 mg Oral QHS   metoprolol tartrate  12.5 mg Oral BID   mupirocin ointment  1 application Nasal BID   senna  1 tablet Oral BID   Labs: Recent Labs  Lab 07/28/21 0359 07/29/21 0250 07/30/21 0242 07/30/21 0450  NA 126* 131* 129* 131*  K 3.7 3.4* 3.5 3.2*  CL 91* 93* 93* 92*  CO2 26 29 28 30   BUN 38* 20 29* 29*  CREATININE 5.21* 3.62* 4.95* 5.11*  CALCIUM 8.1* 7.6* 7.9* 8.0*  PHOS 2.5  --  2.3* 2.3*  GLUCOSE 162* 131* 163* 150*  CBGs: 89-178 x24 hours    NUTRITION - FOCUSED PHYSICAL EXAM: Unable to complete at this time. Will attempt at follow-up  Diet Order:   Diet Order             Diet renal with fluid restriction Fluid restriction: 1200 mL Fluid; Room service appropriate? Yes; Fluid consistency: Thin  Diet effective now                   EDUCATION NEEDS:   Not appropriate for education at this time  Skin:  Skin Assessment: Skin Integrity Issues: Skin Integrity Issues:: Incisions Incisions: R hip  Last BM:  12/21  Height:   Ht Readings from Last 1 Encounters:  07/22/21 5' (1.524 m)  Weight:   Wt Readings from Last 1 Encounters:  07/28/21 51.4 kg     BMI:  Body mass index is 22.13 kg/m.  Estimated Nutritional Needs:   Kcal:  1850-2050  Protein:  80-95 grams  Fluid:  >1.85 L     Theone Stanley., MS, RD, LDN (she/her/hers) RD pager number and weekend/on-call pager number located in [place].

## 2021-07-31 DIAGNOSIS — S72001A Fracture of unspecified part of neck of right femur, initial encounter for closed fracture: Secondary | ICD-10-CM | POA: Diagnosis not present

## 2021-07-31 LAB — GLUCOSE, CAPILLARY
Glucose-Capillary: 91 mg/dL (ref 70–99)
Glucose-Capillary: 121 mg/dL — ABNORMAL HIGH (ref 70–99)

## 2021-07-31 MED ORDER — LEVEMIR FLEXTOUCH 100 UNIT/ML ~~LOC~~ SOPN: 8.0000 [IU] | PEN_INJECTOR | Freq: Every day | SUBCUTANEOUS | Status: DC

## 2021-07-31 MED ORDER — POLYETHYLENE GLYCOL 3350 17 G PO PACK: 17.0000 g | PACK | Freq: Every day | ORAL | 0 refills | Status: DC | PRN

## 2021-07-31 NOTE — Progress Notes (Signed)
Patient ID: [name] [name], female   DOB: [birth year], 85 y.o.   MRN: [id number] S: No events overnight. Tolerated hd yesterday net uf 2L. Open for discharge to home O:BP (!) 144/61 (BP Location: Right Arm)    Pulse (!) 104    Temp 98.4 F (36.9 C) (Oral)    Resp 18    Ht 5' (1.524 m)    Wt 51.9 kg    SpO2 100%    BMI 22.35 kg/m   Intake/Output Summary (Last 24 hours) at 07/31/2021 1139 Last data filed at 07/30/2021 1930 Gross per 24 hour  Intake 480 ml  Output 2600 ml  Net -2120 ml    Intake/Output: I/O last 3 completed shifts: In: 840 [P.O.:840] Out: 3000 [Urine:1000; Other:2000]  Intake/Output this shift:  No intake/output data recorded. Weight change:  Gen: NAD CVS: RRR Resp:CTA Abd: +BS, soft, Nt/ND Ext: no edema, LUE AVG +T/B  Recent Labs  Lab 07/25/21 0726 07/28/21 0359 07/29/21 0250 07/30/21 0242 07/30/21 0450  NA 131* 126* 131* 129* 131*  K 4.1 3.7 3.4* 3.5 3.2*  CL 96* 91* 93* 93* 92*  CO2 22 26 29 28 30   GLUCOSE 208* 162* 131* 163* 150*  BUN 53* 38* 20 29* 29*  CREATININE 6.37* 5.21* 3.62* 4.95* 5.11*  ALBUMIN  --  2.4*  --  2.4* 2.4*  CALCIUM 8.2* 8.1* 7.6* 7.9* 8.0*  PHOS  --  2.5  --  2.3* 2.3*   Liver Function Tests: Recent Labs  Lab 07/28/21 0359 07/30/21 0242 07/30/21 0450  ALBUMIN 2.4* 2.4* 2.4*   No results for input(s): LIPASE, AMYLASE in the last 168 hours. No results for input(s): AMMONIA in the last 168 hours. CBC: Recent Labs  Lab 07/26/21 0326 07/26/21 1905 07/28/21 0750 07/28/21 0910 07/29/21 1623 07/30/21 0242 07/30/21 0450  WBC 9.8  --  11.9*  --  11.6* 11.8* 11.3*  HGB 6.3*   < > 9.3*   < > 9.9* 8.8* 8.7*  HCT 19.3*   < > 28.5*   < > 31.0* 27.5* 27.2*  MCV 73.7*  --  74.8*  --  76.7* 76.4* 76.4*  PLT 267  --  324  --  351 333 328   < > = values in this interval not displayed.   Cardiac Enzymes: No results for input(s): CKTOTAL, CKMB, CKMBINDEX, TROPONINI in the last 168 hours. CBG: Recent Labs  Lab  07/30/21 0642 07/30/21 0854 07/30/21 1204 07/30/21 2031 07/31/21 0802  GLUCAP 131* 137* 138* 106* 91    Iron Studies: No results for input(s): IRON, TIBC, TRANSFERRIN, FERRITIN in the last 72 hours. Studies/Results: No results found.  sodium chloride   Intravenous Once   amLODipine  10 mg Oral Daily   apixaban  2.5 mg Oral BID   atorvastatin  20 mg Oral Daily   Chlorhexidine Gluconate Cloth  6 each Topical Daily   docusate sodium  100 mg Oral BID   donepezil  5 mg Oral QHS   feeding supplement  237 mL Oral BID BM   insulin aspart  0-6 Units Subcutaneous TID WC   loratadine  10 mg Oral Daily   melatonin  3 mg Oral QHS   metoprolol tartrate  12.5 mg Oral BID   multivitamin  1 tablet Oral QHS   mupirocin ointment  1 application Nasal BID   senna  1 tablet Oral BID    BMET    Component Value Date/Time   NA 131 (L) 07/30/2021 0450  NA 138 04/01/2021 1521   K 3.2 (L) 07/30/2021 0450   CL 92 (L) 07/30/2021 0450   CO2 30 07/30/2021 0450   GLUCOSE 150 (H) 07/30/2021 0450   BUN 29 (H) 07/30/2021 0450   BUN 21 04/01/2021 1521   CREATININE 5.11 (H) 07/30/2021 0450   CALCIUM 8.0 (L) 07/30/2021 0450   CALCIUM 8.8 01/15/2018 1005   GFRNONAA 8 (L) 07/30/2021 0450   GFRAA 18 (L) 05/05/2020 1237   CBC    Component Value Date/Time   WBC 11.3 (H) 07/30/2021 0450   RBC 3.56 (L) 07/30/2021 0450   HGB 8.7 (L) 07/30/2021 0450   HGB 10.8 (L) 04/01/2021 1521   HCT 27.2 (L) 07/30/2021 0450   HCT 36.5 04/01/2021 1521   PLT 328 07/30/2021 0450   PLT 205 04/01/2021 1521   MCV 76.4 (L) 07/30/2021 0450   MCV 79 04/01/2021 1521   MCH 24.4 (L) 07/30/2021 0450   MCHC 32.0 07/30/2021 0450   RDW 16.9 (H) 07/30/2021 0450   RDW 19.8 (H) 04/01/2021 1521   LYMPHSABS 1.0 07/21/2021 1454   LYMPHSABS 1.8 04/01/2021 1521   MONOABS 0.6 07/21/2021 1454   EOSABS 0.0 07/21/2021 1454   EOSABS 0.1 04/01/2021 1521   BASOSABS 0.0 07/21/2021 1454   BASOSABS 0.0 04/01/2021 1521    Dialysis  Orders: [facility] M[facility] 3h 45 min 55kg 400/1.5A 2K/2.5Ca  AVG No heparin  Mircera 75 q 4ks (last 12/12) No VDRA    Assessment/Plan: Right hip fx --s/p total hip replacement 12/16. Per orthopedics.  ESRD -  HD MWF. Continue on MWF schedule. No heparin  Hypertension/volume  - Blood pressures controlled. No volume excess. At dry weight. Uf'ing as tolerated Anemia  - S/p total 3 units PRBCs for Hgb 6.3. Likely post-op blood loss. Hgb now 8.7 12/23. Recent ESA dose as outpatient. Follow trends.  Metabolic bone disease -  Ca ok. No VDRA. Continue home binders. Phos low, d/c'ed binders Nutrition - Renal diet/fluid restriction Dementia - appears to be at baseline Dispo: per primary service   Gean Quint, MD [place]

## 2021-07-31 NOTE — TOC Transition Note (Addendum)
Transition of Care [facility]) - CM/SW Discharge Note   Patient Details  Name: Timothy Townsel MRN: [id number] Date of Birth: 15-Dec-1935  Transition [facility]) CM/SW Contact:  Bartholomew Crews, RN Phone Number: [phone] 07/31/2021, 9:55 AM   Clinical Narrative:     Spoke with patient's sister, Lannette Donath, on her mobile phone. Discussed MD stating patient medical readiness to transition home. Confirmed with [employer] and Lannette Donath that DME has been delivered to the home. Family to provide dialysis transportation as done prior to hospitalization. Family requesting ambulance transportation home to [address] [place]. Transportation scheduled for 12 pm pick up. Medical transport paperwork completed by CSW. [facility] notified of transition home today. No further TOC needs identified at this time.   Final next level of care: [place] Barriers to Discharge: No Barriers Identified   Patient Goals and CMS Choice Patient states their goals for this hospitalization and ongoing recovery are:: return home with daughter CMS Medicare.gov Compare Post Acute Care list provided to:: Patient Choice offered to / list presented to : Patient, Adult Children  Discharge Placement                       Discharge Plan and Services   Discharge Planning Services: CM Consult Post Acute Care Choice: Home Health          DME Arranged: 3-N-1, Hospital bed, Walker rolling DME Agency: [employer] Date DME Agency Contacted: 07/31/21 Time DME Agency Contacted: [phone] Representative spoke with at DME Agency: [place]: PT, OT [place] Agency: [place] Date [place]: 07/31/21 Time [place]: [phone] Representative spoke with at [place]: [place] ([place]) Interventions     Readmission Risk Interventions No flowsheet data found.

## 2021-07-31 NOTE — Progress Notes (Signed)
Patient and her family are tiered on waiting for PTAR, Pt's daughters decided to take her with them in car. So patient's discharge teaching were given, they verbalized of understanding.

## 2021-07-31 NOTE — Progress Notes (Signed)
Physical Therapy Treatment Patient Details Name: [name] [name] MRN: [id number] DOB: 21-Jul-1936 Today's Date: 07/31/2021   History of Present Illness Pt is an 85 y.o. female admitted 07/21/21 after fall resulting in R LE pain with inability to bear weight.  Imaging (+) for R subcapital femoral neck fx. S/p direct anterior THA on 12/16. PMH includes arthritis, CHF, CKD, dementia, DM, HTN, sleep apnea, TIA, ESRD on HD.   PT Comments    Pt progressing well with mobility; mobilizing with RW and intermittent min guard for balance due to fall risk and instability; pt denies pain. Pt with clear memory deficits, requiring frequent cues to complete task. Preparing for d/c home today, will have necessary assist from family. If to remain admitted, will continue to follow acutely.    Recommendations for follow up therapy are one component of a multi-disciplinary discharge planning process, led by the attending physician.  Recommendations may be updated based on patient status, additional functional criteria and insurance authorization.  Follow Up Recommendations  Home health PT     Assistance Recommended at Discharge Frequent or constant Supervision/Assistance  Equipment Recommendations  Rolling walker (2 wheels);BSC/3in1    Recommendations for Other Services       Precautions / Restrictions Precautions Precautions: Fall Restrictions Weight Bearing Restrictions: Yes RLE Weight Bearing: Weight bearing as tolerated     Mobility  Bed Mobility Overal bed mobility: Needs Assistance Bed Mobility: Supine to Sit     Supine to sit: Min guard     General bed mobility comments: Received sitting in recliner    Transfers Overall transfer level: Needs assistance Equipment used: Rolling walker (2 wheels) Transfers: Sit to/from Stand Sit to Stand: Min guard;Mod assist           General transfer comment: Able to stand from recliner to RW with min guard; additional stand from chair  with no armrests, modA to stabilize RW as pt pulling up on it    Ambulation/Gait Ambulation/Gait assistance: Min guard Gait Distance (Feet): 24 Feet (+ 72) Assistive device: Rolling walker (2 wheels) Gait Pattern/deviations: Step-through pattern;Decreased stride length;Trunk flexed Gait velocity: Decreased     General Gait Details: Slow, mostly steady gait with RW and intermittent min guard for balance; listening to music and pt stopping often to 'dance' requiring frequent cues to continue moving forward; 1x seated rest break to don face mask   Stairs             Wheelchair Mobility    Modified Rankin (Stroke Patients Only)       Balance Overall balance assessment: Needs assistance Sitting-balance support: Feet supported;No upper extremity supported Sitting balance-Leahy Scale: Fair     Standing balance support: Bilateral upper extremity supported;Reliant on assistive device for balance;No upper extremity supported Standing balance-Leahy Scale: Fair Standing balance comment: able to stand to 'dance' without UE support, close min guard for balance; static and dynamic stability improved with RW                            Cognition Arousal/Alertness: Awake/alert Behavior During Therapy: WFL for tasks assessed/performed;Flat affect Overall Cognitive Status: History of cognitive impairments - at baseline                                 General Comments: pt pleasantly confused, pleasant and agreeable; requires frequent reminder of task at hand  Exercises Other Exercises Other Exercises: frequent prolonged bouts of static standing to 'dance' with and without UE support on RW during ambulation - pt demonstrates improved standing tolerance, but very fatigued after walk incorporating this throughout    General Comments General comments (skin integrity, edema, etc.): no dressing wound dry and staples in place      Pertinent Vitals/Pain  Pain Assessment: No/denies pain Pain Intervention(s): Monitored during session    Home Living                          Prior Function            PT Goals (current goals can now be found in the care plan section) Progress towards PT goals: Progressing toward goals    Frequency    Min 5X/week      PT Plan Current plan remains appropriate    Co-evaluation              AM-PAC PT "6 Clicks" Mobility   Outcome Measure  Help needed turning from your back to your side while in a flat bed without using bedrails?: A Little Help needed moving from lying on your back to sitting on the side of a flat bed without using bedrails?: A Little Help needed moving to and from a bed to a chair (including a wheelchair)?: A Little Help needed standing up from a chair using your arms (e.g., wheelchair or bedside chair)?: A Little Help needed to walk in hospital room?: A Little Help needed climbing 3-5 steps with a railing? : A Little 6 Click Score: 18    End of Session Equipment Utilized During Treatment: Gait belt Activity Tolerance: Patient tolerated treatment well Patient left: in chair;with call bell/phone within reach;with chair alarm set Nurse Communication: Mobility status PT Visit Diagnosis: Other abnormalities of gait and mobility (R26.89);Unsteadiness on feet (R26.81);History of falling (Z91.81);Muscle weakness (generalized) (M62.81);Pain Pain - Right/Left: Right     Time: 7017-7939 PT Time Calculation (min) (ACUTE ONLY): 25 min  Charges:  $Gait Training: 8-22 mins $Therapeutic Exercise: 8-22 mins                     Mabeline Caras, PT, DPT Acute Rehabilitation Services  Pager [phone] Office [place] 07/31/2021, 11:41 AM

## 2021-07-31 NOTE — Discharge Summary (Signed)
Physician Discharge Summary  Jerene Yeager Klarich CBJ:[id number] DOB: 30-Oct-1935 DOA: 07/21/2021  PCP: [name], MD  Admit date: 07/21/2021 Discharge date: 07/31/2021  Admitted From: Home Disposition:  Home  Discharge Condition:Stable CODE STATUS:FULL Diet recommendation: Renal diet  Brief/Interim Summary: Patient is a 85 year old female with past medical history of ESRD on hemodialysis, diabetes mellitus type 2 diet-controlled, essential hypertension, paroxysmal atrial fibrillation on Eliquis, ambulatory dysfunction came in with right hip pain after mechanical fall.  CT of the hip showed nondisplaced impacted subcapital right hip fracture.  Orthopedic surgery was consulted.  Patient underwent ORIF.  PT/OT recommended home with the discharge.  She is medically stable for discharge to home.  Following problems were addressed during her hospitalization:   Right hip fracture -Secondary to mechanical fall -Orthopedic surgery was consulted -Patient underwent right total hip arthroplasty -She received 3 units PRBC after surgery -Physical therapy saw patient and recommended home health PT   Acute blood loss anemia/anemia chronic disease -Patient hemoglobin dropped after surgery, required 3 units PRBC -This morning hemoglobin in the range of  8.5 -Eliquis was restarted   End-stage renal disease -Nephrology was following -Patient getting hemodialysis per nephrology   Paroxysmal atrial fibrillation -Continue metoprolol -Eliquis restarted   Hypertension -Continue amlodipine, metoprolol   Diabetes mellitus type 2 -Hemoglobin A1c 6.4 -Continue home lantus ,takes 8 units   Moderate dementia -Continue Aricept -Continue supportive care   Chronic diastolic heart failure -Euvolemic      Discharge Diagnoses:  Principal Problem:   Hip fracture [facility])    Discharge Instructions  Discharge Instructions     Diet - low sodium heart healthy   Complete by: As directed     Discharge instructions   Complete by: As directed    1)please take prescribed medication as instructed 2)Do a CBC test in a week 3)Follow up with orthopedics as an outpatient in 1 week for suture removal.  Name and number of the provider has been attached  Please call for appointment 4)Monitor your sugars at home   Increase activity slowly   Complete by: As directed    No wound care   Complete by: As directed       Allergies as of 07/31/2021       Reactions   Penicillins Swelling, Rash        Medication List     STOP taking these medications    cloNIDine 0.1 MG tablet Commonly known as: CATAPRES   hydrALAZINE 25 MG tablet Commonly known as: APRESOLINE   lanthanum 1000 MG chewable tablet Commonly known as: FOSRENOL   oxyCODONE-acetaminophen 10-325 MG tablet Commonly known as: Percocet       TAKE these medications    amLODipine 10 MG tablet Commonly known as: NORVASC TAKE 1 TABLET(10 MG) BY MOUTH DAILY What changed: See the new instructions.   apixaban 2.5 MG Tabs tablet Commonly known as: Eliquis TAKE 1 TABLET(2.5 MG) BY MOUTH TWICE DAILY What changed:  how much to take how to take this when to take this additional instructions   ascorbic acid 500 MG tablet Commonly known as: VITAMIN C Take 1 tablet (500 mg total) by mouth daily.   atorvastatin 20 MG tablet Commonly known as: LIPITOR TAKE 1 TABLET(20 MG) BY MOUTH DAILY AT 6 PM FOR CHOLESTEROL What changed: See the new instructions.   blood glucose meter kit and supplies Kit Dispense based on patient and insurance preference. Use up to four times daily as directed. (FOR ICD-9 250.00, 250.01). What changed:  how much to take how to take this when to take this   donepezil 5 MG tablet Commonly known as: ARICEPT TAKE 1 TABLET(5 MG) BY MOUTH AT BEDTIME What changed: See the new instructions.   glucose blood test strip Use as directed to check blood sugars 3 times per day dx: e11.65 What  changed:  how much to take how to take this when to take this   HYDROcodone-acetaminophen 5-325 MG tablet Commonly known as: NORCO/VICODIN Take 1-2 tablets by mouth every 4 (four) hours as needed for moderate pain (pain score 4-6).   Levemir FlexTouch 100 UNIT/ML FlexPen Generic drug: insulin detemir Inject 8 Units into the skin at bedtime. Please follow-up with your PCP as an outpatient to check if you continue to need the insulin.  Do not take insulin when your sugar is low.  Monitor your sugars at home What changed: See the new instructions.   loratadine 10 MG tablet Commonly known as: CLARITIN Take 10 mg by mouth daily.   metoprolol tartrate 25 MG tablet Commonly known as: LOPRESSOR TAKE 1/2 TABLET(12.5 MG) BY MOUTH TWICE DAILY What changed: See the new instructions.   polyethylene glycol 17 g packet Commonly known as: MiraLax Take 17 g by mouth daily as needed.   zinc sulfate 220 (50 Zn) MG capsule Take 1 capsule (220 mg total) by mouth daily.               Durable Medical Equipment  (From admission, onward)           Start     Ordered   07/25/21 1131  For home use only DME Walker rolling  Once       Question Answer Comment  Walker: With [place] Wheels   Patient needs a walker to treat with the following condition Decreased functional mobility and endurance      07/25/21 1130   07/25/21 1131  For home use only DME 3 n 1  Once        07/25/21 1130   07/25/21 1130  For home use only DME Hospital bed  Once       Question Answer Comment  Length of Need 6 Months   Patient has (list medical condition): hip fracture; CHF   The above medical condition requires: Patient requires the ability to reposition frequently   Head must be elevated greater than: 30 degrees   Bed type Semi-electric   Support Surface: Gel Overlay      07/25/21 1130            Follow-up Information     [place], Aaron Edelman, MD. Schedule an appointment as soon as possible for a visit  in 2 week(s).   Specialty: Orthopedic Surgery Why: For wound re-check, For suture removal Contact information: [address] [place] [address] [phone]         [place] Follow up.   Specialty: [place] Why: the office will call to schedule home health physical therapy visits Contact information: [place] [phone]         [facility], [facility] Follow up.   Why: Schedule is Monday, Wednesday, Friday. Patient needs to arrive at 10:30 for 11:00 chair time.  Patient can start on  12/26. Contact information: [address] [place] [address] [phone]                Allergies  Allergen Reactions   Penicillins Swelling  and Rash    Consultations: Orthopedics, nephrology   Procedures/Studies: DG Pelvis 1-2 Views  Result Date: 07/21/2021 CLINICAL DATA:  Right hip pain after fall. EXAM: PELVIS - 1-2 VIEW COMPARISON:  None. FINDINGS: Acute minimally impacted right subcapital femoral neck fracture. No dislocation. The pubic symphysis and sacroiliac joints are intact. The hip joint spaces are preserved. Osteopenia. Soft tissues are unremarkable. IMPRESSION: 1. Acute minimally impacted right subcapital femoral neck fracture. Electronically Signed   By: Titus Dubin M.D.   On: 07/21/2021 14:11   CT Head Wo Contrast  Result Date: 07/21/2021 CLINICAL DATA:  Head trauma, fall EXAM: CT HEAD WITHOUT CONTRAST TECHNIQUE: Contiguous axial images were obtained from the base of the skull through the vertex without intravenous contrast. COMPARISON:  CT head examination dated November 19, 2020. FINDINGS: Brain: No evidence of acute infarction, hemorrhage, hydrocephalus, extra-axial collection or mass lesion/mass effect. Mild cerebral atrophy and chronic microvascular ischemic changes of the white matter, unchanged. Hypodensity in the right basal ganglia, which may represent  lacunar infarct or ischemic changes. Vascular: Prominent atherosclerotic calcification of bilateral carotid siphons. Skull: Right high parietal scalp hematoma without evidence of calvarial fracture. Sinuses/Orbits: No acute finding. Other: None. IMPRESSION: 1.  No acute intracranial abnormality. 2. Right high parietal scalp hematoma without evidence of calvarial fracture. Electronically Signed   By: Keane Police D.O.   On: 07/21/2021 14:08   CT Cervical Spine Wo Contrast  Addendum Date: 07/21/2021   ADDENDUM REPORT: 07/21/2021 14:21 ADDENDUM: Following should be added to the impression. There is 1.7 cm low-density nodule in the left lobe of thyroid. When the patient's clinical condition permits, follow-up thyroid sonogram should be considered. Electronically Signed   By: Elmer Picker M.D.   On: 07/21/2021 14:21   Result Date: 07/21/2021 CLINICAL DATA:  Trauma trauma fall EXAM: CT CERVICAL SPINE WITHOUT CONTRAST TECHNIQUE: Multidetector CT imaging of the cervical spine was performed without intravenous contrast. Multiplanar CT image reconstructions were also generated. COMPARISON:  None. FINDINGS: Alignment: Alignment of posterior margins of vertebral bodies is within normal limits. Skull base and vertebrae: No recent fracture is seen. Degenerative changes are noted with bony spurs and facet hypertrophy at multiple levels. Soft tissues and spinal canal: There is mild to moderate spinal stenosis at C5-C6 and C6-C7 levels. Disc levels: There is mild to moderate encroachment of neural foramina from C3-C7 levels. Upper chest: Visualized apical portions of lung fields are unremarkable. Other: There is inhomogeneous attenuation in thyroid. There is 1.7 cm low-density nodule in the left lobe. IMPRESSION: No recent fracture is seen in the cervical spine. Osteopenia. Cervical spondylosis. There is spinal stenosis at C5-C6 and C6-C7 levels. There is encroachment of neural foramina from C3-C7 levels.  Electronically Signed: By: Elmer Picker M.D. On: 07/21/2021 14:13   Pelvis Portable  Result Date: 07/23/2021 CLINICAL DATA:  Status post total right hip replacement. EXAM: PORTABLE PELVIS 1-2 VIEWS COMPARISON:  July 23, 2021 FINDINGS: There is no evidence of pelvic fracture or diastasis. A total right hip replacement is seen without evidence of surrounding lucency to suggest the presence of hardware loosening or infection. No pelvic bone lesions are seen. A mild amount of soft tissue air is seen along the lateral aspect of the right hip. Multiple radiopaque skin staples are also noted within this region. IMPRESSION: Status post total right hip replacement without evidence of hardware complication. Electronically Signed   By: Virgina Norfolk M.D.   On: 07/23/2021 19:07   CT Hip Right Wo Contrast  Result Date: 07/21/2021 CLINICAL DATA:  [place].  Right hip pain. EXAM: CT OF THE RIGHT HIP WITHOUT CONTRAST TECHNIQUE: Multidetector CT imaging of the right hip was performed according to the standard protocol. Multiplanar CT image reconstructions were also generated. COMPARISON:  Radiographs, same date. FINDINGS: There is a nondisplaced and minimally impacted subcapital fracture. The visualized right hemipelvic bony structures are intact. The right SI joint appears normal. No significant intrapelvic abnormalities are identified. IMPRESSION: Nondisplaced and minimally impacted subcapital fracture of the right hip. Electronically Signed   By: Marijo Sanes M.D.   On: 07/21/2021 18:14   DG C-Arm 1-60 Min-No Report  Result Date: 07/23/2021 Fluoroscopy was utilized by the requesting physician.  No radiographic interpretation.   DG C-Arm 1-60 Min-No Report  Result Date: 07/23/2021 Fluoroscopy was utilized by the requesting physician.  No radiographic interpretation.   DG HIP OPERATIVE UNILAT WITH PELVIS RIGHT  Result Date: 07/23/2021 CLINICAL DATA:  Fluoroscopic assistance for right hip  arthroplasty EXAM: OPERATIVE right HIP (WITH PELVIS IF PERFORMED) AP VIEWS TECHNIQUE: Fluoroscopic spot image(s) were submitted for interpretation post-operatively. COMPARISON:  07/21/2021 FINDINGS: In the previous study subcapital neck fracture was seen in the proximal right femur. Fluoroscopic images show right hip arthroplasty. Fluoroscopic time was 12 seconds. Radiation dose is 1.02 mGy. IMPRESSION: Fluoroscopic assistance was provided for right hip arthroplasty. Electronically Signed   By: Elmer Picker M.D.   On: 07/23/2021 18:14   DG FEMUR, MIN 2 VIEWS RIGHT  Result Date: 07/21/2021 CLINICAL DATA:  Fall, right hip pain EXAM: RIGHT FEMUR 2 VIEWS COMPARISON:  None. FINDINGS: Cortical irregularity of the femoral head neck junction concerning for nondisplaced impaction fracture. IMPRESSION: Cortical irregularity of the femoral head and neck junction concerning for nondisplaced impaction fracture. Further evaluation with CT or MRI examination would be helpful. Electronically Signed   By: Keane Police D.O.   On: 07/21/2021 14:15      Subjective: Patient seen and examined at the bedside this morning.  Sitting in the chair.  Hemodynamically stable for discharge today.  I called the daughter and discussed about the discharge planning  Discharge Exam: Vitals:   07/30/21 2030 07/31/21 0754  BP: 120/61 (!) 144/61  Pulse: (!) 104 (!) 104  Resp: 18 18  Temp: 98.6 F (37 C)   SpO2: 100% 100%   Vitals:   07/30/21 1900 07/30/21 1930 07/30/21 2030 07/31/21 0754  BP: (!) 125/59 (!) 117/58 120/61 (!) 144/61  Pulse: (!) 104 99 (!) 104 (!) 104  Resp:  '16 18 18  ' Temp:  98.1 F (36.7 C) 98.6 F (37 C)   TempSrc:  Temporal Oral   SpO2:  95% 100% 100%  Weight:  51.9 kg    Height:        General: Pt is alert, awake, not in acute distress Cardiovascular: RRR, S1/S2 +, no rubs, no gallops Respiratory: CTA bilaterally, no wheezing, no rhonchi Abdominal: Soft, NT, ND, bowel sounds  + Extremities: no edema, no cyanosis, AV fistula on the left upper extremity    The results of significant diagnostics from this hospitalization (including imaging, microbiology, ancillary and laboratory) are listed below for reference.     Microbiology: Recent Results (from the past 240 hour(s))  Resp Panel by RT-PCR (Flu A&B, Covid) Nasopharyngeal Swab     Status: None   Collection Time: 07/21/21  3:21 PM   Specimen: Nasopharyngeal Swab; Nasopharyngeal(NP) swabs in vial transport medium  Result Value Ref Range Status   SARS Coronavirus 2 by RT  PCR NEGATIVE NEGATIVE Final    Comment: (NOTE) SARS-CoV-2 target nucleic acids are NOT DETECTED.  The SARS-CoV-2 RNA is generally detectable in upper respiratory specimens during the acute phase of infection. The lowest concentration of SARS-CoV-2 viral copies this assay can detect is 138 copies/mL. A negative result does not preclude SARS-Cov-2 infection and should not be used as the sole basis for treatment or other patient management decisions. A negative result may occur with  improper specimen collection/handling, submission of specimen other than nasopharyngeal swab, presence of viral mutation(s) within the areas targeted by this assay, and inadequate number of viral copies(<138 copies/mL). A negative result must be combined with clinical observations, patient history, and epidemiological information. The expected result is Negative.  Fact Sheet for Patients:  EntrepreneurPulse.com.au  Fact Sheet for Healthcare Providers:  IncredibleEmployment.be  This test is no t yet approved or cleared by the [place] FDA and  has been authorized for detection and/or diagnosis of SARS-CoV-2 by FDA under an Emergency Use Authorization (EUA). This EUA will remain  in effect (meaning this test can be used) for the duration of the COVID-19 declaration under Section 564(b)(1) of the Act, 21 U.S.C.section  360bbb-3(b)(1), unless the authorization is terminated  or revoked sooner.       Influenza A by PCR NEGATIVE NEGATIVE Final   Influenza B by PCR NEGATIVE NEGATIVE Final    Comment: (NOTE) The Xpert Xpress SARS-CoV-2/FLU/RSV plus assay is intended as an aid in the diagnosis of influenza from Nasopharyngeal swab specimens and should not be used as a sole basis for treatment. Nasal washings and aspirates are unacceptable for Xpert Xpress SARS-CoV-2/FLU/RSV testing.  Fact Sheet for Patients: EntrepreneurPulse.com.au  Fact Sheet for Healthcare Providers: IncredibleEmployment.be  This test is not yet approved or cleared by the [place] FDA and has been authorized for detection and/or diagnosis of SARS-CoV-2 by FDA under an Emergency Use Authorization (EUA). This EUA will remain in effect (meaning this test can be used) for the duration of the COVID-19 declaration under Section 564(b)(1) of the Act, 21 U.S.C. section 360bbb-3(b)(1), unless the authorization is terminated or revoked.  Performed at [facility], [place] [address], [place], [place] [address]   Surgical pcr screen     Status: Abnormal   Collection Time: 07/22/21 11:53 PM   Specimen: Nasal Mucosa; Nasal Swab  Result Value Ref Range Status   MRSA, PCR NEGATIVE NEGATIVE Final   Staphylococcus aureus POSITIVE (A) NEGATIVE Final    Comment: (NOTE) The Xpert SA Assay (FDA approved for NASAL specimens in patients 26 years of age and older), is one component of a comprehensive surveillance program. It is not intended to diagnose infection nor to guide or monitor treatment. Performed at [facility], [place] [address] [place], [place] [address]      Labs: BNP (last 3 results) Recent Labs    07/31/20 1821  BNP 115.7*   Basic Metabolic Panel: Recent Labs  Lab 07/25/21 0726 07/28/21 0359 07/29/21 0250 07/30/21 0242 07/30/21 0450  NA 131* 126* 131* 129* 131*  K 4.1  3.7 3.4* 3.5 3.2*  CL 96* 91* 93* 93* 92*  CO2 '22 26 29 28 30  ' GLUCOSE 208* 162* 131* 163* 150*  BUN 53* 38* 20 29* 29*  CREATININE 6.37* 5.21* 3.62* 4.95* 5.11*  CALCIUM 8.2* 8.1* 7.6* 7.9* 8.0*  PHOS  --  2.5  --  2.3* 2.3*   Liver Function Tests: Recent Labs  Lab 07/28/21 0359 07/30/21 0242 07/30/21  0450  ALBUMIN 2.4* 2.4* 2.4*   No results for input(s): LIPASE, AMYLASE in the last 168 hours. No results for input(s): AMMONIA in the last 168 hours. CBC: Recent Labs  Lab 07/26/21 0326 07/26/21 1905 07/28/21 0750 07/28/21 0910 07/29/21 1623 07/30/21 0242 07/30/21 0450  WBC 9.8  --  11.9*  --  11.6* 11.8* 11.3*  HGB 6.3*   < > 9.3* 8.5* 9.9* 8.8* 8.7*  HCT 19.3*   < > 28.5* 26.3* 31.0* 27.5* 27.2*  MCV 73.7*  --  74.8*  --  76.7* 76.4* 76.4*  PLT 267  --  324  --  351 333 328   < > = values in this interval not displayed.   Cardiac Enzymes: No results for input(s): CKTOTAL, CKMB, CKMBINDEX, TROPONINI in the last 168 hours. BNP: Invalid input(s): POCBNP CBG: Recent Labs  Lab 07/30/21 0642 07/30/21 0854 07/30/21 1204 07/30/21 2031 07/31/21 0802  GLUCAP 131* 137* 138* 106* 91   D-Dimer No results for input(s): DDIMER in the last 72 hours. Hgb A1c No results for input(s): HGBA1C in the last 72 hours. Lipid Profile No results for input(s): CHOL, HDL, LDLCALC, TRIG, CHOLHDL, LDLDIRECT in the last 72 hours. Thyroid function studies No results for input(s): TSH, T4TOTAL, T3FREE, THYROIDAB in the last 72 hours.  Invalid input(s): FREET3 Anemia work up No results for input(s): VITAMINB12, FOLATE, FERRITIN, TIBC, IRON, RETICCTPCT in the last 72 hours. Urinalysis    Component Value Date/Time   COLORURINE YELLOW 07/25/2020 1747   APPEARANCEUR CLEAR 07/25/2020 1747   LABSPEC 1.010 07/25/2020 1747   PHURINE 5.0 07/25/2020 1747   GLUCOSEU NEGATIVE 07/25/2020 1747   HGBUR SMALL (A) 07/25/2020 1747   BILIRUBINUR NEGATIVE 07/25/2020 1747   BILIRUBINUR negative  01/30/2020 [place] 07/25/2020 1747   PROTEINUR 100 (A) 07/25/2020 1747   UROBILINOGEN 0.2 01/30/2020 1652   UROBILINOGEN 0.2 05/25/2015 2112   NITRITE NEGATIVE 07/25/2020 1747   LEUKOCYTESUR NEGATIVE 07/25/2020 1747   Sepsis Labs Invalid input(s): PROCALCITONIN,  WBC,  LACTICIDVEN Microbiology Recent Results (from the past 240 hour(s))  Resp Panel by RT-PCR (Flu A&B, Covid) Nasopharyngeal Swab     Status: None   Collection Time: 07/21/21  3:21 PM   Specimen: Nasopharyngeal Swab; Nasopharyngeal(NP) swabs in vial transport medium  Result Value Ref Range Status   SARS Coronavirus 2 by RT PCR NEGATIVE NEGATIVE Final    Comment: (NOTE) SARS-CoV-2 target nucleic acids are NOT DETECTED.  The SARS-CoV-2 RNA is generally detectable in upper respiratory specimens during the acute phase of infection. The lowest concentration of SARS-CoV-2 viral copies this assay can detect is 138 copies/mL. A negative result does not preclude SARS-Cov-2 infection and should not be used as the sole basis for treatment or other patient management decisions. A negative result may occur with  improper specimen collection/handling, submission of specimen other than nasopharyngeal swab, presence of viral mutation(s) within the areas targeted by this assay, and inadequate number of viral copies(<138 copies/mL). A negative result must be combined with clinical observations, patient history, and epidemiological information. The expected result is Negative.  Fact Sheet for Patients:  EntrepreneurPulse.com.au  Fact Sheet for Healthcare Providers:  IncredibleEmployment.be  This test is no t yet approved or cleared by the [place] FDA and  has been authorized for detection and/or diagnosis of SARS-CoV-2 by FDA under an Emergency Use Authorization (EUA). This EUA will remain  in effect (meaning this test can be used) for the duration of the COVID-19  declaration under Section 564(b)(1) of the Act, 21 U.S.C.section 360bbb-3(b)(1), unless the authorization is terminated  or revoked sooner.       Influenza A by PCR NEGATIVE NEGATIVE Final   Influenza B by PCR NEGATIVE NEGATIVE Final    Comment: (NOTE) The Xpert Xpress SARS-CoV-2/FLU/RSV plus assay is intended as an aid in the diagnosis of influenza from Nasopharyngeal swab specimens and should not be used as a sole basis for treatment. Nasal washings and aspirates are unacceptable for Xpert Xpress SARS-CoV-2/FLU/RSV testing.  Fact Sheet for Patients: EntrepreneurPulse.com.au  Fact Sheet for Healthcare Providers: IncredibleEmployment.be  This test is not yet approved or cleared by the [place] FDA and has been authorized for detection and/or diagnosis of SARS-CoV-2 by FDA under an Emergency Use Authorization (EUA). This EUA will remain in effect (meaning this test can be used) for the duration of the COVID-19 declaration under Section 564(b)(1) of the Act, 21 U.S.C. section 360bbb-3(b)(1), unless the authorization is terminated or revoked.  Performed at [facility], [place] [address] [place], [place] [address]   Surgical pcr screen     Status: Abnormal   Collection Time: 07/22/21 11:53 PM   Specimen: Nasal Mucosa; Nasal Swab  Result Value Ref Range Status   MRSA, PCR NEGATIVE NEGATIVE Final   Staphylococcus aureus POSITIVE (A) NEGATIVE Final    Comment: (NOTE) The Xpert SA Assay (FDA approved for NASAL specimens in patients 63 years of age and older), is one component of a comprehensive surveillance program. It is not intended to diagnose infection nor to guide or monitor treatment. Performed at [facility], [place] [address] [place], [place] [address]     Please note: You were cared for by a hospitalist during your hospital stay. Once you are discharged, your primary care physician will handle any further medical  issues. Please note that NO REFILLS for any discharge medications will be authorized once you are discharged, as it is imperative that you return to your primary care physician (or establish a relationship with a primary care physician if you do not have one) for your post hospital discharge needs so that they can reassess your need for medications and monitor your lab values.    Time coordinating discharge: 40 minutes  SIGNED:   Shelly Coss, MD  [facility] 07/31/2021, 10:59 AM Pager [phone]  If 7PM-7AM, please contact night-coverage [email or web address] Password [email or web address]

## 2021-07-31 NOTE — Progress Notes (Signed)
Occupational Therapy Treatment Patient Details Name: [name] [name] MRN: [id number] DOB: 09/17/1935 Today's Date: 07/31/2021   History of present illness 85 yr old F admitted on 07/21/21 s/p fall resulting in R LE pain with inability to bear weight.  Imaging (+) for R subcapital femoral neck fx. Pt. underwent direct ant THA on 12/16. PMH: arthritis, CHF, CKD, dementia, DM, HTN, sleep apnea, TIA, ESRD on HD   OT comments  Pt reports needing to void bowel and bladder but does not complete this session. Notified RN of this. Family present for all education and pt without pain. Pt adequate level to d/c home with family from OT standpoint. Recommendations for [facility]   Recommendations for follow up therapy are one component of a multi-disciplinary discharge planning process, led by the attending physician.  Recommendations may be updated based on patient status, additional functional criteria and insurance authorization.    Follow Up Recommendations  Home health OT    Assistance Recommended at Discharge Intermittent Supervision/Assistance  Equipment Recommendations  BSC/3in1;Tub/shower seat;Other (comment)    Recommendations for Other Services      Precautions / Restrictions Precautions Precautions: Fall Restrictions Weight Bearing Restrictions: Yes RLE Weight Bearing: Weight bearing as tolerated       Mobility Bed Mobility Overal bed mobility: Needs Assistance Bed Mobility: Supine to Sit     Supine to sit: Min guard     General bed mobility comments: min cues but progressed without help. pt pre medicated prior to movement    Transfers Overall transfer level: Needs assistance Equipment used: Rolling walker (2 wheels) Transfers: Sit to/from Stand Sit to Stand: Min guard           General transfer comment: pt needed mod cues for hand placement on RW     Balance Overall balance assessment: Needs assistance Sitting-balance support: Bilateral upper extremity  supported;Feet supported Sitting balance-Leahy Scale: Fair     Standing balance support: Bilateral upper extremity supported;Reliant on assistive device for balance Standing balance-Leahy Scale: Poor                             ADL either performed or assessed with clinical judgement   ADL Overall ADL's : Needs assistance/impaired Eating/Feeding: Independent   Grooming: Oral care;Supervision/safety;Standing Grooming Details (indicate cue type and reason): needed min cues to initiate and mod (A) to sequence                 Toilet Transfer: Minimal assistance;BSC/3in1;Ambulation   Toileting- Clothing Manipulation and Hygiene: Min guard Toileting - Clothing Manipulation Details (indicate cue type and reason): no void but wiping to check     Functional mobility during ADLs: Minimal assistance;Rolling walker (2 wheels) General ADL Comments: pt requires cues with RW to keep it closer to decrease fall risk    Extremity/Trunk Assessment Upper Extremity Assessment Upper Extremity Assessment: Overall WFL for tasks assessed            Vision       Perception     Praxis      Cognition Arousal/Alertness: Awake/alert Behavior During Therapy: WFL for tasks assessed/performed Overall Cognitive Status: History of cognitive impairments - at baseline                                 General Comments: daughter present during treatment. pt thought she voided bowel and bladder with no output  Exercises     Shoulder Instructions       General Comments no dressing wound dry and staples in place    Pertinent Vitals/ Pain       Pain Assessment: No/denies pain  Home Living                                          Prior Functioning/Environment              Frequency  Min 2X/week        Progress Toward Goals  OT Goals(current goals can now be found in the care plan section)  Progress towards OT goals:  Progressing toward goals  Acute Rehab OT Goals Patient Stated Goal: none stated OT Goal Formulation: With patient Time For Goal Achievement: 08/07/21 Potential to Achieve Goals: Good ADL Goals Pt Will Perform Grooming: with modified independence;standing Pt Will Perform Lower Body Bathing: with supervision;sit to/from stand Pt Will Perform Lower Body Dressing: with supervision;sit to/from stand Pt Will Transfer to Toilet: with modified independence;ambulating  Plan Discharge plan remains appropriate;Frequency remains appropriate    Co-evaluation                 AM-PAC OT "6 Clicks" Daily Activity     Outcome Measure   Help from another person eating meals?: None Help from another person taking care of personal grooming?: A Little Help from another person toileting, which includes using toliet, bedpan, or urinal?: A Little Help from another person bathing (including washing, rinsing, drying)?: A Little Help from another person to put on and taking off regular upper body clothing?: A Little Help from another person to put on and taking off regular lower body clothing?: A Little 6 Click Score: 19    End of Session Equipment Utilized During Treatment: Rolling walker (2 wheels)  OT Visit Diagnosis: Unsteadiness on feet (R26.81);Muscle weakness (generalized) (M62.81);Other symptoms and signs involving cognitive function;Other abnormalities of gait and mobility (R26.89);History of falling (Z91.81)   Activity Tolerance Patient tolerated treatment well   Patient Left in chair;with call bell/phone within reach;with chair alarm set;with family/visitor present (daughter)   Nurse Communication Mobility status        Time: 2035-5974 OT Time Calculation (min): 35 min  Charges: OT General Charges $OT Visit: 1 Visit OT Treatments $Self Care/Home Management : 23-37 mins   Brynn, OTR/L  Acute Rehabilitation Services Pager: [phone] Office: [phone] .   Jeri Modena 07/31/2021, 10:40 AM

## 2021-08-02 DIAGNOSIS — Z992 Dependence on renal dialysis: Secondary | ICD-10-CM | POA: Diagnosis not present

## 2021-08-02 DIAGNOSIS — N2581 Secondary hyperparathyroidism of renal origin: Secondary | ICD-10-CM | POA: Diagnosis not present

## 2021-08-02 DIAGNOSIS — E1122 Type 2 diabetes mellitus with diabetic chronic kidney disease: Secondary | ICD-10-CM | POA: Diagnosis not present

## 2021-08-02 DIAGNOSIS — N186 End stage renal disease: Secondary | ICD-10-CM | POA: Diagnosis not present

## 2021-08-03 DIAGNOSIS — E1122 Type 2 diabetes mellitus with diabetic chronic kidney disease: Secondary | ICD-10-CM | POA: Diagnosis not present

## 2021-08-03 DIAGNOSIS — I480 Paroxysmal atrial fibrillation: Secondary | ICD-10-CM | POA: Diagnosis not present

## 2021-08-03 DIAGNOSIS — I5032 Chronic diastolic (congestive) heart failure: Secondary | ICD-10-CM | POA: Diagnosis not present

## 2021-08-03 DIAGNOSIS — I120 Hypertensive chronic kidney disease with stage 5 chronic kidney disease or end stage renal disease: Secondary | ICD-10-CM | POA: Diagnosis not present

## 2021-08-03 DIAGNOSIS — M069 Rheumatoid arthritis, unspecified: Secondary | ICD-10-CM | POA: Diagnosis not present

## 2021-08-03 DIAGNOSIS — S72011D Unspecified intracapsular fracture of right femur, subsequent encounter for closed fracture with routine healing: Secondary | ICD-10-CM | POA: Diagnosis not present

## 2021-08-03 DIAGNOSIS — D696 Thrombocytopenia, unspecified: Secondary | ICD-10-CM | POA: Diagnosis not present

## 2021-08-03 DIAGNOSIS — R69 Illness, unspecified: Secondary | ICD-10-CM | POA: Diagnosis not present

## 2021-08-03 DIAGNOSIS — R410 Disorientation, unspecified: Secondary | ICD-10-CM | POA: Diagnosis not present

## 2021-08-03 DIAGNOSIS — N186 End stage renal disease: Secondary | ICD-10-CM | POA: Diagnosis not present

## 2021-08-04 DIAGNOSIS — Z992 Dependence on renal dialysis: Secondary | ICD-10-CM | POA: Diagnosis not present

## 2021-08-04 DIAGNOSIS — N2581 Secondary hyperparathyroidism of renal origin: Secondary | ICD-10-CM | POA: Diagnosis not present

## 2021-08-04 DIAGNOSIS — E1122 Type 2 diabetes mellitus with diabetic chronic kidney disease: Secondary | ICD-10-CM | POA: Diagnosis not present

## 2021-08-04 DIAGNOSIS — N186 End stage renal disease: Secondary | ICD-10-CM | POA: Diagnosis not present

## 2021-08-05 DIAGNOSIS — I120 Hypertensive chronic kidney disease with stage 5 chronic kidney disease or end stage renal disease: Secondary | ICD-10-CM | POA: Diagnosis not present

## 2021-08-05 DIAGNOSIS — M069 Rheumatoid arthritis, unspecified: Secondary | ICD-10-CM | POA: Diagnosis not present

## 2021-08-05 DIAGNOSIS — S72011D Unspecified intracapsular fracture of right femur, subsequent encounter for closed fracture with routine healing: Secondary | ICD-10-CM | POA: Diagnosis not present

## 2021-08-05 DIAGNOSIS — I5032 Chronic diastolic (congestive) heart failure: Secondary | ICD-10-CM | POA: Diagnosis not present

## 2021-08-05 DIAGNOSIS — I480 Paroxysmal atrial fibrillation: Secondary | ICD-10-CM | POA: Diagnosis not present

## 2021-08-05 DIAGNOSIS — N186 End stage renal disease: Secondary | ICD-10-CM | POA: Diagnosis not present

## 2021-08-05 DIAGNOSIS — R69 Illness, unspecified: Secondary | ICD-10-CM | POA: Diagnosis not present

## 2021-08-05 DIAGNOSIS — E1122 Type 2 diabetes mellitus with diabetic chronic kidney disease: Secondary | ICD-10-CM | POA: Diagnosis not present

## 2021-08-05 DIAGNOSIS — D696 Thrombocytopenia, unspecified: Secondary | ICD-10-CM | POA: Diagnosis not present

## 2021-08-05 DIAGNOSIS — R410 Disorientation, unspecified: Secondary | ICD-10-CM | POA: Diagnosis not present

## 2021-08-06 DIAGNOSIS — N2581 Secondary hyperparathyroidism of renal origin: Secondary | ICD-10-CM | POA: Diagnosis not present

## 2021-08-06 DIAGNOSIS — Z992 Dependence on renal dialysis: Secondary | ICD-10-CM | POA: Diagnosis not present

## 2021-08-06 DIAGNOSIS — E1122 Type 2 diabetes mellitus with diabetic chronic kidney disease: Secondary | ICD-10-CM | POA: Diagnosis not present

## 2021-08-06 DIAGNOSIS — N186 End stage renal disease: Secondary | ICD-10-CM | POA: Diagnosis not present

## 2021-08-06 DIAGNOSIS — S72031D Displaced midcervical fracture of right femur, subsequent encounter for closed fracture with routine healing: Secondary | ICD-10-CM | POA: Diagnosis not present

## 2021-08-08 ENCOUNTER — Encounter (HOSPITAL_COMMUNITY): Payer: Self-pay | Admitting: Nephrology

## 2021-08-09 DIAGNOSIS — N186 End stage renal disease: Secondary | ICD-10-CM | POA: Diagnosis not present

## 2021-08-09 DIAGNOSIS — E1122 Type 2 diabetes mellitus with diabetic chronic kidney disease: Secondary | ICD-10-CM | POA: Diagnosis not present

## 2021-08-09 DIAGNOSIS — R52 Pain, unspecified: Secondary | ICD-10-CM | POA: Diagnosis not present

## 2021-08-09 DIAGNOSIS — N2581 Secondary hyperparathyroidism of renal origin: Secondary | ICD-10-CM | POA: Diagnosis not present

## 2021-08-09 DIAGNOSIS — Z992 Dependence on renal dialysis: Secondary | ICD-10-CM | POA: Diagnosis not present

## 2021-08-10 ENCOUNTER — Ambulatory Visit (INDEPENDENT_AMBULATORY_CARE_PROVIDER_SITE_OTHER): Payer: Commercial Managed Care - HMO | Admitting: Internal Medicine

## 2021-08-10 ENCOUNTER — Encounter: Payer: Self-pay | Admitting: Internal Medicine

## 2021-08-10 ENCOUNTER — Other Ambulatory Visit: Payer: Self-pay

## 2021-08-10 VITALS — BP 114/62 | HR 72 | Temp 98.2°F | Ht 60.0 in | Wt 125.4 lb

## 2021-08-10 DIAGNOSIS — E1122 Type 2 diabetes mellitus with diabetic chronic kidney disease: Secondary | ICD-10-CM | POA: Diagnosis not present

## 2021-08-10 DIAGNOSIS — I132 Hypertensive heart and chronic kidney disease with heart failure and with stage 5 chronic kidney disease, or end stage renal disease: Secondary | ICD-10-CM | POA: Diagnosis not present

## 2021-08-10 DIAGNOSIS — Z992 Dependence on renal dialysis: Secondary | ICD-10-CM | POA: Diagnosis not present

## 2021-08-10 DIAGNOSIS — D631 Anemia in chronic kidney disease: Secondary | ICD-10-CM | POA: Diagnosis not present

## 2021-08-10 DIAGNOSIS — N186 End stage renal disease: Secondary | ICD-10-CM | POA: Diagnosis not present

## 2021-08-10 DIAGNOSIS — N185 Chronic kidney disease, stage 5: Secondary | ICD-10-CM

## 2021-08-10 DIAGNOSIS — S72011D Unspecified intracapsular fracture of right femur, subsequent encounter for closed fracture with routine healing: Secondary | ICD-10-CM | POA: Diagnosis not present

## 2021-08-10 DIAGNOSIS — S72001D Fracture of unspecified part of neck of right femur, subsequent encounter for closed fracture with routine healing: Secondary | ICD-10-CM | POA: Diagnosis not present

## 2021-08-10 DIAGNOSIS — I480 Paroxysmal atrial fibrillation: Secondary | ICD-10-CM | POA: Diagnosis not present

## 2021-08-10 DIAGNOSIS — D6869 Other thrombophilia: Secondary | ICD-10-CM | POA: Diagnosis not present

## 2021-08-10 DIAGNOSIS — I120 Hypertensive chronic kidney disease with stage 5 chronic kidney disease or end stage renal disease: Secondary | ICD-10-CM | POA: Diagnosis not present

## 2021-08-10 DIAGNOSIS — I130 Hypertensive heart and chronic kidney disease with heart failure and stage 1 through stage 4 chronic kidney disease, or unspecified chronic kidney disease: Secondary | ICD-10-CM

## 2021-08-10 DIAGNOSIS — I5032 Chronic diastolic (congestive) heart failure: Secondary | ICD-10-CM | POA: Diagnosis not present

## 2021-08-10 DIAGNOSIS — Z09 Encounter for follow-up examination after completed treatment for conditions other than malignant neoplasm: Secondary | ICD-10-CM

## 2021-08-10 NOTE — Patient Instructions (Signed)
Anemia °Anemia is a condition in which there is not enough red blood cells or hemoglobin in the blood. Hemoglobin is a substance in red blood cells that carries oxygen. °When you do not have enough red blood cells or hemoglobin (are anemic), your body cannot get enough oxygen and your organs may not work properly. As a result, you may feel very tired or have other problems. °What are the causes? °Common causes of anemia include: °Excessive bleeding. Anemia can be caused by excessive bleeding inside or outside the body, including bleeding from the intestines or from heavy menstrual periods in females. °Poor nutrition. °[place]-lasting (chronic) kidney, thyroid, and liver disease. °Bone marrow disorders, spleen problems, and blood disorders. °Cancer and treatments for cancer. °HIV (human immunodeficiency virus) and AIDS (acquired immunodeficiency syndrome). °Infections, medicines, and autoimmune disorders that destroy red blood cells. °What are the signs or symptoms? °Symptoms of this condition include: °Minor weakness. °Dizziness. °Headache, or difficulties concentrating and sleeping. °Heartbeats that feel irregular or faster than normal (palpitations). °Shortness of breath, especially with exercise. °Pale skin, lips, and nails, or cold hands and feet. °Indigestion and nausea. °Symptoms may occur suddenly or develop slowly. If your anemia is mild, you may not have symptoms. °How is this diagnosed? °This condition is diagnosed based on blood tests, your medical history, and a physical exam. In some cases, a test may be needed in which cells are removed from the soft tissue inside of a bone and looked at under a microscope (bone marrow biopsy). Your health care provider may also check your stool (feces) for blood and may do additional testing to look for the cause of your bleeding. °Other tests may include: °Imaging tests, such as a CT scan or MRI. °A procedure to see inside your esophagus and stomach (endoscopy). °A  procedure to see inside your colon and rectum (colonoscopy). °How is this treated? °Treatment for this condition depends on the cause. If you continue to lose a lot of blood, you may need to be treated at a hospital. Treatment may include: °Taking supplements of iron, vitamin B12, or folic acid. °Taking a hormone medicine (erythropoietin) that can help to stimulate red blood cell growth. °Having a blood transfusion. This may be needed if you lose a lot of blood. °Making changes to your diet. °Having surgery to remove your spleen. °Follow these instructions at home: °Take over-the-counter and prescription medicines only as told by your health care provider. °Take supplements only as told by your health care provider. °Follow any diet instructions that you were given by your health care provider. °Keep all follow-up visits as told by your health care provider. This is important. °Contact a health care provider if: °You develop new bleeding anywhere in the body. °Get help right away if: °You are very weak. °You are short of breath. °You have pain in your abdomen or chest. °You are dizzy or feel faint. °You have trouble concentrating. °You have bloody stools, black stools, or tarry stools. °You vomit repeatedly or you vomit up blood. °These symptoms may represent a serious problem that is an emergency. Do not wait to see if the symptoms will go away. Get medical help right away. Call your local emergency services (911 in the [place] Do not drive yourself to the hospital. °Summary °Anemia is a condition in which you do not have enough red blood cells or enough of a substance in your red blood cells that carries oxygen (hemoglobin). °Symptoms may occur suddenly or develop slowly. °If your anemia is   mild, you may not have symptoms. °This condition is diagnosed with blood tests, a medical history, and a physical exam. Other tests may be needed. °Treatment for this condition depends on the cause of the anemia. °This  information is not intended to replace advice given to you by your health care provider. Make sure you discuss any questions you have with your health care provider. °Document Revised: 07/02/2019 Document Reviewed: 07/02/2019 °Elsevier Patient Education © 2022 [employer] ° °

## 2021-08-10 NOTE — Progress Notes (Signed)
I,Katawbba Wiggins,acting as a Education administrator for Maximino Greenland, MD.,have documented all relevant documentation on the behalf of Maximino Greenland, MD,as directed by  Maximino Greenland, MD while in the presence of Maximino Greenland, MD.  This visit occurred during the SARS-CoV-2 public health emergency.  Safety protocols were in place, including screening questions prior to the visit, additional usage of staff PPE, and extensive cleaning of exam room while observing appropriate contact time as indicated for disinfecting solutions.  Subjective:     Patient ID: [name] [name] , female    DOB: 10-11-1935 , 86 y.o.   MRN: [id number]   Chief Complaint  Patient presents with   Hospitalization Follow-up    HPI  Admit date: 07/21/2021 Discharge date: 07/31/2021  The patient is here for a hospital follow-up and would like a referral to the podiatrist and she wants a handicap placard application.  She is accompanied by her daughter today.   Patient is a 86 year old female with past medical history of ESRD on hemodialysis, diabetes mellitus type 2 diet-controlled, essential hypertension, paroxysmal atrial fibrillation on Eliquis, ambulatory dysfunction who presented with right hip pain after mechanical fall.  She states she fell at home on 12/14, walking into her bedroom while eating something. She doesn't recall tripping over anything.   She does recall her right foot hitting the side of the door, and she ended up on the ground. CT of the hip showed nondisplaced impacted subcapital right hip fracture.  Orthopedic surgery was consulted.  Patient underwent ORIF.  PT/OT recommended home PT upon discharge.  She has not had any issues since her discharge home. She is now living with one of her daughters.     Past Medical History:  Diagnosis Date   Arthritis    RA   CHF (congestive heart failure) (HCC)    CKD (chronic kidney disease) stage 3, GFR 30-59 ml/min (HCC)    M-W-F dialysis   Dementia (HCC)     Diabetes mellitus without complication (HCC)    insulin dependent   Hyperlipidemia    Hypertension    Seasonal allergies    Shortness of breath dyspnea    occasional with exertion - no oxygen   Sleep apnea    does not use cpap   TIA (transient ischemic attack) 2015     Family History  Problem Relation Age of Onset   Diabetes type II Mother    Hypertension Mother    Diabetes type II Father    Hypertension Other    Hyperlipidemia Other    Stroke Other      Current Outpatient Medications:    amLODipine (NORVASC) 10 MG tablet, TAKE 1 TABLET(10 MG) BY MOUTH DAILY (Patient not taking: Reported on 08/21/2021), Disp: 90 tablet, Rfl: 1   apixaban (ELIQUIS) 2.5 MG TABS tablet, TAKE 1 TABLET(2.5 MG) BY MOUTH TWICE DAILY (Patient taking differently: Take 2.5 mg by mouth 2 (two) times daily.), Disp: 60 tablet, Rfl: 2   ascorbic acid (VITAMIN C) 500 MG tablet, Take 1 tablet (500 mg total) by mouth daily., Disp: , Rfl:    atorvastatin (LIPITOR) 20 MG tablet, TAKE 1 TABLET(20 MG) BY MOUTH DAILY AT 6 PM FOR CHOLESTEROL (Patient taking differently: Take 20 mg by mouth daily.), Disp: 90 tablet, Rfl: 1   donepezil (ARICEPT) 5 MG tablet, TAKE 1 TABLET(5 MG) BY MOUTH AT BEDTIME (Patient not taking: Reported on 08/21/2021), Disp: 30 tablet, Rfl: 2   glucose blood test strip, Use as directed to check  blood sugars 3 times per day dx: e11.65 (Patient taking differently: 1 each by Other route See admin instructions. Use as directed to check blood sugars 3 times per day dx: e11.65), Disp: 100 each, Rfl: 12   HYDROcodone-acetaminophen (NORCO/VICODIN) 5-325 MG tablet, Take 1-2 tablets by mouth every 4 (four) hours as needed for moderate pain (pain score 4-6). (Patient not taking: Reported on 08/21/2021), Disp: 30 tablet, Rfl: 0   insulin detemir (LEVEMIR FLEXTOUCH) 100 UNIT/ML FlexPen, Inject 8 Units into the skin at bedtime. Please follow-up with your PCP as an outpatient to check if you continue to need the insulin.   Do not take insulin when your sugar is low.  Monitor your sugars at home (Patient taking differently: Inject 5 Units into the skin at bedtime. Please follow-up with your PCP as an outpatient to check if you continue to need the insulin.  Do not take insulin when your sugar is low.  Monitor your sugars at home), Disp: , Rfl:    loratadine (CLARITIN) 10 MG tablet, Take 10 mg by mouth daily. (Patient not taking: Reported on 08/21/2021), Disp: , Rfl:    metoprolol tartrate (LOPRESSOR) 25 MG tablet, TAKE 1/2 TABLET(12.5 MG) BY MOUTH TWICE DAILY (Patient taking differently: Take 12.5 mg by mouth 2 (two) times daily.), Disp: 90 tablet, Rfl: 1   polyethylene glycol (MIRALAX) 17 g packet, Take 17 g by mouth daily as needed. (Patient not taking: Reported on 08/21/2021), Disp: 14 each, Rfl: 0   zinc sulfate 220 (50 Zn) MG capsule, Take 1 capsule (220 mg total) by mouth daily., Disp: , Rfl:    blood glucose meter kit and supplies KIT, Dispense based on patient and insurance preference. Use up to four times daily as directed. (FOR ICD-9 250.00, 250.01)., Disp: 1 each, Rfl: 0   Blood Pressure Monitor KIT, Use as directed to check blood pressure as needed, Disp: 1 kit, Rfl: 1   cetirizine (ZYRTEC) 10 MG tablet, Take 10 mg by mouth daily., Disp: , Rfl:    cloNIDine (CATAPRES) 0.1 MG tablet, Take 0.1 mg by mouth daily., Disp: , Rfl:    lanthanum (FOSRENOL) 1000 MG chewable tablet, Chew 1,000 mg by mouth 3 (three) times daily with meals., Disp: , Rfl:    lidocaine (LMX) 4 % cream, SMARTSIG:Sparingly Topical As Directed, Disp: , Rfl:    Allergies  Allergen Reactions   Penicillins Swelling and Rash     Review of Systems  Constitutional: Negative.   Respiratory: Negative.    Cardiovascular: Negative.   Gastrointestinal: Negative.   Psychiatric/Behavioral: Negative.    All other systems reviewed and are negative.   Today's Vitals   08/10/21 1508  BP: 114/62  Pulse: 72  Temp: 98.2 F (36.8 C)  Weight: 125 lb  6.4 oz (56.9 kg)  Height: 5' (1.524 m)   Body mass index is 24.49 kg/m.  Wt Readings from Last 3 Encounters:  08/20/21 125 lb 7.1 oz (56.9 kg)  08/10/21 125 lb 6.4 oz (56.9 kg)  07/30/21 114 lb 6.7 oz (51.9 kg)    BP Readings from Last 3 Encounters:  08/21/21 127/62  08/10/21 114/62  07/31/21 (!) 144/61    Objective:  Physical Exam Vitals and nursing note reviewed.  Constitutional:      Appearance: Normal appearance.  HENT:     Head: Normocephalic and atraumatic.     Nose:     Comments: Masked     Mouth/Throat:     Comments: Masked  Eyes:  Extraocular Movements: Extraocular movements intact.  Cardiovascular:     Rate and Rhythm: Normal rate and regular rhythm.     Heart sounds: Normal heart sounds.     Comments: Thrill LUE Pulmonary:     Effort: Pulmonary effort is normal.     Breath sounds: Normal breath sounds.  Musculoskeletal:     Cervical back: Normal range of motion.  Skin:    General: Skin is warm.  Neurological:     General: No focal deficit present.     Mental Status: She is alert.  Psychiatric:        Mood and Affect: Mood normal.        Behavior: Behavior normal.        Assessment And Plan:     1. Closed fracture of right hip with routine healing, subsequent encounter  D/C SUMMARY WAS REVIEWED IN FULL DETAIL DURING THE VISIT. MEDS RECONCILED AND COMPARED TO DISCHARGE MEDS. MEDICATION LIST WAS UPDATED AND REVIEWED WITH THE PATIENT. GREATER THAN 50% FACE TO FACE TIME WAS SPENT IN COUNSELING AND COORDINATION OF CARE. ALL QUESTIONS WERE ANSWERED TO THE SATISFACTION OF THE PATIENT. Pt encouraged to fully participate in PT and to take Tylenol prior to her PT sessions.   2. Anemia due to chronic kidney disease, on chronic dialysis ([place]) Comments: Acute on chronic anemia. Hb dropped after surgery, she was given 3 units PRBCs during her hospitalization.  - CBC no Diff - Iron, TIBC and Ferritin Panel  3. Diabetes mellitus with end-stage renal disease  ([place]) Comments: Chronic, she will rto in 3 months for her next a1c. Importance of dietary/medication compliance was discussed with the patient and her daughter.  - Ambulatory referral to Podiatry  4. 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 She will c/w amlodipine and metoprolol. She is currently euvolemic.   5. Paroxysmal atrial fibrillation ([place]) Comments: She is rate controlled and properl anticoagulated. She will c/w metoprolol and Eliquis.   6. Acquired thrombophilia ([place]) Comments: She will continue anticoagulation with Eliquis due to PAF.  - CBC no Diff  7. Hospital discharge follow-up   Patient was given opportunity to ask questions. Patient verbalized understanding of the plan and was able to repeat key elements of the plan. All questions were answered to their satisfaction.   I, Maximino Greenland, MD, have reviewed all documentation for this visit. The documentation on 08/10/21 for the exam, diagnosis, procedures, and orders are all accurate and complete.   IF YOU HAVE BEEN REFERRED TO A SPECIALIST, IT MAY TAKE 1-2 WEEKS TO SCHEDULE/PROCESS THE REFERRAL. IF YOU HAVE NOT HEARD FROM US/SPECIALIST IN TWO WEEKS, PLEASE GIVE [place] A CALL AT [phone] X [phone].   THE PATIENT IS ENCOURAGED TO PRACTICE SOCIAL DISTANCING DUE TO THE COVID-19 PANDEMIC.

## 2021-08-11 DIAGNOSIS — N2581 Secondary hyperparathyroidism of renal origin: Secondary | ICD-10-CM | POA: Diagnosis not present

## 2021-08-11 DIAGNOSIS — Z992 Dependence on renal dialysis: Secondary | ICD-10-CM | POA: Diagnosis not present

## 2021-08-11 DIAGNOSIS — R52 Pain, unspecified: Secondary | ICD-10-CM | POA: Diagnosis not present

## 2021-08-11 DIAGNOSIS — E1122 Type 2 diabetes mellitus with diabetic chronic kidney disease: Secondary | ICD-10-CM | POA: Diagnosis not present

## 2021-08-11 DIAGNOSIS — N186 End stage renal disease: Secondary | ICD-10-CM | POA: Diagnosis not present

## 2021-08-11 LAB — IRON,TIBC AND FERRITIN PANEL
Ferritin: 2454 ng/mL — ABNORMAL HIGH (ref 15–150)
Iron Saturation: 22 % (ref 15–55)
Iron: 38 ug/dL (ref 27–139)
Total Iron Binding Capacity: 174 ug/dL — ABNORMAL LOW (ref 250–450)
UIBC: 136 ug/dL (ref 118–369)

## 2021-08-11 LAB — CBC
Hematocrit: 24.6 % — ABNORMAL LOW (ref 34.0–46.6)
Hemoglobin: 7.9 g/dL — ABNORMAL LOW (ref 11.1–15.9)
MCH: 23.7 pg — ABNORMAL LOW (ref 26.6–33.0)
MCHC: 32.1 g/dL (ref 31.5–35.7)
MCV: 74 fL — ABNORMAL LOW (ref 79–97)
Platelets: 321 10*3/uL (ref 150–450)
RBC: 3.33 x10E6/uL — ABNORMAL LOW (ref 3.77–5.28)
RDW: 16 % — ABNORMAL HIGH (ref 11.7–15.4)
WBC: 6.6 10*3/uL (ref 3.4–10.8)

## 2021-08-12 DIAGNOSIS — I120 Hypertensive chronic kidney disease with stage 5 chronic kidney disease or end stage renal disease: Secondary | ICD-10-CM | POA: Diagnosis not present

## 2021-08-12 DIAGNOSIS — E1122 Type 2 diabetes mellitus with diabetic chronic kidney disease: Secondary | ICD-10-CM | POA: Diagnosis not present

## 2021-08-12 DIAGNOSIS — I480 Paroxysmal atrial fibrillation: Secondary | ICD-10-CM | POA: Diagnosis not present

## 2021-08-12 DIAGNOSIS — S72011D Unspecified intracapsular fracture of right femur, subsequent encounter for closed fracture with routine healing: Secondary | ICD-10-CM | POA: Diagnosis not present

## 2021-08-12 DIAGNOSIS — N186 End stage renal disease: Secondary | ICD-10-CM | POA: Diagnosis not present

## 2021-08-12 DIAGNOSIS — I5032 Chronic diastolic (congestive) heart failure: Secondary | ICD-10-CM | POA: Diagnosis not present

## 2021-08-13 DIAGNOSIS — R52 Pain, unspecified: Secondary | ICD-10-CM | POA: Diagnosis not present

## 2021-08-13 DIAGNOSIS — N186 End stage renal disease: Secondary | ICD-10-CM | POA: Diagnosis not present

## 2021-08-13 DIAGNOSIS — E1122 Type 2 diabetes mellitus with diabetic chronic kidney disease: Secondary | ICD-10-CM | POA: Diagnosis not present

## 2021-08-13 DIAGNOSIS — N2581 Secondary hyperparathyroidism of renal origin: Secondary | ICD-10-CM | POA: Diagnosis not present

## 2021-08-13 DIAGNOSIS — Z992 Dependence on renal dialysis: Secondary | ICD-10-CM | POA: Diagnosis not present

## 2021-08-14 DIAGNOSIS — N186 End stage renal disease: Secondary | ICD-10-CM | POA: Diagnosis not present

## 2021-08-14 DIAGNOSIS — I480 Paroxysmal atrial fibrillation: Secondary | ICD-10-CM | POA: Diagnosis not present

## 2021-08-14 DIAGNOSIS — I120 Hypertensive chronic kidney disease with stage 5 chronic kidney disease or end stage renal disease: Secondary | ICD-10-CM | POA: Diagnosis not present

## 2021-08-14 DIAGNOSIS — I5032 Chronic diastolic (congestive) heart failure: Secondary | ICD-10-CM | POA: Diagnosis not present

## 2021-08-14 DIAGNOSIS — S72011D Unspecified intracapsular fracture of right femur, subsequent encounter for closed fracture with routine healing: Secondary | ICD-10-CM | POA: Diagnosis not present

## 2021-08-14 DIAGNOSIS — E1122 Type 2 diabetes mellitus with diabetic chronic kidney disease: Secondary | ICD-10-CM | POA: Diagnosis not present

## 2021-08-16 DIAGNOSIS — N2581 Secondary hyperparathyroidism of renal origin: Secondary | ICD-10-CM | POA: Diagnosis not present

## 2021-08-16 DIAGNOSIS — R52 Pain, unspecified: Secondary | ICD-10-CM | POA: Diagnosis not present

## 2021-08-16 DIAGNOSIS — Z992 Dependence on renal dialysis: Secondary | ICD-10-CM | POA: Diagnosis not present

## 2021-08-16 DIAGNOSIS — E1122 Type 2 diabetes mellitus with diabetic chronic kidney disease: Secondary | ICD-10-CM | POA: Diagnosis not present

## 2021-08-16 DIAGNOSIS — N186 End stage renal disease: Secondary | ICD-10-CM | POA: Diagnosis not present

## 2021-08-17 DIAGNOSIS — I120 Hypertensive chronic kidney disease with stage 5 chronic kidney disease or end stage renal disease: Secondary | ICD-10-CM | POA: Diagnosis not present

## 2021-08-17 DIAGNOSIS — I5032 Chronic diastolic (congestive) heart failure: Secondary | ICD-10-CM | POA: Diagnosis not present

## 2021-08-17 DIAGNOSIS — E1122 Type 2 diabetes mellitus with diabetic chronic kidney disease: Secondary | ICD-10-CM | POA: Diagnosis not present

## 2021-08-17 DIAGNOSIS — S72011D Unspecified intracapsular fracture of right femur, subsequent encounter for closed fracture with routine healing: Secondary | ICD-10-CM | POA: Diagnosis not present

## 2021-08-17 DIAGNOSIS — I480 Paroxysmal atrial fibrillation: Secondary | ICD-10-CM | POA: Diagnosis not present

## 2021-08-17 DIAGNOSIS — N186 End stage renal disease: Secondary | ICD-10-CM | POA: Diagnosis not present

## 2021-08-18 DIAGNOSIS — R52 Pain, unspecified: Secondary | ICD-10-CM | POA: Diagnosis not present

## 2021-08-18 DIAGNOSIS — E1122 Type 2 diabetes mellitus with diabetic chronic kidney disease: Secondary | ICD-10-CM | POA: Diagnosis not present

## 2021-08-18 DIAGNOSIS — N2581 Secondary hyperparathyroidism of renal origin: Secondary | ICD-10-CM | POA: Diagnosis not present

## 2021-08-18 DIAGNOSIS — N186 End stage renal disease: Secondary | ICD-10-CM | POA: Diagnosis not present

## 2021-08-18 DIAGNOSIS — Z992 Dependence on renal dialysis: Secondary | ICD-10-CM | POA: Diagnosis not present

## 2021-08-19 ENCOUNTER — Encounter (HOSPITAL_COMMUNITY): Payer: Self-pay | Admitting: Nephrology

## 2021-08-19 DIAGNOSIS — I480 Paroxysmal atrial fibrillation: Secondary | ICD-10-CM | POA: Diagnosis not present

## 2021-08-19 DIAGNOSIS — N186 End stage renal disease: Secondary | ICD-10-CM | POA: Diagnosis not present

## 2021-08-19 DIAGNOSIS — I120 Hypertensive chronic kidney disease with stage 5 chronic kidney disease or end stage renal disease: Secondary | ICD-10-CM | POA: Diagnosis not present

## 2021-08-19 DIAGNOSIS — I5032 Chronic diastolic (congestive) heart failure: Secondary | ICD-10-CM | POA: Diagnosis not present

## 2021-08-19 DIAGNOSIS — S72011D Unspecified intracapsular fracture of right femur, subsequent encounter for closed fracture with routine healing: Secondary | ICD-10-CM | POA: Diagnosis not present

## 2021-08-19 DIAGNOSIS — E1122 Type 2 diabetes mellitus with diabetic chronic kidney disease: Secondary | ICD-10-CM | POA: Diagnosis not present

## 2021-08-20 ENCOUNTER — Encounter (HOSPITAL_COMMUNITY): Payer: Self-pay | Admitting: *Deleted

## 2021-08-20 ENCOUNTER — Encounter: Payer: Self-pay | Admitting: Podiatry

## 2021-08-20 ENCOUNTER — Other Ambulatory Visit: Payer: Self-pay

## 2021-08-20 ENCOUNTER — Ambulatory Visit (INDEPENDENT_AMBULATORY_CARE_PROVIDER_SITE_OTHER): Payer: Commercial Managed Care - HMO | Admitting: Podiatry

## 2021-08-20 ENCOUNTER — Emergency Department (HOSPITAL_COMMUNITY)
Admission: EM | Admit: 2021-08-20 | Discharge: 2021-08-21 | Disposition: A | Discharge status: Home / Self Care | Payer: Medicare Other | Attending: Emergency Medicine | Admitting: Emergency Medicine

## 2021-08-20 ENCOUNTER — Encounter (HOSPITAL_COMMUNITY): Payer: Self-pay | Admitting: Nephrology

## 2021-08-20 ENCOUNTER — Telehealth: Payer: Self-pay

## 2021-08-20 DIAGNOSIS — F0390 Unspecified dementia without behavioral disturbance: Secondary | ICD-10-CM | POA: Diagnosis not present

## 2021-08-20 DIAGNOSIS — N184 Chronic kidney disease, stage 4 (severe): Secondary | ICD-10-CM

## 2021-08-20 DIAGNOSIS — M79674 Pain in right toe(s): Secondary | ICD-10-CM | POA: Diagnosis not present

## 2021-08-20 DIAGNOSIS — E1121 Type 2 diabetes mellitus with diabetic nephropathy: Secondary | ICD-10-CM

## 2021-08-20 DIAGNOSIS — E1122 Type 2 diabetes mellitus with diabetic chronic kidney disease: Secondary | ICD-10-CM | POA: Insufficient documentation

## 2021-08-20 DIAGNOSIS — R52 Pain, unspecified: Secondary | ICD-10-CM | POA: Diagnosis not present

## 2021-08-20 DIAGNOSIS — K5900 Constipation, unspecified: Secondary | ICD-10-CM | POA: Insufficient documentation

## 2021-08-20 DIAGNOSIS — M79675 Pain in left toe(s): Secondary | ICD-10-CM | POA: Diagnosis not present

## 2021-08-20 DIAGNOSIS — Z992 Dependence on renal dialysis: Secondary | ICD-10-CM | POA: Diagnosis not present

## 2021-08-20 DIAGNOSIS — R799 Abnormal finding of blood chemistry, unspecified: Secondary | ICD-10-CM | POA: Diagnosis present

## 2021-08-20 DIAGNOSIS — G459 Transient cerebral ischemic attack, unspecified: Secondary | ICD-10-CM

## 2021-08-20 DIAGNOSIS — I132 Hypertensive heart and chronic kidney disease with heart failure and with stage 5 chronic kidney disease, or end stage renal disease: Secondary | ICD-10-CM | POA: Diagnosis not present

## 2021-08-20 DIAGNOSIS — N179 Acute kidney failure, unspecified: Secondary | ICD-10-CM | POA: Diagnosis not present

## 2021-08-20 DIAGNOSIS — I120 Hypertensive chronic kidney disease with stage 5 chronic kidney disease or end stage renal disease: Secondary | ICD-10-CM | POA: Diagnosis not present

## 2021-08-20 DIAGNOSIS — N2581 Secondary hyperparathyroidism of renal origin: Secondary | ICD-10-CM | POA: Diagnosis not present

## 2021-08-20 DIAGNOSIS — N186 End stage renal disease: Secondary | ICD-10-CM | POA: Diagnosis not present

## 2021-08-20 DIAGNOSIS — B351 Tinea unguium: Secondary | ICD-10-CM | POA: Insufficient documentation

## 2021-08-20 DIAGNOSIS — D631 Anemia in chronic kidney disease: Secondary | ICD-10-CM | POA: Diagnosis not present

## 2021-08-20 DIAGNOSIS — Z96641 Presence of right artificial hip joint: Secondary | ICD-10-CM | POA: Diagnosis not present

## 2021-08-20 DIAGNOSIS — R1310 Dysphagia, unspecified: Secondary | ICD-10-CM | POA: Insufficient documentation

## 2021-08-20 DIAGNOSIS — I509 Heart failure, unspecified: Secondary | ICD-10-CM | POA: Insufficient documentation

## 2021-08-20 LAB — CBC WITH DIFFERENTIAL/PLATELET
Abs Immature Granulocytes: 0.01 10*3/uL (ref 0.00–0.07)
Basophils Absolute: 0 10*3/uL (ref 0.0–0.1)
Basophils Relative: 1 %
Eosinophils Absolute: 0.1 10*3/uL (ref 0.0–0.5)
Eosinophils Relative: 2 %
HCT: 21.9 % — ABNORMAL LOW (ref 36.0–46.0)
Hemoglobin: 6.7 g/dL — CL (ref 12.0–15.0)
Immature Granulocytes: 0 %
Lymphocytes Relative: 23 %
Lymphs Abs: 1.2 10*3/uL (ref 0.7–4.0)
MCH: 23.8 pg — ABNORMAL LOW (ref 26.0–34.0)
MCHC: 30.6 g/dL (ref 30.0–36.0)
MCV: 77.9 fL — ABNORMAL LOW (ref 80.0–100.0)
Monocytes Absolute: 0.4 10*3/uL (ref 0.1–1.0)
Monocytes Relative: 7 %
Neutro Abs: 3.8 10*3/uL (ref 1.7–7.7)
Neutrophils Relative %: 67 %
Platelets: 328 10*3/uL (ref 150–400)
RBC: 2.81 MIL/uL — ABNORMAL LOW (ref 3.87–5.11)
RDW: 17.6 % — ABNORMAL HIGH (ref 11.5–15.5)
WBC: 5.5 10*3/uL (ref 4.0–10.5)
nRBC: 0 % (ref 0.0–0.2)

## 2021-08-20 LAB — PROTIME-INR
INR: 1 (ref 0.8–1.2)
Prothrombin Time: 12.9 seconds (ref 11.4–15.2)

## 2021-08-20 LAB — BASIC METABOLIC PANEL
Anion gap: 9 (ref 5–15)
BUN: 5 mg/dL — ABNORMAL LOW (ref 8–23)
CO2: 32 mmol/L (ref 22–32)
Calcium: 7.6 mg/dL — ABNORMAL LOW (ref 8.9–10.3)
Chloride: 97 mmol/L — ABNORMAL LOW (ref 98–111)
Creatinine, Ser: 1.82 mg/dL — ABNORMAL HIGH (ref 0.44–1.00)
GFR, Estimated: 27 mL/min — ABNORMAL LOW (ref >60)
Glucose, Bld: 219 mg/dL — ABNORMAL HIGH (ref 70–99)
Potassium: 3 mmol/L — ABNORMAL LOW (ref 3.5–5.1)
Sodium: 138 mmol/L (ref 135–145)

## 2021-08-20 LAB — PREPARE RBC (CROSSMATCH)

## 2021-08-20 LAB — POC OCCULT BLOOD, ED: Fecal Occult Bld: NEGATIVE

## 2021-08-20 MED ORDER — SODIUM CHLORIDE 0.9 % IV SOLN: 10.0000 mL/h | Freq: Once | INTRAVENOUS | Status: DC

## 2021-08-20 MED ORDER — BLOOD GLUCOSE MONITOR KIT: PACK | 0 refills | Status: DC

## 2021-08-20 MED ORDER — BLOOD PRESSURE MONITOR KIT: PACK | 1 refills | Status: DC

## 2021-08-20 NOTE — ED Provider Notes (Signed)
[facility] Emergency Department Provider Note MRN:  [id number]  Arrival date & time: 08/21/21     Chief Complaint   Abnormal lab History of Present Illness   [name] [name] is a 86 y.o. year-old female with a history of ESRD, hypertension, diabetes presenting to the ED with chief complaint of abnormal lab.  Patient advised to come to the emergency department for blood transfusion.  He told this by her dialysis team.  She has no symptoms.  No chest pain, no shortness of breath, overall feels very well.  Denies any bleeding of any kind.  Takes Eliquis.  Review of Systems  A thorough review of systems was obtained and all systems are negative except as noted in the HPI and PMH.   Patient's Health History    Past Medical History:  Diagnosis Date   Arthritis    RA   CHF (congestive heart failure) (HCC)    CKD (chronic kidney disease) stage 3, GFR 30-59 ml/min (HCC)    M-W-F dialysis   Dementia (HCC)    Diabetes mellitus without complication (HCC)    insulin dependent   Hyperlipidemia    Hypertension    Seasonal allergies    Shortness of breath dyspnea    occasional with exertion - no oxygen   Sleep apnea    does not use cpap   TIA (transient ischemic attack) 2015    Past Surgical History:  Procedure Laterality Date   ABDOMINAL HYSTERECTOMY  1974   AV FISTULA PLACEMENT Left 12/10/2020   Procedure: INSERTION OF ARTERIOVENOUS (AV) GORE-TEX GRAFT LEFT ARM;  Surgeon: Serafina Mitchell, MD;  Location: [facility] OR;  Service: Vascular;  Laterality: Left;   COLONOSCOPY     EYE SURGERY Bilateral    cataracts removed   IR FLUORO GUIDE CV LINE RIGHT  07/29/2020   IR FLUORO GUIDE CV LINE RIGHT  08/04/2020   IR US GUIDE VASC ACCESS RIGHT  07/29/2020   MULTIPLE TOOTH EXTRACTIONS     TOTAL HIP ARTHROPLASTY Right 07/23/2021   Procedure: TOTAL HIP ARTHROPLASTY ANTERIOR APPROACH;  Surgeon: Rod Can, MD;  Location: [place] [place];  Service: Orthopedics;  Laterality:  Right;    Family History  Problem Relation Age of Onset   Diabetes type II Mother    Hypertension Mother    Diabetes type II Father    Hypertension Other    Hyperlipidemia Other    Stroke Other     Social History   Socioeconomic History   Marital status: Married    Spouse name: Not on file   Number of children: 6   Years of education: 12th   Highest education level: Not on file  Occupational History   Occupation: retired    Fish farm manager: RETIRED  Tobacco Use   Smoking status: Never   Smokeless tobacco: Never  Vaping Use   Vaping Use: Never used  Substance and Sexual Activity   Alcohol use: No    Alcohol/week: 0.0 standard drinks   Drug use: No   Sexual activity: Not Currently    Birth control/protection: Surgical    Comment: Hysterectomy  Other Topics Concern   Not on file  Social History Narrative   Patient lives with her husband    Patient is right handed   Patient drinks tea   Social Determinants of Health   Financial Resource Strain: Low Risk    Difficulty of Paying Living Expenses: Not hard at all  Food Insecurity: No Food Insecurity   Worried About  Running Out of Food in the Last Year: Never true   Ran Out of Food in the Last Year: Never true  Transportation Needs: No Transportation Needs   Lack of Transportation (Medical): No   Lack of Transportation (Non-Medical): No  Physical Activity: Inactive   Days of Exercise per Week: 0 days   Minutes of Exercise per Session: 0 min  Stress: No Stress Concern Present   Feeling of Stress : Not at all  Social Connections: Not on file  Intimate Partner Violence: Not on file     Physical Exam   Vitals:   08/21/21 0333 08/21/21 0458  BP: 124/63 127/62  Pulse: 88 88  Resp: 14 16  Temp: 98.5 F (36.9 C) 98.2 F (36.8 C)  SpO2: 100% 100%    CONSTITUTIONAL: Well-appearing, NAD NEURO/PSYCH:  Alert and oriented x 3, no focal deficits EYES:  eyes equal and reactive ENT/NECK:  no LAD, no JVD CARDIO: Regular  rate, well-perfused, normal S1 and S2 PULM:  CTAB no wheezing or rhonchi GI/GU:  non-distended, non-tender MSK/SPINE:  No gross deformities, no edema SKIN:  no rash, atraumatic   *Additional and/or pertinent findings included in MDM below  Diagnostic and Interventional Summary    EKG Interpretation  Date/Time:    Ventricular Rate:    PR Interval:    QRS Duration:   QT Interval:    QTC Calculation:   R Axis:     Text Interpretation:         Labs Reviewed  CBC WITH DIFFERENTIAL/PLATELET - Abnormal; Notable for the following components:      Result Value   RBC 2.81 (*)    Hemoglobin 6.7 (*)    HCT 21.9 (*)    MCV 77.9 (*)    MCH 23.8 (*)    RDW 17.6 (*)    All other components within normal limits  BASIC METABOLIC PANEL - Abnormal; Notable for the following components:   Potassium 3.0 (*)    Chloride 97 (*)    Glucose, Bld 219 (*)    BUN 5 (*)    Creatinine, Ser 1.82 (*)    Calcium 7.6 (*)    GFR, Estimated 27 (*)    All other components within normal limits  PROTIME-INR  OCCULT BLOOD X 1 CARD TO LAB, STOOL  POC OCCULT BLOOD, ED  TYPE AND SCREEN  PREPARE RBC (CROSSMATCH)    No orders to display    Medications  0.9 %  sodium chloride infusion (has no administration in time range)     Procedures  /  Critical Care Procedures  ED Course and Medical Decision Making  Initial Impression and Ddx Sent here for anemia, hemoglobin here is 6.7.  Favoring chronic anemia in setting of ESRD, hemoglobin has trended down 1 unit over the past 10 days.  Is anticoagulated.  Will obtain Hemoccult.  If negative would consider discharge after transfusion as patient has no symptoms, normal vital signs, wants to go home.  Past medical/surgical history that increases complexity of ED encounter: End-stage renal disease, anticoagulated status  Interpretation of Diagnostics Hemoglobin is 6.7  Patient Reassessment and Ultimate Disposition/Management Patient feeling well 1 hour  after blood transfusion, no signs of adverse reactions, appropriate for discharge.  Patient management required discussion with the following services or consulting groups:  Nephrology  Complexity of Problems Addressed Chronic illness with exacerbation  Additional Data Reviewed and Analyzed Further history obtained from: Further history from spouse/family member  Factors Impacting ED Encounter Risk Consideration  of hospitalization  Barth Kirks. [place] Small, [place] [email or web address]  Final Clinical Impressions(s) / ED Diagnoses     ICD-10-CM   1. Anemia due to chronic kidney disease, on chronic dialysis ([place])  N18.6    D63.1    Z99.2       ED Discharge Orders     None        Discharge Instructions Discussed with and Provided to Patient:     Discharge Instructions      You were evaluated in the Emergency Department and after careful evaluation, we did not find any emergent condition requiring admission or further testing in the hospital.  Your exam/testing today is overall reassuring.  We have provided you with a blood transfusion here in the emergency department.  Please follow-up closely with your regular doctors for further care.  Please return to the Emergency Department if you experience any worsening of your condition.   Thank you for allowing [place] to be a part of your care.       Maudie Flakes, MD 08/21/21 [phone]

## 2021-08-20 NOTE — Telephone Encounter (Signed)
The pt's daughter Mrs Zigmund Daniel wanted to know if the pt can do a blood expander procedure instead the blood transfusion because she thinks that it would be safer because she is uneasy about using the blood.

## 2021-08-20 NOTE — Progress Notes (Signed)
This patient presents  to my office for at risk foot care.  This patient requires this care by a professional since this patient will be at risk due to having thrombocytopenia, ESRD, CKD and type 2 diabetes.  Patient has coagulation defect due to eliquis. This patient presents to the office with her daughter. This patient is unable to cut nails herself since the patient cannot reach her nails.These nails are painful walking and wearing shoes.  This patient presents for at risk foot care today.  General Appearance  Alert, conversant and in no acute stress.  Vascular  Dorsalis pedis pulses are  weakly palpable  bilaterally. Posterior tibial pulses are absent. Capillary return is within normal limits  bilaterally. Cold feet bilaterally.  Absent digital hair.  Neurologic  Senn-Weinstein monofilament wire test diminished/absent bilaterally. Muscle power within normal limits bilaterally.  Nails Thick disfigured discolored nails with subungual debris  from hallux to fifth toes bilaterally. No evidence of bacterial infection or drainage bilaterally.  Orthopedic  No limitations of motion  feet .  No crepitus or effusions noted.  No bony pathology or digital deformities noted. Mild  HAV  B/L.  Skin  normotropic skin with no porokeratosis noted bilaterally.  No signs of infections or ulcers noted.     IE  Onychomycosis  Pain in right toes  Pain in left toes  Consent was obtained for treatment procedures.   Mechanical debridement of nails 1-5  bilaterally performed with a nail nipper.  Filed with dremel without incident.  Patient qualifies for diabetic shoes due to DPN and HAV  B/L.  Patient to be measured for shoes next visit. Patient was not initially scheduled for vascular studies but after further talking to her daughter an order was put in for vascular studies.     Return office visit   3 months                   Told patient to return for periodic foot care and evaluation due to potential at risk  complications.   Gardiner Barefoot DPM

## 2021-08-20 NOTE — ED Triage Notes (Signed)
The pt is a dialysis pt and was dialyzed today  blood was drawn and her hgb was low.   Rt hip replacement  3 weeks ago  fistula lt arm

## 2021-08-20 NOTE — ED Provider Triage Note (Signed)
Emergency Medicine Provider Triage Evaluation Note  [name] [name] , a 86 y.o. female  was evaluated in triage.  Pt complains of NO COMPLAINTS. Seems to be feeling well today.  Was told at her dialysis center today that her hemoglobin was low.  She has labs with her today which show a hemoglobin of 6.6 but it appears that these labs were drawn few days ago.    Review of Systems  Positive: Abn labs Negative: LH, sob, CP, fatigue  Physical Exam  BP (!) 125/48    Pulse 74    Temp 98.6 F (37 C)    Resp 18    Ht 5' (1.524 m)    Wt 56.9 kg    SpO2 100%    BMI 24.50 kg/m  Gen:   Awake, no distress   Resp:  Normal effort  MSK:   Moves extremities without difficulty  Other:    Medical Decision Making  Medically screening exam initiated at 5:48 PM.  Appropriate orders placed.  [name] [name] Sitter was informed that the remainder of the evaluation will be completed by another provider, this initial triage assessment does not replace that evaluation, and the importance of remaining in the ED until their evaluation is complete.  Type and screen, basic labs   [name] [name], [place] 08/20/21 1750

## 2021-08-21 DIAGNOSIS — Z992 Dependence on renal dialysis: Secondary | ICD-10-CM | POA: Diagnosis not present

## 2021-08-21 DIAGNOSIS — N186 End stage renal disease: Secondary | ICD-10-CM | POA: Diagnosis not present

## 2021-08-21 DIAGNOSIS — I120 Hypertensive chronic kidney disease with stage 5 chronic kidney disease or end stage renal disease: Secondary | ICD-10-CM | POA: Diagnosis not present

## 2021-08-21 DIAGNOSIS — D631 Anemia in chronic kidney disease: Secondary | ICD-10-CM | POA: Diagnosis not present

## 2021-08-21 LAB — TYPE AND SCREEN
ABO/RH(D): B POS
Antibody Screen: NEGATIVE
Unit division: 0

## 2021-08-21 LAB — BPAM RBC
Blood Product Expiration Date: 202301262359
ISSUE DATE / TIME: 202301132340
Unit Type and Rh: 7300

## 2021-08-21 NOTE — Discharge Instructions (Signed)
You were evaluated in the Emergency Department and after careful evaluation, we did not find any emergent condition requiring admission or further testing in the hospital.  Your exam/testing today is overall reassuring.  We have provided you with a blood transfusion here in the emergency department.  Please follow-up closely with your regular doctors for further care.  Please return to the Emergency Department if you experience any worsening of your condition.   Thank you for allowing [place] to be a part of your care.

## 2021-08-23 DIAGNOSIS — Z992 Dependence on renal dialysis: Secondary | ICD-10-CM | POA: Diagnosis not present

## 2021-08-23 DIAGNOSIS — E1122 Type 2 diabetes mellitus with diabetic chronic kidney disease: Secondary | ICD-10-CM | POA: Diagnosis not present

## 2021-08-23 DIAGNOSIS — N186 End stage renal disease: Secondary | ICD-10-CM | POA: Diagnosis not present

## 2021-08-23 DIAGNOSIS — R52 Pain, unspecified: Secondary | ICD-10-CM | POA: Diagnosis not present

## 2021-08-23 DIAGNOSIS — N2581 Secondary hyperparathyroidism of renal origin: Secondary | ICD-10-CM | POA: Diagnosis not present

## 2021-08-25 DIAGNOSIS — E1122 Type 2 diabetes mellitus with diabetic chronic kidney disease: Secondary | ICD-10-CM | POA: Diagnosis not present

## 2021-08-25 DIAGNOSIS — N2581 Secondary hyperparathyroidism of renal origin: Secondary | ICD-10-CM | POA: Diagnosis not present

## 2021-08-25 DIAGNOSIS — Z992 Dependence on renal dialysis: Secondary | ICD-10-CM | POA: Diagnosis not present

## 2021-08-25 DIAGNOSIS — R52 Pain, unspecified: Secondary | ICD-10-CM | POA: Diagnosis not present

## 2021-08-25 DIAGNOSIS — N186 End stage renal disease: Secondary | ICD-10-CM | POA: Diagnosis not present

## 2021-08-26 DIAGNOSIS — S72011D Unspecified intracapsular fracture of right femur, subsequent encounter for closed fracture with routine healing: Secondary | ICD-10-CM | POA: Diagnosis not present

## 2021-08-26 DIAGNOSIS — E1122 Type 2 diabetes mellitus with diabetic chronic kidney disease: Secondary | ICD-10-CM | POA: Diagnosis not present

## 2021-08-26 DIAGNOSIS — I120 Hypertensive chronic kidney disease with stage 5 chronic kidney disease or end stage renal disease: Secondary | ICD-10-CM | POA: Diagnosis not present

## 2021-08-26 DIAGNOSIS — I5032 Chronic diastolic (congestive) heart failure: Secondary | ICD-10-CM | POA: Diagnosis not present

## 2021-08-26 DIAGNOSIS — N186 End stage renal disease: Secondary | ICD-10-CM | POA: Diagnosis not present

## 2021-08-26 DIAGNOSIS — I480 Paroxysmal atrial fibrillation: Secondary | ICD-10-CM | POA: Diagnosis not present

## 2021-08-27 ENCOUNTER — Telehealth: Payer: Self-pay | Admitting: *Deleted

## 2021-08-27 DIAGNOSIS — Z992 Dependence on renal dialysis: Secondary | ICD-10-CM | POA: Diagnosis not present

## 2021-08-27 DIAGNOSIS — R52 Pain, unspecified: Secondary | ICD-10-CM | POA: Diagnosis not present

## 2021-08-27 DIAGNOSIS — N186 End stage renal disease: Secondary | ICD-10-CM | POA: Diagnosis not present

## 2021-08-27 DIAGNOSIS — N2581 Secondary hyperparathyroidism of renal origin: Secondary | ICD-10-CM | POA: Diagnosis not present

## 2021-08-27 DIAGNOSIS — E1122 Type 2 diabetes mellitus with diabetic chronic kidney disease: Secondary | ICD-10-CM | POA: Diagnosis not present

## 2021-08-27 NOTE — Telephone Encounter (Signed)
Transition Care Management Unsuccessful Follow-up Telephone Call  Date of discharge and from where:  07/31/21 [facility]  Attempts:  1st Attempt  Reason for unsuccessful TCM follow-up call:  Left voice message   Kelli Churn RN, CCM, [place] Management Coordinator - Managed [place] High Risk [phone]

## 2021-08-30 ENCOUNTER — Telehealth: Payer: Self-pay

## 2021-08-30 DIAGNOSIS — N186 End stage renal disease: Secondary | ICD-10-CM | POA: Diagnosis not present

## 2021-08-30 DIAGNOSIS — N2581 Secondary hyperparathyroidism of renal origin: Secondary | ICD-10-CM | POA: Diagnosis not present

## 2021-08-30 DIAGNOSIS — Z992 Dependence on renal dialysis: Secondary | ICD-10-CM | POA: Diagnosis not present

## 2021-08-30 NOTE — Telephone Encounter (Signed)
Transition Care Management Unsuccessful Follow-up Telephone Call  Date of discharge and from where:  07/31/2021 [facility]  Attempts:  2nd Attempt  Reason for unsuccessful TCM follow-up call:  No answer/busy Tomasa Rand, RN, BSN, CEN [place] Coordinator [phone]

## 2021-08-31 DIAGNOSIS — I480 Paroxysmal atrial fibrillation: Secondary | ICD-10-CM | POA: Diagnosis not present

## 2021-08-31 DIAGNOSIS — I5032 Chronic diastolic (congestive) heart failure: Secondary | ICD-10-CM | POA: Diagnosis not present

## 2021-08-31 DIAGNOSIS — S72011D Unspecified intracapsular fracture of right femur, subsequent encounter for closed fracture with routine healing: Secondary | ICD-10-CM | POA: Diagnosis not present

## 2021-08-31 DIAGNOSIS — E1122 Type 2 diabetes mellitus with diabetic chronic kidney disease: Secondary | ICD-10-CM | POA: Diagnosis not present

## 2021-08-31 DIAGNOSIS — N186 End stage renal disease: Secondary | ICD-10-CM | POA: Diagnosis not present

## 2021-08-31 DIAGNOSIS — I120 Hypertensive chronic kidney disease with stage 5 chronic kidney disease or end stage renal disease: Secondary | ICD-10-CM | POA: Diagnosis not present

## 2021-09-01 DIAGNOSIS — N2581 Secondary hyperparathyroidism of renal origin: Secondary | ICD-10-CM | POA: Diagnosis not present

## 2021-09-01 DIAGNOSIS — N186 End stage renal disease: Secondary | ICD-10-CM | POA: Diagnosis not present

## 2021-09-01 DIAGNOSIS — Z992 Dependence on renal dialysis: Secondary | ICD-10-CM | POA: Diagnosis not present

## 2021-09-03 DIAGNOSIS — Z992 Dependence on renal dialysis: Secondary | ICD-10-CM | POA: Diagnosis not present

## 2021-09-03 DIAGNOSIS — N2581 Secondary hyperparathyroidism of renal origin: Secondary | ICD-10-CM | POA: Diagnosis not present

## 2021-09-03 DIAGNOSIS — N186 End stage renal disease: Secondary | ICD-10-CM | POA: Diagnosis not present

## 2021-09-06 DIAGNOSIS — Z992 Dependence on renal dialysis: Secondary | ICD-10-CM | POA: Diagnosis not present

## 2021-09-06 DIAGNOSIS — N2581 Secondary hyperparathyroidism of renal origin: Secondary | ICD-10-CM | POA: Diagnosis not present

## 2021-09-06 DIAGNOSIS — N186 End stage renal disease: Secondary | ICD-10-CM | POA: Diagnosis not present

## 2021-09-07 DIAGNOSIS — I5032 Chronic diastolic (congestive) heart failure: Secondary | ICD-10-CM | POA: Diagnosis not present

## 2021-09-07 DIAGNOSIS — I480 Paroxysmal atrial fibrillation: Secondary | ICD-10-CM | POA: Diagnosis not present

## 2021-09-07 DIAGNOSIS — S72011D Unspecified intracapsular fracture of right femur, subsequent encounter for closed fracture with routine healing: Secondary | ICD-10-CM | POA: Diagnosis not present

## 2021-09-07 DIAGNOSIS — Z992 Dependence on renal dialysis: Secondary | ICD-10-CM | POA: Diagnosis not present

## 2021-09-07 DIAGNOSIS — E1122 Type 2 diabetes mellitus with diabetic chronic kidney disease: Secondary | ICD-10-CM | POA: Diagnosis not present

## 2021-09-07 DIAGNOSIS — I120 Hypertensive chronic kidney disease with stage 5 chronic kidney disease or end stage renal disease: Secondary | ICD-10-CM | POA: Diagnosis not present

## 2021-09-07 DIAGNOSIS — N186 End stage renal disease: Secondary | ICD-10-CM | POA: Diagnosis not present

## 2021-09-08 DIAGNOSIS — E1110 Type 2 diabetes mellitus with ketoacidosis without coma: Secondary | ICD-10-CM | POA: Diagnosis not present

## 2021-09-08 DIAGNOSIS — Z992 Dependence on renal dialysis: Secondary | ICD-10-CM | POA: Diagnosis not present

## 2021-09-08 DIAGNOSIS — N2581 Secondary hyperparathyroidism of renal origin: Secondary | ICD-10-CM | POA: Diagnosis not present

## 2021-09-08 DIAGNOSIS — N186 End stage renal disease: Secondary | ICD-10-CM | POA: Diagnosis not present

## 2021-09-09 DIAGNOSIS — E1122 Type 2 diabetes mellitus with diabetic chronic kidney disease: Secondary | ICD-10-CM | POA: Diagnosis not present

## 2021-09-09 DIAGNOSIS — S72011D Unspecified intracapsular fracture of right femur, subsequent encounter for closed fracture with routine healing: Secondary | ICD-10-CM | POA: Diagnosis not present

## 2021-09-09 DIAGNOSIS — I5032 Chronic diastolic (congestive) heart failure: Secondary | ICD-10-CM | POA: Diagnosis not present

## 2021-09-09 DIAGNOSIS — I480 Paroxysmal atrial fibrillation: Secondary | ICD-10-CM | POA: Diagnosis not present

## 2021-09-09 DIAGNOSIS — N186 End stage renal disease: Secondary | ICD-10-CM | POA: Diagnosis not present

## 2021-09-09 DIAGNOSIS — I120 Hypertensive chronic kidney disease with stage 5 chronic kidney disease or end stage renal disease: Secondary | ICD-10-CM | POA: Diagnosis not present

## 2021-09-10 DIAGNOSIS — N2581 Secondary hyperparathyroidism of renal origin: Secondary | ICD-10-CM | POA: Diagnosis not present

## 2021-09-10 DIAGNOSIS — Z992 Dependence on renal dialysis: Secondary | ICD-10-CM | POA: Diagnosis not present

## 2021-09-10 DIAGNOSIS — N186 End stage renal disease: Secondary | ICD-10-CM | POA: Diagnosis not present

## 2021-09-10 DIAGNOSIS — E1110 Type 2 diabetes mellitus with ketoacidosis without coma: Secondary | ICD-10-CM | POA: Diagnosis not present

## 2021-09-13 DIAGNOSIS — Z992 Dependence on renal dialysis: Secondary | ICD-10-CM | POA: Diagnosis not present

## 2021-09-13 DIAGNOSIS — E1110 Type 2 diabetes mellitus with ketoacidosis without coma: Secondary | ICD-10-CM | POA: Diagnosis not present

## 2021-09-13 DIAGNOSIS — N186 End stage renal disease: Secondary | ICD-10-CM | POA: Diagnosis not present

## 2021-09-13 DIAGNOSIS — N2581 Secondary hyperparathyroidism of renal origin: Secondary | ICD-10-CM | POA: Diagnosis not present

## 2021-09-15 DIAGNOSIS — E1110 Type 2 diabetes mellitus with ketoacidosis without coma: Secondary | ICD-10-CM | POA: Diagnosis not present

## 2021-09-15 DIAGNOSIS — N2581 Secondary hyperparathyroidism of renal origin: Secondary | ICD-10-CM | POA: Diagnosis not present

## 2021-09-15 DIAGNOSIS — Z992 Dependence on renal dialysis: Secondary | ICD-10-CM | POA: Diagnosis not present

## 2021-09-15 DIAGNOSIS — N186 End stage renal disease: Secondary | ICD-10-CM | POA: Diagnosis not present

## 2021-09-16 DIAGNOSIS — I120 Hypertensive chronic kidney disease with stage 5 chronic kidney disease or end stage renal disease: Secondary | ICD-10-CM | POA: Diagnosis not present

## 2021-09-16 DIAGNOSIS — N186 End stage renal disease: Secondary | ICD-10-CM | POA: Diagnosis not present

## 2021-09-16 DIAGNOSIS — E1122 Type 2 diabetes mellitus with diabetic chronic kidney disease: Secondary | ICD-10-CM | POA: Diagnosis not present

## 2021-09-16 DIAGNOSIS — I5032 Chronic diastolic (congestive) heart failure: Secondary | ICD-10-CM | POA: Diagnosis not present

## 2021-09-16 DIAGNOSIS — S72011D Unspecified intracapsular fracture of right femur, subsequent encounter for closed fracture with routine healing: Secondary | ICD-10-CM | POA: Diagnosis not present

## 2021-09-16 DIAGNOSIS — I480 Paroxysmal atrial fibrillation: Secondary | ICD-10-CM | POA: Diagnosis not present

## 2021-09-17 DIAGNOSIS — E1110 Type 2 diabetes mellitus with ketoacidosis without coma: Secondary | ICD-10-CM | POA: Diagnosis not present

## 2021-09-17 DIAGNOSIS — N186 End stage renal disease: Secondary | ICD-10-CM | POA: Diagnosis not present

## 2021-09-17 DIAGNOSIS — N2581 Secondary hyperparathyroidism of renal origin: Secondary | ICD-10-CM | POA: Diagnosis not present

## 2021-09-17 DIAGNOSIS — Z992 Dependence on renal dialysis: Secondary | ICD-10-CM | POA: Diagnosis not present

## 2021-09-20 DIAGNOSIS — N186 End stage renal disease: Secondary | ICD-10-CM | POA: Diagnosis not present

## 2021-09-20 DIAGNOSIS — E1110 Type 2 diabetes mellitus with ketoacidosis without coma: Secondary | ICD-10-CM | POA: Diagnosis not present

## 2021-09-20 DIAGNOSIS — Z992 Dependence on renal dialysis: Secondary | ICD-10-CM | POA: Diagnosis not present

## 2021-09-20 DIAGNOSIS — N2581 Secondary hyperparathyroidism of renal origin: Secondary | ICD-10-CM | POA: Diagnosis not present

## 2021-09-21 DIAGNOSIS — I480 Paroxysmal atrial fibrillation: Secondary | ICD-10-CM | POA: Diagnosis not present

## 2021-09-21 DIAGNOSIS — N186 End stage renal disease: Secondary | ICD-10-CM | POA: Diagnosis not present

## 2021-09-21 DIAGNOSIS — E1122 Type 2 diabetes mellitus with diabetic chronic kidney disease: Secondary | ICD-10-CM | POA: Diagnosis not present

## 2021-09-21 DIAGNOSIS — S72011D Unspecified intracapsular fracture of right femur, subsequent encounter for closed fracture with routine healing: Secondary | ICD-10-CM | POA: Diagnosis not present

## 2021-09-21 DIAGNOSIS — I5032 Chronic diastolic (congestive) heart failure: Secondary | ICD-10-CM | POA: Diagnosis not present

## 2021-09-21 DIAGNOSIS — I120 Hypertensive chronic kidney disease with stage 5 chronic kidney disease or end stage renal disease: Secondary | ICD-10-CM | POA: Diagnosis not present

## 2021-09-22 DIAGNOSIS — N2581 Secondary hyperparathyroidism of renal origin: Secondary | ICD-10-CM | POA: Diagnosis not present

## 2021-09-22 DIAGNOSIS — E1110 Type 2 diabetes mellitus with ketoacidosis without coma: Secondary | ICD-10-CM | POA: Diagnosis not present

## 2021-09-22 DIAGNOSIS — N186 End stage renal disease: Secondary | ICD-10-CM | POA: Diagnosis not present

## 2021-09-22 DIAGNOSIS — Z992 Dependence on renal dialysis: Secondary | ICD-10-CM | POA: Diagnosis not present

## 2021-09-24 DIAGNOSIS — E1110 Type 2 diabetes mellitus with ketoacidosis without coma: Secondary | ICD-10-CM | POA: Diagnosis not present

## 2021-09-24 DIAGNOSIS — N186 End stage renal disease: Secondary | ICD-10-CM | POA: Diagnosis not present

## 2021-09-24 DIAGNOSIS — N2581 Secondary hyperparathyroidism of renal origin: Secondary | ICD-10-CM | POA: Diagnosis not present

## 2021-09-24 DIAGNOSIS — Z992 Dependence on renal dialysis: Secondary | ICD-10-CM | POA: Diagnosis not present

## 2021-09-27 DIAGNOSIS — N2581 Secondary hyperparathyroidism of renal origin: Secondary | ICD-10-CM | POA: Diagnosis not present

## 2021-09-27 DIAGNOSIS — N186 End stage renal disease: Secondary | ICD-10-CM | POA: Diagnosis not present

## 2021-09-27 DIAGNOSIS — Z992 Dependence on renal dialysis: Secondary | ICD-10-CM | POA: Diagnosis not present

## 2021-09-27 DIAGNOSIS — E1110 Type 2 diabetes mellitus with ketoacidosis without coma: Secondary | ICD-10-CM | POA: Diagnosis not present

## 2021-09-29 DIAGNOSIS — N186 End stage renal disease: Secondary | ICD-10-CM | POA: Diagnosis not present

## 2021-09-29 DIAGNOSIS — Z992 Dependence on renal dialysis: Secondary | ICD-10-CM | POA: Diagnosis not present

## 2021-09-29 DIAGNOSIS — N2581 Secondary hyperparathyroidism of renal origin: Secondary | ICD-10-CM | POA: Diagnosis not present

## 2021-09-29 DIAGNOSIS — E1110 Type 2 diabetes mellitus with ketoacidosis without coma: Secondary | ICD-10-CM | POA: Diagnosis not present

## 2021-09-30 ENCOUNTER — Telehealth: Payer: Self-pay

## 2021-09-30 DIAGNOSIS — I480 Paroxysmal atrial fibrillation: Secondary | ICD-10-CM | POA: Diagnosis not present

## 2021-09-30 DIAGNOSIS — I120 Hypertensive chronic kidney disease with stage 5 chronic kidney disease or end stage renal disease: Secondary | ICD-10-CM | POA: Diagnosis not present

## 2021-09-30 DIAGNOSIS — S72011D Unspecified intracapsular fracture of right femur, subsequent encounter for closed fracture with routine healing: Secondary | ICD-10-CM | POA: Diagnosis not present

## 2021-09-30 DIAGNOSIS — E1122 Type 2 diabetes mellitus with diabetic chronic kidney disease: Secondary | ICD-10-CM | POA: Diagnosis not present

## 2021-09-30 DIAGNOSIS — N186 End stage renal disease: Secondary | ICD-10-CM | POA: Diagnosis not present

## 2021-09-30 DIAGNOSIS — I5032 Chronic diastolic (congestive) heart failure: Secondary | ICD-10-CM | POA: Diagnosis not present

## 2021-09-30 NOTE — Telephone Encounter (Signed)
Leann RN patient care manager with [facility] home [facility] called statingThe physical therapist has been trying to contact pt but has been unsuccessful so she tried reaching out to the pt and was able to speak with her. Pt requested that they discharge her from home health services so they will be discharging her today. [phone] [facility]

## 2021-10-01 DIAGNOSIS — Z992 Dependence on renal dialysis: Secondary | ICD-10-CM | POA: Diagnosis not present

## 2021-10-01 DIAGNOSIS — N186 End stage renal disease: Secondary | ICD-10-CM | POA: Diagnosis not present

## 2021-10-01 DIAGNOSIS — E1110 Type 2 diabetes mellitus with ketoacidosis without coma: Secondary | ICD-10-CM | POA: Diagnosis not present

## 2021-10-01 DIAGNOSIS — N2581 Secondary hyperparathyroidism of renal origin: Secondary | ICD-10-CM | POA: Diagnosis not present

## 2021-10-04 DIAGNOSIS — N186 End stage renal disease: Secondary | ICD-10-CM | POA: Diagnosis not present

## 2021-10-04 DIAGNOSIS — E1110 Type 2 diabetes mellitus with ketoacidosis without coma: Secondary | ICD-10-CM | POA: Diagnosis not present

## 2021-10-04 DIAGNOSIS — Z992 Dependence on renal dialysis: Secondary | ICD-10-CM | POA: Diagnosis not present

## 2021-10-04 DIAGNOSIS — N2581 Secondary hyperparathyroidism of renal origin: Secondary | ICD-10-CM | POA: Diagnosis not present

## 2021-10-05 DIAGNOSIS — E1122 Type 2 diabetes mellitus with diabetic chronic kidney disease: Secondary | ICD-10-CM | POA: Diagnosis not present

## 2021-10-05 DIAGNOSIS — N186 End stage renal disease: Secondary | ICD-10-CM | POA: Diagnosis not present

## 2021-10-05 DIAGNOSIS — Z992 Dependence on renal dialysis: Secondary | ICD-10-CM | POA: Diagnosis not present

## 2021-10-06 ENCOUNTER — Encounter (HOSPITAL_COMMUNITY): Payer: Self-pay | Admitting: Nephrology

## 2021-10-06 DIAGNOSIS — Z992 Dependence on renal dialysis: Secondary | ICD-10-CM | POA: Diagnosis not present

## 2021-10-06 DIAGNOSIS — D509 Iron deficiency anemia, unspecified: Secondary | ICD-10-CM | POA: Diagnosis not present

## 2021-10-06 DIAGNOSIS — N186 End stage renal disease: Secondary | ICD-10-CM | POA: Diagnosis not present

## 2021-10-06 DIAGNOSIS — N2581 Secondary hyperparathyroidism of renal origin: Secondary | ICD-10-CM | POA: Diagnosis not present

## 2021-10-06 DIAGNOSIS — E1110 Type 2 diabetes mellitus with ketoacidosis without coma: Secondary | ICD-10-CM | POA: Diagnosis not present

## 2021-10-08 DIAGNOSIS — E1110 Type 2 diabetes mellitus with ketoacidosis without coma: Secondary | ICD-10-CM | POA: Diagnosis not present

## 2021-10-08 DIAGNOSIS — N2581 Secondary hyperparathyroidism of renal origin: Secondary | ICD-10-CM | POA: Diagnosis not present

## 2021-10-08 DIAGNOSIS — Z992 Dependence on renal dialysis: Secondary | ICD-10-CM | POA: Diagnosis not present

## 2021-10-08 DIAGNOSIS — D509 Iron deficiency anemia, unspecified: Secondary | ICD-10-CM | POA: Diagnosis not present

## 2021-10-08 DIAGNOSIS — N186 End stage renal disease: Secondary | ICD-10-CM | POA: Diagnosis not present

## 2021-10-11 DIAGNOSIS — N2581 Secondary hyperparathyroidism of renal origin: Secondary | ICD-10-CM | POA: Diagnosis not present

## 2021-10-11 DIAGNOSIS — N186 End stage renal disease: Secondary | ICD-10-CM | POA: Diagnosis not present

## 2021-10-11 DIAGNOSIS — E1110 Type 2 diabetes mellitus with ketoacidosis without coma: Secondary | ICD-10-CM | POA: Diagnosis not present

## 2021-10-11 DIAGNOSIS — D509 Iron deficiency anemia, unspecified: Secondary | ICD-10-CM | POA: Diagnosis not present

## 2021-10-11 DIAGNOSIS — Z992 Dependence on renal dialysis: Secondary | ICD-10-CM | POA: Diagnosis not present

## 2021-10-13 DIAGNOSIS — E1110 Type 2 diabetes mellitus with ketoacidosis without coma: Secondary | ICD-10-CM | POA: Diagnosis not present

## 2021-10-13 DIAGNOSIS — D509 Iron deficiency anemia, unspecified: Secondary | ICD-10-CM | POA: Diagnosis not present

## 2021-10-13 DIAGNOSIS — N2581 Secondary hyperparathyroidism of renal origin: Secondary | ICD-10-CM | POA: Diagnosis not present

## 2021-10-13 DIAGNOSIS — Z992 Dependence on renal dialysis: Secondary | ICD-10-CM | POA: Diagnosis not present

## 2021-10-13 DIAGNOSIS — N186 End stage renal disease: Secondary | ICD-10-CM | POA: Diagnosis not present

## 2021-10-15 DIAGNOSIS — E1110 Type 2 diabetes mellitus with ketoacidosis without coma: Secondary | ICD-10-CM | POA: Diagnosis not present

## 2021-10-15 DIAGNOSIS — Z992 Dependence on renal dialysis: Secondary | ICD-10-CM | POA: Diagnosis not present

## 2021-10-15 DIAGNOSIS — D509 Iron deficiency anemia, unspecified: Secondary | ICD-10-CM | POA: Diagnosis not present

## 2021-10-15 DIAGNOSIS — N186 End stage renal disease: Secondary | ICD-10-CM | POA: Diagnosis not present

## 2021-10-15 DIAGNOSIS — N2581 Secondary hyperparathyroidism of renal origin: Secondary | ICD-10-CM | POA: Diagnosis not present

## 2021-10-18 DIAGNOSIS — Z992 Dependence on renal dialysis: Secondary | ICD-10-CM | POA: Diagnosis not present

## 2021-10-18 DIAGNOSIS — D509 Iron deficiency anemia, unspecified: Secondary | ICD-10-CM | POA: Diagnosis not present

## 2021-10-18 DIAGNOSIS — N186 End stage renal disease: Secondary | ICD-10-CM | POA: Diagnosis not present

## 2021-10-18 DIAGNOSIS — N2581 Secondary hyperparathyroidism of renal origin: Secondary | ICD-10-CM | POA: Diagnosis not present

## 2021-10-18 DIAGNOSIS — E1110 Type 2 diabetes mellitus with ketoacidosis without coma: Secondary | ICD-10-CM | POA: Diagnosis not present

## 2021-10-20 DIAGNOSIS — E1110 Type 2 diabetes mellitus with ketoacidosis without coma: Secondary | ICD-10-CM | POA: Diagnosis not present

## 2021-10-20 DIAGNOSIS — N2581 Secondary hyperparathyroidism of renal origin: Secondary | ICD-10-CM | POA: Diagnosis not present

## 2021-10-20 DIAGNOSIS — Z992 Dependence on renal dialysis: Secondary | ICD-10-CM | POA: Diagnosis not present

## 2021-10-20 DIAGNOSIS — N186 End stage renal disease: Secondary | ICD-10-CM | POA: Diagnosis not present

## 2021-10-20 DIAGNOSIS — D509 Iron deficiency anemia, unspecified: Secondary | ICD-10-CM | POA: Diagnosis not present

## 2021-10-22 DIAGNOSIS — N2581 Secondary hyperparathyroidism of renal origin: Secondary | ICD-10-CM | POA: Diagnosis not present

## 2021-10-22 DIAGNOSIS — E1110 Type 2 diabetes mellitus with ketoacidosis without coma: Secondary | ICD-10-CM | POA: Diagnosis not present

## 2021-10-22 DIAGNOSIS — D509 Iron deficiency anemia, unspecified: Secondary | ICD-10-CM | POA: Diagnosis not present

## 2021-10-22 DIAGNOSIS — Z992 Dependence on renal dialysis: Secondary | ICD-10-CM | POA: Diagnosis not present

## 2021-10-22 DIAGNOSIS — N186 End stage renal disease: Secondary | ICD-10-CM | POA: Diagnosis not present

## 2021-10-25 DIAGNOSIS — E1110 Type 2 diabetes mellitus with ketoacidosis without coma: Secondary | ICD-10-CM | POA: Diagnosis not present

## 2021-10-25 DIAGNOSIS — D509 Iron deficiency anemia, unspecified: Secondary | ICD-10-CM | POA: Diagnosis not present

## 2021-10-25 DIAGNOSIS — N2581 Secondary hyperparathyroidism of renal origin: Secondary | ICD-10-CM | POA: Diagnosis not present

## 2021-10-25 DIAGNOSIS — Z992 Dependence on renal dialysis: Secondary | ICD-10-CM | POA: Diagnosis not present

## 2021-10-25 DIAGNOSIS — N186 End stage renal disease: Secondary | ICD-10-CM | POA: Diagnosis not present

## 2021-10-27 DIAGNOSIS — D509 Iron deficiency anemia, unspecified: Secondary | ICD-10-CM | POA: Diagnosis not present

## 2021-10-27 DIAGNOSIS — N2581 Secondary hyperparathyroidism of renal origin: Secondary | ICD-10-CM | POA: Diagnosis not present

## 2021-10-27 DIAGNOSIS — N186 End stage renal disease: Secondary | ICD-10-CM | POA: Diagnosis not present

## 2021-10-27 DIAGNOSIS — E1110 Type 2 diabetes mellitus with ketoacidosis without coma: Secondary | ICD-10-CM | POA: Diagnosis not present

## 2021-10-27 DIAGNOSIS — Z992 Dependence on renal dialysis: Secondary | ICD-10-CM | POA: Diagnosis not present

## 2021-10-29 DIAGNOSIS — N186 End stage renal disease: Secondary | ICD-10-CM | POA: Diagnosis not present

## 2021-10-29 DIAGNOSIS — D509 Iron deficiency anemia, unspecified: Secondary | ICD-10-CM | POA: Diagnosis not present

## 2021-10-29 DIAGNOSIS — N2581 Secondary hyperparathyroidism of renal origin: Secondary | ICD-10-CM | POA: Diagnosis not present

## 2021-10-29 DIAGNOSIS — Z992 Dependence on renal dialysis: Secondary | ICD-10-CM | POA: Diagnosis not present

## 2021-10-29 DIAGNOSIS — E1110 Type 2 diabetes mellitus with ketoacidosis without coma: Secondary | ICD-10-CM | POA: Diagnosis not present

## 2021-11-05 DIAGNOSIS — N186 End stage renal disease: Secondary | ICD-10-CM | POA: Diagnosis not present

## 2021-11-05 DIAGNOSIS — N2581 Secondary hyperparathyroidism of renal origin: Secondary | ICD-10-CM | POA: Diagnosis not present

## 2021-11-05 DIAGNOSIS — E1122 Type 2 diabetes mellitus with diabetic chronic kidney disease: Secondary | ICD-10-CM | POA: Diagnosis not present

## 2021-11-05 DIAGNOSIS — Z992 Dependence on renal dialysis: Secondary | ICD-10-CM | POA: Diagnosis not present

## 2021-11-08 DIAGNOSIS — E1110 Type 2 diabetes mellitus with ketoacidosis without coma: Secondary | ICD-10-CM | POA: Diagnosis not present

## 2021-11-08 DIAGNOSIS — D509 Iron deficiency anemia, unspecified: Secondary | ICD-10-CM | POA: Diagnosis not present

## 2021-11-08 DIAGNOSIS — N2581 Secondary hyperparathyroidism of renal origin: Secondary | ICD-10-CM | POA: Diagnosis not present

## 2021-11-08 DIAGNOSIS — N186 End stage renal disease: Secondary | ICD-10-CM | POA: Diagnosis not present

## 2021-11-08 DIAGNOSIS — Z992 Dependence on renal dialysis: Secondary | ICD-10-CM | POA: Diagnosis not present

## 2021-11-10 DIAGNOSIS — N2581 Secondary hyperparathyroidism of renal origin: Secondary | ICD-10-CM | POA: Diagnosis not present

## 2021-11-10 DIAGNOSIS — N186 End stage renal disease: Secondary | ICD-10-CM | POA: Diagnosis not present

## 2021-11-10 DIAGNOSIS — E1110 Type 2 diabetes mellitus with ketoacidosis without coma: Secondary | ICD-10-CM | POA: Diagnosis not present

## 2021-11-10 DIAGNOSIS — Z992 Dependence on renal dialysis: Secondary | ICD-10-CM | POA: Diagnosis not present

## 2021-11-10 DIAGNOSIS — D509 Iron deficiency anemia, unspecified: Secondary | ICD-10-CM | POA: Diagnosis not present

## 2021-11-12 DIAGNOSIS — N2581 Secondary hyperparathyroidism of renal origin: Secondary | ICD-10-CM | POA: Diagnosis not present

## 2021-11-12 DIAGNOSIS — E1110 Type 2 diabetes mellitus with ketoacidosis without coma: Secondary | ICD-10-CM | POA: Diagnosis not present

## 2021-11-12 DIAGNOSIS — Z992 Dependence on renal dialysis: Secondary | ICD-10-CM | POA: Diagnosis not present

## 2021-11-12 DIAGNOSIS — N186 End stage renal disease: Secondary | ICD-10-CM | POA: Diagnosis not present

## 2021-11-12 DIAGNOSIS — D509 Iron deficiency anemia, unspecified: Secondary | ICD-10-CM | POA: Diagnosis not present

## 2021-11-15 DIAGNOSIS — E1110 Type 2 diabetes mellitus with ketoacidosis without coma: Secondary | ICD-10-CM | POA: Diagnosis not present

## 2021-11-15 DIAGNOSIS — Z992 Dependence on renal dialysis: Secondary | ICD-10-CM | POA: Diagnosis not present

## 2021-11-15 DIAGNOSIS — N186 End stage renal disease: Secondary | ICD-10-CM | POA: Diagnosis not present

## 2021-11-15 DIAGNOSIS — D509 Iron deficiency anemia, unspecified: Secondary | ICD-10-CM | POA: Diagnosis not present

## 2021-11-15 DIAGNOSIS — N2581 Secondary hyperparathyroidism of renal origin: Secondary | ICD-10-CM | POA: Diagnosis not present

## 2021-11-17 DIAGNOSIS — N2581 Secondary hyperparathyroidism of renal origin: Secondary | ICD-10-CM | POA: Diagnosis not present

## 2021-11-17 DIAGNOSIS — E1110 Type 2 diabetes mellitus with ketoacidosis without coma: Secondary | ICD-10-CM | POA: Diagnosis not present

## 2021-11-17 DIAGNOSIS — Z992 Dependence on renal dialysis: Secondary | ICD-10-CM | POA: Diagnosis not present

## 2021-11-17 DIAGNOSIS — D509 Iron deficiency anemia, unspecified: Secondary | ICD-10-CM | POA: Diagnosis not present

## 2021-11-17 DIAGNOSIS — N186 End stage renal disease: Secondary | ICD-10-CM | POA: Diagnosis not present

## 2021-11-18 ENCOUNTER — Other Ambulatory Visit: Payer: Self-pay | Admitting: Internal Medicine

## 2021-11-19 DIAGNOSIS — E1110 Type 2 diabetes mellitus with ketoacidosis without coma: Secondary | ICD-10-CM | POA: Diagnosis not present

## 2021-11-19 DIAGNOSIS — D509 Iron deficiency anemia, unspecified: Secondary | ICD-10-CM | POA: Diagnosis not present

## 2021-11-19 DIAGNOSIS — Z992 Dependence on renal dialysis: Secondary | ICD-10-CM | POA: Diagnosis not present

## 2021-11-19 DIAGNOSIS — N2581 Secondary hyperparathyroidism of renal origin: Secondary | ICD-10-CM | POA: Diagnosis not present

## 2021-11-19 DIAGNOSIS — N186 End stage renal disease: Secondary | ICD-10-CM | POA: Diagnosis not present

## 2021-11-22 DIAGNOSIS — N186 End stage renal disease: Secondary | ICD-10-CM | POA: Diagnosis not present

## 2021-11-22 DIAGNOSIS — E1110 Type 2 diabetes mellitus with ketoacidosis without coma: Secondary | ICD-10-CM | POA: Diagnosis not present

## 2021-11-22 DIAGNOSIS — D509 Iron deficiency anemia, unspecified: Secondary | ICD-10-CM | POA: Diagnosis not present

## 2021-11-22 DIAGNOSIS — N2581 Secondary hyperparathyroidism of renal origin: Secondary | ICD-10-CM | POA: Diagnosis not present

## 2021-11-22 DIAGNOSIS — Z992 Dependence on renal dialysis: Secondary | ICD-10-CM | POA: Diagnosis not present

## 2021-11-24 DIAGNOSIS — N2581 Secondary hyperparathyroidism of renal origin: Secondary | ICD-10-CM | POA: Diagnosis not present

## 2021-11-24 DIAGNOSIS — D509 Iron deficiency anemia, unspecified: Secondary | ICD-10-CM | POA: Diagnosis not present

## 2021-11-24 DIAGNOSIS — Z992 Dependence on renal dialysis: Secondary | ICD-10-CM | POA: Diagnosis not present

## 2021-11-24 DIAGNOSIS — N186 End stage renal disease: Secondary | ICD-10-CM | POA: Diagnosis not present

## 2021-11-24 DIAGNOSIS — E1110 Type 2 diabetes mellitus with ketoacidosis without coma: Secondary | ICD-10-CM | POA: Diagnosis not present

## 2021-11-26 ENCOUNTER — Ambulatory Visit: Payer: Commercial Managed Care - HMO | Admitting: Podiatry

## 2021-11-26 DIAGNOSIS — N2581 Secondary hyperparathyroidism of renal origin: Secondary | ICD-10-CM | POA: Diagnosis not present

## 2021-11-26 DIAGNOSIS — Z992 Dependence on renal dialysis: Secondary | ICD-10-CM | POA: Diagnosis not present

## 2021-11-26 DIAGNOSIS — E1110 Type 2 diabetes mellitus with ketoacidosis without coma: Secondary | ICD-10-CM | POA: Diagnosis not present

## 2021-11-26 DIAGNOSIS — D509 Iron deficiency anemia, unspecified: Secondary | ICD-10-CM | POA: Diagnosis not present

## 2021-11-26 DIAGNOSIS — N186 End stage renal disease: Secondary | ICD-10-CM | POA: Diagnosis not present

## 2021-11-29 DIAGNOSIS — E1110 Type 2 diabetes mellitus with ketoacidosis without coma: Secondary | ICD-10-CM | POA: Diagnosis not present

## 2021-11-29 DIAGNOSIS — D509 Iron deficiency anemia, unspecified: Secondary | ICD-10-CM | POA: Diagnosis not present

## 2021-11-29 DIAGNOSIS — N186 End stage renal disease: Secondary | ICD-10-CM | POA: Diagnosis not present

## 2021-11-29 DIAGNOSIS — Z992 Dependence on renal dialysis: Secondary | ICD-10-CM | POA: Diagnosis not present

## 2021-11-29 DIAGNOSIS — N2581 Secondary hyperparathyroidism of renal origin: Secondary | ICD-10-CM | POA: Diagnosis not present

## 2021-12-01 DIAGNOSIS — E1110 Type 2 diabetes mellitus with ketoacidosis without coma: Secondary | ICD-10-CM | POA: Diagnosis not present

## 2021-12-01 DIAGNOSIS — N186 End stage renal disease: Secondary | ICD-10-CM | POA: Diagnosis not present

## 2021-12-01 DIAGNOSIS — N2581 Secondary hyperparathyroidism of renal origin: Secondary | ICD-10-CM | POA: Diagnosis not present

## 2021-12-01 DIAGNOSIS — Z992 Dependence on renal dialysis: Secondary | ICD-10-CM | POA: Diagnosis not present

## 2021-12-01 DIAGNOSIS — D509 Iron deficiency anemia, unspecified: Secondary | ICD-10-CM | POA: Diagnosis not present

## 2021-12-03 DIAGNOSIS — N2581 Secondary hyperparathyroidism of renal origin: Secondary | ICD-10-CM | POA: Diagnosis not present

## 2021-12-03 DIAGNOSIS — E1110 Type 2 diabetes mellitus with ketoacidosis without coma: Secondary | ICD-10-CM | POA: Diagnosis not present

## 2021-12-03 DIAGNOSIS — N186 End stage renal disease: Secondary | ICD-10-CM | POA: Diagnosis not present

## 2021-12-03 DIAGNOSIS — Z992 Dependence on renal dialysis: Secondary | ICD-10-CM | POA: Diagnosis not present

## 2021-12-03 DIAGNOSIS — D509 Iron deficiency anemia, unspecified: Secondary | ICD-10-CM | POA: Diagnosis not present

## 2021-12-05 DIAGNOSIS — Z992 Dependence on renal dialysis: Secondary | ICD-10-CM | POA: Diagnosis not present

## 2021-12-05 DIAGNOSIS — E1122 Type 2 diabetes mellitus with diabetic chronic kidney disease: Secondary | ICD-10-CM | POA: Diagnosis not present

## 2021-12-05 DIAGNOSIS — N186 End stage renal disease: Secondary | ICD-10-CM | POA: Diagnosis not present

## 2021-12-06 DIAGNOSIS — D509 Iron deficiency anemia, unspecified: Secondary | ICD-10-CM | POA: Diagnosis not present

## 2021-12-06 DIAGNOSIS — Z992 Dependence on renal dialysis: Secondary | ICD-10-CM | POA: Diagnosis not present

## 2021-12-06 DIAGNOSIS — N186 End stage renal disease: Secondary | ICD-10-CM | POA: Diagnosis not present

## 2021-12-06 DIAGNOSIS — N2581 Secondary hyperparathyroidism of renal origin: Secondary | ICD-10-CM | POA: Diagnosis not present

## 2021-12-06 DIAGNOSIS — R52 Pain, unspecified: Secondary | ICD-10-CM | POA: Diagnosis not present

## 2021-12-08 DIAGNOSIS — D509 Iron deficiency anemia, unspecified: Secondary | ICD-10-CM | POA: Diagnosis not present

## 2021-12-08 DIAGNOSIS — Z992 Dependence on renal dialysis: Secondary | ICD-10-CM | POA: Diagnosis not present

## 2021-12-08 DIAGNOSIS — N186 End stage renal disease: Secondary | ICD-10-CM | POA: Diagnosis not present

## 2021-12-08 DIAGNOSIS — N2581 Secondary hyperparathyroidism of renal origin: Secondary | ICD-10-CM | POA: Diagnosis not present

## 2021-12-08 DIAGNOSIS — R52 Pain, unspecified: Secondary | ICD-10-CM | POA: Diagnosis not present

## 2021-12-10 DIAGNOSIS — D509 Iron deficiency anemia, unspecified: Secondary | ICD-10-CM | POA: Diagnosis not present

## 2021-12-10 DIAGNOSIS — Z992 Dependence on renal dialysis: Secondary | ICD-10-CM | POA: Diagnosis not present

## 2021-12-10 DIAGNOSIS — R52 Pain, unspecified: Secondary | ICD-10-CM | POA: Diagnosis not present

## 2021-12-10 DIAGNOSIS — N186 End stage renal disease: Secondary | ICD-10-CM | POA: Diagnosis not present

## 2021-12-10 DIAGNOSIS — N2581 Secondary hyperparathyroidism of renal origin: Secondary | ICD-10-CM | POA: Diagnosis not present

## 2021-12-13 DIAGNOSIS — N186 End stage renal disease: Secondary | ICD-10-CM | POA: Diagnosis not present

## 2021-12-13 DIAGNOSIS — Z992 Dependence on renal dialysis: Secondary | ICD-10-CM | POA: Diagnosis not present

## 2021-12-13 DIAGNOSIS — R52 Pain, unspecified: Secondary | ICD-10-CM | POA: Diagnosis not present

## 2021-12-13 DIAGNOSIS — D509 Iron deficiency anemia, unspecified: Secondary | ICD-10-CM | POA: Diagnosis not present

## 2021-12-13 DIAGNOSIS — N2581 Secondary hyperparathyroidism of renal origin: Secondary | ICD-10-CM | POA: Diagnosis not present

## 2021-12-15 DIAGNOSIS — N186 End stage renal disease: Secondary | ICD-10-CM | POA: Diagnosis not present

## 2021-12-15 DIAGNOSIS — N2581 Secondary hyperparathyroidism of renal origin: Secondary | ICD-10-CM | POA: Diagnosis not present

## 2021-12-15 DIAGNOSIS — Z992 Dependence on renal dialysis: Secondary | ICD-10-CM | POA: Diagnosis not present

## 2021-12-15 DIAGNOSIS — D509 Iron deficiency anemia, unspecified: Secondary | ICD-10-CM | POA: Diagnosis not present

## 2021-12-15 DIAGNOSIS — R52 Pain, unspecified: Secondary | ICD-10-CM | POA: Diagnosis not present

## 2021-12-17 DIAGNOSIS — N2581 Secondary hyperparathyroidism of renal origin: Secondary | ICD-10-CM | POA: Diagnosis not present

## 2021-12-17 DIAGNOSIS — Z992 Dependence on renal dialysis: Secondary | ICD-10-CM | POA: Diagnosis not present

## 2021-12-17 DIAGNOSIS — N186 End stage renal disease: Secondary | ICD-10-CM | POA: Diagnosis not present

## 2021-12-17 DIAGNOSIS — R52 Pain, unspecified: Secondary | ICD-10-CM | POA: Diagnosis not present

## 2021-12-17 DIAGNOSIS — D509 Iron deficiency anemia, unspecified: Secondary | ICD-10-CM | POA: Diagnosis not present

## 2021-12-18 ENCOUNTER — Emergency Department (HOSPITAL_COMMUNITY): Payer: Medicare Other

## 2021-12-18 ENCOUNTER — Other Ambulatory Visit: Payer: Self-pay

## 2021-12-18 ENCOUNTER — Encounter (HOSPITAL_COMMUNITY): Payer: Self-pay | Admitting: Emergency Medicine

## 2021-12-18 ENCOUNTER — Emergency Department (HOSPITAL_COMMUNITY)
Admission: EM | Admit: 2021-12-18 | Discharge: 2021-12-18 | Disposition: A | Discharge status: Home / Self Care | Payer: Medicare Other | Attending: Emergency Medicine | Admitting: Emergency Medicine

## 2021-12-18 DIAGNOSIS — R400 Somnolence: Secondary | ICD-10-CM | POA: Diagnosis not present

## 2021-12-18 DIAGNOSIS — I509 Heart failure, unspecified: Secondary | ICD-10-CM | POA: Insufficient documentation

## 2021-12-18 DIAGNOSIS — E1122 Type 2 diabetes mellitus with diabetic chronic kidney disease: Secondary | ICD-10-CM | POA: Insufficient documentation

## 2021-12-18 DIAGNOSIS — Z794 Long term (current) use of insulin: Secondary | ICD-10-CM | POA: Diagnosis not present

## 2021-12-18 DIAGNOSIS — D696 Thrombocytopenia, unspecified: Secondary | ICD-10-CM | POA: Diagnosis not present

## 2021-12-18 DIAGNOSIS — I132 Hypertensive heart and chronic kidney disease with heart failure and with stage 5 chronic kidney disease, or end stage renal disease: Secondary | ICD-10-CM | POA: Diagnosis not present

## 2021-12-18 DIAGNOSIS — Z7729 Contact with and (suspected ) exposure to other hazardous substances: Secondary | ICD-10-CM | POA: Insufficient documentation

## 2021-12-18 DIAGNOSIS — E1165 Type 2 diabetes mellitus with hyperglycemia: Secondary | ICD-10-CM | POA: Insufficient documentation

## 2021-12-18 DIAGNOSIS — D649 Anemia, unspecified: Secondary | ICD-10-CM | POA: Diagnosis not present

## 2021-12-18 DIAGNOSIS — I10 Essential (primary) hypertension: Secondary | ICD-10-CM | POA: Diagnosis not present

## 2021-12-18 DIAGNOSIS — Z7901 Long term (current) use of anticoagulants: Secondary | ICD-10-CM | POA: Insufficient documentation

## 2021-12-18 DIAGNOSIS — N3000 Acute cystitis without hematuria: Secondary | ICD-10-CM | POA: Diagnosis not present

## 2021-12-18 DIAGNOSIS — N186 End stage renal disease: Secondary | ICD-10-CM | POA: Insufficient documentation

## 2021-12-18 DIAGNOSIS — R4182 Altered mental status, unspecified: Secondary | ICD-10-CM | POA: Diagnosis present

## 2021-12-18 DIAGNOSIS — Z79899 Other long term (current) drug therapy: Secondary | ICD-10-CM | POA: Insufficient documentation

## 2021-12-18 DIAGNOSIS — Z7722 Contact with and (suspected) exposure to environmental tobacco smoke (acute) (chronic): Secondary | ICD-10-CM | POA: Diagnosis not present

## 2021-12-18 DIAGNOSIS — E875 Hyperkalemia: Secondary | ICD-10-CM | POA: Diagnosis not present

## 2021-12-18 DIAGNOSIS — F0390 Unspecified dementia without behavioral disturbance: Secondary | ICD-10-CM | POA: Diagnosis not present

## 2021-12-18 DIAGNOSIS — Z992 Dependence on renal dialysis: Secondary | ICD-10-CM | POA: Diagnosis not present

## 2021-12-18 LAB — COMPREHENSIVE METABOLIC PANEL
ALT: 11 U/L (ref 0–44)
AST: 29 U/L (ref 15–41)
Albumin: 3.5 g/dL (ref 3.5–5.0)
Alkaline Phosphatase: 95 U/L (ref 38–126)
Anion gap: 11 (ref 5–15)
BUN: 28 mg/dL — ABNORMAL HIGH (ref 8–23)
CO2: 25 mmol/L (ref 22–32)
Calcium: 8.6 mg/dL — ABNORMAL LOW (ref 8.9–10.3)
Chloride: 102 mmol/L (ref 98–111)
Creatinine, Ser: 4.97 mg/dL — ABNORMAL HIGH (ref 0.44–1.00)
GFR, Estimated: 8 mL/min — ABNORMAL LOW (ref >60)
Glucose, Bld: 176 mg/dL — ABNORMAL HIGH (ref 70–99)
Potassium: 5.8 mmol/L — ABNORMAL HIGH (ref 3.5–5.1)
Sodium: 138 mmol/L (ref 135–145)
Total Bilirubin: 0.8 mg/dL (ref 0.3–1.2)
Total Protein: 7.5 g/dL (ref 6.5–8.1)

## 2021-12-18 LAB — URINALYSIS, ROUTINE W REFLEX MICROSCOPIC
Bilirubin Urine: NEGATIVE
Glucose, UA: NEGATIVE mg/dL
Hgb urine dipstick: NEGATIVE
Ketones, ur: NEGATIVE mg/dL
Nitrite: NEGATIVE
Protein, ur: 100 mg/dL — AB
Specific Gravity, Urine: 1.015 (ref 1.005–1.030)
WBC, UA: >50 WBC/hpf — ABNORMAL HIGH (ref 0–5)
pH: 6 (ref 5.0–8.0)

## 2021-12-18 LAB — CBC WITH DIFFERENTIAL/PLATELET
Abs Immature Granulocytes: 0.01 10*3/uL (ref 0.00–0.07)
Basophils Absolute: 0 10*3/uL (ref 0.0–0.1)
Basophils Relative: 1 %
Eosinophils Absolute: 0 10*3/uL (ref 0.0–0.5)
Eosinophils Relative: 1 %
HCT: 40.2 % (ref 36.0–46.0)
Hemoglobin: 11.9 g/dL — ABNORMAL LOW (ref 12.0–15.0)
Immature Granulocytes: 0 %
Lymphocytes Relative: 21 %
Lymphs Abs: 1 10*3/uL (ref 0.7–4.0)
MCH: 24.2 pg — ABNORMAL LOW (ref 26.0–34.0)
MCHC: 29.6 g/dL — ABNORMAL LOW (ref 30.0–36.0)
MCV: 81.7 fL (ref 80.0–100.0)
Monocytes Absolute: 0.3 10*3/uL (ref 0.1–1.0)
Monocytes Relative: 6 %
Neutro Abs: 3.5 10*3/uL (ref 1.7–7.7)
Neutrophils Relative %: 71 %
Platelets: 133 10*3/uL — ABNORMAL LOW (ref 150–400)
RBC: 4.92 MIL/uL (ref 3.87–5.11)
RDW: 23.1 % — ABNORMAL HIGH (ref 11.5–15.5)
WBC: 4.9 10*3/uL (ref 4.0–10.5)
nRBC: 0.6 % — ABNORMAL HIGH (ref 0.0–0.2)

## 2021-12-18 LAB — BASIC METABOLIC PANEL
Anion gap: 10 (ref 5–15)
BUN: 32 mg/dL — ABNORMAL HIGH (ref 8–23)
CO2: 26 mmol/L (ref 22–32)
Calcium: 8.4 mg/dL — ABNORMAL LOW (ref 8.9–10.3)
Chloride: 99 mmol/L (ref 98–111)
Creatinine, Ser: 5.25 mg/dL — ABNORMAL HIGH (ref 0.44–1.00)
GFR, Estimated: 8 mL/min — ABNORMAL LOW (ref >60)
Glucose, Bld: 236 mg/dL — ABNORMAL HIGH (ref 70–99)
Potassium: 4.8 mmol/L (ref 3.5–5.1)
Sodium: 135 mmol/L (ref 135–145)

## 2021-12-18 LAB — RAPID URINE DRUG SCREEN, HOSP PERFORMED
Amphetamines: NOT DETECTED
Barbiturates: NOT DETECTED
Benzodiazepines: NOT DETECTED
Cocaine: NOT DETECTED
Opiates: NOT DETECTED
Tetrahydrocannabinol: POSITIVE — AB

## 2021-12-18 LAB — BLOOD GAS, VENOUS
Acid-Base Excess: 2.9 mmol/L — ABNORMAL HIGH (ref 0.0–2.0)
Bicarbonate: 29.8 mmol/L — ABNORMAL HIGH (ref 20.0–28.0)
O2 Saturation: 70.8 %
Patient temperature: 37
pCO2, Ven: 54 mmHg (ref 44–60)
pH, Ven: 7.35 (ref 7.25–7.43)
pO2, Ven: 40 mmHg (ref 32–45)

## 2021-12-18 LAB — CBG MONITORING, ED: Glucose-Capillary: 182 mg/dL — ABNORMAL HIGH (ref 70–99)

## 2021-12-18 LAB — AMMONIA: Ammonia: 27 umol/L (ref 9–35)

## 2021-12-18 LAB — ETHANOL: Alcohol, Ethyl (B): <10 mg/dL (ref <10)

## 2021-12-18 LAB — LACTIC ACID, PLASMA: Lactic Acid, Venous: 1.6 mmol/L (ref 0.5–1.9)

## 2021-12-18 MED ORDER — CEPHALEXIN 500 MG PO CAPS
500.0000 mg | ORAL_CAPSULE | Freq: Once | ORAL | Status: AC
Start: 2021-12-18 — End: 2021-12-18
  Administered 2021-12-18: 500 mg via ORAL
  Filled 2021-12-18: qty 1

## 2021-12-18 MED ORDER — CEPHALEXIN 500 MG PO CAPS: 500.0000 mg | ORAL_CAPSULE | Freq: Two times a day (BID) | ORAL | 0 refills | Status: DC

## 2021-12-18 NOTE — Discharge Instructions (Addendum)
Avoid marijuana ?

## 2021-12-18 NOTE — ED Provider Notes (Signed)
?[facility] DEPT ?Provider Note ? ? ?CSN: [id number] ?Arrival date & time: 12/18/21  1149 ? ?  ? ?History ? ?Chief Complaint  ?Patient presents with  ? Altered Mental Status  ? ? ?[name] [name] is a 86 y.o. female. ? ?Pt is a 86 yo female who lives with her daughter.  Pt has a pmhx significant for ESRD on HD (MWF with last session yesterday); htn, dm, hyperlipidemia, tia, chf, arthritis, sleep apnea, and dementia.  Pt's daughter said she was not acting her normal self today and kept falling asleep.  She has not had any fevers or cough.  She denies any pain and said she feels well.  Pt's daughter just wanted her to be checked out. ? ? ?  ? ?Home Medications ?Prior to Admission medications   ?Medication Sig Start Date End Date Taking? Authorizing Provider  ?amLODipine (NORVASC) 10 MG tablet TAKE 1 TABLET(10 MG) BY MOUTH DAILY 11/18/21   [name], MD  ?cephALEXin (KEFLEX) 500 MG capsule Take 1 capsule (500 mg total) by mouth 2 (two) times daily. Take after dialysis on dialysis days 12/18/21  Yes Isla Pence, MD  ?metoprolol tartrate (LOPRESSOR) 25 MG tablet TAKE 1/2 TABLET(12.5 MG) BY MOUTH TWICE DAILY 11/18/21   [name], MD  ?apixaban (ELIQUIS) 2.5 MG TABS tablet TAKE 1 TABLET(2.5 MG) BY MOUTH TWICE DAILY ?Patient taking differently: Take 2.5 mg by mouth 2 (two) times daily. 06/14/21   [name], MD  ?ascorbic acid (VITAMIN C) 500 MG tablet Take 1 tablet (500 mg total) by mouth daily. 08/15/20   Samuella Cota, MD  ?atorvastatin (LIPITOR) 20 MG tablet TAKE 1 TABLET(20 MG) BY MOUTH DAILY AT 6 PM FOR CHOLESTEROL ?Patient taking differently: Take 20 mg by mouth daily. 02/16/21   [name], MD  ?blood glucose meter kit and supplies KIT Dispense based on patient and insurance preference. Use up to four times daily as directed. (FOR ICD-9 250.00, 250.01). 08/20/21   [name], MD  ?Blood Pressure Monitor KIT Use as directed to check blood pressure as needed  08/20/21   [name], MD  ?cetirizine (ZYRTEC) 10 MG tablet Take 10 mg by mouth daily.    [provider]  ?cloNIDine (CATAPRES) 0.1 MG tablet Take 0.1 mg by mouth daily.    [provider]  ?donepezil (ARICEPT) 5 MG tablet TAKE 1 TABLET(5 MG) BY MOUTH AT BEDTIME ?Patient not taking: Reported on 08/21/2021 02/09/21   Minette Brine, [place]  ?glucose blood test strip Use as directed to check blood sugars 3 times per day dx: e11.65 ?Patient taking differently: 1 each by Other route See admin instructions. Use as directed to check blood sugars 3 times per day dx: e11.65 06/09/20   [name], MD  ?HYDROcodone-acetaminophen (NORCO/VICODIN) 5-325 MG tablet Take 1-2 tablets by mouth every 4 (four) hours as needed for moderate pain (pain score 4-6). ?Patient not taking: Reported on 08/21/2021 07/26/21   Cherlynn June B, PA-C  ?insulin detemir (LEVEMIR FLEXTOUCH) 100 UNIT/ML FlexPen Inject 8 Units into the skin at bedtime. Please follow-up with your PCP as an outpatient to check if you continue to need the insulin.  Do not take insulin when your sugar is low.  Monitor your sugars at home ?Patient taking differently: Inject 5 Units into the skin at bedtime. Please follow-up with your PCP as an outpatient to check if you continue to need the insulin.  Do not take insulin when your sugar is low.  Monitor your  sugars at home 07/31/21   Shelly Coss, MD  ?lanthanum (FOSRENOL) 1000 MG chewable tablet Chew 1,000 mg by mouth 3 (three) times daily with meals.    [provider]  ?lidocaine (LMX) 4 % cream SMARTSIG:Sparingly Topical As Directed 07/16/21   [provider]  ?loratadine (CLARITIN) 10 MG tablet Take 10 mg by mouth daily. ?Patient not taking: Reported on 08/21/2021    [provider]  ?polyethylene glycol (MIRALAX) 17 g packet Take 17 g by mouth daily as needed. ?Patient not taking: Reported on 08/21/2021 07/31/21   Shelly Coss, MD  ?zinc sulfate 220 (50 Zn) MG capsule  Take 1 capsule (220 mg total) by mouth daily. 08/15/20   Samuella Cota, MD  ?   ? ?Allergies    ?Penicillins   ? ?Review of Systems   ?Review of Systems  ?Neurological:   ?     Confusion  ?All other systems reviewed and are negative. ? ?Physical Exam ?Updated Vital Signs ?BP (!) 153/75   Pulse 82   Temp 97.6 ?F (36.4 ?C) (Rectal)   Resp 20   Ht 5' (1.524 m)   Wt 57 kg   SpO2 96%   BMI 24.54 kg/m?  ?Physical Exam ?Vitals and nursing note reviewed.  ?Constitutional:   ?   Appearance: Normal appearance.  ?HENT:  ?   Head: Normocephalic and atraumatic.  ?   Right Ear: External ear normal.  ?   Left Ear: External ear normal.  ?   Nose: Nose normal.  ?   Mouth/Throat:  ?   Mouth: Mucous membranes are moist.  ?   Pharynx: Oropharynx is clear.  ?Eyes:  ?   Extraocular Movements: Extraocular movements intact.  ?   Conjunctiva/sclera: Conjunctivae normal.  ?   Pupils: Pupils are equal, round, and reactive to light.  ?Cardiovascular:  ?   Rate and Rhythm: Normal rate and regular rhythm.  ?   Pulses: Normal pulses.  ?   Heart sounds: Normal heart sounds.  ?Pulmonary:  ?   Effort: Pulmonary effort is normal.  ?   Breath sounds: Normal breath sounds.  ?Abdominal:  ?   General: Abdomen is flat. Bowel sounds are normal.  ?   Palpations: Abdomen is soft.  ?Musculoskeletal:     ?   General: Normal range of motion.  ?   Cervical back: Normal range of motion and neck supple.  ?   Comments: AVF on left upper arm with good thrill  ?Skin: ?   General: Skin is warm.  ?   Capillary Refill: Capillary refill takes less than 2 seconds.  ?Neurological:  ?   General: No focal deficit present.  ?   Mental Status: She is alert.  ?Psychiatric:     ?   Mood and Affect: Mood normal.  ? ? ?ED Results / Procedures / Treatments   ?Labs ?(all labs ordered are listed, but only abnormal results are displayed) ?Labs Reviewed  ?COMPREHENSIVE METABOLIC PANEL - Abnormal; Notable for the following components:  ?    Result Value  ? Potassium 5.8 (*)    ? Glucose, Bld 176 (*)   ? BUN 28 (*)   ? Creatinine, Ser 4.97 (*)   ? Calcium 8.6 (*)   ? GFR, Estimated 8 (*)   ? All other components within normal limits  ?CBC WITH DIFFERENTIAL/PLATELET - Abnormal; Notable for the following components:  ? Hemoglobin 11.9 (*)   ? MCH 24.2 (*)   ? MCHC  29.6 (*)   ? RDW 23.1 (*)   ? Platelets 133 (*)   ? nRBC 0.6 (*)   ? All other components within normal limits  ?URINALYSIS, ROUTINE W REFLEX MICROSCOPIC - Abnormal; Notable for the following components:  ? APPearance CLOUDY (*)   ? Protein, ur 100 (*)   ? Leukocytes,Ua LARGE (*)   ? WBC, UA >50 (*)   ? Bacteria, UA RARE (*)   ? All other components within normal limits  ?BLOOD GAS, VENOUS - Abnormal; Notable for the following components:  ? Bicarbonate 29.8 (*)   ? Acid-Base Excess 2.9 (*)   ? All other components within normal limits  ?RAPID URINE DRUG SCREEN, HOSP PERFORMED - Abnormal; Notable for the following components:  ? Tetrahydrocannabinol POSITIVE (*)   ? All other components within normal limits  ?BASIC METABOLIC PANEL - Abnormal; Notable for the following components:  ? Glucose, Bld 236 (*)   ? BUN 32 (*)   ? Creatinine, Ser 5.25 (*)   ? Calcium 8.4 (*)   ? GFR, Estimated 8 (*)   ? All other components within normal limits  ?CBG MONITORING, ED - Abnormal; Notable for the following components:  ? Glucose-Capillary 182 (*)   ? All other components within normal limits  ?URINE CULTURE  ?AMMONIA  ?ETHANOL  ?LACTIC ACID, PLASMA  ?CBG MONITORING, ED  ?CBG MONITORING, ED  ? ? ?EKG ?EKG Interpretation ? ?Date/Time:  Saturday Dec 18 2021 12:29:37 EDT ?Ventricular Rate:  80 ?PR Interval:  145 ?QRS Duration: 78 ?QT Interval:  392 ?QTC Calculation: 453 ?R Axis:   -35 ?Text Interpretation: Sinus rhythm Left ventricular hypertrophy Anterior Q waves, possibly due to LVH No significant change since last tracing Confirmed by Isla Pence [phone]) on 12/18/2021 4:45:52 PM ? ?Radiology ?CT HEAD WO CONTRAST ? ?Result Date:  12/18/2021 ?CLINICAL DATA:  Altered mental status EXAM: CT HEAD WITHOUT CONTRAST TECHNIQUE: Contiguous axial images were obtained from the base of the skull through the vertex without intravenous contrast. RADIATION DOSE [place]

## 2021-12-18 NOTE — ED Provider Triage Note (Signed)
Emergency Medicine Provider Triage Evaluation Note ? ?[name] [name] , a 86 y.o. female  was evaluated in triage.  Pt complains of AMS.  Presents with her daughter who she has been living with for the past 2 weeks.  According to daughter, patient has been not acting like herself.  She is frequently dozing off and is acting more confused than normal.  Patient does not understand why she is here today.  She thinks that everything is been normal.  Patient is a dialysis patient and most recently had dialysis yesterday.  There were no complications with yesterday's dialysis.  Patient is otherwise had no other symptoms including headache, shortness of breath, chest pain, abdominal pain, nausea, vomiting, or diarrhea.  There have been no fevers or chills.  Patient has been compliant with all medications except for today.  She is on Eliquis.  She reportedly has baseline dementia, however it is worse than normal today. ? ?Review of Systems  ?Positive:  ?Negative:  ? ?Physical Exam  ?BP (!) 157/70 (BP Location: Right Arm) Comment: Simultaneous filing. User may not have seen previous data.  Pulse 86 Comment: Simultaneous filing. User may not have seen previous data.  Temp 98 ?F (36.7 ?C) (Oral)   Resp 18 Comment: Simultaneous filing. User may not have seen previous data.  Ht 5' (1.524 m)   Wt 57 kg   SpO2 95% Comment: Simultaneous filing. User may not have seen previous data.  BMI 24.54 kg/m?  ?Gen:   Awake, no distress   ?Resp:  Normal effort  ?MSK:   Moves extremities without difficulty  ?Other:  Patient is alert and oriented x3.  She does not know why she is here.  She has no focal deficits on exam.  She has normal pupils that are equal round and reactive.  She has normal EOMs.  She moves all extremities spontaneously and has equal strength. ? ?Medical Decision Making  ?Medically screening exam initiated at 12:23 PM.  Appropriate orders placed.  Claribel Sachs Kerth was informed that the remainder of the  evaluation will be completed by another provider, this initial triage assessment does not replace that evaluation, and the importance of remaining in the ED until their evaluation is complete. ? ?Patient does not seem to have a focal deficit.  Doubt acute stroke. She does not seem consistent with sepsis.  Broad altered mental status labs are ordered. ?  ?Adolphus Birchwood, PA-C ?12/18/21 1226 ? ?

## 2021-12-18 NOTE — ED Triage Notes (Signed)
BIB daughter who reports drowsiness, falling asleep after breakfast around 0930, pt alert and oriented. Goes to dialysis MWF, went yesterday. Denies fevers or urinary issues.  ?

## 2021-12-19 ENCOUNTER — Other Ambulatory Visit: Payer: Self-pay | Admitting: Internal Medicine

## 2021-12-20 ENCOUNTER — Telehealth: Payer: Self-pay

## 2021-12-20 DIAGNOSIS — N2581 Secondary hyperparathyroidism of renal origin: Secondary | ICD-10-CM | POA: Diagnosis not present

## 2021-12-20 DIAGNOSIS — Z992 Dependence on renal dialysis: Secondary | ICD-10-CM | POA: Diagnosis not present

## 2021-12-20 DIAGNOSIS — N186 End stage renal disease: Secondary | ICD-10-CM | POA: Diagnosis not present

## 2021-12-20 DIAGNOSIS — R52 Pain, unspecified: Secondary | ICD-10-CM | POA: Diagnosis not present

## 2021-12-20 DIAGNOSIS — D509 Iron deficiency anemia, unspecified: Secondary | ICD-10-CM | POA: Diagnosis not present

## 2021-12-20 LAB — URINE CULTURE: Culture: >=100000 — AB

## 2021-12-20 NOTE — Telephone Encounter (Signed)
Transition Care Management Unsuccessful Follow-up Telephone Call ? ?Date of discharge and from where:  12/18/2021 ? ? ?Attempts:  1st Attempt ? ?Reason for unsuccessful TCM follow-up call:  Left voice message ? ?  ?

## 2021-12-21 ENCOUNTER — Telehealth: Payer: Self-pay | Admitting: Emergency Medicine

## 2021-12-21 NOTE — Telephone Encounter (Signed)
Post ED Visit - Positive Culture Follow-up: Successful Patient Follow-Up ? ?Culture assessed and recommendations reviewed by: ? ?'[]'$  Elenor Quinones, Pharm.D. ?'[]'$  Heide Guile, Pharm.D., BCPS AQ-ID ?'[]'$  Parks Neptune, Pharm.D., BCPS ?'[]'$  Alycia Rossetti, Pharm.D., BCPS ?'[]'$  [place], Pharm.D., BCPS, AAHIVP ?'[]'$  Legrand Como, Pharm.D., BCPS, AAHIVP ?'[]'$  Salome Arnt, PharmD, BCPS ?'[]'$  Johnnette Gourd, PharmD, BCPS ?'[]'$  Hughes Better, PharmD, BCPS ?'[]'$  Leeroy Cha, PharmD ? ?Positive urine culture ? ?'[]'$  Patient discharged without antimicrobial prescription and treatment is now indicated ?'[]'$  Organism is resistant to prescribed ED discharge antimicrobial ?'[]'$  Patient with positive blood cultures ? ?Changes discussed with ED provider: Kathrynn Humble ?New antibiotic prescription stop cephalexin ? ?Attempting to contact emergency contact ? ? ?Hazle Nordmann ?12/21/2021, 8:51 AM ? ?  ?

## 2021-12-21 NOTE — Progress Notes (Signed)
ED Antimicrobial Stewardship Positive Culture Follow Up  ? ?[name] [name] is an 86 y.o. female who presented to [facility] on 12/18/2021 with a chief complaint of  ?Chief Complaint  ?Patient presents with  ? Altered Mental Status  ? ? ?Recent Results (from the past 720 hour(s))  ?Urine Culture     Status: Abnormal  ? Collection Time: 12/18/21  6:56 PM  ? Specimen: Urine, Clean Catch  ?Result Value Ref Range Status  ? Specimen Description   Final  ?  URINE, CLEAN CATCH ?Performed at [facility], [place] [address], [place], [place] [address] ?  ? Special Requests   Final  ?  NONE ?Performed at [facility], [place] [address] [place], [place] [address] ?  ? Culture (A)  Final  ?  >=100,000 COLONIES/mL LACTOBACILLUS SPECIES ?Standardized susceptibility testing for this organism is not available. ?Performed at [facility], [place] [address], [place], [name] [address] ?  ? Report Status 12/20/2021 FINAL  Final  ? ?86 yo F with end stage renal disease (on HD) and dementia presented to ED with altered mental status. Drug screen was positive for THC. Ucx was positive for normal colonizer and the sample had contamination of epithelial cells. ? ?Discharged with 7 day prescription for cephalexin (day 3 is 12/21/21). Recommend calling and telling patient to stop treatment because renal disease could account for growth, symptoms could have been caused by THC, and sample quality was low. ? ? ?ED Provider: Milton Ferguson, MD ? ? ?[name] [name] ?12/21/2021, 8:00 AM ?PharmD Candidate ?  ?

## 2021-12-22 DIAGNOSIS — N186 End stage renal disease: Secondary | ICD-10-CM | POA: Diagnosis not present

## 2021-12-22 DIAGNOSIS — D509 Iron deficiency anemia, unspecified: Secondary | ICD-10-CM | POA: Diagnosis not present

## 2021-12-22 DIAGNOSIS — Z992 Dependence on renal dialysis: Secondary | ICD-10-CM | POA: Diagnosis not present

## 2021-12-22 DIAGNOSIS — R52 Pain, unspecified: Secondary | ICD-10-CM | POA: Diagnosis not present

## 2021-12-22 DIAGNOSIS — N2581 Secondary hyperparathyroidism of renal origin: Secondary | ICD-10-CM | POA: Diagnosis not present

## 2021-12-24 DIAGNOSIS — N2581 Secondary hyperparathyroidism of renal origin: Secondary | ICD-10-CM | POA: Diagnosis not present

## 2021-12-24 DIAGNOSIS — D509 Iron deficiency anemia, unspecified: Secondary | ICD-10-CM | POA: Diagnosis not present

## 2021-12-24 DIAGNOSIS — N186 End stage renal disease: Secondary | ICD-10-CM | POA: Diagnosis not present

## 2021-12-24 DIAGNOSIS — R52 Pain, unspecified: Secondary | ICD-10-CM | POA: Diagnosis not present

## 2021-12-24 DIAGNOSIS — Z992 Dependence on renal dialysis: Secondary | ICD-10-CM | POA: Diagnosis not present

## 2021-12-27 ENCOUNTER — Telehealth: Payer: Self-pay | Admitting: Emergency Medicine

## 2021-12-27 DIAGNOSIS — N2581 Secondary hyperparathyroidism of renal origin: Secondary | ICD-10-CM | POA: Diagnosis not present

## 2021-12-27 DIAGNOSIS — N186 End stage renal disease: Secondary | ICD-10-CM | POA: Diagnosis not present

## 2021-12-27 DIAGNOSIS — R52 Pain, unspecified: Secondary | ICD-10-CM | POA: Diagnosis not present

## 2021-12-27 DIAGNOSIS — D509 Iron deficiency anemia, unspecified: Secondary | ICD-10-CM | POA: Diagnosis not present

## 2021-12-27 DIAGNOSIS — Z992 Dependence on renal dialysis: Secondary | ICD-10-CM | POA: Diagnosis not present

## 2021-12-29 DIAGNOSIS — N2581 Secondary hyperparathyroidism of renal origin: Secondary | ICD-10-CM | POA: Diagnosis not present

## 2021-12-29 DIAGNOSIS — R52 Pain, unspecified: Secondary | ICD-10-CM | POA: Diagnosis not present

## 2021-12-29 DIAGNOSIS — Z992 Dependence on renal dialysis: Secondary | ICD-10-CM | POA: Diagnosis not present

## 2021-12-29 DIAGNOSIS — D509 Iron deficiency anemia, unspecified: Secondary | ICD-10-CM | POA: Diagnosis not present

## 2021-12-29 DIAGNOSIS — N186 End stage renal disease: Secondary | ICD-10-CM | POA: Diagnosis not present

## 2021-12-31 DIAGNOSIS — D509 Iron deficiency anemia, unspecified: Secondary | ICD-10-CM | POA: Diagnosis not present

## 2021-12-31 DIAGNOSIS — Z992 Dependence on renal dialysis: Secondary | ICD-10-CM | POA: Diagnosis not present

## 2021-12-31 DIAGNOSIS — R52 Pain, unspecified: Secondary | ICD-10-CM | POA: Diagnosis not present

## 2021-12-31 DIAGNOSIS — N2581 Secondary hyperparathyroidism of renal origin: Secondary | ICD-10-CM | POA: Diagnosis not present

## 2021-12-31 DIAGNOSIS — N186 End stage renal disease: Secondary | ICD-10-CM | POA: Diagnosis not present

## 2022-01-03 DIAGNOSIS — R52 Pain, unspecified: Secondary | ICD-10-CM | POA: Diagnosis not present

## 2022-01-03 DIAGNOSIS — Z992 Dependence on renal dialysis: Secondary | ICD-10-CM | POA: Diagnosis not present

## 2022-01-03 DIAGNOSIS — N186 End stage renal disease: Secondary | ICD-10-CM | POA: Diagnosis not present

## 2022-01-03 DIAGNOSIS — D509 Iron deficiency anemia, unspecified: Secondary | ICD-10-CM | POA: Diagnosis not present

## 2022-01-03 DIAGNOSIS — N2581 Secondary hyperparathyroidism of renal origin: Secondary | ICD-10-CM | POA: Diagnosis not present

## 2022-01-05 DIAGNOSIS — N2581 Secondary hyperparathyroidism of renal origin: Secondary | ICD-10-CM | POA: Diagnosis not present

## 2022-01-05 DIAGNOSIS — N186 End stage renal disease: Secondary | ICD-10-CM | POA: Diagnosis not present

## 2022-01-05 DIAGNOSIS — R52 Pain, unspecified: Secondary | ICD-10-CM | POA: Diagnosis not present

## 2022-01-05 DIAGNOSIS — E1122 Type 2 diabetes mellitus with diabetic chronic kidney disease: Secondary | ICD-10-CM | POA: Diagnosis not present

## 2022-01-05 DIAGNOSIS — Z992 Dependence on renal dialysis: Secondary | ICD-10-CM | POA: Diagnosis not present

## 2022-01-05 DIAGNOSIS — D509 Iron deficiency anemia, unspecified: Secondary | ICD-10-CM | POA: Diagnosis not present

## 2022-01-07 DIAGNOSIS — N186 End stage renal disease: Secondary | ICD-10-CM | POA: Diagnosis not present

## 2022-01-07 DIAGNOSIS — Z992 Dependence on renal dialysis: Secondary | ICD-10-CM | POA: Diagnosis not present

## 2022-01-07 DIAGNOSIS — D631 Anemia in chronic kidney disease: Secondary | ICD-10-CM | POA: Diagnosis not present

## 2022-01-07 DIAGNOSIS — N2581 Secondary hyperparathyroidism of renal origin: Secondary | ICD-10-CM | POA: Diagnosis not present

## 2022-01-07 DIAGNOSIS — R52 Pain, unspecified: Secondary | ICD-10-CM | POA: Diagnosis not present

## 2022-01-07 DIAGNOSIS — E1110 Type 2 diabetes mellitus with ketoacidosis without coma: Secondary | ICD-10-CM | POA: Diagnosis not present

## 2022-01-10 DIAGNOSIS — E1110 Type 2 diabetes mellitus with ketoacidosis without coma: Secondary | ICD-10-CM | POA: Diagnosis not present

## 2022-01-10 DIAGNOSIS — Z992 Dependence on renal dialysis: Secondary | ICD-10-CM | POA: Diagnosis not present

## 2022-01-10 DIAGNOSIS — D631 Anemia in chronic kidney disease: Secondary | ICD-10-CM | POA: Diagnosis not present

## 2022-01-10 DIAGNOSIS — R52 Pain, unspecified: Secondary | ICD-10-CM | POA: Diagnosis not present

## 2022-01-10 DIAGNOSIS — N2581 Secondary hyperparathyroidism of renal origin: Secondary | ICD-10-CM | POA: Diagnosis not present

## 2022-01-10 DIAGNOSIS — N186 End stage renal disease: Secondary | ICD-10-CM | POA: Diagnosis not present

## 2022-01-12 DIAGNOSIS — N2581 Secondary hyperparathyroidism of renal origin: Secondary | ICD-10-CM | POA: Diagnosis not present

## 2022-01-12 DIAGNOSIS — N186 End stage renal disease: Secondary | ICD-10-CM | POA: Diagnosis not present

## 2022-01-12 DIAGNOSIS — D631 Anemia in chronic kidney disease: Secondary | ICD-10-CM | POA: Diagnosis not present

## 2022-01-12 DIAGNOSIS — Z992 Dependence on renal dialysis: Secondary | ICD-10-CM | POA: Diagnosis not present

## 2022-01-12 DIAGNOSIS — E1110 Type 2 diabetes mellitus with ketoacidosis without coma: Secondary | ICD-10-CM | POA: Diagnosis not present

## 2022-01-12 DIAGNOSIS — R52 Pain, unspecified: Secondary | ICD-10-CM | POA: Diagnosis not present

## 2022-01-13 ENCOUNTER — Other Ambulatory Visit: Payer: Self-pay | Admitting: Internal Medicine

## 2022-01-14 DIAGNOSIS — Z992 Dependence on renal dialysis: Secondary | ICD-10-CM | POA: Diagnosis not present

## 2022-01-14 DIAGNOSIS — N186 End stage renal disease: Secondary | ICD-10-CM | POA: Diagnosis not present

## 2022-01-14 DIAGNOSIS — D631 Anemia in chronic kidney disease: Secondary | ICD-10-CM | POA: Diagnosis not present

## 2022-01-14 DIAGNOSIS — N2581 Secondary hyperparathyroidism of renal origin: Secondary | ICD-10-CM | POA: Diagnosis not present

## 2022-01-14 DIAGNOSIS — E1110 Type 2 diabetes mellitus with ketoacidosis without coma: Secondary | ICD-10-CM | POA: Diagnosis not present

## 2022-01-14 DIAGNOSIS — R52 Pain, unspecified: Secondary | ICD-10-CM | POA: Diagnosis not present

## 2022-01-17 DIAGNOSIS — Z992 Dependence on renal dialysis: Secondary | ICD-10-CM | POA: Diagnosis not present

## 2022-01-17 DIAGNOSIS — N186 End stage renal disease: Secondary | ICD-10-CM | POA: Diagnosis not present

## 2022-01-17 DIAGNOSIS — E1110 Type 2 diabetes mellitus with ketoacidosis without coma: Secondary | ICD-10-CM | POA: Diagnosis not present

## 2022-01-17 DIAGNOSIS — R52 Pain, unspecified: Secondary | ICD-10-CM | POA: Diagnosis not present

## 2022-01-17 DIAGNOSIS — D631 Anemia in chronic kidney disease: Secondary | ICD-10-CM | POA: Diagnosis not present

## 2022-01-17 DIAGNOSIS — N2581 Secondary hyperparathyroidism of renal origin: Secondary | ICD-10-CM | POA: Diagnosis not present

## 2022-01-19 DIAGNOSIS — D631 Anemia in chronic kidney disease: Secondary | ICD-10-CM | POA: Diagnosis not present

## 2022-01-19 DIAGNOSIS — R52 Pain, unspecified: Secondary | ICD-10-CM | POA: Diagnosis not present

## 2022-01-19 DIAGNOSIS — Z992 Dependence on renal dialysis: Secondary | ICD-10-CM | POA: Diagnosis not present

## 2022-01-19 DIAGNOSIS — N186 End stage renal disease: Secondary | ICD-10-CM | POA: Diagnosis not present

## 2022-01-19 DIAGNOSIS — E1110 Type 2 diabetes mellitus with ketoacidosis without coma: Secondary | ICD-10-CM | POA: Diagnosis not present

## 2022-01-19 DIAGNOSIS — N2581 Secondary hyperparathyroidism of renal origin: Secondary | ICD-10-CM | POA: Diagnosis not present

## 2022-01-20 ENCOUNTER — Other Ambulatory Visit: Payer: Self-pay

## 2022-01-20 NOTE — Telephone Encounter (Signed)
Transition Care Management Follow-up Telephone Call Date of discharge and from where: [facility] emergency dept 12/18/2021  How have you been since you were released from the hospital? She is doing okay now, currently lives with daughter.  Any questions or concerns? No  Items Reviewed: Did the pt receive and understand the discharge instructions provided? Yes  Medications obtained and verified? Yes  Other? Yes  Any new allergies since your discharge? No  Dietary orders reviewed? Yes Do you have support at home? Yes   Home Care and Equipment/Supplies: Were home health services ordered? no If so, what is the name of the agency? N/a  Has the agency set up a time to come to the patient's home? no Were any new equipment or medical supplies ordered?  No What is the name of the medical supply agency? N/a Were you able to get the supplies/equipment? no Do you have any questions related to the use of the equipment or supplies? No  Functional Questionnaire: (I = Independent and D = Dependent) ADLs: d  Bathing/Dressing- d  Meal Prep- d  Eating- d  Maintaining continence- d  Transferring/Ambulation- d  Managing Meds- d  Follow up appointments reviewed:  PCP Hospital f/u appt confirmed? Yes  Scheduled to see robyn sanders on [phone] @ 83pm. [facility] f/u appt confirmed? No  Scheduled to see n/a on n/a @ n/a. Are transportation arrangements needed? No  If their condition worsens, is the pt aware to call PCP or go to the Emergency Dept.? Yes Was the patient provided with contact information for the PCP's office or ED? Yes Was to pt encouraged to call back with questions or concerns? Yes

## 2022-01-21 DIAGNOSIS — N2581 Secondary hyperparathyroidism of renal origin: Secondary | ICD-10-CM | POA: Diagnosis not present

## 2022-01-21 DIAGNOSIS — R52 Pain, unspecified: Secondary | ICD-10-CM | POA: Diagnosis not present

## 2022-01-21 DIAGNOSIS — E1110 Type 2 diabetes mellitus with ketoacidosis without coma: Secondary | ICD-10-CM | POA: Diagnosis not present

## 2022-01-21 DIAGNOSIS — N186 End stage renal disease: Secondary | ICD-10-CM | POA: Diagnosis not present

## 2022-01-21 DIAGNOSIS — Z992 Dependence on renal dialysis: Secondary | ICD-10-CM | POA: Diagnosis not present

## 2022-01-21 DIAGNOSIS — D631 Anemia in chronic kidney disease: Secondary | ICD-10-CM | POA: Diagnosis not present

## 2022-01-24 DIAGNOSIS — R52 Pain, unspecified: Secondary | ICD-10-CM | POA: Diagnosis not present

## 2022-01-24 DIAGNOSIS — N186 End stage renal disease: Secondary | ICD-10-CM | POA: Diagnosis not present

## 2022-01-24 DIAGNOSIS — Z992 Dependence on renal dialysis: Secondary | ICD-10-CM | POA: Diagnosis not present

## 2022-01-24 DIAGNOSIS — D631 Anemia in chronic kidney disease: Secondary | ICD-10-CM | POA: Diagnosis not present

## 2022-01-24 DIAGNOSIS — E1110 Type 2 diabetes mellitus with ketoacidosis without coma: Secondary | ICD-10-CM | POA: Diagnosis not present

## 2022-01-24 DIAGNOSIS — N2581 Secondary hyperparathyroidism of renal origin: Secondary | ICD-10-CM | POA: Diagnosis not present

## 2022-01-26 DIAGNOSIS — D631 Anemia in chronic kidney disease: Secondary | ICD-10-CM | POA: Diagnosis not present

## 2022-01-26 DIAGNOSIS — Z992 Dependence on renal dialysis: Secondary | ICD-10-CM | POA: Diagnosis not present

## 2022-01-26 DIAGNOSIS — N2581 Secondary hyperparathyroidism of renal origin: Secondary | ICD-10-CM | POA: Diagnosis not present

## 2022-01-26 DIAGNOSIS — N186 End stage renal disease: Secondary | ICD-10-CM | POA: Diagnosis not present

## 2022-01-26 DIAGNOSIS — E1110 Type 2 diabetes mellitus with ketoacidosis without coma: Secondary | ICD-10-CM | POA: Diagnosis not present

## 2022-01-26 DIAGNOSIS — R52 Pain, unspecified: Secondary | ICD-10-CM | POA: Diagnosis not present

## 2022-01-28 DIAGNOSIS — E1110 Type 2 diabetes mellitus with ketoacidosis without coma: Secondary | ICD-10-CM | POA: Diagnosis not present

## 2022-01-28 DIAGNOSIS — N2581 Secondary hyperparathyroidism of renal origin: Secondary | ICD-10-CM | POA: Diagnosis not present

## 2022-01-28 DIAGNOSIS — R52 Pain, unspecified: Secondary | ICD-10-CM | POA: Diagnosis not present

## 2022-01-28 DIAGNOSIS — N186 End stage renal disease: Secondary | ICD-10-CM | POA: Diagnosis not present

## 2022-01-28 DIAGNOSIS — D631 Anemia in chronic kidney disease: Secondary | ICD-10-CM | POA: Diagnosis not present

## 2022-01-28 DIAGNOSIS — Z992 Dependence on renal dialysis: Secondary | ICD-10-CM | POA: Diagnosis not present

## 2022-01-29 ENCOUNTER — Other Ambulatory Visit: Payer: Self-pay | Admitting: Internal Medicine

## 2022-01-31 DIAGNOSIS — N186 End stage renal disease: Secondary | ICD-10-CM | POA: Diagnosis not present

## 2022-01-31 DIAGNOSIS — D631 Anemia in chronic kidney disease: Secondary | ICD-10-CM | POA: Diagnosis not present

## 2022-01-31 DIAGNOSIS — Z992 Dependence on renal dialysis: Secondary | ICD-10-CM | POA: Diagnosis not present

## 2022-01-31 DIAGNOSIS — R52 Pain, unspecified: Secondary | ICD-10-CM | POA: Diagnosis not present

## 2022-01-31 DIAGNOSIS — N2581 Secondary hyperparathyroidism of renal origin: Secondary | ICD-10-CM | POA: Diagnosis not present

## 2022-01-31 DIAGNOSIS — E1110 Type 2 diabetes mellitus with ketoacidosis without coma: Secondary | ICD-10-CM | POA: Diagnosis not present

## 2022-02-02 DIAGNOSIS — N2581 Secondary hyperparathyroidism of renal origin: Secondary | ICD-10-CM | POA: Diagnosis not present

## 2022-02-02 DIAGNOSIS — E1110 Type 2 diabetes mellitus with ketoacidosis without coma: Secondary | ICD-10-CM | POA: Diagnosis not present

## 2022-02-02 DIAGNOSIS — Z992 Dependence on renal dialysis: Secondary | ICD-10-CM | POA: Diagnosis not present

## 2022-02-02 DIAGNOSIS — D631 Anemia in chronic kidney disease: Secondary | ICD-10-CM | POA: Diagnosis not present

## 2022-02-02 DIAGNOSIS — N186 End stage renal disease: Secondary | ICD-10-CM | POA: Diagnosis not present

## 2022-02-02 DIAGNOSIS — R52 Pain, unspecified: Secondary | ICD-10-CM | POA: Diagnosis not present

## 2022-02-04 DIAGNOSIS — E1122 Type 2 diabetes mellitus with diabetic chronic kidney disease: Secondary | ICD-10-CM | POA: Diagnosis not present

## 2022-02-04 DIAGNOSIS — E1110 Type 2 diabetes mellitus with ketoacidosis without coma: Secondary | ICD-10-CM | POA: Diagnosis not present

## 2022-02-04 DIAGNOSIS — D631 Anemia in chronic kidney disease: Secondary | ICD-10-CM | POA: Diagnosis not present

## 2022-02-04 DIAGNOSIS — R52 Pain, unspecified: Secondary | ICD-10-CM | POA: Diagnosis not present

## 2022-02-04 DIAGNOSIS — N186 End stage renal disease: Secondary | ICD-10-CM | POA: Diagnosis not present

## 2022-02-04 DIAGNOSIS — N2581 Secondary hyperparathyroidism of renal origin: Secondary | ICD-10-CM | POA: Diagnosis not present

## 2022-02-04 DIAGNOSIS — Z992 Dependence on renal dialysis: Secondary | ICD-10-CM | POA: Diagnosis not present

## 2022-02-07 DIAGNOSIS — E1110 Type 2 diabetes mellitus with ketoacidosis without coma: Secondary | ICD-10-CM | POA: Diagnosis not present

## 2022-02-07 DIAGNOSIS — N2581 Secondary hyperparathyroidism of renal origin: Secondary | ICD-10-CM | POA: Diagnosis not present

## 2022-02-07 DIAGNOSIS — D631 Anemia in chronic kidney disease: Secondary | ICD-10-CM | POA: Diagnosis not present

## 2022-02-07 DIAGNOSIS — Z992 Dependence on renal dialysis: Secondary | ICD-10-CM | POA: Diagnosis not present

## 2022-02-07 DIAGNOSIS — N186 End stage renal disease: Secondary | ICD-10-CM | POA: Diagnosis not present

## 2022-02-07 DIAGNOSIS — R52 Pain, unspecified: Secondary | ICD-10-CM | POA: Diagnosis not present

## 2022-02-09 DIAGNOSIS — N186 End stage renal disease: Secondary | ICD-10-CM | POA: Diagnosis not present

## 2022-02-09 DIAGNOSIS — Z992 Dependence on renal dialysis: Secondary | ICD-10-CM | POA: Diagnosis not present

## 2022-02-09 DIAGNOSIS — R52 Pain, unspecified: Secondary | ICD-10-CM | POA: Diagnosis not present

## 2022-02-09 DIAGNOSIS — E1110 Type 2 diabetes mellitus with ketoacidosis without coma: Secondary | ICD-10-CM | POA: Diagnosis not present

## 2022-02-09 DIAGNOSIS — D631 Anemia in chronic kidney disease: Secondary | ICD-10-CM | POA: Diagnosis not present

## 2022-02-09 DIAGNOSIS — N2581 Secondary hyperparathyroidism of renal origin: Secondary | ICD-10-CM | POA: Diagnosis not present

## 2022-02-11 DIAGNOSIS — N2581 Secondary hyperparathyroidism of renal origin: Secondary | ICD-10-CM | POA: Diagnosis not present

## 2022-02-11 DIAGNOSIS — N186 End stage renal disease: Secondary | ICD-10-CM | POA: Diagnosis not present

## 2022-02-11 DIAGNOSIS — R52 Pain, unspecified: Secondary | ICD-10-CM | POA: Diagnosis not present

## 2022-02-11 DIAGNOSIS — Z992 Dependence on renal dialysis: Secondary | ICD-10-CM | POA: Diagnosis not present

## 2022-02-11 DIAGNOSIS — E1110 Type 2 diabetes mellitus with ketoacidosis without coma: Secondary | ICD-10-CM | POA: Diagnosis not present

## 2022-02-11 DIAGNOSIS — D631 Anemia in chronic kidney disease: Secondary | ICD-10-CM | POA: Diagnosis not present

## 2022-02-14 DIAGNOSIS — N2581 Secondary hyperparathyroidism of renal origin: Secondary | ICD-10-CM | POA: Diagnosis not present

## 2022-02-14 DIAGNOSIS — N186 End stage renal disease: Secondary | ICD-10-CM | POA: Diagnosis not present

## 2022-02-14 DIAGNOSIS — Z992 Dependence on renal dialysis: Secondary | ICD-10-CM | POA: Diagnosis not present

## 2022-02-14 DIAGNOSIS — R52 Pain, unspecified: Secondary | ICD-10-CM | POA: Diagnosis not present

## 2022-02-14 DIAGNOSIS — E1110 Type 2 diabetes mellitus with ketoacidosis without coma: Secondary | ICD-10-CM | POA: Diagnosis not present

## 2022-02-14 DIAGNOSIS — D631 Anemia in chronic kidney disease: Secondary | ICD-10-CM | POA: Diagnosis not present

## 2022-02-16 DIAGNOSIS — Z992 Dependence on renal dialysis: Secondary | ICD-10-CM | POA: Diagnosis not present

## 2022-02-16 DIAGNOSIS — R52 Pain, unspecified: Secondary | ICD-10-CM | POA: Diagnosis not present

## 2022-02-16 DIAGNOSIS — D631 Anemia in chronic kidney disease: Secondary | ICD-10-CM | POA: Diagnosis not present

## 2022-02-16 DIAGNOSIS — E1110 Type 2 diabetes mellitus with ketoacidosis without coma: Secondary | ICD-10-CM | POA: Diagnosis not present

## 2022-02-16 DIAGNOSIS — N2581 Secondary hyperparathyroidism of renal origin: Secondary | ICD-10-CM | POA: Diagnosis not present

## 2022-02-16 DIAGNOSIS — N186 End stage renal disease: Secondary | ICD-10-CM | POA: Diagnosis not present

## 2022-02-18 DIAGNOSIS — N2581 Secondary hyperparathyroidism of renal origin: Secondary | ICD-10-CM | POA: Diagnosis not present

## 2022-02-18 DIAGNOSIS — D631 Anemia in chronic kidney disease: Secondary | ICD-10-CM | POA: Diagnosis not present

## 2022-02-18 DIAGNOSIS — R52 Pain, unspecified: Secondary | ICD-10-CM | POA: Diagnosis not present

## 2022-02-18 DIAGNOSIS — Z992 Dependence on renal dialysis: Secondary | ICD-10-CM | POA: Diagnosis not present

## 2022-02-18 DIAGNOSIS — E1110 Type 2 diabetes mellitus with ketoacidosis without coma: Secondary | ICD-10-CM | POA: Diagnosis not present

## 2022-02-18 DIAGNOSIS — N186 End stage renal disease: Secondary | ICD-10-CM | POA: Diagnosis not present

## 2022-02-21 DIAGNOSIS — D631 Anemia in chronic kidney disease: Secondary | ICD-10-CM | POA: Diagnosis not present

## 2022-02-21 DIAGNOSIS — N2581 Secondary hyperparathyroidism of renal origin: Secondary | ICD-10-CM | POA: Diagnosis not present

## 2022-02-21 DIAGNOSIS — Z992 Dependence on renal dialysis: Secondary | ICD-10-CM | POA: Diagnosis not present

## 2022-02-21 DIAGNOSIS — N186 End stage renal disease: Secondary | ICD-10-CM | POA: Diagnosis not present

## 2022-02-21 DIAGNOSIS — R52 Pain, unspecified: Secondary | ICD-10-CM | POA: Diagnosis not present

## 2022-02-21 DIAGNOSIS — E1110 Type 2 diabetes mellitus with ketoacidosis without coma: Secondary | ICD-10-CM | POA: Diagnosis not present

## 2022-02-23 DIAGNOSIS — N186 End stage renal disease: Secondary | ICD-10-CM | POA: Diagnosis not present

## 2022-02-23 DIAGNOSIS — Z992 Dependence on renal dialysis: Secondary | ICD-10-CM | POA: Diagnosis not present

## 2022-02-23 DIAGNOSIS — N2581 Secondary hyperparathyroidism of renal origin: Secondary | ICD-10-CM | POA: Diagnosis not present

## 2022-02-23 DIAGNOSIS — R52 Pain, unspecified: Secondary | ICD-10-CM | POA: Diagnosis not present

## 2022-02-23 DIAGNOSIS — E1110 Type 2 diabetes mellitus with ketoacidosis without coma: Secondary | ICD-10-CM | POA: Diagnosis not present

## 2022-02-23 DIAGNOSIS — D631 Anemia in chronic kidney disease: Secondary | ICD-10-CM | POA: Diagnosis not present

## 2022-02-25 DIAGNOSIS — E1110 Type 2 diabetes mellitus with ketoacidosis without coma: Secondary | ICD-10-CM | POA: Diagnosis not present

## 2022-02-25 DIAGNOSIS — N2581 Secondary hyperparathyroidism of renal origin: Secondary | ICD-10-CM | POA: Diagnosis not present

## 2022-02-25 DIAGNOSIS — N186 End stage renal disease: Secondary | ICD-10-CM | POA: Diagnosis not present

## 2022-02-25 DIAGNOSIS — R52 Pain, unspecified: Secondary | ICD-10-CM | POA: Diagnosis not present

## 2022-02-25 DIAGNOSIS — D631 Anemia in chronic kidney disease: Secondary | ICD-10-CM | POA: Diagnosis not present

## 2022-02-25 DIAGNOSIS — Z992 Dependence on renal dialysis: Secondary | ICD-10-CM | POA: Diagnosis not present

## 2022-02-28 DIAGNOSIS — E1110 Type 2 diabetes mellitus with ketoacidosis without coma: Secondary | ICD-10-CM | POA: Diagnosis not present

## 2022-02-28 DIAGNOSIS — R52 Pain, unspecified: Secondary | ICD-10-CM | POA: Diagnosis not present

## 2022-02-28 DIAGNOSIS — D631 Anemia in chronic kidney disease: Secondary | ICD-10-CM | POA: Diagnosis not present

## 2022-02-28 DIAGNOSIS — N186 End stage renal disease: Secondary | ICD-10-CM | POA: Diagnosis not present

## 2022-02-28 DIAGNOSIS — Z992 Dependence on renal dialysis: Secondary | ICD-10-CM | POA: Diagnosis not present

## 2022-02-28 DIAGNOSIS — N2581 Secondary hyperparathyroidism of renal origin: Secondary | ICD-10-CM | POA: Diagnosis not present

## 2022-03-01 ENCOUNTER — Encounter: Payer: Self-pay | Admitting: Internal Medicine

## 2022-03-01 ENCOUNTER — Ambulatory Visit (INDEPENDENT_AMBULATORY_CARE_PROVIDER_SITE_OTHER): Payer: Medicare Other | Admitting: Internal Medicine

## 2022-03-01 VITALS — BP 120/60 | HR 78 | Temp 98.1°F | Ht 60.0 in | Wt 131.0 lb

## 2022-03-01 DIAGNOSIS — D6869 Other thrombophilia: Secondary | ICD-10-CM | POA: Diagnosis not present

## 2022-03-01 DIAGNOSIS — Z23 Encounter for immunization: Secondary | ICD-10-CM | POA: Diagnosis not present

## 2022-03-01 DIAGNOSIS — I480 Paroxysmal atrial fibrillation: Secondary | ICD-10-CM

## 2022-03-01 DIAGNOSIS — E1122 Type 2 diabetes mellitus with diabetic chronic kidney disease: Secondary | ICD-10-CM

## 2022-03-01 DIAGNOSIS — N186 End stage renal disease: Secondary | ICD-10-CM

## 2022-03-01 DIAGNOSIS — Z6825 Body mass index (BMI) 25.0-25.9, adult: Secondary | ICD-10-CM

## 2022-03-01 DIAGNOSIS — N2581 Secondary hyperparathyroidism of renal origin: Secondary | ICD-10-CM | POA: Diagnosis not present

## 2022-03-01 DIAGNOSIS — I132 Hypertensive heart and chronic kidney disease with heart failure and with stage 5 chronic kidney disease, or end stage renal disease: Secondary | ICD-10-CM

## 2022-03-01 NOTE — Progress Notes (Signed)
Barnet [name],acting as a Education administrator for Maximino Greenland, MD.,have documented all relevant documentation on the behalf of Maximino Greenland, MD,as directed by  Maximino Greenland, MD while in the presence of Maximino Greenland, MD.    Subjective:     Patient ID: [name] [name] , female    DOB: [birth year] , 86 y.o.   MRN: [id number]   Chief Complaint  Patient presents with   Follow-up   Diabetes   Hypertension    HPI  Patient went to the hospital May 13th and was discharged the same day. Patient's daughter states she had an UTI and they gave her medication but called and told her to stop medication after 5 days. Patient also has medication dialysis asked her to put her on.   Patient is with [name] - Daughter BP Readings from Last 3 Encounters: 03/01/22 : 120/60 12/18/21 : 137/61 08/21/21 : 127/62       Past Medical History:  Diagnosis Date   Arthritis    RA   CHF (congestive heart failure) (HCC)    CKD (chronic kidney disease) stage 3, GFR 30-59 ml/min (HCC)    M-W-F dialysis   Dementia ([place])    Diabetes mellitus without complication (HCC)    insulin dependent   Hyperlipidemia    Hypertension    Seasonal allergies    Shortness of breath dyspnea    occasional with exertion - no oxygen   Sleep apnea    does not use cpap   TIA (transient ischemic attack) 2015     Family History  Problem Relation Age of Onset   Diabetes type II Mother    Hypertension Mother    Diabetes type II Father    Hypertension Other    Hyperlipidemia Other    Stroke Other      Current Outpatient Medications:    amLODipine (NORVASC) 10 MG tablet, TAKE 1 TABLET(10 MG) BY MOUTH DAILY, Disp: 90 tablet, Rfl: 1   ascorbic acid (VITAMIN C) 500 MG tablet, Take 1 tablet (500 mg total) by mouth daily., Disp: , Rfl:    atorvastatin (LIPITOR) 20 MG tablet, TAKE 1 TABLET(20 MG) BY MOUTH DAILY AT 6 PM FOR CHOLESTEROL (Patient taking differently: Take 20 mg by mouth daily.), Disp: 90 tablet, Rfl: 1    blood glucose meter kit and supplies KIT, Dispense based on patient and insurance preference. Use up to four times daily as directed. (FOR ICD-9 250.00, 250.01)., Disp: 1 each, Rfl: 0   Blood Pressure Monitor KIT, Use as directed to check blood pressure as needed, Disp: 1 kit, Rfl: 1   cetirizine (ZYRTEC) 10 MG tablet, Take 10 mg by mouth daily., Disp: , Rfl:    cloNIDine (CATAPRES) 0.1 MG tablet, Take 0.1 mg by mouth daily., Disp: , Rfl:    donepezil (ARICEPT) 5 MG tablet, TAKE 1 TABLET(5 MG) BY MOUTH AT BEDTIME, Disp: 30 tablet, Rfl: 2   ELIQUIS 2.5 MG TABS tablet, TAKE 1 TABLET(2.5 MG) BY MOUTH TWICE DAILY, Disp: 60 tablet, Rfl: 2   glucose blood test strip, Use as directed to check blood sugars 3 times per day dx: e11.65 (Patient taking differently: 1 each by Other route See admin instructions. Use as directed to check blood sugars 3 times per day dx: e11.65), Disp: 100 each, Rfl: 12   insulin detemir (LEVEMIR FLEXTOUCH) 100 UNIT/ML FlexPen, Inject 8 Units into the skin at bedtime. Please follow-up with your PCP as an outpatient to check if you continue to  need the insulin.  Do not take insulin when your sugar is low.  Monitor your sugars at home (Patient taking differently: Inject 5 Units into the skin at bedtime. Please follow-up with your PCP as an outpatient to check if you continue to need the insulin.  Do not take insulin when your sugar is low.  Monitor your sugars at home), Disp: , Rfl:    lanthanum (FOSRENOL) 1000 MG chewable tablet, Chew 1,000 mg by mouth 3 (three) times daily with meals., Disp: , Rfl:    lidocaine (LMX) 4 % cream, SMARTSIG:Sparingly Topical As Directed, Disp: , Rfl:    loratadine (CLARITIN) 10 MG tablet, Take 10 mg by mouth daily., Disp: , Rfl:    metoprolol tartrate (LOPRESSOR) 25 MG tablet, TAKE 1/2 TABLET(12.5 MG) BY MOUTH TWICE DAILY, Disp: 90 tablet, Rfl: 1   polyethylene glycol (MIRALAX) 17 g packet, Take 17 g by mouth daily as needed., Disp: 14 each, Rfl: 0   zinc  sulfate 220 (50 Zn) MG capsule, Take 1 capsule (220 mg total) by mouth daily., Disp: , Rfl:    cephALEXin (KEFLEX) 500 MG capsule, Take 1 capsule (500 mg total) by mouth 2 (two) times daily. Take after dialysis on dialysis days (Patient not taking: Reported on 03/01/2022), Disp: 14 capsule, Rfl: 0   Allergies  Allergen Reactions   Penicillins Swelling and Rash     Review of Systems  Constitutional: Negative.   HENT: Negative.    Eyes: Negative.   Respiratory: Negative.    Cardiovascular: Negative.   Gastrointestinal: Negative.   Musculoskeletal: Negative.   Skin: Negative.   Psychiatric/Behavioral: Negative.       Today's Vitals   03/01/22 1612  BP: 120/60  Pulse: 78  Temp: 98.1 F (36.7 C)  TempSrc: Oral  Weight: 131 lb (59.4 kg)  Height: 5' (1.524 m)  PainSc: 0-No pain   Body mass index is 25.58 kg/m.  Wt Readings from Last 3 Encounters:  03/01/22 131 lb (59.4 kg)  12/18/21 125 lb 10.6 oz (57 kg)  08/20/21 125 lb 7.1 oz (56.9 kg)     Objective:  Physical Exam Vitals and nursing note reviewed.  Constitutional:      Appearance: Normal appearance.  HENT:     Head: Normocephalic and atraumatic.  Eyes:     Extraocular Movements: Extraocular movements intact.  Cardiovascular:     Rate and Rhythm: Normal rate and regular rhythm.     Heart sounds: Normal heart sounds.  Pulmonary:     Effort: Pulmonary effort is normal.     Breath sounds: Normal breath sounds.  Abdominal:     Hernia: No hernia is present.  Musculoskeletal:     Cervical back: Normal range of motion.  Skin:    General: Skin is warm.  Neurological:     General: No focal deficit present.     Mental Status: She is alert.  Psychiatric:        Mood and Affect: Mood normal.        Behavior: Behavior normal.         Assessment And Plan:     1. Diabetes mellitus with end-stage renal disease ([place]) Comments: Chronic, I will check labs as below. Importance of dietary compliance was d/w patient. She  agrees to referral for diabetic eye exam.  - Hemoglobin A1c - Microalbumin / Creatinine Urine Ratio - CMP14+EGFR - Protein electrophoresis, serum - Lipid panel - CBC no Diff - Ambulatory referral to Ophthalmology  2. 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 She is encouraged to avoid adding salt to her foods.  - CMP14+EGFR - Lipid panel  3. Paroxysmal atrial fibrillation (HCC) Comments: Chronic, she is properly anticoagulated and rate controlled.  - CBC no Diff  4. Hyperparathyroidism, secondary renal ([place]) Comments: Chronic, on HD MWF.   5. Acquired thrombophilia ([place]) Comments: She is on Eliquis due to PAF.   6. Body mass index (BMI) of 25.0-25.9 in adult Comments: She is encouraged to perform chair exercises while watching TV.   7. Immunization due - Pneumococcal polysaccharide vaccine 23-valent greater than or equal to 2yo subcutaneous/IM   Patient was given opportunity to ask questions. Patient verbalized understanding of the plan and was able to repeat key elements of the plan. All questions were answered to their satisfaction.   I, Maximino Greenland, MD, have reviewed all documentation for this visit. The documentation on 03/01/22 for the exam, diagnosis, procedures, and orders are all accurate and complete.   IF YOU HAVE BEEN REFERRED TO A SPECIALIST, IT MAY TAKE 1-2 WEEKS TO SCHEDULE/PROCESS THE REFERRAL. IF YOU HAVE NOT HEARD FROM US/SPECIALIST IN TWO WEEKS, PLEASE GIVE [place] A CALL AT [phone] X [phone].   THE PATIENT IS ENCOURAGED TO PRACTICE SOCIAL DISTANCING DUE TO THE COVID-19 PANDEMIC.

## 2022-03-01 NOTE — Patient Instructions (Signed)
Urinary Tract Infection, Adult A urinary tract infection (UTI) is an infection of any part of the urinary tract. The urinary tract includes: The kidneys. The ureters. The bladder. The urethra. These organs make, store, and get rid of pee (urine) in the body. What are the causes? This infection is caused by germs (bacteria) in your genital area. These germs grow and cause swelling (inflammation) of your urinary tract. What increases the risk? The following factors may make you more likely to develop this condition: Using a small, thin tube (catheter) to drain pee. Not being able to control when you pee or poop (incontinence). Being female. If you are female, these things can increase the risk: Using these methods to prevent pregnancy: A medicine that kills sperm (spermicide). A device that blocks sperm (diaphragm). Having low levels of a female hormone (estrogen). Being pregnant. You are more likely to develop this condition if: You have genes that add to your risk. You are sexually active. You take antibiotic medicines. You have trouble peeing because of: A prostate that is bigger than normal, if you are female. A blockage in the part of your body that drains pee from the bladder. A kidney stone. A nerve condition that affects your bladder. Not getting enough to drink. Not peeing often enough. You have other conditions, such as: Diabetes. A weak disease-fighting system (immune system). Sickle cell disease. Gout. Injury of the spine. What are the signs or symptoms? Symptoms of this condition include: Needing to pee right away. Peeing small amounts often. Pain or burning when peeing. Blood in the pee. Pee that smells bad or not like normal. Trouble peeing. Pee that is cloudy. Fluid coming from the vagina, if you are female. Pain in the belly or lower back. Other symptoms include: Vomiting. Not feeling hungry. Feeling mixed up (confused). This may be the first symptom in  older adults. Being tired and grouchy (irritable). A fever. Watery poop (diarrhea). How is this treated? Taking antibiotic medicine. Taking other medicines. Drinking enough water. In some cases, you may need to see a specialist. Follow these instructions at home:  Medicines Take over-the-counter and prescription medicines only as told by your doctor. If you were prescribed an antibiotic medicine, take it as told by your doctor. Do not stop taking it even if you start to feel better. General instructions Make sure you: Pee until your bladder is empty. Do not hold pee for a [place] time. Empty your bladder after sex. Wipe from front to back after peeing or pooping if you are a female. Use each tissue one time when you wipe. Drink enough fluid to keep your pee pale yellow. Keep all follow-up visits. Contact a doctor if: You do not get better after 1-2 days. Your symptoms go away and then come back. Get help right away if: You have very bad back pain. You have very bad pain in your lower belly. You have a fever. You have chills. You feeling like you will vomit or you vomit. Summary A urinary tract infection (UTI) is an infection of any part of the urinary tract. This condition is caused by germs in your genital area. There are many risk factors for a UTI. Treatment includes antibiotic medicines. Drink enough fluid to keep your pee pale yellow. This information is not intended to replace advice given to you by your health care provider. Make sure you discuss any questions you have with your health care provider. Document Revised: 03/06/2020 Document Reviewed: 03/06/2020 Elsevier Patient Education    2023 [employer]  

## 2022-03-04 DIAGNOSIS — N186 End stage renal disease: Secondary | ICD-10-CM | POA: Diagnosis not present

## 2022-03-04 DIAGNOSIS — N2581 Secondary hyperparathyroidism of renal origin: Secondary | ICD-10-CM | POA: Diagnosis not present

## 2022-03-04 DIAGNOSIS — E1110 Type 2 diabetes mellitus with ketoacidosis without coma: Secondary | ICD-10-CM | POA: Diagnosis not present

## 2022-03-04 DIAGNOSIS — D631 Anemia in chronic kidney disease: Secondary | ICD-10-CM | POA: Diagnosis not present

## 2022-03-04 DIAGNOSIS — R52 Pain, unspecified: Secondary | ICD-10-CM | POA: Diagnosis not present

## 2022-03-04 DIAGNOSIS — Z992 Dependence on renal dialysis: Secondary | ICD-10-CM | POA: Diagnosis not present

## 2022-03-04 LAB — CBC
Hematocrit: 36 % (ref 34.0–46.6)
Hemoglobin: 11.5 g/dL (ref 11.1–15.9)
MCH: 25.5 pg — ABNORMAL LOW (ref 26.6–33.0)
MCHC: 31.9 g/dL (ref 31.5–35.7)
MCV: 80 fL (ref 79–97)
NRBC: 1 % — ABNORMAL HIGH (ref 0–0)
Platelets: 174 10*3/uL (ref 150–450)
RBC: 4.51 x10E6/uL (ref 3.77–5.28)
RDW: 22.4 % — ABNORMAL HIGH (ref 11.7–15.4)
WBC: 4 10*3/uL (ref 3.4–10.8)

## 2022-03-04 LAB — PROTEIN ELECTROPHORESIS, SERUM
A/G Ratio: 1.2 (ref 0.7–1.7)
Albumin ELP: 3.6 g/dL (ref 2.9–4.4)
Alpha 1: 0.2 g/dL (ref 0.0–0.4)
Alpha 2: 0.5 g/dL (ref 0.4–1.0)
Beta: 0.7 g/dL (ref 0.7–1.3)
Gamma Globulin: 1.8 g/dL (ref 0.4–1.8)
Globulin, Total: 3.1 g/dL (ref 2.2–3.9)
M-Spike, %: 1.6 g/dL — ABNORMAL HIGH

## 2022-03-04 LAB — CMP14+EGFR
ALT: 9 IU/L (ref 0–32)
AST: 14 IU/L (ref 0–40)
Albumin/Globulin Ratio: 1.5 (ref 1.2–2.2)
Albumin: 4 g/dL (ref 3.7–4.7)
Alkaline Phosphatase: 70 IU/L (ref 44–121)
BUN/Creatinine Ratio: 4 — ABNORMAL LOW (ref 12–28)
BUN: 22 mg/dL (ref 8–27)
Bilirubin Total: 0.4 mg/dL (ref 0.0–1.2)
CO2: 26 mmol/L (ref 20–29)
Calcium: 9.3 mg/dL (ref 8.7–10.3)
Chloride: 95 mmol/L — ABNORMAL LOW (ref 96–106)
Creatinine, Ser: 5.33 mg/dL — ABNORMAL HIGH (ref 0.57–1.00)
Globulin, Total: 2.7 g/dL (ref 1.5–4.5)
Glucose: 176 mg/dL — ABNORMAL HIGH (ref 70–99)
Potassium: 3.8 mmol/L (ref 3.5–5.2)
Sodium: 137 mmol/L (ref 134–144)
Total Protein: 6.7 g/dL (ref 6.0–8.5)
eGFR: 7 mL/min/{1.73_m2} — ABNORMAL LOW (ref >59)

## 2022-03-04 LAB — LIPID PANEL
Chol/HDL Ratio: 2.5 ratio (ref 0.0–4.4)
Cholesterol, Total: 170 mg/dL (ref 100–199)
HDL: 68 mg/dL (ref >39)
LDL Chol Calc (NIH): 84 mg/dL (ref 0–99)
Triglycerides: 100 mg/dL (ref 0–149)
VLDL Cholesterol Cal: 18 mg/dL (ref 5–40)

## 2022-03-04 LAB — MICROALBUMIN / CREATININE URINE RATIO
Creatinine, Urine: 132.4 mg/dL
Microalb/Creat Ratio: 104 mg/g creat — ABNORMAL HIGH (ref 0–29)
Microalbumin, Urine: 137.6 ug/mL

## 2022-03-04 LAB — HEMOGLOBIN A1C
Est. average glucose Bld gHb Est-mCnc: 114 mg/dL
Hgb A1c MFr Bld: 5.6 % (ref 4.8–5.6)

## 2022-03-06 ENCOUNTER — Other Ambulatory Visit: Payer: Self-pay | Admitting: Internal Medicine

## 2022-03-06 DIAGNOSIS — R778 Other specified abnormalities of plasma proteins: Secondary | ICD-10-CM

## 2022-03-07 DIAGNOSIS — E1110 Type 2 diabetes mellitus with ketoacidosis without coma: Secondary | ICD-10-CM | POA: Diagnosis not present

## 2022-03-07 DIAGNOSIS — N2581 Secondary hyperparathyroidism of renal origin: Secondary | ICD-10-CM | POA: Diagnosis not present

## 2022-03-07 DIAGNOSIS — Z992 Dependence on renal dialysis: Secondary | ICD-10-CM | POA: Diagnosis not present

## 2022-03-07 DIAGNOSIS — R52 Pain, unspecified: Secondary | ICD-10-CM | POA: Diagnosis not present

## 2022-03-07 DIAGNOSIS — D631 Anemia in chronic kidney disease: Secondary | ICD-10-CM | POA: Diagnosis not present

## 2022-03-07 DIAGNOSIS — N186 End stage renal disease: Secondary | ICD-10-CM | POA: Diagnosis not present

## 2022-03-07 DIAGNOSIS — E1122 Type 2 diabetes mellitus with diabetic chronic kidney disease: Secondary | ICD-10-CM | POA: Diagnosis not present

## 2022-03-09 DIAGNOSIS — R52 Pain, unspecified: Secondary | ICD-10-CM | POA: Diagnosis not present

## 2022-03-09 DIAGNOSIS — N186 End stage renal disease: Secondary | ICD-10-CM | POA: Diagnosis not present

## 2022-03-09 DIAGNOSIS — Z992 Dependence on renal dialysis: Secondary | ICD-10-CM | POA: Diagnosis not present

## 2022-03-09 DIAGNOSIS — N2581 Secondary hyperparathyroidism of renal origin: Secondary | ICD-10-CM | POA: Diagnosis not present

## 2022-03-09 DIAGNOSIS — E1110 Type 2 diabetes mellitus with ketoacidosis without coma: Secondary | ICD-10-CM | POA: Diagnosis not present

## 2022-03-11 DIAGNOSIS — N2581 Secondary hyperparathyroidism of renal origin: Secondary | ICD-10-CM | POA: Diagnosis not present

## 2022-03-11 DIAGNOSIS — N186 End stage renal disease: Secondary | ICD-10-CM | POA: Diagnosis not present

## 2022-03-11 DIAGNOSIS — E1110 Type 2 diabetes mellitus with ketoacidosis without coma: Secondary | ICD-10-CM | POA: Diagnosis not present

## 2022-03-11 DIAGNOSIS — R52 Pain, unspecified: Secondary | ICD-10-CM | POA: Diagnosis not present

## 2022-03-11 DIAGNOSIS — Z992 Dependence on renal dialysis: Secondary | ICD-10-CM | POA: Diagnosis not present

## 2022-03-14 ENCOUNTER — Telehealth: Payer: Self-pay | Admitting: Internal Medicine

## 2022-03-14 DIAGNOSIS — Z992 Dependence on renal dialysis: Secondary | ICD-10-CM | POA: Diagnosis not present

## 2022-03-14 DIAGNOSIS — R52 Pain, unspecified: Secondary | ICD-10-CM | POA: Diagnosis not present

## 2022-03-14 DIAGNOSIS — N186 End stage renal disease: Secondary | ICD-10-CM | POA: Diagnosis not present

## 2022-03-14 DIAGNOSIS — E1110 Type 2 diabetes mellitus with ketoacidosis without coma: Secondary | ICD-10-CM | POA: Diagnosis not present

## 2022-03-14 DIAGNOSIS — N2581 Secondary hyperparathyroidism of renal origin: Secondary | ICD-10-CM | POA: Diagnosis not present

## 2022-03-14 NOTE — Telephone Encounter (Signed)
Left message for patient to call back and schedule Medicare Annual Wellness Visit (AWV) either virtually or in office.  Left both my jabber number [phone] and office number    Last AWV ;05/13/21  please schedule at anytime with [facility]

## 2022-03-15 ENCOUNTER — Ambulatory Visit: Payer: Medicare Other

## 2022-03-15 DIAGNOSIS — R778 Other specified abnormalities of plasma proteins: Secondary | ICD-10-CM | POA: Diagnosis not present

## 2022-03-16 ENCOUNTER — Other Ambulatory Visit: Payer: Self-pay | Admitting: Internal Medicine

## 2022-03-16 DIAGNOSIS — Z992 Dependence on renal dialysis: Secondary | ICD-10-CM | POA: Diagnosis not present

## 2022-03-16 DIAGNOSIS — N186 End stage renal disease: Secondary | ICD-10-CM | POA: Diagnosis not present

## 2022-03-16 DIAGNOSIS — R778 Other specified abnormalities of plasma proteins: Secondary | ICD-10-CM

## 2022-03-16 DIAGNOSIS — E1110 Type 2 diabetes mellitus with ketoacidosis without coma: Secondary | ICD-10-CM | POA: Diagnosis not present

## 2022-03-16 DIAGNOSIS — N2581 Secondary hyperparathyroidism of renal origin: Secondary | ICD-10-CM | POA: Diagnosis not present

## 2022-03-16 DIAGNOSIS — R52 Pain, unspecified: Secondary | ICD-10-CM | POA: Diagnosis not present

## 2022-03-16 LAB — KAPPA/LAMBDA LIGHT CHAINS
Ig Kappa Free Light Chain: 81.1 mg/L — ABNORMAL HIGH (ref 3.3–19.4)
Ig Lambda Free Light Chain: 146.1 mg/L — ABNORMAL HIGH (ref 5.7–26.3)
KAPPA/LAMBDA RATIO: 0.56 (ref 0.26–1.65)

## 2022-03-18 DIAGNOSIS — N186 End stage renal disease: Secondary | ICD-10-CM | POA: Diagnosis not present

## 2022-03-18 DIAGNOSIS — E1110 Type 2 diabetes mellitus with ketoacidosis without coma: Secondary | ICD-10-CM | POA: Diagnosis not present

## 2022-03-18 DIAGNOSIS — R52 Pain, unspecified: Secondary | ICD-10-CM | POA: Diagnosis not present

## 2022-03-18 DIAGNOSIS — N2581 Secondary hyperparathyroidism of renal origin: Secondary | ICD-10-CM | POA: Diagnosis not present

## 2022-03-18 DIAGNOSIS — Z992 Dependence on renal dialysis: Secondary | ICD-10-CM | POA: Diagnosis not present

## 2022-03-21 ENCOUNTER — Telehealth: Payer: Self-pay | Admitting: Hematology and Oncology

## 2022-03-21 DIAGNOSIS — N186 End stage renal disease: Secondary | ICD-10-CM | POA: Diagnosis not present

## 2022-03-21 DIAGNOSIS — Z992 Dependence on renal dialysis: Secondary | ICD-10-CM | POA: Diagnosis not present

## 2022-03-21 DIAGNOSIS — N2581 Secondary hyperparathyroidism of renal origin: Secondary | ICD-10-CM | POA: Diagnosis not present

## 2022-03-21 DIAGNOSIS — R52 Pain, unspecified: Secondary | ICD-10-CM | POA: Diagnosis not present

## 2022-03-21 DIAGNOSIS — E1110 Type 2 diabetes mellitus with ketoacidosis without coma: Secondary | ICD-10-CM | POA: Diagnosis not present

## 2022-03-21 NOTE — Telephone Encounter (Signed)
Scheduled appt per 8/9 referral. Pt's daughter is aware of appt date and time. Pt's daughter is aware to arrive 15 mins prior to appt time and to bring and updated insurance card. Pt's daughter is aware of appt location.

## 2022-03-23 DIAGNOSIS — N186 End stage renal disease: Secondary | ICD-10-CM | POA: Diagnosis not present

## 2022-03-23 DIAGNOSIS — Z992 Dependence on renal dialysis: Secondary | ICD-10-CM | POA: Diagnosis not present

## 2022-03-23 DIAGNOSIS — R52 Pain, unspecified: Secondary | ICD-10-CM | POA: Diagnosis not present

## 2022-03-23 DIAGNOSIS — E1110 Type 2 diabetes mellitus with ketoacidosis without coma: Secondary | ICD-10-CM | POA: Diagnosis not present

## 2022-03-23 DIAGNOSIS — N2581 Secondary hyperparathyroidism of renal origin: Secondary | ICD-10-CM | POA: Diagnosis not present

## 2022-03-25 DIAGNOSIS — N2581 Secondary hyperparathyroidism of renal origin: Secondary | ICD-10-CM | POA: Diagnosis not present

## 2022-03-25 DIAGNOSIS — Z992 Dependence on renal dialysis: Secondary | ICD-10-CM | POA: Diagnosis not present

## 2022-03-25 DIAGNOSIS — R52 Pain, unspecified: Secondary | ICD-10-CM | POA: Diagnosis not present

## 2022-03-25 DIAGNOSIS — N186 End stage renal disease: Secondary | ICD-10-CM | POA: Diagnosis not present

## 2022-03-25 DIAGNOSIS — E1110 Type 2 diabetes mellitus with ketoacidosis without coma: Secondary | ICD-10-CM | POA: Diagnosis not present

## 2022-03-28 DIAGNOSIS — E1110 Type 2 diabetes mellitus with ketoacidosis without coma: Secondary | ICD-10-CM | POA: Diagnosis not present

## 2022-03-28 DIAGNOSIS — R52 Pain, unspecified: Secondary | ICD-10-CM | POA: Diagnosis not present

## 2022-03-28 DIAGNOSIS — N186 End stage renal disease: Secondary | ICD-10-CM | POA: Diagnosis not present

## 2022-03-28 DIAGNOSIS — N2581 Secondary hyperparathyroidism of renal origin: Secondary | ICD-10-CM | POA: Diagnosis not present

## 2022-03-28 DIAGNOSIS — Z992 Dependence on renal dialysis: Secondary | ICD-10-CM | POA: Diagnosis not present

## 2022-03-30 ENCOUNTER — Other Ambulatory Visit: Payer: Self-pay

## 2022-03-30 DIAGNOSIS — R52 Pain, unspecified: Secondary | ICD-10-CM | POA: Diagnosis not present

## 2022-03-30 DIAGNOSIS — N2581 Secondary hyperparathyroidism of renal origin: Secondary | ICD-10-CM | POA: Diagnosis not present

## 2022-03-30 DIAGNOSIS — N186 End stage renal disease: Secondary | ICD-10-CM | POA: Diagnosis not present

## 2022-03-30 DIAGNOSIS — E1110 Type 2 diabetes mellitus with ketoacidosis without coma: Secondary | ICD-10-CM | POA: Diagnosis not present

## 2022-03-30 DIAGNOSIS — Z992 Dependence on renal dialysis: Secondary | ICD-10-CM | POA: Diagnosis not present

## 2022-03-30 MED ORDER — APIXABAN 2.5 MG PO TABS: ORAL_TABLET | ORAL | 2 refills | Status: DC

## 2022-03-30 MED ORDER — AMLODIPINE BESYLATE 10 MG PO TABS: ORAL_TABLET | ORAL | 2 refills | Status: DC

## 2022-03-30 MED ORDER — METOPROLOL TARTRATE 25 MG PO TABS: ORAL_TABLET | ORAL | 2 refills | Status: DC

## 2022-04-01 DIAGNOSIS — Z992 Dependence on renal dialysis: Secondary | ICD-10-CM | POA: Diagnosis not present

## 2022-04-01 DIAGNOSIS — N186 End stage renal disease: Secondary | ICD-10-CM | POA: Diagnosis not present

## 2022-04-01 DIAGNOSIS — R52 Pain, unspecified: Secondary | ICD-10-CM | POA: Diagnosis not present

## 2022-04-01 DIAGNOSIS — E1110 Type 2 diabetes mellitus with ketoacidosis without coma: Secondary | ICD-10-CM | POA: Diagnosis not present

## 2022-04-01 DIAGNOSIS — N2581 Secondary hyperparathyroidism of renal origin: Secondary | ICD-10-CM | POA: Diagnosis not present

## 2022-04-04 DIAGNOSIS — Z992 Dependence on renal dialysis: Secondary | ICD-10-CM | POA: Diagnosis not present

## 2022-04-04 DIAGNOSIS — N186 End stage renal disease: Secondary | ICD-10-CM | POA: Diagnosis not present

## 2022-04-04 DIAGNOSIS — N2581 Secondary hyperparathyroidism of renal origin: Secondary | ICD-10-CM | POA: Diagnosis not present

## 2022-04-04 DIAGNOSIS — R52 Pain, unspecified: Secondary | ICD-10-CM | POA: Diagnosis not present

## 2022-04-04 DIAGNOSIS — E1110 Type 2 diabetes mellitus with ketoacidosis without coma: Secondary | ICD-10-CM | POA: Diagnosis not present

## 2022-04-05 ENCOUNTER — Other Ambulatory Visit: Payer: Self-pay

## 2022-04-05 MED ORDER — APIXABAN 2.5 MG PO TABS: ORAL_TABLET | ORAL | 1 refills | Status: DC

## 2022-04-06 DIAGNOSIS — N2581 Secondary hyperparathyroidism of renal origin: Secondary | ICD-10-CM | POA: Diagnosis not present

## 2022-04-06 DIAGNOSIS — R52 Pain, unspecified: Secondary | ICD-10-CM | POA: Diagnosis not present

## 2022-04-06 DIAGNOSIS — Z992 Dependence on renal dialysis: Secondary | ICD-10-CM | POA: Diagnosis not present

## 2022-04-06 DIAGNOSIS — E1110 Type 2 diabetes mellitus with ketoacidosis without coma: Secondary | ICD-10-CM | POA: Diagnosis not present

## 2022-04-06 DIAGNOSIS — N186 End stage renal disease: Secondary | ICD-10-CM | POA: Diagnosis not present

## 2022-04-07 DIAGNOSIS — E1122 Type 2 diabetes mellitus with diabetic chronic kidney disease: Secondary | ICD-10-CM | POA: Diagnosis not present

## 2022-04-07 DIAGNOSIS — N186 End stage renal disease: Secondary | ICD-10-CM | POA: Diagnosis not present

## 2022-04-07 DIAGNOSIS — Z992 Dependence on renal dialysis: Secondary | ICD-10-CM | POA: Diagnosis not present

## 2022-04-08 DIAGNOSIS — N2581 Secondary hyperparathyroidism of renal origin: Secondary | ICD-10-CM | POA: Diagnosis not present

## 2022-04-08 DIAGNOSIS — Z992 Dependence on renal dialysis: Secondary | ICD-10-CM | POA: Diagnosis not present

## 2022-04-08 DIAGNOSIS — R52 Pain, unspecified: Secondary | ICD-10-CM | POA: Diagnosis not present

## 2022-04-08 DIAGNOSIS — N186 End stage renal disease: Secondary | ICD-10-CM | POA: Diagnosis not present

## 2022-04-08 DIAGNOSIS — E1110 Type 2 diabetes mellitus with ketoacidosis without coma: Secondary | ICD-10-CM | POA: Diagnosis not present

## 2022-04-08 DIAGNOSIS — D631 Anemia in chronic kidney disease: Secondary | ICD-10-CM | POA: Diagnosis not present

## 2022-04-11 DIAGNOSIS — R52 Pain, unspecified: Secondary | ICD-10-CM | POA: Diagnosis not present

## 2022-04-11 DIAGNOSIS — N2581 Secondary hyperparathyroidism of renal origin: Secondary | ICD-10-CM | POA: Diagnosis not present

## 2022-04-11 DIAGNOSIS — E1110 Type 2 diabetes mellitus with ketoacidosis without coma: Secondary | ICD-10-CM | POA: Diagnosis not present

## 2022-04-11 DIAGNOSIS — N186 End stage renal disease: Secondary | ICD-10-CM | POA: Diagnosis not present

## 2022-04-11 DIAGNOSIS — D631 Anemia in chronic kidney disease: Secondary | ICD-10-CM | POA: Diagnosis not present

## 2022-04-11 DIAGNOSIS — Z992 Dependence on renal dialysis: Secondary | ICD-10-CM | POA: Diagnosis not present

## 2022-04-13 DIAGNOSIS — Z992 Dependence on renal dialysis: Secondary | ICD-10-CM | POA: Diagnosis not present

## 2022-04-13 DIAGNOSIS — N2581 Secondary hyperparathyroidism of renal origin: Secondary | ICD-10-CM | POA: Diagnosis not present

## 2022-04-13 DIAGNOSIS — E1110 Type 2 diabetes mellitus with ketoacidosis without coma: Secondary | ICD-10-CM | POA: Diagnosis not present

## 2022-04-13 DIAGNOSIS — N186 End stage renal disease: Secondary | ICD-10-CM | POA: Diagnosis not present

## 2022-04-13 DIAGNOSIS — R52 Pain, unspecified: Secondary | ICD-10-CM | POA: Diagnosis not present

## 2022-04-13 DIAGNOSIS — D631 Anemia in chronic kidney disease: Secondary | ICD-10-CM | POA: Diagnosis not present

## 2022-04-15 DIAGNOSIS — E1110 Type 2 diabetes mellitus with ketoacidosis without coma: Secondary | ICD-10-CM | POA: Diagnosis not present

## 2022-04-15 DIAGNOSIS — N2581 Secondary hyperparathyroidism of renal origin: Secondary | ICD-10-CM | POA: Diagnosis not present

## 2022-04-15 DIAGNOSIS — R52 Pain, unspecified: Secondary | ICD-10-CM | POA: Diagnosis not present

## 2022-04-15 DIAGNOSIS — N186 End stage renal disease: Secondary | ICD-10-CM | POA: Diagnosis not present

## 2022-04-15 DIAGNOSIS — Z992 Dependence on renal dialysis: Secondary | ICD-10-CM | POA: Diagnosis not present

## 2022-04-15 DIAGNOSIS — D631 Anemia in chronic kidney disease: Secondary | ICD-10-CM | POA: Diagnosis not present

## 2022-04-18 DIAGNOSIS — R52 Pain, unspecified: Secondary | ICD-10-CM | POA: Diagnosis not present

## 2022-04-18 DIAGNOSIS — E1110 Type 2 diabetes mellitus with ketoacidosis without coma: Secondary | ICD-10-CM | POA: Diagnosis not present

## 2022-04-18 DIAGNOSIS — D631 Anemia in chronic kidney disease: Secondary | ICD-10-CM | POA: Diagnosis not present

## 2022-04-18 DIAGNOSIS — N186 End stage renal disease: Secondary | ICD-10-CM | POA: Diagnosis not present

## 2022-04-18 DIAGNOSIS — N2581 Secondary hyperparathyroidism of renal origin: Secondary | ICD-10-CM | POA: Diagnosis not present

## 2022-04-18 DIAGNOSIS — Z992 Dependence on renal dialysis: Secondary | ICD-10-CM | POA: Diagnosis not present

## 2022-04-19 ENCOUNTER — Other Ambulatory Visit: Payer: Self-pay

## 2022-04-19 DIAGNOSIS — R413 Other amnesia: Secondary | ICD-10-CM

## 2022-04-19 DIAGNOSIS — E785 Hyperlipidemia, unspecified: Secondary | ICD-10-CM

## 2022-04-19 MED ORDER — METOPROLOL TARTRATE 25 MG PO TABS: ORAL_TABLET | ORAL | 2 refills | Status: DC

## 2022-04-19 MED ORDER — AMLODIPINE BESYLATE 10 MG PO TABS: ORAL_TABLET | ORAL | 2 refills | Status: DC

## 2022-04-19 MED ORDER — APIXABAN 2.5 MG PO TABS: ORAL_TABLET | ORAL | 1 refills | Status: DC

## 2022-04-19 MED ORDER — DONEPEZIL HCL 5 MG PO TABS: ORAL_TABLET | ORAL | 0 refills | Status: DC

## 2022-04-19 MED ORDER — ATORVASTATIN CALCIUM 20 MG PO TABS: ORAL_TABLET | ORAL | 1 refills | Status: DC

## 2022-04-20 DIAGNOSIS — D631 Anemia in chronic kidney disease: Secondary | ICD-10-CM | POA: Diagnosis not present

## 2022-04-20 DIAGNOSIS — Z992 Dependence on renal dialysis: Secondary | ICD-10-CM | POA: Diagnosis not present

## 2022-04-20 DIAGNOSIS — E1110 Type 2 diabetes mellitus with ketoacidosis without coma: Secondary | ICD-10-CM | POA: Diagnosis not present

## 2022-04-20 DIAGNOSIS — N2581 Secondary hyperparathyroidism of renal origin: Secondary | ICD-10-CM | POA: Diagnosis not present

## 2022-04-20 DIAGNOSIS — R52 Pain, unspecified: Secondary | ICD-10-CM | POA: Diagnosis not present

## 2022-04-20 DIAGNOSIS — N186 End stage renal disease: Secondary | ICD-10-CM | POA: Diagnosis not present

## 2022-04-21 ENCOUNTER — Other Ambulatory Visit: Payer: Self-pay

## 2022-04-21 ENCOUNTER — Inpatient Hospital Stay: Payer: Medicare Other | Attending: Hematology and Oncology | Admitting: Hematology and Oncology

## 2022-04-21 DIAGNOSIS — I132 Hypertensive heart and chronic kidney disease with heart failure and with stage 5 chronic kidney disease, or end stage renal disease: Secondary | ICD-10-CM | POA: Diagnosis not present

## 2022-04-21 DIAGNOSIS — Z992 Dependence on renal dialysis: Secondary | ICD-10-CM | POA: Insufficient documentation

## 2022-04-21 DIAGNOSIS — Z90710 Acquired absence of both cervix and uterus: Secondary | ICD-10-CM | POA: Diagnosis not present

## 2022-04-21 DIAGNOSIS — N183 Chronic kidney disease, stage 3 unspecified: Secondary | ICD-10-CM | POA: Diagnosis not present

## 2022-04-21 DIAGNOSIS — I509 Heart failure, unspecified: Secondary | ICD-10-CM | POA: Diagnosis not present

## 2022-04-21 DIAGNOSIS — E1122 Type 2 diabetes mellitus with diabetic chronic kidney disease: Secondary | ICD-10-CM | POA: Insufficient documentation

## 2022-04-21 DIAGNOSIS — D472 Monoclonal gammopathy: Secondary | ICD-10-CM | POA: Diagnosis not present

## 2022-04-21 NOTE — Progress Notes (Signed)
[facility] NOTE  Patient Care Team: [name], MD as PCP - General (Internal Medicine) [facility], [facility]  CHIEF COMPLAINTS/PURPOSE OF CONSULTATION:  Elevated monoclonal protein  HISTORY OF PRESENTING ILLNESS:  [name] [name] 86 y.o. female is here because of recent diagnosis of elevated monoclonal protein.  Patient has history of chronic kidney disease on hemodialysis secondary to diabetic nephropathy.  She had extensive blood work which revealed that she has an elevated monoclonal protein of 1.6 g IgG lambda.  She was referred to [place] for evaluation for myeloma versus MGUS.  She is in a wheelchair.  She had a fracture of her hip in January after a fall and had to undergo surgery.  She is accompanied today by her son who is a physician in [facility] and her daughter.  I reviewed her records extensively and collaborated the history with the patient.    MEDICAL HISTORY:  Past Medical History:  Diagnosis Date   Arthritis    RA   CHF (congestive heart failure) (HCC)    CKD (chronic kidney disease) stage 3, GFR 30-59 ml/min (HCC)    M-W-F dialysis   Dementia (HCC)    Diabetes mellitus without complication (HCC)    insulin dependent   Hyperlipidemia    Hypertension    Seasonal allergies    Shortness of breath dyspnea    occasional with exertion - no oxygen   Sleep apnea    does not use cpap   TIA (transient ischemic attack) 2015    SURGICAL HISTORY: Past Surgical History:  Procedure Laterality Date   ABDOMINAL HYSTERECTOMY  1974   AV FISTULA PLACEMENT Left 12/10/2020   Procedure: INSERTION OF ARTERIOVENOUS (AV) GORE-TEX GRAFT LEFT ARM;  Surgeon: Serafina Mitchell, MD;  Location: [facility] OR;  Service: Vascular;  Laterality: Left;   COLONOSCOPY     EYE SURGERY Bilateral    cataracts removed   IR FLUORO GUIDE CV LINE RIGHT  07/29/2020   IR FLUORO GUIDE CV LINE RIGHT  08/04/2020   IR US GUIDE VASC ACCESS RIGHT  07/29/2020    MULTIPLE TOOTH EXTRACTIONS     TOTAL HIP ARTHROPLASTY Right 07/23/2021   Procedure: TOTAL HIP ARTHROPLASTY ANTERIOR APPROACH;  Surgeon: Rod Can, MD;  Location: [place];  Service: Orthopedics;  Laterality: Right;    SOCIAL HISTORY: Social History   Socioeconomic History   Marital status: Married    Spouse name: Not on file   Number of children: 6   Years of education: 12th   Highest education level: Not on file  Occupational History   Occupation: retired    Fish farm manager: RETIRED  Tobacco Use   Smoking status: Never   Smokeless tobacco: Never  Vaping Use   Vaping Use: Never used  Substance and Sexual Activity   Alcohol use: No    Alcohol/week: 0.0 standard drinks of alcohol   Drug use: No   Sexual activity: Not Currently    Birth control/protection: Surgical    Comment: Hysterectomy  Other Topics Concern   Not on file  Social History Narrative   Patient lives with her husband    Patient is right handed   Patient drinks tea   Social Determinants of Health   Financial Resource Strain: Low Risk  (05/13/2021)   Overall Financial Resource Strain (CARDIA)    Difficulty of Paying Living Expenses: Not hard at all  Food Insecurity: No Food Insecurity (05/13/2021)   Hunger Vital Sign    Worried  About Running Out of Food in the Last Year: Never true    Ran Out of Food in the Last Year: Never true  Transportation Needs: No Transportation Needs (05/13/2021)   PRAPARE - Hydrologist (Medical): No    Lack of Transportation (Non-Medical): No  Physical Activity: Inactive (05/13/2021)   Exercise Vital Sign    Days of Exercise per Week: 0 days    Minutes of Exercise per Session: 0 min  Stress: No Stress Concern Present (05/13/2021)   [place]    Feeling of Stress : Not at all  Social Connections: Not on file  Intimate Partner Violence: Not At Risk (01/24/2019)   Humiliation, Afraid, Rape,  and Kick questionnaire    Fear of Current or Ex-Partner: No    Emotionally Abused: No    Physically Abused: No    Sexually Abused: No    FAMILY HISTORY: Family History  Problem Relation Age of Onset   Diabetes type II Mother    Hypertension Mother    Diabetes type II Father    Hypertension Other    Hyperlipidemia Other    Stroke Other     ALLERGIES:  is allergic to penicillins.  MEDICATIONS:  Current Outpatient Medications  Medication Sig Dispense Refill   amLODipine (NORVASC) 10 MG tablet TAKE 1 TABLET(10 MG) BY MOUTH DAILY 90 tablet 2   apixaban (ELIQUIS) 2.5 MG TABS tablet TAKE 1 TABLET(2.5 MG) BY MOUTH TWICE DAILY 180 tablet 1   ascorbic acid (VITAMIN C) 500 MG tablet Take 1 tablet (500 mg total) by mouth daily.     atorvastatin (LIPITOR) 20 MG tablet TAKE 1 TABLET(20 MG) BY MOUTH DAILY AT 6 PM FOR CHOLESTEROL Strength: 20 mg 90 tablet 1   blood glucose meter kit and supplies KIT Dispense based on patient and insurance preference. Use up to four times daily as directed. (FOR ICD-9 250.00, 250.01). 1 each 0   Blood Pressure Monitor KIT Use as directed to check blood pressure as needed 1 kit 1   cephALEXin (KEFLEX) 500 MG capsule Take 1 capsule (500 mg total) by mouth 2 (two) times daily. Take after dialysis on dialysis days (Patient not taking: Reported on 03/01/2022) 14 capsule 0   cetirizine (ZYRTEC) 10 MG tablet Take 10 mg by mouth daily.     cloNIDine (CATAPRES) 0.1 MG tablet Take 0.1 mg by mouth daily.     donepezil (ARICEPT) 5 MG tablet TAKE 1 TABLET(5 MG) BY MOUTH AT BEDTIME 90 tablet 0   glucose blood test strip Use as directed to check blood sugars 3 times per day dx: e11.65 (Patient taking differently: 1 each by Other route See admin instructions. Use as directed to check blood sugars 3 times per day dx: e11.65) 100 each 12   insulin detemir (LEVEMIR FLEXTOUCH) 100 UNIT/ML FlexPen Inject 8 Units into the skin at bedtime. Please follow-up with your PCP as an outpatient  to check if you continue to need the insulin.  Do not take insulin when your sugar is low.  Monitor your sugars at home (Patient taking differently: Inject 5 Units into the skin at bedtime. Please follow-up with your PCP as an outpatient to check if you continue to need the insulin.  Do not take insulin when your sugar is low.  Monitor your sugars at home)     lanthanum (FOSRENOL) 1000 MG chewable tablet Chew 1,000 mg by mouth 3 (three) times daily with  meals.     lidocaine (LMX) 4 % cream SMARTSIG:Sparingly Topical As Directed     loratadine (CLARITIN) 10 MG tablet Take 10 mg by mouth daily.     metoprolol tartrate (LOPRESSOR) 25 MG tablet TAKE 1/2 TABLET(12.5 MG) BY MOUTH TWICE DAILY 90 tablet 2   polyethylene glycol (MIRALAX) 17 g packet Take 17 g by mouth daily as needed. 14 each 0   zinc sulfate 220 (50 Zn) MG capsule Take 1 capsule (220 mg total) by mouth daily.     No current facility-administered medications for this visit.    REVIEW OF SYSTEMS:   Constitutional: Denies fevers, chills or abnormal night sweats   All other systems were reviewed with the patient and are negative.  PHYSICAL EXAMINATION: ECOG PERFORMANCE STATUS: 2 - Symptomatic, <50% confined to bed  Vitals:   04/21/22 1548  BP: (!) 190/77  Pulse: 83  Resp: 18  Temp: (!) 97.2 F (36.2 C)  SpO2: 100%   Filed Weights   04/21/22 1548  Weight: 136 lb 9.6 oz (62 kg)    GENERAL:alert, no distress and comfortable    LABORATORY DATA:  I have reviewed the data as listed Lab Results  Component Value Date   WBC 4.0 03/01/2022   HGB 11.5 03/01/2022   HCT 36.0 03/01/2022   MCV 80 03/01/2022   PLT 174 03/01/2022   Lab Results  Component Value Date   NA 137 03/01/2022   K 3.8 03/01/2022   CL 95 (L) 03/01/2022   CO2 26 03/01/2022    RADIOGRAPHIC STUDIES: I have personally reviewed the radiological reports and agreed with the findings in the report.  ASSESSMENT AND PLAN:  MGUS (monoclonal gammopathy of  unknown significance) Lab review 03/01/2022: M spike 1.6 g, creatinine 5.33, hemoglobin 11.5, calcium 9.3, Kappa: 81, lambda 146, ratio 0.56  Differential diagnosis: MGUS versus myeloma (patient has chronic kidney disease due to diabetes) Recommendation: 1.  Bone survey 2. if there is any suspicious changes in the bones and we will consider doing a bone marrow biopsy.  Counseling: I discussed with the patient the spectrum of disorders from MGUS to multiple myeloma. We discussed the role of plasma cells in producing immunoglobulins. We discussed structure of immunoglobulins on how they make up the heavy chains and the light chains. The light chains are Kappa and lambda. I discussed the difference between MGUS and multiple myeloma. MGUS is characterized by elevation monoclonal protein without any end organ damage. Multiple myeloma is associated with elevation monoclonal protein and end organ damage (hypercalcemia, renal dysfunction, anemia, bone lytic lesions ) along with a bone marrow showing greater than 10% plasma cells.  Follow-up after the bone survey to discuss results. All questions were answered. The patient knows to call the clinic with any problems, questions or concerns.    [name], MD 04/21/22

## 2022-04-21 NOTE — Assessment & Plan Note (Signed)
Lab review 03/01/2022: M spike 1.6 g, creatinine 5.33, hemoglobin 11.5, calcium 9.3, Kappa: 81, lambda 146, ratio 0.56  Differential diagnosis: MGUS versus myeloma (patient has chronic kidney disease due to diabetes) Recommendation: 1.  Bone survey 2. if there is any suspicious changes in the bones and we will consider doing a bone marrow biopsy.  Counseling: I discussed with the patient the spectrum of disorders from MGUS to multiple myeloma. We discussed the role of plasma cells in producing immunoglobulins. We discussed structure of immunoglobulins on how they make up the heavy chains and the light chains. The light chains are Kappa and lambda. I discussed the difference between MGUS and multiple myeloma. MGUS is characterized by elevation monoclonal protein without any end organ damage. Multiple myeloma is associated with elevation monoclonal protein and end organ damage (hypercalcemia, renal dysfunction, anemia, bone lytic lesions ) along with a bone marrow showing greater than 10% plasma cells.

## 2022-04-22 ENCOUNTER — Telehealth: Payer: Self-pay | Admitting: Hematology and Oncology

## 2022-04-22 DIAGNOSIS — N186 End stage renal disease: Secondary | ICD-10-CM | POA: Diagnosis not present

## 2022-04-22 DIAGNOSIS — Z992 Dependence on renal dialysis: Secondary | ICD-10-CM | POA: Diagnosis not present

## 2022-04-22 DIAGNOSIS — E1110 Type 2 diabetes mellitus with ketoacidosis without coma: Secondary | ICD-10-CM | POA: Diagnosis not present

## 2022-04-22 DIAGNOSIS — N2581 Secondary hyperparathyroidism of renal origin: Secondary | ICD-10-CM | POA: Diagnosis not present

## 2022-04-22 DIAGNOSIS — D631 Anemia in chronic kidney disease: Secondary | ICD-10-CM | POA: Diagnosis not present

## 2022-04-22 DIAGNOSIS — R52 Pain, unspecified: Secondary | ICD-10-CM | POA: Diagnosis not present

## 2022-04-22 NOTE — Telephone Encounter (Signed)
Contacted patient to scheduled appointments. Left message with appointment details and a call back number if patient had any questions or could not accommodate the time we provided.   

## 2022-04-25 DIAGNOSIS — Z992 Dependence on renal dialysis: Secondary | ICD-10-CM | POA: Diagnosis not present

## 2022-04-25 DIAGNOSIS — D631 Anemia in chronic kidney disease: Secondary | ICD-10-CM | POA: Diagnosis not present

## 2022-04-25 DIAGNOSIS — R52 Pain, unspecified: Secondary | ICD-10-CM | POA: Diagnosis not present

## 2022-04-25 DIAGNOSIS — N186 End stage renal disease: Secondary | ICD-10-CM | POA: Diagnosis not present

## 2022-04-25 DIAGNOSIS — E1110 Type 2 diabetes mellitus with ketoacidosis without coma: Secondary | ICD-10-CM | POA: Diagnosis not present

## 2022-04-25 DIAGNOSIS — N2581 Secondary hyperparathyroidism of renal origin: Secondary | ICD-10-CM | POA: Diagnosis not present

## 2022-04-27 DIAGNOSIS — N2581 Secondary hyperparathyroidism of renal origin: Secondary | ICD-10-CM | POA: Diagnosis not present

## 2022-04-27 DIAGNOSIS — N186 End stage renal disease: Secondary | ICD-10-CM | POA: Diagnosis not present

## 2022-04-27 DIAGNOSIS — Z992 Dependence on renal dialysis: Secondary | ICD-10-CM | POA: Diagnosis not present

## 2022-04-27 DIAGNOSIS — R52 Pain, unspecified: Secondary | ICD-10-CM | POA: Diagnosis not present

## 2022-04-27 DIAGNOSIS — D631 Anemia in chronic kidney disease: Secondary | ICD-10-CM | POA: Diagnosis not present

## 2022-04-27 DIAGNOSIS — E1110 Type 2 diabetes mellitus with ketoacidosis without coma: Secondary | ICD-10-CM | POA: Diagnosis not present

## 2022-04-29 ENCOUNTER — Telehealth: Payer: Self-pay | Admitting: Hematology and Oncology

## 2022-04-29 DIAGNOSIS — D631 Anemia in chronic kidney disease: Secondary | ICD-10-CM | POA: Diagnosis not present

## 2022-04-29 DIAGNOSIS — R52 Pain, unspecified: Secondary | ICD-10-CM | POA: Diagnosis not present

## 2022-04-29 DIAGNOSIS — N2581 Secondary hyperparathyroidism of renal origin: Secondary | ICD-10-CM | POA: Diagnosis not present

## 2022-04-29 DIAGNOSIS — E1110 Type 2 diabetes mellitus with ketoacidosis without coma: Secondary | ICD-10-CM | POA: Diagnosis not present

## 2022-04-29 DIAGNOSIS — N186 End stage renal disease: Secondary | ICD-10-CM | POA: Diagnosis not present

## 2022-04-29 DIAGNOSIS — Z992 Dependence on renal dialysis: Secondary | ICD-10-CM | POA: Diagnosis not present

## 2022-04-29 NOTE — Telephone Encounter (Signed)
Rescheduled appointment per patients daughter. Patients daughter is aware of the changes made to her moms upcoming appointment.

## 2022-05-02 ENCOUNTER — Ambulatory Visit: Payer: Medicare Other | Admitting: Hematology and Oncology

## 2022-05-02 DIAGNOSIS — N186 End stage renal disease: Secondary | ICD-10-CM | POA: Diagnosis not present

## 2022-05-02 DIAGNOSIS — R52 Pain, unspecified: Secondary | ICD-10-CM | POA: Diagnosis not present

## 2022-05-02 DIAGNOSIS — D631 Anemia in chronic kidney disease: Secondary | ICD-10-CM | POA: Diagnosis not present

## 2022-05-02 DIAGNOSIS — N2581 Secondary hyperparathyroidism of renal origin: Secondary | ICD-10-CM | POA: Diagnosis not present

## 2022-05-02 DIAGNOSIS — Z992 Dependence on renal dialysis: Secondary | ICD-10-CM | POA: Diagnosis not present

## 2022-05-02 DIAGNOSIS — E1110 Type 2 diabetes mellitus with ketoacidosis without coma: Secondary | ICD-10-CM | POA: Diagnosis not present

## 2022-05-03 ENCOUNTER — Inpatient Hospital Stay: Payer: Medicare Other | Admitting: Hematology and Oncology

## 2022-05-03 ENCOUNTER — Telehealth: Payer: Self-pay | Admitting: *Deleted

## 2022-05-03 NOTE — Telephone Encounter (Signed)
Per MD request appt for today canceled due to pt not undergoing bone survey.  RN scheduled appt for bone survey and attempt x1 to contact pt.  No answer, LVM with appt details.

## 2022-05-03 NOTE — Assessment & Plan Note (Deleted)
Lab review 03/01/2022: M spike 1.6 g, creatinine 5.33, hemoglobin 11.5, calcium 9.3, Kappa: 81, lambda 146, ratio 0.56  Differential diagnosis: MGUS versus myeloma (patient has chronic kidney disease due to diabetes) Recommendation: 1.  Bone survey 2. if there is any suspicious changes in the bones and we will consider doing a bone marrow biopsy.

## 2022-05-04 DIAGNOSIS — E1110 Type 2 diabetes mellitus with ketoacidosis without coma: Secondary | ICD-10-CM | POA: Diagnosis not present

## 2022-05-04 DIAGNOSIS — Z992 Dependence on renal dialysis: Secondary | ICD-10-CM | POA: Diagnosis not present

## 2022-05-04 DIAGNOSIS — R52 Pain, unspecified: Secondary | ICD-10-CM | POA: Diagnosis not present

## 2022-05-04 DIAGNOSIS — D631 Anemia in chronic kidney disease: Secondary | ICD-10-CM | POA: Diagnosis not present

## 2022-05-04 DIAGNOSIS — N186 End stage renal disease: Secondary | ICD-10-CM | POA: Diagnosis not present

## 2022-05-04 DIAGNOSIS — N2581 Secondary hyperparathyroidism of renal origin: Secondary | ICD-10-CM | POA: Diagnosis not present

## 2022-05-06 ENCOUNTER — Other Ambulatory Visit (HOSPITAL_COMMUNITY): Payer: Medicare Other

## 2022-05-06 DIAGNOSIS — N2581 Secondary hyperparathyroidism of renal origin: Secondary | ICD-10-CM | POA: Diagnosis not present

## 2022-05-06 DIAGNOSIS — R52 Pain, unspecified: Secondary | ICD-10-CM | POA: Diagnosis not present

## 2022-05-06 DIAGNOSIS — D631 Anemia in chronic kidney disease: Secondary | ICD-10-CM | POA: Diagnosis not present

## 2022-05-06 DIAGNOSIS — N186 End stage renal disease: Secondary | ICD-10-CM | POA: Diagnosis not present

## 2022-05-06 DIAGNOSIS — E1110 Type 2 diabetes mellitus with ketoacidosis without coma: Secondary | ICD-10-CM | POA: Diagnosis not present

## 2022-05-06 DIAGNOSIS — Z992 Dependence on renal dialysis: Secondary | ICD-10-CM | POA: Diagnosis not present

## 2022-05-07 DIAGNOSIS — N186 End stage renal disease: Secondary | ICD-10-CM | POA: Diagnosis not present

## 2022-05-07 DIAGNOSIS — Z992 Dependence on renal dialysis: Secondary | ICD-10-CM | POA: Diagnosis not present

## 2022-05-07 DIAGNOSIS — E1122 Type 2 diabetes mellitus with diabetic chronic kidney disease: Secondary | ICD-10-CM | POA: Diagnosis not present

## 2022-05-09 ENCOUNTER — Telehealth: Payer: Medicare Other | Admitting: Hematology and Oncology

## 2022-05-09 DIAGNOSIS — R52 Pain, unspecified: Secondary | ICD-10-CM | POA: Diagnosis not present

## 2022-05-09 DIAGNOSIS — Z992 Dependence on renal dialysis: Secondary | ICD-10-CM | POA: Diagnosis not present

## 2022-05-09 DIAGNOSIS — N2581 Secondary hyperparathyroidism of renal origin: Secondary | ICD-10-CM | POA: Diagnosis not present

## 2022-05-09 DIAGNOSIS — Z23 Encounter for immunization: Secondary | ICD-10-CM | POA: Diagnosis not present

## 2022-05-09 DIAGNOSIS — D631 Anemia in chronic kidney disease: Secondary | ICD-10-CM | POA: Diagnosis not present

## 2022-05-09 DIAGNOSIS — N186 End stage renal disease: Secondary | ICD-10-CM | POA: Diagnosis not present

## 2022-05-09 DIAGNOSIS — E1110 Type 2 diabetes mellitus with ketoacidosis without coma: Secondary | ICD-10-CM | POA: Diagnosis not present

## 2022-05-11 DIAGNOSIS — Z23 Encounter for immunization: Secondary | ICD-10-CM | POA: Diagnosis not present

## 2022-05-11 DIAGNOSIS — N2581 Secondary hyperparathyroidism of renal origin: Secondary | ICD-10-CM | POA: Diagnosis not present

## 2022-05-11 DIAGNOSIS — E1110 Type 2 diabetes mellitus with ketoacidosis without coma: Secondary | ICD-10-CM | POA: Diagnosis not present

## 2022-05-11 DIAGNOSIS — Z992 Dependence on renal dialysis: Secondary | ICD-10-CM | POA: Diagnosis not present

## 2022-05-11 DIAGNOSIS — D631 Anemia in chronic kidney disease: Secondary | ICD-10-CM | POA: Diagnosis not present

## 2022-05-11 DIAGNOSIS — R52 Pain, unspecified: Secondary | ICD-10-CM | POA: Diagnosis not present

## 2022-05-11 DIAGNOSIS — N186 End stage renal disease: Secondary | ICD-10-CM | POA: Diagnosis not present

## 2022-05-12 ENCOUNTER — Ambulatory Visit (HOSPITAL_COMMUNITY)
Admission: RE | Admit: 2022-05-12 | Discharge: 2022-05-12 | Disposition: A | Discharge status: Home / Self Care | Payer: Medicare Other | Source: Ambulatory Visit | Attending: Hematology and Oncology | Admitting: Hematology and Oncology

## 2022-05-12 DIAGNOSIS — D472 Monoclonal gammopathy: Secondary | ICD-10-CM | POA: Diagnosis not present

## 2022-05-13 DIAGNOSIS — E1110 Type 2 diabetes mellitus with ketoacidosis without coma: Secondary | ICD-10-CM | POA: Diagnosis not present

## 2022-05-13 DIAGNOSIS — N2581 Secondary hyperparathyroidism of renal origin: Secondary | ICD-10-CM | POA: Diagnosis not present

## 2022-05-13 DIAGNOSIS — Z992 Dependence on renal dialysis: Secondary | ICD-10-CM | POA: Diagnosis not present

## 2022-05-13 DIAGNOSIS — N186 End stage renal disease: Secondary | ICD-10-CM | POA: Diagnosis not present

## 2022-05-13 DIAGNOSIS — D631 Anemia in chronic kidney disease: Secondary | ICD-10-CM | POA: Diagnosis not present

## 2022-05-13 DIAGNOSIS — Z23 Encounter for immunization: Secondary | ICD-10-CM | POA: Diagnosis not present

## 2022-05-13 DIAGNOSIS — R52 Pain, unspecified: Secondary | ICD-10-CM | POA: Diagnosis not present

## 2022-05-13 NOTE — Progress Notes (Signed)
HEMATOLOGY-ONCOLOGY TELEPHONE VISIT PROGRESS NOTE  I connected with our patient on 05/17/22 at  2:30 PM EDT by telephone and verified that I am speaking with the correct person using two identifiers.  I discussed the limitations, risks, security and privacy concerns of performing an evaluation and management service by telephone and the availability of in person appointments.  I also discussed with the patient that there may be a patient responsible charge related to this service. The patient expressed understanding and agreed to proceed.   History of Present Illness: [name] [name] 86 y.o. female diagnosis of elevated monoclonal protein. Patient has history of chronic kidney disease on hemodialysis secondary to diabetic nephropathy.  She had extensive blood work which revealed that she has an elevated monoclonal protein of 1.6 g IgG lambda. She reports to the clinic via phone to discuss the bone survey.   REVIEW OF SYSTEMS:   Constitutional: Denies fevers, chills or abnormal weight loss All other systems were reviewed with the patient and are negative. Observations/Objective:     Assessment Plan:  MGUS (monoclonal gammopathy of unknown significance) Lab review 03/01/2022: M spike 1.6 g, creatinine 5.33, hemoglobin 11.5, calcium 9.3, Kappa: 81, lambda 146, ratio 0.56 05/14/2022: Bone survey: No bone lytic lesions identified.  Degenerative changes.  Based on the above results we concluded that the patient has MGUS.  We did not intend to do a bone marrow biopsy at this time.  Recheck labs in 1 year and follow-up 1 week later with a telephone visit.     I discussed the assessment and treatment plan with the patient. The patient was provided an opportunity to ask questions and all were answered. The patient agreed with the plan and demonstrated an understanding of the instructions. The patient was advised to call back or seek an in-person evaluation if the symptoms worsen or if the  condition fails to improve as anticipated.   I provided 12 minutes of non-face-to-face time during this encounter.  This includes time for charting and coordination of care   [name] Ohara, MD  I [name] [name] am scribing for Dr. Lindi [name]  I have reviewed the above documentation for accuracy and completeness, and I agree with the above.

## 2022-05-16 DIAGNOSIS — N2581 Secondary hyperparathyroidism of renal origin: Secondary | ICD-10-CM | POA: Diagnosis not present

## 2022-05-16 DIAGNOSIS — D631 Anemia in chronic kidney disease: Secondary | ICD-10-CM | POA: Diagnosis not present

## 2022-05-16 DIAGNOSIS — R52 Pain, unspecified: Secondary | ICD-10-CM | POA: Diagnosis not present

## 2022-05-16 DIAGNOSIS — E1110 Type 2 diabetes mellitus with ketoacidosis without coma: Secondary | ICD-10-CM | POA: Diagnosis not present

## 2022-05-16 DIAGNOSIS — Z992 Dependence on renal dialysis: Secondary | ICD-10-CM | POA: Diagnosis not present

## 2022-05-16 DIAGNOSIS — N186 End stage renal disease: Secondary | ICD-10-CM | POA: Diagnosis not present

## 2022-05-16 DIAGNOSIS — Z23 Encounter for immunization: Secondary | ICD-10-CM | POA: Diagnosis not present

## 2022-05-17 ENCOUNTER — Inpatient Hospital Stay: Payer: Medicare Other | Attending: Hematology and Oncology | Admitting: Hematology and Oncology

## 2022-05-17 DIAGNOSIS — D472 Monoclonal gammopathy: Secondary | ICD-10-CM | POA: Diagnosis not present

## 2022-05-17 NOTE — Assessment & Plan Note (Addendum)
Lab review 03/01/2022: M spike 1.6 g, creatinine 5.33, hemoglobin 11.5, calcium 9.3, Kappa: 81, lambda 146, ratio 0.56 05/14/2022: Bone survey: No bone lytic lesions identified.  Degenerative changes.  Based on the above results we concluded that the patient has MGUS.  We did not intend to do a bone marrow biopsy at this time.  Recheck labs in 6 months and follow-up 1 week later.  If those labs are stable then we can see her annually.

## 2022-05-18 ENCOUNTER — Telehealth: Payer: Self-pay | Admitting: Hematology and Oncology

## 2022-05-18 DIAGNOSIS — D631 Anemia in chronic kidney disease: Secondary | ICD-10-CM | POA: Diagnosis not present

## 2022-05-18 DIAGNOSIS — Z23 Encounter for immunization: Secondary | ICD-10-CM | POA: Diagnosis not present

## 2022-05-18 DIAGNOSIS — R52 Pain, unspecified: Secondary | ICD-10-CM | POA: Diagnosis not present

## 2022-05-18 DIAGNOSIS — Z992 Dependence on renal dialysis: Secondary | ICD-10-CM | POA: Diagnosis not present

## 2022-05-18 DIAGNOSIS — N186 End stage renal disease: Secondary | ICD-10-CM | POA: Diagnosis not present

## 2022-05-18 DIAGNOSIS — E1110 Type 2 diabetes mellitus with ketoacidosis without coma: Secondary | ICD-10-CM | POA: Diagnosis not present

## 2022-05-18 DIAGNOSIS — N2581 Secondary hyperparathyroidism of renal origin: Secondary | ICD-10-CM | POA: Diagnosis not present

## 2022-05-18 NOTE — Telephone Encounter (Signed)
Scheduled appointment per 10/10 los. Left voicemail.

## 2022-05-19 ENCOUNTER — Ambulatory Visit (INDEPENDENT_AMBULATORY_CARE_PROVIDER_SITE_OTHER): Payer: Medicare Other

## 2022-05-19 VITALS — BP 160/68 | HR 83 | Temp 98.1°F | Ht 59.0 in | Wt 137.4 lb

## 2022-05-19 DIAGNOSIS — Z0000 Encounter for general adult medical examination without abnormal findings: Secondary | ICD-10-CM | POA: Diagnosis not present

## 2022-05-19 NOTE — Patient Instructions (Signed)
[name] [name] , Thank you for taking time to come for your Medicare Wellness Visit. I appreciate your ongoing commitment to your health goals. Please review the following plan we discussed and let me know if I can assist you in the future.   Screening recommendations/referrals: Colonoscopy: not required Mammogram: not required Bone Density: due Recommended yearly ophthalmology/optometry visit for glaucoma screening and checkup Recommended yearly dental visit for hygiene and checkup  Vaccinations: Influenza vaccine: due Pneumococcal vaccine: completed 03/01/2022 Tdap vaccine: completed 07/16/2012, due 07/16/2022 Shingles vaccine: discussed   Covid-19: 06/22/2020, 09/25/2019, 08/28/2019  Advanced directives: Please bring a copy of your POA (Power of Attorney) and/or Living Will to your next appointment.    Conditions/risks identified: none  Next appointment: Follow up in one year for your annual wellness visit    Preventive Care 84 Years and Older, Female Preventive care refers to lifestyle choices and visits with your health care provider that can promote health and wellness. What does preventive care include? A yearly physical exam. This is also called an annual well check. Dental exams once or twice a year. Routine eye exams. Ask your health care provider how often you should have your eyes checked. Personal lifestyle choices, including: Daily care of your teeth and gums. Regular physical activity. Eating a healthy diet. Avoiding tobacco and drug use. Limiting alcohol use. Practicing safe sex. Taking low-dose aspirin every day. Taking vitamin and mineral supplements as recommended by your health care provider. What happens during an annual well check? The services and screenings done by your health care provider during your annual well check will depend on your age, overall health, lifestyle risk factors, and family history of disease. Counseling  Your health care provider may ask  you questions about your: Alcohol use. Tobacco use. Drug use. Emotional well-being. Home and relationship well-being. Sexual activity. Eating habits. History of falls. Memory and ability to understand (cognition). Work and work Statistician. Reproductive health. Screening  You may have the following tests or measurements: Height, weight, and BMI. Blood pressure. Lipid and cholesterol levels. These may be checked every 5 years, or more frequently if you are over 64 years old. Skin check. Lung cancer screening. You may have this screening every year starting at age 65 if you have a 30-pack-year history of smoking and currently smoke or have quit within the past 15 years. Fecal occult blood test (FOBT) of the stool. You may have this test every year starting at age 78. Flexible sigmoidoscopy or colonoscopy. You may have a sigmoidoscopy every 5 years or a colonoscopy every 10 years starting at age 60. Hepatitis C blood test. Hepatitis B blood test. Sexually transmitted disease (STD) testing. Diabetes screening. This is done by checking your blood sugar (glucose) after you have not eaten for a while (fasting). You may have this done every 1-3 years. Bone density scan. This is done to screen for osteoporosis. You may have this done starting at age 23. Mammogram. This may be done every 1-2 years. Talk to your health care provider about how often you should have regular mammograms. Talk with your health care provider about your test results, treatment options, and if necessary, the need for more tests. Vaccines  Your health care provider may recommend certain vaccines, such as: Influenza vaccine. This is recommended every year. Tetanus, diphtheria, and acellular pertussis (Tdap, Td) vaccine. You may need a Td booster every 10 years. Zoster vaccine. You may need this after age 47. Pneumococcal 13-valent conjugate (PCV13) vaccine. One dose is  recommended after age 66. Pneumococcal  polysaccharide (PPSV23) vaccine. One dose is recommended after age 43. Talk to your health care provider about which screenings and vaccines you need and how often you need them. This information is not intended to replace advice given to you by your health care provider. Make sure you discuss any questions you have with your health care provider. Document Released: 08/21/2015 Document Revised: 04/13/2016 Document Reviewed: 05/26/2015 Elsevier Interactive Patient Education  2017 [place] Prevention in the Home Falls can cause injuries. They can happen to people of all ages. There are many things you can do to make your home safe and to help prevent falls. What can I do on the outside of my home? Regularly fix the edges of walkways and driveways and fix any cracks. Remove anything that might make you trip as you walk through a door, such as a raised step or threshold. Trim any bushes or trees on the path to your home. Use bright outdoor lighting. Clear any walking paths of anything that might make someone trip, such as rocks or tools. Regularly check to see if handrails are loose or broken. Make sure that both sides of any steps have handrails. Any raised decks and porches should have guardrails on the edges. Have any leaves, snow, or ice cleared regularly. Use sand or salt on walking paths during winter. Clean up any spills in your garage right away. This includes oil or grease spills. What can I do in the bathroom? Use night lights. Install grab bars by the toilet and in the tub and shower. Do not use towel bars as grab bars. Use non-skid mats or decals in the tub or shower. If you need to sit down in the shower, use a plastic, non-slip stool. Keep the floor dry. Clean up any water that spills on the floor as soon as it happens. Remove soap buildup in the tub or shower regularly. Attach bath mats securely with double-sided non-slip rug tape. Do not have throw rugs and other  things on the floor that can make you trip. What can I do in the bedroom? Use night lights. Make sure that you have a light by your bed that is easy to reach. Do not use any sheets or blankets that are too big for your bed. They should not hang down onto the floor. Have a firm chair that has side arms. You can use this for support while you get dressed. Do not have throw rugs and other things on the floor that can make you trip. What can I do in the kitchen? Clean up any spills right away. Avoid walking on wet floors. Keep items that you use a lot in easy-to-reach places. If you need to reach something above you, use a strong step stool that has a grab bar. Keep electrical cords out of the way. Do not use floor polish or wax that makes floors slippery. If you must use wax, use non-skid floor wax. Do not have throw rugs and other things on the floor that can make you trip. What can I do with my stairs? Do not leave any items on the stairs. Make sure that there are handrails on both sides of the stairs and use them. Fix handrails that are broken or loose. Make sure that handrails are as [place] as the stairways. Check any carpeting to make sure that it is firmly attached to the stairs. Fix any carpet that is loose or worn. Avoid having  throw rugs at the top or bottom of the stairs. If you do have throw rugs, attach them to the floor with carpet tape. Make sure that you have a light switch at the top of the stairs and the bottom of the stairs. If you do not have them, ask someone to add them for you. What else can I do to help prevent falls? Wear shoes that: Do not have high heels. Have rubber bottoms. Are comfortable and fit you well. Are closed at the toe. Do not wear sandals. If you use a stepladder: Make sure that it is fully opened. Do not climb a closed stepladder. Make sure that both sides of the stepladder are locked into place. Ask someone to hold it for you, if possible. Clearly  mark and make sure that you can see: Any grab bars or handrails. First and last steps. Where the edge of each step is. Use tools that help you move around (mobility aids) if they are needed. These include: Canes. Walkers. Scooters. Crutches. Turn on the lights when you go into a dark area. Replace any light bulbs as soon as they burn out. Set up your furniture so you have a clear path. Avoid moving your furniture around. If any of your floors are uneven, fix them. If there are any pets around you, be aware of where they are. Review your medicines with your doctor. Some medicines can make you feel dizzy. This can increase your chance of falling. Ask your doctor what other things that you can do to help prevent falls. This information is not intended to replace advice given to you by your health care provider. Make sure you discuss any questions you have with your health care provider. Document Released: 05/21/2009 Document Revised: 12/31/2015 Document Reviewed: 08/29/2014 Elsevier Interactive Patient Education  2017 [employer].

## 2022-05-19 NOTE — Progress Notes (Signed)
Subjective:   [name] [name] is a 86 y.o. female who presents for Medicare Annual (Subsequent) preventive examination.  Review of Systems     Cardiac Risk Factors include: advanced age (>53mn, >>3women);diabetes mellitus;dyslipidemia;hypertension     Objective:    Today's Vitals   05/19/22 1600 05/19/22 1624  BP: (!) 160/70 (!) 160/68  Pulse: 83   Temp: 98.1 F (36.7 C)   TempSrc: Oral   SpO2: 99%   Weight: 137 lb 6.4 oz (62.3 kg)   Height: '4\' 11"'  (1.499 m)    Body mass index is 27.75 kg/m.     05/19/2022    4:11 PM 12/18/2021   12:04 PM 08/20/2021   10:45 PM 08/20/2021    5:48 PM 07/22/2021   12:00 PM 05/13/2021    2:44 PM 10/15/2020   11:43 AM  Advanced Directives  Does Patient Have a Medical Advance Directive? Yes No Yes Yes No No Yes  Type [place]    Does patient want to make changes to medical advance directive?       No - Guardian declined  Copy of [place] Chart? No - copy requested        Would patient like information on creating a medical advance directive?     No - Patient declined      Current Medications (verified) Outpatient Encounter Medications as of 05/19/2022  Medication Sig   amLODipine (NORVASC) 10 MG tablet TAKE 1 TABLET(10 MG) BY MOUTH DAILY   apixaban (ELIQUIS) 2.5 MG TABS tablet TAKE 1 TABLET(2.5 MG) BY MOUTH TWICE DAILY   ascorbic acid (VITAMIN C) 500 MG tablet Take 1 tablet (500 mg total) by mouth daily.   atorvastatin (LIPITOR) 20 MG tablet TAKE 1 TABLET(20 MG) BY MOUTH DAILY AT 6 PM FOR CHOLESTEROL Strength: 20 mg   blood glucose meter kit and supplies KIT Dispense based on patient and insurance preference. Use up to four times daily as directed. (FOR ICD-9 250.00, 250.01).   Blood Pressure Monitor KIT Use as directed to check blood pressure as needed   cetirizine (ZYRTEC) 10 MG tablet Take 10  mg by mouth daily.   cloNIDine (CATAPRES) 0.1 MG tablet Take 0.1 mg by mouth daily.   donepezil (ARICEPT) 5 MG tablet TAKE 1 TABLET(5 MG) BY MOUTH AT BEDTIME   glucose blood test strip Use as directed to check blood sugars 3 times per day dx: e11.65 (Patient taking differently: 1 each by Other route See admin instructions. Use as directed to check blood sugars 3 times per day dx: e11.65)   lanthanum (FOSRENOL) 1000 MG chewable tablet Chew 1,000 mg by mouth 3 (three) times daily with meals. Takes twice daily   lidocaine (LMX) 4 % cream SMARTSIG:Sparingly Topical As Directed   loratadine (CLARITIN) 10 MG tablet Take 10 mg by mouth daily.   metoprolol tartrate (LOPRESSOR) 25 MG tablet TAKE 1/2 TABLET(12.5 MG) BY MOUTH TWICE DAILY   polyethylene glycol (MIRALAX) 17 g packet Take 17 g by mouth daily as needed.   zinc sulfate 220 (50 Zn) MG capsule Take 1 capsule (220 mg total) by mouth daily.   cephALEXin (KEFLEX) 500 MG capsule Take 1 capsule (500 mg total) by mouth 2 (two) times daily. Take after dialysis on dialysis days (Patient not taking: Reported on 03/01/2022)   insulin detemir (LEVEMIR FLEXTOUCH) 100 UNIT/ML FlexPen Inject 8 Units into the  skin at bedtime. Please follow-up with your PCP as an outpatient to check if you continue to need the insulin.  Do not take insulin when your sugar is low.  Monitor your sugars at home (Patient not taking: Reported on 05/19/2022)   No facility-administered encounter medications on file as of 05/19/2022.    Allergies (verified) Penicillins   History: Past Medical History:  Diagnosis Date   Arthritis    RA   CHF (congestive heart failure) (HCC)    CKD (chronic kidney disease) stage 3, GFR 30-59 ml/min (HCC)    M-W-F dialysis   Dementia (HCC)    Diabetes mellitus without complication (HCC)    insulin dependent   Hyperlipidemia    Hypertension    Seasonal allergies    Shortness of breath dyspnea    occasional with exertion - no oxygen   Sleep  apnea    does not use cpap   TIA (transient ischemic attack) 2015   Past Surgical History:  Procedure Laterality Date   ABDOMINAL HYSTERECTOMY  1974   AV FISTULA PLACEMENT Left 12/10/2020   Procedure: INSERTION OF ARTERIOVENOUS (AV) GORE-TEX GRAFT LEFT ARM;  Surgeon: Serafina Mitchell, MD;  Location: [facility] OR;  Service: Vascular;  Laterality: Left;   COLONOSCOPY     EYE SURGERY Bilateral    cataracts removed   IR FLUORO GUIDE CV LINE RIGHT  07/29/2020   IR FLUORO GUIDE CV LINE RIGHT  08/04/2020   IR US GUIDE VASC ACCESS RIGHT  07/29/2020   MULTIPLE TOOTH EXTRACTIONS     TOTAL HIP ARTHROPLASTY Right 07/23/2021   Procedure: TOTAL HIP ARTHROPLASTY ANTERIOR APPROACH;  Surgeon: Rod Can, MD;  Location: [facility];  Service: Orthopedics;  Laterality: Right;   Family History  Problem Relation Age of Onset   Diabetes type II Mother    Hypertension Mother    Diabetes type II Father    Hypertension Other    Hyperlipidemia Other    Stroke Other    Social History   Socioeconomic History   Marital status: Widowed    Spouse name: Not on file   Number of children: 6   Years of education: 12th   Highest education level: Not on file  Occupational History   Occupation: retired    Fish farm manager: RETIRED  Tobacco Use   Smoking status: Never   Smokeless tobacco: Never  Vaping Use   Vaping Use: Never used  Substance and Sexual Activity   Alcohol use: No    Alcohol/week: 0.0 standard drinks of alcohol   Drug use: No   Sexual activity: Not Currently    Birth control/protection: Surgical    Comment: Hysterectomy  Other Topics Concern   Not on file  Social History Narrative   Patient lives with her husband    Patient is right handed   Patient drinks tea   Social Determinants of Health   Financial Resource Strain: Low Risk  (05/19/2022)   Overall Financial Resource Strain (CARDIA)    Difficulty of Paying Living Expenses: Not hard at all  Food Insecurity: No Food Insecurity (05/19/2022)    Hunger Vital Sign    Worried About Running Out of Food in the Last Year: Never true    Ran Out of Food in the Last Year: Never true  Transportation Needs: No Transportation Needs (05/19/2022)   PRAPARE - Hydrologist (Medical): No    Lack of Transportation (Non-Medical): No  Physical Activity: Inactive (05/19/2022)   Exercise Vital Sign  Days of Exercise per Week: 0 days    Minutes of Exercise per Session: 0 min  Stress: No Stress Concern Present (05/19/2022)   [place]    Feeling of Stress : Not at all  Social Connections: Not on file    Tobacco Counseling Counseling given: Not Answered   Clinical Intake:  Pre-visit preparation completed: Yes  Pain : No/denies pain     Nutritional Status: BMI 25 -29 Overweight Nutritional Risks: None Diabetes: Yes  How often do you need to have someone help you when you read instructions, pamphlets, or other written materials from your doctor or pharmacy?: 1 - Never  Diabetic? Yes Nutrition Risk Assessment:  Has the patient had any N/V/D within the last 2 months?  No  Does the patient have any non-healing wounds?  No  Has the patient had any unintentional weight loss or weight gain?  No   Diabetes:  Is the patient diabetic?  Yes  If diabetic, was a CBG obtained today?  No  Did the patient bring in their glucometer from home?  No  How often do you monitor your CBG's? Does not.   Financial Strains and Diabetes Management:  Are you having any financial strains with the device, your supplies or your medication? No .  Does the patient want to be seen by Chronic Care Management for management of their diabetes?  No  Would the patient like to be referred to a Nutritionist or for Diabetic Management?  No   Diabetic Exams:  Diabetic Eye Exam: Overdue for diabetic eye exam. Pt has been advised about the importance in completing this exam.  Patient advised to call and schedule an eye exam. Diabetic Foot Exam: Overdue, Pt has been advised about the importance in completing this exam. Pt is scheduled for diabetic foot exam on next appointment.   Interpreter Needed?: No  Comments: NAllen LPN   Activities of Daily Living    05/19/2022    4:14 PM 07/22/2021   12:00 PM  In your present state of health, do you have any difficulty performing the following activities:  Hearing? 0 0  Vision? 1 0  Comment looks dark sometimes   Difficulty concentrating or making decisions? 1 0  Walking or climbing stairs? 1 0  Dressing or bathing? 0 1  Doing errands, shopping? 1 1  Preparing Food and eating ? N   Using the Toilet? N   In the past six months, have you accidently leaked urine? N   Do you have problems with loss of bowel control? N   Managing your Medications? Y   Managing your Finances? Y   Housekeeping or managing your Housekeeping? N     Patient Care Team: [name], MD as PCP - General (Internal Medicine) Lady Gary, [place] any recent Medical Services you may have received from other than [facility] providers in the past year (date may be approximate).     Assessment:   This is a routine wellness examination for Porter.  Hearing/Vision screen Vision Screening - Comments:: No regular eye exams, [facility]  Dietary issues and exercise activities discussed: Current Exercise Habits: The patient does not participate in regular exercise at present   Goals Addressed             This Visit's Progress    Patient Stated       05/19/2022, no goals       Depression  Screen    05/19/2022    4:14 PM 05/13/2021    2:45 PM 04/01/2021    2:22 PM 01/30/2020    2:24 PM 01/24/2019    3:01 PM 01/23/2019    2:13 PM 09/27/2018   10:02 AM  PHQ 2/9 Scores  PHQ - 2 Score 0 0 0 0 0 0 0  PHQ- 9 Score   0 0       Fall Risk    05/19/2022    4:13 PM 05/13/2021    2:45 PM 04/01/2021     2:21 PM 01/30/2020    2:24 PM 01/24/2019    3:01 PM  Fall Risk   Falls in the past year? 1 0 0 0 0  Comment not really sure      Number falls in past yr: 0  0    Injury with Fall? 1  0    Comment broke hip      Risk for fall due to : Impaired balance/gait;Medication side effect Medication side effect  Medication side effect Medication side effect  Follow up Falls evaluation completed;Education provided;Falls prevention discussed Falls evaluation completed;Education provided;Falls prevention discussed  Falls evaluation completed;Education provided;Falls prevention discussed Falls prevention discussed    FALL RISK PREVENTION PERTAINING TO THE HOME:  Any stairs in or around the home? No  If so, are there any without handrails? N/a Home free of loose throw rugs in walkways, pet beds, electrical cords, etc? Yes  Adequate lighting in your home to reduce risk of falls? Yes   ASSISTIVE DEVICES UTILIZED TO PREVENT FALLS:  Life alert? No  Use of a cane, walker or w/c? No  Grab bars in the bathroom? No  Shower chair or bench in shower? Yes  Elevated toilet seat or a handicapped toilet? No   TIMED UP AND GO:  Was the test performed? Yes .  Length of time to ambulate 10 feet: 7 sec.   Gait slow and steady without use of assistive device  Cognitive Function:        09/24/2020   11:42 AM 01/30/2020    2:27 PM 01/24/2019    3:04 PM  6CIT Screen  What Year? 4 points 0 points 0 points  What month? 3 points 0 points 0 points  What time? 3 points 0 points 0 points  Count back from 20 4 points 0 points 0 points  Months in reverse 4 points 4 points 0 points  Repeat phrase 0 points 6 points 0 points  Total Score 18 points 10 points 0 points    Immunizations Immunization History  Administered Date(s) Administered   DTaP 07/16/2012   Influenza, High Dose Seasonal PF 05/12/2021   Moderna Sars-Covid-2 Vaccination 08/28/2019, 09/25/2019, 06/22/2020   Pneumococcal Conjugate-13 12/26/2017    Pneumococcal Polysaccharide-23 03/01/2022   Pneumococcal-Unspecified 07/16/2012    TDAP status: Up to date  Flu Vaccine status: Due, Education has been provided regarding the importance of this vaccine. Advised may receive this vaccine at local pharmacy or Health Dept. Aware to provide a copy of the vaccination record if obtained from local pharmacy or Health Dept. Verbalized acceptance and understanding.  Pneumococcal vaccine status: Up to date  Covid-19 vaccine status: Completed vaccines  Qualifies for Shingles Vaccine? Yes   Zostavax completed No   Shingrix Completed?: No.    Education has been provided regarding the importance of this vaccine. Patient has been advised to call insurance company to determine out of pocket expense if they have not yet received  this vaccine. Advised may also receive vaccine at local pharmacy or Health Dept. Verbalized acceptance and understanding.  Screening Tests Health Maintenance  Topic Date Due   Zoster Vaccines- Shingrix (1 of 2) Never done   DEXA SCAN  Never done   COVID-19 Vaccine (4 - Moderna risk series) 08/17/2020   OPHTHALMOLOGY EXAM  12/01/2021   INFLUENZA VACCINE  03/08/2022   FOOT EXAM  04/01/2022   TETANUS/TDAP  07/16/2022   HEMOGLOBIN A1C  09/01/2022   Pneumonia Vaccine 54+ Years old  Completed   HPV VACCINES  Aged Out    Health Maintenance  Health Maintenance Due  Topic Date Due   Zoster Vaccines- Shingrix (1 of 2) Never done   DEXA SCAN  Never done   COVID-19 Vaccine (4 - Moderna risk series) 08/17/2020   OPHTHALMOLOGY EXAM  12/01/2021   INFLUENZA VACCINE  03/08/2022   FOOT EXAM  04/01/2022    Colorectal cancer screening: No longer required.   Mammogram status: No longer required due to age.  Bone Density status: due  Lung Cancer Screening: (Low Dose CT Chest recommended if Age 31-80 years, 30 pack-year currently smoking OR have quit w/in 15years.) does not qualify.   Lung Cancer Screening Referral:  no  Additional Screening:  Hepatitis C Screening: does not qualify;   Vision Screening: Recommended annual ophthalmology exams for early detection of glaucoma and other disorders of the eye. Is the patient up to date with their annual eye exam?  No  Who is the provider or what is the name of the office in which the patient attends annual eye exams? [facility] If pt is not established with a provider, would they like to be referred to a provider to establish care? No .   Dental Screening: Recommended annual dental exams for proper oral hygiene  Community Resource Referral / Chronic Care Management: CRR required this visit?  No   CCM required this visit?  No      Plan:     I have personally reviewed and noted the following in the patient's chart:   Medical and social history Use of alcohol, tobacco or illicit drugs  Current medications and supplements including opioid prescriptions. Patient is not currently taking opioid prescriptions. Functional ability and status Nutritional status Physical activity Advanced directives List of other physicians Hospitalizations, surgeries, and ER visits in previous 12 months Vitals Screenings to include cognitive, depression, and falls Referrals and appointments  In addition, I have reviewed and discussed with patient certain preventive protocols, quality metrics, and best practice recommendations. A written personalized care plan for preventive services as well as general preventive health recommendations were provided to patient.     Kellie Simmering, LPN   06/77/0340   Nurse Notes: 6 CIT not administered. Patient has diagnosis of dementia

## 2022-05-20 DIAGNOSIS — E1110 Type 2 diabetes mellitus with ketoacidosis without coma: Secondary | ICD-10-CM | POA: Diagnosis not present

## 2022-05-20 DIAGNOSIS — D631 Anemia in chronic kidney disease: Secondary | ICD-10-CM | POA: Diagnosis not present

## 2022-05-20 DIAGNOSIS — Z992 Dependence on renal dialysis: Secondary | ICD-10-CM | POA: Diagnosis not present

## 2022-05-20 DIAGNOSIS — Z23 Encounter for immunization: Secondary | ICD-10-CM | POA: Diagnosis not present

## 2022-05-20 DIAGNOSIS — N2581 Secondary hyperparathyroidism of renal origin: Secondary | ICD-10-CM | POA: Diagnosis not present

## 2022-05-20 DIAGNOSIS — N186 End stage renal disease: Secondary | ICD-10-CM | POA: Diagnosis not present

## 2022-05-20 DIAGNOSIS — R52 Pain, unspecified: Secondary | ICD-10-CM | POA: Diagnosis not present

## 2022-05-23 DIAGNOSIS — D631 Anemia in chronic kidney disease: Secondary | ICD-10-CM | POA: Diagnosis not present

## 2022-05-23 DIAGNOSIS — R52 Pain, unspecified: Secondary | ICD-10-CM | POA: Diagnosis not present

## 2022-05-23 DIAGNOSIS — Z23 Encounter for immunization: Secondary | ICD-10-CM | POA: Diagnosis not present

## 2022-05-23 DIAGNOSIS — Z992 Dependence on renal dialysis: Secondary | ICD-10-CM | POA: Diagnosis not present

## 2022-05-23 DIAGNOSIS — E1110 Type 2 diabetes mellitus with ketoacidosis without coma: Secondary | ICD-10-CM | POA: Diagnosis not present

## 2022-05-23 DIAGNOSIS — N186 End stage renal disease: Secondary | ICD-10-CM | POA: Diagnosis not present

## 2022-05-23 DIAGNOSIS — N2581 Secondary hyperparathyroidism of renal origin: Secondary | ICD-10-CM | POA: Diagnosis not present

## 2022-05-25 DIAGNOSIS — D631 Anemia in chronic kidney disease: Secondary | ICD-10-CM | POA: Diagnosis not present

## 2022-05-25 DIAGNOSIS — N2581 Secondary hyperparathyroidism of renal origin: Secondary | ICD-10-CM | POA: Diagnosis not present

## 2022-05-25 DIAGNOSIS — R52 Pain, unspecified: Secondary | ICD-10-CM | POA: Diagnosis not present

## 2022-05-25 DIAGNOSIS — Z23 Encounter for immunization: Secondary | ICD-10-CM | POA: Diagnosis not present

## 2022-05-25 DIAGNOSIS — Z992 Dependence on renal dialysis: Secondary | ICD-10-CM | POA: Diagnosis not present

## 2022-05-25 DIAGNOSIS — E1110 Type 2 diabetes mellitus with ketoacidosis without coma: Secondary | ICD-10-CM | POA: Diagnosis not present

## 2022-05-25 DIAGNOSIS — N186 End stage renal disease: Secondary | ICD-10-CM | POA: Diagnosis not present

## 2022-05-27 DIAGNOSIS — E1110 Type 2 diabetes mellitus with ketoacidosis without coma: Secondary | ICD-10-CM | POA: Diagnosis not present

## 2022-05-27 DIAGNOSIS — Z23 Encounter for immunization: Secondary | ICD-10-CM | POA: Diagnosis not present

## 2022-05-27 DIAGNOSIS — R52 Pain, unspecified: Secondary | ICD-10-CM | POA: Diagnosis not present

## 2022-05-27 DIAGNOSIS — Z992 Dependence on renal dialysis: Secondary | ICD-10-CM | POA: Diagnosis not present

## 2022-05-27 DIAGNOSIS — D631 Anemia in chronic kidney disease: Secondary | ICD-10-CM | POA: Diagnosis not present

## 2022-05-27 DIAGNOSIS — N2581 Secondary hyperparathyroidism of renal origin: Secondary | ICD-10-CM | POA: Diagnosis not present

## 2022-05-27 DIAGNOSIS — N186 End stage renal disease: Secondary | ICD-10-CM | POA: Diagnosis not present

## 2022-05-30 DIAGNOSIS — N2581 Secondary hyperparathyroidism of renal origin: Secondary | ICD-10-CM | POA: Diagnosis not present

## 2022-05-30 DIAGNOSIS — E1110 Type 2 diabetes mellitus with ketoacidosis without coma: Secondary | ICD-10-CM | POA: Diagnosis not present

## 2022-05-30 DIAGNOSIS — D631 Anemia in chronic kidney disease: Secondary | ICD-10-CM | POA: Diagnosis not present

## 2022-05-30 DIAGNOSIS — R52 Pain, unspecified: Secondary | ICD-10-CM | POA: Diagnosis not present

## 2022-05-30 DIAGNOSIS — N186 End stage renal disease: Secondary | ICD-10-CM | POA: Diagnosis not present

## 2022-05-30 DIAGNOSIS — Z992 Dependence on renal dialysis: Secondary | ICD-10-CM | POA: Diagnosis not present

## 2022-05-30 DIAGNOSIS — Z23 Encounter for immunization: Secondary | ICD-10-CM | POA: Diagnosis not present

## 2022-06-01 DIAGNOSIS — Z992 Dependence on renal dialysis: Secondary | ICD-10-CM | POA: Diagnosis not present

## 2022-06-01 DIAGNOSIS — Z23 Encounter for immunization: Secondary | ICD-10-CM | POA: Diagnosis not present

## 2022-06-01 DIAGNOSIS — R52 Pain, unspecified: Secondary | ICD-10-CM | POA: Diagnosis not present

## 2022-06-01 DIAGNOSIS — E1110 Type 2 diabetes mellitus with ketoacidosis without coma: Secondary | ICD-10-CM | POA: Diagnosis not present

## 2022-06-01 DIAGNOSIS — N2581 Secondary hyperparathyroidism of renal origin: Secondary | ICD-10-CM | POA: Diagnosis not present

## 2022-06-01 DIAGNOSIS — D631 Anemia in chronic kidney disease: Secondary | ICD-10-CM | POA: Diagnosis not present

## 2022-06-01 DIAGNOSIS — N186 End stage renal disease: Secondary | ICD-10-CM | POA: Diagnosis not present

## 2022-06-03 DIAGNOSIS — Z992 Dependence on renal dialysis: Secondary | ICD-10-CM | POA: Diagnosis not present

## 2022-06-03 DIAGNOSIS — N186 End stage renal disease: Secondary | ICD-10-CM | POA: Diagnosis not present

## 2022-06-03 DIAGNOSIS — R52 Pain, unspecified: Secondary | ICD-10-CM | POA: Diagnosis not present

## 2022-06-03 DIAGNOSIS — D631 Anemia in chronic kidney disease: Secondary | ICD-10-CM | POA: Diagnosis not present

## 2022-06-03 DIAGNOSIS — N2581 Secondary hyperparathyroidism of renal origin: Secondary | ICD-10-CM | POA: Diagnosis not present

## 2022-06-03 DIAGNOSIS — E1110 Type 2 diabetes mellitus with ketoacidosis without coma: Secondary | ICD-10-CM | POA: Diagnosis not present

## 2022-06-03 DIAGNOSIS — Z23 Encounter for immunization: Secondary | ICD-10-CM | POA: Diagnosis not present

## 2022-06-06 DIAGNOSIS — R52 Pain, unspecified: Secondary | ICD-10-CM | POA: Diagnosis not present

## 2022-06-06 DIAGNOSIS — N2581 Secondary hyperparathyroidism of renal origin: Secondary | ICD-10-CM | POA: Diagnosis not present

## 2022-06-06 DIAGNOSIS — Z992 Dependence on renal dialysis: Secondary | ICD-10-CM | POA: Diagnosis not present

## 2022-06-06 DIAGNOSIS — Z23 Encounter for immunization: Secondary | ICD-10-CM | POA: Diagnosis not present

## 2022-06-06 DIAGNOSIS — E1110 Type 2 diabetes mellitus with ketoacidosis without coma: Secondary | ICD-10-CM | POA: Diagnosis not present

## 2022-06-06 DIAGNOSIS — N186 End stage renal disease: Secondary | ICD-10-CM | POA: Diagnosis not present

## 2022-06-06 DIAGNOSIS — D631 Anemia in chronic kidney disease: Secondary | ICD-10-CM | POA: Diagnosis not present

## 2022-06-07 DIAGNOSIS — E1122 Type 2 diabetes mellitus with diabetic chronic kidney disease: Secondary | ICD-10-CM | POA: Diagnosis not present

## 2022-06-07 DIAGNOSIS — Z992 Dependence on renal dialysis: Secondary | ICD-10-CM | POA: Diagnosis not present

## 2022-06-07 DIAGNOSIS — N186 End stage renal disease: Secondary | ICD-10-CM | POA: Diagnosis not present

## 2022-06-07 IMAGING — CT CT HEAD W/O CM
4 series · 16 of 47 positions shown, 18 images · non-contrast
Comparison: CT head 01/17/2014

CLINICAL DATA: Memory loss, progressive for the last 4 months. No
malignancy.

EXAM:
CT HEAD WITHOUT CONTRAST
TECHNIQUE: Contiguous axial images were obtained from the base of the skull
through the vertex without intravenous contrast.

[Series 2: head 5.00 hr40 s3 axial ibhc · axial · 0.44mm/px · z∈[-644,-524]mm · 6 of 34 slices shown, 8 images]
[im 5/34  brain]
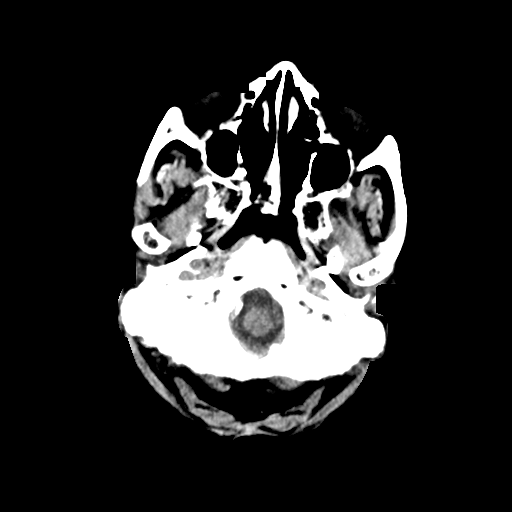
[im 5/34  bone]
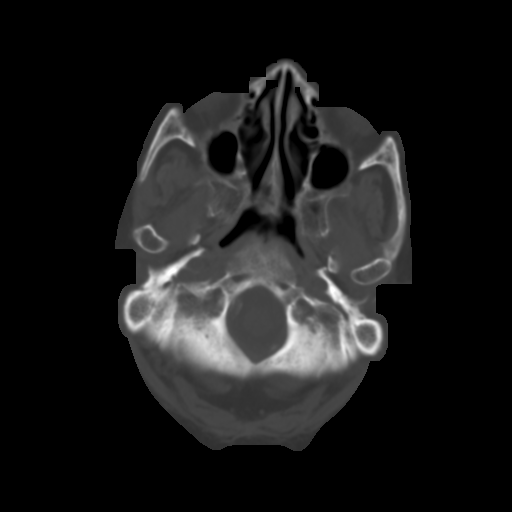
[im 10/34  brain]
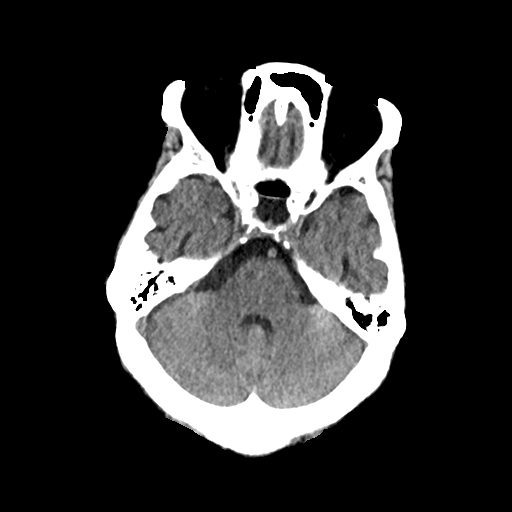
[im 15/34  brain]
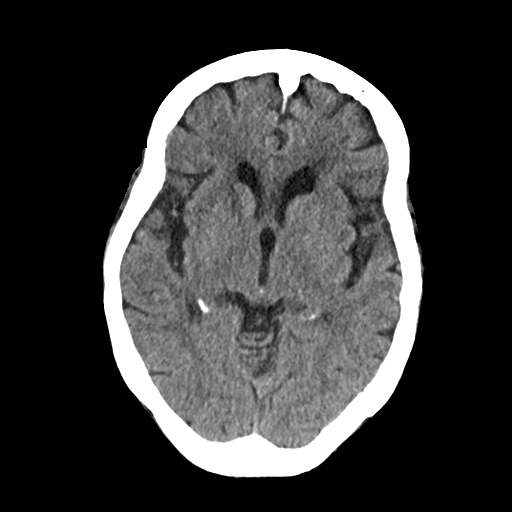
[im 19/34  brain]
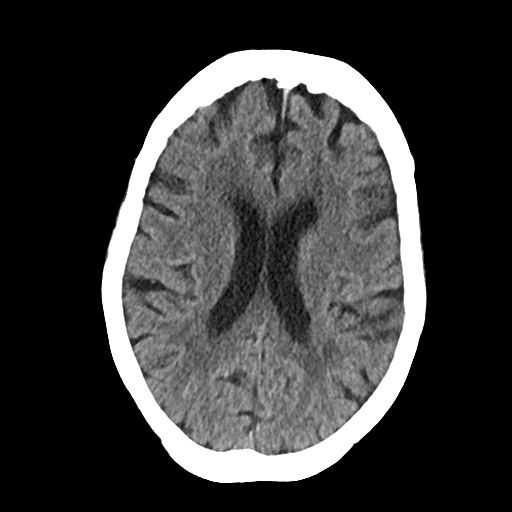
[im 24/34  brain]
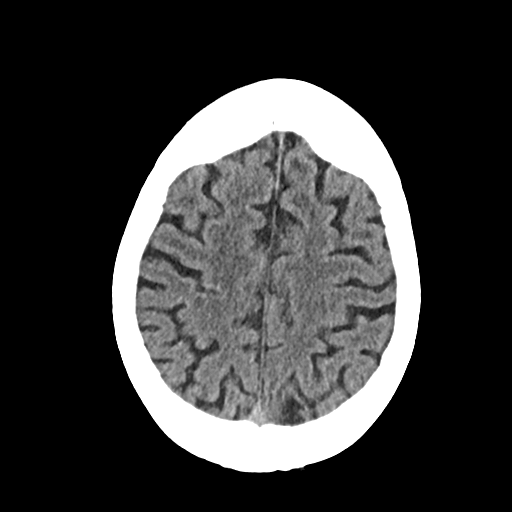
[im 24/34  bone]
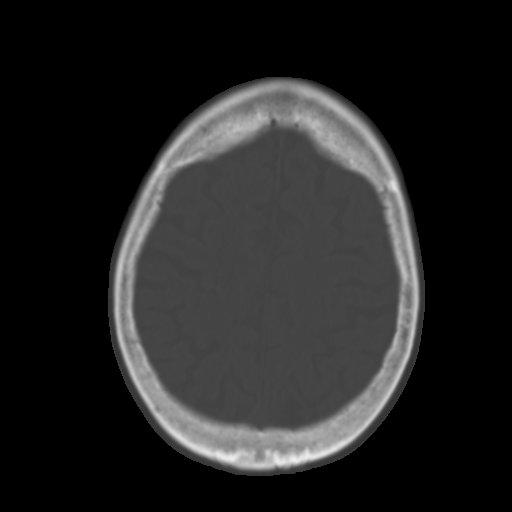
[im 29/34  brain]
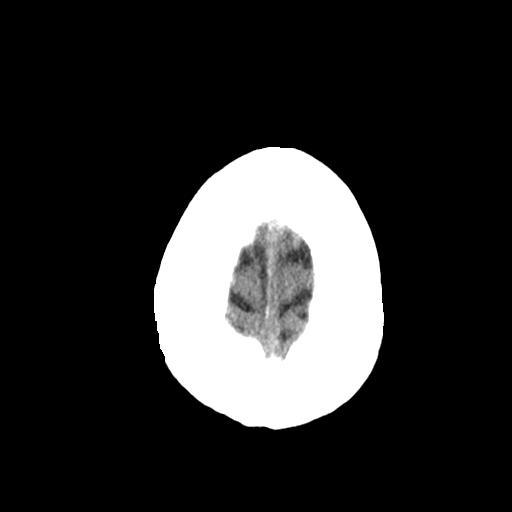

[Series 3: head 2.00 hr60 s3 axial bone · axial · 0.44mm/px · z∈[-649,-593]mm · 4 of 87 slices shown]
[im 9/87  bone]
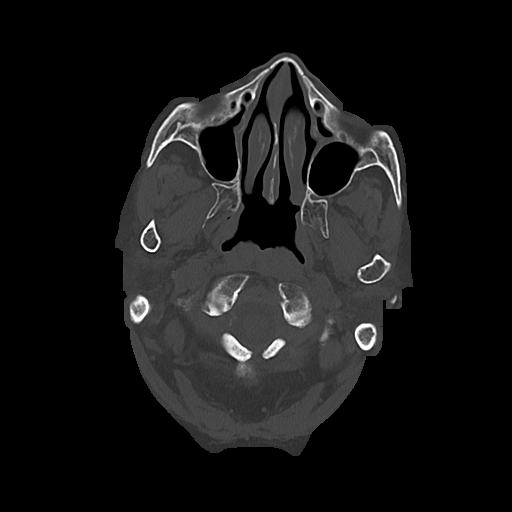
[im 17/87  bone]
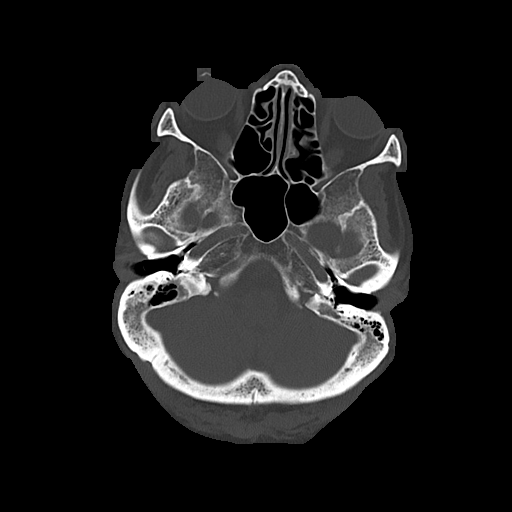
[im 29/87  bone]
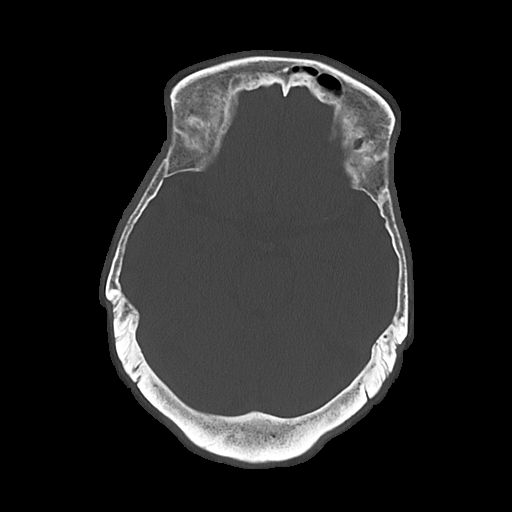
[im 37/87  bone]
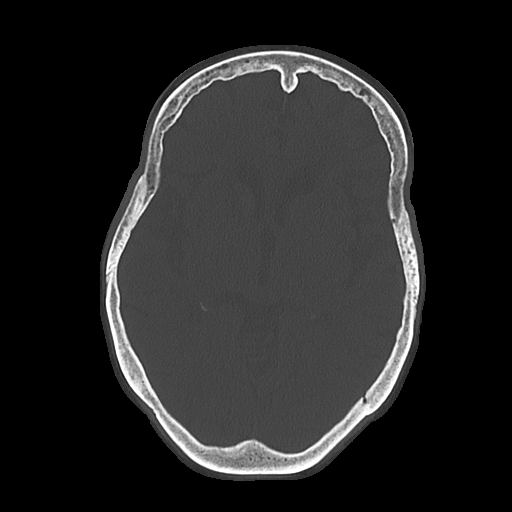

[Series 4: head 3.00 hr40 s3 sag · sagittal · 0.33mm/px · 3 of 56 slices shown]
[im 19/56  brain]
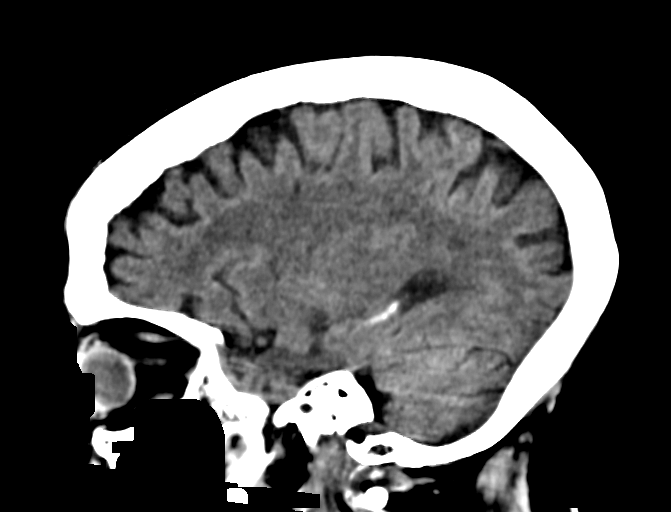
[im 28/56  brain]
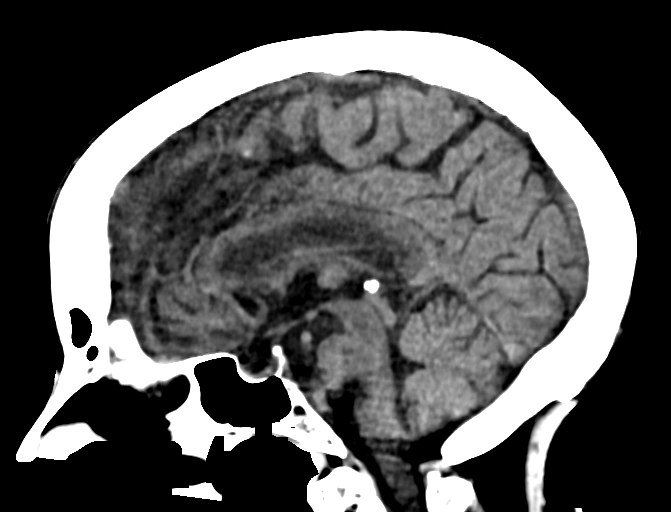
[im 37/56  brain]
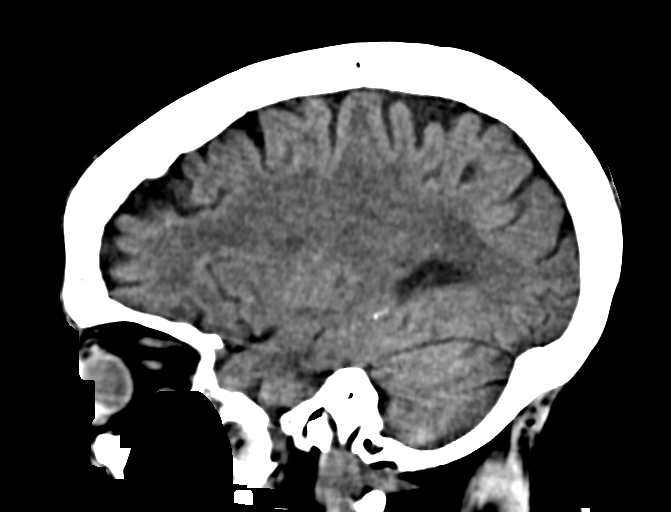

[Series 6: head 3.00 hr40 s3 cor · coronal · 0.33mm/px · 3 of 74 slices shown]
[im 25/74  brain]
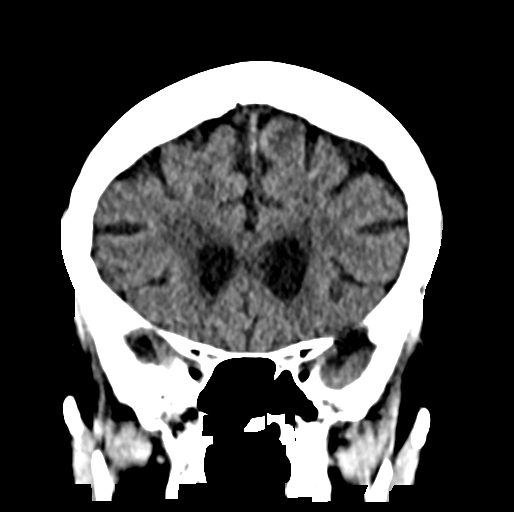
[im 33/74  brain]
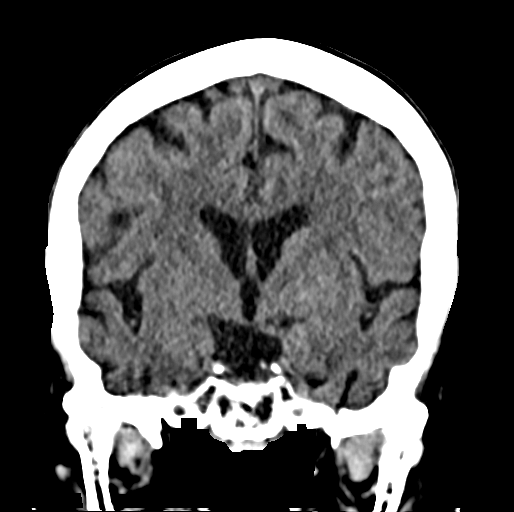
[im 41/74  brain]
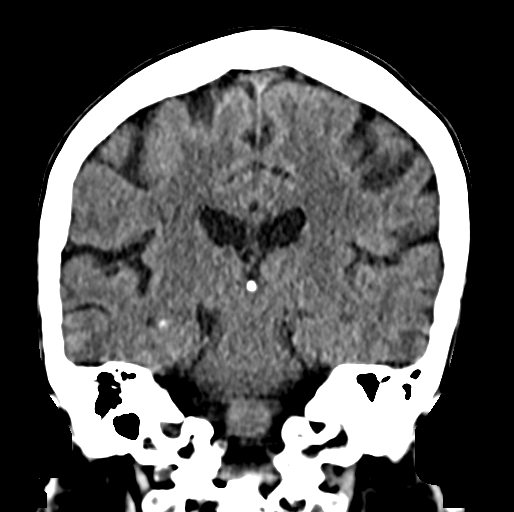

[16 of 47 positions shown; findings below may reference images not displayed]

FINDINGS: Brain:

Cerebral ventricle sizes are concordant with the degree of cerebral
volume loss-this has increased since 7583. Patchy and confluent
areas of decreased attenuation are noted throughout the deep and
periventricular white matter of the cerebral hemispheres
bilaterally, compatible with chronic microvascular ischemic disease.
Redemonstration of left posterior parietal lobe encephalomalacia.

No evidence of large-territorial acute infarction. No parenchymal
hemorrhage. No mass lesion. No extra-axial collection.

No mass effect or midline shift. No hydrocephalus. Basilar cisterns
are patent.

Vascular: No hyperdense vessel. Atherosclerotic calcifications are
present within the cavernous internal carotid arteries.

Skull: No acute fracture or focal lesion.

Sinuses/Orbits: Paranasal sinuses and mastoid air cells are clear.
Question bilateral lens replacements. Otherwise orbits are
unremarkable.

Other: None.
IMPRESSION: No acute intracranial abnormality in a patient with slightly
worsened diffuse volume loss (compared to 7583) and
similar-appearing chronic microvascular ischemic changes.

## 2022-06-08 DIAGNOSIS — N2581 Secondary hyperparathyroidism of renal origin: Secondary | ICD-10-CM | POA: Diagnosis not present

## 2022-06-08 DIAGNOSIS — E1110 Type 2 diabetes mellitus with ketoacidosis without coma: Secondary | ICD-10-CM | POA: Diagnosis not present

## 2022-06-08 DIAGNOSIS — Z992 Dependence on renal dialysis: Secondary | ICD-10-CM | POA: Diagnosis not present

## 2022-06-08 DIAGNOSIS — N186 End stage renal disease: Secondary | ICD-10-CM | POA: Diagnosis not present

## 2022-06-08 DIAGNOSIS — D631 Anemia in chronic kidney disease: Secondary | ICD-10-CM | POA: Diagnosis not present

## 2022-06-08 DIAGNOSIS — R52 Pain, unspecified: Secondary | ICD-10-CM | POA: Diagnosis not present

## 2022-06-10 DIAGNOSIS — D631 Anemia in chronic kidney disease: Secondary | ICD-10-CM | POA: Diagnosis not present

## 2022-06-10 DIAGNOSIS — R52 Pain, unspecified: Secondary | ICD-10-CM | POA: Diagnosis not present

## 2022-06-10 DIAGNOSIS — N2581 Secondary hyperparathyroidism of renal origin: Secondary | ICD-10-CM | POA: Diagnosis not present

## 2022-06-10 DIAGNOSIS — N186 End stage renal disease: Secondary | ICD-10-CM | POA: Diagnosis not present

## 2022-06-10 DIAGNOSIS — E1110 Type 2 diabetes mellitus with ketoacidosis without coma: Secondary | ICD-10-CM | POA: Diagnosis not present

## 2022-06-10 DIAGNOSIS — Z992 Dependence on renal dialysis: Secondary | ICD-10-CM | POA: Diagnosis not present

## 2022-06-13 DIAGNOSIS — E1110 Type 2 diabetes mellitus with ketoacidosis without coma: Secondary | ICD-10-CM | POA: Diagnosis not present

## 2022-06-13 DIAGNOSIS — D631 Anemia in chronic kidney disease: Secondary | ICD-10-CM | POA: Diagnosis not present

## 2022-06-13 DIAGNOSIS — R52 Pain, unspecified: Secondary | ICD-10-CM | POA: Diagnosis not present

## 2022-06-13 DIAGNOSIS — N2581 Secondary hyperparathyroidism of renal origin: Secondary | ICD-10-CM | POA: Diagnosis not present

## 2022-06-13 DIAGNOSIS — Z992 Dependence on renal dialysis: Secondary | ICD-10-CM | POA: Diagnosis not present

## 2022-06-13 DIAGNOSIS — N186 End stage renal disease: Secondary | ICD-10-CM | POA: Diagnosis not present

## 2022-06-15 DIAGNOSIS — N2581 Secondary hyperparathyroidism of renal origin: Secondary | ICD-10-CM | POA: Diagnosis not present

## 2022-06-15 DIAGNOSIS — N186 End stage renal disease: Secondary | ICD-10-CM | POA: Diagnosis not present

## 2022-06-15 DIAGNOSIS — Z992 Dependence on renal dialysis: Secondary | ICD-10-CM | POA: Diagnosis not present

## 2022-06-15 DIAGNOSIS — E1110 Type 2 diabetes mellitus with ketoacidosis without coma: Secondary | ICD-10-CM | POA: Diagnosis not present

## 2022-06-15 DIAGNOSIS — D631 Anemia in chronic kidney disease: Secondary | ICD-10-CM | POA: Diagnosis not present

## 2022-06-15 DIAGNOSIS — R52 Pain, unspecified: Secondary | ICD-10-CM | POA: Diagnosis not present

## 2022-06-17 DIAGNOSIS — E1110 Type 2 diabetes mellitus with ketoacidosis without coma: Secondary | ICD-10-CM | POA: Diagnosis not present

## 2022-06-17 DIAGNOSIS — N186 End stage renal disease: Secondary | ICD-10-CM | POA: Diagnosis not present

## 2022-06-17 DIAGNOSIS — R52 Pain, unspecified: Secondary | ICD-10-CM | POA: Diagnosis not present

## 2022-06-17 DIAGNOSIS — N2581 Secondary hyperparathyroidism of renal origin: Secondary | ICD-10-CM | POA: Diagnosis not present

## 2022-06-17 DIAGNOSIS — Z992 Dependence on renal dialysis: Secondary | ICD-10-CM | POA: Diagnosis not present

## 2022-06-17 DIAGNOSIS — D631 Anemia in chronic kidney disease: Secondary | ICD-10-CM | POA: Diagnosis not present

## 2022-06-20 DIAGNOSIS — N186 End stage renal disease: Secondary | ICD-10-CM | POA: Diagnosis not present

## 2022-06-20 DIAGNOSIS — N2581 Secondary hyperparathyroidism of renal origin: Secondary | ICD-10-CM | POA: Diagnosis not present

## 2022-06-20 DIAGNOSIS — E1110 Type 2 diabetes mellitus with ketoacidosis without coma: Secondary | ICD-10-CM | POA: Diagnosis not present

## 2022-06-20 DIAGNOSIS — R52 Pain, unspecified: Secondary | ICD-10-CM | POA: Diagnosis not present

## 2022-06-20 DIAGNOSIS — D631 Anemia in chronic kidney disease: Secondary | ICD-10-CM | POA: Diagnosis not present

## 2022-06-20 DIAGNOSIS — Z992 Dependence on renal dialysis: Secondary | ICD-10-CM | POA: Diagnosis not present

## 2022-06-22 DIAGNOSIS — R52 Pain, unspecified: Secondary | ICD-10-CM | POA: Diagnosis not present

## 2022-06-22 DIAGNOSIS — N2581 Secondary hyperparathyroidism of renal origin: Secondary | ICD-10-CM | POA: Diagnosis not present

## 2022-06-22 DIAGNOSIS — D631 Anemia in chronic kidney disease: Secondary | ICD-10-CM | POA: Diagnosis not present

## 2022-06-22 DIAGNOSIS — Z992 Dependence on renal dialysis: Secondary | ICD-10-CM | POA: Diagnosis not present

## 2022-06-22 DIAGNOSIS — N186 End stage renal disease: Secondary | ICD-10-CM | POA: Diagnosis not present

## 2022-06-22 DIAGNOSIS — E1110 Type 2 diabetes mellitus with ketoacidosis without coma: Secondary | ICD-10-CM | POA: Diagnosis not present

## 2022-06-23 DIAGNOSIS — I871 Compression of vein: Secondary | ICD-10-CM | POA: Diagnosis not present

## 2022-06-23 DIAGNOSIS — N186 End stage renal disease: Secondary | ICD-10-CM | POA: Diagnosis not present

## 2022-06-23 DIAGNOSIS — T82858A Stenosis of vascular prosthetic devices, implants and grafts, initial encounter: Secondary | ICD-10-CM | POA: Diagnosis not present

## 2022-06-23 DIAGNOSIS — Z992 Dependence on renal dialysis: Secondary | ICD-10-CM | POA: Diagnosis not present

## 2022-06-24 DIAGNOSIS — Z992 Dependence on renal dialysis: Secondary | ICD-10-CM | POA: Diagnosis not present

## 2022-06-24 DIAGNOSIS — D631 Anemia in chronic kidney disease: Secondary | ICD-10-CM | POA: Diagnosis not present

## 2022-06-24 DIAGNOSIS — N2581 Secondary hyperparathyroidism of renal origin: Secondary | ICD-10-CM | POA: Diagnosis not present

## 2022-06-24 DIAGNOSIS — N186 End stage renal disease: Secondary | ICD-10-CM | POA: Diagnosis not present

## 2022-06-24 DIAGNOSIS — R52 Pain, unspecified: Secondary | ICD-10-CM | POA: Diagnosis not present

## 2022-06-24 DIAGNOSIS — E1110 Type 2 diabetes mellitus with ketoacidosis without coma: Secondary | ICD-10-CM | POA: Diagnosis not present

## 2022-06-27 DIAGNOSIS — N2581 Secondary hyperparathyroidism of renal origin: Secondary | ICD-10-CM | POA: Diagnosis not present

## 2022-06-27 DIAGNOSIS — D631 Anemia in chronic kidney disease: Secondary | ICD-10-CM | POA: Diagnosis not present

## 2022-06-27 DIAGNOSIS — E1110 Type 2 diabetes mellitus with ketoacidosis without coma: Secondary | ICD-10-CM | POA: Diagnosis not present

## 2022-06-27 DIAGNOSIS — N186 End stage renal disease: Secondary | ICD-10-CM | POA: Diagnosis not present

## 2022-06-27 DIAGNOSIS — Z992 Dependence on renal dialysis: Secondary | ICD-10-CM | POA: Diagnosis not present

## 2022-06-27 DIAGNOSIS — R52 Pain, unspecified: Secondary | ICD-10-CM | POA: Diagnosis not present

## 2022-07-01 DIAGNOSIS — N2581 Secondary hyperparathyroidism of renal origin: Secondary | ICD-10-CM | POA: Diagnosis not present

## 2022-07-01 DIAGNOSIS — Z992 Dependence on renal dialysis: Secondary | ICD-10-CM | POA: Diagnosis not present

## 2022-07-01 DIAGNOSIS — R52 Pain, unspecified: Secondary | ICD-10-CM | POA: Diagnosis not present

## 2022-07-01 DIAGNOSIS — D631 Anemia in chronic kidney disease: Secondary | ICD-10-CM | POA: Diagnosis not present

## 2022-07-01 DIAGNOSIS — E1110 Type 2 diabetes mellitus with ketoacidosis without coma: Secondary | ICD-10-CM | POA: Diagnosis not present

## 2022-07-01 DIAGNOSIS — N186 End stage renal disease: Secondary | ICD-10-CM | POA: Diagnosis not present

## 2022-07-04 DIAGNOSIS — R52 Pain, unspecified: Secondary | ICD-10-CM | POA: Diagnosis not present

## 2022-07-04 DIAGNOSIS — Z992 Dependence on renal dialysis: Secondary | ICD-10-CM | POA: Diagnosis not present

## 2022-07-04 DIAGNOSIS — N2581 Secondary hyperparathyroidism of renal origin: Secondary | ICD-10-CM | POA: Diagnosis not present

## 2022-07-04 DIAGNOSIS — E1110 Type 2 diabetes mellitus with ketoacidosis without coma: Secondary | ICD-10-CM | POA: Diagnosis not present

## 2022-07-04 DIAGNOSIS — N186 End stage renal disease: Secondary | ICD-10-CM | POA: Diagnosis not present

## 2022-07-04 DIAGNOSIS — D631 Anemia in chronic kidney disease: Secondary | ICD-10-CM | POA: Diagnosis not present

## 2022-07-06 DIAGNOSIS — E1110 Type 2 diabetes mellitus with ketoacidosis without coma: Secondary | ICD-10-CM | POA: Diagnosis not present

## 2022-07-06 DIAGNOSIS — Z992 Dependence on renal dialysis: Secondary | ICD-10-CM | POA: Diagnosis not present

## 2022-07-06 DIAGNOSIS — N186 End stage renal disease: Secondary | ICD-10-CM | POA: Diagnosis not present

## 2022-07-06 DIAGNOSIS — R52 Pain, unspecified: Secondary | ICD-10-CM | POA: Diagnosis not present

## 2022-07-06 DIAGNOSIS — D631 Anemia in chronic kidney disease: Secondary | ICD-10-CM | POA: Diagnosis not present

## 2022-07-06 DIAGNOSIS — N2581 Secondary hyperparathyroidism of renal origin: Secondary | ICD-10-CM | POA: Diagnosis not present

## 2022-07-07 DIAGNOSIS — E1122 Type 2 diabetes mellitus with diabetic chronic kidney disease: Secondary | ICD-10-CM | POA: Diagnosis not present

## 2022-07-07 DIAGNOSIS — Z992 Dependence on renal dialysis: Secondary | ICD-10-CM | POA: Diagnosis not present

## 2022-07-07 DIAGNOSIS — N186 End stage renal disease: Secondary | ICD-10-CM | POA: Diagnosis not present

## 2022-07-08 DIAGNOSIS — N2581 Secondary hyperparathyroidism of renal origin: Secondary | ICD-10-CM | POA: Diagnosis not present

## 2022-07-08 DIAGNOSIS — N186 End stage renal disease: Secondary | ICD-10-CM | POA: Diagnosis not present

## 2022-07-08 DIAGNOSIS — Z992 Dependence on renal dialysis: Secondary | ICD-10-CM | POA: Diagnosis not present

## 2022-07-08 DIAGNOSIS — R52 Pain, unspecified: Secondary | ICD-10-CM | POA: Diagnosis not present

## 2022-07-08 DIAGNOSIS — D631 Anemia in chronic kidney disease: Secondary | ICD-10-CM | POA: Diagnosis not present

## 2022-07-08 DIAGNOSIS — E1110 Type 2 diabetes mellitus with ketoacidosis without coma: Secondary | ICD-10-CM | POA: Diagnosis not present

## 2022-07-11 DIAGNOSIS — N186 End stage renal disease: Secondary | ICD-10-CM | POA: Diagnosis not present

## 2022-07-11 DIAGNOSIS — E1110 Type 2 diabetes mellitus with ketoacidosis without coma: Secondary | ICD-10-CM | POA: Diagnosis not present

## 2022-07-11 DIAGNOSIS — R52 Pain, unspecified: Secondary | ICD-10-CM | POA: Diagnosis not present

## 2022-07-11 DIAGNOSIS — Z992 Dependence on renal dialysis: Secondary | ICD-10-CM | POA: Diagnosis not present

## 2022-07-11 DIAGNOSIS — D631 Anemia in chronic kidney disease: Secondary | ICD-10-CM | POA: Diagnosis not present

## 2022-07-11 DIAGNOSIS — N2581 Secondary hyperparathyroidism of renal origin: Secondary | ICD-10-CM | POA: Diagnosis not present

## 2022-07-13 DIAGNOSIS — N186 End stage renal disease: Secondary | ICD-10-CM | POA: Diagnosis not present

## 2022-07-13 DIAGNOSIS — R52 Pain, unspecified: Secondary | ICD-10-CM | POA: Diagnosis not present

## 2022-07-13 DIAGNOSIS — Z992 Dependence on renal dialysis: Secondary | ICD-10-CM | POA: Diagnosis not present

## 2022-07-13 DIAGNOSIS — E1110 Type 2 diabetes mellitus with ketoacidosis without coma: Secondary | ICD-10-CM | POA: Diagnosis not present

## 2022-07-13 DIAGNOSIS — D631 Anemia in chronic kidney disease: Secondary | ICD-10-CM | POA: Diagnosis not present

## 2022-07-13 DIAGNOSIS — N2581 Secondary hyperparathyroidism of renal origin: Secondary | ICD-10-CM | POA: Diagnosis not present

## 2022-07-15 DIAGNOSIS — Z992 Dependence on renal dialysis: Secondary | ICD-10-CM | POA: Diagnosis not present

## 2022-07-15 DIAGNOSIS — E1110 Type 2 diabetes mellitus with ketoacidosis without coma: Secondary | ICD-10-CM | POA: Diagnosis not present

## 2022-07-15 DIAGNOSIS — R52 Pain, unspecified: Secondary | ICD-10-CM | POA: Diagnosis not present

## 2022-07-15 DIAGNOSIS — D631 Anemia in chronic kidney disease: Secondary | ICD-10-CM | POA: Diagnosis not present

## 2022-07-15 DIAGNOSIS — N186 End stage renal disease: Secondary | ICD-10-CM | POA: Diagnosis not present

## 2022-07-15 DIAGNOSIS — N2581 Secondary hyperparathyroidism of renal origin: Secondary | ICD-10-CM | POA: Diagnosis not present

## 2022-07-18 DIAGNOSIS — N186 End stage renal disease: Secondary | ICD-10-CM | POA: Diagnosis not present

## 2022-07-18 DIAGNOSIS — N2581 Secondary hyperparathyroidism of renal origin: Secondary | ICD-10-CM | POA: Diagnosis not present

## 2022-07-18 DIAGNOSIS — D631 Anemia in chronic kidney disease: Secondary | ICD-10-CM | POA: Diagnosis not present

## 2022-07-18 DIAGNOSIS — E1110 Type 2 diabetes mellitus with ketoacidosis without coma: Secondary | ICD-10-CM | POA: Diagnosis not present

## 2022-07-18 DIAGNOSIS — R52 Pain, unspecified: Secondary | ICD-10-CM | POA: Diagnosis not present

## 2022-07-18 DIAGNOSIS — Z992 Dependence on renal dialysis: Secondary | ICD-10-CM | POA: Diagnosis not present

## 2022-07-20 DIAGNOSIS — E1110 Type 2 diabetes mellitus with ketoacidosis without coma: Secondary | ICD-10-CM | POA: Diagnosis not present

## 2022-07-20 DIAGNOSIS — D631 Anemia in chronic kidney disease: Secondary | ICD-10-CM | POA: Diagnosis not present

## 2022-07-20 DIAGNOSIS — R52 Pain, unspecified: Secondary | ICD-10-CM | POA: Diagnosis not present

## 2022-07-20 DIAGNOSIS — N186 End stage renal disease: Secondary | ICD-10-CM | POA: Diagnosis not present

## 2022-07-20 DIAGNOSIS — N2581 Secondary hyperparathyroidism of renal origin: Secondary | ICD-10-CM | POA: Diagnosis not present

## 2022-07-20 DIAGNOSIS — Z992 Dependence on renal dialysis: Secondary | ICD-10-CM | POA: Diagnosis not present

## 2022-07-22 DIAGNOSIS — D631 Anemia in chronic kidney disease: Secondary | ICD-10-CM | POA: Diagnosis not present

## 2022-07-22 DIAGNOSIS — N2581 Secondary hyperparathyroidism of renal origin: Secondary | ICD-10-CM | POA: Diagnosis not present

## 2022-07-22 DIAGNOSIS — R52 Pain, unspecified: Secondary | ICD-10-CM | POA: Diagnosis not present

## 2022-07-22 DIAGNOSIS — Z992 Dependence on renal dialysis: Secondary | ICD-10-CM | POA: Diagnosis not present

## 2022-07-22 DIAGNOSIS — N186 End stage renal disease: Secondary | ICD-10-CM | POA: Diagnosis not present

## 2022-07-22 DIAGNOSIS — E1110 Type 2 diabetes mellitus with ketoacidosis without coma: Secondary | ICD-10-CM | POA: Diagnosis not present

## 2022-07-25 DIAGNOSIS — E1110 Type 2 diabetes mellitus with ketoacidosis without coma: Secondary | ICD-10-CM | POA: Diagnosis not present

## 2022-07-25 DIAGNOSIS — R52 Pain, unspecified: Secondary | ICD-10-CM | POA: Diagnosis not present

## 2022-07-25 DIAGNOSIS — D631 Anemia in chronic kidney disease: Secondary | ICD-10-CM | POA: Diagnosis not present

## 2022-07-25 DIAGNOSIS — N2581 Secondary hyperparathyroidism of renal origin: Secondary | ICD-10-CM | POA: Diagnosis not present

## 2022-07-25 DIAGNOSIS — N186 End stage renal disease: Secondary | ICD-10-CM | POA: Diagnosis not present

## 2022-07-25 DIAGNOSIS — Z992 Dependence on renal dialysis: Secondary | ICD-10-CM | POA: Diagnosis not present

## 2022-07-27 ENCOUNTER — Ambulatory Visit: Payer: Medicare Other | Admitting: Internal Medicine

## 2022-07-27 DIAGNOSIS — N186 End stage renal disease: Secondary | ICD-10-CM | POA: Diagnosis not present

## 2022-07-27 DIAGNOSIS — D631 Anemia in chronic kidney disease: Secondary | ICD-10-CM | POA: Diagnosis not present

## 2022-07-27 DIAGNOSIS — E1110 Type 2 diabetes mellitus with ketoacidosis without coma: Secondary | ICD-10-CM | POA: Diagnosis not present

## 2022-07-27 DIAGNOSIS — N2581 Secondary hyperparathyroidism of renal origin: Secondary | ICD-10-CM | POA: Diagnosis not present

## 2022-07-27 DIAGNOSIS — Z992 Dependence on renal dialysis: Secondary | ICD-10-CM | POA: Diagnosis not present

## 2022-07-27 DIAGNOSIS — R52 Pain, unspecified: Secondary | ICD-10-CM | POA: Diagnosis not present

## 2022-07-29 DIAGNOSIS — E1110 Type 2 diabetes mellitus with ketoacidosis without coma: Secondary | ICD-10-CM | POA: Diagnosis not present

## 2022-07-29 DIAGNOSIS — N2581 Secondary hyperparathyroidism of renal origin: Secondary | ICD-10-CM | POA: Diagnosis not present

## 2022-07-29 DIAGNOSIS — D631 Anemia in chronic kidney disease: Secondary | ICD-10-CM | POA: Diagnosis not present

## 2022-07-29 DIAGNOSIS — N186 End stage renal disease: Secondary | ICD-10-CM | POA: Diagnosis not present

## 2022-07-29 DIAGNOSIS — R52 Pain, unspecified: Secondary | ICD-10-CM | POA: Diagnosis not present

## 2022-07-29 DIAGNOSIS — Z992 Dependence on renal dialysis: Secondary | ICD-10-CM | POA: Diagnosis not present

## 2022-07-31 DIAGNOSIS — D631 Anemia in chronic kidney disease: Secondary | ICD-10-CM | POA: Diagnosis not present

## 2022-07-31 DIAGNOSIS — N186 End stage renal disease: Secondary | ICD-10-CM | POA: Diagnosis not present

## 2022-07-31 DIAGNOSIS — Z992 Dependence on renal dialysis: Secondary | ICD-10-CM | POA: Diagnosis not present

## 2022-07-31 DIAGNOSIS — R52 Pain, unspecified: Secondary | ICD-10-CM | POA: Diagnosis not present

## 2022-07-31 DIAGNOSIS — N2581 Secondary hyperparathyroidism of renal origin: Secondary | ICD-10-CM | POA: Diagnosis not present

## 2022-07-31 DIAGNOSIS — E1110 Type 2 diabetes mellitus with ketoacidosis without coma: Secondary | ICD-10-CM | POA: Diagnosis not present

## 2022-08-03 DIAGNOSIS — Z992 Dependence on renal dialysis: Secondary | ICD-10-CM | POA: Diagnosis not present

## 2022-08-03 DIAGNOSIS — D631 Anemia in chronic kidney disease: Secondary | ICD-10-CM | POA: Diagnosis not present

## 2022-08-03 DIAGNOSIS — N186 End stage renal disease: Secondary | ICD-10-CM | POA: Diagnosis not present

## 2022-08-03 DIAGNOSIS — E1110 Type 2 diabetes mellitus with ketoacidosis without coma: Secondary | ICD-10-CM | POA: Diagnosis not present

## 2022-08-03 DIAGNOSIS — N2581 Secondary hyperparathyroidism of renal origin: Secondary | ICD-10-CM | POA: Diagnosis not present

## 2022-08-03 DIAGNOSIS — R52 Pain, unspecified: Secondary | ICD-10-CM | POA: Diagnosis not present

## 2022-08-05 DIAGNOSIS — N2581 Secondary hyperparathyroidism of renal origin: Secondary | ICD-10-CM | POA: Diagnosis not present

## 2022-08-05 DIAGNOSIS — R52 Pain, unspecified: Secondary | ICD-10-CM | POA: Diagnosis not present

## 2022-08-05 DIAGNOSIS — Z992 Dependence on renal dialysis: Secondary | ICD-10-CM | POA: Diagnosis not present

## 2022-08-05 DIAGNOSIS — N186 End stage renal disease: Secondary | ICD-10-CM | POA: Diagnosis not present

## 2022-08-05 DIAGNOSIS — D631 Anemia in chronic kidney disease: Secondary | ICD-10-CM | POA: Diagnosis not present

## 2022-08-05 DIAGNOSIS — E1110 Type 2 diabetes mellitus with ketoacidosis without coma: Secondary | ICD-10-CM | POA: Diagnosis not present

## 2022-08-07 DIAGNOSIS — N186 End stage renal disease: Secondary | ICD-10-CM | POA: Diagnosis not present

## 2022-08-07 DIAGNOSIS — R52 Pain, unspecified: Secondary | ICD-10-CM | POA: Diagnosis not present

## 2022-08-07 DIAGNOSIS — N2581 Secondary hyperparathyroidism of renal origin: Secondary | ICD-10-CM | POA: Diagnosis not present

## 2022-08-07 DIAGNOSIS — D631 Anemia in chronic kidney disease: Secondary | ICD-10-CM | POA: Diagnosis not present

## 2022-08-07 DIAGNOSIS — Z992 Dependence on renal dialysis: Secondary | ICD-10-CM | POA: Diagnosis not present

## 2022-08-07 DIAGNOSIS — E1122 Type 2 diabetes mellitus with diabetic chronic kidney disease: Secondary | ICD-10-CM | POA: Diagnosis not present

## 2022-08-07 DIAGNOSIS — E1110 Type 2 diabetes mellitus with ketoacidosis without coma: Secondary | ICD-10-CM | POA: Diagnosis not present

## 2022-08-10 DIAGNOSIS — Z992 Dependence on renal dialysis: Secondary | ICD-10-CM | POA: Diagnosis not present

## 2022-08-10 DIAGNOSIS — R52 Pain, unspecified: Secondary | ICD-10-CM | POA: Diagnosis not present

## 2022-08-10 DIAGNOSIS — D631 Anemia in chronic kidney disease: Secondary | ICD-10-CM | POA: Diagnosis not present

## 2022-08-10 DIAGNOSIS — N186 End stage renal disease: Secondary | ICD-10-CM | POA: Diagnosis not present

## 2022-08-10 DIAGNOSIS — N2581 Secondary hyperparathyroidism of renal origin: Secondary | ICD-10-CM | POA: Diagnosis not present

## 2022-08-10 DIAGNOSIS — E1110 Type 2 diabetes mellitus with ketoacidosis without coma: Secondary | ICD-10-CM | POA: Diagnosis not present

## 2022-08-12 DIAGNOSIS — Z992 Dependence on renal dialysis: Secondary | ICD-10-CM | POA: Diagnosis not present

## 2022-08-12 DIAGNOSIS — N186 End stage renal disease: Secondary | ICD-10-CM | POA: Diagnosis not present

## 2022-08-12 DIAGNOSIS — D631 Anemia in chronic kidney disease: Secondary | ICD-10-CM | POA: Diagnosis not present

## 2022-08-12 DIAGNOSIS — R52 Pain, unspecified: Secondary | ICD-10-CM | POA: Diagnosis not present

## 2022-08-12 DIAGNOSIS — N2581 Secondary hyperparathyroidism of renal origin: Secondary | ICD-10-CM | POA: Diagnosis not present

## 2022-08-12 DIAGNOSIS — E1110 Type 2 diabetes mellitus with ketoacidosis without coma: Secondary | ICD-10-CM | POA: Diagnosis not present

## 2022-08-15 DIAGNOSIS — E1110 Type 2 diabetes mellitus with ketoacidosis without coma: Secondary | ICD-10-CM | POA: Diagnosis not present

## 2022-08-15 DIAGNOSIS — N2581 Secondary hyperparathyroidism of renal origin: Secondary | ICD-10-CM | POA: Diagnosis not present

## 2022-08-15 DIAGNOSIS — D631 Anemia in chronic kidney disease: Secondary | ICD-10-CM | POA: Diagnosis not present

## 2022-08-15 DIAGNOSIS — N186 End stage renal disease: Secondary | ICD-10-CM | POA: Diagnosis not present

## 2022-08-15 DIAGNOSIS — R52 Pain, unspecified: Secondary | ICD-10-CM | POA: Diagnosis not present

## 2022-08-15 DIAGNOSIS — Z992 Dependence on renal dialysis: Secondary | ICD-10-CM | POA: Diagnosis not present

## 2022-08-17 DIAGNOSIS — D631 Anemia in chronic kidney disease: Secondary | ICD-10-CM | POA: Diagnosis not present

## 2022-08-17 DIAGNOSIS — Z992 Dependence on renal dialysis: Secondary | ICD-10-CM | POA: Diagnosis not present

## 2022-08-17 DIAGNOSIS — N186 End stage renal disease: Secondary | ICD-10-CM | POA: Diagnosis not present

## 2022-08-17 DIAGNOSIS — N2581 Secondary hyperparathyroidism of renal origin: Secondary | ICD-10-CM | POA: Diagnosis not present

## 2022-08-17 DIAGNOSIS — R52 Pain, unspecified: Secondary | ICD-10-CM | POA: Diagnosis not present

## 2022-08-17 DIAGNOSIS — E1110 Type 2 diabetes mellitus with ketoacidosis without coma: Secondary | ICD-10-CM | POA: Diagnosis not present

## 2022-08-19 DIAGNOSIS — N186 End stage renal disease: Secondary | ICD-10-CM | POA: Diagnosis not present

## 2022-08-19 DIAGNOSIS — D631 Anemia in chronic kidney disease: Secondary | ICD-10-CM | POA: Diagnosis not present

## 2022-08-19 DIAGNOSIS — E1110 Type 2 diabetes mellitus with ketoacidosis without coma: Secondary | ICD-10-CM | POA: Diagnosis not present

## 2022-08-19 DIAGNOSIS — Z992 Dependence on renal dialysis: Secondary | ICD-10-CM | POA: Diagnosis not present

## 2022-08-19 DIAGNOSIS — N2581 Secondary hyperparathyroidism of renal origin: Secondary | ICD-10-CM | POA: Diagnosis not present

## 2022-08-19 DIAGNOSIS — R52 Pain, unspecified: Secondary | ICD-10-CM | POA: Diagnosis not present

## 2022-08-22 DIAGNOSIS — N2581 Secondary hyperparathyroidism of renal origin: Secondary | ICD-10-CM | POA: Diagnosis not present

## 2022-08-22 DIAGNOSIS — D631 Anemia in chronic kidney disease: Secondary | ICD-10-CM | POA: Diagnosis not present

## 2022-08-22 DIAGNOSIS — E1110 Type 2 diabetes mellitus with ketoacidosis without coma: Secondary | ICD-10-CM | POA: Diagnosis not present

## 2022-08-22 DIAGNOSIS — R52 Pain, unspecified: Secondary | ICD-10-CM | POA: Diagnosis not present

## 2022-08-22 DIAGNOSIS — N186 End stage renal disease: Secondary | ICD-10-CM | POA: Diagnosis not present

## 2022-08-22 DIAGNOSIS — Z992 Dependence on renal dialysis: Secondary | ICD-10-CM | POA: Diagnosis not present

## 2022-08-24 DIAGNOSIS — Z992 Dependence on renal dialysis: Secondary | ICD-10-CM | POA: Diagnosis not present

## 2022-08-24 DIAGNOSIS — N186 End stage renal disease: Secondary | ICD-10-CM | POA: Diagnosis not present

## 2022-08-24 DIAGNOSIS — D631 Anemia in chronic kidney disease: Secondary | ICD-10-CM | POA: Diagnosis not present

## 2022-08-24 DIAGNOSIS — E1110 Type 2 diabetes mellitus with ketoacidosis without coma: Secondary | ICD-10-CM | POA: Diagnosis not present

## 2022-08-24 DIAGNOSIS — N2581 Secondary hyperparathyroidism of renal origin: Secondary | ICD-10-CM | POA: Diagnosis not present

## 2022-08-24 DIAGNOSIS — R52 Pain, unspecified: Secondary | ICD-10-CM | POA: Diagnosis not present

## 2022-08-26 ENCOUNTER — Encounter (HOSPITAL_COMMUNITY): Payer: Self-pay | Admitting: Nephrology

## 2022-08-26 DIAGNOSIS — D631 Anemia in chronic kidney disease: Secondary | ICD-10-CM | POA: Diagnosis not present

## 2022-08-26 DIAGNOSIS — N186 End stage renal disease: Secondary | ICD-10-CM | POA: Diagnosis not present

## 2022-08-26 DIAGNOSIS — Z992 Dependence on renal dialysis: Secondary | ICD-10-CM | POA: Diagnosis not present

## 2022-08-26 DIAGNOSIS — N2581 Secondary hyperparathyroidism of renal origin: Secondary | ICD-10-CM | POA: Diagnosis not present

## 2022-08-26 DIAGNOSIS — R52 Pain, unspecified: Secondary | ICD-10-CM | POA: Diagnosis not present

## 2022-08-26 DIAGNOSIS — E1110 Type 2 diabetes mellitus with ketoacidosis without coma: Secondary | ICD-10-CM | POA: Diagnosis not present

## 2022-08-29 DIAGNOSIS — N2581 Secondary hyperparathyroidism of renal origin: Secondary | ICD-10-CM | POA: Diagnosis not present

## 2022-08-29 DIAGNOSIS — D631 Anemia in chronic kidney disease: Secondary | ICD-10-CM | POA: Diagnosis not present

## 2022-08-29 DIAGNOSIS — Z992 Dependence on renal dialysis: Secondary | ICD-10-CM | POA: Diagnosis not present

## 2022-08-29 DIAGNOSIS — N186 End stage renal disease: Secondary | ICD-10-CM | POA: Diagnosis not present

## 2022-08-29 DIAGNOSIS — E1110 Type 2 diabetes mellitus with ketoacidosis without coma: Secondary | ICD-10-CM | POA: Diagnosis not present

## 2022-08-29 DIAGNOSIS — R52 Pain, unspecified: Secondary | ICD-10-CM | POA: Diagnosis not present

## 2022-09-02 DIAGNOSIS — Z992 Dependence on renal dialysis: Secondary | ICD-10-CM | POA: Diagnosis not present

## 2022-09-02 DIAGNOSIS — N2581 Secondary hyperparathyroidism of renal origin: Secondary | ICD-10-CM | POA: Diagnosis not present

## 2022-09-02 DIAGNOSIS — N186 End stage renal disease: Secondary | ICD-10-CM | POA: Diagnosis not present

## 2022-09-02 DIAGNOSIS — E1110 Type 2 diabetes mellitus with ketoacidosis without coma: Secondary | ICD-10-CM | POA: Diagnosis not present

## 2022-09-02 DIAGNOSIS — R52 Pain, unspecified: Secondary | ICD-10-CM | POA: Diagnosis not present

## 2022-09-02 DIAGNOSIS — D631 Anemia in chronic kidney disease: Secondary | ICD-10-CM | POA: Diagnosis not present

## 2022-09-05 DIAGNOSIS — E1110 Type 2 diabetes mellitus with ketoacidosis without coma: Secondary | ICD-10-CM | POA: Diagnosis not present

## 2022-09-05 DIAGNOSIS — D631 Anemia in chronic kidney disease: Secondary | ICD-10-CM | POA: Diagnosis not present

## 2022-09-05 DIAGNOSIS — N186 End stage renal disease: Secondary | ICD-10-CM | POA: Diagnosis not present

## 2022-09-05 DIAGNOSIS — N2581 Secondary hyperparathyroidism of renal origin: Secondary | ICD-10-CM | POA: Diagnosis not present

## 2022-09-05 DIAGNOSIS — Z992 Dependence on renal dialysis: Secondary | ICD-10-CM | POA: Diagnosis not present

## 2022-09-05 DIAGNOSIS — R52 Pain, unspecified: Secondary | ICD-10-CM | POA: Diagnosis not present

## 2022-09-07 ENCOUNTER — Ambulatory Visit: Payer: Medicare Other | Admitting: Internal Medicine

## 2022-09-07 DIAGNOSIS — Z992 Dependence on renal dialysis: Secondary | ICD-10-CM | POA: Diagnosis not present

## 2022-09-07 DIAGNOSIS — E1122 Type 2 diabetes mellitus with diabetic chronic kidney disease: Secondary | ICD-10-CM | POA: Diagnosis not present

## 2022-09-07 DIAGNOSIS — N186 End stage renal disease: Secondary | ICD-10-CM | POA: Diagnosis not present

## 2022-09-08 ENCOUNTER — Encounter: Payer: Self-pay | Admitting: Internal Medicine

## 2022-09-08 ENCOUNTER — Ambulatory Visit (INDEPENDENT_AMBULATORY_CARE_PROVIDER_SITE_OTHER): Payer: 59 | Admitting: Internal Medicine

## 2022-09-08 VITALS — BP 146/74 | HR 79 | Temp 98.1°F | Ht 59.0 in | Wt 142.4 lb

## 2022-09-08 DIAGNOSIS — I5022 Chronic systolic (congestive) heart failure: Secondary | ICD-10-CM

## 2022-09-08 DIAGNOSIS — N186 End stage renal disease: Secondary | ICD-10-CM

## 2022-09-08 DIAGNOSIS — Z23 Encounter for immunization: Secondary | ICD-10-CM

## 2022-09-08 DIAGNOSIS — N2581 Secondary hyperparathyroidism of renal origin: Secondary | ICD-10-CM

## 2022-09-08 DIAGNOSIS — E2839 Other primary ovarian failure: Secondary | ICD-10-CM

## 2022-09-08 DIAGNOSIS — D472 Monoclonal gammopathy: Secondary | ICD-10-CM

## 2022-09-08 DIAGNOSIS — Z6828 Body mass index (BMI) 28.0-28.9, adult: Secondary | ICD-10-CM

## 2022-09-08 DIAGNOSIS — D6869 Other thrombophilia: Secondary | ICD-10-CM

## 2022-09-08 DIAGNOSIS — I480 Paroxysmal atrial fibrillation: Secondary | ICD-10-CM | POA: Diagnosis not present

## 2022-09-08 DIAGNOSIS — I132 Hypertensive heart and chronic kidney disease with heart failure and with stage 5 chronic kidney disease, or end stage renal disease: Secondary | ICD-10-CM | POA: Diagnosis not present

## 2022-09-08 DIAGNOSIS — R778 Other specified abnormalities of plasma proteins: Secondary | ICD-10-CM

## 2022-09-08 DIAGNOSIS — Z992 Dependence on renal dialysis: Secondary | ICD-10-CM | POA: Diagnosis not present

## 2022-09-08 DIAGNOSIS — R413 Other amnesia: Secondary | ICD-10-CM | POA: Diagnosis not present

## 2022-09-08 DIAGNOSIS — E1122 Type 2 diabetes mellitus with diabetic chronic kidney disease: Secondary | ICD-10-CM | POA: Diagnosis not present

## 2022-09-08 MED ORDER — DONEPEZIL HCL 5 MG PO TABS: ORAL_TABLET | ORAL | 2 refills | Status: DC

## 2022-09-08 NOTE — Progress Notes (Unsigned)
I,Victoria T Hamilton,acting as a scribe for Maximino Greenland, MD.,have documented all relevant documentation on the behalf of Maximino Greenland, MD,as directed by  Maximino Greenland, MD while in the presence of Maximino Greenland, MD.    Subjective:     Patient ID: [name] [name] , female    DOB: [birth year] , 87 y.o.   MRN: [id number]   Chief Complaint  Patient presents with   Diabetes   Hypertension    HPI  Patient is here for DM and HTN follow up. She is accompanied by her daughter, [name] [name] today.  This is a rescheduled appointment from yesterday, patient had dialysis yesterday - MWF.  At this time, she denies headaches, chest pain and shortness of breath. She is compliant with dialysis schedule. Unfortunately, she does not know her meds. She is usually brought by another daughter who knows her meds.   Last time patient was seen here in the office: 03/01/2022.  [facility] rejected for Tdap & shingles.     Diabetes She presents for her follow-up diabetic visit. She has type 2 diabetes mellitus. Her disease course has been stable. There are no hypoglycemic associated symptoms. There are no diabetic associated symptoms. Pertinent negatives for diabetes include no blurred vision and no chest pain. There are no hypoglycemic complications. Symptoms are stable. There are no diabetic complications. Risk factors for coronary artery disease include diabetes mellitus, obesity and sedentary lifestyle. Current diabetic treatment includes oral agent (dual therapy) and oral agent (monotherapy). She is compliant with treatment all of the time. She is currently taking insulin at bedtime. Insulin injections are given by patient. Rotation sites for injection include the abdominal wall. Her weight is decreasing steadily. She is following a generally healthy diet. When asked about meal planning, she reported none. She participates in exercise every other day. She does not see a podiatrist.Eye exam is  current.  Hypertension This is a chronic problem. The current episode started more than 1 year ago. The problem is unchanged. The problem is uncontrolled. Pertinent negatives include no anxiety, blurred vision or chest pain. There are no associated agents to hypertension. Risk factors for coronary artery disease include obesity and diabetes mellitus. There are no compliance problems.      Past Medical History:  Diagnosis Date   Arthritis    RA   CHF (congestive heart failure) (HCC)    CKD (chronic kidney disease) stage 3, GFR 30-59 ml/min (HCC)    M-W-F dialysis   Dementia (HCC)    Diabetes mellitus without complication (HCC)    insulin dependent   Hyperlipidemia    Hypertension    Seasonal allergies    Shortness of breath dyspnea    occasional with exertion - no oxygen   Sleep apnea    does not use cpap   TIA (transient ischemic attack) 2015     Family History  Problem Relation Age of Onset   Diabetes type II Mother    Hypertension Mother    Diabetes type II Father    Hypertension Other    Hyperlipidemia Other    Stroke Other      Current Outpatient Medications:    amLODipine (NORVASC) 10 MG tablet, TAKE 1 TABLET(10 MG) BY MOUTH DAILY, Disp: 90 tablet, Rfl: 2   apixaban (ELIQUIS) 2.5 MG TABS tablet, TAKE 1 TABLET(2.5 MG) BY MOUTH TWICE DAILY, Disp: 180 tablet, Rfl: 1   atorvastatin (LIPITOR) 20 MG tablet, TAKE 1 TABLET(20 MG) BY MOUTH DAILY AT 6 PM FOR  CHOLESTEROL Strength: 20 mg, Disp: 90 tablet, Rfl: 1   blood glucose meter kit and supplies KIT, Dispense based on patient and insurance preference. Use up to four times daily as directed. (FOR ICD-9 250.00, 250.01)., Disp: 1 each, Rfl: 0   Blood Pressure Monitor KIT, Use as directed to check blood pressure as needed, Disp: 1 kit, Rfl: 1   cetirizine (ZYRTEC) 10 MG tablet, Take 10 mg by mouth daily., Disp: , Rfl:    cloNIDine (CATAPRES) 0.1 MG tablet, Take 0.1 mg by mouth daily., Disp: , Rfl:    lidocaine (LMX) 4 % cream,  SMARTSIG:Sparingly Topical As Directed, Disp: , Rfl:    loratadine (CLARITIN) 10 MG tablet, Take 10 mg by mouth daily., Disp: , Rfl:    metoprolol tartrate (LOPRESSOR) 25 MG tablet, TAKE 1/2 TABLET(12.5 MG) BY MOUTH TWICE DAILY, Disp: 90 tablet, Rfl: 2   polyethylene glycol (MIRALAX) 17 g packet, Take 17 g by mouth daily as needed., Disp: 14 each, Rfl: 0   zinc sulfate 220 (50 Zn) MG capsule, Take 1 capsule (220 mg total) by mouth daily., Disp: , Rfl:    ascorbic acid (VITAMIN C) 500 MG tablet, Take 1 tablet (500 mg total) by mouth daily., Disp: , Rfl:    donepezil (ARICEPT) 5 MG tablet, TAKE 1 TABLET(5 MG) BY MOUTH AT BEDTIME, Disp: 90 tablet, Rfl: 2   glucose blood test strip, Use as directed to check blood sugars 3 times per day dx: e11.65, Disp: 100 each, Rfl: 12   lanthanum (FOSRENOL) 1000 MG chewable tablet, Chew 1,000 mg by mouth 3 (three) times daily with meals. Takes twice daily, Disp: , Rfl:    Allergies  Allergen Reactions   Penicillins Swelling and Rash     Review of Systems  Constitutional: Negative.   Eyes:  Negative for blurred vision.  Respiratory: Negative.    Cardiovascular: Negative.  Negative for chest pain.  Neurological: Negative.   Psychiatric/Behavioral: Negative.       Today's Vitals   09/08/22 1415 09/08/22 1508  BP: (!) 160/90 (!) 146/74  Pulse: 79   Temp: 98.1 F (36.7 C)   SpO2: 98%   Weight: 142 lb 6.4 oz (64.6 kg)   Height: '4\' 11"'$  (1.499 m)    Body mass index is 28.76 kg/m.  Wt Readings from Last 3 Encounters:  09/13/22 146 lb 12.8 oz (66.6 kg)  09/08/22 142 lb 6.4 oz (64.6 kg)  05/19/22 137 lb 6.4 oz (62.3 kg)    Objective:  Physical Exam Vitals and nursing note reviewed.  Constitutional:      Appearance: Normal appearance.  HENT:     Head: Normocephalic and atraumatic.     Nose:     Comments: Masked     Mouth/Throat:     Comments: Masked  Eyes:     Extraocular Movements: Extraocular movements intact.  Cardiovascular:     Rate and  Rhythm: Normal rate and regular rhythm.     Heart sounds: Normal heart sounds.     Comments: Fistula LUE, pos thrill Pulmonary:     Effort: Pulmonary effort is normal.     Breath sounds: Normal breath sounds.  Musculoskeletal:     Cervical back: Normal range of motion.  Skin:    General: Skin is warm.  Neurological:     General: No focal deficit present.     Mental Status: She is alert.  Psychiatric:        Mood and Affect: Mood normal.  Behavior: Behavior normal.      Assessment And Plan:     1. Diabetes mellitus with end-stage renal disease ([place]) Comments: Chronic, she is encouraged to bring all meds to each appt. She and daughter agree to [facility] referral for nursing, possibly pharmacy. - Hemoglobin A1c - Lipid panel - TSH - Parathyroid Hormone, Intact w/Ca - Phosphorus  2. 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 Importance of medication/dietary compliance was d/w patient. Daughter agrees to call back with list of meds. - AMB Referral to Chronic Care Management Services  3. Chronic systolic congestive heart failure ([place]) Comments: Chronic, last echo Dec 2021. EF 55-60%.  Importance of salt restriction was d/w patient.  4. Paroxysmal atrial fibrillation (HCC) Comments: Chronic, currently in sinus. She is rate controlled w/ metoprolol and properly anticoagulated w/ Eliquis. - AMB Referral to Chronic Care Management Services  5. Hyperparathyroidism, secondary renal ([place]) - AMB Referral to Chronic Care Management Services  6. Acquired thrombophilia ([place]) Comments: Chronic, she is on Eliquis 2.'5mg'$  bid due to underlying PAF.  7. MGUS (monoclonal gammopathy of unknown significance) Comments: Previous labs reviewed, SPEP pos for M-spike. She has had Hematology evaluation, she is under observation.  8. Memory changes Comments: Importance of medication  compliance was d/w pt. Compliance w/ meds stressed to decrease risk of disease progression. - donepezil (ARICEPT) 5 MG tablet; TAKE 1 TABLET(5 MG) BY MOUTH AT BEDTIME  Dispense: 90 tablet; Refill: 2 - AMB Referral to Chronic Care Management Services  9. Estrogen deficiency Comments: She agrees to bone density, referral placed.  She is encouraged to participate in weight-bearing exercises at least 2-3 days/week. - DG Bone Density; Future  10. Body mass index (BMI) 28.0-28.9, adult Comments: She is encouraged to perform some exercises while watching TV.  11. Dependence on renal dialysis ([place])  12. Need for influenza vaccination - Flu Vaccine QUAD High Dose(Fluad)    Patient was given opportunity to ask questions. Patient verbalized understanding of the plan and was able to repeat key elements of the plan. All questions were answered to their satisfaction.   I, Maximino Greenland, MD, have reviewed all documentation for this visit. The documentation on 09/08/22 for the exam, diagnosis, procedures, and orders are all accurate and complete.   IF YOU HAVE BEEN REFERRED TO A SPECIALIST, IT MAY TAKE 1-2 WEEKS TO SCHEDULE/PROCESS THE REFERRAL. IF YOU HAVE NOT HEARD FROM US/SPECIALIST IN TWO WEEKS, PLEASE GIVE [place] A CALL AT [phone].   THE PATIENT IS ENCOURAGED TO PRACTICE SOCIAL DISTANCING DUE TO THE COVID-19 PANDEMIC.

## 2022-09-08 NOTE — Patient Instructions (Signed)

## 2022-09-09 LAB — LIPID PANEL
Chol/HDL Ratio: 3.1 ratio (ref 0.0–4.4)
Cholesterol, Total: 229 mg/dL — ABNORMAL HIGH (ref 100–199)
HDL: 75 mg/dL (ref >39)
LDL Chol Calc (NIH): 123 mg/dL — ABNORMAL HIGH (ref 0–99)
Triglycerides: 180 mg/dL — ABNORMAL HIGH (ref 0–149)
VLDL Cholesterol Cal: 31 mg/dL (ref 5–40)

## 2022-09-09 LAB — HEMOGLOBIN A1C
Est. average glucose Bld gHb Est-mCnc: 120 mg/dL
Hgb A1c MFr Bld: 5.8 % — ABNORMAL HIGH (ref 4.8–5.6)

## 2022-09-09 LAB — PHOSPHORUS: Phosphorus: 6.9 mg/dL — ABNORMAL HIGH (ref 3.0–4.3)

## 2022-09-09 LAB — PTH, INTACT AND CALCIUM
Calcium: 9 mg/dL (ref 8.7–10.3)
PTH: 305 pg/mL — ABNORMAL HIGH (ref 15–65)

## 2022-09-09 LAB — TSH: TSH: 1.18 u[IU]/mL (ref 0.450–4.500)

## 2022-09-12 ENCOUNTER — Encounter (HOSPITAL_COMMUNITY): Payer: Self-pay | Admitting: Nephrology

## 2022-09-12 ENCOUNTER — Telehealth: Payer: Self-pay

## 2022-09-12 DIAGNOSIS — Z992 Dependence on renal dialysis: Secondary | ICD-10-CM | POA: Diagnosis not present

## 2022-09-12 DIAGNOSIS — R52 Pain, unspecified: Secondary | ICD-10-CM | POA: Diagnosis not present

## 2022-09-12 DIAGNOSIS — N2581 Secondary hyperparathyroidism of renal origin: Secondary | ICD-10-CM | POA: Diagnosis not present

## 2022-09-12 DIAGNOSIS — E1110 Type 2 diabetes mellitus with ketoacidosis without coma: Secondary | ICD-10-CM | POA: Diagnosis not present

## 2022-09-12 DIAGNOSIS — N186 End stage renal disease: Secondary | ICD-10-CM | POA: Diagnosis not present

## 2022-09-12 NOTE — Progress Notes (Signed)
  Chronic Care Management Note  09/12/2022 Name: [name] [name] MRN: [id number] DOB: 1936/01/07  [name] [name] is a 87 y.o. year old female who is a primary care patient of [name], MD and is actively engaged with the Chronic Care Management team. I reached out to [name] by phone today to assist with scheduling an initial visit with the RN Case Manager  Follow up plan: Unsuccessful telephone outreach attempt made. A HIPAA compliant phone message was left for the patient providing contact information and requesting a return call.  The care management team will reach out to the patient again over the next 7 days.  If patient returns call to provider office, please advise to call [name] at [phone].  Norton Blizzard, CMA

## 2022-09-13 ENCOUNTER — Ambulatory Visit (INDEPENDENT_AMBULATORY_CARE_PROVIDER_SITE_OTHER): Payer: 59 | Admitting: Physician Assistant

## 2022-09-13 VITALS — BP 180/79 | HR 80 | Temp 98.3°F | Resp 20 | Ht 59.0 in | Wt 146.8 lb

## 2022-09-13 DIAGNOSIS — Z992 Dependence on renal dialysis: Secondary | ICD-10-CM

## 2022-09-13 DIAGNOSIS — N186 End stage renal disease: Secondary | ICD-10-CM

## 2022-09-13 NOTE — Progress Notes (Signed)
Established Dialysis Access   History of Present Illness   [name] [name] is a 87 y.o. (1935-11-30) female who presents for evaluation for pain at access site.  She has Left upper arm dialysis graft that was placed on 12/10/20 by Dr. Trula Slade. She explains that pain started over past several weeks. She has missed several sessions of dialysis because she did not want to have the pain. She feels it is from inexperienced techs at the dialysis center. Very painful in area of graft. She denies any pain in forearm or hand. No weakness or numbness in hand or forearm. She also reports that arm will hurt at home after dialysis. Her daughter who presented with her today says that yesterday she had one of the lead RN's and she had no pain. Daughter also says she has had 5-6 bleeding episodes over past 2 years. Usually requiring prolonged holding of pressure or dressings at end of dialysis. Does not happen every time. Usually no issues. She dialyzes via Left upper arm graft on MWF  Current Outpatient Medications  Medication Sig Dispense Refill   amLODipine (NORVASC) 10 MG tablet TAKE 1 TABLET(10 MG) BY MOUTH DAILY 90 tablet 2   apixaban (ELIQUIS) 2.5 MG TABS tablet TAKE 1 TABLET(2.5 MG) BY MOUTH TWICE DAILY 180 tablet 1   ascorbic acid (VITAMIN C) 500 MG tablet Take 1 tablet (500 mg total) by mouth daily.     atorvastatin (LIPITOR) 20 MG tablet TAKE 1 TABLET(20 MG) BY MOUTH DAILY AT 6 PM FOR CHOLESTEROL Strength: 20 mg 90 tablet 1   blood glucose meter kit and supplies KIT Dispense based on patient and insurance preference. Use up to four times daily as directed. (FOR ICD-9 250.00, 250.01). 1 each 0   Blood Pressure Monitor KIT Use as directed to check blood pressure as needed 1 kit 1   cetirizine (ZYRTEC) 10 MG tablet Take 10 mg by mouth daily.     cloNIDine (CATAPRES) 0.1 MG tablet Take 0.1 mg by mouth daily.     donepezil (ARICEPT) 5 MG tablet TAKE 1 TABLET(5 MG) BY MOUTH AT BEDTIME 90 tablet 2    glucose blood test strip Use as directed to check blood sugars 3 times per day dx: e11.65 100 each 12   lanthanum (FOSRENOL) 1000 MG chewable tablet Chew 1,000 mg by mouth 3 (three) times daily with meals. Takes twice daily     lidocaine (LMX) 4 % cream SMARTSIG:Sparingly Topical As Directed     loratadine (CLARITIN) 10 MG tablet Take 10 mg by mouth daily.     metoprolol tartrate (LOPRESSOR) 25 MG tablet TAKE 1/2 TABLET(12.5 MG) BY MOUTH TWICE DAILY 90 tablet 2   polyethylene glycol (MIRALAX) 17 g packet Take 17 g by mouth daily as needed. 14 each 0   zinc sulfate 220 (50 Zn) MG capsule Take 1 capsule (220 mg total) by mouth daily.     No current facility-administered medications for this visit.    On ROS today: negative unless stated in HPI   Physical Examination   Vitals:   09/13/22 0905  BP: (!) 180/79  Pulse: 80  Resp: 20  Temp: 98.3 F (36.8 C)  TempSrc: Temporal  SpO2: 100%  Weight: 146 lb 12.8 oz (66.6 kg)  Height: '4\' 11"'$  (1.499 m)   Body mass index is 29.65 kg/m.  General Alert and oriented, in no acute distress  Pulmonary Non labored  Cardiac Regular rate and rhythm  Vascular Vessel Right Left  Radial Palpable Palpable  Brachial Palpable Palpable  Ulnar Palpable Palpable    Musculo- skeletal Left upper arm fistula with good thrill and bruit. A little pulsatile. There is keloid overlying graft. No tenderness to palpation. No bleeding. M/S 5/5 throughout  , Extremities without ischemic changes    Neurologic A&O; CN grossly intact    Medical Decision Making   [name] [name] is a 87 y.o. female who presents with concerns of pain in LUE AV graft at dialysis. Clinically graft appears to be functioning very well. I suspect her discomfort is due to new or inexperienced techs. Hopefully this can be improved going forward. Patients daughter has spoke with lead RN at Dialysis center. Graft is slightly pulsatile but do not think she needs any intervention at this  time. Advised patient and daughter that if there is increased incidence of bleeding that they should notify us so that we can arrange a fistulogram. She can follow up with [place] as needed   Karoline Caldwell, PA-C [facility] Office: [facility] MD: Roxanne Mins

## 2022-09-14 ENCOUNTER — Telehealth: Payer: Self-pay

## 2022-09-14 DIAGNOSIS — R52 Pain, unspecified: Secondary | ICD-10-CM | POA: Diagnosis not present

## 2022-09-14 DIAGNOSIS — N186 End stage renal disease: Secondary | ICD-10-CM | POA: Diagnosis not present

## 2022-09-14 DIAGNOSIS — Z992 Dependence on renal dialysis: Secondary | ICD-10-CM | POA: Diagnosis not present

## 2022-09-14 DIAGNOSIS — E1110 Type 2 diabetes mellitus with ketoacidosis without coma: Secondary | ICD-10-CM | POA: Diagnosis not present

## 2022-09-14 DIAGNOSIS — N2581 Secondary hyperparathyroidism of renal origin: Secondary | ICD-10-CM | POA: Diagnosis not present

## 2022-09-14 NOTE — Progress Notes (Signed)
  Chronic Care Management   Note  09/14/2022 Name: [name] [name] MRN: [id number] DOB: [birth year]  [name] [name] is a 87 y.o. year old female who is a primary care patient of [name], MD. I reached out to [place] by phone today in response to a referral sent by [name] [name]'s PCP.  [name] [name] was given information about Chronic Care Management services today including:  CCM service includes personalized support from designated clinical staff supervised by the physician, including individualized plan of care and coordination with other care providers 24/7 contact phone numbers for assistance for urgent and routine care needs. Service will only be billed when office clinical staff spend 20 minutes or more in a month to coordinate care. Only one practitioner may furnish and bill the service in a calendar month. The patient may stop CCM services at amy time (effective at the end of the month) by phone call to the office staff. The patient will be responsible for cost sharing (co-pay) or up to 20% of the service fee (after annual deductible is met)  [name] [name]  agreedto scheduling an appointment with the CCM RN Case Manager   Follow up plan: Patient agreed to scheduled appointment with RN Case Manager on 09/15/2022(date/time).   [name] [name], [place], [place] [address] Direct Dial: [phone] [email or web address]

## 2022-09-15 ENCOUNTER — Telehealth: Payer: 59

## 2022-09-15 ENCOUNTER — Telehealth: Payer: Self-pay | Admitting: *Deleted

## 2022-09-15 NOTE — Telephone Encounter (Signed)
   CCM RN Visit Note   09/15/22 Name: [name] MRN: [id number]      DOB: 1935-09-19  Subjective: [name] [name] is a 87 y.o. year old female who is a primary care patient of [name] MD. The patient was referred to the Chronic Care Management team for assistance with care management needs subsequent to provider initiation of CCM services and plan of care.      An unsuccessful telephone outreach was attempted today to contact the patient about Chronic Care Management needs.    Plan:Telephone follow up appointment with care management team member scheduled for:  upon care guide rescheduling.  Jacqlyn Larsen [facility], BSN RN Case Manager [facility] [phone]

## 2022-09-16 DIAGNOSIS — R52 Pain, unspecified: Secondary | ICD-10-CM | POA: Diagnosis not present

## 2022-09-16 DIAGNOSIS — N2581 Secondary hyperparathyroidism of renal origin: Secondary | ICD-10-CM | POA: Diagnosis not present

## 2022-09-16 DIAGNOSIS — E1110 Type 2 diabetes mellitus with ketoacidosis without coma: Secondary | ICD-10-CM | POA: Diagnosis not present

## 2022-09-16 DIAGNOSIS — Z992 Dependence on renal dialysis: Secondary | ICD-10-CM | POA: Diagnosis not present

## 2022-09-16 DIAGNOSIS — N186 End stage renal disease: Secondary | ICD-10-CM | POA: Diagnosis not present

## 2022-09-19 DIAGNOSIS — E1110 Type 2 diabetes mellitus with ketoacidosis without coma: Secondary | ICD-10-CM | POA: Diagnosis not present

## 2022-09-19 DIAGNOSIS — Z992 Dependence on renal dialysis: Secondary | ICD-10-CM | POA: Diagnosis not present

## 2022-09-19 DIAGNOSIS — R059 Cough, unspecified: Secondary | ICD-10-CM | POA: Diagnosis not present

## 2022-09-19 DIAGNOSIS — N2581 Secondary hyperparathyroidism of renal origin: Secondary | ICD-10-CM | POA: Diagnosis not present

## 2022-09-19 DIAGNOSIS — R52 Pain, unspecified: Secondary | ICD-10-CM | POA: Diagnosis not present

## 2022-09-19 DIAGNOSIS — N186 End stage renal disease: Secondary | ICD-10-CM | POA: Diagnosis not present

## 2022-09-21 DIAGNOSIS — E1110 Type 2 diabetes mellitus with ketoacidosis without coma: Secondary | ICD-10-CM | POA: Diagnosis not present

## 2022-09-21 DIAGNOSIS — N186 End stage renal disease: Secondary | ICD-10-CM | POA: Diagnosis not present

## 2022-09-21 DIAGNOSIS — Z992 Dependence on renal dialysis: Secondary | ICD-10-CM | POA: Diagnosis not present

## 2022-09-21 DIAGNOSIS — N2581 Secondary hyperparathyroidism of renal origin: Secondary | ICD-10-CM | POA: Diagnosis not present

## 2022-09-21 DIAGNOSIS — R52 Pain, unspecified: Secondary | ICD-10-CM | POA: Diagnosis not present

## 2022-09-22 ENCOUNTER — Telehealth: Payer: Self-pay

## 2022-09-22 NOTE — Progress Notes (Signed)
  Chronic Care Management Note  09/22/2022 Name: [name] [name] MRN: [id number] DOB: 1936/01/15  [name] is a 87 y.o. year old female who is a primary care patient of [name], MD and is actively engaged with the Chronic Care Management team. I reached out to [place] by phone today to assist with re-scheduling an initial visit with the RN Case Manager  Follow up plan: Unsuccessful telephone outreach attempt made. A HIPAA compliant phone message was left for the patient providing contact information and requesting a return call.  The care management team will reach out to the patient again over the next 7 days.  If patient returns call to provider office, please advise to call [name]  at [name], [place], [place] [address] Direct Dial: [phone] [name]

## 2022-09-23 DIAGNOSIS — E1110 Type 2 diabetes mellitus with ketoacidosis without coma: Secondary | ICD-10-CM | POA: Diagnosis not present

## 2022-09-23 DIAGNOSIS — N2581 Secondary hyperparathyroidism of renal origin: Secondary | ICD-10-CM | POA: Diagnosis not present

## 2022-09-23 DIAGNOSIS — R52 Pain, unspecified: Secondary | ICD-10-CM | POA: Diagnosis not present

## 2022-09-23 DIAGNOSIS — N186 End stage renal disease: Secondary | ICD-10-CM | POA: Diagnosis not present

## 2022-09-23 DIAGNOSIS — Z992 Dependence on renal dialysis: Secondary | ICD-10-CM | POA: Diagnosis not present

## 2022-09-26 DIAGNOSIS — N186 End stage renal disease: Secondary | ICD-10-CM | POA: Diagnosis not present

## 2022-09-26 DIAGNOSIS — Z992 Dependence on renal dialysis: Secondary | ICD-10-CM | POA: Diagnosis not present

## 2022-09-26 DIAGNOSIS — E1110 Type 2 diabetes mellitus with ketoacidosis without coma: Secondary | ICD-10-CM | POA: Diagnosis not present

## 2022-09-26 DIAGNOSIS — N2581 Secondary hyperparathyroidism of renal origin: Secondary | ICD-10-CM | POA: Diagnosis not present

## 2022-09-26 DIAGNOSIS — R52 Pain, unspecified: Secondary | ICD-10-CM | POA: Diagnosis not present

## 2022-09-28 DIAGNOSIS — N2581 Secondary hyperparathyroidism of renal origin: Secondary | ICD-10-CM | POA: Diagnosis not present

## 2022-09-28 DIAGNOSIS — E1110 Type 2 diabetes mellitus with ketoacidosis without coma: Secondary | ICD-10-CM | POA: Diagnosis not present

## 2022-09-28 DIAGNOSIS — Z992 Dependence on renal dialysis: Secondary | ICD-10-CM | POA: Diagnosis not present

## 2022-09-28 DIAGNOSIS — N186 End stage renal disease: Secondary | ICD-10-CM | POA: Diagnosis not present

## 2022-09-28 DIAGNOSIS — R52 Pain, unspecified: Secondary | ICD-10-CM | POA: Diagnosis not present

## 2022-09-30 DIAGNOSIS — N2581 Secondary hyperparathyroidism of renal origin: Secondary | ICD-10-CM | POA: Diagnosis not present

## 2022-09-30 DIAGNOSIS — Z992 Dependence on renal dialysis: Secondary | ICD-10-CM | POA: Diagnosis not present

## 2022-09-30 DIAGNOSIS — N186 End stage renal disease: Secondary | ICD-10-CM | POA: Diagnosis not present

## 2022-09-30 DIAGNOSIS — R52 Pain, unspecified: Secondary | ICD-10-CM | POA: Diagnosis not present

## 2022-09-30 DIAGNOSIS — E1110 Type 2 diabetes mellitus with ketoacidosis without coma: Secondary | ICD-10-CM | POA: Diagnosis not present

## 2022-10-03 DIAGNOSIS — N186 End stage renal disease: Secondary | ICD-10-CM | POA: Diagnosis not present

## 2022-10-03 DIAGNOSIS — R52 Pain, unspecified: Secondary | ICD-10-CM | POA: Diagnosis not present

## 2022-10-03 DIAGNOSIS — E1110 Type 2 diabetes mellitus with ketoacidosis without coma: Secondary | ICD-10-CM | POA: Diagnosis not present

## 2022-10-03 DIAGNOSIS — N2581 Secondary hyperparathyroidism of renal origin: Secondary | ICD-10-CM | POA: Diagnosis not present

## 2022-10-03 DIAGNOSIS — Z992 Dependence on renal dialysis: Secondary | ICD-10-CM | POA: Diagnosis not present

## 2022-10-05 DIAGNOSIS — R52 Pain, unspecified: Secondary | ICD-10-CM | POA: Diagnosis not present

## 2022-10-05 DIAGNOSIS — N2581 Secondary hyperparathyroidism of renal origin: Secondary | ICD-10-CM | POA: Diagnosis not present

## 2022-10-05 DIAGNOSIS — E1110 Type 2 diabetes mellitus with ketoacidosis without coma: Secondary | ICD-10-CM | POA: Diagnosis not present

## 2022-10-05 DIAGNOSIS — Z992 Dependence on renal dialysis: Secondary | ICD-10-CM | POA: Diagnosis not present

## 2022-10-05 DIAGNOSIS — N186 End stage renal disease: Secondary | ICD-10-CM | POA: Diagnosis not present

## 2022-10-06 DIAGNOSIS — N186 End stage renal disease: Secondary | ICD-10-CM | POA: Diagnosis not present

## 2022-10-06 DIAGNOSIS — E1122 Type 2 diabetes mellitus with diabetic chronic kidney disease: Secondary | ICD-10-CM | POA: Diagnosis not present

## 2022-10-06 DIAGNOSIS — Z992 Dependence on renal dialysis: Secondary | ICD-10-CM | POA: Diagnosis not present

## 2022-10-07 DIAGNOSIS — E1110 Type 2 diabetes mellitus with ketoacidosis without coma: Secondary | ICD-10-CM | POA: Diagnosis not present

## 2022-10-07 DIAGNOSIS — Z992 Dependence on renal dialysis: Secondary | ICD-10-CM | POA: Diagnosis not present

## 2022-10-07 DIAGNOSIS — N2581 Secondary hyperparathyroidism of renal origin: Secondary | ICD-10-CM | POA: Diagnosis not present

## 2022-10-07 DIAGNOSIS — R52 Pain, unspecified: Secondary | ICD-10-CM | POA: Diagnosis not present

## 2022-10-07 DIAGNOSIS — N186 End stage renal disease: Secondary | ICD-10-CM | POA: Diagnosis not present

## 2022-10-10 DIAGNOSIS — N2581 Secondary hyperparathyroidism of renal origin: Secondary | ICD-10-CM | POA: Diagnosis not present

## 2022-10-10 DIAGNOSIS — N186 End stage renal disease: Secondary | ICD-10-CM | POA: Diagnosis not present

## 2022-10-10 DIAGNOSIS — Z992 Dependence on renal dialysis: Secondary | ICD-10-CM | POA: Diagnosis not present

## 2022-10-10 DIAGNOSIS — R52 Pain, unspecified: Secondary | ICD-10-CM | POA: Diagnosis not present

## 2022-10-10 DIAGNOSIS — E1110 Type 2 diabetes mellitus with ketoacidosis without coma: Secondary | ICD-10-CM | POA: Diagnosis not present

## 2022-10-10 NOTE — Progress Notes (Signed)
  Chronic Care Management Note  10/10/2022 Name: [name] [name] MRN: [id number] DOB: 07/16/1936  [name] [name] is a 87 y.o. year old female who is a primary care patient of [name], MD and is actively engaged with the Chronic Care Management team. I reached out to [place] by phone today to assist with re-scheduling an initial visit with the RN Case Manager  Follow up plan: Unsuccessful telephone outreach attempt made. A HIPAA compliant phone message was left for the patient providing contact information and requesting a return call.  The care management team will reach out to the patient again over the next 7 days.  If patient returns call to provider office, please advise to call [place]  at [place], [place], [place] [address] Direct Dial: [phone] [email or web address]

## 2022-10-12 DIAGNOSIS — Z992 Dependence on renal dialysis: Secondary | ICD-10-CM | POA: Diagnosis not present

## 2022-10-12 DIAGNOSIS — N2581 Secondary hyperparathyroidism of renal origin: Secondary | ICD-10-CM | POA: Diagnosis not present

## 2022-10-12 DIAGNOSIS — E1110 Type 2 diabetes mellitus with ketoacidosis without coma: Secondary | ICD-10-CM | POA: Diagnosis not present

## 2022-10-12 DIAGNOSIS — N186 End stage renal disease: Secondary | ICD-10-CM | POA: Diagnosis not present

## 2022-10-12 DIAGNOSIS — R52 Pain, unspecified: Secondary | ICD-10-CM | POA: Diagnosis not present

## 2022-10-14 DIAGNOSIS — R52 Pain, unspecified: Secondary | ICD-10-CM | POA: Diagnosis not present

## 2022-10-14 DIAGNOSIS — N186 End stage renal disease: Secondary | ICD-10-CM | POA: Diagnosis not present

## 2022-10-14 DIAGNOSIS — N2581 Secondary hyperparathyroidism of renal origin: Secondary | ICD-10-CM | POA: Diagnosis not present

## 2022-10-14 DIAGNOSIS — Z992 Dependence on renal dialysis: Secondary | ICD-10-CM | POA: Diagnosis not present

## 2022-10-14 DIAGNOSIS — E1110 Type 2 diabetes mellitus with ketoacidosis without coma: Secondary | ICD-10-CM | POA: Diagnosis not present

## 2022-10-17 DIAGNOSIS — N186 End stage renal disease: Secondary | ICD-10-CM | POA: Diagnosis not present

## 2022-10-17 DIAGNOSIS — R52 Pain, unspecified: Secondary | ICD-10-CM | POA: Diagnosis not present

## 2022-10-17 DIAGNOSIS — Z992 Dependence on renal dialysis: Secondary | ICD-10-CM | POA: Diagnosis not present

## 2022-10-17 DIAGNOSIS — E1110 Type 2 diabetes mellitus with ketoacidosis without coma: Secondary | ICD-10-CM | POA: Diagnosis not present

## 2022-10-17 DIAGNOSIS — N2581 Secondary hyperparathyroidism of renal origin: Secondary | ICD-10-CM | POA: Diagnosis not present

## 2022-10-19 DIAGNOSIS — N2581 Secondary hyperparathyroidism of renal origin: Secondary | ICD-10-CM | POA: Diagnosis not present

## 2022-10-19 DIAGNOSIS — N186 End stage renal disease: Secondary | ICD-10-CM | POA: Diagnosis not present

## 2022-10-19 DIAGNOSIS — E1110 Type 2 diabetes mellitus with ketoacidosis without coma: Secondary | ICD-10-CM | POA: Diagnosis not present

## 2022-10-19 DIAGNOSIS — Z992 Dependence on renal dialysis: Secondary | ICD-10-CM | POA: Diagnosis not present

## 2022-10-19 DIAGNOSIS — R52 Pain, unspecified: Secondary | ICD-10-CM | POA: Diagnosis not present

## 2022-10-19 NOTE — Progress Notes (Signed)
  Chronic Care Management Note  10/19/2022 Name: [name] [name] MRN: [id number] DOB: [birth year]  [name] is a 87 y.o. year old female who is a primary care patient of [name], MD and is actively engaged with the Chronic Care Management team. I reached out to [place] by phone today to assist with re-scheduling an initial visit with the RN Case Manager  Follow up plan: Unable to make contact on outreach attempts x 3. PCP [name], MD notified via routed documentation in medical record.   [name], [name], [name] [address] Direct Dial: [phone] [email or web address]

## 2022-10-21 DIAGNOSIS — E1110 Type 2 diabetes mellitus with ketoacidosis without coma: Secondary | ICD-10-CM | POA: Diagnosis not present

## 2022-10-21 DIAGNOSIS — N186 End stage renal disease: Secondary | ICD-10-CM | POA: Diagnosis not present

## 2022-10-21 DIAGNOSIS — N2581 Secondary hyperparathyroidism of renal origin: Secondary | ICD-10-CM | POA: Diagnosis not present

## 2022-10-21 DIAGNOSIS — R52 Pain, unspecified: Secondary | ICD-10-CM | POA: Diagnosis not present

## 2022-10-21 DIAGNOSIS — Z992 Dependence on renal dialysis: Secondary | ICD-10-CM | POA: Diagnosis not present

## 2022-10-24 DIAGNOSIS — N2581 Secondary hyperparathyroidism of renal origin: Secondary | ICD-10-CM | POA: Diagnosis not present

## 2022-10-24 DIAGNOSIS — N186 End stage renal disease: Secondary | ICD-10-CM | POA: Diagnosis not present

## 2022-10-24 DIAGNOSIS — Z992 Dependence on renal dialysis: Secondary | ICD-10-CM | POA: Diagnosis not present

## 2022-10-24 DIAGNOSIS — R52 Pain, unspecified: Secondary | ICD-10-CM | POA: Diagnosis not present

## 2022-10-24 DIAGNOSIS — E1110 Type 2 diabetes mellitus with ketoacidosis without coma: Secondary | ICD-10-CM | POA: Diagnosis not present

## 2022-10-26 DIAGNOSIS — N186 End stage renal disease: Secondary | ICD-10-CM | POA: Diagnosis not present

## 2022-10-26 DIAGNOSIS — Z992 Dependence on renal dialysis: Secondary | ICD-10-CM | POA: Diagnosis not present

## 2022-10-26 DIAGNOSIS — E1110 Type 2 diabetes mellitus with ketoacidosis without coma: Secondary | ICD-10-CM | POA: Diagnosis not present

## 2022-10-26 DIAGNOSIS — N2581 Secondary hyperparathyroidism of renal origin: Secondary | ICD-10-CM | POA: Diagnosis not present

## 2022-10-26 DIAGNOSIS — R52 Pain, unspecified: Secondary | ICD-10-CM | POA: Diagnosis not present

## 2022-10-28 DIAGNOSIS — E1110 Type 2 diabetes mellitus with ketoacidosis without coma: Secondary | ICD-10-CM | POA: Diagnosis not present

## 2022-10-28 DIAGNOSIS — R52 Pain, unspecified: Secondary | ICD-10-CM | POA: Diagnosis not present

## 2022-10-28 DIAGNOSIS — N186 End stage renal disease: Secondary | ICD-10-CM | POA: Diagnosis not present

## 2022-10-28 DIAGNOSIS — N2581 Secondary hyperparathyroidism of renal origin: Secondary | ICD-10-CM | POA: Diagnosis not present

## 2022-10-28 DIAGNOSIS — Z992 Dependence on renal dialysis: Secondary | ICD-10-CM | POA: Diagnosis not present

## 2022-10-31 DIAGNOSIS — R52 Pain, unspecified: Secondary | ICD-10-CM | POA: Diagnosis not present

## 2022-10-31 DIAGNOSIS — E1110 Type 2 diabetes mellitus with ketoacidosis without coma: Secondary | ICD-10-CM | POA: Diagnosis not present

## 2022-10-31 DIAGNOSIS — Z992 Dependence on renal dialysis: Secondary | ICD-10-CM | POA: Diagnosis not present

## 2022-10-31 DIAGNOSIS — N186 End stage renal disease: Secondary | ICD-10-CM | POA: Diagnosis not present

## 2022-10-31 DIAGNOSIS — N2581 Secondary hyperparathyroidism of renal origin: Secondary | ICD-10-CM | POA: Diagnosis not present

## 2022-11-02 DIAGNOSIS — R52 Pain, unspecified: Secondary | ICD-10-CM | POA: Diagnosis not present

## 2022-11-02 DIAGNOSIS — N186 End stage renal disease: Secondary | ICD-10-CM | POA: Diagnosis not present

## 2022-11-02 DIAGNOSIS — E1110 Type 2 diabetes mellitus with ketoacidosis without coma: Secondary | ICD-10-CM | POA: Diagnosis not present

## 2022-11-02 DIAGNOSIS — N2581 Secondary hyperparathyroidism of renal origin: Secondary | ICD-10-CM | POA: Diagnosis not present

## 2022-11-02 DIAGNOSIS — Z992 Dependence on renal dialysis: Secondary | ICD-10-CM | POA: Diagnosis not present

## 2022-11-04 DIAGNOSIS — N186 End stage renal disease: Secondary | ICD-10-CM | POA: Diagnosis not present

## 2022-11-04 DIAGNOSIS — E1110 Type 2 diabetes mellitus with ketoacidosis without coma: Secondary | ICD-10-CM | POA: Diagnosis not present

## 2022-11-04 DIAGNOSIS — Z992 Dependence on renal dialysis: Secondary | ICD-10-CM | POA: Diagnosis not present

## 2022-11-04 DIAGNOSIS — R52 Pain, unspecified: Secondary | ICD-10-CM | POA: Diagnosis not present

## 2022-11-04 DIAGNOSIS — N2581 Secondary hyperparathyroidism of renal origin: Secondary | ICD-10-CM | POA: Diagnosis not present

## 2022-11-06 DIAGNOSIS — Z992 Dependence on renal dialysis: Secondary | ICD-10-CM | POA: Diagnosis not present

## 2022-11-06 DIAGNOSIS — E1122 Type 2 diabetes mellitus with diabetic chronic kidney disease: Secondary | ICD-10-CM | POA: Diagnosis not present

## 2022-11-06 DIAGNOSIS — N186 End stage renal disease: Secondary | ICD-10-CM | POA: Diagnosis not present

## 2022-11-07 DIAGNOSIS — Z992 Dependence on renal dialysis: Secondary | ICD-10-CM | POA: Diagnosis not present

## 2022-11-07 DIAGNOSIS — N186 End stage renal disease: Secondary | ICD-10-CM | POA: Diagnosis not present

## 2022-11-07 DIAGNOSIS — E1110 Type 2 diabetes mellitus with ketoacidosis without coma: Secondary | ICD-10-CM | POA: Diagnosis not present

## 2022-11-07 DIAGNOSIS — R52 Pain, unspecified: Secondary | ICD-10-CM | POA: Diagnosis not present

## 2022-11-07 DIAGNOSIS — N2581 Secondary hyperparathyroidism of renal origin: Secondary | ICD-10-CM | POA: Diagnosis not present

## 2022-11-09 DIAGNOSIS — R52 Pain, unspecified: Secondary | ICD-10-CM | POA: Diagnosis not present

## 2022-11-09 DIAGNOSIS — Z992 Dependence on renal dialysis: Secondary | ICD-10-CM | POA: Diagnosis not present

## 2022-11-09 DIAGNOSIS — N186 End stage renal disease: Secondary | ICD-10-CM | POA: Diagnosis not present

## 2022-11-09 DIAGNOSIS — E1110 Type 2 diabetes mellitus with ketoacidosis without coma: Secondary | ICD-10-CM | POA: Diagnosis not present

## 2022-11-09 DIAGNOSIS — N2581 Secondary hyperparathyroidism of renal origin: Secondary | ICD-10-CM | POA: Diagnosis not present

## 2022-11-11 DIAGNOSIS — N2581 Secondary hyperparathyroidism of renal origin: Secondary | ICD-10-CM | POA: Diagnosis not present

## 2022-11-11 DIAGNOSIS — Z992 Dependence on renal dialysis: Secondary | ICD-10-CM | POA: Diagnosis not present

## 2022-11-11 DIAGNOSIS — N186 End stage renal disease: Secondary | ICD-10-CM | POA: Diagnosis not present

## 2022-11-11 DIAGNOSIS — E1110 Type 2 diabetes mellitus with ketoacidosis without coma: Secondary | ICD-10-CM | POA: Diagnosis not present

## 2022-11-11 DIAGNOSIS — R52 Pain, unspecified: Secondary | ICD-10-CM | POA: Diagnosis not present

## 2022-11-14 DIAGNOSIS — E1110 Type 2 diabetes mellitus with ketoacidosis without coma: Secondary | ICD-10-CM | POA: Diagnosis not present

## 2022-11-14 DIAGNOSIS — N186 End stage renal disease: Secondary | ICD-10-CM | POA: Diagnosis not present

## 2022-11-14 DIAGNOSIS — N2581 Secondary hyperparathyroidism of renal origin: Secondary | ICD-10-CM | POA: Diagnosis not present

## 2022-11-14 DIAGNOSIS — Z992 Dependence on renal dialysis: Secondary | ICD-10-CM | POA: Diagnosis not present

## 2022-11-14 DIAGNOSIS — R52 Pain, unspecified: Secondary | ICD-10-CM | POA: Diagnosis not present

## 2022-11-16 DIAGNOSIS — E1110 Type 2 diabetes mellitus with ketoacidosis without coma: Secondary | ICD-10-CM | POA: Diagnosis not present

## 2022-11-16 DIAGNOSIS — Z992 Dependence on renal dialysis: Secondary | ICD-10-CM | POA: Diagnosis not present

## 2022-11-16 DIAGNOSIS — N186 End stage renal disease: Secondary | ICD-10-CM | POA: Diagnosis not present

## 2022-11-16 DIAGNOSIS — R52 Pain, unspecified: Secondary | ICD-10-CM | POA: Diagnosis not present

## 2022-11-16 DIAGNOSIS — N2581 Secondary hyperparathyroidism of renal origin: Secondary | ICD-10-CM | POA: Diagnosis not present

## 2022-11-18 DIAGNOSIS — N2581 Secondary hyperparathyroidism of renal origin: Secondary | ICD-10-CM | POA: Diagnosis not present

## 2022-11-18 DIAGNOSIS — Z992 Dependence on renal dialysis: Secondary | ICD-10-CM | POA: Diagnosis not present

## 2022-11-18 DIAGNOSIS — E1110 Type 2 diabetes mellitus with ketoacidosis without coma: Secondary | ICD-10-CM | POA: Diagnosis not present

## 2022-11-18 DIAGNOSIS — R52 Pain, unspecified: Secondary | ICD-10-CM | POA: Diagnosis not present

## 2022-11-18 DIAGNOSIS — N186 End stage renal disease: Secondary | ICD-10-CM | POA: Diagnosis not present

## 2022-11-21 DIAGNOSIS — E1110 Type 2 diabetes mellitus with ketoacidosis without coma: Secondary | ICD-10-CM | POA: Diagnosis not present

## 2022-11-21 DIAGNOSIS — N186 End stage renal disease: Secondary | ICD-10-CM | POA: Diagnosis not present

## 2022-11-21 DIAGNOSIS — R52 Pain, unspecified: Secondary | ICD-10-CM | POA: Diagnosis not present

## 2022-11-21 DIAGNOSIS — Z992 Dependence on renal dialysis: Secondary | ICD-10-CM | POA: Diagnosis not present

## 2022-11-21 DIAGNOSIS — N2581 Secondary hyperparathyroidism of renal origin: Secondary | ICD-10-CM | POA: Diagnosis not present

## 2022-11-23 DIAGNOSIS — E1110 Type 2 diabetes mellitus with ketoacidosis without coma: Secondary | ICD-10-CM | POA: Diagnosis not present

## 2022-11-23 DIAGNOSIS — Z992 Dependence on renal dialysis: Secondary | ICD-10-CM | POA: Diagnosis not present

## 2022-11-23 DIAGNOSIS — N2581 Secondary hyperparathyroidism of renal origin: Secondary | ICD-10-CM | POA: Diagnosis not present

## 2022-11-23 DIAGNOSIS — R52 Pain, unspecified: Secondary | ICD-10-CM | POA: Diagnosis not present

## 2022-11-23 DIAGNOSIS — N186 End stage renal disease: Secondary | ICD-10-CM | POA: Diagnosis not present

## 2022-11-25 DIAGNOSIS — R52 Pain, unspecified: Secondary | ICD-10-CM | POA: Diagnosis not present

## 2022-11-25 DIAGNOSIS — Z992 Dependence on renal dialysis: Secondary | ICD-10-CM | POA: Diagnosis not present

## 2022-11-25 DIAGNOSIS — N186 End stage renal disease: Secondary | ICD-10-CM | POA: Diagnosis not present

## 2022-11-25 DIAGNOSIS — E1110 Type 2 diabetes mellitus with ketoacidosis without coma: Secondary | ICD-10-CM | POA: Diagnosis not present

## 2022-11-25 DIAGNOSIS — N2581 Secondary hyperparathyroidism of renal origin: Secondary | ICD-10-CM | POA: Diagnosis not present

## 2022-11-28 DIAGNOSIS — R52 Pain, unspecified: Secondary | ICD-10-CM | POA: Diagnosis not present

## 2022-11-28 DIAGNOSIS — Z992 Dependence on renal dialysis: Secondary | ICD-10-CM | POA: Diagnosis not present

## 2022-11-28 DIAGNOSIS — N186 End stage renal disease: Secondary | ICD-10-CM | POA: Diagnosis not present

## 2022-11-28 DIAGNOSIS — N2581 Secondary hyperparathyroidism of renal origin: Secondary | ICD-10-CM | POA: Diagnosis not present

## 2022-11-28 DIAGNOSIS — E1110 Type 2 diabetes mellitus with ketoacidosis without coma: Secondary | ICD-10-CM | POA: Diagnosis not present

## 2022-11-30 DIAGNOSIS — N186 End stage renal disease: Secondary | ICD-10-CM | POA: Diagnosis not present

## 2022-11-30 DIAGNOSIS — R52 Pain, unspecified: Secondary | ICD-10-CM | POA: Diagnosis not present

## 2022-11-30 DIAGNOSIS — Z992 Dependence on renal dialysis: Secondary | ICD-10-CM | POA: Diagnosis not present

## 2022-11-30 DIAGNOSIS — E1110 Type 2 diabetes mellitus with ketoacidosis without coma: Secondary | ICD-10-CM | POA: Diagnosis not present

## 2022-11-30 DIAGNOSIS — N2581 Secondary hyperparathyroidism of renal origin: Secondary | ICD-10-CM | POA: Diagnosis not present

## 2022-12-02 DIAGNOSIS — Z992 Dependence on renal dialysis: Secondary | ICD-10-CM | POA: Diagnosis not present

## 2022-12-02 DIAGNOSIS — N2581 Secondary hyperparathyroidism of renal origin: Secondary | ICD-10-CM | POA: Diagnosis not present

## 2022-12-02 DIAGNOSIS — E1110 Type 2 diabetes mellitus with ketoacidosis without coma: Secondary | ICD-10-CM | POA: Diagnosis not present

## 2022-12-02 DIAGNOSIS — R52 Pain, unspecified: Secondary | ICD-10-CM | POA: Diagnosis not present

## 2022-12-02 DIAGNOSIS — N186 End stage renal disease: Secondary | ICD-10-CM | POA: Diagnosis not present

## 2022-12-05 DIAGNOSIS — R52 Pain, unspecified: Secondary | ICD-10-CM | POA: Diagnosis not present

## 2022-12-05 DIAGNOSIS — N2581 Secondary hyperparathyroidism of renal origin: Secondary | ICD-10-CM | POA: Diagnosis not present

## 2022-12-05 DIAGNOSIS — E1110 Type 2 diabetes mellitus with ketoacidosis without coma: Secondary | ICD-10-CM | POA: Diagnosis not present

## 2022-12-05 DIAGNOSIS — Z992 Dependence on renal dialysis: Secondary | ICD-10-CM | POA: Diagnosis not present

## 2022-12-05 DIAGNOSIS — N186 End stage renal disease: Secondary | ICD-10-CM | POA: Diagnosis not present

## 2022-12-06 DIAGNOSIS — Z992 Dependence on renal dialysis: Secondary | ICD-10-CM | POA: Diagnosis not present

## 2022-12-06 DIAGNOSIS — E1122 Type 2 diabetes mellitus with diabetic chronic kidney disease: Secondary | ICD-10-CM | POA: Diagnosis not present

## 2022-12-06 DIAGNOSIS — N186 End stage renal disease: Secondary | ICD-10-CM | POA: Diagnosis not present

## 2022-12-07 DIAGNOSIS — R52 Pain, unspecified: Secondary | ICD-10-CM | POA: Diagnosis not present

## 2022-12-07 DIAGNOSIS — E1110 Type 2 diabetes mellitus with ketoacidosis without coma: Secondary | ICD-10-CM | POA: Diagnosis not present

## 2022-12-07 DIAGNOSIS — N186 End stage renal disease: Secondary | ICD-10-CM | POA: Diagnosis not present

## 2022-12-07 DIAGNOSIS — Z992 Dependence on renal dialysis: Secondary | ICD-10-CM | POA: Diagnosis not present

## 2022-12-07 DIAGNOSIS — N2581 Secondary hyperparathyroidism of renal origin: Secondary | ICD-10-CM | POA: Diagnosis not present

## 2022-12-09 DIAGNOSIS — Z992 Dependence on renal dialysis: Secondary | ICD-10-CM | POA: Diagnosis not present

## 2022-12-09 DIAGNOSIS — N2581 Secondary hyperparathyroidism of renal origin: Secondary | ICD-10-CM | POA: Diagnosis not present

## 2022-12-09 DIAGNOSIS — R52 Pain, unspecified: Secondary | ICD-10-CM | POA: Diagnosis not present

## 2022-12-09 DIAGNOSIS — E1110 Type 2 diabetes mellitus with ketoacidosis without coma: Secondary | ICD-10-CM | POA: Diagnosis not present

## 2022-12-09 DIAGNOSIS — N186 End stage renal disease: Secondary | ICD-10-CM | POA: Diagnosis not present

## 2022-12-12 DIAGNOSIS — E1110 Type 2 diabetes mellitus with ketoacidosis without coma: Secondary | ICD-10-CM | POA: Diagnosis not present

## 2022-12-12 DIAGNOSIS — N2581 Secondary hyperparathyroidism of renal origin: Secondary | ICD-10-CM | POA: Diagnosis not present

## 2022-12-12 DIAGNOSIS — Z992 Dependence on renal dialysis: Secondary | ICD-10-CM | POA: Diagnosis not present

## 2022-12-12 DIAGNOSIS — R52 Pain, unspecified: Secondary | ICD-10-CM | POA: Diagnosis not present

## 2022-12-12 DIAGNOSIS — N186 End stage renal disease: Secondary | ICD-10-CM | POA: Diagnosis not present

## 2022-12-14 DIAGNOSIS — Z992 Dependence on renal dialysis: Secondary | ICD-10-CM | POA: Diagnosis not present

## 2022-12-14 DIAGNOSIS — N186 End stage renal disease: Secondary | ICD-10-CM | POA: Diagnosis not present

## 2022-12-14 DIAGNOSIS — N2581 Secondary hyperparathyroidism of renal origin: Secondary | ICD-10-CM | POA: Diagnosis not present

## 2022-12-14 DIAGNOSIS — R52 Pain, unspecified: Secondary | ICD-10-CM | POA: Diagnosis not present

## 2022-12-14 DIAGNOSIS — E1110 Type 2 diabetes mellitus with ketoacidosis without coma: Secondary | ICD-10-CM | POA: Diagnosis not present

## 2022-12-16 DIAGNOSIS — N186 End stage renal disease: Secondary | ICD-10-CM | POA: Diagnosis not present

## 2022-12-16 DIAGNOSIS — E1110 Type 2 diabetes mellitus with ketoacidosis without coma: Secondary | ICD-10-CM | POA: Diagnosis not present

## 2022-12-16 DIAGNOSIS — Z992 Dependence on renal dialysis: Secondary | ICD-10-CM | POA: Diagnosis not present

## 2022-12-16 DIAGNOSIS — N2581 Secondary hyperparathyroidism of renal origin: Secondary | ICD-10-CM | POA: Diagnosis not present

## 2022-12-16 DIAGNOSIS — R52 Pain, unspecified: Secondary | ICD-10-CM | POA: Diagnosis not present

## 2022-12-19 DIAGNOSIS — E1110 Type 2 diabetes mellitus with ketoacidosis without coma: Secondary | ICD-10-CM | POA: Diagnosis not present

## 2022-12-19 DIAGNOSIS — N2581 Secondary hyperparathyroidism of renal origin: Secondary | ICD-10-CM | POA: Diagnosis not present

## 2022-12-19 DIAGNOSIS — Z992 Dependence on renal dialysis: Secondary | ICD-10-CM | POA: Diagnosis not present

## 2022-12-19 DIAGNOSIS — R52 Pain, unspecified: Secondary | ICD-10-CM | POA: Diagnosis not present

## 2022-12-19 DIAGNOSIS — N186 End stage renal disease: Secondary | ICD-10-CM | POA: Diagnosis not present

## 2022-12-21 DIAGNOSIS — N186 End stage renal disease: Secondary | ICD-10-CM | POA: Diagnosis not present

## 2022-12-21 DIAGNOSIS — R52 Pain, unspecified: Secondary | ICD-10-CM | POA: Diagnosis not present

## 2022-12-21 DIAGNOSIS — N2581 Secondary hyperparathyroidism of renal origin: Secondary | ICD-10-CM | POA: Diagnosis not present

## 2022-12-21 DIAGNOSIS — E1110 Type 2 diabetes mellitus with ketoacidosis without coma: Secondary | ICD-10-CM | POA: Diagnosis not present

## 2022-12-21 DIAGNOSIS — Z992 Dependence on renal dialysis: Secondary | ICD-10-CM | POA: Diagnosis not present

## 2022-12-23 DIAGNOSIS — N186 End stage renal disease: Secondary | ICD-10-CM | POA: Diagnosis not present

## 2022-12-23 DIAGNOSIS — E1110 Type 2 diabetes mellitus with ketoacidosis without coma: Secondary | ICD-10-CM | POA: Diagnosis not present

## 2022-12-23 DIAGNOSIS — N2581 Secondary hyperparathyroidism of renal origin: Secondary | ICD-10-CM | POA: Diagnosis not present

## 2022-12-23 DIAGNOSIS — R52 Pain, unspecified: Secondary | ICD-10-CM | POA: Diagnosis not present

## 2022-12-23 DIAGNOSIS — Z992 Dependence on renal dialysis: Secondary | ICD-10-CM | POA: Diagnosis not present

## 2022-12-26 DIAGNOSIS — E1110 Type 2 diabetes mellitus with ketoacidosis without coma: Secondary | ICD-10-CM | POA: Diagnosis not present

## 2022-12-26 DIAGNOSIS — R52 Pain, unspecified: Secondary | ICD-10-CM | POA: Diagnosis not present

## 2022-12-26 DIAGNOSIS — Z992 Dependence on renal dialysis: Secondary | ICD-10-CM | POA: Diagnosis not present

## 2022-12-26 DIAGNOSIS — N186 End stage renal disease: Secondary | ICD-10-CM | POA: Diagnosis not present

## 2022-12-26 DIAGNOSIS — N2581 Secondary hyperparathyroidism of renal origin: Secondary | ICD-10-CM | POA: Diagnosis not present

## 2022-12-28 DIAGNOSIS — N186 End stage renal disease: Secondary | ICD-10-CM | POA: Diagnosis not present

## 2022-12-28 DIAGNOSIS — Z992 Dependence on renal dialysis: Secondary | ICD-10-CM | POA: Diagnosis not present

## 2022-12-28 DIAGNOSIS — E1110 Type 2 diabetes mellitus with ketoacidosis without coma: Secondary | ICD-10-CM | POA: Diagnosis not present

## 2022-12-28 DIAGNOSIS — N2581 Secondary hyperparathyroidism of renal origin: Secondary | ICD-10-CM | POA: Diagnosis not present

## 2022-12-28 DIAGNOSIS — R52 Pain, unspecified: Secondary | ICD-10-CM | POA: Diagnosis not present

## 2022-12-30 DIAGNOSIS — E1110 Type 2 diabetes mellitus with ketoacidosis without coma: Secondary | ICD-10-CM | POA: Diagnosis not present

## 2022-12-30 DIAGNOSIS — Z992 Dependence on renal dialysis: Secondary | ICD-10-CM | POA: Diagnosis not present

## 2022-12-30 DIAGNOSIS — N186 End stage renal disease: Secondary | ICD-10-CM | POA: Diagnosis not present

## 2022-12-30 DIAGNOSIS — R52 Pain, unspecified: Secondary | ICD-10-CM | POA: Diagnosis not present

## 2022-12-30 DIAGNOSIS — N2581 Secondary hyperparathyroidism of renal origin: Secondary | ICD-10-CM | POA: Diagnosis not present

## 2023-01-02 DIAGNOSIS — Z992 Dependence on renal dialysis: Secondary | ICD-10-CM | POA: Diagnosis not present

## 2023-01-02 DIAGNOSIS — R52 Pain, unspecified: Secondary | ICD-10-CM | POA: Diagnosis not present

## 2023-01-02 DIAGNOSIS — E1110 Type 2 diabetes mellitus with ketoacidosis without coma: Secondary | ICD-10-CM | POA: Diagnosis not present

## 2023-01-02 DIAGNOSIS — N2581 Secondary hyperparathyroidism of renal origin: Secondary | ICD-10-CM | POA: Diagnosis not present

## 2023-01-02 DIAGNOSIS — N186 End stage renal disease: Secondary | ICD-10-CM | POA: Diagnosis not present

## 2023-01-04 DIAGNOSIS — N186 End stage renal disease: Secondary | ICD-10-CM | POA: Diagnosis not present

## 2023-01-04 DIAGNOSIS — R52 Pain, unspecified: Secondary | ICD-10-CM | POA: Diagnosis not present

## 2023-01-04 DIAGNOSIS — Z992 Dependence on renal dialysis: Secondary | ICD-10-CM | POA: Diagnosis not present

## 2023-01-04 DIAGNOSIS — E1110 Type 2 diabetes mellitus with ketoacidosis without coma: Secondary | ICD-10-CM | POA: Diagnosis not present

## 2023-01-04 DIAGNOSIS — N2581 Secondary hyperparathyroidism of renal origin: Secondary | ICD-10-CM | POA: Diagnosis not present

## 2023-01-06 DIAGNOSIS — R52 Pain, unspecified: Secondary | ICD-10-CM | POA: Diagnosis not present

## 2023-01-06 DIAGNOSIS — N2581 Secondary hyperparathyroidism of renal origin: Secondary | ICD-10-CM | POA: Diagnosis not present

## 2023-01-06 DIAGNOSIS — Z992 Dependence on renal dialysis: Secondary | ICD-10-CM | POA: Diagnosis not present

## 2023-01-06 DIAGNOSIS — E1110 Type 2 diabetes mellitus with ketoacidosis without coma: Secondary | ICD-10-CM | POA: Diagnosis not present

## 2023-01-06 DIAGNOSIS — E1122 Type 2 diabetes mellitus with diabetic chronic kidney disease: Secondary | ICD-10-CM | POA: Diagnosis not present

## 2023-01-06 DIAGNOSIS — N186 End stage renal disease: Secondary | ICD-10-CM | POA: Diagnosis not present

## 2023-01-09 DIAGNOSIS — E1110 Type 2 diabetes mellitus with ketoacidosis without coma: Secondary | ICD-10-CM | POA: Diagnosis not present

## 2023-01-09 DIAGNOSIS — N2581 Secondary hyperparathyroidism of renal origin: Secondary | ICD-10-CM | POA: Diagnosis not present

## 2023-01-09 DIAGNOSIS — N186 End stage renal disease: Secondary | ICD-10-CM | POA: Diagnosis not present

## 2023-01-09 DIAGNOSIS — R52 Pain, unspecified: Secondary | ICD-10-CM | POA: Diagnosis not present

## 2023-01-09 DIAGNOSIS — Z992 Dependence on renal dialysis: Secondary | ICD-10-CM | POA: Diagnosis not present

## 2023-01-11 ENCOUNTER — Ambulatory Visit: Payer: 59 | Admitting: Internal Medicine

## 2023-01-11 DIAGNOSIS — R52 Pain, unspecified: Secondary | ICD-10-CM | POA: Diagnosis not present

## 2023-01-11 DIAGNOSIS — N2581 Secondary hyperparathyroidism of renal origin: Secondary | ICD-10-CM | POA: Diagnosis not present

## 2023-01-11 DIAGNOSIS — E1110 Type 2 diabetes mellitus with ketoacidosis without coma: Secondary | ICD-10-CM | POA: Diagnosis not present

## 2023-01-11 DIAGNOSIS — N186 End stage renal disease: Secondary | ICD-10-CM | POA: Diagnosis not present

## 2023-01-11 DIAGNOSIS — Z992 Dependence on renal dialysis: Secondary | ICD-10-CM | POA: Diagnosis not present

## 2023-01-11 NOTE — Progress Notes (Deleted)
I,[name] [name] [name] [name],acting as a scribe for [name] Aliment, MD.,have documented all relevant documentation on the behalf of [name] Aliment, MD,as directed by  [name] Aliment, MD while in the presence of [name] Aliment, MD.  Subjective:  Patient ID: [name] [name] , female    DOB: 06-06-1936 , 88 y.o.   MRN: [id number]  No chief complaint on file.   HPI  Patient is here for DM and HTN follow up. She is accompanied by her daughter, [name] [name] today.  At this time, she denies headaches, chest pain and shortness of breath. She is compliant with dialysis schedule. Unfortunately, she does not know her meds. She is usually brought by another daughter who knows her meds.    Diabetes She presents for her follow-up diabetic visit. She has type 2 diabetes mellitus. Her disease course has been stable. There are no hypoglycemic associated symptoms. There are no diabetic associated symptoms. Pertinent negatives for diabetes include no blurred vision and no chest pain. There are no hypoglycemic complications. Symptoms are stable. There are no diabetic complications. Risk factors for coronary artery disease include diabetes mellitus, obesity and sedentary lifestyle. Current diabetic treatment includes oral agent (dual therapy) and oral agent (monotherapy). She is compliant with treatment all of the time. She is currently taking insulin at bedtime. Insulin injections are given by patient. Rotation sites for injection include the abdominal wall. Her weight is decreasing steadily. She is following a generally healthy diet. When asked about meal planning, she reported none. She participates in exercise every other day. She does not see a podiatrist.Eye exam is current.  Hypertension This is a chronic problem. The current episode started more than 1 year ago. The problem is unchanged. The problem is uncontrolled. Pertinent negatives include no anxiety, blurred vision or chest pain. There are no associated  agents to hypertension. Risk factors for coronary artery disease include obesity and diabetes mellitus. There are no compliance problems.      Past Medical History:  Diagnosis Date   Arthritis    RA   CHF (congestive heart failure) (HCC)    CKD (chronic kidney disease) stage 3, GFR 30-59 ml/min (HCC)    M-W-F dialysis   Dementia (HCC)    Diabetes mellitus without complication (HCC)    insulin dependent   Hyperlipidemia    Hypertension    Seasonal allergies    Shortness of breath dyspnea    occasional with exertion - no oxygen   Sleep apnea    does not use cpap   TIA (transient ischemic attack) 2015     Family History  Problem Relation Age of Onset   Diabetes type II Mother    Hypertension Mother    Diabetes type II Father    Hypertension Other    Hyperlipidemia Other    Stroke Other      Current Outpatient Medications:    amLODipine (NORVASC) 10 MG tablet, TAKE 1 TABLET(10 MG) BY MOUTH DAILY, Disp: 90 tablet, Rfl: 2   apixaban (ELIQUIS) 2.5 MG TABS tablet, TAKE 1 TABLET(2.5 MG) BY MOUTH TWICE DAILY, Disp: 180 tablet, Rfl: 1   ascorbic acid (VITAMIN C) 500 MG tablet, Take 1 tablet (500 mg total) by mouth daily., Disp: , Rfl:    atorvastatin (LIPITOR) 20 MG tablet, TAKE 1 TABLET(20 MG) BY MOUTH DAILY AT 6 PM FOR CHOLESTEROL Strength: 20 mg, Disp: 90 tablet, Rfl: 1   blood glucose meter kit and supplies KIT, Dispense based on patient and insurance preference. Use  up to four times daily as directed. (FOR ICD-9 250.00, 250.01)., Disp: 1 each, Rfl: 0   Blood Pressure Monitor KIT, Use as directed to check blood pressure as needed, Disp: 1 kit, Rfl: 1   cetirizine (ZYRTEC) 10 MG tablet, Take 10 mg by mouth daily., Disp: , Rfl:    cloNIDine (CATAPRES) 0.1 MG tablet, Take 0.1 mg by mouth daily., Disp: , Rfl:    donepezil (ARICEPT) 5 MG tablet, TAKE 1 TABLET(5 MG) BY MOUTH AT BEDTIME, Disp: 90 tablet, Rfl: 2   glucose blood test strip, Use as directed to check blood sugars 3 times  per day dx: e11.65, Disp: 100 each, Rfl: 12   lanthanum (FOSRENOL) 1000 MG chewable tablet, Chew 1,000 mg by mouth 3 (three) times daily with meals. Takes twice daily, Disp: , Rfl:    lidocaine (LMX) 4 % cream, SMARTSIG:Sparingly Topical As Directed, Disp: , Rfl:    loratadine (CLARITIN) 10 MG tablet, Take 10 mg by mouth daily., Disp: , Rfl:    metoprolol tartrate (LOPRESSOR) 25 MG tablet, TAKE 1/2 TABLET(12.5 MG) BY MOUTH TWICE DAILY, Disp: 90 tablet, Rfl: 2   polyethylene glycol (MIRALAX) 17 g packet, Take 17 g by mouth daily as needed., Disp: 14 each, Rfl: 0   zinc sulfate 220 (50 Zn) MG capsule, Take 1 capsule (220 mg total) by mouth daily., Disp: , Rfl:    Allergies  Allergen Reactions   Penicillins Swelling and Rash     Review of Systems  Constitutional: Negative.   Eyes:  Negative for blurred vision.  Respiratory: Negative.    Cardiovascular: Negative.  Negative for chest pain.  Neurological: Negative.   Psychiatric/Behavioral: Negative.       There were no vitals filed for this visit. There is no height or weight on file to calculate BMI.  Wt Readings from Last 3 Encounters:  09/13/22 146 lb 12.8 oz (66.6 kg)  09/08/22 142 lb 6.4 oz (64.6 kg)  05/19/22 137 lb 6.4 oz (62.3 kg)    The ASCVD Risk score (Arnett DK, et al., 2019) failed to calculate for the following reasons:   The 2019 ASCVD risk score is only valid for ages 58 to 54  Objective:  Physical Exam      Assessment And Plan:  There are no diagnoses linked to this encounter.   No follow-ups on file.  Patient was given opportunity to ask questions. Patient verbalized understanding of the plan and was able to repeat key elements of the plan. All questions were answered to their satisfaction.  [name] Aliment, MD  I, [name] Aliment, MD, have reviewed all documentation for this visit. The documentation on 01/11/23 for the exam, diagnosis, procedures, and orders are all accurate and complete.   IF YOU HAVE BEEN  REFERRED TO A SPECIALIST, IT MAY TAKE 1-2 WEEKS TO SCHEDULE/PROCESS THE REFERRAL. IF YOU HAVE NOT HEARD FROM US/SPECIALIST IN TWO WEEKS, PLEASE GIVE [place] A CALL AT [phone].

## 2023-01-12 ENCOUNTER — Telehealth: Payer: Self-pay

## 2023-01-12 NOTE — Progress Notes (Unsigned)
Care Management & Coordination Services Pharmacy Team  Reason for Encounter: Chart review  Contacted patient to confirm in office appointment with Cherylin Mylar, PharmD on 01-17-2023 at 3:30. {US [facility] Outreach:28874}  01-12-2023: 1st attempt left VM 01-13-2023: 2nd attempt left VM  Do you have any problems getting your medications? {yes/no:20286} If yes what types of problems are you experiencing? {Problems:27223}  What is your top health concern you would like to discuss at your upcoming visit?   Have you seen any other providers since your last visit with PCP? {yes/no:20286}   Chart review: Recent office visits:  09-08-2022 Dorothyann Peng, MD. Visit for DM and HTN. DG bone density ordered. Patient reported not taking levemir. Flu vaccine given.  Recent consult visits:  10-06-2022 [name] (Nephrology). Unable to view encounter  09-13-2022 Baglia, Corrina, PA-C (Vascular/vein). Follow up visit for ESRD on dialysis. Follow up as needed.  09-07-2022 [name] (Nephrology). Unable to view encounter  08-07-2022 [name] (Nephrology). Unable to view encounter  08-05-2022 [name] (Nephrology). Unable to view encounter  08-03-2022 [name] (Nephrology). Unable to view encounter  07-31-2022 [name] (Nephrology). Unable to view encounter  07-29-2022 [name] (Nephrology). Unable to view encounter  07-27-2022 [name] (Nephrology). Unable to view encounter  07-25-2022 [name] (Nephrology). Unable to view encounter  07-22-2022 [name] (Nephrology). Unable to view encounter  07-20-2022 [name] (Nephrology). Unable to view encounter  07-18-2022 [name] (Nephrology). Unable to view encounter  07-15-2022 [name] (Nephrology). Unable to view encounter  Hospital visits:  None in previous 6 months   Star Rating Drugs:  Atorvastatin 20 mg- Last filled     Care Gaps: Annual wellness visit in last year? Yes Shingrix overdue Foot exam overdue 2nd tdap overdue Dexa scan never done   If Diabetic: Last eye exam / retinopathy screening: 12-01-2021 Last diabetic foot exam: None   Huey Romans [facility] Clinical Pharmacist Assistant [phone]

## 2023-01-13 DIAGNOSIS — N2581 Secondary hyperparathyroidism of renal origin: Secondary | ICD-10-CM | POA: Diagnosis not present

## 2023-01-13 DIAGNOSIS — E1110 Type 2 diabetes mellitus with ketoacidosis without coma: Secondary | ICD-10-CM | POA: Diagnosis not present

## 2023-01-13 DIAGNOSIS — N186 End stage renal disease: Secondary | ICD-10-CM | POA: Diagnosis not present

## 2023-01-13 DIAGNOSIS — R52 Pain, unspecified: Secondary | ICD-10-CM | POA: Diagnosis not present

## 2023-01-13 DIAGNOSIS — Z992 Dependence on renal dialysis: Secondary | ICD-10-CM | POA: Diagnosis not present

## 2023-01-16 DIAGNOSIS — Z992 Dependence on renal dialysis: Secondary | ICD-10-CM | POA: Diagnosis not present

## 2023-01-16 DIAGNOSIS — R52 Pain, unspecified: Secondary | ICD-10-CM | POA: Diagnosis not present

## 2023-01-16 DIAGNOSIS — N186 End stage renal disease: Secondary | ICD-10-CM | POA: Diagnosis not present

## 2023-01-16 DIAGNOSIS — E1110 Type 2 diabetes mellitus with ketoacidosis without coma: Secondary | ICD-10-CM | POA: Diagnosis not present

## 2023-01-16 DIAGNOSIS — N2581 Secondary hyperparathyroidism of renal origin: Secondary | ICD-10-CM | POA: Diagnosis not present

## 2023-01-17 NOTE — Progress Notes (Unsigned)
Care Management & Coordination Services Pharmacy Note  01/17/2023 Name:  [name] [name] MRN:  [id number] DOB:  07-May-1936  Summary: ***  Recommendations/Changes made from today's visit: ***  Follow up plan: ***   Subjective: [name] [name] is an 87 y.o. year old female who is a primary patient of Dorothyann Peng, MD.  The care coordination team was consulted for assistance with disease management and care coordination needs.    {CCMTELEPHONEFACETOFACE:21091510} for {CCMINITIALFOLLOWUPCHOICE:21091511}.  Recent office visits: ***  Recent consult visits: ***  Hospital visits: {Hospital DC Yes/No:25215}   Objective:  Lab Results  Component Value Date   CREATININE 5.33 (H) 03/01/2022   BUN 22 03/01/2022   EGFR 7 (L) 03/01/2022   GFRNONAA 8 (L) 12/18/2021   GFRAA 18 (L) 05/05/2020   NA 137 03/01/2022   K 3.8 03/01/2022   CALCIUM 9.0 09/08/2022   CO2 26 03/01/2022   GLUCOSE 176 (H) 03/01/2022    Lab Results  Component Value Date/Time   HGBA1C 5.8 (H) 09/08/2022 03:22 PM   HGBA1C 5.6 03/01/2022 04:48 PM   MICROALBUR 150 01/30/2020 04:52 PM   MICROALBUR 150 01/23/2019 04:47 PM    Last diabetic Eye exam:  Lab Results  Component Value Date/Time   HMDIABEYEEXA No Retinopathy 12/01/2020 12:00 AM    Last diabetic Foot exam: No results found for: "HMDIABFOOTEX"   Lab Results  Component Value Date   CHOL 229 (H) 09/08/2022   HDL 75 09/08/2022   LDLCALC 123 (H) 09/08/2022   TRIG 180 (H) 09/08/2022   CHOLHDL 3.1 09/08/2022       Latest Ref Rng & Units 03/01/2022    4:48 PM 12/18/2021    1:02 PM 07/30/2021    4:50 AM  Hepatic Function  Total Protein 6.0 - 8.5 g/dL 6.7  7.5    Albumin 3.7 - 4.7 g/dL 4.0  3.5  2.4   AST 0 - 40 IU/L 14  29    ALT 0 - 32 IU/L 9  11    Alk Phosphatase 44 - 121 IU/L 70  95    Total Bilirubin 0.0 - 1.2 mg/dL 0.4  0.8      Lab Results  Component Value Date/Time   TSH 1.180 09/08/2022 03:22 PM   TSH 1.430  09/24/2020 02:06 PM       Latest Ref Rng & Units 03/01/2022    4:48 PM 12/18/2021    1:02 PM 08/20/2021    6:13 PM  CBC  WBC 3.4 - 10.8 x10E3/uL 4.0  4.9  5.5   Hemoglobin 11.1 - 15.9 g/dL 29.5  28.4  6.7   Hematocrit 34.0 - 46.6 % 36.0  40.2  21.9   Platelets 150 - 450 x10E3/uL 174  133  328     Lab Results  Component Value Date/Time   VD25OH 34.61 07/22/2021 03:40 AM   VD25OH 23.6 (L) 05/27/2015 02:36 AM   VITAMINB12 958 09/24/2020 02:06 PM   VITAMINB12 1,205 (H) 07/30/2020 05:00 AM    Clinical ASCVD: {YES/NO:21197} The ASCVD Risk score (Arnett DK, et al., 2019) failed to calculate for the following reasons:   The 2019 ASCVD risk score is only valid for ages 97 to 61    ***Other: (CHADS2VASc if Afib, MMRC or CAT for COPD, ACT, DEXA)     05/19/2022    4:14 PM 05/13/2021    2:45 PM 04/01/2021    2:22 PM  Depression screen PHQ 2/9  Decreased Interest 0 0 0  Down, Depressed, Hopeless  0 0 0  PHQ - 2 Score 0 0 0  Altered sleeping   0  Tired, decreased energy   0  Change in appetite   0  Feeling bad or failure about yourself    0  Trouble concentrating   0  Moving slowly or fidgety/restless   0  Suicidal thoughts   0  PHQ-9 Score   0     Social History   Tobacco Use  Smoking Status Never  Smokeless Tobacco Never   BP Readings from Last 3 Encounters:  09/13/22 (!) 180/79  09/08/22 (!) 146/74  05/19/22 (!) 160/68   Pulse Readings from Last 3 Encounters:  09/13/22 80  09/08/22 79  05/19/22 83   Wt Readings from Last 3 Encounters:  09/13/22 146 lb 12.8 oz (66.6 kg)  09/08/22 142 lb 6.4 oz (64.6 kg)  05/19/22 137 lb 6.4 oz (62.3 kg)   BMI Readings from Last 3 Encounters:  09/13/22 29.65 kg/m  09/08/22 28.76 kg/m  05/19/22 27.75 kg/m    Allergies  Allergen Reactions   Penicillins Swelling and Rash    Medications Reviewed Today     Reviewed by Graceann Congress, PA-C (Physician Assistant Certified) on 09/13/22 at [phone]  Med List Status: <None>    Medication Order Taking? Sig Documenting Provider Last Dose Status Informant  amLODipine (NORVASC) 10 MG tablet [id number] Yes TAKE 1 TABLET(10 MG) BY MOUTH DAILY Dorothyann Peng, MD Taking Active   apixaban (ELIQUIS) 2.5 MG TABS tablet [id number] Yes TAKE 1 TABLET(2.5 MG) BY MOUTH TWICE DAILY Dorothyann Peng, MD Taking Active   ascorbic acid (VITAMIN C) 500 MG tablet [id number] Yes Take 1 tablet (500 mg total) by mouth daily. Standley Brooking, MD Taking Active Multiple Informants  atorvastatin (LIPITOR) 20 MG tablet [id number] Yes TAKE 1 TABLET(20 MG) BY MOUTH DAILY AT 6 PM FOR CHOLESTEROL Strength: 20 mg Dorothyann Peng, MD Taking Active   blood glucose meter kit and supplies KIT [id number] Yes Dispense based on patient and insurance preference. Use up to four times daily as directed. (FOR ICD-9 250.00, 250.01). Dorothyann Peng, MD Taking Active   Blood Pressure Monitor KIT [id number] Yes Use as directed to check blood pressure as needed Dorothyann Peng, MD Taking Active   cetirizine (ZYRTEC) 10 MG tablet [id number] Yes Take 10 mg by mouth daily. [provider] Taking Active   cloNIDine (CATAPRES) 0.1 MG tablet [id number] Yes Take 0.1 mg by mouth daily. [provider] Taking Active   donepezil (ARICEPT) 5 MG tablet [id number] Yes TAKE 1 TABLET(5 MG) BY MOUTH AT BEDTIME Dorothyann Peng, MD Taking Active   glucose blood test strip [id number] Yes Use as directed to check blood sugars 3 times per day dx: e11.65 Dorothyann Peng, MD Taking Active Multiple Informants  lanthanum (FOSRENOL) 1000 MG chewable tablet [id number] Yes Chew 1,000 mg by mouth 3 (three) times daily with meals. Takes twice daily [provider] Taking Active Child  lidocaine (LMX) 4 % cream [id number] Yes SMARTSIG:Sparingly Topical As Directed [provider] Taking Active   loratadine (CLARITIN) 10 MG tablet [id number] Yes Take 10 mg by mouth daily. [provider] Taking Active Multiple Informants   metoprolol tartrate (LOPRESSOR) 25 MG tablet [id number] Yes TAKE 1/2 TABLET(12.5 MG) BY MOUTH TWICE DAILY Dorothyann Peng, MD Taking Active   polyethylene glycol (MIRALAX) 17 g packet [id number] Yes Take 17 g by mouth daily as needed. Burnadette Pop, MD Taking Active   zinc sulfate 220 (50 Zn) MG capsule [id number] Yes Take  1 capsule (220 mg total) by mouth daily. Standley Brooking, MD Taking Active Multiple Informants  Med List Note Orie Rout, [facility] 07/25/20 1610): Patient's daughter, Elliot Dally [phone]), helps with meds; Per SS records/insurance, pt DOB is Apr 03, 1936            SDOH:  (Social Determinants of Health) assessments and interventions performed: {yes/no:20286} SDOH Interventions    Flowsheet Row Clinical Support from 05/19/2022 in [facility] Chronic Care Management from 10/15/2020 in [facility] Clinical Support from 01/30/2020 in [facility]  SDOH Interventions     Food Insecurity Interventions Intervention Not Indicated Intervention Not Indicated --  Housing Interventions -- Intervention Not Indicated --  Transportation Interventions Intervention Not Indicated Intervention Not Indicated --  Depression Interventions/Treatment  -- -- XBJ4-7 Score <4 Follow-up Not Indicated  Financial Strain Interventions Intervention Not Indicated -- --  Physical Activity Interventions Patient Refused, Other (Comments) -- --  Stress Interventions Intervention Not Indicated -- --       Medication Assistance: {MEDASSISTANCEINFO:25044}  Medication Access: Name and location of current pharmacy:  [id number] [place] Otto, [place] - [address]  [place] [address] Phone: [phone] Fax: [phone]  Within the past 30 days, how often has patient missed a dose of medication? *** Is a pillbox or other method used to improve  adherence? {YES/NO:21197} Factors that may affect medication adherence? {CHL DESC; BARRIERS:21522} Are meds synced by current pharmacy? {YES/NO:21197} Are meds delivered by current pharmacy? {YES/NO:21197} Does patient experience delays in picking up medications due to transportation concerns? {YES/NO:21197}  Compliance/Adherence/Medication fill history: Care Gaps: ***  Star-Rating Drugs: ***   Assessment/Plan   {CCM PHARMD DISEASE STATES:25130}  ***

## 2023-01-18 DIAGNOSIS — Z992 Dependence on renal dialysis: Secondary | ICD-10-CM | POA: Diagnosis not present

## 2023-01-18 DIAGNOSIS — R52 Pain, unspecified: Secondary | ICD-10-CM | POA: Diagnosis not present

## 2023-01-18 DIAGNOSIS — E1110 Type 2 diabetes mellitus with ketoacidosis without coma: Secondary | ICD-10-CM | POA: Diagnosis not present

## 2023-01-18 DIAGNOSIS — N2581 Secondary hyperparathyroidism of renal origin: Secondary | ICD-10-CM | POA: Diagnosis not present

## 2023-01-18 DIAGNOSIS — N186 End stage renal disease: Secondary | ICD-10-CM | POA: Diagnosis not present

## 2023-01-20 DIAGNOSIS — N186 End stage renal disease: Secondary | ICD-10-CM | POA: Diagnosis not present

## 2023-01-20 DIAGNOSIS — R52 Pain, unspecified: Secondary | ICD-10-CM | POA: Diagnosis not present

## 2023-01-20 DIAGNOSIS — N2581 Secondary hyperparathyroidism of renal origin: Secondary | ICD-10-CM | POA: Diagnosis not present

## 2023-01-20 DIAGNOSIS — Z992 Dependence on renal dialysis: Secondary | ICD-10-CM | POA: Diagnosis not present

## 2023-01-20 DIAGNOSIS — E1110 Type 2 diabetes mellitus with ketoacidosis without coma: Secondary | ICD-10-CM | POA: Diagnosis not present

## 2023-01-23 DIAGNOSIS — R52 Pain, unspecified: Secondary | ICD-10-CM | POA: Diagnosis not present

## 2023-01-23 DIAGNOSIS — N2581 Secondary hyperparathyroidism of renal origin: Secondary | ICD-10-CM | POA: Diagnosis not present

## 2023-01-23 DIAGNOSIS — Z992 Dependence on renal dialysis: Secondary | ICD-10-CM | POA: Diagnosis not present

## 2023-01-23 DIAGNOSIS — E1110 Type 2 diabetes mellitus with ketoacidosis without coma: Secondary | ICD-10-CM | POA: Diagnosis not present

## 2023-01-23 DIAGNOSIS — N186 End stage renal disease: Secondary | ICD-10-CM | POA: Diagnosis not present

## 2023-01-30 DIAGNOSIS — N2581 Secondary hyperparathyroidism of renal origin: Secondary | ICD-10-CM | POA: Diagnosis not present

## 2023-01-30 DIAGNOSIS — E1110 Type 2 diabetes mellitus with ketoacidosis without coma: Secondary | ICD-10-CM | POA: Diagnosis not present

## 2023-01-30 DIAGNOSIS — R52 Pain, unspecified: Secondary | ICD-10-CM | POA: Diagnosis not present

## 2023-01-30 DIAGNOSIS — Z992 Dependence on renal dialysis: Secondary | ICD-10-CM | POA: Diagnosis not present

## 2023-01-30 DIAGNOSIS — N186 End stage renal disease: Secondary | ICD-10-CM | POA: Diagnosis not present

## 2023-01-31 ENCOUNTER — Ambulatory Visit (INDEPENDENT_AMBULATORY_CARE_PROVIDER_SITE_OTHER): Payer: 59 | Admitting: Internal Medicine

## 2023-01-31 ENCOUNTER — Encounter: Payer: Self-pay | Admitting: Internal Medicine

## 2023-01-31 VITALS — BP 136/70 | HR 83 | Temp 98.1°F | Ht 59.0 in | Wt 143.8 lb

## 2023-01-31 DIAGNOSIS — N186 End stage renal disease: Secondary | ICD-10-CM | POA: Diagnosis not present

## 2023-01-31 DIAGNOSIS — Z794 Long term (current) use of insulin: Secondary | ICD-10-CM

## 2023-01-31 DIAGNOSIS — I480 Paroxysmal atrial fibrillation: Secondary | ICD-10-CM | POA: Diagnosis not present

## 2023-01-31 DIAGNOSIS — N2581 Secondary hyperparathyroidism of renal origin: Secondary | ICD-10-CM

## 2023-01-31 DIAGNOSIS — I5022 Chronic systolic (congestive) heart failure: Secondary | ICD-10-CM

## 2023-01-31 DIAGNOSIS — D6869 Other thrombophilia: Secondary | ICD-10-CM | POA: Diagnosis not present

## 2023-01-31 DIAGNOSIS — E1122 Type 2 diabetes mellitus with diabetic chronic kidney disease: Secondary | ICD-10-CM

## 2023-01-31 DIAGNOSIS — R413 Other amnesia: Secondary | ICD-10-CM | POA: Diagnosis not present

## 2023-01-31 DIAGNOSIS — I132 Hypertensive heart and chronic kidney disease with heart failure and with stage 5 chronic kidney disease, or end stage renal disease: Secondary | ICD-10-CM

## 2023-01-31 NOTE — Progress Notes (Signed)
I,[name] [name], CMA,acting as a Neurosurgeon for [name] Aliment, MD.,have documented all relevant documentation on the behalf of [name] Aliment, MD,as directed by  [name] Aliment, MD while in the presence of [name] Aliment, MD.  Subjective:  Patient ID: [name] [name] , female    DOB: 1935-12-23 , 87 y.o.   MRN: [id number]  Chief Complaint  Patient presents with   Diabetes   Hypertension    HPI  Patient is here for DM and HTN follow up. She is accompanied by her daughter, [name] [name] today.  At this time, she denies headaches, chest pain and shortness of breath. Her daughter adds, [name] [name] now refuses to be compliant with dialysis schedule. She only went one day last week, she did have LE swelling and SOB later in the week. She did go to dialysis this past Monday. Pt states she is "87 years old and should be able to do what she wants".   Transactrx rejected for shingles & Tdap.     Diabetes She presents for her follow-up diabetic visit. She has type 2 diabetes mellitus. Her disease course has been stable. There are no hypoglycemic associated symptoms. There are no diabetic associated symptoms. Pertinent negatives for diabetes include no blurred vision and no chest pain. There are no hypoglycemic complications. Symptoms are stable. There are no diabetic complications. Risk factors for coronary artery disease include diabetes mellitus, obesity and sedentary lifestyle. Current diabetic treatment includes oral agent (dual therapy) and oral agent (monotherapy). She is compliant with treatment all of the time. She is currently taking insulin at bedtime. Insulin injections are given by patient. Rotation sites for injection include the abdominal wall. Her weight is decreasing steadily. She is following a generally healthy diet. When asked about meal planning, she reported none. She participates in exercise every other day. She does not see a podiatrist.Eye exam is current.  Hypertension This is a  chronic problem. The current episode started more than 1 year ago. The problem is unchanged. The problem is uncontrolled. Pertinent negatives include no anxiety, blurred vision or chest pain. There are no associated agents to hypertension. Risk factors for coronary artery disease include obesity and diabetes mellitus. There are no compliance problems.      Past Medical History:  Diagnosis Date   Arthritis    RA   CHF (congestive heart failure) (HCC)    CKD (chronic kidney disease) stage 3, GFR 30-59 ml/min (HCC)    M-W-F dialysis   Dementia (HCC)    Diabetes mellitus without complication (HCC)    insulin dependent   Hyperlipidemia    Hypertension    Seasonal allergies    Shortness of breath dyspnea    occasional with exertion - no oxygen   Sleep apnea    does not use cpap   TIA (transient ischemic attack) 2015     Family History  Problem Relation Age of Onset   Diabetes type II Mother    Hypertension Mother    Diabetes type II Father    Hypertension Other    Hyperlipidemia Other    Stroke Other      Current Outpatient Medications:    amLODipine (NORVASC) 10 MG tablet, TAKE 1 TABLET(10 MG) BY MOUTH DAILY, Disp: 90 tablet, Rfl: 2   apixaban (ELIQUIS) 2.5 MG TABS tablet, TAKE 1 TABLET(2.5 MG) BY MOUTH TWICE DAILY, Disp: 180 tablet, Rfl: 1   blood glucose meter kit and supplies KIT, Dispense based on patient and insurance preference. Use  up to four times daily as directed. (FOR ICD-9 250.00, 250.01)., Disp: 1 each, Rfl: 0   Blood Pressure Monitor KIT, Use as directed to check blood pressure as needed, Disp: 1 kit, Rfl: 1   cetirizine (ZYRTEC) 10 MG tablet, Take 10 mg by mouth daily., Disp: , Rfl:    donepezil (ARICEPT) 5 MG tablet, TAKE 1 TABLET(5 MG) BY MOUTH AT BEDTIME, Disp: 90 tablet, Rfl: 2   glucose blood test strip, Use as directed to check blood sugars 3 times per day dx: e11.65, Disp: 100 each, Rfl: 12   lidocaine (LMX) 4 % cream, SMARTSIG:Sparingly Topical As Directed,  Disp: , Rfl:    loratadine (CLARITIN) 10 MG tablet, Take 10 mg by mouth daily., Disp: , Rfl:    metoprolol tartrate (LOPRESSOR) 25 MG tablet, TAKE 1/2 TABLET(12.5 MG) BY MOUTH TWICE DAILY, Disp: 90 tablet, Rfl: 2   polyethylene glycol (MIRALAX) 17 g packet, Take 17 g by mouth daily as needed., Disp: 14 each, Rfl: 0   sevelamer (RENAGEL) 800 MG tablet, Take by mouth 3 (three) times daily with meals., Disp: , Rfl:    ascorbic acid (VITAMIN C) 500 MG tablet, Take 1 tablet (500 mg total) by mouth daily. (Patient not taking: Reported on 01/31/2023), Disp: , Rfl:    atorvastatin (LIPITOR) 20 MG tablet, TAKE 1 TABLET(20 MG) BY MOUTH DAILY AT 6 PM FOR CHOLESTEROL Strength: 20 mg (Patient not taking: Reported on 01/31/2023), Disp: 90 tablet, Rfl: 1   lanthanum (FOSRENOL) 1000 MG chewable tablet, Chew 1,000 mg by mouth 3 (three) times daily with meals. Takes twice daily (Patient not taking: Reported on 01/31/2023), Disp: , Rfl:    zinc sulfate 220 (50 Zn) MG capsule, Take 1 capsule (220 mg total) by mouth daily. (Patient not taking: Reported on 01/31/2023), Disp: , Rfl:    Allergies  Allergen Reactions   Penicillins Swelling and Rash     Review of Systems  Constitutional: Negative.   Eyes:  Negative for blurred vision.  Respiratory: Negative.    Cardiovascular: Negative.  Negative for chest pain.  Gastrointestinal: Negative.   Musculoskeletal: Negative.   Neurological: Negative.   Psychiatric/Behavioral: Negative.       Today's Vitals   01/31/23 1147 01/31/23 1213  BP: (!) 140/70 136/70  Pulse: 83   Temp: 98.1 F (36.7 C)   SpO2: 98%   Weight: 143 lb 12.8 oz (65.2 kg)   Height: 4\' 11"  (1.499 m)    Body mass index is 29.04 kg/m.  Wt Readings from Last 3 Encounters:  01/31/23 143 lb 12.8 oz (65.2 kg)  09/13/22 146 lb 12.8 oz (66.6 kg)  09/08/22 142 lb 6.4 oz (64.6 kg)    The ASCVD Risk score (Arnett DK, et al., 2019) failed to calculate for the following reasons:   The 2019 ASCVD risk  score is only valid for ages 22 to 76  Objective:  Physical Exam Vitals and nursing note reviewed.  Constitutional:      Appearance: Normal appearance.  HENT:     Head: Normocephalic and atraumatic.  Eyes:     Extraocular Movements: Extraocular movements intact.  Cardiovascular:     Rate and Rhythm: Normal rate and regular rhythm.     Heart sounds: Normal heart sounds.  Pulmonary:     Effort: Pulmonary effort is normal.     Breath sounds: Normal breath sounds.  Musculoskeletal:     Cervical back: Normal range of motion.     Comments: Left upr arm fistula  Skin:    General: Skin is warm.  Neurological:     General: No focal deficit present.     Mental Status: She is alert.  Psychiatric:        Mood and Affect: Mood normal.        Behavior: Behavior normal.         Assessment And Plan:  1. 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 She is reminded to avoid NSAIDs. Importance of dialysis compliance stressed to patient. - CBC - CMP14+EGFR - Hemoglobin A1c - Iron, TIBC and Ferritin Panel  2. 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 She will continue with amlodipine 10mg  and metoprolol 25mg  1/2 tab twice daily. reminded to follow a low sodium diet.  3. Chronic systolic congestive heart failure (HCC) Comments: Chronic, importance of dietary compliance was discussed with the patient. She is encouraged to limit her sodium intake.  4. Paroxysmal atrial fibrillation (HCC) Comments: Chronic, she is currently rate controlled and properly anticoagulated.  5. Hyperparathyroidism, secondary renal (HCC) Comments: Chronic, I will request most recent lab results.  6. Acquired thrombophilia (HCC) Comments: Chronic, she is currently on Eliquis due to underlying PAF.    Return if  symptoms worsen or fail to improve.  Patient was given opportunity to ask questions. Patient verbalized understanding of the plan and was able to repeat key elements of the plan. All questions were answered to their satisfaction.   I, [name] Aliment, MD, have reviewed all documentation for this visit. The documentation on 01/31/23 for the exam, diagnosis, procedures, and orders are all accurate and complete.   IF YOU HAVE BEEN REFERRED TO A SPECIALIST, IT MAY TAKE 1-2 WEEKS TO SCHEDULE/PROCESS THE REFERRAL. IF YOU HAVE NOT HEARD FROM US/SPECIALIST IN TWO WEEKS, PLEASE GIVE [place] A CALL AT [phone].

## 2023-01-31 NOTE — Patient Instructions (Signed)
Diabetes Mellitus Action Plan Following a diabetes action plan is a way for you to manage your diabetes (diabetes mellitus) symptoms. The plan is color-coded to help you understand what actions you need to take based on any symptoms you are having. If you have symptoms in the red zone, you need medical care right away. If you have symptoms in the yellow zone, you are having problems. If you have symptoms in the green zone, you are doing well. Learning about and understanding diabetes can take time. Follow the plan that you develop with your health care provider. Know the target range for your blood sugar (glucose) level, and review your treatment plan with your health care provider at each visit. The target range for my blood sugar level is __________________________ mg/dL. Red zone Get medical help right away if you have any of the following symptoms: A blood sugar test result that is below 54 mg/dL (3 mmol/L). A blood sugar test result that is at or above 240 mg/dL (13.3 mmol/L) for 2 days in a row. Confusion or trouble thinking clearly. Difficulty breathing. Sickness or a fever for 2 or more days that is not getting better. Moderate or large ketone levels in your urine. Feeling tired or having no energy. If you have any red zone symptoms, do not wait to see if the symptoms will go away. Get medical help right away. Call your local emergency services (911 in the [place] Do not drive yourself to the hospital. If you have severely low blood sugar (severe hypoglycemia) and you cannot eat or drink, you may need glucagon. Make sure a family member or close friend knows how to check your blood sugar and how to give you glucagon. You may need to be treated in a hospital for this condition. Yellow zone If you have any of the following symptoms, your diabetes is not under control and you may need to make some changes: A blood sugar test result that is at or above 240 mg/dL (13.3 mmol/L) for 2 days in a  row. Blood sugar test results that are below 70 mg/dL (3.9 mmol/L). Other symptoms of hypoglycemia, such as: Shaking or feeling light-headed. Confusion or irritability. Feeling hungry. Having a fast heartbeat. If you have any yellow zone symptoms: Treat your hypoglycemia by eating or drinking 15 grams of a rapid-acting carbohydrate. Follow the 15:15 rule: Take 15 grams of a rapid-acting carbohydrate, such as: 1 tube of glucose gel. 4 glucose pills. 4 oz (120 mL) of fruit juice. 4 oz (120 mL) of regular (not diet) soda. Check your blood sugar 15 minutes after you take the carbohydrate. If the repeat blood sugar test is still at or below 70 mg/dL (3.9 mmol/L), take 15 grams of a carbohydrate again. If your blood sugar does not increase above 70 mg/dL (3.9 mmol/L) after 3 tries, get medical help right away. After your blood sugar returns to normal, eat a meal or a snack within 1 hour. Keep taking your daily medicines as told by your health care provider. Check your blood sugar more often than you normally would. Write down your results. Call your health care provider if you have trouble keeping your blood sugar in your target range.  Green zone These signs mean you are doing well and you can continue what you are doing to manage your diabetes: Your blood sugar is within your personal target range. For most people, a blood sugar level before a meal (preprandial) should be 80-130 mg/dL (4.4-7.2 mmol/L). You feel   well, and you are able to do daily activities. If you are in the green zone, continue to manage your diabetes as told by your health care provider. To do this: Eat a healthy diet. Exercise regularly. Check your blood sugar as told by your health care provider. Take your medicines as told by your health care provider.  Where to find more information American Diabetes Association (ADA): diabetes.org Association of Diabetes Care & Education Specialists (ADCES):  diabeteseducator.org Summary Following a diabetes action plan is a way for you to manage your diabetes symptoms. The plan is color-coded to help you understand what actions you need to take based on any symptoms you are having. Follow the plan that you develop with your health care provider. Make sure you know your personal target blood sugar level. Review your treatment plan with your health care provider at each visit. This information is not intended to replace advice given to you by your health care provider. Make sure you discuss any questions you have with your health care provider. Document Revised: 01/30/2020 Document Reviewed: 01/30/2020 Elsevier Patient Education  2024 Elsevier Inc.  

## 2023-02-01 DIAGNOSIS — R52 Pain, unspecified: Secondary | ICD-10-CM | POA: Diagnosis not present

## 2023-02-01 DIAGNOSIS — E1110 Type 2 diabetes mellitus with ketoacidosis without coma: Secondary | ICD-10-CM | POA: Diagnosis not present

## 2023-02-01 DIAGNOSIS — N186 End stage renal disease: Secondary | ICD-10-CM | POA: Diagnosis not present

## 2023-02-01 DIAGNOSIS — N2581 Secondary hyperparathyroidism of renal origin: Secondary | ICD-10-CM | POA: Diagnosis not present

## 2023-02-01 DIAGNOSIS — Z992 Dependence on renal dialysis: Secondary | ICD-10-CM | POA: Diagnosis not present

## 2023-02-01 LAB — HEMOGLOBIN A1C
Est. average glucose Bld gHb Est-mCnc: 131 mg/dL
Hgb A1c MFr Bld: 6.2 % — ABNORMAL HIGH (ref 4.8–5.6)

## 2023-02-01 LAB — IRON,TIBC AND FERRITIN PANEL
Ferritin: 2052 ng/mL — ABNORMAL HIGH (ref 15–150)
Iron Saturation: 51 % (ref 15–55)
Iron: 116 ug/dL (ref 27–139)
Total Iron Binding Capacity: 226 ug/dL — ABNORMAL LOW (ref 250–450)
UIBC: 110 ug/dL — ABNORMAL LOW (ref 118–369)

## 2023-02-01 LAB — CBC
Hematocrit: 34.3 % (ref 34.0–46.6)
Hemoglobin: 10.8 g/dL — ABNORMAL LOW (ref 11.1–15.9)
MCH: 24 pg — ABNORMAL LOW (ref 26.6–33.0)
MCHC: 31.5 g/dL (ref 31.5–35.7)
MCV: 76 fL — ABNORMAL LOW (ref 79–97)
Platelets: 240 10*3/uL (ref 150–450)
RBC: 4.5 x10E6/uL (ref 3.77–5.28)
RDW: 19.7 % — ABNORMAL HIGH (ref 11.7–15.4)
WBC: 4.9 10*3/uL (ref 3.4–10.8)

## 2023-02-01 LAB — CMP14+EGFR
ALT: 8 IU/L (ref 0–32)
AST: 18 IU/L (ref 0–40)
Albumin: 4.2 g/dL (ref 3.7–4.7)
Alkaline Phosphatase: 87 IU/L (ref 44–121)
BUN/Creatinine Ratio: 8 — ABNORMAL LOW (ref 12–28)
BUN: 41 mg/dL — ABNORMAL HIGH (ref 8–27)
Bilirubin Total: 0.3 mg/dL (ref 0.0–1.2)
CO2: 28 mmol/L (ref 20–29)
Calcium: 9 mg/dL (ref 8.7–10.3)
Chloride: 93 mmol/L — ABNORMAL LOW (ref 96–106)
Creatinine, Ser: 4.97 mg/dL — ABNORMAL HIGH (ref 0.57–1.00)
Globulin, Total: 3.5 g/dL (ref 1.5–4.5)
Glucose: 137 mg/dL — ABNORMAL HIGH (ref 70–99)
Potassium: 4.6 mmol/L (ref 3.5–5.2)
Sodium: 138 mmol/L (ref 134–144)
Total Protein: 7.7 g/dL (ref 6.0–8.5)
eGFR: 8 mL/min/{1.73_m2} — ABNORMAL LOW (ref >59)

## 2023-02-02 ENCOUNTER — Encounter: Payer: Self-pay | Admitting: Internal Medicine

## 2023-02-03 DIAGNOSIS — Z992 Dependence on renal dialysis: Secondary | ICD-10-CM | POA: Diagnosis not present

## 2023-02-03 DIAGNOSIS — N186 End stage renal disease: Secondary | ICD-10-CM | POA: Diagnosis not present

## 2023-02-03 DIAGNOSIS — R52 Pain, unspecified: Secondary | ICD-10-CM | POA: Diagnosis not present

## 2023-02-03 DIAGNOSIS — E1110 Type 2 diabetes mellitus with ketoacidosis without coma: Secondary | ICD-10-CM | POA: Diagnosis not present

## 2023-02-03 DIAGNOSIS — I1310 Hypertensive heart and chronic kidney disease without heart failure, with stage 1 through stage 4 chronic kidney disease, or unspecified chronic kidney disease: Secondary | ICD-10-CM | POA: Insufficient documentation

## 2023-02-03 DIAGNOSIS — N2581 Secondary hyperparathyroidism of renal origin: Secondary | ICD-10-CM | POA: Diagnosis not present

## 2023-02-05 DIAGNOSIS — Z992 Dependence on renal dialysis: Secondary | ICD-10-CM | POA: Diagnosis not present

## 2023-02-05 DIAGNOSIS — E1122 Type 2 diabetes mellitus with diabetic chronic kidney disease: Secondary | ICD-10-CM | POA: Diagnosis not present

## 2023-02-05 DIAGNOSIS — N186 End stage renal disease: Secondary | ICD-10-CM | POA: Diagnosis not present

## 2023-02-06 DIAGNOSIS — E1110 Type 2 diabetes mellitus with ketoacidosis without coma: Secondary | ICD-10-CM | POA: Diagnosis not present

## 2023-02-06 DIAGNOSIS — R52 Pain, unspecified: Secondary | ICD-10-CM | POA: Diagnosis not present

## 2023-02-06 DIAGNOSIS — N2581 Secondary hyperparathyroidism of renal origin: Secondary | ICD-10-CM | POA: Diagnosis not present

## 2023-02-06 DIAGNOSIS — Z992 Dependence on renal dialysis: Secondary | ICD-10-CM | POA: Diagnosis not present

## 2023-02-06 DIAGNOSIS — N186 End stage renal disease: Secondary | ICD-10-CM | POA: Diagnosis not present

## 2023-02-08 ENCOUNTER — Other Ambulatory Visit: Payer: Self-pay

## 2023-02-08 DIAGNOSIS — Z992 Dependence on renal dialysis: Secondary | ICD-10-CM | POA: Diagnosis not present

## 2023-02-08 DIAGNOSIS — R52 Pain, unspecified: Secondary | ICD-10-CM | POA: Diagnosis not present

## 2023-02-08 DIAGNOSIS — N186 End stage renal disease: Secondary | ICD-10-CM | POA: Diagnosis not present

## 2023-02-08 DIAGNOSIS — E1110 Type 2 diabetes mellitus with ketoacidosis without coma: Secondary | ICD-10-CM | POA: Diagnosis not present

## 2023-02-08 DIAGNOSIS — N2581 Secondary hyperparathyroidism of renal origin: Secondary | ICD-10-CM | POA: Diagnosis not present

## 2023-02-08 MED ORDER — METOPROLOL TARTRATE 25 MG PO TABS: ORAL_TABLET | ORAL | 2 refills | Status: DC

## 2023-02-08 MED ORDER — AMLODIPINE BESYLATE 10 MG PO TABS: ORAL_TABLET | ORAL | 2 refills | Status: DC

## 2023-02-10 DIAGNOSIS — N2581 Secondary hyperparathyroidism of renal origin: Secondary | ICD-10-CM | POA: Diagnosis not present

## 2023-02-10 DIAGNOSIS — R52 Pain, unspecified: Secondary | ICD-10-CM | POA: Diagnosis not present

## 2023-02-10 DIAGNOSIS — N186 End stage renal disease: Secondary | ICD-10-CM | POA: Diagnosis not present

## 2023-02-10 DIAGNOSIS — E1110 Type 2 diabetes mellitus with ketoacidosis without coma: Secondary | ICD-10-CM | POA: Diagnosis not present

## 2023-02-10 DIAGNOSIS — Z992 Dependence on renal dialysis: Secondary | ICD-10-CM | POA: Diagnosis not present

## 2023-02-13 DIAGNOSIS — E1110 Type 2 diabetes mellitus with ketoacidosis without coma: Secondary | ICD-10-CM | POA: Diagnosis not present

## 2023-02-13 DIAGNOSIS — Z992 Dependence on renal dialysis: Secondary | ICD-10-CM | POA: Diagnosis not present

## 2023-02-13 DIAGNOSIS — N2581 Secondary hyperparathyroidism of renal origin: Secondary | ICD-10-CM | POA: Diagnosis not present

## 2023-02-13 DIAGNOSIS — N186 End stage renal disease: Secondary | ICD-10-CM | POA: Diagnosis not present

## 2023-02-13 DIAGNOSIS — R52 Pain, unspecified: Secondary | ICD-10-CM | POA: Diagnosis not present

## 2023-02-15 ENCOUNTER — Other Ambulatory Visit: Payer: Self-pay

## 2023-02-15 DIAGNOSIS — R52 Pain, unspecified: Secondary | ICD-10-CM | POA: Diagnosis not present

## 2023-02-15 DIAGNOSIS — N186 End stage renal disease: Secondary | ICD-10-CM | POA: Diagnosis not present

## 2023-02-15 DIAGNOSIS — N2581 Secondary hyperparathyroidism of renal origin: Secondary | ICD-10-CM | POA: Diagnosis not present

## 2023-02-15 DIAGNOSIS — Z992 Dependence on renal dialysis: Secondary | ICD-10-CM | POA: Diagnosis not present

## 2023-02-15 DIAGNOSIS — E1110 Type 2 diabetes mellitus with ketoacidosis without coma: Secondary | ICD-10-CM | POA: Diagnosis not present

## 2023-02-15 MED ORDER — METOPROLOL TARTRATE 25 MG PO TABS: ORAL_TABLET | ORAL | 2 refills | Status: DC

## 2023-02-17 DIAGNOSIS — N186 End stage renal disease: Secondary | ICD-10-CM | POA: Diagnosis not present

## 2023-02-17 DIAGNOSIS — Z992 Dependence on renal dialysis: Secondary | ICD-10-CM | POA: Diagnosis not present

## 2023-02-17 DIAGNOSIS — R52 Pain, unspecified: Secondary | ICD-10-CM | POA: Diagnosis not present

## 2023-02-17 DIAGNOSIS — N2581 Secondary hyperparathyroidism of renal origin: Secondary | ICD-10-CM | POA: Diagnosis not present

## 2023-02-17 DIAGNOSIS — E1110 Type 2 diabetes mellitus with ketoacidosis without coma: Secondary | ICD-10-CM | POA: Diagnosis not present

## 2023-02-18 ENCOUNTER — Other Ambulatory Visit: Payer: Self-pay | Admitting: Internal Medicine

## 2023-02-18 DIAGNOSIS — R413 Other amnesia: Secondary | ICD-10-CM

## 2023-02-20 DIAGNOSIS — R52 Pain, unspecified: Secondary | ICD-10-CM | POA: Diagnosis not present

## 2023-02-20 DIAGNOSIS — Z992 Dependence on renal dialysis: Secondary | ICD-10-CM | POA: Diagnosis not present

## 2023-02-20 DIAGNOSIS — N186 End stage renal disease: Secondary | ICD-10-CM | POA: Diagnosis not present

## 2023-02-20 DIAGNOSIS — E1110 Type 2 diabetes mellitus with ketoacidosis without coma: Secondary | ICD-10-CM | POA: Diagnosis not present

## 2023-02-20 DIAGNOSIS — N2581 Secondary hyperparathyroidism of renal origin: Secondary | ICD-10-CM | POA: Diagnosis not present

## 2023-02-22 ENCOUNTER — Ambulatory Visit: Payer: Self-pay | Admitting: *Deleted

## 2023-02-22 DIAGNOSIS — E1110 Type 2 diabetes mellitus with ketoacidosis without coma: Secondary | ICD-10-CM | POA: Diagnosis not present

## 2023-02-22 DIAGNOSIS — Z992 Dependence on renal dialysis: Secondary | ICD-10-CM | POA: Diagnosis not present

## 2023-02-22 DIAGNOSIS — I132 Hypertensive heart and chronic kidney disease with heart failure and with stage 5 chronic kidney disease, or end stage renal disease: Secondary | ICD-10-CM

## 2023-02-22 DIAGNOSIS — R52 Pain, unspecified: Secondary | ICD-10-CM | POA: Diagnosis not present

## 2023-02-22 DIAGNOSIS — N2581 Secondary hyperparathyroidism of renal origin: Secondary | ICD-10-CM | POA: Diagnosis not present

## 2023-02-22 DIAGNOSIS — N186 End stage renal disease: Secondary | ICD-10-CM | POA: Diagnosis not present

## 2023-02-22 DIAGNOSIS — E1122 Type 2 diabetes mellitus with diabetic chronic kidney disease: Secondary | ICD-10-CM

## 2023-02-22 NOTE — Chronic Care Management (AMB) (Signed)
   02/22/2023  Lisbeth Puller Joy [birth year] [phone]   Per message from care guide, unable to reach pt x 3, no further outreach attempts will be made.  Irving Shows [facility], BSN RN Case Manager [facility] [phone]

## 2023-02-24 DIAGNOSIS — Z992 Dependence on renal dialysis: Secondary | ICD-10-CM | POA: Diagnosis not present

## 2023-02-24 DIAGNOSIS — E1110 Type 2 diabetes mellitus with ketoacidosis without coma: Secondary | ICD-10-CM | POA: Diagnosis not present

## 2023-02-24 DIAGNOSIS — N2581 Secondary hyperparathyroidism of renal origin: Secondary | ICD-10-CM | POA: Diagnosis not present

## 2023-02-24 DIAGNOSIS — R52 Pain, unspecified: Secondary | ICD-10-CM | POA: Diagnosis not present

## 2023-02-24 DIAGNOSIS — N186 End stage renal disease: Secondary | ICD-10-CM | POA: Diagnosis not present

## 2023-02-27 DIAGNOSIS — Z992 Dependence on renal dialysis: Secondary | ICD-10-CM | POA: Diagnosis not present

## 2023-02-27 DIAGNOSIS — N2581 Secondary hyperparathyroidism of renal origin: Secondary | ICD-10-CM | POA: Diagnosis not present

## 2023-02-27 DIAGNOSIS — N186 End stage renal disease: Secondary | ICD-10-CM | POA: Diagnosis not present

## 2023-02-27 DIAGNOSIS — E1110 Type 2 diabetes mellitus with ketoacidosis without coma: Secondary | ICD-10-CM | POA: Diagnosis not present

## 2023-02-27 DIAGNOSIS — R52 Pain, unspecified: Secondary | ICD-10-CM | POA: Diagnosis not present

## 2023-03-01 DIAGNOSIS — N2581 Secondary hyperparathyroidism of renal origin: Secondary | ICD-10-CM | POA: Diagnosis not present

## 2023-03-01 DIAGNOSIS — N186 End stage renal disease: Secondary | ICD-10-CM | POA: Diagnosis not present

## 2023-03-01 DIAGNOSIS — Z992 Dependence on renal dialysis: Secondary | ICD-10-CM | POA: Diagnosis not present

## 2023-03-01 DIAGNOSIS — R52 Pain, unspecified: Secondary | ICD-10-CM | POA: Diagnosis not present

## 2023-03-01 DIAGNOSIS — E1110 Type 2 diabetes mellitus with ketoacidosis without coma: Secondary | ICD-10-CM | POA: Diagnosis not present

## 2023-03-03 DIAGNOSIS — N2581 Secondary hyperparathyroidism of renal origin: Secondary | ICD-10-CM | POA: Diagnosis not present

## 2023-03-03 DIAGNOSIS — E1110 Type 2 diabetes mellitus with ketoacidosis without coma: Secondary | ICD-10-CM | POA: Diagnosis not present

## 2023-03-03 DIAGNOSIS — N186 End stage renal disease: Secondary | ICD-10-CM | POA: Diagnosis not present

## 2023-03-03 DIAGNOSIS — R52 Pain, unspecified: Secondary | ICD-10-CM | POA: Diagnosis not present

## 2023-03-03 DIAGNOSIS — Z992 Dependence on renal dialysis: Secondary | ICD-10-CM | POA: Diagnosis not present

## 2023-03-06 DIAGNOSIS — E1110 Type 2 diabetes mellitus with ketoacidosis without coma: Secondary | ICD-10-CM | POA: Diagnosis not present

## 2023-03-06 DIAGNOSIS — N186 End stage renal disease: Secondary | ICD-10-CM | POA: Diagnosis not present

## 2023-03-06 DIAGNOSIS — N2581 Secondary hyperparathyroidism of renal origin: Secondary | ICD-10-CM | POA: Diagnosis not present

## 2023-03-06 DIAGNOSIS — R52 Pain, unspecified: Secondary | ICD-10-CM | POA: Diagnosis not present

## 2023-03-06 DIAGNOSIS — Z992 Dependence on renal dialysis: Secondary | ICD-10-CM | POA: Diagnosis not present

## 2023-03-08 DIAGNOSIS — Z992 Dependence on renal dialysis: Secondary | ICD-10-CM | POA: Diagnosis not present

## 2023-03-08 DIAGNOSIS — N2581 Secondary hyperparathyroidism of renal origin: Secondary | ICD-10-CM | POA: Diagnosis not present

## 2023-03-08 DIAGNOSIS — R52 Pain, unspecified: Secondary | ICD-10-CM | POA: Diagnosis not present

## 2023-03-08 DIAGNOSIS — E1122 Type 2 diabetes mellitus with diabetic chronic kidney disease: Secondary | ICD-10-CM | POA: Diagnosis not present

## 2023-03-08 DIAGNOSIS — N186 End stage renal disease: Secondary | ICD-10-CM | POA: Diagnosis not present

## 2023-03-08 DIAGNOSIS — E1110 Type 2 diabetes mellitus with ketoacidosis without coma: Secondary | ICD-10-CM | POA: Diagnosis not present

## 2023-03-10 DIAGNOSIS — N186 End stage renal disease: Secondary | ICD-10-CM | POA: Diagnosis not present

## 2023-03-10 DIAGNOSIS — E1110 Type 2 diabetes mellitus with ketoacidosis without coma: Secondary | ICD-10-CM | POA: Diagnosis not present

## 2023-03-10 DIAGNOSIS — N2581 Secondary hyperparathyroidism of renal origin: Secondary | ICD-10-CM | POA: Diagnosis not present

## 2023-03-10 DIAGNOSIS — Z992 Dependence on renal dialysis: Secondary | ICD-10-CM | POA: Diagnosis not present

## 2023-03-13 DIAGNOSIS — Z992 Dependence on renal dialysis: Secondary | ICD-10-CM | POA: Diagnosis not present

## 2023-03-13 DIAGNOSIS — N186 End stage renal disease: Secondary | ICD-10-CM | POA: Diagnosis not present

## 2023-03-13 DIAGNOSIS — E1110 Type 2 diabetes mellitus with ketoacidosis without coma: Secondary | ICD-10-CM | POA: Diagnosis not present

## 2023-03-13 DIAGNOSIS — N2581 Secondary hyperparathyroidism of renal origin: Secondary | ICD-10-CM | POA: Diagnosis not present

## 2023-03-15 DIAGNOSIS — N186 End stage renal disease: Secondary | ICD-10-CM | POA: Diagnosis not present

## 2023-03-15 DIAGNOSIS — E1110 Type 2 diabetes mellitus with ketoacidosis without coma: Secondary | ICD-10-CM | POA: Diagnosis not present

## 2023-03-15 DIAGNOSIS — Z992 Dependence on renal dialysis: Secondary | ICD-10-CM | POA: Diagnosis not present

## 2023-03-15 DIAGNOSIS — N2581 Secondary hyperparathyroidism of renal origin: Secondary | ICD-10-CM | POA: Diagnosis not present

## 2023-03-17 DIAGNOSIS — Z992 Dependence on renal dialysis: Secondary | ICD-10-CM | POA: Diagnosis not present

## 2023-03-17 DIAGNOSIS — N2581 Secondary hyperparathyroidism of renal origin: Secondary | ICD-10-CM | POA: Diagnosis not present

## 2023-03-17 DIAGNOSIS — N186 End stage renal disease: Secondary | ICD-10-CM | POA: Diagnosis not present

## 2023-03-17 DIAGNOSIS — E1110 Type 2 diabetes mellitus with ketoacidosis without coma: Secondary | ICD-10-CM | POA: Diagnosis not present

## 2023-03-20 DIAGNOSIS — N186 End stage renal disease: Secondary | ICD-10-CM | POA: Diagnosis not present

## 2023-03-20 DIAGNOSIS — N2581 Secondary hyperparathyroidism of renal origin: Secondary | ICD-10-CM | POA: Diagnosis not present

## 2023-03-20 DIAGNOSIS — Z992 Dependence on renal dialysis: Secondary | ICD-10-CM | POA: Diagnosis not present

## 2023-03-20 DIAGNOSIS — E1110 Type 2 diabetes mellitus with ketoacidosis without coma: Secondary | ICD-10-CM | POA: Diagnosis not present

## 2023-03-22 DIAGNOSIS — N186 End stage renal disease: Secondary | ICD-10-CM | POA: Diagnosis not present

## 2023-03-22 DIAGNOSIS — E1110 Type 2 diabetes mellitus with ketoacidosis without coma: Secondary | ICD-10-CM | POA: Diagnosis not present

## 2023-03-22 DIAGNOSIS — N2581 Secondary hyperparathyroidism of renal origin: Secondary | ICD-10-CM | POA: Diagnosis not present

## 2023-03-22 DIAGNOSIS — Z992 Dependence on renal dialysis: Secondary | ICD-10-CM | POA: Diagnosis not present

## 2023-03-24 DIAGNOSIS — N186 End stage renal disease: Secondary | ICD-10-CM | POA: Diagnosis not present

## 2023-03-24 DIAGNOSIS — E1110 Type 2 diabetes mellitus with ketoacidosis without coma: Secondary | ICD-10-CM | POA: Diagnosis not present

## 2023-03-24 DIAGNOSIS — Z992 Dependence on renal dialysis: Secondary | ICD-10-CM | POA: Diagnosis not present

## 2023-03-24 DIAGNOSIS — N2581 Secondary hyperparathyroidism of renal origin: Secondary | ICD-10-CM | POA: Diagnosis not present

## 2023-03-27 DIAGNOSIS — Z992 Dependence on renal dialysis: Secondary | ICD-10-CM | POA: Diagnosis not present

## 2023-03-27 DIAGNOSIS — N2581 Secondary hyperparathyroidism of renal origin: Secondary | ICD-10-CM | POA: Diagnosis not present

## 2023-03-27 DIAGNOSIS — N186 End stage renal disease: Secondary | ICD-10-CM | POA: Diagnosis not present

## 2023-03-27 DIAGNOSIS — E1110 Type 2 diabetes mellitus with ketoacidosis without coma: Secondary | ICD-10-CM | POA: Diagnosis not present

## 2023-03-31 DIAGNOSIS — Z992 Dependence on renal dialysis: Secondary | ICD-10-CM | POA: Diagnosis not present

## 2023-03-31 DIAGNOSIS — N186 End stage renal disease: Secondary | ICD-10-CM | POA: Diagnosis not present

## 2023-03-31 DIAGNOSIS — N2581 Secondary hyperparathyroidism of renal origin: Secondary | ICD-10-CM | POA: Diagnosis not present

## 2023-03-31 DIAGNOSIS — E1110 Type 2 diabetes mellitus with ketoacidosis without coma: Secondary | ICD-10-CM | POA: Diagnosis not present

## 2023-04-03 DIAGNOSIS — Z992 Dependence on renal dialysis: Secondary | ICD-10-CM | POA: Diagnosis not present

## 2023-04-03 DIAGNOSIS — N2581 Secondary hyperparathyroidism of renal origin: Secondary | ICD-10-CM | POA: Diagnosis not present

## 2023-04-03 DIAGNOSIS — E1110 Type 2 diabetes mellitus with ketoacidosis without coma: Secondary | ICD-10-CM | POA: Diagnosis not present

## 2023-04-03 DIAGNOSIS — N186 End stage renal disease: Secondary | ICD-10-CM | POA: Diagnosis not present

## 2023-04-05 DIAGNOSIS — Z992 Dependence on renal dialysis: Secondary | ICD-10-CM | POA: Diagnosis not present

## 2023-04-05 DIAGNOSIS — E1110 Type 2 diabetes mellitus with ketoacidosis without coma: Secondary | ICD-10-CM | POA: Diagnosis not present

## 2023-04-05 DIAGNOSIS — N2581 Secondary hyperparathyroidism of renal origin: Secondary | ICD-10-CM | POA: Diagnosis not present

## 2023-04-05 DIAGNOSIS — N186 End stage renal disease: Secondary | ICD-10-CM | POA: Diagnosis not present

## 2023-04-07 DIAGNOSIS — E1110 Type 2 diabetes mellitus with ketoacidosis without coma: Secondary | ICD-10-CM | POA: Diagnosis not present

## 2023-04-07 DIAGNOSIS — Z992 Dependence on renal dialysis: Secondary | ICD-10-CM | POA: Diagnosis not present

## 2023-04-07 DIAGNOSIS — N186 End stage renal disease: Secondary | ICD-10-CM | POA: Diagnosis not present

## 2023-04-07 DIAGNOSIS — N2581 Secondary hyperparathyroidism of renal origin: Secondary | ICD-10-CM | POA: Diagnosis not present

## 2023-04-08 DIAGNOSIS — Z992 Dependence on renal dialysis: Secondary | ICD-10-CM | POA: Diagnosis not present

## 2023-04-08 DIAGNOSIS — N186 End stage renal disease: Secondary | ICD-10-CM | POA: Diagnosis not present

## 2023-04-08 DIAGNOSIS — E1122 Type 2 diabetes mellitus with diabetic chronic kidney disease: Secondary | ICD-10-CM | POA: Diagnosis not present

## 2023-04-10 DIAGNOSIS — E1110 Type 2 diabetes mellitus with ketoacidosis without coma: Secondary | ICD-10-CM | POA: Diagnosis not present

## 2023-04-10 DIAGNOSIS — N186 End stage renal disease: Secondary | ICD-10-CM | POA: Diagnosis not present

## 2023-04-10 DIAGNOSIS — N2581 Secondary hyperparathyroidism of renal origin: Secondary | ICD-10-CM | POA: Diagnosis not present

## 2023-04-10 DIAGNOSIS — D631 Anemia in chronic kidney disease: Secondary | ICD-10-CM | POA: Diagnosis not present

## 2023-04-10 DIAGNOSIS — Z992 Dependence on renal dialysis: Secondary | ICD-10-CM | POA: Diagnosis not present

## 2023-04-10 DIAGNOSIS — E1129 Type 2 diabetes mellitus with other diabetic kidney complication: Secondary | ICD-10-CM | POA: Diagnosis not present

## 2023-04-10 DIAGNOSIS — E119 Type 2 diabetes mellitus without complications: Secondary | ICD-10-CM | POA: Diagnosis not present

## 2023-04-12 DIAGNOSIS — Z992 Dependence on renal dialysis: Secondary | ICD-10-CM | POA: Diagnosis not present

## 2023-04-12 DIAGNOSIS — N2581 Secondary hyperparathyroidism of renal origin: Secondary | ICD-10-CM | POA: Diagnosis not present

## 2023-04-12 DIAGNOSIS — E119 Type 2 diabetes mellitus without complications: Secondary | ICD-10-CM | POA: Diagnosis not present

## 2023-04-12 DIAGNOSIS — D631 Anemia in chronic kidney disease: Secondary | ICD-10-CM | POA: Diagnosis not present

## 2023-04-12 DIAGNOSIS — N186 End stage renal disease: Secondary | ICD-10-CM | POA: Diagnosis not present

## 2023-04-12 DIAGNOSIS — E1129 Type 2 diabetes mellitus with other diabetic kidney complication: Secondary | ICD-10-CM | POA: Diagnosis not present

## 2023-04-12 DIAGNOSIS — E1110 Type 2 diabetes mellitus with ketoacidosis without coma: Secondary | ICD-10-CM | POA: Diagnosis not present

## 2023-04-13 ENCOUNTER — Other Ambulatory Visit: Payer: Self-pay | Admitting: *Deleted

## 2023-04-13 DIAGNOSIS — N186 End stage renal disease: Secondary | ICD-10-CM

## 2023-04-14 DIAGNOSIS — N186 End stage renal disease: Secondary | ICD-10-CM | POA: Diagnosis not present

## 2023-04-14 DIAGNOSIS — E1110 Type 2 diabetes mellitus with ketoacidosis without coma: Secondary | ICD-10-CM | POA: Diagnosis not present

## 2023-04-14 DIAGNOSIS — E1129 Type 2 diabetes mellitus with other diabetic kidney complication: Secondary | ICD-10-CM | POA: Diagnosis not present

## 2023-04-14 DIAGNOSIS — E119 Type 2 diabetes mellitus without complications: Secondary | ICD-10-CM | POA: Diagnosis not present

## 2023-04-14 DIAGNOSIS — Z992 Dependence on renal dialysis: Secondary | ICD-10-CM | POA: Diagnosis not present

## 2023-04-14 DIAGNOSIS — N2581 Secondary hyperparathyroidism of renal origin: Secondary | ICD-10-CM | POA: Diagnosis not present

## 2023-04-14 DIAGNOSIS — D631 Anemia in chronic kidney disease: Secondary | ICD-10-CM | POA: Diagnosis not present

## 2023-04-17 DIAGNOSIS — D631 Anemia in chronic kidney disease: Secondary | ICD-10-CM | POA: Diagnosis not present

## 2023-04-17 DIAGNOSIS — E1110 Type 2 diabetes mellitus with ketoacidosis without coma: Secondary | ICD-10-CM | POA: Diagnosis not present

## 2023-04-17 DIAGNOSIS — E1129 Type 2 diabetes mellitus with other diabetic kidney complication: Secondary | ICD-10-CM | POA: Diagnosis not present

## 2023-04-17 DIAGNOSIS — Z992 Dependence on renal dialysis: Secondary | ICD-10-CM | POA: Diagnosis not present

## 2023-04-17 DIAGNOSIS — N2581 Secondary hyperparathyroidism of renal origin: Secondary | ICD-10-CM | POA: Diagnosis not present

## 2023-04-17 DIAGNOSIS — N186 End stage renal disease: Secondary | ICD-10-CM | POA: Diagnosis not present

## 2023-04-17 DIAGNOSIS — E119 Type 2 diabetes mellitus without complications: Secondary | ICD-10-CM | POA: Diagnosis not present

## 2023-04-19 DIAGNOSIS — E119 Type 2 diabetes mellitus without complications: Secondary | ICD-10-CM | POA: Diagnosis not present

## 2023-04-19 DIAGNOSIS — E1110 Type 2 diabetes mellitus with ketoacidosis without coma: Secondary | ICD-10-CM | POA: Diagnosis not present

## 2023-04-19 DIAGNOSIS — Z992 Dependence on renal dialysis: Secondary | ICD-10-CM | POA: Diagnosis not present

## 2023-04-19 DIAGNOSIS — E1129 Type 2 diabetes mellitus with other diabetic kidney complication: Secondary | ICD-10-CM | POA: Diagnosis not present

## 2023-04-19 DIAGNOSIS — N2581 Secondary hyperparathyroidism of renal origin: Secondary | ICD-10-CM | POA: Diagnosis not present

## 2023-04-19 DIAGNOSIS — D631 Anemia in chronic kidney disease: Secondary | ICD-10-CM | POA: Diagnosis not present

## 2023-04-19 DIAGNOSIS — N186 End stage renal disease: Secondary | ICD-10-CM | POA: Diagnosis not present

## 2023-04-20 ENCOUNTER — Ambulatory Visit (INDEPENDENT_AMBULATORY_CARE_PROVIDER_SITE_OTHER): Payer: 59 | Admitting: Physician Assistant

## 2023-04-20 ENCOUNTER — Ambulatory Visit (HOSPITAL_COMMUNITY)
Admission: RE | Admit: 2023-04-20 | Discharge: 2023-04-20 | Disposition: A | Discharge status: Home / Self Care | Payer: 59 | Source: Ambulatory Visit | Attending: Vascular Surgery | Admitting: Vascular Surgery

## 2023-04-20 VITALS — BP 153/74 | HR 71 | Temp 97.5°F | Resp 16 | Ht 59.0 in | Wt 143.0 lb

## 2023-04-20 DIAGNOSIS — N186 End stage renal disease: Secondary | ICD-10-CM | POA: Insufficient documentation

## 2023-04-20 DIAGNOSIS — Z992 Dependence on renal dialysis: Secondary | ICD-10-CM

## 2023-04-20 NOTE — Progress Notes (Signed)
Office Note     CC:  follow up Requesting Provider:  Dorothyann Peng, MD  HPI: [name] [name] is a 87 y.o. ([birth year]) female who presents for evaluation of left arm AV graft which was placed by Dr. Myra Gianotti on 12/10/2020.  She had been experiencing pain especially during cannulation in the area of the AV graft.  She was here for the same complaint in February of this year.  By the time of her visit the pain has mostly resolved.  She denies any needle hole bleeding.  She has been able to complete HD treatments without any complication.  She is here today with her daughter who agrees that symptoms have improved in the last couple weeks.  She is dialyzing on a Monday Wednesday Friday schedule at the [facility] location.   Past Medical History:  Diagnosis Date   Arthritis    RA   CHF (congestive heart failure) (HCC)    CKD (chronic kidney disease) stage 3, GFR 30-59 ml/min (HCC)    M-W-F dialysis   Dementia (HCC)    Diabetes mellitus without complication (HCC)    insulin dependent   Hyperlipidemia    Hypertension    Seasonal allergies    Shortness of breath dyspnea    occasional with exertion - no oxygen   Sleep apnea    does not use cpap   TIA (transient ischemic attack) 2015    Past Surgical History:  Procedure Laterality Date   ABDOMINAL HYSTERECTOMY  1974   AV FISTULA PLACEMENT Left 12/10/2020   Procedure: INSERTION OF ARTERIOVENOUS (AV) GORE-TEX GRAFT LEFT ARM;  Surgeon: Nada Libman, MD;  Location: [facility] OR;  Service: Vascular;  Laterality: Left;   COLONOSCOPY     EYE SURGERY Bilateral    cataracts removed   IR FLUORO GUIDE CV LINE RIGHT  07/29/2020   IR FLUORO GUIDE CV LINE RIGHT  08/04/2020   IR US GUIDE VASC ACCESS RIGHT  07/29/2020   MULTIPLE TOOTH EXTRACTIONS     TOTAL HIP ARTHROPLASTY Right 07/23/2021   Procedure: TOTAL HIP ARTHROPLASTY ANTERIOR APPROACH;  Surgeon: Samson Frederic, MD;  Location: [facility] OR;  Service: Orthopedics;  Laterality: Right;     Social History   Socioeconomic History   Marital status: Widowed    Spouse name: Not on file   Number of children: 6   Years of education: 12th   Highest education level: Not on file  Occupational History   Occupation: retired    Associate Professor: RETIRED  Tobacco Use   Smoking status: Never   Smokeless tobacco: Never  Vaping Use   Vaping status: Never Used  Substance and Sexual Activity   Alcohol use: No    Alcohol/week: 0.0 standard drinks of alcohol   Drug use: No   Sexual activity: Not Currently    Birth control/protection: Surgical    Comment: Hysterectomy  Other Topics Concern   Not on file  Social History Narrative   Patient lives with her husband    Patient is right handed   Patient drinks tea   Social Determinants of Health   Financial Resource Strain: Low Risk  (05/19/2022)   Overall Financial Resource Strain (CARDIA)    Difficulty of Paying Living Expenses: Not hard at all  Food Insecurity: No Food Insecurity (05/19/2022)   Hunger Vital Sign    Worried About Running Out of Food in the Last Year: Never true    Ran Out of Food in the Last Year: Never true  Transportation Needs:  No Transportation Needs (05/19/2022)   PRAPARE - Administrator, Civil Service (Medical): No    Lack of Transportation (Non-Medical): No  Physical Activity: Inactive (05/19/2022)   Exercise Vital Sign    Days of Exercise per Week: 0 days    Minutes of Exercise per Session: 0 min  Stress: No Stress Concern Present (05/19/2022)   [employer] of Occupational Health - Occupational Stress Questionnaire    Feeling of Stress : Not at all  Social Connections: Not on file  Intimate Partner Violence: Not At Risk (01/24/2019)   Humiliation, Afraid, Rape, and Kick questionnaire    Fear of Current or Ex-Partner: No    Emotionally Abused: No    Physically Abused: No    Sexually Abused: No    Family History  Problem Relation Age of Onset   Diabetes type II Mother     Hypertension Mother    Diabetes type II Father    Hypertension Other    Hyperlipidemia Other    Stroke Other     Current Outpatient Medications  Medication Sig Dispense Refill   amLODipine (NORVASC) 10 MG tablet TAKE 1 TABLET(10 MG) BY MOUTH DAILY 90 tablet 2   apixaban (ELIQUIS) 2.5 MG TABS tablet TAKE 1 TABLET(2.5 MG) BY MOUTH TWICE DAILY 180 tablet 1   blood glucose meter kit and supplies KIT Dispense based on patient and insurance preference. Use up to four times daily as directed. (FOR ICD-9 250.00, 250.01). 1 each 0   Blood Pressure Monitor KIT Use as directed to check blood pressure as needed 1 kit 1   cetirizine (ZYRTEC) 10 MG tablet Take 10 mg by mouth daily.     donepezil (ARICEPT) 5 MG tablet TAKE 1 TABLET(5 MG) BY MOUTH AT BEDTIME 90 tablet 2   glucose blood test strip Use as directed to check blood sugars 3 times per day dx: e11.65 100 each 12   lanthanum (FOSRENOL) 1000 MG chewable tablet Chew 1,000 mg by mouth 3 (three) times daily with meals. Takes twice daily     lidocaine (LMX) 4 % cream SMARTSIG:Sparingly Topical As Directed     loratadine (CLARITIN) 10 MG tablet Take 10 mg by mouth daily.     metoprolol tartrate (LOPRESSOR) 25 MG tablet TAKE 1/2 TABLET(12.5 MG) BY MOUTH TWICE DAILY 90 tablet 2   polyethylene glycol (MIRALAX) 17 g packet Take 17 g by mouth daily as needed. 14 each 0   sevelamer (RENAGEL) 800 MG tablet Take by mouth 3 (three) times daily with meals.     zinc sulfate 220 (50 Zn) MG capsule Take 1 capsule (220 mg total) by mouth daily.     ascorbic acid (VITAMIN C) 500 MG tablet Take 1 tablet (500 mg total) by mouth daily. (Patient not taking: Reported on 01/31/2023)     atorvastatin (LIPITOR) 20 MG tablet TAKE 1 TABLET(20 MG) BY MOUTH DAILY AT 6 PM FOR CHOLESTEROL Strength: 20 mg (Patient not taking: Reported on 01/31/2023) 90 tablet 1   No current facility-administered medications for this visit.    Allergies  Allergen Reactions   Penicillins Swelling  and Rash     REVIEW OF SYSTEMS:   [X]  denotes positive finding, [ ]  denotes negative finding Cardiac  Comments:  Chest pain or chest pressure:    Shortness of breath upon exertion:    Short of breath when lying flat:    Irregular heart rhythm:        Vascular    Pain in  calf, thigh, or hip brought on by ambulation:    Pain in feet at night that wakes you up from your sleep:     Blood clot in your veins:    Leg swelling:         Pulmonary    Oxygen at home:    Productive cough:     Wheezing:         Neurologic    Sudden weakness in arms or legs:     Sudden numbness in arms or legs:     Sudden onset of difficulty speaking or slurred speech:    Temporary loss of vision in one eye:     Problems with dizziness:         Gastrointestinal    Blood in stool:     Vomited blood:         Genitourinary    Burning when urinating:     Blood in urine:        Psychiatric    Major depression:         Hematologic    Bleeding problems:    Problems with blood clotting too easily:        Skin    Rashes or ulcers:        Constitutional    Fever or chills:      PHYSICAL EXAMINATION:  Vitals:   04/20/23 0856  BP: (!) 153/74  Pulse: 71  Resp: 16  Temp: (!) 97.5 F (36.4 C)  TempSrc: Temporal  SpO2: 98%  Weight: 143 lb (64.9 kg)  Height: 4\' 11"  (1.499 m)    General:  WDWN in NAD; vital signs documented above Gait: Not observed HENT: WNL, normocephalic Pulmonary: normal non-labored breathing , without Rales, rhonchi,  wheezing Cardiac: regular HR Abdomen: soft, NT, no masses Skin: without rashes Vascular Exam/Pulses: Palpable left radial pulse; palpable thrill throughout the graft in the upper left arm Musculoskeletal: no muscle wasting or atrophy  Neurologic: A&O X 3 Psychiatric:  The pt has Normal affect    ASSESSMENT/PLAN:: 87 y.o. female here for follow up for evaluation of left arm AV graft  Symptoms of pain related to the left arm AV graft especially  related to cannulation have resolved by the time of this office visit.  Her left arm AV graft is functioning appropriately and she has not had any trouble completing HD treatments.  She also denies any steal symptoms in her left hand.  No indication for revision or fistulogram at this time.  She will call/return office if symptoms recur.   Emilie Rutter, PA-C [facility] [phone]  Clinic MD:   Randie Heinz

## 2023-04-21 DIAGNOSIS — N186 End stage renal disease: Secondary | ICD-10-CM | POA: Diagnosis not present

## 2023-04-21 DIAGNOSIS — E119 Type 2 diabetes mellitus without complications: Secondary | ICD-10-CM | POA: Diagnosis not present

## 2023-04-21 DIAGNOSIS — N2581 Secondary hyperparathyroidism of renal origin: Secondary | ICD-10-CM | POA: Diagnosis not present

## 2023-04-21 DIAGNOSIS — D631 Anemia in chronic kidney disease: Secondary | ICD-10-CM | POA: Diagnosis not present

## 2023-04-21 DIAGNOSIS — E1129 Type 2 diabetes mellitus with other diabetic kidney complication: Secondary | ICD-10-CM | POA: Diagnosis not present

## 2023-04-21 DIAGNOSIS — E1110 Type 2 diabetes mellitus with ketoacidosis without coma: Secondary | ICD-10-CM | POA: Diagnosis not present

## 2023-04-21 DIAGNOSIS — Z992 Dependence on renal dialysis: Secondary | ICD-10-CM | POA: Diagnosis not present

## 2023-04-24 DIAGNOSIS — N186 End stage renal disease: Secondary | ICD-10-CM | POA: Diagnosis not present

## 2023-04-24 DIAGNOSIS — Z992 Dependence on renal dialysis: Secondary | ICD-10-CM | POA: Diagnosis not present

## 2023-04-24 DIAGNOSIS — D631 Anemia in chronic kidney disease: Secondary | ICD-10-CM | POA: Diagnosis not present

## 2023-04-24 DIAGNOSIS — E119 Type 2 diabetes mellitus without complications: Secondary | ICD-10-CM | POA: Diagnosis not present

## 2023-04-24 DIAGNOSIS — E1110 Type 2 diabetes mellitus with ketoacidosis without coma: Secondary | ICD-10-CM | POA: Diagnosis not present

## 2023-04-24 DIAGNOSIS — E1129 Type 2 diabetes mellitus with other diabetic kidney complication: Secondary | ICD-10-CM | POA: Diagnosis not present

## 2023-04-24 DIAGNOSIS — N2581 Secondary hyperparathyroidism of renal origin: Secondary | ICD-10-CM | POA: Diagnosis not present

## 2023-04-26 DIAGNOSIS — E1110 Type 2 diabetes mellitus with ketoacidosis without coma: Secondary | ICD-10-CM | POA: Diagnosis not present

## 2023-04-26 DIAGNOSIS — N186 End stage renal disease: Secondary | ICD-10-CM | POA: Diagnosis not present

## 2023-04-26 DIAGNOSIS — D631 Anemia in chronic kidney disease: Secondary | ICD-10-CM | POA: Diagnosis not present

## 2023-04-26 DIAGNOSIS — N2581 Secondary hyperparathyroidism of renal origin: Secondary | ICD-10-CM | POA: Diagnosis not present

## 2023-04-26 DIAGNOSIS — E1129 Type 2 diabetes mellitus with other diabetic kidney complication: Secondary | ICD-10-CM | POA: Diagnosis not present

## 2023-04-26 DIAGNOSIS — E119 Type 2 diabetes mellitus without complications: Secondary | ICD-10-CM | POA: Diagnosis not present

## 2023-04-26 DIAGNOSIS — Z992 Dependence on renal dialysis: Secondary | ICD-10-CM | POA: Diagnosis not present

## 2023-04-28 DIAGNOSIS — E1110 Type 2 diabetes mellitus with ketoacidosis without coma: Secondary | ICD-10-CM | POA: Diagnosis not present

## 2023-04-28 DIAGNOSIS — D631 Anemia in chronic kidney disease: Secondary | ICD-10-CM | POA: Diagnosis not present

## 2023-04-28 DIAGNOSIS — Z992 Dependence on renal dialysis: Secondary | ICD-10-CM | POA: Diagnosis not present

## 2023-04-28 DIAGNOSIS — N2581 Secondary hyperparathyroidism of renal origin: Secondary | ICD-10-CM | POA: Diagnosis not present

## 2023-04-28 DIAGNOSIS — E119 Type 2 diabetes mellitus without complications: Secondary | ICD-10-CM | POA: Diagnosis not present

## 2023-04-28 DIAGNOSIS — N186 End stage renal disease: Secondary | ICD-10-CM | POA: Diagnosis not present

## 2023-04-28 DIAGNOSIS — E1129 Type 2 diabetes mellitus with other diabetic kidney complication: Secondary | ICD-10-CM | POA: Diagnosis not present

## 2023-05-01 DIAGNOSIS — E119 Type 2 diabetes mellitus without complications: Secondary | ICD-10-CM | POA: Diagnosis not present

## 2023-05-01 DIAGNOSIS — E1129 Type 2 diabetes mellitus with other diabetic kidney complication: Secondary | ICD-10-CM | POA: Diagnosis not present

## 2023-05-01 DIAGNOSIS — N2581 Secondary hyperparathyroidism of renal origin: Secondary | ICD-10-CM | POA: Diagnosis not present

## 2023-05-01 DIAGNOSIS — N186 End stage renal disease: Secondary | ICD-10-CM | POA: Diagnosis not present

## 2023-05-01 DIAGNOSIS — E1110 Type 2 diabetes mellitus with ketoacidosis without coma: Secondary | ICD-10-CM | POA: Diagnosis not present

## 2023-05-01 DIAGNOSIS — D631 Anemia in chronic kidney disease: Secondary | ICD-10-CM | POA: Diagnosis not present

## 2023-05-01 DIAGNOSIS — Z992 Dependence on renal dialysis: Secondary | ICD-10-CM | POA: Diagnosis not present

## 2023-05-03 DIAGNOSIS — N2581 Secondary hyperparathyroidism of renal origin: Secondary | ICD-10-CM | POA: Diagnosis not present

## 2023-05-03 DIAGNOSIS — E1129 Type 2 diabetes mellitus with other diabetic kidney complication: Secondary | ICD-10-CM | POA: Diagnosis not present

## 2023-05-03 DIAGNOSIS — E119 Type 2 diabetes mellitus without complications: Secondary | ICD-10-CM | POA: Diagnosis not present

## 2023-05-03 DIAGNOSIS — N186 End stage renal disease: Secondary | ICD-10-CM | POA: Diagnosis not present

## 2023-05-03 DIAGNOSIS — D631 Anemia in chronic kidney disease: Secondary | ICD-10-CM | POA: Diagnosis not present

## 2023-05-03 DIAGNOSIS — E1110 Type 2 diabetes mellitus with ketoacidosis without coma: Secondary | ICD-10-CM | POA: Diagnosis not present

## 2023-05-03 DIAGNOSIS — Z992 Dependence on renal dialysis: Secondary | ICD-10-CM | POA: Diagnosis not present

## 2023-05-08 DIAGNOSIS — E1110 Type 2 diabetes mellitus with ketoacidosis without coma: Secondary | ICD-10-CM | POA: Diagnosis not present

## 2023-05-08 DIAGNOSIS — D631 Anemia in chronic kidney disease: Secondary | ICD-10-CM | POA: Diagnosis not present

## 2023-05-08 DIAGNOSIS — E1129 Type 2 diabetes mellitus with other diabetic kidney complication: Secondary | ICD-10-CM | POA: Diagnosis not present

## 2023-05-08 DIAGNOSIS — N186 End stage renal disease: Secondary | ICD-10-CM | POA: Diagnosis not present

## 2023-05-08 DIAGNOSIS — Z992 Dependence on renal dialysis: Secondary | ICD-10-CM | POA: Diagnosis not present

## 2023-05-08 DIAGNOSIS — E1122 Type 2 diabetes mellitus with diabetic chronic kidney disease: Secondary | ICD-10-CM | POA: Diagnosis not present

## 2023-05-08 DIAGNOSIS — E119 Type 2 diabetes mellitus without complications: Secondary | ICD-10-CM | POA: Diagnosis not present

## 2023-05-08 DIAGNOSIS — N2581 Secondary hyperparathyroidism of renal origin: Secondary | ICD-10-CM | POA: Diagnosis not present

## 2023-05-10 DIAGNOSIS — N2581 Secondary hyperparathyroidism of renal origin: Secondary | ICD-10-CM | POA: Diagnosis not present

## 2023-05-10 DIAGNOSIS — Z992 Dependence on renal dialysis: Secondary | ICD-10-CM | POA: Diagnosis not present

## 2023-05-10 DIAGNOSIS — D631 Anemia in chronic kidney disease: Secondary | ICD-10-CM | POA: Diagnosis not present

## 2023-05-10 DIAGNOSIS — E1110 Type 2 diabetes mellitus with ketoacidosis without coma: Secondary | ICD-10-CM | POA: Diagnosis not present

## 2023-05-10 DIAGNOSIS — N186 End stage renal disease: Secondary | ICD-10-CM | POA: Diagnosis not present

## 2023-05-12 DIAGNOSIS — N186 End stage renal disease: Secondary | ICD-10-CM | POA: Diagnosis not present

## 2023-05-12 DIAGNOSIS — E1110 Type 2 diabetes mellitus with ketoacidosis without coma: Secondary | ICD-10-CM | POA: Diagnosis not present

## 2023-05-12 DIAGNOSIS — Z992 Dependence on renal dialysis: Secondary | ICD-10-CM | POA: Diagnosis not present

## 2023-05-12 DIAGNOSIS — D631 Anemia in chronic kidney disease: Secondary | ICD-10-CM | POA: Diagnosis not present

## 2023-05-12 DIAGNOSIS — N2581 Secondary hyperparathyroidism of renal origin: Secondary | ICD-10-CM | POA: Diagnosis not present

## 2023-05-15 DIAGNOSIS — N2581 Secondary hyperparathyroidism of renal origin: Secondary | ICD-10-CM | POA: Diagnosis not present

## 2023-05-15 DIAGNOSIS — Z992 Dependence on renal dialysis: Secondary | ICD-10-CM | POA: Diagnosis not present

## 2023-05-15 DIAGNOSIS — D631 Anemia in chronic kidney disease: Secondary | ICD-10-CM | POA: Diagnosis not present

## 2023-05-15 DIAGNOSIS — N186 End stage renal disease: Secondary | ICD-10-CM | POA: Diagnosis not present

## 2023-05-15 DIAGNOSIS — E1110 Type 2 diabetes mellitus with ketoacidosis without coma: Secondary | ICD-10-CM | POA: Diagnosis not present

## 2023-05-16 ENCOUNTER — Telehealth: Payer: Self-pay | Admitting: Hematology and Oncology

## 2023-05-16 NOTE — Telephone Encounter (Signed)
05/16/23; Due to a change in the provider schedule; I called the patient and left a voice mail with the rescheduled appointments for lab and Telephone visit. I mailed a reminder notice with the new date and time of the appointments.

## 2023-05-18 ENCOUNTER — Inpatient Hospital Stay: Payer: 59

## 2023-05-19 DIAGNOSIS — E1110 Type 2 diabetes mellitus with ketoacidosis without coma: Secondary | ICD-10-CM | POA: Diagnosis not present

## 2023-05-19 DIAGNOSIS — Z992 Dependence on renal dialysis: Secondary | ICD-10-CM | POA: Diagnosis not present

## 2023-05-19 DIAGNOSIS — N2581 Secondary hyperparathyroidism of renal origin: Secondary | ICD-10-CM | POA: Diagnosis not present

## 2023-05-19 DIAGNOSIS — N186 End stage renal disease: Secondary | ICD-10-CM | POA: Diagnosis not present

## 2023-05-19 DIAGNOSIS — D631 Anemia in chronic kidney disease: Secondary | ICD-10-CM | POA: Diagnosis not present

## 2023-05-22 DIAGNOSIS — D631 Anemia in chronic kidney disease: Secondary | ICD-10-CM | POA: Diagnosis not present

## 2023-05-22 DIAGNOSIS — Z992 Dependence on renal dialysis: Secondary | ICD-10-CM | POA: Diagnosis not present

## 2023-05-22 DIAGNOSIS — E1110 Type 2 diabetes mellitus with ketoacidosis without coma: Secondary | ICD-10-CM | POA: Diagnosis not present

## 2023-05-22 DIAGNOSIS — N2581 Secondary hyperparathyroidism of renal origin: Secondary | ICD-10-CM | POA: Diagnosis not present

## 2023-05-22 DIAGNOSIS — N186 End stage renal disease: Secondary | ICD-10-CM | POA: Diagnosis not present

## 2023-05-24 DIAGNOSIS — N2581 Secondary hyperparathyroidism of renal origin: Secondary | ICD-10-CM | POA: Diagnosis not present

## 2023-05-24 DIAGNOSIS — E1110 Type 2 diabetes mellitus with ketoacidosis without coma: Secondary | ICD-10-CM | POA: Diagnosis not present

## 2023-05-24 DIAGNOSIS — D631 Anemia in chronic kidney disease: Secondary | ICD-10-CM | POA: Diagnosis not present

## 2023-05-24 DIAGNOSIS — N186 End stage renal disease: Secondary | ICD-10-CM | POA: Diagnosis not present

## 2023-05-24 DIAGNOSIS — Z992 Dependence on renal dialysis: Secondary | ICD-10-CM | POA: Diagnosis not present

## 2023-05-25 ENCOUNTER — Telehealth: Payer: Medicare Other | Admitting: Hematology and Oncology

## 2023-05-26 DIAGNOSIS — N2581 Secondary hyperparathyroidism of renal origin: Secondary | ICD-10-CM | POA: Diagnosis not present

## 2023-05-26 DIAGNOSIS — Z992 Dependence on renal dialysis: Secondary | ICD-10-CM | POA: Diagnosis not present

## 2023-05-26 DIAGNOSIS — N186 End stage renal disease: Secondary | ICD-10-CM | POA: Diagnosis not present

## 2023-05-26 DIAGNOSIS — E1110 Type 2 diabetes mellitus with ketoacidosis without coma: Secondary | ICD-10-CM | POA: Diagnosis not present

## 2023-05-26 DIAGNOSIS — D631 Anemia in chronic kidney disease: Secondary | ICD-10-CM | POA: Diagnosis not present

## 2023-05-29 DIAGNOSIS — N2581 Secondary hyperparathyroidism of renal origin: Secondary | ICD-10-CM | POA: Diagnosis not present

## 2023-05-29 DIAGNOSIS — Z992 Dependence on renal dialysis: Secondary | ICD-10-CM | POA: Diagnosis not present

## 2023-05-29 DIAGNOSIS — D631 Anemia in chronic kidney disease: Secondary | ICD-10-CM | POA: Diagnosis not present

## 2023-05-29 DIAGNOSIS — N186 End stage renal disease: Secondary | ICD-10-CM | POA: Diagnosis not present

## 2023-05-29 DIAGNOSIS — E1110 Type 2 diabetes mellitus with ketoacidosis without coma: Secondary | ICD-10-CM | POA: Diagnosis not present

## 2023-05-31 DIAGNOSIS — N186 End stage renal disease: Secondary | ICD-10-CM | POA: Diagnosis not present

## 2023-05-31 DIAGNOSIS — N2581 Secondary hyperparathyroidism of renal origin: Secondary | ICD-10-CM | POA: Diagnosis not present

## 2023-05-31 DIAGNOSIS — E1110 Type 2 diabetes mellitus with ketoacidosis without coma: Secondary | ICD-10-CM | POA: Diagnosis not present

## 2023-05-31 DIAGNOSIS — D631 Anemia in chronic kidney disease: Secondary | ICD-10-CM | POA: Diagnosis not present

## 2023-05-31 DIAGNOSIS — Z992 Dependence on renal dialysis: Secondary | ICD-10-CM | POA: Diagnosis not present

## 2023-06-02 DIAGNOSIS — E1110 Type 2 diabetes mellitus with ketoacidosis without coma: Secondary | ICD-10-CM | POA: Diagnosis not present

## 2023-06-02 DIAGNOSIS — Z992 Dependence on renal dialysis: Secondary | ICD-10-CM | POA: Diagnosis not present

## 2023-06-02 DIAGNOSIS — N2581 Secondary hyperparathyroidism of renal origin: Secondary | ICD-10-CM | POA: Diagnosis not present

## 2023-06-02 DIAGNOSIS — N186 End stage renal disease: Secondary | ICD-10-CM | POA: Diagnosis not present

## 2023-06-02 DIAGNOSIS — D631 Anemia in chronic kidney disease: Secondary | ICD-10-CM | POA: Diagnosis not present

## 2023-06-05 DIAGNOSIS — N2581 Secondary hyperparathyroidism of renal origin: Secondary | ICD-10-CM | POA: Diagnosis not present

## 2023-06-05 DIAGNOSIS — D631 Anemia in chronic kidney disease: Secondary | ICD-10-CM | POA: Diagnosis not present

## 2023-06-05 DIAGNOSIS — Z992 Dependence on renal dialysis: Secondary | ICD-10-CM | POA: Diagnosis not present

## 2023-06-05 DIAGNOSIS — N186 End stage renal disease: Secondary | ICD-10-CM | POA: Diagnosis not present

## 2023-06-05 DIAGNOSIS — E1110 Type 2 diabetes mellitus with ketoacidosis without coma: Secondary | ICD-10-CM | POA: Diagnosis not present

## 2023-06-07 DIAGNOSIS — D631 Anemia in chronic kidney disease: Secondary | ICD-10-CM | POA: Diagnosis not present

## 2023-06-07 DIAGNOSIS — E1110 Type 2 diabetes mellitus with ketoacidosis without coma: Secondary | ICD-10-CM | POA: Diagnosis not present

## 2023-06-07 DIAGNOSIS — N2581 Secondary hyperparathyroidism of renal origin: Secondary | ICD-10-CM | POA: Diagnosis not present

## 2023-06-07 DIAGNOSIS — N186 End stage renal disease: Secondary | ICD-10-CM | POA: Diagnosis not present

## 2023-06-07 DIAGNOSIS — Z992 Dependence on renal dialysis: Secondary | ICD-10-CM | POA: Diagnosis not present

## 2023-06-08 ENCOUNTER — Ambulatory Visit: Payer: 59 | Admitting: Internal Medicine

## 2023-06-08 ENCOUNTER — Ambulatory Visit: Payer: 59

## 2023-06-08 ENCOUNTER — Encounter: Payer: Self-pay | Admitting: Internal Medicine

## 2023-06-08 VITALS — BP 158/90 | HR 82 | Temp 98.1°F | Ht 59.0 in | Wt 141.0 lb

## 2023-06-08 VITALS — BP 170/80 | HR 82 | Temp 98.1°F | Ht 59.0 in | Wt 141.0 lb

## 2023-06-08 DIAGNOSIS — Z0000 Encounter for general adult medical examination without abnormal findings: Secondary | ICD-10-CM

## 2023-06-08 DIAGNOSIS — N186 End stage renal disease: Secondary | ICD-10-CM | POA: Diagnosis not present

## 2023-06-08 DIAGNOSIS — Z992 Dependence on renal dialysis: Secondary | ICD-10-CM | POA: Diagnosis not present

## 2023-06-08 DIAGNOSIS — Z794 Long term (current) use of insulin: Secondary | ICD-10-CM | POA: Diagnosis not present

## 2023-06-08 DIAGNOSIS — Z1231 Encounter for screening mammogram for malignant neoplasm of breast: Secondary | ICD-10-CM | POA: Diagnosis not present

## 2023-06-08 DIAGNOSIS — D6869 Other thrombophilia: Secondary | ICD-10-CM | POA: Diagnosis not present

## 2023-06-08 DIAGNOSIS — I480 Paroxysmal atrial fibrillation: Secondary | ICD-10-CM

## 2023-06-08 DIAGNOSIS — I5022 Chronic systolic (congestive) heart failure: Secondary | ICD-10-CM | POA: Diagnosis not present

## 2023-06-08 DIAGNOSIS — Z23 Encounter for immunization: Secondary | ICD-10-CM | POA: Diagnosis not present

## 2023-06-08 DIAGNOSIS — I132 Hypertensive heart and chronic kidney disease with heart failure and with stage 5 chronic kidney disease, or end stage renal disease: Secondary | ICD-10-CM | POA: Diagnosis not present

## 2023-06-08 DIAGNOSIS — D472 Monoclonal gammopathy: Secondary | ICD-10-CM | POA: Diagnosis not present

## 2023-06-08 DIAGNOSIS — R413 Other amnesia: Secondary | ICD-10-CM

## 2023-06-08 DIAGNOSIS — E2839 Other primary ovarian failure: Secondary | ICD-10-CM

## 2023-06-08 DIAGNOSIS — E1122 Type 2 diabetes mellitus with diabetic chronic kidney disease: Secondary | ICD-10-CM

## 2023-06-08 NOTE — Patient Instructions (Signed)
[name] [name] , Thank you for taking time to come for your Medicare Wellness Visit. I appreciate your ongoing commitment to your health goals. Please review the following plan we discussed and let me know if I can assist you in the future.   Referrals/Orders/Follow-Ups/Clinician Recommendations: none  This is a list of the screening recommended for you and due dates:  Health Maintenance  Topic Date Due   Zoster (Shingles) Vaccine (1 of 2) Never done   DEXA scan (bone density measurement)  Never done   Eye exam for diabetics  12/01/2021   Complete foot exam   04/01/2022   DTaP/Tdap/Td vaccine (2 - Tdap) 07/16/2022   Flu Shot  03/09/2023   COVID-19 Vaccine (4 - 2023-24 season) 04/09/2023   Hemoglobin A1C  08/02/2023   Medicare Annual Wellness Visit  06/07/2024   Pneumonia Vaccine  Completed   HPV Vaccine  Aged Out    Advanced directives: (Declined) Advance directive discussed with you today. Even though you declined this today, please call our office should you change your mind, and we can give you the proper paperwork for you to fill out.  Next Medicare Annual Wellness Visit scheduled for next year: No. Office will schedule appointment  Insert Preventive Care attachment Insert FALL PREVENTION attachment if needed

## 2023-06-08 NOTE — Progress Notes (Signed)
I,Victoria T Deloria Lair, CMA,acting as a Neurosurgeon for Gwynneth Aliment, MD.,have documented all relevant documentation on the behalf of Gwynneth Aliment, MD,as directed by  Gwynneth Aliment, MD while in the presence of Gwynneth Aliment, MD.  Subjective:  Patient ID: [name] [name] , female    DOB: 1936-05-18 , 87 y.o.   MRN: [id number]  Chief Complaint  Patient presents with   Diabetes   Hypertension    HPI  Patient is here for DM and HTN follow up. She is accompanied by her daughter, [name] today.  At this time, she denies headaches, chest pain and shortness of breath. Her daughter adds that the patient has been more compliant with dialysis sessions   AWV completed with [facility] Advisor, Herbie Saxon.   Diabetes She presents for her follow-up diabetic visit. She has type 2 diabetes mellitus. Her disease course has been stable. There are no hypoglycemic associated symptoms. There are no diabetic associated symptoms. Pertinent negatives for diabetes include no blurred vision and no chest pain. There are no hypoglycemic complications. Symptoms are stable. There are no diabetic complications. Risk factors for coronary artery disease include diabetes mellitus, obesity and sedentary lifestyle. Current diabetic treatment includes oral agent (dual therapy) and oral agent (monotherapy). She is compliant with treatment all of the time. She is currently taking insulin at bedtime. Insulin injections are given by patient. Rotation sites for injection include the abdominal wall. Her weight is decreasing steadily. She is following a generally healthy diet. When asked about meal planning, she reported none. She participates in exercise every other day. She does not see a podiatrist.Eye exam is current.  Hypertension This is a chronic problem. The current episode started more than 1 year ago. The problem is unchanged. The problem is uncontrolled. Pertinent negatives include no anxiety, blurred vision or chest pain. There  are no associated agents to hypertension. Risk factors for coronary artery disease include obesity and diabetes mellitus. There are no compliance problems.      Past Medical History:  Diagnosis Date   Arthritis    RA   CHF (congestive heart failure) (HCC)    CKD (chronic kidney disease) stage 3, GFR 30-59 ml/min (HCC)    M-W-F dialysis   Dementia (HCC)    Diabetes mellitus without complication (HCC)    insulin dependent   Hyperlipidemia    Hypertension    Seasonal allergies    Shortness of breath dyspnea    occasional with exertion - no oxygen   Sleep apnea    does not use cpap   TIA (transient ischemic attack) 2015     Family History  Problem Relation Age of Onset   Diabetes type II Mother    Hypertension Mother    Diabetes type II Father    Hypertension Other    Hyperlipidemia Other    Stroke Other      Current Outpatient Medications:    amLODipine (NORVASC) 10 MG tablet, TAKE 1 TABLET(10 MG) BY MOUTH DAILY, Disp: 90 tablet, Rfl: 2   apixaban (ELIQUIS) 2.5 MG TABS tablet, TAKE 1 TABLET(2.5 MG) BY MOUTH TWICE DAILY, Disp: 180 tablet, Rfl: 1   ascorbic acid (VITAMIN C) 500 MG tablet, Take 1 tablet (500 mg total) by mouth daily., Disp: , Rfl:    atorvastatin (LIPITOR) 20 MG tablet, TAKE 1 TABLET(20 MG) BY MOUTH DAILY AT 6 PM FOR CHOLESTEROL Strength: 20 mg, Disp: 90 tablet, Rfl: 1   blood glucose meter kit and supplies KIT, Dispense based on  patient and insurance preference. Use up to four times daily as directed. (FOR ICD-9 250.00, 250.01)., Disp: 1 each, Rfl: 0   Blood Pressure Monitor KIT, Use as directed to check blood pressure as needed, Disp: 1 kit, Rfl: 1   cetirizine (ZYRTEC) 10 MG tablet, Take 10 mg by mouth daily., Disp: , Rfl:    donepezil (ARICEPT) 5 MG tablet, TAKE 1 TABLET(5 MG) BY MOUTH AT BEDTIME, Disp: 90 tablet, Rfl: 2   glucose blood test strip, Use as directed to check blood sugars 3 times per day dx: e11.65, Disp: 100 each, Rfl: 12   lanthanum  (FOSRENOL) 1000 MG chewable tablet, Chew 1,000 mg by mouth 3 (three) times daily with meals. Takes twice daily, Disp: , Rfl:    lidocaine (LMX) 4 % cream, SMARTSIG:Sparingly Topical As Directed, Disp: , Rfl:    loratadine (CLARITIN) 10 MG tablet, Take 10 mg by mouth daily., Disp: , Rfl:    metoprolol tartrate (LOPRESSOR) 25 MG tablet, TAKE 1/2 TABLET(12.5 MG) BY MOUTH TWICE DAILY, Disp: 90 tablet, Rfl: 2   polyethylene glycol (MIRALAX) 17 g packet, Take 17 g by mouth daily as needed., Disp: 14 each, Rfl: 0   sevelamer (RENAGEL) 800 MG tablet, Take by mouth 3 (three) times daily with meals., Disp: , Rfl:    zinc sulfate 220 (50 Zn) MG capsule, Take 1 capsule (220 mg total) by mouth daily., Disp: , Rfl:    Allergies  Allergen Reactions   Penicillins Swelling and Rash     Review of Systems  Constitutional: Negative.   Eyes:  Negative for blurred vision.  Respiratory: Negative.    Cardiovascular: Negative.  Negative for chest pain.  Gastrointestinal: Negative.   Neurological: Negative.   Psychiatric/Behavioral: Negative.       Today's Vitals   06/08/23 1515 06/08/23 1540  BP: (!) 170/80 (!) 158/90  Pulse: 82   Temp: 98.1 F (36.7 C)   SpO2: 93%   Weight: 141 lb (64 kg)   Height: 4\' 11"  (1.499 m)    Body mass index is 28.48 kg/m.  Wt Readings from Last 3 Encounters:  06/08/23 141 lb (64 kg)  06/08/23 141 lb (64 kg)  04/20/23 143 lb (64.9 kg)     Objective:  Physical Exam Vitals and nursing note reviewed.  Constitutional:      Appearance: Normal appearance.  HENT:     Head: Normocephalic and atraumatic.  Eyes:     Extraocular Movements: Extraocular movements intact.  Cardiovascular:     Rate and Rhythm: Normal rate and regular rhythm.     Heart sounds: Normal heart sounds.     Comments: Fistula LUE Pulmonary:     Effort: Pulmonary effort is normal.     Breath sounds: Normal breath sounds.  Musculoskeletal:     Cervical back: Normal range of motion.  Skin:     General: Skin is warm.  Neurological:     General: No focal deficit present.     Mental Status: She is alert.  Psychiatric:        Mood and Affect: Mood normal.        Behavior: Behavior normal.         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 She is not taking any DM meds at this time. Now controlled with diet.  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 She will continue with amlodipine 10mg  and metoprolol 25mg  1/2 tab twice daily. reminded to follow a low sodium diet. She admits to eating liver pudding daily. Encouraged to decrease her intake of processed meats including bacon, sausages, deli meats and liver pudding.    Chronic systolic congestive heart failure (HCC) Assessment & Plan: Chronic, importance of dietary compliance was discussed with the patient. Encouraged to follow low sodium diet.  She will continue with metoprolol 25mg  1/2 tab twice daily. Appears euvolemic today.    Paroxysmal atrial fibrillation (HCC) Assessment & Plan: Chronic, currently rate controlled and properly anticoagulated.    Acquired thrombophilia (HCC) Assessment & Plan: Chronic, currently on Eliquis 2.5mg  twice daily due to underlying PAF.    MGUS (monoclonal gammopathy of unknown significance) Assessment & Plan: Chronic, BM biopsy has not been performed. Importance of regular Hematology f/u was discussed with both patient and her daughter.    Estrogen deficiency Assessment & Plan: Bone density ordered in Feb, not yet done. I will check with referral coordinator, hopefully she will be able to get this scheduled before 08/08/23.    Encounter for screening mammogram for malignant neoplasm of breast -     Digital Screening Mammogram, Left and Right;  Future  Dependence on renal dialysis (HCC)     Return in about 4 weeks (around 07/06/2023), or BP check NV, for 4 month DM.  Patient was given opportunity to ask questions. Patient verbalized understanding of the plan and was able to repeat key elements of the plan. All questions were answered to their satisfaction.    I, Gwynneth Aliment, MD, have reviewed all documentation for this visit. The documentation on 06/18/23 for the exam, diagnosis, procedures, and orders are all accurate and complete.   IF YOU HAVE BEEN REFERRED TO A SPECIALIST, IT MAY TAKE 1-2 WEEKS TO SCHEDULE/PROCESS THE REFERRAL. IF YOU HAVE NOT HEARD FROM US/SPECIALIST IN TWO WEEKS, PLEASE GIVE [place] A CALL AT [phone].   THE PATIENT IS ENCOURAGED TO PRACTICE SOCIAL DISTANCING DUE TO THE COVID-19 PANDEMIC.

## 2023-06-08 NOTE — Patient Instructions (Signed)

## 2023-06-08 NOTE — Progress Notes (Signed)
Subjective:   [name] [name] is a 87 y.o. female who presents for Medicare Annual (Subsequent) preventive examination.  Visit Complete: In person    Cardiac Risk Factors include: advanced age (>45men, >42 women);diabetes mellitus;dyslipidemia;hypertension     Objective:    Today's Vitals   06/08/23 1458 06/08/23 1519  BP: (!) 170/80 (!) 170/80  Pulse: 82   Temp: 98.1 F (36.7 C)   TempSrc: Oral   SpO2: 93%   Weight: 141 lb (64 kg)   Height: 4\' 11"  (1.499 m)    Body mass index is 28.48 kg/m.     06/08/2023    3:08 PM 05/19/2022    4:11 PM 12/18/2021   12:04 PM 08/20/2021   10:45 PM 08/20/2021    5:48 PM 07/22/2021   12:00 PM 05/13/2021    2:44 PM  Advanced Directives  Does Patient Have a Medical Advance Directive? No Yes No Yes Yes No No  Type of Archivist Living will;Healthcare Power of Attorney    Copy of Healthcare Power of Attorney in Chart?  No - copy requested       Would patient like information on creating a medical advance directive? No - Patient declined     No - Patient declined     Current Medications (verified) Outpatient Encounter Medications as of 06/08/2023  Medication Sig   amLODipine (NORVASC) 10 MG tablet TAKE 1 TABLET(10 MG) BY MOUTH DAILY   apixaban (ELIQUIS) 2.5 MG TABS tablet TAKE 1 TABLET(2.5 MG) BY MOUTH TWICE DAILY   ascorbic acid (VITAMIN C) 500 MG tablet Take 1 tablet (500 mg total) by mouth daily.   atorvastatin (LIPITOR) 20 MG tablet TAKE 1 TABLET(20 MG) BY MOUTH DAILY AT 6 PM FOR CHOLESTEROL Strength: 20 mg   blood glucose meter kit and supplies KIT Dispense based on patient and insurance preference. Use up to four times daily as directed. (FOR ICD-9 250.00, 250.01).   Blood Pressure Monitor KIT Use as directed to check blood pressure as needed   cetirizine (ZYRTEC) 10 MG tablet Take 10 mg by mouth daily.   donepezil (ARICEPT) 5 MG tablet TAKE 1 TABLET(5 MG)  BY MOUTH AT BEDTIME   glucose blood test strip Use as directed to check blood sugars 3 times per day dx: e11.65   lanthanum (FOSRENOL) 1000 MG chewable tablet Chew 1,000 mg by mouth 3 (three) times daily with meals. Takes twice daily   lidocaine (LMX) 4 % cream SMARTSIG:Sparingly Topical As Directed   loratadine (CLARITIN) 10 MG tablet Take 10 mg by mouth daily.   metoprolol tartrate (LOPRESSOR) 25 MG tablet TAKE 1/2 TABLET(12.5 MG) BY MOUTH TWICE DAILY   polyethylene glycol (MIRALAX) 17 g packet Take 17 g by mouth daily as needed.   sevelamer (RENAGEL) 800 MG tablet Take by mouth 3 (three) times daily with meals.   zinc sulfate 220 (50 Zn) MG capsule Take 1 capsule (220 mg total) by mouth daily.   No facility-administered encounter medications on file as of 06/08/2023.    Allergies (verified) Penicillins   History: Past Medical History:  Diagnosis Date   Arthritis    RA   CHF (congestive heart failure) (HCC)    CKD (chronic kidney disease) stage 3, GFR 30-59 ml/min (HCC)    M-W-F dialysis   Dementia (HCC)    Diabetes mellitus without complication (HCC)    insulin dependent   Hyperlipidemia    Hypertension  Seasonal allergies    Shortness of breath dyspnea    occasional with exertion - no oxygen   Sleep apnea    does not use cpap   TIA (transient ischemic attack) 2015   Past Surgical History:  Procedure Laterality Date   ABDOMINAL HYSTERECTOMY  1974   AV FISTULA PLACEMENT Left 12/10/2020   Procedure: INSERTION OF ARTERIOVENOUS (AV) GORE-TEX GRAFT LEFT ARM;  Surgeon: Nada Libman, MD;  Location: [facility] OR;  Service: Vascular;  Laterality: Left;   COLONOSCOPY     EYE SURGERY Bilateral    cataracts removed   IR FLUORO GUIDE CV LINE RIGHT  07/29/2020   IR FLUORO GUIDE CV LINE RIGHT  08/04/2020   IR US GUIDE VASC ACCESS RIGHT  07/29/2020   MULTIPLE TOOTH EXTRACTIONS     TOTAL HIP ARTHROPLASTY Right 07/23/2021   Procedure: TOTAL HIP ARTHROPLASTY ANTERIOR APPROACH;  Surgeon:  Samson Frederic, MD;  Location: [facility] OR;  Service: Orthopedics;  Laterality: Right;   Family History  Problem Relation Age of Onset   Diabetes type II Mother    Hypertension Mother    Diabetes type II Father    Hypertension Other    Hyperlipidemia Other    Stroke Other    Social History   Socioeconomic History   Marital status: Widowed    Spouse name: Not on file   Number of children: 6   Years of education: 12th   Highest education level: Not on file  Occupational History   Occupation: retired    Associate Professor: RETIRED  Tobacco Use   Smoking status: Never   Smokeless tobacco: Never  Vaping Use   Vaping status: Never Used  Substance and Sexual Activity   Alcohol use: No    Alcohol/week: 0.0 standard drinks of alcohol   Drug use: No   Sexual activity: Not Currently    Birth control/protection: Surgical    Comment: Hysterectomy  Other Topics Concern   Not on file  Social History Narrative   Patient lives with her husband    Patient is right handed   Patient drinks tea   Social Determinants of Health   Financial Resource Strain: Low Risk  (06/08/2023)   Overall Financial Resource Strain (CARDIA)    Difficulty of Paying Living Expenses: Not hard at all  Food Insecurity: No Food Insecurity (06/08/2023)   Hunger Vital Sign    Worried About Running Out of Food in the Last Year: Never true    Ran Out of Food in the Last Year: Never true  Transportation Needs: No Transportation Needs (06/08/2023)   PRAPARE - Administrator, Civil Service (Medical): No    Lack of Transportation (Non-Medical): No  Physical Activity: Inactive (06/08/2023)   Exercise Vital Sign    Days of Exercise per Week: 0 days    Minutes of Exercise per Session: 0 min  Stress: No Stress Concern Present (06/08/2023)   [employer] of Occupational Health - Occupational Stress Questionnaire    Feeling of Stress : Not at all  Social Connections: Socially Isolated (06/08/2023)   Social  Connection and Isolation Panel [NHANES]    Frequency of Communication with Friends and Family: Never    Frequency of Social Gatherings with Friends and Family: More than three times a week    Attends Religious Services: Never    Database administrator or Organizations: No    Attends Banker Meetings: Never    Marital Status: Widowed    Tobacco Counseling  Counseling given: Not Answered   Clinical Intake:  Pre-visit preparation completed: Yes  Pain : No/denies pain     Nutritional Status: BMI 25 -29 Overweight Nutritional Risks: None Diabetes: Yes CBG done?: No Did pt. bring in CBG monitor from home?: No  How often do you need to have someone help you when you read instructions, pamphlets, or other written materials from your doctor or pharmacy?: 1 - Never  Interpreter Needed?: No  Information entered by :: NAllen LPN   Activities of Daily Living    06/08/2023    2:59 PM  In your present state of health, do you have any difficulty performing the following activities:  Hearing? 0  Vision? 0  Difficulty concentrating or making decisions? 1  Walking or climbing stairs? 0  Dressing or bathing? 0  Doing errands, shopping? 1  Comment does not drive  Preparing Food and eating ? N  Using the Toilet? N  In the past six months, have you accidently leaked urine? N  Do you have problems with loss of bowel control? N  Managing your Medications? N  Managing your Finances? N  Housekeeping or managing your Housekeeping? N    Patient Care Team: Dorothyann Peng, MD as PCP - General (Internal Medicine) Ginette Otto, [facility]  Indicate any recent Medical Services you may have received from other than [facility] providers in the past year (date may be approximate).     Assessment:   This is a routine wellness examination for [name] [name].  Hearing/Vision screen Hearing Screening - Comments:: Denies hearing issues Vision Screening - Comments:: Regular  eye exams,    Goals Addressed             This Visit's Progress    Patient Stated       06/08/2023, denies goals       Depression Screen    06/08/2023    3:11 PM 01/31/2023   12:10 PM 05/19/2022    4:14 PM 05/13/2021    2:45 PM 04/01/2021    2:22 PM 01/30/2020    2:24 PM 01/24/2019    3:01 PM  PHQ 2/9 Scores  PHQ - 2 Score 0 0 0 0 0 0 0  PHQ- 9 Score 0    0 0     Fall Risk    06/08/2023    3:09 PM 01/31/2023   12:10 PM 05/19/2022    4:13 PM 05/13/2021    2:45 PM 04/01/2021    2:21 PM  Fall Risk   Falls in the past year? 0 0 1 0 0  Comment   not really sure    Number falls in past yr: 0 0 0  0  Injury with Fall? 0 0 1  0  Comment   broke hip    Risk for fall due to : Medication side effect No Fall Risks Impaired balance/gait;Medication side effect Medication side effect   Follow up Falls prevention discussed;Falls evaluation completed Falls evaluation completed Falls evaluation completed;Education provided;Falls prevention discussed Falls evaluation completed;Education provided;Falls prevention discussed     MEDICARE RISK AT HOME: Medicare Risk at Home Any stairs in or around the home?: No If so, are there any without handrails?: No Home free of loose throw rugs in walkways, pet beds, electrical cords, etc?: Yes Adequate lighting in your home to reduce risk of falls?: Yes Life alert?: No Use of a cane, walker or w/c?: No Grab bars in the bathroom?: Yes Shower chair or bench in shower?:  Yes Elevated toilet seat or a handicapped toilet?: No  TIMED UP AND GO:  Was the test performed?  Yes  Length of time to ambulate 10 feet: 5 sec Gait slow and steady without use of assistive device    Cognitive Function:  6 CIT not administered. Patient has diagnosis of dementia.        09/24/2020   11:42 AM 01/30/2020    2:27 PM 01/24/2019    3:04 PM  6CIT Screen  What Year? 4 points 0 points 0 points  What month? 3 points 0 points 0 points  What time? 3 points 0  points 0 points  Count back from 20 4 points 0 points 0 points  Months in reverse 4 points 4 points 0 points  Repeat phrase 0 points 6 points 0 points  Total Score 18 points 10 points 0 points    Immunizations Immunization History  Administered Date(s) Administered   DTaP 07/16/2012   Fluad Quad(high Dose 65+) 09/08/2022   Fluad Trivalent(High Dose 65+) 06/08/2023   Influenza, High Dose Seasonal PF 05/12/2021   Moderna Sars-Covid-2 Vaccination 08/28/2019, 09/25/2019, 06/22/2020   Pneumococcal Conjugate-13 12/26/2017   Pneumococcal Polysaccharide-23 03/01/2022   Pneumococcal-Unspecified 07/16/2012    TDAP status: Due, Education has been provided regarding the importance of this vaccine. Advised may receive this vaccine at local pharmacy or Health Dept. Aware to provide a copy of the vaccination record if obtained from local pharmacy or Health Dept. Verbalized acceptance and understanding.  Flu Vaccine status: Completed at today's visit  Pneumococcal vaccine status: Up to date  Covid-19 vaccine status: Information provided on how to obtain vaccines.   Qualifies for Shingles Vaccine? Yes   Zostavax completed No   Shingrix Completed?: No.    Education has been provided regarding the importance of this vaccine. Patient has been advised to call insurance company to determine out of pocket expense if they have not yet received this vaccine. Advised may also receive vaccine at local pharmacy or Health Dept. Verbalized acceptance and understanding.  Screening Tests Health Maintenance  Topic Date Due   Zoster Vaccines- Shingrix (1 of 2) Never done   DEXA SCAN  Never done   OPHTHALMOLOGY EXAM  12/01/2021   FOOT EXAM  04/01/2022   DTaP/Tdap/Td (2 - Tdap) 07/16/2022   COVID-19 Vaccine (4 - 2023-24 season) 04/09/2023   HEMOGLOBIN A1C  08/02/2023   Medicare Annual Wellness (AWV)  06/07/2024   Pneumonia Vaccine 64+ Years old  Completed   INFLUENZA VACCINE  Completed   HPV VACCINES  Aged  Out    Health Maintenance  Health Maintenance Due  Topic Date Due   Zoster Vaccines- Shingrix (1 of 2) Never done   DEXA SCAN  Never done   OPHTHALMOLOGY EXAM  12/01/2021   FOOT EXAM  04/01/2022   DTaP/Tdap/Td (2 - Tdap) 07/16/2022   COVID-19 Vaccine (4 - 2023-24 season) 04/09/2023    Colorectal cancer screening: No longer required.   Mammogram status: No longer required due to age.  Bone Density status: Ordered 09/08/2022. Pt provided with contact info and advised to call to schedule appt.  Lung Cancer Screening: (Low Dose CT Chest recommended if Age 35-80 years, 20 pack-year currently smoking OR have quit w/in 15years.) does not qualify.   Lung Cancer Screening Referral: no  Additional Screening:  Hepatitis C Screening: does not qualify;   Vision Screening: Recommended annual ophthalmology exams for early detection of glaucoma and other disorders of the eye. Is the patient up to  date with their annual eye exam?  No  Who is the provider or what is the name of the office in which the patient attends annual eye exams? Can't remember name If pt is not established with a provider, would they like to be referred to a provider to establish care? No .   Dental Screening: Recommended annual dental exams for proper oral hygiene  Diabetic Foot Exam: Diabetic Foot Exam: Overdue, Pt has been advised about the importance in completing this exam. Pt is scheduled for diabetic foot exam on next appointment.  Community Resource Referral / Chronic Care Management: CRR required this visit?  No   CCM required this visit?  No     Plan:     I have personally reviewed and noted the following in the patient's chart:   Medical and social history Use of alcohol, tobacco or illicit drugs  Current medications and supplements including opioid prescriptions. Patient is not currently taking opioid prescriptions. Functional ability and status Nutritional status Physical activity Advanced  directives List of other physicians Hospitalizations, surgeries, and ER visits in previous 12 months Vitals Screenings to include cognitive, depression, and falls Referrals and appointments  In addition, I have reviewed and discussed with patient certain preventive protocols, quality metrics, and best practice recommendations. A written personalized care plan for preventive services as well as general preventive health recommendations were provided to patient.     Barb Merino, LPN   [id number]/05/[id number]   After Visit Summary: (In Person-Printed) AVS printed and given to the patient  Nurse Notes: none

## 2023-06-09 DIAGNOSIS — Z992 Dependence on renal dialysis: Secondary | ICD-10-CM | POA: Diagnosis not present

## 2023-06-09 DIAGNOSIS — N186 End stage renal disease: Secondary | ICD-10-CM | POA: Diagnosis not present

## 2023-06-09 DIAGNOSIS — E1110 Type 2 diabetes mellitus with ketoacidosis without coma: Secondary | ICD-10-CM | POA: Diagnosis not present

## 2023-06-09 DIAGNOSIS — N2581 Secondary hyperparathyroidism of renal origin: Secondary | ICD-10-CM | POA: Diagnosis not present

## 2023-06-09 DIAGNOSIS — R52 Pain, unspecified: Secondary | ICD-10-CM | POA: Diagnosis not present

## 2023-06-09 DIAGNOSIS — E1129 Type 2 diabetes mellitus with other diabetic kidney complication: Secondary | ICD-10-CM | POA: Diagnosis not present

## 2023-06-09 DIAGNOSIS — E119 Type 2 diabetes mellitus without complications: Secondary | ICD-10-CM | POA: Diagnosis not present

## 2023-06-09 DIAGNOSIS — D631 Anemia in chronic kidney disease: Secondary | ICD-10-CM | POA: Diagnosis not present

## 2023-06-09 LAB — HEMOGLOBIN A1C
Est. average glucose Bld gHb Est-mCnc: 123 mg/dL
Hgb A1c MFr Bld: 5.9 % — ABNORMAL HIGH (ref 4.8–5.6)

## 2023-06-12 DIAGNOSIS — E119 Type 2 diabetes mellitus without complications: Secondary | ICD-10-CM | POA: Diagnosis not present

## 2023-06-12 DIAGNOSIS — Z992 Dependence on renal dialysis: Secondary | ICD-10-CM | POA: Diagnosis not present

## 2023-06-12 DIAGNOSIS — N186 End stage renal disease: Secondary | ICD-10-CM | POA: Diagnosis not present

## 2023-06-12 DIAGNOSIS — D631 Anemia in chronic kidney disease: Secondary | ICD-10-CM | POA: Diagnosis not present

## 2023-06-12 DIAGNOSIS — N2581 Secondary hyperparathyroidism of renal origin: Secondary | ICD-10-CM | POA: Diagnosis not present

## 2023-06-12 DIAGNOSIS — E1129 Type 2 diabetes mellitus with other diabetic kidney complication: Secondary | ICD-10-CM | POA: Diagnosis not present

## 2023-06-12 DIAGNOSIS — R52 Pain, unspecified: Secondary | ICD-10-CM | POA: Diagnosis not present

## 2023-06-12 DIAGNOSIS — E1110 Type 2 diabetes mellitus with ketoacidosis without coma: Secondary | ICD-10-CM | POA: Diagnosis not present

## 2023-06-14 DIAGNOSIS — E1129 Type 2 diabetes mellitus with other diabetic kidney complication: Secondary | ICD-10-CM | POA: Diagnosis not present

## 2023-06-14 DIAGNOSIS — E119 Type 2 diabetes mellitus without complications: Secondary | ICD-10-CM | POA: Diagnosis not present

## 2023-06-14 DIAGNOSIS — D631 Anemia in chronic kidney disease: Secondary | ICD-10-CM | POA: Diagnosis not present

## 2023-06-14 DIAGNOSIS — Z992 Dependence on renal dialysis: Secondary | ICD-10-CM | POA: Diagnosis not present

## 2023-06-14 DIAGNOSIS — E1110 Type 2 diabetes mellitus with ketoacidosis without coma: Secondary | ICD-10-CM | POA: Diagnosis not present

## 2023-06-14 DIAGNOSIS — N186 End stage renal disease: Secondary | ICD-10-CM | POA: Diagnosis not present

## 2023-06-14 DIAGNOSIS — R52 Pain, unspecified: Secondary | ICD-10-CM | POA: Diagnosis not present

## 2023-06-14 DIAGNOSIS — N2581 Secondary hyperparathyroidism of renal origin: Secondary | ICD-10-CM | POA: Diagnosis not present

## 2023-06-16 DIAGNOSIS — D631 Anemia in chronic kidney disease: Secondary | ICD-10-CM | POA: Diagnosis not present

## 2023-06-16 DIAGNOSIS — Z992 Dependence on renal dialysis: Secondary | ICD-10-CM | POA: Diagnosis not present

## 2023-06-16 DIAGNOSIS — R52 Pain, unspecified: Secondary | ICD-10-CM | POA: Diagnosis not present

## 2023-06-16 DIAGNOSIS — E119 Type 2 diabetes mellitus without complications: Secondary | ICD-10-CM | POA: Diagnosis not present

## 2023-06-16 DIAGNOSIS — E1129 Type 2 diabetes mellitus with other diabetic kidney complication: Secondary | ICD-10-CM | POA: Diagnosis not present

## 2023-06-16 DIAGNOSIS — E1110 Type 2 diabetes mellitus with ketoacidosis without coma: Secondary | ICD-10-CM | POA: Diagnosis not present

## 2023-06-16 DIAGNOSIS — N2581 Secondary hyperparathyroidism of renal origin: Secondary | ICD-10-CM | POA: Diagnosis not present

## 2023-06-16 DIAGNOSIS — N186 End stage renal disease: Secondary | ICD-10-CM | POA: Diagnosis not present

## 2023-06-18 DIAGNOSIS — I480 Paroxysmal atrial fibrillation: Secondary | ICD-10-CM | POA: Insufficient documentation

## 2023-06-18 DIAGNOSIS — E2839 Other primary ovarian failure: Secondary | ICD-10-CM | POA: Insufficient documentation

## 2023-06-18 DIAGNOSIS — D6869 Other thrombophilia: Secondary | ICD-10-CM | POA: Insufficient documentation

## 2023-06-18 NOTE — Assessment & Plan Note (Signed)
Bone density ordered in Feb, not yet done. I will check with referral coordinator, hopefully she will be able to get this scheduled before 08/08/23.

## 2023-06-18 NOTE — Assessment & Plan Note (Signed)
Chronic, importance of dietary compliance was discussed with the patient. Encouraged to follow low sodium diet.  She will continue with metoprolol 25mg  1/2 tab twice daily. Appears euvolemic today.

## 2023-06-18 NOTE — Assessment & Plan Note (Signed)
Chronic, currently on HD. She is not taking any DM meds at this time. Now controlled with diet.

## 2023-06-18 NOTE — Assessment & Plan Note (Signed)
Chronic, BM biopsy has not been performed. Importance of regular Hematology f/u was discussed with both patient and her daughter.

## 2023-06-18 NOTE — Assessment & Plan Note (Signed)
Chronic, currently rate controlled and properly anticoagulated.

## 2023-06-18 NOTE — Assessment & Plan Note (Signed)
Chronic, currently on Eliquis 2.5mg  twice daily due to underlying PAF.

## 2023-06-18 NOTE — Assessment & Plan Note (Signed)
Chronic, uncontrolled. She will continue with amlodipine 10mg  and metoprolol 25mg  1/2 tab twice daily. reminded to follow a low sodium diet. She admits to eating liver pudding daily. Encouraged to decrease her intake of processed meats including bacon, sausages, deli meats and liver pudding.

## 2023-06-19 DIAGNOSIS — E119 Type 2 diabetes mellitus without complications: Secondary | ICD-10-CM | POA: Diagnosis not present

## 2023-06-19 DIAGNOSIS — E1110 Type 2 diabetes mellitus with ketoacidosis without coma: Secondary | ICD-10-CM | POA: Diagnosis not present

## 2023-06-19 DIAGNOSIS — N186 End stage renal disease: Secondary | ICD-10-CM | POA: Diagnosis not present

## 2023-06-19 DIAGNOSIS — R52 Pain, unspecified: Secondary | ICD-10-CM | POA: Diagnosis not present

## 2023-06-19 DIAGNOSIS — E1129 Type 2 diabetes mellitus with other diabetic kidney complication: Secondary | ICD-10-CM | POA: Diagnosis not present

## 2023-06-19 DIAGNOSIS — Z992 Dependence on renal dialysis: Secondary | ICD-10-CM | POA: Diagnosis not present

## 2023-06-19 DIAGNOSIS — N2581 Secondary hyperparathyroidism of renal origin: Secondary | ICD-10-CM | POA: Diagnosis not present

## 2023-06-19 DIAGNOSIS — D631 Anemia in chronic kidney disease: Secondary | ICD-10-CM | POA: Diagnosis not present

## 2023-06-21 DIAGNOSIS — E119 Type 2 diabetes mellitus without complications: Secondary | ICD-10-CM | POA: Diagnosis not present

## 2023-06-21 DIAGNOSIS — E1110 Type 2 diabetes mellitus with ketoacidosis without coma: Secondary | ICD-10-CM | POA: Diagnosis not present

## 2023-06-21 DIAGNOSIS — N186 End stage renal disease: Secondary | ICD-10-CM | POA: Diagnosis not present

## 2023-06-21 DIAGNOSIS — N2581 Secondary hyperparathyroidism of renal origin: Secondary | ICD-10-CM | POA: Diagnosis not present

## 2023-06-21 DIAGNOSIS — E1129 Type 2 diabetes mellitus with other diabetic kidney complication: Secondary | ICD-10-CM | POA: Diagnosis not present

## 2023-06-21 DIAGNOSIS — Z992 Dependence on renal dialysis: Secondary | ICD-10-CM | POA: Diagnosis not present

## 2023-06-21 DIAGNOSIS — D631 Anemia in chronic kidney disease: Secondary | ICD-10-CM | POA: Diagnosis not present

## 2023-06-21 DIAGNOSIS — R52 Pain, unspecified: Secondary | ICD-10-CM | POA: Diagnosis not present

## 2023-06-23 DIAGNOSIS — Z992 Dependence on renal dialysis: Secondary | ICD-10-CM | POA: Diagnosis not present

## 2023-06-23 DIAGNOSIS — D631 Anemia in chronic kidney disease: Secondary | ICD-10-CM | POA: Diagnosis not present

## 2023-06-23 DIAGNOSIS — N186 End stage renal disease: Secondary | ICD-10-CM | POA: Diagnosis not present

## 2023-06-23 DIAGNOSIS — E1110 Type 2 diabetes mellitus with ketoacidosis without coma: Secondary | ICD-10-CM | POA: Diagnosis not present

## 2023-06-23 DIAGNOSIS — E1129 Type 2 diabetes mellitus with other diabetic kidney complication: Secondary | ICD-10-CM | POA: Diagnosis not present

## 2023-06-23 DIAGNOSIS — E119 Type 2 diabetes mellitus without complications: Secondary | ICD-10-CM | POA: Diagnosis not present

## 2023-06-23 DIAGNOSIS — N2581 Secondary hyperparathyroidism of renal origin: Secondary | ICD-10-CM | POA: Diagnosis not present

## 2023-06-23 DIAGNOSIS — R52 Pain, unspecified: Secondary | ICD-10-CM | POA: Diagnosis not present

## 2023-06-26 ENCOUNTER — Other Ambulatory Visit: Payer: Self-pay

## 2023-06-26 ENCOUNTER — Inpatient Hospital Stay: Payer: 59 | Attending: Hematology and Oncology

## 2023-06-26 DIAGNOSIS — N2581 Secondary hyperparathyroidism of renal origin: Secondary | ICD-10-CM | POA: Diagnosis not present

## 2023-06-26 DIAGNOSIS — E1129 Type 2 diabetes mellitus with other diabetic kidney complication: Secondary | ICD-10-CM | POA: Diagnosis not present

## 2023-06-26 DIAGNOSIS — E1110 Type 2 diabetes mellitus with ketoacidosis without coma: Secondary | ICD-10-CM | POA: Diagnosis not present

## 2023-06-26 DIAGNOSIS — D631 Anemia in chronic kidney disease: Secondary | ICD-10-CM | POA: Diagnosis not present

## 2023-06-26 DIAGNOSIS — N186 End stage renal disease: Secondary | ICD-10-CM | POA: Diagnosis not present

## 2023-06-26 DIAGNOSIS — D472 Monoclonal gammopathy: Secondary | ICD-10-CM

## 2023-06-26 DIAGNOSIS — R52 Pain, unspecified: Secondary | ICD-10-CM | POA: Diagnosis not present

## 2023-06-26 DIAGNOSIS — Z992 Dependence on renal dialysis: Secondary | ICD-10-CM | POA: Diagnosis not present

## 2023-06-26 DIAGNOSIS — E119 Type 2 diabetes mellitus without complications: Secondary | ICD-10-CM | POA: Diagnosis not present

## 2023-06-28 DIAGNOSIS — N2581 Secondary hyperparathyroidism of renal origin: Secondary | ICD-10-CM | POA: Diagnosis not present

## 2023-06-28 DIAGNOSIS — R52 Pain, unspecified: Secondary | ICD-10-CM | POA: Diagnosis not present

## 2023-06-28 DIAGNOSIS — E119 Type 2 diabetes mellitus without complications: Secondary | ICD-10-CM | POA: Diagnosis not present

## 2023-06-28 DIAGNOSIS — Z992 Dependence on renal dialysis: Secondary | ICD-10-CM | POA: Diagnosis not present

## 2023-06-28 DIAGNOSIS — D631 Anemia in chronic kidney disease: Secondary | ICD-10-CM | POA: Diagnosis not present

## 2023-06-28 DIAGNOSIS — N186 End stage renal disease: Secondary | ICD-10-CM | POA: Diagnosis not present

## 2023-06-28 DIAGNOSIS — E1129 Type 2 diabetes mellitus with other diabetic kidney complication: Secondary | ICD-10-CM | POA: Diagnosis not present

## 2023-06-28 DIAGNOSIS — E1110 Type 2 diabetes mellitus with ketoacidosis without coma: Secondary | ICD-10-CM | POA: Diagnosis not present

## 2023-06-30 DIAGNOSIS — E1129 Type 2 diabetes mellitus with other diabetic kidney complication: Secondary | ICD-10-CM | POA: Diagnosis not present

## 2023-06-30 DIAGNOSIS — E1110 Type 2 diabetes mellitus with ketoacidosis without coma: Secondary | ICD-10-CM | POA: Diagnosis not present

## 2023-06-30 DIAGNOSIS — D631 Anemia in chronic kidney disease: Secondary | ICD-10-CM | POA: Diagnosis not present

## 2023-06-30 DIAGNOSIS — N2581 Secondary hyperparathyroidism of renal origin: Secondary | ICD-10-CM | POA: Diagnosis not present

## 2023-06-30 DIAGNOSIS — R52 Pain, unspecified: Secondary | ICD-10-CM | POA: Diagnosis not present

## 2023-06-30 DIAGNOSIS — Z992 Dependence on renal dialysis: Secondary | ICD-10-CM | POA: Diagnosis not present

## 2023-06-30 DIAGNOSIS — N186 End stage renal disease: Secondary | ICD-10-CM | POA: Diagnosis not present

## 2023-06-30 DIAGNOSIS — E119 Type 2 diabetes mellitus without complications: Secondary | ICD-10-CM | POA: Diagnosis not present

## 2023-07-03 ENCOUNTER — Telehealth: Payer: Self-pay

## 2023-07-03 ENCOUNTER — Other Ambulatory Visit: Payer: Self-pay | Admitting: Hematology and Oncology

## 2023-07-03 ENCOUNTER — Inpatient Hospital Stay: Payer: 59 | Admitting: Hematology and Oncology

## 2023-07-03 DIAGNOSIS — D472 Monoclonal gammopathy: Secondary | ICD-10-CM

## 2023-07-03 NOTE — Telephone Encounter (Signed)
Called pt's daughter [name] and LVM advising we would need to r/s her MD telephone visit d/t no call no show for labs 06/26/23.  Called and spoke with daughter [name] who states patient lives with her sister, [name] and she is very difficult to get in touch with, and is noncompliant about her mother's appointments. Educated [place] on the importance of these visits and labs. She will try to get in touch with her sister so we can r/s pt. She will need labs anytime this week and a MD telephone visit one week later. Message sent to scheduling.

## 2023-07-03 NOTE — Assessment & Plan Note (Signed)
Lab review 03/01/2022: M spike 1.6 g, creatinine 5.33, hemoglobin 11.5, calcium 9.3, Kappa: 81, lambda 146, ratio 0.56 05/14/2022: Bone survey: No bone lytic lesions identified.  Degenerative changes.

## 2023-07-04 ENCOUNTER — Telehealth: Payer: Self-pay | Admitting: Hematology and Oncology

## 2023-07-04 DIAGNOSIS — E119 Type 2 diabetes mellitus without complications: Secondary | ICD-10-CM | POA: Diagnosis not present

## 2023-07-04 DIAGNOSIS — R52 Pain, unspecified: Secondary | ICD-10-CM | POA: Diagnosis not present

## 2023-07-04 DIAGNOSIS — N186 End stage renal disease: Secondary | ICD-10-CM | POA: Diagnosis not present

## 2023-07-04 DIAGNOSIS — E1110 Type 2 diabetes mellitus with ketoacidosis without coma: Secondary | ICD-10-CM | POA: Diagnosis not present

## 2023-07-04 DIAGNOSIS — E1129 Type 2 diabetes mellitus with other diabetic kidney complication: Secondary | ICD-10-CM | POA: Diagnosis not present

## 2023-07-04 DIAGNOSIS — Z992 Dependence on renal dialysis: Secondary | ICD-10-CM | POA: Diagnosis not present

## 2023-07-04 DIAGNOSIS — D631 Anemia in chronic kidney disease: Secondary | ICD-10-CM | POA: Diagnosis not present

## 2023-07-04 DIAGNOSIS — N2581 Secondary hyperparathyroidism of renal origin: Secondary | ICD-10-CM | POA: Diagnosis not present

## 2023-07-08 DIAGNOSIS — E1122 Type 2 diabetes mellitus with diabetic chronic kidney disease: Secondary | ICD-10-CM | POA: Diagnosis not present

## 2023-07-08 DIAGNOSIS — Z992 Dependence on renal dialysis: Secondary | ICD-10-CM | POA: Diagnosis not present

## 2023-07-08 DIAGNOSIS — N186 End stage renal disease: Secondary | ICD-10-CM | POA: Diagnosis not present

## 2023-07-11 ENCOUNTER — Inpatient Hospital Stay: Payer: 59 | Attending: Hematology and Oncology

## 2023-07-17 ENCOUNTER — Telehealth: Payer: Self-pay

## 2023-07-17 ENCOUNTER — Other Ambulatory Visit: Payer: Self-pay

## 2023-07-17 ENCOUNTER — Emergency Department (HOSPITAL_COMMUNITY)
Admission: EM | Admit: 2023-07-17 | Discharge: 2023-07-18 | Disposition: A | Discharge status: Home / Self Care | Payer: 59 | Attending: Emergency Medicine | Admitting: Emergency Medicine

## 2023-07-17 ENCOUNTER — Inpatient Hospital Stay: Payer: 59 | Admitting: Hematology and Oncology

## 2023-07-17 ENCOUNTER — Emergency Department (HOSPITAL_COMMUNITY): Payer: 59

## 2023-07-17 ENCOUNTER — Encounter (HOSPITAL_COMMUNITY): Payer: Self-pay | Admitting: Emergency Medicine

## 2023-07-17 DIAGNOSIS — Z7901 Long term (current) use of anticoagulants: Secondary | ICD-10-CM | POA: Insufficient documentation

## 2023-07-17 DIAGNOSIS — R0600 Dyspnea, unspecified: Secondary | ICD-10-CM | POA: Diagnosis not present

## 2023-07-17 DIAGNOSIS — N186 End stage renal disease: Secondary | ICD-10-CM | POA: Insufficient documentation

## 2023-07-17 DIAGNOSIS — I517 Cardiomegaly: Secondary | ICD-10-CM | POA: Diagnosis not present

## 2023-07-17 DIAGNOSIS — I10 Essential (primary) hypertension: Secondary | ICD-10-CM | POA: Diagnosis not present

## 2023-07-17 DIAGNOSIS — R5383 Other fatigue: Secondary | ICD-10-CM | POA: Insufficient documentation

## 2023-07-17 DIAGNOSIS — R601 Generalized edema: Secondary | ICD-10-CM | POA: Diagnosis not present

## 2023-07-17 DIAGNOSIS — E8770 Fluid overload, unspecified: Secondary | ICD-10-CM | POA: Diagnosis not present

## 2023-07-17 DIAGNOSIS — M7989 Other specified soft tissue disorders: Secondary | ICD-10-CM | POA: Diagnosis not present

## 2023-07-17 DIAGNOSIS — F0390 Unspecified dementia without behavioral disturbance: Secondary | ICD-10-CM | POA: Diagnosis not present

## 2023-07-17 DIAGNOSIS — R0989 Other specified symptoms and signs involving the circulatory and respiratory systems: Secondary | ICD-10-CM | POA: Diagnosis not present

## 2023-07-17 DIAGNOSIS — Z992 Dependence on renal dialysis: Secondary | ICD-10-CM | POA: Insufficient documentation

## 2023-07-17 LAB — BASIC METABOLIC PANEL
Anion gap: 10 (ref 5–15)
BUN: 70 mg/dL — ABNORMAL HIGH (ref 8–23)
CO2: 22 mmol/L (ref 22–32)
Calcium: 8.3 mg/dL — ABNORMAL LOW (ref 8.9–10.3)
Chloride: 102 mmol/L (ref 98–111)
Creatinine, Ser: 7.85 mg/dL — ABNORMAL HIGH (ref 0.44–1.00)
GFR, Estimated: 5 mL/min — ABNORMAL LOW (ref >60)
Glucose, Bld: 199 mg/dL — ABNORMAL HIGH (ref 70–99)
Potassium: 4.2 mmol/L (ref 3.5–5.1)
Sodium: 134 mmol/L — ABNORMAL LOW (ref 135–145)

## 2023-07-17 LAB — CBC
HCT: 30.6 % — ABNORMAL LOW (ref 36.0–46.0)
Hemoglobin: 9.7 g/dL — ABNORMAL LOW (ref 12.0–15.0)
MCH: 23.5 pg — ABNORMAL LOW (ref 26.0–34.0)
MCHC: 31.7 g/dL (ref 30.0–36.0)
MCV: 74.3 fL — ABNORMAL LOW (ref 80.0–100.0)
Platelets: 219 10*3/uL (ref 150–400)
RBC: 4.12 MIL/uL (ref 3.87–5.11)
RDW: 18.6 % — ABNORMAL HIGH (ref 11.5–15.5)
WBC: 4.8 10*3/uL (ref 4.0–10.5)
nRBC: 0 % (ref 0.0–0.2)

## 2023-07-17 LAB — TROPONIN I (HIGH SENSITIVITY): Troponin I (High Sensitivity): 19 ng/L — ABNORMAL HIGH (ref <18)

## 2023-07-17 NOTE — ED Provider Triage Note (Addendum)
Emergency Medicine Provider Triage Evaluation Note  [name] [name] , a 87 y.o. female  was evaluated in triage. Pt complains of bilateral leg swelling and swelling around her abdomen. H/o ESRD. Still produces urine. Denies chest pain, shortness of breath. Has missed dialysis for the past 2 weeks. States she was "tired of going".  Review of Systems  Positive: See above Negative: See above  Physical Exam  BP (!) 178/77 (BP Location: Right Arm)   Pulse 72   Temp 98.1 F (36.7 C)   Resp 17   SpO2 100%  Gen:   Awake, no distress   Resp:  Normal effort  MSK:   Moves extremities without difficulty  Other:    Medical Decision Making  Medically screening exam initiated at 10:00 PM.  Appropriate orders placed.  Solei Hemmerling Borchard was informed that the remainder of the evaluation will be completed by another provider, this initial triage assessment does not replace that evaluation, and the importance of remaining in the ED until their evaluation is complete.  Work up started   Gareth Eagle, PA-C 07/17/23 2202    Gareth Eagle, PA-C 07/17/23 2203

## 2023-07-17 NOTE — ED Triage Notes (Signed)
Pt here for leg swelling, family states she has not been to dialysis in 2 weeks and noticed the leg swelling on Friday that has worsened over the weekend and now beginning to swell in abd and face. States her clothes are now fitting tighter. Pt states she just does not want to go to dialysis and be stuck with the needle. Denies complaints. Family reports she appeared ShOB yesterday, NAD noted in triage.

## 2023-07-17 NOTE — Telephone Encounter (Signed)
Called pt's daughter, [name] [name] as she is once again a no show to labs. We are not able to complete MD visit via telephone without lab results, as MD will need results to review.  Unable to reach pt's daughter, [name] [name] which with whom she resides. S/w Priscilla who states, "it seems like my mama has just gave up. She aint been to dialysis in 2 weeks. She said she don't care if she dies."  Per MD, pt can follow up with her PCP moving forward given the nature of difficulty scheduling; daughter [name] [name] was advised of this.   Encouraged [name] [name] to have pt f/u with PCP. She knows to call back if she needs further assistance.

## 2023-07-18 LAB — CBC
HCT: 30.1 % — ABNORMAL LOW (ref 36.0–46.0)
Hemoglobin: 9.8 g/dL — ABNORMAL LOW (ref 12.0–15.0)
MCH: 23.7 pg — ABNORMAL LOW (ref 26.0–34.0)
MCHC: 32.6 g/dL (ref 30.0–36.0)
MCV: 72.7 fL — ABNORMAL LOW (ref 80.0–100.0)
Platelets: 208 10*3/uL (ref 150–400)
RBC: 4.14 MIL/uL (ref 3.87–5.11)
RDW: 18.3 % — ABNORMAL HIGH (ref 11.5–15.5)
WBC: 6.4 10*3/uL (ref 4.0–10.5)
nRBC: 0 % (ref 0.0–0.2)

## 2023-07-18 LAB — TROPONIN I (HIGH SENSITIVITY): Troponin I (High Sensitivity): 17 ng/L (ref <18)

## 2023-07-18 LAB — RENAL FUNCTION PANEL
Albumin: 3.3 g/dL — ABNORMAL LOW (ref 3.5–5.0)
Anion gap: 9 (ref 5–15)
BUN: 69 mg/dL — ABNORMAL HIGH (ref 8–23)
CO2: 19 mmol/L — ABNORMAL LOW (ref 22–32)
Calcium: 8.4 mg/dL — ABNORMAL LOW (ref 8.9–10.3)
Chloride: 104 mmol/L (ref 98–111)
Creatinine, Ser: 8 mg/dL — ABNORMAL HIGH (ref 0.44–1.00)
GFR, Estimated: 5 mL/min — ABNORMAL LOW (ref >60)
Glucose, Bld: 183 mg/dL — ABNORMAL HIGH (ref 70–99)
Phosphorus: 4.3 mg/dL (ref 2.5–4.6)
Potassium: 4.7 mmol/L (ref 3.5–5.1)
Sodium: 132 mmol/L — ABNORMAL LOW (ref 135–145)

## 2023-07-18 LAB — HEPATITIS B SURFACE ANTIGEN: Hepatitis B Surface Ag: NONREACTIVE

## 2023-07-18 MED ORDER — LIDOCAINE-PRILOCAINE 2.5-2.5 % EX CREA: 1.0000 | TOPICAL_CREAM | CUTANEOUS | Status: DC | PRN

## 2023-07-18 MED ORDER — CHLORHEXIDINE GLUCONATE CLOTH 2 % EX PADS: 6.0000 | MEDICATED_PAD | Freq: Every day | CUTANEOUS | Status: DC

## 2023-07-18 MED ORDER — ACETAMINOPHEN 325 MG PO TABS
650.0000 mg | ORAL_TABLET | Freq: Once | ORAL | Status: AC
  Administered 2023-07-18: 650 mg via ORAL
  Filled 2023-07-18: qty 2

## 2023-07-18 MED ORDER — ANTICOAGULANT SODIUM CITRATE 4% (200MG/5ML) IV SOLN
5.0000 mL | Status: DC | PRN
  Filled 2023-07-18: qty 5

## 2023-07-18 MED ORDER — METOPROLOL TARTRATE 25 MG PO TABS
12.5000 mg | ORAL_TABLET | Freq: Once | ORAL | Status: AC
  Administered 2023-07-18: 12.5 mg via ORAL
  Filled 2023-07-18: qty 1

## 2023-07-18 MED ORDER — HEPARIN SODIUM (PORCINE) 1000 UNIT/ML DIALYSIS: 1000.0000 [IU] | INTRAMUSCULAR | Status: DC | PRN

## 2023-07-18 MED ORDER — ALTEPLASE 2 MG IJ SOLR: 2.0000 mg | Freq: Once | INTRAMUSCULAR | Status: DC | PRN

## 2023-07-18 MED ORDER — LIDOCAINE HCL (PF) 1 % IJ SOLN: 5.0000 mL | INTRAMUSCULAR | Status: DC | PRN

## 2023-07-18 MED ORDER — ACETAMINOPHEN 325 MG PO TABS: 650.0000 mg | ORAL_TABLET | Freq: Four times a day (QID) | ORAL | Status: DC | PRN

## 2023-07-18 MED ORDER — NEPRO/CARBSTEADY PO LIQD
237.0000 mL | ORAL | Status: DC | PRN
  Filled 2023-07-18: qty 237

## 2023-07-18 MED ORDER — PENTAFLUOROPROP-TETRAFLUOROETH EX AERO: 1.0000 | INHALATION_SPRAY | CUTANEOUS | Status: DC | PRN

## 2023-07-18 NOTE — ED Provider Notes (Signed)
This is a 87 year old female with history of dementia and yesterday in dialysis.  She has missed about a week and a half of dialysis.  She was seen here yesterday and remained in the ED for about 24 hours pending dialysis.  She is slightly short of breath and volume overloaded on exam.  I was called to evaluate as no one had signed up for the patient and she was taken back from dialysis.  She did receive dialysis and tolerated that well.  She is hypertensive but not receive her home metoprolol.  She is feeling much better, clear lungs, normal work of breathing.  Her legs are no longer swollen.  Family is at bedside and fell she is doing a lot better.  They feel comfortable her going home.  She has dialysis schedule again for later this week and they are able to help her get there.  She has her medications at home.  Ordered her home metoprolol here.  Patient family would like for her to go home which I think is reasonable.  Recommend PCP, nephrology follow-up and strict return precautions.   Laurence Spates, MD 07/18/23 [phone]

## 2023-07-18 NOTE — Progress Notes (Signed)
C/o cramping, fluid removal goal decreased to 3 L, UF turned off.

## 2023-07-18 NOTE — ED Notes (Signed)
Got patient undressed into a gown on the monitor patient is resting with family at bedside and call bell in reach got patient some warm blankets

## 2023-07-18 NOTE — Discharge Instructions (Signed)
The patient should follow-up with her nephrologist and primary care doctor and continue doing dialysis outpatient as scheduled.  If she develops any new or concerning symptoms she should return to the ED.

## 2023-07-18 NOTE — Procedures (Signed)
HD Note:  Some information was entered later than the data was gathered due to patient care needs. The stated time with the data is accurate.  Received patient in bed to unit.   Alert and oriented to person.  Patient stated she didn't do dialysis anymore.  She did agree to do it here since we were already together.  Informed consent signed by this writer and Rogers Blocker, PA  and in chart.  Unable to reach daughter, Octavio Graves, and message left.  Daughter returned call.  Plan for after dialysis made for this writer to call patient's daughter, [name], when patient is leaving unit to return to the ED  Access used: Upper left arm fistula Access issues: None  Patient tolerated treatment well.   TX duration: 3.25 hours  Alert, without acute distress.  Total UF removed: 2500 ml.  Patient UF had to be turned off for leg cramping during treatment.  Hand-off given to patient's nurse.   Transported back to the room   Saje Gallop L. Dareen Piano, RN [facility].

## 2023-07-18 NOTE — ED Provider Notes (Signed)
[place] EMERGENCY DEPARTMENT AT [facility] Provider Note   CSN: [id number] Arrival date & time: 07/17/23  1646     History  Chief Complaint  Patient presents with   Leg Swelling    [name] [name] is a 87 y.o. female.  HPI Presents with her daughter who assists with the history.  Patient has multiple medical problems most notably dementia and end-stage renal disease.  Similarly the patient has not gone to dialysis in at least 1 week.  She seemingly has some apprehension about the needlestick for dialysis, cannot provide additional details about why she has not gone.  She presents with concern for worsening fatigue, anasarca, and dyspnea.  Level 5 caveat secondary to dementia. Daughter denies other recent health concerns including fever, fall.    Home Medications Prior to Admission medications   Medication Sig Start Date End Date Taking? Authorizing Provider  amLODipine (NORVASC) 10 MG tablet TAKE 1 TABLET(10 MG) BY MOUTH DAILY 02/08/23   Dorothyann Peng, MD  apixaban (ELIQUIS) 2.5 MG TABS tablet TAKE 1 TABLET(2.5 MG) BY MOUTH TWICE DAILY 04/19/22   Dorothyann Peng, MD  ascorbic acid (VITAMIN C) 500 MG tablet Take 1 tablet (500 mg total) by mouth daily. 08/15/20   Standley Brooking, MD  atorvastatin (LIPITOR) 20 MG tablet TAKE 1 TABLET(20 MG) BY MOUTH DAILY AT 6 PM FOR CHOLESTEROL Strength: 20 mg 04/19/22   Dorothyann Peng, MD  blood glucose meter kit and supplies KIT Dispense based on patient and insurance preference. Use up to four times daily as directed. (FOR ICD-9 250.00, 250.01). 08/20/21   Dorothyann Peng, MD  Blood Pressure Monitor KIT Use as directed to check blood pressure as needed 08/20/21   Dorothyann Peng, MD  cetirizine (ZYRTEC) 10 MG tablet Take 10 mg by mouth daily.    [provider]  donepezil (ARICEPT) 5 MG tablet TAKE 1 TABLET(5 MG) BY MOUTH AT BEDTIME 02/20/23   Dorothyann Peng, MD  glucose blood test strip Use as directed to check blood sugars 3  times per day dx: e11.65 06/09/20   Dorothyann Peng, MD  lanthanum (FOSRENOL) 1000 MG chewable tablet Chew 1,000 mg by mouth 3 (three) times daily with meals. Takes twice daily    [provider]  lidocaine (LMX) 4 % cream SMARTSIG:Sparingly Topical As Directed 07/16/21   [provider]  loratadine (CLARITIN) 10 MG tablet Take 10 mg by mouth daily.    [provider]  metoprolol tartrate (LOPRESSOR) 25 MG tablet TAKE 1/2 TABLET(12.5 MG) BY MOUTH TWICE DAILY 02/15/23   Dorothyann Peng, MD  polyethylene glycol (MIRALAX) 17 g packet Take 17 g by mouth daily as needed. 07/31/21   Burnadette Pop, MD  sevelamer (RENAGEL) 800 MG tablet Take by mouth 3 (three) times daily with meals.    [provider]  zinc sulfate 220 (50 Zn) MG capsule Take 1 capsule (220 mg total) by mouth daily. 08/15/20   Standley Brooking, MD      Allergies    Penicillins    Review of Systems   Review of Systems  Physical Exam Updated Vital Signs BP (!) 192/77 (BP Location: Right Arm)   Pulse 80   Temp 97.9 F (36.6 C)   Resp 18   SpO2 99%  Physical Exam Vitals and nursing note reviewed.  Constitutional:      General: She is not in acute distress.    Appearance: She is well-developed.  HENT:     Head: Normocephalic and  atraumatic.  Eyes:     Conjunctiva/sclera: Conjunctivae normal.  Cardiovascular:     Rate and Rhythm: Normal rate and regular rhythm.  Pulmonary:     Effort: Pulmonary effort is normal. No respiratory distress.     Breath sounds: Normal breath sounds. No stridor.  Abdominal:     General: There is distension.     Tenderness: There is no abdominal tenderness. There is no guarding.  Musculoskeletal:     Right lower leg: Edema present.     Left lower leg: Edema present.  Skin:    General: Skin is warm and dry.  Neurological:     Mental Status: She is alert and oriented to person, place, and time.     Cranial Nerves: No cranial nerve deficit.  Psychiatric:         Behavior: Behavior is slowed and withdrawn.        Cognition and Memory: Cognition is impaired. Memory is impaired.     ED Results / Procedures / Treatments   Labs (all labs ordered are listed, but only abnormal results are displayed) Labs Reviewed  BASIC METABOLIC PANEL - Abnormal; Notable for the following components:      Result Value   Sodium 134 (*)    Glucose, Bld 199 (*)    BUN 70 (*)    Creatinine, Ser 7.85 (*)    Calcium 8.3 (*)    GFR, Estimated 5 (*)    All other components within normal limits  CBC - Abnormal; Notable for the following components:   Hemoglobin 9.7 (*)    HCT 30.6 (*)    MCV 74.3 (*)    MCH 23.5 (*)    RDW 18.6 (*)    All other components within normal limits  TROPONIN I (HIGH SENSITIVITY) - Abnormal; Notable for the following components:   Troponin I (High Sensitivity) 19 (*)    All other components within normal limits  TROPONIN I (HIGH SENSITIVITY)    EKG EKG Interpretation Date/Time:  Monday July 17 2023 18:43:21 EST Ventricular Rate:  72 PR Interval:  170 QRS Duration:  80 QT Interval:  410 QTC Calculation: 448 R Axis:   -16  Text Interpretation: Normal sinus rhythm Cannot rule out Anterior infarct , age undetermined Abnormal ECG Confirmed by Gerhard Munch [phone]) on 07/18/2023 10:48:04 AM  Radiology DG Chest 1 View  Result Date: 07/17/2023 CLINICAL DATA:  Lower extremity swelling, initial encounter EXAM: PORTABLE CHEST 1 VIEW COMPARISON:  12/18/2021 FINDINGS: Cardiac shadow is enlarged but stable. Aortic calcifications are seen. Vascular congestion is noted likely related to volume overload from missed dialysis sessions. No focal infiltrate is seen. No effusion is noted. Bony structures are within normal limits. IMPRESSION: Vascular congestion related to volume overload. Electronically Signed   By: Alcide Clever M.D.   On: 07/17/2023 23:01    Procedures Procedures    Medications Ordered in ED Medications - No data to  display  ED Course/ Medical Decision Making/ A&P                                 Medical Decision Making Elderly female on dialysis, complicated by dementia, other medical problems presents with dyspnea, fatigue, anasarca.  Patient is awake and alert, does not require oxygen, but has had x-ray suggesting fluid retention consistent with physical exam.  Given concern for missed dialysis, anasarca, hypertension, I discussed her case with our nephrology team and the  patient will go for dialysis.  She may be appropriate for discharge following that session pending repeat evaluation.  No other evidence for acute concurrent phenomenon such as pneumonia, bacteremia, sepsis.  Amount and/or Complexity of Data Reviewed Independent Historian: caregiver External Data Reviewed: notes. Labs:  Decision-making details documented in ED Course. Radiology: independent interpretation performed. Decision-making details documented in ED Course. ECG/medicine tests: independent interpretation performed. Decision-making details documented in ED Course.  Risk Prescription drug management. Decision regarding hospitalization. Diagnosis or treatment significantly limited by social determinants of health.  Final Clinical Impression(s) / ED Diagnoses Final diagnoses:  Anasarca     Gerhard Munch, MD 07/18/23 1049

## 2023-07-18 NOTE — Procedures (Signed)
Asked to see for HD.  Patient is not being admitted. The plan will be for "ED HD". Pt will go to the dialysis unit upstairs when they are ready for the patient. When dialysis is completed pt will be sent back to ED for reassessment.    Exam shows on RA, +jvd, chest clear, 2+ bilat LE edema from ankles to mid hips No distress.     No room for outpatient dialysis at her OP unit this afternoon.  Plan ED HD as above.     [facility]  3h    400/1.5   63.5kg   2/ 2 bath  AVG   Heparin none - last OP HD 11/26, post wt 64.3kg       Vinson Moselle  MD  [facility] 07/18/2023, 11:00 AM   Last Labs     Recent Labs  Lab 07/17/23 2203  HGB 9.7*  CALCIUM 8.3*  CREATININE 7.85*  K 4.2        Inpatient medications:

## 2023-07-19 ENCOUNTER — Telehealth: Payer: Self-pay

## 2023-07-19 LAB — HEPATITIS B SURFACE ANTIBODY, QUANTITATIVE: Hep B S AB Quant (Post): 1566 m[IU]/mL

## 2023-07-19 NOTE — Transitions of Care (Post Inpatient/ED Visit) (Cosign Needed)
07/19/2023  Name: [name] [name] MRN: [id number] DOB: 1935-09-22  Today's TOC FU Call Status:   Patient's Name and Date of Birth confirmed.  Transition Care Management Follow-up Telephone Call Date of Discharge: 07/18/23 Discharge Facility[facility]) Type of Discharge: Emergency Department Reason for ED Visit: Other: How have you been since you were released from the hospital?: Better Any questions or concerns?: No  Items Reviewed: Did you receive and understand the discharge instructions provided?: Yes Medications obtained,verified, and reconciled?: Yes (Medications Reviewed) Any new allergies since your discharge?: No Dietary orders reviewed?: NA Do you have support at home?: Yes People in Home: child(ren), adult  Medications Reviewed Today: Medications Reviewed Today     Reviewed by Coolidge Breeze, CMA (Certified Medical Assistant) on 07/19/23 at 1634  Med List Status: <None>   Medication Order Taking? Sig Documenting Provider Last Dose Status Informant  amLODipine (NORVASC) 10 MG tablet [id number] Yes TAKE 1 TABLET(10 MG) BY MOUTH DAILY Dorothyann Peng, MD Taking Active   apixaban (ELIQUIS) 2.5 MG TABS tablet [id number] Yes TAKE 1 TABLET(2.5 MG) BY MOUTH TWICE DAILY Dorothyann Peng, MD Taking Active   ascorbic acid (VITAMIN C) 500 MG tablet [id number] Yes Take 1 tablet (500 mg total) by mouth daily. Standley Brooking, MD Taking Active Multiple Informants  atorvastatin (LIPITOR) 20 MG tablet [id number] Yes TAKE 1 TABLET(20 MG) BY MOUTH DAILY AT 6 PM FOR CHOLESTEROL Strength: 20 mg Dorothyann Peng, MD Taking Active   blood glucose meter kit and supplies KIT [id number] Yes Dispense based on patient and insurance preference. Use up to four times daily as directed. (FOR ICD-9 250.00, 250.01). Dorothyann Peng, MD Taking Active   Blood Pressure Monitor KIT [id number] Yes Use as directed to check blood pressure as needed Dorothyann Peng, MD Taking Active   cetirizine  (ZYRTEC) 10 MG tablet [id number] Yes Take 10 mg by mouth daily. [provider] Taking Active   donepezil (ARICEPT) 5 MG tablet [id number] Yes TAKE 1 TABLET(5 MG) BY MOUTH AT BEDTIME Dorothyann Peng, MD Taking Active   glucose blood test strip [id number] Yes Use as directed to check blood sugars 3 times per day dx: e11.65 Dorothyann Peng, MD Taking Active Multiple Informants  lanthanum (FOSRENOL) 1000 MG chewable tablet [id number] Yes Chew 1,000 mg by mouth 3 (three) times daily with meals. Takes twice daily [provider] Taking Active Child  lidocaine (LMX) 4 % cream [id number] Yes SMARTSIG:Sparingly Topical As Directed [provider] Taking Active   loratadine (CLARITIN) 10 MG tablet [id number] Yes Take 10 mg by mouth daily. [provider] Taking Active Multiple Informants  metoprolol tartrate (LOPRESSOR) 25 MG tablet [id number] Yes TAKE 1/2 TABLET(12.5 MG) BY MOUTH TWICE DAILY Dorothyann Peng, MD Taking Active   polyethylene glycol (MIRALAX) 17 g packet [id number] Yes Take 17 g by mouth daily as needed. Burnadette Pop, MD Taking Active   sevelamer (RENAGEL) 800 MG tablet [id number] Yes Take by mouth 3 (three) times daily with meals. [provider] Taking Active   zinc sulfate 220 (50 Zn) MG capsule [id number] Yes Take 1 capsule (220 mg total) by mouth daily. Standley Brooking, MD Taking Active Multiple Informants  Med List Note Orie Rout, [facility] 07/25/20 1245): Patient's daughter, Elliot Dally [phone]), helps with meds; Per SS records/insurance, pt DOB is 05/09/1936            Home Care and Equipment/Supplies: Were Home Health Services Ordered?: No Any new equipment or medical supplies ordered?: No  Functional Questionnaire:  Do you need assistance with bathing/showering or dressing?: Yes Do you need assistance with meal preparation?: No Do you need assistance with eating?: No Do you have difficulty maintaining continence: No Do you  need assistance with getting out of bed/getting out of a chair/moving?: No Do you have difficulty managing or taking your medications?: No  Follow up appointments reviewed: PCP Follow-up appointment confirmed?: Yes Date of PCP follow-up appointment?: 07/26/23 Follow-up Provider: [facility] Follow-up appointment confirmed?: NA Do you need transportation to your follow-up appointment?: No Do you understand care options if your condition(s) worsen?: Yes-patient verbalized understanding    SIGNATURE Randa Lynn, CMA

## 2023-07-21 DIAGNOSIS — E1110 Type 2 diabetes mellitus with ketoacidosis without coma: Secondary | ICD-10-CM | POA: Diagnosis not present

## 2023-07-21 DIAGNOSIS — D631 Anemia in chronic kidney disease: Secondary | ICD-10-CM | POA: Diagnosis not present

## 2023-07-21 DIAGNOSIS — N2581 Secondary hyperparathyroidism of renal origin: Secondary | ICD-10-CM | POA: Diagnosis not present

## 2023-07-21 DIAGNOSIS — N186 End stage renal disease: Secondary | ICD-10-CM | POA: Diagnosis not present

## 2023-07-21 DIAGNOSIS — Z992 Dependence on renal dialysis: Secondary | ICD-10-CM | POA: Diagnosis not present

## 2023-07-24 DIAGNOSIS — E1110 Type 2 diabetes mellitus with ketoacidosis without coma: Secondary | ICD-10-CM | POA: Diagnosis not present

## 2023-07-24 DIAGNOSIS — N186 End stage renal disease: Secondary | ICD-10-CM | POA: Diagnosis not present

## 2023-07-24 DIAGNOSIS — N2581 Secondary hyperparathyroidism of renal origin: Secondary | ICD-10-CM | POA: Diagnosis not present

## 2023-07-24 DIAGNOSIS — D631 Anemia in chronic kidney disease: Secondary | ICD-10-CM | POA: Diagnosis not present

## 2023-07-24 DIAGNOSIS — Z992 Dependence on renal dialysis: Secondary | ICD-10-CM | POA: Diagnosis not present

## 2023-07-25 ENCOUNTER — Other Ambulatory Visit: Payer: Self-pay

## 2023-07-25 ENCOUNTER — Ambulatory Visit: Payer: 59 | Admitting: Internal Medicine

## 2023-07-25 MED ORDER — APIXABAN 2.5 MG PO TABS: ORAL_TABLET | ORAL | 1 refills | Status: DC

## 2023-07-26 ENCOUNTER — Ambulatory Visit: Payer: 59 | Admitting: Internal Medicine

## 2023-07-26 DIAGNOSIS — N2581 Secondary hyperparathyroidism of renal origin: Secondary | ICD-10-CM | POA: Diagnosis not present

## 2023-07-26 DIAGNOSIS — E1110 Type 2 diabetes mellitus with ketoacidosis without coma: Secondary | ICD-10-CM | POA: Diagnosis not present

## 2023-07-26 DIAGNOSIS — N186 End stage renal disease: Secondary | ICD-10-CM | POA: Diagnosis not present

## 2023-07-26 DIAGNOSIS — D631 Anemia in chronic kidney disease: Secondary | ICD-10-CM | POA: Diagnosis not present

## 2023-07-26 DIAGNOSIS — Z992 Dependence on renal dialysis: Secondary | ICD-10-CM | POA: Diagnosis not present

## 2023-07-28 DIAGNOSIS — E1110 Type 2 diabetes mellitus with ketoacidosis without coma: Secondary | ICD-10-CM | POA: Diagnosis not present

## 2023-07-28 DIAGNOSIS — Z992 Dependence on renal dialysis: Secondary | ICD-10-CM | POA: Diagnosis not present

## 2023-07-28 DIAGNOSIS — D631 Anemia in chronic kidney disease: Secondary | ICD-10-CM | POA: Diagnosis not present

## 2023-07-28 DIAGNOSIS — N2581 Secondary hyperparathyroidism of renal origin: Secondary | ICD-10-CM | POA: Diagnosis not present

## 2023-07-28 DIAGNOSIS — N186 End stage renal disease: Secondary | ICD-10-CM | POA: Diagnosis not present

## 2023-07-30 DIAGNOSIS — Z992 Dependence on renal dialysis: Secondary | ICD-10-CM | POA: Diagnosis not present

## 2023-07-30 DIAGNOSIS — E1110 Type 2 diabetes mellitus with ketoacidosis without coma: Secondary | ICD-10-CM | POA: Diagnosis not present

## 2023-07-30 DIAGNOSIS — N2581 Secondary hyperparathyroidism of renal origin: Secondary | ICD-10-CM | POA: Diagnosis not present

## 2023-07-30 DIAGNOSIS — D631 Anemia in chronic kidney disease: Secondary | ICD-10-CM | POA: Diagnosis not present

## 2023-07-30 DIAGNOSIS — N186 End stage renal disease: Secondary | ICD-10-CM | POA: Diagnosis not present

## 2023-08-01 DIAGNOSIS — E1110 Type 2 diabetes mellitus with ketoacidosis without coma: Secondary | ICD-10-CM | POA: Diagnosis not present

## 2023-08-01 DIAGNOSIS — Z992 Dependence on renal dialysis: Secondary | ICD-10-CM | POA: Diagnosis not present

## 2023-08-01 DIAGNOSIS — N2581 Secondary hyperparathyroidism of renal origin: Secondary | ICD-10-CM | POA: Diagnosis not present

## 2023-08-01 DIAGNOSIS — N186 End stage renal disease: Secondary | ICD-10-CM | POA: Diagnosis not present

## 2023-08-01 DIAGNOSIS — D631 Anemia in chronic kidney disease: Secondary | ICD-10-CM | POA: Diagnosis not present

## 2023-08-04 DIAGNOSIS — Z992 Dependence on renal dialysis: Secondary | ICD-10-CM | POA: Diagnosis not present

## 2023-08-04 DIAGNOSIS — E1110 Type 2 diabetes mellitus with ketoacidosis without coma: Secondary | ICD-10-CM | POA: Diagnosis not present

## 2023-08-04 DIAGNOSIS — N2581 Secondary hyperparathyroidism of renal origin: Secondary | ICD-10-CM | POA: Diagnosis not present

## 2023-08-04 DIAGNOSIS — N186 End stage renal disease: Secondary | ICD-10-CM | POA: Diagnosis not present

## 2023-08-04 DIAGNOSIS — D631 Anemia in chronic kidney disease: Secondary | ICD-10-CM | POA: Diagnosis not present

## 2023-08-06 DIAGNOSIS — N2581 Secondary hyperparathyroidism of renal origin: Secondary | ICD-10-CM | POA: Diagnosis not present

## 2023-08-06 DIAGNOSIS — Z992 Dependence on renal dialysis: Secondary | ICD-10-CM | POA: Diagnosis not present

## 2023-08-06 DIAGNOSIS — E1110 Type 2 diabetes mellitus with ketoacidosis without coma: Secondary | ICD-10-CM | POA: Diagnosis not present

## 2023-08-06 DIAGNOSIS — N186 End stage renal disease: Secondary | ICD-10-CM | POA: Diagnosis not present

## 2023-08-06 DIAGNOSIS — D631 Anemia in chronic kidney disease: Secondary | ICD-10-CM | POA: Diagnosis not present

## 2023-08-08 DIAGNOSIS — N186 End stage renal disease: Secondary | ICD-10-CM | POA: Diagnosis not present

## 2023-08-08 DIAGNOSIS — N2581 Secondary hyperparathyroidism of renal origin: Secondary | ICD-10-CM | POA: Diagnosis not present

## 2023-08-08 DIAGNOSIS — Z992 Dependence on renal dialysis: Secondary | ICD-10-CM | POA: Diagnosis not present

## 2023-08-08 DIAGNOSIS — D631 Anemia in chronic kidney disease: Secondary | ICD-10-CM | POA: Diagnosis not present

## 2023-08-08 DIAGNOSIS — E1110 Type 2 diabetes mellitus with ketoacidosis without coma: Secondary | ICD-10-CM | POA: Diagnosis not present

## 2023-08-08 DIAGNOSIS — E1122 Type 2 diabetes mellitus with diabetic chronic kidney disease: Secondary | ICD-10-CM | POA: Diagnosis not present

## 2023-08-11 DIAGNOSIS — D631 Anemia in chronic kidney disease: Secondary | ICD-10-CM | POA: Diagnosis not present

## 2023-08-11 DIAGNOSIS — E1110 Type 2 diabetes mellitus with ketoacidosis without coma: Secondary | ICD-10-CM | POA: Diagnosis not present

## 2023-08-11 DIAGNOSIS — Z992 Dependence on renal dialysis: Secondary | ICD-10-CM | POA: Diagnosis not present

## 2023-08-11 DIAGNOSIS — N2581 Secondary hyperparathyroidism of renal origin: Secondary | ICD-10-CM | POA: Diagnosis not present

## 2023-08-11 DIAGNOSIS — N186 End stage renal disease: Secondary | ICD-10-CM | POA: Diagnosis not present

## 2023-08-16 DIAGNOSIS — E1110 Type 2 diabetes mellitus with ketoacidosis without coma: Secondary | ICD-10-CM | POA: Diagnosis not present

## 2023-08-16 DIAGNOSIS — D631 Anemia in chronic kidney disease: Secondary | ICD-10-CM | POA: Diagnosis not present

## 2023-08-16 DIAGNOSIS — N2581 Secondary hyperparathyroidism of renal origin: Secondary | ICD-10-CM | POA: Diagnosis not present

## 2023-08-16 DIAGNOSIS — N186 End stage renal disease: Secondary | ICD-10-CM | POA: Diagnosis not present

## 2023-08-16 DIAGNOSIS — Z992 Dependence on renal dialysis: Secondary | ICD-10-CM | POA: Diagnosis not present

## 2023-08-17 ENCOUNTER — Encounter (HOSPITAL_COMMUNITY): Payer: Self-pay | Admitting: Nephrology

## 2023-08-17 DIAGNOSIS — E1110 Type 2 diabetes mellitus with ketoacidosis without coma: Secondary | ICD-10-CM | POA: Diagnosis not present

## 2023-08-17 DIAGNOSIS — N2581 Secondary hyperparathyroidism of renal origin: Secondary | ICD-10-CM | POA: Diagnosis not present

## 2023-08-17 DIAGNOSIS — Z992 Dependence on renal dialysis: Secondary | ICD-10-CM | POA: Diagnosis not present

## 2023-08-17 DIAGNOSIS — N186 End stage renal disease: Secondary | ICD-10-CM | POA: Diagnosis not present

## 2023-08-17 DIAGNOSIS — D631 Anemia in chronic kidney disease: Secondary | ICD-10-CM | POA: Diagnosis not present

## 2023-08-21 DIAGNOSIS — Z992 Dependence on renal dialysis: Secondary | ICD-10-CM | POA: Diagnosis not present

## 2023-08-21 DIAGNOSIS — D631 Anemia in chronic kidney disease: Secondary | ICD-10-CM | POA: Diagnosis not present

## 2023-08-21 DIAGNOSIS — E1110 Type 2 diabetes mellitus with ketoacidosis without coma: Secondary | ICD-10-CM | POA: Diagnosis not present

## 2023-08-21 DIAGNOSIS — N186 End stage renal disease: Secondary | ICD-10-CM | POA: Diagnosis not present

## 2023-08-21 DIAGNOSIS — N2581 Secondary hyperparathyroidism of renal origin: Secondary | ICD-10-CM | POA: Diagnosis not present

## 2023-08-23 DIAGNOSIS — N186 End stage renal disease: Secondary | ICD-10-CM | POA: Diagnosis not present

## 2023-08-23 DIAGNOSIS — N2581 Secondary hyperparathyroidism of renal origin: Secondary | ICD-10-CM | POA: Diagnosis not present

## 2023-08-23 DIAGNOSIS — D631 Anemia in chronic kidney disease: Secondary | ICD-10-CM | POA: Diagnosis not present

## 2023-08-23 DIAGNOSIS — E1110 Type 2 diabetes mellitus with ketoacidosis without coma: Secondary | ICD-10-CM | POA: Diagnosis not present

## 2023-08-23 DIAGNOSIS — Z992 Dependence on renal dialysis: Secondary | ICD-10-CM | POA: Diagnosis not present

## 2023-08-28 DIAGNOSIS — D631 Anemia in chronic kidney disease: Secondary | ICD-10-CM | POA: Diagnosis not present

## 2023-08-28 DIAGNOSIS — N2581 Secondary hyperparathyroidism of renal origin: Secondary | ICD-10-CM | POA: Diagnosis not present

## 2023-08-28 DIAGNOSIS — N186 End stage renal disease: Secondary | ICD-10-CM | POA: Diagnosis not present

## 2023-08-28 DIAGNOSIS — E1110 Type 2 diabetes mellitus with ketoacidosis without coma: Secondary | ICD-10-CM | POA: Diagnosis not present

## 2023-08-28 DIAGNOSIS — Z992 Dependence on renal dialysis: Secondary | ICD-10-CM | POA: Diagnosis not present

## 2023-08-30 DIAGNOSIS — E1110 Type 2 diabetes mellitus with ketoacidosis without coma: Secondary | ICD-10-CM | POA: Diagnosis not present

## 2023-08-30 DIAGNOSIS — D631 Anemia in chronic kidney disease: Secondary | ICD-10-CM | POA: Diagnosis not present

## 2023-08-30 DIAGNOSIS — N2581 Secondary hyperparathyroidism of renal origin: Secondary | ICD-10-CM | POA: Diagnosis not present

## 2023-08-30 DIAGNOSIS — N186 End stage renal disease: Secondary | ICD-10-CM | POA: Diagnosis not present

## 2023-08-30 DIAGNOSIS — Z992 Dependence on renal dialysis: Secondary | ICD-10-CM | POA: Diagnosis not present

## 2023-09-04 DIAGNOSIS — N2581 Secondary hyperparathyroidism of renal origin: Secondary | ICD-10-CM | POA: Diagnosis not present

## 2023-09-04 DIAGNOSIS — E1110 Type 2 diabetes mellitus with ketoacidosis without coma: Secondary | ICD-10-CM | POA: Diagnosis not present

## 2023-09-04 DIAGNOSIS — N186 End stage renal disease: Secondary | ICD-10-CM | POA: Diagnosis not present

## 2023-09-04 DIAGNOSIS — D631 Anemia in chronic kidney disease: Secondary | ICD-10-CM | POA: Diagnosis not present

## 2023-09-04 DIAGNOSIS — Z992 Dependence on renal dialysis: Secondary | ICD-10-CM | POA: Diagnosis not present

## 2023-09-06 DIAGNOSIS — N2581 Secondary hyperparathyroidism of renal origin: Secondary | ICD-10-CM | POA: Diagnosis not present

## 2023-09-06 DIAGNOSIS — D631 Anemia in chronic kidney disease: Secondary | ICD-10-CM | POA: Diagnosis not present

## 2023-09-06 DIAGNOSIS — Z992 Dependence on renal dialysis: Secondary | ICD-10-CM | POA: Diagnosis not present

## 2023-09-06 DIAGNOSIS — E1110 Type 2 diabetes mellitus with ketoacidosis without coma: Secondary | ICD-10-CM | POA: Diagnosis not present

## 2023-09-06 DIAGNOSIS — N186 End stage renal disease: Secondary | ICD-10-CM | POA: Diagnosis not present

## 2023-09-08 DIAGNOSIS — E1122 Type 2 diabetes mellitus with diabetic chronic kidney disease: Secondary | ICD-10-CM | POA: Diagnosis not present

## 2023-09-08 DIAGNOSIS — Z992 Dependence on renal dialysis: Secondary | ICD-10-CM | POA: Diagnosis not present

## 2023-09-08 DIAGNOSIS — N186 End stage renal disease: Secondary | ICD-10-CM | POA: Diagnosis not present

## 2023-09-11 DIAGNOSIS — E1110 Type 2 diabetes mellitus with ketoacidosis without coma: Secondary | ICD-10-CM | POA: Diagnosis not present

## 2023-09-11 DIAGNOSIS — E119 Type 2 diabetes mellitus without complications: Secondary | ICD-10-CM | POA: Diagnosis not present

## 2023-09-11 DIAGNOSIS — N186 End stage renal disease: Secondary | ICD-10-CM | POA: Diagnosis not present

## 2023-09-11 DIAGNOSIS — Z992 Dependence on renal dialysis: Secondary | ICD-10-CM | POA: Diagnosis not present

## 2023-09-11 DIAGNOSIS — N2581 Secondary hyperparathyroidism of renal origin: Secondary | ICD-10-CM | POA: Diagnosis not present

## 2023-09-11 DIAGNOSIS — D631 Anemia in chronic kidney disease: Secondary | ICD-10-CM | POA: Diagnosis not present

## 2023-09-11 DIAGNOSIS — E1129 Type 2 diabetes mellitus with other diabetic kidney complication: Secondary | ICD-10-CM | POA: Diagnosis not present

## 2023-09-13 DIAGNOSIS — N2581 Secondary hyperparathyroidism of renal origin: Secondary | ICD-10-CM | POA: Diagnosis not present

## 2023-09-13 DIAGNOSIS — E1110 Type 2 diabetes mellitus with ketoacidosis without coma: Secondary | ICD-10-CM | POA: Diagnosis not present

## 2023-09-13 DIAGNOSIS — Z992 Dependence on renal dialysis: Secondary | ICD-10-CM | POA: Diagnosis not present

## 2023-09-13 DIAGNOSIS — N186 End stage renal disease: Secondary | ICD-10-CM | POA: Diagnosis not present

## 2023-09-13 DIAGNOSIS — E1129 Type 2 diabetes mellitus with other diabetic kidney complication: Secondary | ICD-10-CM | POA: Diagnosis not present

## 2023-09-13 DIAGNOSIS — D631 Anemia in chronic kidney disease: Secondary | ICD-10-CM | POA: Diagnosis not present

## 2023-09-13 DIAGNOSIS — E119 Type 2 diabetes mellitus without complications: Secondary | ICD-10-CM | POA: Diagnosis not present

## 2023-09-15 DIAGNOSIS — E1110 Type 2 diabetes mellitus with ketoacidosis without coma: Secondary | ICD-10-CM | POA: Diagnosis not present

## 2023-09-15 DIAGNOSIS — N2581 Secondary hyperparathyroidism of renal origin: Secondary | ICD-10-CM | POA: Diagnosis not present

## 2023-09-15 DIAGNOSIS — E1129 Type 2 diabetes mellitus with other diabetic kidney complication: Secondary | ICD-10-CM | POA: Diagnosis not present

## 2023-09-15 DIAGNOSIS — Z992 Dependence on renal dialysis: Secondary | ICD-10-CM | POA: Diagnosis not present

## 2023-09-15 DIAGNOSIS — D631 Anemia in chronic kidney disease: Secondary | ICD-10-CM | POA: Diagnosis not present

## 2023-09-15 DIAGNOSIS — N186 End stage renal disease: Secondary | ICD-10-CM | POA: Diagnosis not present

## 2023-09-15 DIAGNOSIS — E119 Type 2 diabetes mellitus without complications: Secondary | ICD-10-CM | POA: Diagnosis not present

## 2023-09-18 DIAGNOSIS — E1110 Type 2 diabetes mellitus with ketoacidosis without coma: Secondary | ICD-10-CM | POA: Diagnosis not present

## 2023-09-18 DIAGNOSIS — N186 End stage renal disease: Secondary | ICD-10-CM | POA: Diagnosis not present

## 2023-09-18 DIAGNOSIS — E1129 Type 2 diabetes mellitus with other diabetic kidney complication: Secondary | ICD-10-CM | POA: Diagnosis not present

## 2023-09-18 DIAGNOSIS — Z992 Dependence on renal dialysis: Secondary | ICD-10-CM | POA: Diagnosis not present

## 2023-09-18 DIAGNOSIS — E119 Type 2 diabetes mellitus without complications: Secondary | ICD-10-CM | POA: Diagnosis not present

## 2023-09-18 DIAGNOSIS — D631 Anemia in chronic kidney disease: Secondary | ICD-10-CM | POA: Diagnosis not present

## 2023-09-18 DIAGNOSIS — N2581 Secondary hyperparathyroidism of renal origin: Secondary | ICD-10-CM | POA: Diagnosis not present

## 2023-09-20 DIAGNOSIS — Z992 Dependence on renal dialysis: Secondary | ICD-10-CM | POA: Diagnosis not present

## 2023-09-20 DIAGNOSIS — N2581 Secondary hyperparathyroidism of renal origin: Secondary | ICD-10-CM | POA: Diagnosis not present

## 2023-09-20 DIAGNOSIS — D631 Anemia in chronic kidney disease: Secondary | ICD-10-CM | POA: Diagnosis not present

## 2023-09-20 DIAGNOSIS — E1129 Type 2 diabetes mellitus with other diabetic kidney complication: Secondary | ICD-10-CM | POA: Diagnosis not present

## 2023-09-20 DIAGNOSIS — E1110 Type 2 diabetes mellitus with ketoacidosis without coma: Secondary | ICD-10-CM | POA: Diagnosis not present

## 2023-09-20 DIAGNOSIS — N186 End stage renal disease: Secondary | ICD-10-CM | POA: Diagnosis not present

## 2023-09-20 DIAGNOSIS — E119 Type 2 diabetes mellitus without complications: Secondary | ICD-10-CM | POA: Diagnosis not present

## 2023-09-22 DIAGNOSIS — E1129 Type 2 diabetes mellitus with other diabetic kidney complication: Secondary | ICD-10-CM | POA: Diagnosis not present

## 2023-09-22 DIAGNOSIS — N186 End stage renal disease: Secondary | ICD-10-CM | POA: Diagnosis not present

## 2023-09-22 DIAGNOSIS — E119 Type 2 diabetes mellitus without complications: Secondary | ICD-10-CM | POA: Diagnosis not present

## 2023-09-22 DIAGNOSIS — N2581 Secondary hyperparathyroidism of renal origin: Secondary | ICD-10-CM | POA: Diagnosis not present

## 2023-09-22 DIAGNOSIS — Z992 Dependence on renal dialysis: Secondary | ICD-10-CM | POA: Diagnosis not present

## 2023-09-22 DIAGNOSIS — D631 Anemia in chronic kidney disease: Secondary | ICD-10-CM | POA: Diagnosis not present

## 2023-09-22 DIAGNOSIS — E1110 Type 2 diabetes mellitus with ketoacidosis without coma: Secondary | ICD-10-CM | POA: Diagnosis not present

## 2023-09-25 DIAGNOSIS — D631 Anemia in chronic kidney disease: Secondary | ICD-10-CM | POA: Diagnosis not present

## 2023-09-25 DIAGNOSIS — E1110 Type 2 diabetes mellitus with ketoacidosis without coma: Secondary | ICD-10-CM | POA: Diagnosis not present

## 2023-09-25 DIAGNOSIS — E1129 Type 2 diabetes mellitus with other diabetic kidney complication: Secondary | ICD-10-CM | POA: Diagnosis not present

## 2023-09-25 DIAGNOSIS — Z992 Dependence on renal dialysis: Secondary | ICD-10-CM | POA: Diagnosis not present

## 2023-09-25 DIAGNOSIS — N2581 Secondary hyperparathyroidism of renal origin: Secondary | ICD-10-CM | POA: Diagnosis not present

## 2023-09-25 DIAGNOSIS — N186 End stage renal disease: Secondary | ICD-10-CM | POA: Diagnosis not present

## 2023-09-25 DIAGNOSIS — E119 Type 2 diabetes mellitus without complications: Secondary | ICD-10-CM | POA: Diagnosis not present

## 2023-09-29 DIAGNOSIS — D631 Anemia in chronic kidney disease: Secondary | ICD-10-CM | POA: Diagnosis not present

## 2023-09-29 DIAGNOSIS — N186 End stage renal disease: Secondary | ICD-10-CM | POA: Diagnosis not present

## 2023-09-29 DIAGNOSIS — E1129 Type 2 diabetes mellitus with other diabetic kidney complication: Secondary | ICD-10-CM | POA: Diagnosis not present

## 2023-09-29 DIAGNOSIS — E1110 Type 2 diabetes mellitus with ketoacidosis without coma: Secondary | ICD-10-CM | POA: Diagnosis not present

## 2023-09-29 DIAGNOSIS — Z992 Dependence on renal dialysis: Secondary | ICD-10-CM | POA: Diagnosis not present

## 2023-09-29 DIAGNOSIS — N2581 Secondary hyperparathyroidism of renal origin: Secondary | ICD-10-CM | POA: Diagnosis not present

## 2023-09-29 DIAGNOSIS — E119 Type 2 diabetes mellitus without complications: Secondary | ICD-10-CM | POA: Diagnosis not present

## 2023-10-02 DIAGNOSIS — E1129 Type 2 diabetes mellitus with other diabetic kidney complication: Secondary | ICD-10-CM | POA: Diagnosis not present

## 2023-10-02 DIAGNOSIS — E1110 Type 2 diabetes mellitus with ketoacidosis without coma: Secondary | ICD-10-CM | POA: Diagnosis not present

## 2023-10-02 DIAGNOSIS — Z992 Dependence on renal dialysis: Secondary | ICD-10-CM | POA: Diagnosis not present

## 2023-10-02 DIAGNOSIS — N2581 Secondary hyperparathyroidism of renal origin: Secondary | ICD-10-CM | POA: Diagnosis not present

## 2023-10-02 DIAGNOSIS — D631 Anemia in chronic kidney disease: Secondary | ICD-10-CM | POA: Diagnosis not present

## 2023-10-02 DIAGNOSIS — N186 End stage renal disease: Secondary | ICD-10-CM | POA: Diagnosis not present

## 2023-10-02 DIAGNOSIS — E119 Type 2 diabetes mellitus without complications: Secondary | ICD-10-CM | POA: Diagnosis not present

## 2023-10-04 DIAGNOSIS — E1110 Type 2 diabetes mellitus with ketoacidosis without coma: Secondary | ICD-10-CM | POA: Diagnosis not present

## 2023-10-04 DIAGNOSIS — Z992 Dependence on renal dialysis: Secondary | ICD-10-CM | POA: Diagnosis not present

## 2023-10-04 DIAGNOSIS — N2581 Secondary hyperparathyroidism of renal origin: Secondary | ICD-10-CM | POA: Diagnosis not present

## 2023-10-04 DIAGNOSIS — D631 Anemia in chronic kidney disease: Secondary | ICD-10-CM | POA: Diagnosis not present

## 2023-10-04 DIAGNOSIS — N186 End stage renal disease: Secondary | ICD-10-CM | POA: Diagnosis not present

## 2023-10-04 DIAGNOSIS — E119 Type 2 diabetes mellitus without complications: Secondary | ICD-10-CM | POA: Diagnosis not present

## 2023-10-04 DIAGNOSIS — E1129 Type 2 diabetes mellitus with other diabetic kidney complication: Secondary | ICD-10-CM | POA: Diagnosis not present

## 2023-10-06 DIAGNOSIS — E1122 Type 2 diabetes mellitus with diabetic chronic kidney disease: Secondary | ICD-10-CM | POA: Diagnosis not present

## 2023-10-06 DIAGNOSIS — E1129 Type 2 diabetes mellitus with other diabetic kidney complication: Secondary | ICD-10-CM | POA: Diagnosis not present

## 2023-10-06 DIAGNOSIS — E1110 Type 2 diabetes mellitus with ketoacidosis without coma: Secondary | ICD-10-CM | POA: Diagnosis not present

## 2023-10-06 DIAGNOSIS — Z992 Dependence on renal dialysis: Secondary | ICD-10-CM | POA: Diagnosis not present

## 2023-10-06 DIAGNOSIS — D631 Anemia in chronic kidney disease: Secondary | ICD-10-CM | POA: Diagnosis not present

## 2023-10-06 DIAGNOSIS — E119 Type 2 diabetes mellitus without complications: Secondary | ICD-10-CM | POA: Diagnosis not present

## 2023-10-06 DIAGNOSIS — N2581 Secondary hyperparathyroidism of renal origin: Secondary | ICD-10-CM | POA: Diagnosis not present

## 2023-10-06 DIAGNOSIS — N186 End stage renal disease: Secondary | ICD-10-CM | POA: Diagnosis not present

## 2023-10-09 DIAGNOSIS — N186 End stage renal disease: Secondary | ICD-10-CM | POA: Diagnosis not present

## 2023-10-09 DIAGNOSIS — E8770 Fluid overload, unspecified: Secondary | ICD-10-CM | POA: Diagnosis not present

## 2023-10-09 DIAGNOSIS — Z992 Dependence on renal dialysis: Secondary | ICD-10-CM | POA: Diagnosis not present

## 2023-10-09 DIAGNOSIS — N2581 Secondary hyperparathyroidism of renal origin: Secondary | ICD-10-CM | POA: Diagnosis not present

## 2023-10-09 DIAGNOSIS — D631 Anemia in chronic kidney disease: Secondary | ICD-10-CM | POA: Diagnosis not present

## 2023-10-10 DIAGNOSIS — N186 End stage renal disease: Secondary | ICD-10-CM | POA: Diagnosis not present

## 2023-10-10 DIAGNOSIS — N2581 Secondary hyperparathyroidism of renal origin: Secondary | ICD-10-CM | POA: Diagnosis not present

## 2023-10-10 DIAGNOSIS — E8770 Fluid overload, unspecified: Secondary | ICD-10-CM | POA: Diagnosis not present

## 2023-10-10 DIAGNOSIS — Z992 Dependence on renal dialysis: Secondary | ICD-10-CM | POA: Diagnosis not present

## 2023-10-10 DIAGNOSIS — D631 Anemia in chronic kidney disease: Secondary | ICD-10-CM | POA: Diagnosis not present

## 2023-10-14 ENCOUNTER — Emergency Department (HOSPITAL_COMMUNITY)
Admission: EM | Admit: 2023-10-14 | Discharge: 2023-10-15 | Disposition: A | Discharge status: Home / Self Care | Attending: Emergency Medicine | Admitting: Emergency Medicine

## 2023-10-14 ENCOUNTER — Other Ambulatory Visit: Payer: Self-pay

## 2023-10-14 ENCOUNTER — Emergency Department (HOSPITAL_COMMUNITY)

## 2023-10-14 DIAGNOSIS — R918 Other nonspecific abnormal finding of lung field: Secondary | ICD-10-CM | POA: Diagnosis not present

## 2023-10-14 DIAGNOSIS — R197 Diarrhea, unspecified: Secondary | ICD-10-CM | POA: Diagnosis not present

## 2023-10-14 DIAGNOSIS — R110 Nausea: Secondary | ICD-10-CM | POA: Diagnosis not present

## 2023-10-14 DIAGNOSIS — R531 Weakness: Secondary | ICD-10-CM | POA: Insufficient documentation

## 2023-10-14 DIAGNOSIS — R42 Dizziness and giddiness: Secondary | ICD-10-CM | POA: Diagnosis not present

## 2023-10-14 DIAGNOSIS — R609 Edema, unspecified: Secondary | ICD-10-CM | POA: Diagnosis not present

## 2023-10-14 DIAGNOSIS — Z992 Dependence on renal dialysis: Secondary | ICD-10-CM

## 2023-10-14 DIAGNOSIS — Z7901 Long term (current) use of anticoagulants: Secondary | ICD-10-CM | POA: Insufficient documentation

## 2023-10-14 DIAGNOSIS — I120 Hypertensive chronic kidney disease with stage 5 chronic kidney disease or end stage renal disease: Secondary | ICD-10-CM | POA: Diagnosis not present

## 2023-10-14 DIAGNOSIS — R0602 Shortness of breath: Secondary | ICD-10-CM | POA: Diagnosis not present

## 2023-10-14 DIAGNOSIS — R0989 Other specified symptoms and signs involving the circulatory and respiratory systems: Secondary | ICD-10-CM | POA: Diagnosis not present

## 2023-10-14 DIAGNOSIS — N186 End stage renal disease: Secondary | ICD-10-CM | POA: Diagnosis not present

## 2023-10-14 DIAGNOSIS — I517 Cardiomegaly: Secondary | ICD-10-CM | POA: Diagnosis not present

## 2023-10-14 LAB — COMPREHENSIVE METABOLIC PANEL
ALT: 39 U/L (ref 0–44)
AST: 30 U/L (ref 15–41)
Albumin: 3.6 g/dL (ref 3.5–5.0)
Alkaline Phosphatase: 76 U/L (ref 38–126)
Anion gap: 16 — ABNORMAL HIGH (ref 5–15)
BUN: 74 mg/dL — ABNORMAL HIGH (ref 8–23)
CO2: 23 mmol/L (ref 22–32)
Calcium: 8.4 mg/dL — ABNORMAL LOW (ref 8.9–10.3)
Chloride: 90 mmol/L — ABNORMAL LOW (ref 98–111)
Creatinine, Ser: 6.96 mg/dL — ABNORMAL HIGH (ref 0.44–1.00)
GFR, Estimated: 5 mL/min — ABNORMAL LOW (ref >60)
Glucose, Bld: 170 mg/dL — ABNORMAL HIGH (ref 70–99)
Potassium: 4.7 mmol/L (ref 3.5–5.1)
Sodium: 129 mmol/L — ABNORMAL LOW (ref 135–145)
Total Bilirubin: 0.7 mg/dL (ref 0.0–1.2)
Total Protein: 6.8 g/dL (ref 6.5–8.1)

## 2023-10-14 LAB — I-STAT CHEM 8, ED
BUN: 63 mg/dL — ABNORMAL HIGH (ref 8–23)
Calcium, Ion: 1.01 mmol/L — ABNORMAL LOW (ref 1.15–1.40)
Chloride: 94 mmol/L — ABNORMAL LOW (ref 98–111)
Creatinine, Ser: 7.9 mg/dL — ABNORMAL HIGH (ref 0.44–1.00)
Glucose, Bld: 164 mg/dL — ABNORMAL HIGH (ref 70–99)
HCT: 36 % (ref 36.0–46.0)
Hemoglobin: 12.2 g/dL (ref 12.0–15.0)
Potassium: 4.7 mmol/L (ref 3.5–5.1)
Sodium: 127 mmol/L — ABNORMAL LOW (ref 135–145)
TCO2: 24 mmol/L (ref 22–32)

## 2023-10-14 LAB — CBC WITH DIFFERENTIAL/PLATELET
Abs Immature Granulocytes: 0.01 10*3/uL (ref 0.00–0.07)
Basophils Absolute: 0 10*3/uL (ref 0.0–0.1)
Basophils Relative: 0 %
Eosinophils Absolute: 0 10*3/uL (ref 0.0–0.5)
Eosinophils Relative: 0 %
HCT: 30.9 % — ABNORMAL LOW (ref 36.0–46.0)
Hemoglobin: 10.1 g/dL — ABNORMAL LOW (ref 12.0–15.0)
Immature Granulocytes: 0 %
Lymphocytes Relative: 12 %
Lymphs Abs: 0.6 10*3/uL — ABNORMAL LOW (ref 0.7–4.0)
MCH: 25.6 pg — ABNORMAL LOW (ref 26.0–34.0)
MCHC: 32.7 g/dL (ref 30.0–36.0)
MCV: 78.4 fL — ABNORMAL LOW (ref 80.0–100.0)
Monocytes Absolute: 0.2 10*3/uL (ref 0.1–1.0)
Monocytes Relative: 4 %
Neutro Abs: 4 10*3/uL (ref 1.7–7.7)
Neutrophils Relative %: 84 %
Platelets: 198 10*3/uL (ref 150–400)
RBC: 3.94 MIL/uL (ref 3.87–5.11)
RDW: 19.5 % — ABNORMAL HIGH (ref 11.5–15.5)
WBC: 4.9 10*3/uL (ref 4.0–10.5)
nRBC: 0.6 % — ABNORMAL HIGH (ref 0.0–0.2)

## 2023-10-14 LAB — CBG MONITORING, ED: Glucose-Capillary: 159 mg/dL — ABNORMAL HIGH (ref 70–99)

## 2023-10-14 LAB — TSH: TSH: 0.768 u[IU]/mL (ref 0.350–4.500)

## 2023-10-14 LAB — LIPASE, BLOOD: Lipase: 47 U/L (ref 11–51)

## 2023-10-14 MED ORDER — CHLORHEXIDINE GLUCONATE CLOTH 2 % EX PADS: 6.0000 | MEDICATED_PAD | Freq: Every day | CUTANEOUS | Status: DC

## 2023-10-14 NOTE — Progress Notes (Signed)
 Asked to see for patient ED HD. Pt doesn't need admission at this time. Pt missed HD due to GI symptoms (diarrhea). Missed 1-2 HD sessions, comes in c/o leg swelling and SOB. CXR showing vasc congestion and possibly some infiltrates or edema as well. Pt seen in ED.  Plan is for ED HD.   OP HD: [facility]  3h   B400  63.4kg   2K bath   AVG    No heparin  - last OP HD 3/04, post wt 66.5kg , coming off 2-5kg over the last 2 wks  Vinson Moselle  MD  [facility] 10/14/2023, 6:15 PM  Recent Labs  Lab 10/14/23 1714 10/14/23 1719  HGB 10.1* 12.2  ALBUMIN 3.6  --   CALCIUM 8.4*  --   CREATININE 6.96* 7.90*  K 4.7 4.7    Inpatient medications:

## 2023-10-14 NOTE — ED Provider Notes (Signed)
 [facility] AT [facility] Provider Note   CSN: [id number] Arrival date & time: 10/14/23  1536     History  Chief Complaint  Patient presents with   Weakness    [name] [name] is a 88 y.o. female.  The history is provided by the patient, medical records and a relative. No language interpreter was used.  Weakness Severity:  Severe Onset quality:  Gradual Duration:  4 days Timing:  Constant Progression:  Waxing and waning Chronicity:  Recurrent Context: not recent infection and not stress   Relieved by:  Nothing Worsened by:  Nothing Ineffective treatments:  None tried Associated symptoms: diarrhea, nausea and shortness of breath   Associated symptoms: no abdominal pain, no chest pain, no cough, no dizziness, no dysuria, no fever, no foul-smelling urine, no frequency, no headaches, no lethargy, no seizures, no vision change and no vomiting   Risk factors: congestive heart failure        Home Medications Prior to Admission medications   Medication Sig Start Date End Date Taking? Authorizing Provider  amLODipine (NORVASC) 10 MG tablet TAKE 1 TABLET(10 MG) BY MOUTH DAILY 02/08/23   Dorothyann Peng, MD  apixaban (ELIQUIS) 2.5 MG TABS tablet TAKE 1 TABLET(2.5 MG) BY MOUTH TWICE DAILY 07/25/23   Dorothyann Peng, MD  ascorbic acid (VITAMIN C) 500 MG tablet Take 1 tablet (500 mg total) by mouth daily. 08/15/20   Standley Brooking, MD  atorvastatin (LIPITOR) 20 MG tablet TAKE 1 TABLET(20 MG) BY MOUTH DAILY AT 6 PM FOR CHOLESTEROL Strength: 20 mg 04/19/22   Dorothyann Peng, MD  blood glucose meter kit and supplies KIT Dispense based on patient and insurance preference. Use up to four times daily as directed. (FOR ICD-9 250.00, 250.01). 08/20/21   Dorothyann Peng, MD  Blood Pressure Monitor KIT Use as directed to check blood pressure as needed 08/20/21   Dorothyann Peng, MD  cetirizine (ZYRTEC) 10 MG tablet Take 10 mg by mouth daily.    [provider]  donepezil (ARICEPT) 5 MG tablet TAKE 1 TABLET(5 MG) BY MOUTH AT BEDTIME 02/20/23   Dorothyann Peng, MD  glucose blood test strip Use as directed to check blood sugars 3 times per day dx: e11.65 06/09/20   Dorothyann Peng, MD  lanthanum (FOSRENOL) 1000 MG chewable tablet Chew 1,000 mg by mouth 3 (three) times daily with meals. Takes twice daily    [provider]  lidocaine (LMX) 4 % cream SMARTSIG:Sparingly Topical As Directed 07/16/21   [provider]  loratadine (CLARITIN) 10 MG tablet Take 10 mg by mouth daily.    [provider]  metoprolol tartrate (LOPRESSOR) 25 MG tablet TAKE 1/2 TABLET(12.5 MG) BY MOUTH TWICE DAILY 02/15/23   Dorothyann Peng, MD  polyethylene glycol (MIRALAX) 17 g packet Take 17 g by mouth daily as needed. 07/31/21   Burnadette Pop, MD  sevelamer (RENAGEL) 800 MG tablet Take by mouth 3 (three) times daily with meals.    [provider]  zinc sulfate 220 (50 Zn) MG capsule Take 1 capsule (220 mg total) by mouth daily. 08/15/20   Standley Brooking, MD      Allergies    Penicillins    Review of Systems   Review of Systems  Constitutional:  Positive for fatigue. Negative for chills and fever.  HENT:  Negative for congestion.   Respiratory:  Positive for shortness of breath. Negative for cough, chest tightness and wheezing.   Cardiovascular:  Positive for  leg swelling. Negative for chest pain and palpitations.  Gastrointestinal:  Positive for diarrhea and nausea. Negative for abdominal pain, constipation and vomiting.  Genitourinary:  Negative for dysuria and frequency.  Musculoskeletal:  Negative for back pain, neck pain and neck stiffness.  Skin:  Negative for rash.  Neurological:  Positive for weakness and light-headedness. Negative for dizziness, seizures and headaches.  Psychiatric/Behavioral:  Negative for agitation and confusion.   All other systems reviewed and are negative.   Physical Exam Updated Vital Signs BP (!) 135/41  (BP Location: Right Arm)   Pulse (!) 53   Temp 98.5 F (36.9 C)   Resp 16   Ht 4\' 11"  (1.499 m)   Wt 62 kg   SpO2 100%   BMI 27.61 kg/m  Physical Exam Vitals and nursing note reviewed.  Constitutional:      General: She is not in acute distress.    Appearance: She is well-developed. She is not ill-appearing, toxic-appearing or diaphoretic.  HENT:     Head: Normocephalic and atraumatic.     Mouth/Throat:     Mouth: Mucous membranes are moist.     Pharynx: No oropharyngeal exudate or posterior oropharyngeal erythema.  Eyes:     Extraocular Movements: Extraocular movements intact.     Conjunctiva/sclera: Conjunctivae normal.     Pupils: Pupils are equal, round, and reactive to light.  Cardiovascular:     Rate and Rhythm: Normal rate and regular rhythm.     Heart sounds: No murmur heard. Pulmonary:     Effort: Pulmonary effort is normal. No respiratory distress.     Breath sounds: Rales present. No wheezing or rhonchi.  Chest:     Chest wall: No tenderness.  Abdominal:     General: Abdomen is flat.     Palpations: Abdomen is soft.     Tenderness: There is no abdominal tenderness. There is no guarding or rebound.  Musculoskeletal:        General: No swelling or tenderness.     Cervical back: Neck supple.     Right lower leg: No edema.     Left lower leg: No edema.  Skin:    General: Skin is warm and dry.     Capillary Refill: Capillary refill takes less than 2 seconds.     Findings: No erythema or rash.  Neurological:     General: No focal deficit present.     Mental Status: She is alert.     Sensory: No sensory deficit.     Motor: No weakness.  Psychiatric:        Mood and Affect: Mood normal.     ED Results / Procedures / Treatments   Labs (all labs ordered are listed, but only abnormal results are displayed) Labs Reviewed  CBC WITH DIFFERENTIAL/PLATELET - Abnormal; Notable for the following components:      Result Value   Hemoglobin 10.1 (*)    HCT 30.9 (*)     MCV 78.4 (*)    MCH 25.6 (*)    RDW 19.5 (*)    nRBC 0.6 (*)    Lymphs Abs 0.6 (*)    All other components within normal limits  COMPREHENSIVE METABOLIC PANEL - Abnormal; Notable for the following components:   Sodium 129 (*)    Chloride 90 (*)    Glucose, Bld 170 (*)    BUN 74 (*)    Creatinine, Ser 6.96 (*)    Calcium 8.4 (*)    GFR, Estimated 5 (*)  Anion gap 16 (*)    All other components within normal limits  CBG MONITORING, ED - Abnormal; Notable for the following components:   Glucose-Capillary 159 (*)    All other components within normal limits  I-STAT CHEM 8, ED - Abnormal; Notable for the following components:   Sodium 127 (*)    Chloride 94 (*)    BUN 63 (*)    Creatinine, Ser 7.90 (*)    Glucose, Bld 164 (*)    Calcium, Ion 1.01 (*)    All other components within normal limits  LIPASE, BLOOD  TSH  HEPATITIS B SURFACE ANTIGEN  HEPATITIS B SURFACE ANTIBODY, QUANTITATIVE    EKG EKG Interpretation Date/Time:  Saturday October 14 2023 15:54:56 EST Ventricular Rate:  54 PR Interval:  104 QRS Duration:  80 QT Interval:  504 QTC Calculation: 477 R Axis:   12  Text Interpretation: Sinus bradycardia with short PR Anterolateral infarct , age undetermined T wave abnormality, consider inferior ischemia Abnormal ECG When compared with ECG of 17-Jul-2023 18:43, PREVIOUS ECG IS PRESENT when compared top rior, slower rate No STEMI Confirmed by Theda Belfast ([id number]) on 10/14/2023 4:42:24 PM  Radiology DG Chest 2 View Result Date: 10/14/2023 CLINICAL DATA:  Missed dialysis, shortness of breath. EXAM: CHEST - 2 VIEW COMPARISON:  Chest x-ray 07/17/2023 FINDINGS: Heart is enlarged. There central pulmonary vascular congestion. There are mild patchy opacities in the right mid lung/right lower lobe. There is no pleural effusion or pneumothorax. No acute fractures are identified. IMPRESSION: 1. Cardiomegaly with central pulmonary vascular congestion. 2. Mild patchy opacities in  the right mid lung/right lower lobe may represent atelectasis or infection. Electronically Signed   By: Darliss Cheney M.D.   On: 10/14/2023 17:15    Procedures Procedures    Medications Ordered in ED Medications  Chlorhexidine Gluconate Cloth 2 % PADS 6 each (has no administration in time range)    ED Course/ Medical Decision Making/ A&P                                 Medical Decision Making Amount and/or Complexity of Data Reviewed Labs: ordered. Radiology: ordered.    [name] [name] is a 88 y.o. female with a past medical history significant for ESRD on dialysis MWF, previous TIA, diabetes, previous encephalopathy, hypertension, hyperlipidemia, CHF, A-fib on Eliquis therapy, and dementia who presents for worsening fatigue, inability to ambulate, shortness of breath, and missing dialysis.  According to patient's family, for the last 4 days, patient has been more more fatigued.  She had some nausea and some diarrhea and missed dialysis on Wednesday and Friday due to feeling ill.  She also reportedly had a fall on Wednesday but is having no injuries or pain from the fall.  She is denying any chest pain but has shortness of breath when she tries to ambulate or do anything.  Normally she can ambulate well but cannot get up today.  She is reporting no current nausea or vomiting but still has some diarrhea.  She reports no urinary changes.  Denies any chest pain, palpitations or abdominal pain.  Denies any back or flank pain.  No headache or neck injury.  She reports worsening edema in her legs and her abdomen and feels consistent with fluid overload and now she is to edematous to be able to ambulate.  She was brought here for evaluation.  On exam, lungs have rales significant  in both lungs.  No wheezing or rhonchi.  Chest and abdomen nontender.  Bowel sounds are appreciated.  Legs are edematous bilaterally and family reports this is worse over the last few days.  No focal neurologic  deficits initially.  Patient otherwise resting.  Due to missing 2 dialysis treatments and her shortness of breath with any exertion, dissipate she will likely need dialysis.  Will get screening labs will get a chest x-ray due to her shortness of breath.  Will get EKG as well.  EKG does not show STEMI.  Anticipate getting workup and then discussing with nephrology plan for either dialysis and possibly home versus admission for further management.  6:18 PM Just spoke with nephrology who feels she needs dialysis in the emergency department.  They will help order dialysis and then she will come back for reassessment to determine if she is safe for discharge home or not.  If she is feeling better and is able to ambulate without significant shortness of breath, chest pain discharge, she cannot, anticipate admission.   Care will be transferred to oncoming team while she awaits going to dialysis and then return and reassessment.  Anticipate discharge if she is feeling better and fluid is taken off her successfully.          Final Clinical Impression(s) / ED Diagnoses Final diagnoses:  Generalized weakness  Shortness of breath  Dialysis patient [facility])    Clinical Impression: 1. Generalized weakness   2. Shortness of breath   3. Dialysis patient [facility])     Disposition: Patient will go to dialysis and then return for reassessment to determine if she is able to go home or needs to be admitted.  This note was prepared with assistance of Conservation officer, historic buildings. Occasional wrong-word or sound-a-like substitutions may have occurred due to the inherent limitations of voice recognition software.      Emmauel Hallums, Canary Brim, MD 10/14/23 2250

## 2023-10-14 NOTE — ED Triage Notes (Signed)
 Pt to ED from home via EMS c/o generalized weakness. Pt felt "hot, weak, & like was going to faint". VSS. Dialysis MTF, but didn't go Wed or Fri. +abdominal edema & b/l LE edema. A&O X 2. Does not know time.  +Dementia

## 2023-10-15 DIAGNOSIS — D631 Anemia in chronic kidney disease: Secondary | ICD-10-CM | POA: Diagnosis not present

## 2023-10-15 DIAGNOSIS — E8770 Fluid overload, unspecified: Secondary | ICD-10-CM | POA: Diagnosis not present

## 2023-10-15 DIAGNOSIS — Z794 Long term (current) use of insulin: Secondary | ICD-10-CM | POA: Diagnosis not present

## 2023-10-15 DIAGNOSIS — Z79899 Other long term (current) drug therapy: Secondary | ICD-10-CM | POA: Diagnosis not present

## 2023-10-15 DIAGNOSIS — Z66 Do not resuscitate: Secondary | ICD-10-CM | POA: Diagnosis not present

## 2023-10-15 DIAGNOSIS — I480 Paroxysmal atrial fibrillation: Secondary | ICD-10-CM | POA: Diagnosis not present

## 2023-10-15 DIAGNOSIS — R7989 Other specified abnormal findings of blood chemistry: Secondary | ICD-10-CM | POA: Diagnosis not present

## 2023-10-15 DIAGNOSIS — R570 Cardiogenic shock: Secondary | ICD-10-CM | POA: Diagnosis not present

## 2023-10-15 DIAGNOSIS — I5032 Chronic diastolic (congestive) heart failure: Secondary | ICD-10-CM | POA: Diagnosis not present

## 2023-10-15 DIAGNOSIS — Z992 Dependence on renal dialysis: Secondary | ICD-10-CM | POA: Diagnosis not present

## 2023-10-15 DIAGNOSIS — M069 Rheumatoid arthritis, unspecified: Secondary | ICD-10-CM | POA: Diagnosis not present

## 2023-10-15 DIAGNOSIS — I3139 Other pericardial effusion (noninflammatory): Secondary | ICD-10-CM | POA: Diagnosis not present

## 2023-10-15 DIAGNOSIS — E872 Acidosis, unspecified: Secondary | ICD-10-CM | POA: Diagnosis not present

## 2023-10-15 DIAGNOSIS — R0989 Other specified symptoms and signs involving the circulatory and respiratory systems: Secondary | ICD-10-CM | POA: Diagnosis not present

## 2023-10-15 DIAGNOSIS — E1122 Type 2 diabetes mellitus with diabetic chronic kidney disease: Secondary | ICD-10-CM | POA: Diagnosis not present

## 2023-10-15 DIAGNOSIS — R0602 Shortness of breath: Secondary | ICD-10-CM | POA: Diagnosis not present

## 2023-10-15 DIAGNOSIS — Z515 Encounter for palliative care: Secondary | ICD-10-CM | POA: Diagnosis not present

## 2023-10-15 DIAGNOSIS — I120 Hypertensive chronic kidney disease with stage 5 chronic kidney disease or end stage renal disease: Secondary | ICD-10-CM | POA: Diagnosis not present

## 2023-10-15 DIAGNOSIS — N2581 Secondary hyperparathyroidism of renal origin: Secondary | ICD-10-CM | POA: Diagnosis not present

## 2023-10-15 DIAGNOSIS — D509 Iron deficiency anemia, unspecified: Secondary | ICD-10-CM | POA: Diagnosis not present

## 2023-10-15 DIAGNOSIS — I213 ST elevation (STEMI) myocardial infarction of unspecified site: Secondary | ICD-10-CM | POA: Diagnosis not present

## 2023-10-15 DIAGNOSIS — I214 Non-ST elevation (NSTEMI) myocardial infarction: Secondary | ICD-10-CM | POA: Diagnosis not present

## 2023-10-15 DIAGNOSIS — K921 Melena: Secondary | ICD-10-CM | POA: Diagnosis not present

## 2023-10-15 DIAGNOSIS — Z7901 Long term (current) use of anticoagulants: Secondary | ICD-10-CM | POA: Diagnosis not present

## 2023-10-15 DIAGNOSIS — I132 Hypertensive heart and chronic kidney disease with heart failure and with stage 5 chronic kidney disease, or end stage renal disease: Secondary | ICD-10-CM | POA: Diagnosis not present

## 2023-10-15 DIAGNOSIS — Z8249 Family history of ischemic heart disease and other diseases of the circulatory system: Secondary | ICD-10-CM | POA: Diagnosis not present

## 2023-10-15 DIAGNOSIS — N186 End stage renal disease: Secondary | ICD-10-CM | POA: Diagnosis not present

## 2023-10-15 DIAGNOSIS — E785 Hyperlipidemia, unspecified: Secondary | ICD-10-CM | POA: Diagnosis not present

## 2023-10-15 LAB — HEPATITIS B SURFACE ANTIGEN: Hepatitis B Surface Ag: NONREACTIVE

## 2023-10-15 MED ORDER — LIDOCAINE-PRILOCAINE 2.5-2.5 % EX CREA: 1.0000 | TOPICAL_CREAM | CUTANEOUS | Status: DC | PRN

## 2023-10-15 MED ORDER — HEPARIN SODIUM (PORCINE) 1000 UNIT/ML DIALYSIS: 1000.0000 [IU] | INTRAMUSCULAR | Status: DC | PRN

## 2023-10-15 MED ORDER — NEPRO/CARBSTEADY PO LIQD: 237.0000 mL | ORAL | Status: DC | PRN

## 2023-10-15 MED ORDER — ANTICOAGULANT SODIUM CITRATE 4% (200MG/5ML) IV SOLN: 5.0000 mL | Status: DC | PRN

## 2023-10-15 MED ORDER — LIDOCAINE HCL (PF) 1 % IJ SOLN: 5.0000 mL | INTRAMUSCULAR | Status: DC | PRN

## 2023-10-15 MED ORDER — ALTEPLASE 2 MG IJ SOLR: 2.0000 mg | Freq: Once | INTRAMUSCULAR | Status: DC | PRN

## 2023-10-15 MED ORDER — PENTAFLUOROPROP-TETRAFLUOROETH EX AERO: 1.0000 | INHALATION_SPRAY | CUTANEOUS | Status: DC | PRN

## 2023-10-15 NOTE — Progress Notes (Signed)
 HD completed. Pt c.o of leg cramping during dialysis. Able to removed 3.2L of fluid net in 3.5 hours treatment duration. All blood returned safely. LUE AVF needles x2 de accessed. Manual pressure applied to sites until hemostasis was achieved. Sites secured with gauze/taped. Patient is alert and conversant. No signs of distress post HD. Handoff given to ED charge nurse Irving Burton.

## 2023-10-15 NOTE — ED Provider Notes (Signed)
 Patient re-evaluated after dialysis.  She states that she feels much better and needs a ride home.  Patient not hypoxic or short of breath.  She looks well.  Will plan for discharge home.   Roxy Horseman, PA-C 10/15/23 0544    Royanne Foots, DO 10/19/23 1440

## 2023-10-15 NOTE — Discharge Instructions (Signed)
Please return for new or worsening symptoms.

## 2023-10-15 NOTE — ED Notes (Signed)
 Attempted to ambulate pt in hallway, pt refusing when asked. Pt states she walks at home, but states she cannot walk when asked to. Says she "does not want to"

## 2023-10-16 DIAGNOSIS — E8770 Fluid overload, unspecified: Secondary | ICD-10-CM | POA: Diagnosis not present

## 2023-10-16 DIAGNOSIS — D631 Anemia in chronic kidney disease: Secondary | ICD-10-CM | POA: Diagnosis not present

## 2023-10-16 DIAGNOSIS — N2581 Secondary hyperparathyroidism of renal origin: Secondary | ICD-10-CM | POA: Diagnosis not present

## 2023-10-16 DIAGNOSIS — N186 End stage renal disease: Secondary | ICD-10-CM | POA: Diagnosis not present

## 2023-10-16 DIAGNOSIS — Z992 Dependence on renal dialysis: Secondary | ICD-10-CM | POA: Diagnosis not present

## 2023-10-16 LAB — HEPATITIS B SURFACE ANTIBODY, QUANTITATIVE: Hep B S AB Quant (Post): 1486 m[IU]/mL

## 2023-10-18 ENCOUNTER — Encounter (HOSPITAL_COMMUNITY): Payer: Self-pay | Admitting: Emergency Medicine

## 2023-10-18 ENCOUNTER — Emergency Department (HOSPITAL_COMMUNITY)

## 2023-10-18 DIAGNOSIS — Z7901 Long term (current) use of anticoagulants: Secondary | ICD-10-CM

## 2023-10-18 DIAGNOSIS — E1122 Type 2 diabetes mellitus with diabetic chronic kidney disease: Secondary | ICD-10-CM | POA: Diagnosis not present

## 2023-10-18 DIAGNOSIS — E872 Acidosis, unspecified: Secondary | ICD-10-CM | POA: Diagnosis present

## 2023-10-18 DIAGNOSIS — R000 Tachycardia, unspecified: Secondary | ICD-10-CM | POA: Diagnosis not present

## 2023-10-18 DIAGNOSIS — K921 Melena: Secondary | ICD-10-CM | POA: Diagnosis not present

## 2023-10-18 DIAGNOSIS — Z66 Do not resuscitate: Secondary | ICD-10-CM | POA: Diagnosis present

## 2023-10-18 DIAGNOSIS — I3139 Other pericardial effusion (noninflammatory): Secondary | ICD-10-CM | POA: Diagnosis not present

## 2023-10-18 DIAGNOSIS — N2581 Secondary hyperparathyroidism of renal origin: Secondary | ICD-10-CM | POA: Diagnosis present

## 2023-10-18 DIAGNOSIS — M069 Rheumatoid arthritis, unspecified: Secondary | ICD-10-CM | POA: Diagnosis present

## 2023-10-18 DIAGNOSIS — N186 End stage renal disease: Secondary | ICD-10-CM | POA: Diagnosis present

## 2023-10-18 DIAGNOSIS — R110 Nausea: Secondary | ICD-10-CM | POA: Diagnosis present

## 2023-10-18 DIAGNOSIS — I4891 Unspecified atrial fibrillation: Secondary | ICD-10-CM

## 2023-10-18 DIAGNOSIS — Z604 Social exclusion and rejection: Secondary | ICD-10-CM | POA: Diagnosis present

## 2023-10-18 DIAGNOSIS — R0602 Shortness of breath: Secondary | ICD-10-CM | POA: Diagnosis not present

## 2023-10-18 DIAGNOSIS — E785 Hyperlipidemia, unspecified: Secondary | ICD-10-CM | POA: Diagnosis present

## 2023-10-18 DIAGNOSIS — I5032 Chronic diastolic (congestive) heart failure: Secondary | ICD-10-CM | POA: Diagnosis present

## 2023-10-18 DIAGNOSIS — Z7189 Other specified counseling: Secondary | ICD-10-CM | POA: Diagnosis not present

## 2023-10-18 DIAGNOSIS — Z8673 Personal history of transient ischemic attack (TIA), and cerebral infarction without residual deficits: Secondary | ICD-10-CM

## 2023-10-18 DIAGNOSIS — F0390 Unspecified dementia without behavioral disturbance: Secondary | ICD-10-CM | POA: Diagnosis present

## 2023-10-18 DIAGNOSIS — I120 Hypertensive chronic kidney disease with stage 5 chronic kidney disease or end stage renal disease: Secondary | ICD-10-CM | POA: Diagnosis not present

## 2023-10-18 DIAGNOSIS — R001 Bradycardia, unspecified: Secondary | ICD-10-CM | POA: Diagnosis not present

## 2023-10-18 DIAGNOSIS — Z515 Encounter for palliative care: Secondary | ICD-10-CM | POA: Diagnosis not present

## 2023-10-18 DIAGNOSIS — Z992 Dependence on renal dialysis: Secondary | ICD-10-CM

## 2023-10-18 DIAGNOSIS — Z7982 Long term (current) use of aspirin: Secondary | ICD-10-CM

## 2023-10-18 DIAGNOSIS — Z794 Long term (current) use of insulin: Secondary | ICD-10-CM

## 2023-10-18 DIAGNOSIS — D631 Anemia in chronic kidney disease: Secondary | ICD-10-CM | POA: Diagnosis present

## 2023-10-18 DIAGNOSIS — R197 Diarrhea, unspecified: Secondary | ICD-10-CM | POA: Diagnosis present

## 2023-10-18 DIAGNOSIS — R570 Cardiogenic shock: Secondary | ICD-10-CM | POA: Diagnosis present

## 2023-10-18 DIAGNOSIS — Z8249 Family history of ischemic heart disease and other diseases of the circulatory system: Secondary | ICD-10-CM

## 2023-10-18 DIAGNOSIS — Z90710 Acquired absence of both cervix and uterus: Secondary | ICD-10-CM

## 2023-10-18 DIAGNOSIS — M1990 Unspecified osteoarthritis, unspecified site: Secondary | ICD-10-CM | POA: Diagnosis present

## 2023-10-18 DIAGNOSIS — I10 Essential (primary) hypertension: Secondary | ICD-10-CM | POA: Diagnosis not present

## 2023-10-18 DIAGNOSIS — G4733 Obstructive sleep apnea (adult) (pediatric): Secondary | ICD-10-CM | POA: Diagnosis present

## 2023-10-18 DIAGNOSIS — I132 Hypertensive heart and chronic kidney disease with heart failure and with stage 5 chronic kidney disease, or end stage renal disease: Secondary | ICD-10-CM | POA: Diagnosis present

## 2023-10-18 DIAGNOSIS — I509 Heart failure, unspecified: Secondary | ICD-10-CM | POA: Diagnosis not present

## 2023-10-18 DIAGNOSIS — Z880 Allergy status to penicillin: Secondary | ICD-10-CM

## 2023-10-18 DIAGNOSIS — R531 Weakness: Secondary | ICD-10-CM | POA: Diagnosis present

## 2023-10-18 DIAGNOSIS — D509 Iron deficiency anemia, unspecified: Secondary | ICD-10-CM | POA: Diagnosis present

## 2023-10-18 DIAGNOSIS — Z91158 Patient's noncompliance with renal dialysis for other reason: Secondary | ICD-10-CM

## 2023-10-18 DIAGNOSIS — R627 Adult failure to thrive: Secondary | ICD-10-CM | POA: Diagnosis present

## 2023-10-18 DIAGNOSIS — I480 Paroxysmal atrial fibrillation: Secondary | ICD-10-CM | POA: Diagnosis present

## 2023-10-18 DIAGNOSIS — R9431 Abnormal electrocardiogram [ECG] [EKG]: Secondary | ICD-10-CM | POA: Diagnosis not present

## 2023-10-18 DIAGNOSIS — Z96641 Presence of right artificial hip joint: Secondary | ICD-10-CM | POA: Diagnosis present

## 2023-10-18 DIAGNOSIS — Z79899 Other long term (current) drug therapy: Secondary | ICD-10-CM | POA: Diagnosis not present

## 2023-10-18 DIAGNOSIS — R7989 Other specified abnormal findings of blood chemistry: Secondary | ICD-10-CM | POA: Diagnosis present

## 2023-10-18 DIAGNOSIS — I213 ST elevation (STEMI) myocardial infarction of unspecified site: Secondary | ICD-10-CM | POA: Diagnosis not present

## 2023-10-18 DIAGNOSIS — Z823 Family history of stroke: Secondary | ICD-10-CM

## 2023-10-18 DIAGNOSIS — I214 Non-ST elevation (NSTEMI) myocardial infarction: Secondary | ICD-10-CM | POA: Diagnosis not present

## 2023-10-18 DIAGNOSIS — R0989 Other specified symptoms and signs involving the circulatory and respiratory systems: Secondary | ICD-10-CM | POA: Diagnosis not present

## 2023-10-18 DIAGNOSIS — I445 Left posterior fascicular block: Secondary | ICD-10-CM | POA: Diagnosis present

## 2023-10-18 DIAGNOSIS — I959 Hypotension, unspecified: Secondary | ICD-10-CM | POA: Diagnosis not present

## 2023-10-18 DIAGNOSIS — Z6828 Body mass index (BMI) 28.0-28.9, adult: Secondary | ICD-10-CM

## 2023-10-18 DIAGNOSIS — Z833 Family history of diabetes mellitus: Secondary | ICD-10-CM

## 2023-10-18 LAB — APTT: aPTT: 31 s (ref 24–36)

## 2023-10-18 LAB — ECHOCARDIOGRAM COMPLETE
AR max vel: 1.92 cm2
AV Area VTI: 2.25 cm2
AV Area mean vel: 2 cm2
AV Mean grad: 4 mmHg
AV Peak grad: 7.3 mmHg
Ao pk vel: 1.35 m/s
Area-P 1/2: 2.42 cm2
S' Lateral: 2.9 cm

## 2023-10-18 LAB — HEPATITIS B SURFACE ANTIGEN: Hepatitis B Surface Ag: NONREACTIVE

## 2023-10-18 LAB — CBC
HCT: 34.8 % — ABNORMAL LOW (ref 36.0–46.0)
Hemoglobin: 11 g/dL — ABNORMAL LOW (ref 12.0–15.0)
MCH: 25 pg — ABNORMAL LOW (ref 26.0–34.0)
MCHC: 31.6 g/dL (ref 30.0–36.0)
MCV: 79.1 fL — ABNORMAL LOW (ref 80.0–100.0)
Platelets: 216 10*3/uL (ref 150–400)
RBC: 4.4 MIL/uL (ref 3.87–5.11)
RDW: 19.5 % — ABNORMAL HIGH (ref 11.5–15.5)
WBC: 8 10*3/uL (ref 4.0–10.5)
nRBC: 2.2 % — ABNORMAL HIGH (ref 0.0–0.2)

## 2023-10-18 LAB — BASIC METABOLIC PANEL
Anion gap: 19 — ABNORMAL HIGH (ref 5–15)
BUN: 46 mg/dL — ABNORMAL HIGH (ref 8–23)
CO2: 22 mmol/L (ref 22–32)
Calcium: 8.9 mg/dL (ref 8.9–10.3)
Chloride: 93 mmol/L — ABNORMAL LOW (ref 98–111)
Creatinine, Ser: 5.76 mg/dL — ABNORMAL HIGH (ref 0.44–1.00)
GFR, Estimated: 7 mL/min — ABNORMAL LOW (ref >60)
Glucose, Bld: 214 mg/dL — ABNORMAL HIGH (ref 70–99)
Potassium: 4.2 mmol/L (ref 3.5–5.1)
Sodium: 134 mmol/L — ABNORMAL LOW (ref 135–145)

## 2023-10-18 LAB — TROPONIN I (HIGH SENSITIVITY)
Troponin I (High Sensitivity): 863 ng/L (ref <18)
Troponin I (High Sensitivity): 882 ng/L (ref <18)

## 2023-10-18 LAB — GLUCOSE, CAPILLARY: Glucose-Capillary: 174 mg/dL — ABNORMAL HIGH (ref 70–99)

## 2023-10-18 MED ORDER — BISACODYL 5 MG PO TBEC: 5.0000 mg | DELAYED_RELEASE_TABLET | Freq: Every day | ORAL | Status: DC | PRN

## 2023-10-18 MED ORDER — ACETAMINOPHEN 650 MG RE SUPP: 650.0000 mg | Freq: Four times a day (QID) | RECTAL | Status: DC | PRN

## 2023-10-18 MED ORDER — ACETAMINOPHEN 325 MG PO TABS: 650.0000 mg | ORAL_TABLET | Freq: Four times a day (QID) | ORAL | Status: DC | PRN

## 2023-10-18 MED ORDER — POLYETHYLENE GLYCOL 3350 17 G PO PACK: 17.0000 g | PACK | Freq: Every day | ORAL | Status: DC | PRN

## 2023-10-18 MED ORDER — ALBUTEROL SULFATE (2.5 MG/3ML) 0.083% IN NEBU: 2.5000 mg | INHALATION_SOLUTION | Freq: Four times a day (QID) | RESPIRATORY_TRACT | Status: DC | PRN

## 2023-10-18 MED ORDER — CHLORHEXIDINE GLUCONATE CLOTH 2 % EX PADS
6.0000 | MEDICATED_PAD | Freq: Every day | CUTANEOUS | Status: DC
  Administered 2023-10-19 – 2023-10-22 (×4): 6 via TOPICAL

## 2023-10-18 MED ORDER — HEPARIN (PORCINE) 25000 UT/250ML-% IV SOLN
1000.0000 [IU]/h | INTRAVENOUS | Status: DC
  Administered 2023-10-18: 800 [IU]/h via INTRAVENOUS
  Administered 2023-10-19: 1100 [IU]/h via INTRAVENOUS
  Filled 2023-10-18 (×2): qty 250

## 2023-10-18 MED ORDER — HYDRALAZINE HCL 20 MG/ML IJ SOLN: 10.0000 mg | Freq: Four times a day (QID) | INTRAMUSCULAR | Status: DC | PRN

## 2023-10-18 MED ORDER — METOPROLOL TARTRATE 12.5 MG HALF TABLET
12.5000 mg | ORAL_TABLET | Freq: Two times a day (BID) | ORAL | Status: DC
  Administered 2023-10-18 – 2023-10-21 (×7): 12.5 mg via ORAL
  Filled 2023-10-18 (×7): qty 1

## 2023-10-18 NOTE — ED Triage Notes (Signed)
 Pt BIB [facility] from home due generalized weakness this morning.  Pt reports no chest pain nor shortness of breath.  Pt missed dialysis all last week and today she was supposed to go but was too weak to get into the car.  Questionable AVL depression.  VS BP 132/58, HR 70, CBG 202, SpO2 96% RA

## 2023-10-18 NOTE — ED Notes (Signed)
 Echo at bedside

## 2023-10-18 NOTE — Progress Notes (Signed)
  Echocardiogram 2D Echocardiogram has been performed.  [name] [name] 10/18/2023, 3:27 PM

## 2023-10-18 NOTE — Progress Notes (Signed)
 88 year old female with hypertension, hyperlipidemia, ESRD on hemodialysis, paroxysmal A-fib, ?h/o TIA, rheumatoid arthritis OSA, dementia, with abnormal EKG  Patient called EMS with generalized weakness complaints.  She was seen in the ER with similar complaints, in addition to diarrhea on 10/14/2023, and has missed dialysis. She denies any complains of chest pain or shortness of breath at rest this time.  EKG from today personally reviewed and compared with EKG on 10/14/2023. EKG today shows junctional rhythm, inferior Q waves, inferolateral subtle ST elevation, minimal lateral ST depression. Q waves are new today since previous EKG on 3/8.  Physical exam unremakable, no rales.   It is possible that she may have had an infarct between 3/8 and today. She currently denies any chest pain or dyspnea, comfortable on room air. With absence of clear angina symptoms, presence of Q waves suggesting age indeterminate infarct, I do not recommend activating code STEMI. I do expect troponin to be elevated if she has recently had an MI, but important to know if the trend is uprising or not. Recommend echocardiogram, serial EKGs and troponin. Hold DOAC, recommend IV heparin.  Elder Negus, MD

## 2023-10-18 NOTE — Consult Note (Signed)
 Cardiology Consultation   Patient ID: [name] [name] MRN: [id number]; DOB: Dec 18, 1935  Admit date: 10/18/2023 Date of Consult: 10/18/2023  PCP:  Dorothyann Peng, MD   [facility] Providers Cardiologist:  None        Patient Profile:   [name] [name] is a 88 y.o. female with a hx of CKD currently on dialysis, hypertension, hyperlipidemia, atrial fibrillation on Eliquis, obstructive sleep apnea, past TIA, chronic diastolic heart failure, rheumatoid arthritis who is being seen 10/18/2023 for the evaluation of an abnormal EKG at the request of Jacalyn Lefevre MD.  History of Present Illness:   [name] [name] has the above medical history. Cardiology was previously consulted to see on 07/2020 for new onset atrial fibrillation at that time her CHA2DS2-VASc was 7.  She was rate controlled with Cardizem and amiodarone.  Was started on DOAC prior to discharge.  Is currently on Eliquis 2.5 mg due to end-stage renal disease and an age of 74.  Home meds also include Lipitor 20 mg daily and Norvasc 10 mg daily.  Most recent echo was done on 07/2020.  It showed an LVEF of 55 to 60%, no regional wall motion abnormalities, grade 1 diastolic dysfunction, elevated left ventricular end-diastolic pressures, and mild LVH.   She previously presented to the emergency department on 10/14/2023 for generalized weakness.  At that time it was suspected that the cause of the weakness was missing dialysis.  She was reevaluated after getting dialysis and felt much better and decided to go home.    She and presented to the emergency department this time for worsening fatigue and shortness of breath. Is currently no longer able to ambulate due to the fatigue.  Also reported missing 2 dialysis treatments.  Associated symptoms include nausea, and diarrhea.  Nephrology was consulted and they are planning to do dialysis.    The prior EKG that was done on 10/14/2023 showed inferior lateral T wave inversions that  were not present on prior EKG on 07/2023. An EKG was done today showed Q waves in lead III and aVF.  The Q waves are present in prior EKGs on lead III.  Q waves on aVF are new.  She is in normal sinus rhythm with a short PR interval.  With a rate of 74. Labs in the emergency department showed a microcytic anemia with a hemoglobin of 11.0 and MCV of 79.1.  This appears to be improved from baseline of about 9.0.  She had a blood pressure of 134/56 and a heart rate of 71 at 215 pm.  Troponins came back elevated at 863, chem panel showed sodium of 134 potassium of 4.2, chloride of 93, anion gap of 19, and  glucose 214.  On physical exam reported worsening shortness of breath and fatigue for about the past 6 days.  The shortness of breath is worse with exertion and she is no longer able to walk or take care of herself. Also reported having a cough for the past few days, diaphoresis, worsening confusion. denied chest pain, jaw pain, current shortness of breath.  Denies any past history of tobacco use, alcohol use, or any other illicit substance.  Father had a stroke at age 82 and passed away from it.  The patient's mother and 3 siblings have a history of diabetes.  Past Medical History:  Diagnosis Date   Arthritis    RA   CHF (congestive heart failure) (HCC)    CKD (chronic kidney disease) stage 3, GFR 30-59  ml/min (HCC)    M-W-F dialysis   Dementia (HCC)    Diabetes mellitus without complication (HCC)    insulin dependent   Hyperlipidemia    Hypertension    Seasonal allergies    Shortness of breath dyspnea    occasional with exertion - no oxygen   Sleep apnea    does not use cpap   TIA (transient ischemic attack) 2015    Past Surgical History:  Procedure Laterality Date   ABDOMINAL HYSTERECTOMY  1974   AV FISTULA PLACEMENT Left 12/10/2020   Procedure: INSERTION OF ARTERIOVENOUS (AV) GORE-TEX GRAFT LEFT ARM;  Surgeon: Nada Libman, MD;  Location: [facility] OR;  Service: Vascular;  Laterality:  Left;   COLONOSCOPY     EYE SURGERY Bilateral    cataracts removed   IR FLUORO GUIDE CV LINE RIGHT  07/29/2020   IR FLUORO GUIDE CV LINE RIGHT  08/04/2020   IR US GUIDE VASC ACCESS RIGHT  07/29/2020   MULTIPLE TOOTH EXTRACTIONS     TOTAL HIP ARTHROPLASTY Right 07/23/2021   Procedure: TOTAL HIP ARTHROPLASTY ANTERIOR APPROACH;  Surgeon: Samson Frederic, MD;  Location: [facility] OR;  Service: Orthopedics;  Laterality: Right;       Inpatient Medications: Scheduled Meds:  Continuous Infusions:  PRN Meds:   Allergies:    Allergies  Allergen Reactions   Penicillins Swelling and Rash    Social History:   Social History   Socioeconomic History   Marital status: Widowed    Spouse name: Not on file   Number of children: 6   Years of education: 12th   Highest education level: Not on file  Occupational History   Occupation: retired    Associate Professor: RETIRED  Tobacco Use   Smoking status: Never   Smokeless tobacco: Never  Vaping Use   Vaping status: Never Used  Substance and Sexual Activity   Alcohol use: No    Alcohol/week: 0.0 standard drinks of alcohol   Drug use: No   Sexual activity: Not Currently    Birth control/protection: Surgical    Comment: Hysterectomy  Other Topics Concern   Not on file  Social History Narrative   Patient lives with her husband    Patient is right handed   Patient drinks tea   Social Drivers of Corporate investment banker Strain: Low Risk  (06/08/2023)   Overall Financial Resource Strain (CARDIA)    Difficulty of Paying Living Expenses: Not hard at all  Food Insecurity: No Food Insecurity (06/08/2023)   Hunger Vital Sign    Worried About Running Out of Food in the Last Year: Never true    Ran Out of Food in the Last Year: Never true  Transportation Needs: No Transportation Needs (06/08/2023)   PRAPARE - Administrator, Civil Service (Medical): No    Lack of Transportation (Non-Medical): No  Physical Activity: Inactive (06/08/2023)    Exercise Vital Sign    Days of Exercise per Week: 0 days    Minutes of Exercise per Session: 0 min  Stress: No Stress Concern Present (06/08/2023)   [employer] of Occupational Health - Occupational Stress Questionnaire    Feeling of Stress : Not at all  Social Connections: Socially Isolated (06/08/2023)   Social Connection and Isolation Panel [NHANES]    Frequency of Communication with Friends and Family: Never    Frequency of Social Gatherings with Friends and Family: More than three times a week    Attends Religious Services: Never  Active Member of Clubs or Organizations: No    Attends Banker Meetings: Never    Marital Status: Widowed  Intimate Partner Violence: Not At Risk (06/08/2023)   Humiliation, Afraid, Rape, and Kick questionnaire    Fear of Current or Ex-Partner: No    Emotionally Abused: No    Physically Abused: No    Sexually Abused: No    Family History:    Family History  Problem Relation Age of Onset   Diabetes type II Mother    Hypertension Mother    Diabetes type II Father    Hypertension Other    Hyperlipidemia Other    Stroke Other      ROS:  Please see the history of present illness.   All other ROS reviewed and negative.     Physical Exam/Data:   Vitals:   10/18/23 1239  BP: (!) 124/59  Pulse: 74  Resp: (!) 22  Temp: 98.6 F (37 C)  TempSrc: Oral  SpO2: 95%   No intake or output data in the 24 hours ending 10/18/23 1249    10/15/2023    3:10 AM 10/14/2023   10:15 PM 10/14/2023    3:52 PM  Last 3 Weights  Weight (lbs) 130 lb 1.1 oz 136 lb 11 oz 136 lb 11 oz  Weight (kg) 59 kg 62 kg 62 kg     There is no height or weight on file to calculate BMI.  General: A malnourished female appearing stated age HEENT: normal Neck: no JVD Vascular:  Distal pulses 2+ on right arm. Non palpable on left. Had difficulty palpating dorsalis pedis pulses. Cardiac:  normal S1, S2; RRR; no murmur  Lungs:  clear to auscultation  bilaterally, no wheezing, rhonchi or rales  Ext: 2+ edema Musculoskeletal:  No deformities, BUE and BLE strength normal and equal Skin: warm and dry  Neuro:  CNs 2-12 intact, no focal abnormalities noted Psych:  Normal affect   EKG:  The EKG was personally reviewed and demonstrates:  An EKG was done today showed Q waves in lead III and aVF.  The Q waves are present in prior EKGs on lead III.  Q waves on aVF are new.  She is in normal sinus rhythm with a short PR interval.  With a rate of 74. Telemetry:  Telemetry was personally reviewed and demonstrates:    Relevant CV Studies: Prior echo in 2021 IMPRESSIONS     1. Left ventricular ejection fraction, by estimation, is 55 to 60%. The  left ventricle has normal function. The left ventricle has no regional  wall motion abnormalities. There is mild concentric left ventricular  hypertrophy. Left ventricular diastolic  parameters are consistent with Grade I diastolic dysfunction (impaired  relaxation). Elevated left ventricular end-diastolic pressure.   2. Right ventricular systolic function is normal. The right ventricular  size is normal. There is mildly elevated pulmonary artery systolic  pressure.   3. The mitral valve is normal in structure. Trivial mitral valve  regurgitation. No evidence of mitral stenosis.   4. The aortic valve is normal in structure. Aortic valve regurgitation is  not visualized. No aortic stenosis is present.   5. The inferior vena cava is normal in size with greater than 50%  respiratory variability, suggesting right atrial pressure of 3 mmHg.   FINDINGS   Left Ventricle: Left ventricular ejection fraction, by estimation, is 55  to 60%. The left ventricle has normal function. The left ventricle has no  regional wall motion  abnormalities. The left ventricular internal cavity  size was normal in size. There is   mild concentric left ventricular hypertrophy. Left ventricular diastolic  parameters are consistent  with Grade I diastolic dysfunction (impaired  relaxation). Elevated left ventricular end-diastolic pressure.   Right Ventricle: The right ventricular size is normal. No increase in  right ventricular wall thickness. Right ventricular systolic function is  normal. There is mildly elevated pulmonary artery systolic pressure. The  tricuspid regurgitant velocity is 3.23   m/s, and with an assumed right atrial pressure of 3 mmHg, the estimated  right ventricular systolic pressure is 44.7 mmHg.   Left Atrium: Left atrial size was normal in size.   Right Atrium: Right atrial size was normal in size.   Pericardium: There is no evidence of pericardial effusion.   Mitral Valve: The mitral valve is normal in structure. Trivial mitral  valve regurgitation. No evidence of mitral valve stenosis.   Tricuspid Valve: The tricuspid valve is normal in structure. Tricuspid  valve regurgitation is trivial. No evidence of tricuspid stenosis.   Aortic Valve: The aortic valve is normal in structure. Aortic valve  regurgitation is not visualized. No aortic stenosis is present. Aortic  valve peak gradient measures 9.2 mmHg.   Pulmonic Valve: The pulmonic valve was normal in structure. Pulmonic valve  regurgitation is not visualized. No evidence of pulmonic stenosis.   Aorta: The aortic root is normal in size and structure.   Venous: The inferior vena cava is normal in size with greater than 50%  respiratory variability, suggesting right atrial pressure of 3 mmHg.   IAS/Shunts: No atrial level shunt detected by color flow Doppler  Laboratory Data:  High Sensitivity Troponin:  No results for input(s): "TROPONINIHS" in the last 720 hours.   Chemistry Recent Labs  Lab 10/14/23 1714 10/14/23 1719  NA 129* 127*  K 4.7 4.7  CL 90* 94*  CO2 23  --   GLUCOSE 170* 164*  BUN 74* 63*  CREATININE 6.96* 7.90*  CALCIUM 8.4*  --   GFRNONAA 5*  --   ANIONGAP 16*  --     Recent Labs  Lab 10/14/23 1714   PROT 6.8  ALBUMIN 3.6  AST 30  ALT 39  ALKPHOS 76  BILITOT 0.7   Lipids No results for input(s): "CHOL", "TRIG", "HDL", "LABVLDL", "LDLCALC", "CHOLHDL" in the last 168 hours.  Hematology Recent Labs  Lab 10/14/23 1714 10/14/23 1719  WBC 4.9  --   RBC 3.94  --   HGB 10.1* 12.2  HCT 30.9* 36.0  MCV 78.4*  --   MCH 25.6*  --   MCHC 32.7  --   RDW 19.5*  --   PLT 198  --    Thyroid  Recent Labs  Lab 10/14/23 1714  TSH 0.768    BNPNo results for input(s): "BNP", "PROBNP" in the last 168 hours.  DDimer No results for input(s): "DDIMER" in the last 168 hours.   Radiology/Studies:  DG Chest 2 View Result Date: 10/14/2023 CLINICAL DATA:  Missed dialysis, shortness of breath. EXAM: CHEST - 2 VIEW COMPARISON:  Chest x-ray 07/17/2023 FINDINGS: Heart is enlarged. There central pulmonary vascular congestion. There are mild patchy opacities in the right mid lung/right lower lobe. There is no pleural effusion or pneumothorax. No acute fractures are identified. IMPRESSION: 1. Cardiomegaly with central pulmonary vascular congestion. 2. Mild patchy opacities in the right mid lung/right lower lobe may represent atelectasis or infection. Electronically Signed   By: Darliss Cheney  M.D.   On: 10/14/2023 17:15     Assessment and Plan:   Abnormal Prior EKG The prior EKG that was done on 10/14/2023 showed inferior lateral T wave inversions that were not present on prior EKG on 07/2023. An EKG was done today showed Q waves in lead III and aVF.  The Q waves are present in prior EKGs on lead III.  Q waves on aVF are new.  She is in normal sinus rhythm with a short PR interval.  With a rate of 74. -Dr. Rosemary Holms evaluated and decided not to call code STEMI due to lack of anginal symptoms and Q waves on EKG.  He concluded that she likely had an infarct between 3/8 and today -High-sensitivity troponins were elevated at 863.  Denies having chest pain or jaw pain.  -Continue to trend troponin -Order  echo - Ordered repeat EKG -Further ischemic evaluation recommendations pending findings from above. -Stop Eliquis 2.5 mg twice daily - Start IV heparin.  History of Atrial fibrillation CHA2DS2-VASc: 7 (prior TIA 2, age 36, sex 1, diabetes 1, HTN 1, CHF 1) -Was initially found on 07/2020.  She required controlling medications and was started on a DOAC prior to discharge. Has not been followed by cardiology since. - At home was on Eliquis 2.5 mg twice daily due to end-stage renal disease and age 71. -Stop Eliquis 2.5 mg twice daily - Start IV heparin. - Order echo.  Hypertension -Continue home amlodipine 10 mg daily. -Blood pressure is currently normotensive.  Hyperlipidemia -Continue home Lipitor 20 mg daily  Type 2 diabetes - Management per primary  End-stage renal disease requiring dialysis - Management per nephrology  Obstructive sleep apnea -Order CPAP for inpatient -Management per primary  History of TIA -Noted on 01/11/2014 -Continue home Lipitor 20 mg daily  Risk Assessment/Risk Scores:           For questions or updates, please contact [facility] Please consult [email or web address] for contact info under    Signed, Arabella Merles, PA-C  10/18/2023 12:49 PM

## 2023-10-18 NOTE — Progress Notes (Signed)
 PHARMACY - ANTICOAGULATION CONSULT NOTE  Pharmacy Consult for heparin  Indication: atrial fibrillation  Allergies  Allergen Reactions   Penicillins Swelling and Rash    Patient Measurements:   Heparin Dosing Weight: 56.4kg   Vital Signs: Temp: 98.7 F (37.1 C) (03/12 1335) Temp Source: Oral (03/12 1239) BP: 128/60 (03/12 1345) Pulse Rate: 78 (03/12 1300)  Labs: Recent Labs    10/18/23 1242  HGB 11.0*  HCT 34.8*  PLT 216  CREATININE 5.76*  TROPONINIHS 863*    Estimated Creatinine Clearance: 5.6 mL/min (A) (by C-G formula based on SCr of 5.76 mg/dL (H)).   Medical History: Past Medical History:  Diagnosis Date   Arthritis    RA   CHF (congestive heart failure) (HCC)    CKD (chronic kidney disease) stage 3, GFR 30-59 ml/min (HCC)    M-W-F dialysis   Dementia (HCC)    Diabetes mellitus without complication (HCC)    insulin dependent   Hyperlipidemia    Hypertension    Seasonal allergies    Shortness of breath dyspnea    occasional with exertion - no oxygen   Sleep apnea    does not use cpap   TIA (transient ischemic attack) 2015   Assessment: Patient presenting with CC of weakness following missed dialysis sessions.  PMH includes Afib on Eliquis PTA, last dose 3/11 PM. HgB 11.0 and PLTS 216.   Goal of Therapy:  Heparin level 0.3-0.7 units/ml aPTT 66-102 Monitor platelets by anticoagulation protocol: Yes   Plan:  No bolus due to recent DOAC intake. Start heparin infusion at 800 units/hr Check anti-Xa level in 8 hours and daily while on heparin, will utilize aPTT for now. Anti-Xa will be falsely elevated.  Continue to monitor H&H and platelets  Estill Batten, PharmD, BCCCP  10/18/2023,2:14 PM

## 2023-10-18 NOTE — H&P (Signed)
 [facility] History and Physical  [name] [name] ONG:[id number] DOB: 1935/11/22 DOA: 10/18/2023 PCP: Dorothyann Peng, MD  Presented from: Home Chief Complaint: Weakness  History of Present Illness: [name] [name] is a 88 y.o. female with PMH significant for ESRD HD MWF, DM2, HTN, HLD, OSA, CHF, paroxysmal A-fib, TIA, arthritis Patient was brought to the ED from home by EMS today for generalized weakness.    3/8, patient was seen in the ED for diarrhea because of which she got weak and missed her 1-2 dialysis sessions.  She had pulm overload and nephrology did dialysis in the ED.  She felt much better after that and was hence discharged to continue MWF dialysis as an outpatient. It seems patient continued to remain weak at home and was not able to resume dialysis as an outpatient.  Today, she tried to go but was too weak to get into the car, EMS was called and patient was brought to ED.  In the ED, patient was afebrile, hemodynamically stable Labs showed WBC count of 8, hemoglobin 11, BUN/creatinine 46/5.76, troponin elevated to 863.   Chest x-ray normal.  She denies any chest pain or shortness of breath but EKG was concerning.   Cardiology was consulted. EKG today showed junctional rhythm, inferior Q waves, inferolateral subtle ST elevation.  In comparison to the EKG from 3/8, Q waves were new. Per cardiology, patient could have had an infarct between 3/8 and today.  IV heparin and echocardiogram was recommended. Hospital service was consulted for inpatient management.  At the time of my evaluation, patient was alert, awake, slow to respond.  Not in distress.  No family at bedside. Does not seem to be in volume overload status.   Review of Systems:  All systems were reviewed and were negative unless otherwise mentioned in the HPI   Past medical history: Past Medical History:  Diagnosis Date   Arthritis    RA   CHF (congestive heart failure) (HCC)    CKD  (chronic kidney disease) stage 3, GFR 30-59 ml/min (HCC)    M-W-F dialysis   Dementia (HCC)    Diabetes mellitus without complication (HCC)    insulin dependent   Hyperlipidemia    Hypertension    Seasonal allergies    Shortness of breath dyspnea    occasional with exertion - no oxygen   Sleep apnea    does not use cpap   TIA (transient ischemic attack) 2015    Past surgical history: Past Surgical History:  Procedure Laterality Date   ABDOMINAL HYSTERECTOMY  1974   AV FISTULA PLACEMENT Left 12/10/2020   Procedure: INSERTION OF ARTERIOVENOUS (AV) GORE-TEX GRAFT LEFT ARM;  Surgeon: Nada Libman, MD;  Location: [facility] OR;  Service: Vascular;  Laterality: Left;   COLONOSCOPY     EYE SURGERY Bilateral    cataracts removed   IR FLUORO GUIDE CV LINE RIGHT  07/29/2020   IR FLUORO GUIDE CV LINE RIGHT  08/04/2020   IR US GUIDE VASC ACCESS RIGHT  07/29/2020   MULTIPLE TOOTH EXTRACTIONS     TOTAL HIP ARTHROPLASTY Right 07/23/2021   Procedure: TOTAL HIP ARTHROPLASTY ANTERIOR APPROACH;  Surgeon: Samson Frederic, MD;  Location: [facility] OR;  Service: Orthopedics;  Laterality: Right;    Social History:  reports that she has never smoked. She has never used smokeless tobacco. She reports that she does not drink alcohol and does not use drugs.  Allergies:  Allergies  Allergen Reactions   Penicillins Swelling and Rash  Penicillins   Family history:  Family History  Problem Relation Age of Onset   Diabetes type II Mother    Hypertension Mother    Diabetes type II Father    Hypertension Other    Hyperlipidemia Other    Stroke Other      Physical Exam: Vitals:   10/18/23 1345 10/18/23 1415 10/18/23 1430 10/18/23 1445  BP: 128/60 (!) 134/56 125/65 (!) 136/56  Pulse:      Resp: (!) 22 (!) 22 (!) 21 17  Temp:      TempSrc:      SpO2:       Wt Readings from Last 3 Encounters:  10/15/23 59 kg  07/18/23 61.5 kg  06/08/23 64 kg   There is no height or weight on file to calculate  BMI.  General exam: Pleasant, elderly African-American female Skin: No rashes, lesions or ulcers. HEENT: Atraumatic, normocephalic, no obvious bleeding Lungs: Clear to auscultation bilaterally,  CVS: S1, S2, no murmur,   GI/Abd: Soft, nontender, nondistended, bowel sound present,   CNS: Alert, awake, slow to respond, oriented to place. Psychiatry: Mood appropriate,  Extremities: No pedal edema, no calf tenderness,    ----------------------------------------------------------------------------------------------------------------------------------------- ----------------------------------------------------------------------------------------------------------------------------------------- -----------------------------------------------------------------------------------------------------------------------------------------  Assessment/Plan: Principal Problem:   Elevated troponin Active Problems:   PAF (paroxysmal atrial fibrillation) (HCC)   Abnormal EKG  Generalized weakness In the setting of recent diarrhea, possible coronary event and missed dialysis. Management of individual issues below PT eval ordered  Abnormal EKG EKG today showed junctional rhythm, inferior Q waves, inferolateral subtle ST elevation.  In comparison to the EKG from 3/8, Q waves were new. Per cardiology, patient could have had an infarct between 3/8 and today.   Troponin elevated to 863 IV heparin and echocardiogram was recommended. Eliquis on hold. Recent Labs    10/18/23 1242 10/18/23 1442  TROPONINIHS 863* 882*   ESRD HD MWF Last dialysis on 3/8 Does not look volume this time.  Potassium normal Nephrology consulted for maintenance dialysis  Recent diarrhea A week ago, patient was in the ED diarrhea.  Patient did not give any details of her status now. Monitor diarrhea loss  Type 2 diabetes mellitus A1c 5.9 on 05/21/2023 PTA meds-not on meds Start SSI/Accu-Cheks Recent Labs  Lab  10/14/23 1543  GLUCAP 159*   CHF  Hypertension PTA meds- amlodipine 10 mg daily, metoprolol 12.5 mL daily Resume metoprolol.  Keep amlodipine on hold.  Continue to monitor  Paroxysmal A-fib Resume metoprolol.  Eliquis on hold while on heparin drip  TIA HLD Eliquis, Lipitor  OSA Not using CPAP??  Arthritis Tylenol as needed  Home med list has not been obtained/updated by [facility] at the time of admission.  Please double check admission reconciliation.   Mobility: Encourage ambulation  Goals of care:   Code Status: Full Code    DVT prophylaxis: Heparin drip    Antimicrobials: None Fluid: None Consultants: Cardiology, nephrology Family Communication: None at bedside  Dispo: The patient is from: Home              Anticipated d/c is to: Pending course  Diet: Diet Order             Diet renal with fluid restriction Fluid restriction: 1200 mL Fluid; Room service appropriate? Yes; Fluid consistency: Thin  Diet effective now                    ------------------------------------------------------------------------------------- Severity of Illness: The appropriate patient status for this patient is INPATIENT. Inpatient  status is judged to be reasonable and necessary in order to provide the required intensity of service to ensure the patient's safety. The patient's presenting symptoms, physical exam findings, and initial radiographic and laboratory data in the context of their chronic comorbidities is felt to place them at high risk for further clinical deterioration. Furthermore, it is not anticipated that the patient will be medically stable for discharge from the hospital within 2 midnights of admission.   * I certify that at the point of admission it is my clinical judgment that the patient will require inpatient hospital care spanning beyond 2 midnights from the point of admission due to high intensity of service, high risk for further deterioration and high  frequency of surveillance required.* -------------------------------------------------------------------------------------   Home Meds: Prior to Admission medications   Medication Sig Start Date End Date Taking? Authorizing Provider  amLODipine (NORVASC) 10 MG tablet TAKE 1 TABLET(10 MG) BY MOUTH DAILY 02/08/23   Dorothyann Peng, MD  apixaban (ELIQUIS) 2.5 MG TABS tablet TAKE 1 TABLET(2.5 MG) BY MOUTH TWICE DAILY 07/25/23   Dorothyann Peng, MD  ascorbic acid (VITAMIN C) 500 MG tablet Take 1 tablet (500 mg total) by mouth daily. 08/15/20   Standley Brooking, MD  atorvastatin (LIPITOR) 20 MG tablet TAKE 1 TABLET(20 MG) BY MOUTH DAILY AT 6 PM FOR CHOLESTEROL Strength: 20 mg 04/19/22   Dorothyann Peng, MD  blood glucose meter kit and supplies KIT Dispense based on patient and insurance preference. Use up to four times daily as directed. (FOR ICD-9 250.00, 250.01). 08/20/21   Dorothyann Peng, MD  Blood Pressure Monitor KIT Use as directed to check blood pressure as needed 08/20/21   Dorothyann Peng, MD  cetirizine (ZYRTEC) 10 MG tablet Take 10 mg by mouth daily.    [provider]  donepezil (ARICEPT) 5 MG tablet TAKE 1 TABLET(5 MG) BY MOUTH AT BEDTIME 02/20/23   Dorothyann Peng, MD  glucose blood test strip Use as directed to check blood sugars 3 times per day dx: e11.65 06/09/20   Dorothyann Peng, MD  lanthanum (FOSRENOL) 1000 MG chewable tablet Chew 1,000 mg by mouth 3 (three) times daily with meals. Takes twice daily    [provider]  lidocaine (LMX) 4 % cream SMARTSIG:Sparingly Topical As Directed 07/16/21   [provider]  loratadine (CLARITIN) 10 MG tablet Take 10 mg by mouth daily.    [provider]  metoprolol tartrate (LOPRESSOR) 25 MG tablet TAKE 1/2 TABLET(12.5 MG) BY MOUTH TWICE DAILY 02/15/23   Dorothyann Peng, MD  polyethylene glycol (MIRALAX) 17 g packet Take 17 g by mouth daily as needed. 07/31/21   Burnadette Pop, MD  sevelamer (RENAGEL) 800 MG tablet Take by  mouth 3 (three) times daily with meals.    [provider]  zinc sulfate 220 (50 Zn) MG capsule Take 1 capsule (220 mg total) by mouth daily. 08/15/20   Standley Brooking, MD    Labs on Admission:   CBC: Recent Labs  Lab 10/14/23 1714 10/14/23 1719 10/18/23 1242  WBC 4.9  --  8.0  NEUTROABS 4.0  --   --   HGB 10.1* 12.2 11.0*  HCT 30.9* 36.0 34.8*  MCV 78.4*  --  79.1*  PLT 198  --  216    Basic Metabolic Panel: Recent Labs  Lab 10/14/23 1714 10/14/23 1719 10/18/23 1242  NA 129* 127* 134*  K 4.7 4.7 4.2  CL 90* 94* 93*  CO2 23  --  22  GLUCOSE 170* 164* 214*  BUN 74* 63* 46*  CREATININE 6.96* 7.90* 5.76*  CALCIUM 8.4*  --  8.9    Liver Function Tests: Recent Labs  Lab 10/14/23 1714  AST 30  ALT 39  ALKPHOS 76  BILITOT 0.7  PROT 6.8  ALBUMIN 3.6   Recent Labs  Lab 10/14/23 1714  LIPASE 47   No results for input(s): "AMMONIA" in the last 168 hours.  Cardiac Enzymes: No results for input(s): "CKTOTAL", "CKMB", "CKMBINDEX", "TROPONINI" in the last 168 hours.  BNP (last 3 results) No results for input(s): "BNP" in the last 8760 hours.  ProBNP (last 3 results) No results for input(s): "PROBNP" in the last 8760 hours.  CBG: Recent Labs  Lab 10/14/23 1543  GLUCAP 159*    Lipase     Component Value Date/Time   LIPASE 47 10/14/2023 1714     Urinalysis    Component Value Date/Time   COLORURINE YELLOW 12/18/2021 1856   APPEARANCEUR CLOUDY (A) 12/18/2021 1856   LABSPEC 1.015 12/18/2021 1856   PHURINE 6.0 12/18/2021 1856   GLUCOSEU NEGATIVE 12/18/2021 1856   HGBUR NEGATIVE 12/18/2021 1856   BILIRUBINUR NEGATIVE 12/18/2021 1856   BILIRUBINUR negative 01/30/2020 1652   KETONESUR NEGATIVE 12/18/2021 1856   PROTEINUR 100 (A) 12/18/2021 1856   UROBILINOGEN 0.2 01/30/2020 1652   UROBILINOGEN 0.2 05/25/2015 2112   NITRITE NEGATIVE 12/18/2021 1856   LEUKOCYTESUR LARGE (A) 12/18/2021 1856     Drugs of Abuse     Component Value  Date/Time   LABOPIA NONE DETECTED 12/18/2021 1856   COCAINSCRNUR NONE DETECTED 12/18/2021 1856   LABBENZ NONE DETECTED 12/18/2021 1856   AMPHETMU NONE DETECTED 12/18/2021 1856   THCU POSITIVE (A) 12/18/2021 1856   LABBARB NONE DETECTED 12/18/2021 1856      Radiological Exams on Admission: ECHOCARDIOGRAM COMPLETE Result Date: 10/18/2023    ECHOCARDIOGRAM REPORT   Patient Name:   Connelly CRAWFORD Brightbill Date of Exam: 10/18/2023 Medical Rec #:  [id number]              Height:       59.0 in Accession #:    [id number]             Weight:       130.1 lb Date of Birth:  19-Sep-1935              BSA:          1.536 m Patient Age:    87 years               BP:           134/56 mmHg Patient Gender: F                      HR:           64 bpm. Exam Location:  Inpatient Procedure: 2D Echo, Cardiac Doppler and Color Doppler (Both Spectral and Color            Flow Doppler were utilized during procedure). Indications:    AFIB I48.91  History:        Patient has prior history of Echocardiogram examinations, most                 recent 07/30/2020. CHF, Arrythmias:Atrial Fibrillation,                 Signs/Symptoms:Shortness of Breath; Risk Factors:Hypertension,  Diabetes and Dyslipidemia.  Sonographer:    Rosaland Lao Sonographer#2:  Darlys Gales Referring Phys: [id number] [facility]  Sonographer Comments: Suboptimal apical window. IMPRESSIONS  1. Left ventricular ejection fraction, by estimation, is 55 to 60%. The left ventricle has normal function. The left ventricle has no regional wall motion abnormalities. Left ventricular diastolic parameters are consistent with Grade I diastolic dysfunction (impaired relaxation).  2. D-shaped septum suggestive of RV pressure/volume overload. Right ventricular systolic function is moderately reduced. The right ventricular size is moderately enlarged. There is normal pulmonary artery systolic pressure. The estimated right ventricular systolic pressure is 20.4 mmHg.  3.  Left atrial size was mild to moderately dilated.  4. Right atrial size was mildly dilated.  5. A small pericardial effusion is present.  6. The mitral valve is normal in structure. No evidence of mitral valve regurgitation. No evidence of mitral stenosis. Moderate mitral annular calcification.  7. Tricuspid valve coaptation looks incomplete. The tricuspid valve is abnormal. Tricuspid valve regurgitation is moderate to severe.  8. The aortic valve is tricuspid. There is mild calcification of the aortic valve. Aortic valve regurgitation is not visualized. No aortic stenosis is present.  9. The inferior vena cava is dilated in size with >50% respiratory variability, suggesting right atrial pressure of 8 mmHg. FINDINGS  Left Ventricle: Left ventricular ejection fraction, by estimation, is 55 to 60%. The left ventricle has normal function. The left ventricle has no regional wall motion abnormalities. The left ventricular internal cavity size was normal in size. There is  no left ventricular hypertrophy. Left ventricular diastolic parameters are consistent with Grade I diastolic dysfunction (impaired relaxation). Right Ventricle: D-shaped septum suggestive of RV pressure/volume overload. The right ventricular size is moderately enlarged. No increase in right ventricular wall thickness. Right ventricular systolic function is moderately reduced. There is normal pulmonary artery systolic pressure. The tricuspid regurgitant velocity is 1.76 m/s, and with an assumed right atrial pressure of 8 mmHg, the estimated right ventricular systolic pressure is 20.4 mmHg. Left Atrium: Left atrial size was mild to moderately dilated. Right Atrium: Right atrial size was mildly dilated. Pericardium: A small pericardial effusion is present. Mitral Valve: The mitral valve is normal in structure. There is mild thickening of the mitral valve leaflet(s). Moderate mitral annular calcification. No evidence of mitral valve regurgitation. No  evidence of mitral valve stenosis. Tricuspid Valve: Tricuspid valve coaptation looks incomplete. The tricuspid valve is abnormal. Tricuspid valve regurgitation is moderate to severe. Aortic Valve: The aortic valve is tricuspid. There is mild calcification of the aortic valve. Aortic valve regurgitation is not visualized. No aortic stenosis is present. Aortic valve mean gradient measures 4.0 mmHg. Aortic valve peak gradient measures 7.3 mmHg. Aortic valve area, by VTI measures 2.25 cm. Pulmonic Valve: The pulmonic valve was normal in structure. Pulmonic valve regurgitation is not visualized. Aorta: The aortic root is normal in size and structure. Venous: The inferior vena cava is dilated in size with greater than 50% respiratory variability, suggesting right atrial pressure of 8 mmHg. IAS/Shunts: No atrial level shunt detected by color flow Doppler.  LEFT VENTRICLE PLAX 2D LVIDd:         4.10 cm   Diastology LVIDs:         2.90 cm   LV e' medial:    5.44 cm/s LV PW:         1.20 cm   LV E/e' medial:  11.6 LV IVS:        1.10 cm  LV e' lateral:   8.27 cm/s LVOT diam:     1.70 cm   LV E/e' lateral: 7.6 LV SV:         47 LV SV Index:   30 LVOT Area:     2.27 cm  RIGHT VENTRICLE RV S prime:     7.83 cm/s TAPSE (M-mode): 1.2 cm LEFT ATRIUM             Index        RIGHT ATRIUM           Index LA diam:        2.90 cm 1.89 cm/m   RA Area:     21.20 cm LA Vol (A2C):   41.0 ml 26.69 ml/m  RA Volume:   74.00 ml  48.17 ml/m LA Vol (A4C):   72.3 ml 47.07 ml/m LA Biplane Vol: 58.2 ml 37.89 ml/m  AORTIC VALVE AV Area (Vmax):    1.92 cm AV Area (Vmean):   2.00 cm AV Area (VTI):     2.25 cm AV Vmax:           135.00 cm/s AV Vmean:          92.700 cm/s AV VTI:            0.208 m AV Peak Grad:      7.3 mmHg AV Mean Grad:      4.0 mmHg LVOT Vmax:         114.00 cm/s LVOT Vmean:        81.700 cm/s LVOT VTI:          0.206 m LVOT/AV VTI ratio: 0.99  AORTA Ao Root diam: 2.80 cm Ao Asc diam:  3.40 cm MITRAL VALVE                TRICUSPID VALVE MV Area (PHT): 2.42 cm    TR Peak grad:   12.4 mmHg MV Decel Time: 313 msec    TR Vmax:        176.00 cm/s MV E velocity: 63.20 cm/s MV A velocity: 73.10 cm/s  SHUNTS MV E/A ratio:  0.86        Systemic VTI:  0.21 m                            Systemic Diam: 1.70 cm Dalton McleanMD Electronically signed by Wilfred Lacy Signature Date/Time: 10/18/2023/3:30:45 PM    Final    DG Chest Port 1 View Result Date: 10/18/2023 CLINICAL DATA:  Shortness of breath. EXAM: PORTABLE CHEST 1 VIEW COMPARISON:  10/14/2023. FINDINGS: Low lung volume. Bilateral lung fields are clear. Bilateral costophrenic angles are clear. Stable cardio-mediastinal silhouette. No acute osseous abnormalities. The soft tissues are within normal limits. IMPRESSION: No active disease. Electronically Signed   By: Jules Schick M.D.   On: 10/18/2023 15:29     Signed, Lorin Glass, MD [facility] 10/18/2023

## 2023-10-18 NOTE — ED Provider Notes (Signed)
 [place] [facility] AT [facility] Provider Note   CSN: [id number] Arrival date & time: 10/18/23  1236     History  No chief complaint on file.   Nikkia Devoss Bradeen is a 88 y.o. female.  Pt is a 88 yo female with pmhx significant for HLD, HTN, TIA, afib (eliquis), CHF, RA, DM, dementia and ESRD on HD (MWF).  Pt missed dialysis last week and today, family was getting hear ready to go, but she was too weak to get into the car.  Pt was here on 3/8 and received dialysis and was d/c on 3/9.  Pt's EKG was abn for EMS with ST elevations, but she had Q waves.  I told EMS to hold off on activation until she arrived as she was not having CP.  STEMI team recommended not activating STEMI as they think she completed infarct.  Pt is a poor historian.       Home Medications Prior to Admission medications   Medication Sig Start Date End Date Taking? Authorizing Provider  amLODipine (NORVASC) 10 MG tablet TAKE 1 TABLET(10 MG) BY MOUTH DAILY 02/08/23   Dorothyann Peng, MD  apixaban (ELIQUIS) 2.5 MG TABS tablet TAKE 1 TABLET(2.5 MG) BY MOUTH TWICE DAILY 07/25/23   Dorothyann Peng, MD  ascorbic acid (VITAMIN C) 500 MG tablet Take 1 tablet (500 mg total) by mouth daily. 08/15/20   Standley Brooking, MD  atorvastatin (LIPITOR) 20 MG tablet TAKE 1 TABLET(20 MG) BY MOUTH DAILY AT 6 PM FOR CHOLESTEROL Strength: 20 mg 04/19/22   Dorothyann Peng, MD  blood glucose meter kit and supplies KIT Dispense based on patient and insurance preference. Use up to four times daily as directed. (FOR ICD-9 250.00, 250.01). 08/20/21   Dorothyann Peng, MD  Blood Pressure Monitor KIT Use as directed to check blood pressure as needed 08/20/21   Dorothyann Peng, MD  cetirizine (ZYRTEC) 10 MG tablet Take 10 mg by mouth daily.    [provider]  donepezil (ARICEPT) 5 MG tablet TAKE 1 TABLET(5 MG) BY MOUTH AT BEDTIME 02/20/23   Dorothyann Peng, MD  glucose blood test strip Use as directed to check blood sugars  3 times per day dx: e11.65 06/09/20   Dorothyann Peng, MD  lanthanum (FOSRENOL) 1000 MG chewable tablet Chew 1,000 mg by mouth 3 (three) times daily with meals. Takes twice daily    [provider]  lidocaine (LMX) 4 % cream SMARTSIG:Sparingly Topical As Directed 07/16/21   [provider]  loratadine (CLARITIN) 10 MG tablet Take 10 mg by mouth daily.    [provider]  metoprolol tartrate (LOPRESSOR) 25 MG tablet TAKE 1/2 TABLET(12.5 MG) BY MOUTH TWICE DAILY 02/15/23   Dorothyann Peng, MD  polyethylene glycol (MIRALAX) 17 g packet Take 17 g by mouth daily as needed. 07/31/21   Burnadette Pop, MD  sevelamer (RENAGEL) 800 MG tablet Take by mouth 3 (three) times daily with meals.    [provider]  zinc sulfate 220 (50 Zn) MG capsule Take 1 capsule (220 mg total) by mouth daily. 08/15/20   Standley Brooking, MD      Allergies    Penicillins    Review of Systems   Review of Systems  Neurological:  Positive for weakness.  All other systems reviewed and are negative.   Physical Exam Updated Vital Signs BP (!) 136/56   Pulse 78   Temp 98.7 F (37.1 C)   Resp 17   SpO2 97%  Physical Exam Vitals and nursing note reviewed.  Constitutional:      Appearance: Normal appearance.  HENT:     Head: Normocephalic and atraumatic.     Right Ear: External ear normal.     Left Ear: External ear normal.     Nose: Nose normal.     Mouth/Throat:     Mouth: Mucous membranes are moist.     Pharynx: Oropharynx is clear.  Eyes:     Extraocular Movements: Extraocular movements intact.     Conjunctiva/sclera: Conjunctivae normal.     Pupils: Pupils are equal, round, and reactive to light.  Cardiovascular:     Rate and Rhythm: Normal rate and regular rhythm.     Pulses: Normal pulses.     Heart sounds: Normal heart sounds.  Pulmonary:     Effort: Pulmonary effort is normal.     Breath sounds: Normal breath sounds.  Abdominal:     General: Abdomen is flat. Bowel  sounds are normal.     Palpations: Abdomen is soft.  Musculoskeletal:        General: Normal range of motion.     Cervical back: Normal range of motion and neck supple.  Skin:    General: Skin is warm.     Capillary Refill: Capillary refill takes less than 2 seconds.  Neurological:     General: No focal deficit present.     Mental Status: She is alert and oriented to person, place, and time.  Psychiatric:        Mood and Affect: Mood normal.        Behavior: Behavior normal.     ED Results / Procedures / Treatments   Labs (all labs ordered are listed, but only abnormal results are displayed) Labs Reviewed  BASIC METABOLIC PANEL - Abnormal; Notable for the following components:      Result Value   Sodium 134 (*)    Chloride 93 (*)    Glucose, Bld 214 (*)    BUN 46 (*)    Creatinine, Ser 5.76 (*)    GFR, Estimated 7 (*)    Anion gap 19 (*)    All other components within normal limits  CBC - Abnormal; Notable for the following components:   Hemoglobin 11.0 (*)    HCT 34.8 (*)    MCV 79.1 (*)    MCH 25.0 (*)    RDW 19.5 (*)    nRBC 2.2 (*)    All other components within normal limits  TROPONIN I (HIGH SENSITIVITY) - Abnormal; Notable for the following components:   Troponin I (High Sensitivity) 863 (*)    All other components within normal limits  APTT  TROPONIN I (HIGH SENSITIVITY)    EKG EKG Interpretation Date/Time:  Wednesday October 18 2023 12:40:29 EDT Ventricular Rate:  74 PR Interval:    QRS Duration:  90 QT Interval:  382 QTC Calculation: 424 R Axis:   113  Text Interpretation: Accelerated junctional rhythm Left posterior fascicular block  New elevations inferiorally and lat Confirmed by Jacalyn Lefevre [phone]) on 10/18/2023 1:27:00 PM  Radiology ECHOCARDIOGRAM COMPLETE Result Date: 10/18/2023    ECHOCARDIOGRAM REPORT   Patient Name:   Denice CRAWFORD Perrault Date of Exam: 10/18/2023 Medical Rec #:  [id number]              Height:       59.0 in Accession #:     [id number]  Weight:       130.1 lb Date of Birth:  [birth year]              BSA:          1.536 m Patient Age:    87 years               BP:           134/56 mmHg Patient Gender: F                      HR:           64 bpm. Exam Location:  Inpatient Procedure: 2D Echo, Cardiac Doppler and Color Doppler (Both Spectral and Color            Flow Doppler were utilized during procedure). Indications:    AFIB I48.91  History:        Patient has prior history of Echocardiogram examinations, most                 recent 07/30/2020. CHF, Arrythmias:Atrial Fibrillation,                 Signs/Symptoms:Shortness of Breath; Risk Factors:Hypertension,                 Diabetes and Dyslipidemia.  Sonographer:    Rosaland Lao Sonographer#2:  Darlys Gales Referring Phys: [id number] [facility]  Sonographer Comments: Suboptimal apical window. IMPRESSIONS  1. Left ventricular ejection fraction, by estimation, is 55 to 60%. The left ventricle has normal function. The left ventricle has no regional wall motion abnormalities. Left ventricular diastolic parameters are consistent with Grade I diastolic dysfunction (impaired relaxation).  2. D-shaped septum suggestive of RV pressure/volume overload. Right ventricular systolic function is moderately reduced. The right ventricular size is moderately enlarged. There is normal pulmonary artery systolic pressure. The estimated right ventricular systolic pressure is 20.4 mmHg.  3. Left atrial size was mild to moderately dilated.  4. Right atrial size was mildly dilated.  5. A small pericardial effusion is present.  6. The mitral valve is normal in structure. No evidence of mitral valve regurgitation. No evidence of mitral stenosis. Moderate mitral annular calcification.  7. Tricuspid valve coaptation looks incomplete. The tricuspid valve is abnormal. Tricuspid valve regurgitation is moderate to severe.  8. The aortic valve is tricuspid. There is mild calcification of the aortic valve.  Aortic valve regurgitation is not visualized. No aortic stenosis is present.  9. The inferior vena cava is dilated in size with >50% respiratory variability, suggesting right atrial pressure of 8 mmHg. FINDINGS  Left Ventricle: Left ventricular ejection fraction, by estimation, is 55 to 60%. The left ventricle has normal function. The left ventricle has no regional wall motion abnormalities. The left ventricular internal cavity size was normal in size. There is  no left ventricular hypertrophy. Left ventricular diastolic parameters are consistent with Grade I diastolic dysfunction (impaired relaxation). Right Ventricle: D-shaped septum suggestive of RV pressure/volume overload. The right ventricular size is moderately enlarged. No increase in right ventricular wall thickness. Right ventricular systolic function is moderately reduced. There is normal pulmonary artery systolic pressure. The tricuspid regurgitant velocity is 1.76 m/s, and with an assumed right atrial pressure of 8 mmHg, the estimated right ventricular systolic pressure is 20.4 mmHg. Left Atrium: Left atrial size was mild to moderately dilated. Right Atrium: Right atrial size was mildly dilated. Pericardium: A small pericardial effusion is present. Mitral Valve: The mitral valve is normal  in structure. There is mild thickening of the mitral valve leaflet(s). Moderate mitral annular calcification. No evidence of mitral valve regurgitation. No evidence of mitral valve stenosis. Tricuspid Valve: Tricuspid valve coaptation looks incomplete. The tricuspid valve is abnormal. Tricuspid valve regurgitation is moderate to severe. Aortic Valve: The aortic valve is tricuspid. There is mild calcification of the aortic valve. Aortic valve regurgitation is not visualized. No aortic stenosis is present. Aortic valve mean gradient measures 4.0 mmHg. Aortic valve peak gradient measures 7.3 mmHg. Aortic valve area, by VTI measures 2.25 cm. Pulmonic Valve: The pulmonic  valve was normal in structure. Pulmonic valve regurgitation is not visualized. Aorta: The aortic root is normal in size and structure. Venous: The inferior vena cava is dilated in size with greater than 50% respiratory variability, suggesting right atrial pressure of 8 mmHg. IAS/Shunts: No atrial level shunt detected by color flow Doppler.  LEFT VENTRICLE PLAX 2D LVIDd:         4.10 cm   Diastology LVIDs:         2.90 cm   LV e' medial:    5.44 cm/s LV PW:         1.20 cm   LV E/e' medial:  11.6 LV IVS:        1.10 cm   LV e' lateral:   8.27 cm/s LVOT diam:     1.70 cm   LV E/e' lateral: 7.6 LV SV:         47 LV SV Index:   30 LVOT Area:     2.27 cm  RIGHT VENTRICLE RV S prime:     7.83 cm/s TAPSE (M-mode): 1.2 cm LEFT ATRIUM             Index        RIGHT ATRIUM           Index LA diam:        2.90 cm 1.89 cm/m   RA Area:     21.20 cm LA Vol (A2C):   41.0 ml 26.69 ml/m  RA Volume:   74.00 ml  48.17 ml/m LA Vol (A4C):   72.3 ml 47.07 ml/m LA Biplane Vol: 58.2 ml 37.89 ml/m  AORTIC VALVE AV Area (Vmax):    1.92 cm AV Area (Vmean):   2.00 cm AV Area (VTI):     2.25 cm AV Vmax:           135.00 cm/s AV Vmean:          92.700 cm/s AV VTI:            0.208 m AV Peak Grad:      7.3 mmHg AV Mean Grad:      4.0 mmHg LVOT Vmax:         114.00 cm/s LVOT Vmean:        81.700 cm/s LVOT VTI:          0.206 m LVOT/AV VTI ratio: 0.99  AORTA Ao Root diam: 2.80 cm Ao Asc diam:  3.40 cm MITRAL VALVE               TRICUSPID VALVE MV Area (PHT): 2.42 cm    TR Peak grad:   12.4 mmHg MV Decel Time: 313 msec    TR Vmax:        176.00 cm/s MV E velocity: 63.20 cm/s MV A velocity: 73.10 cm/s  SHUNTS MV E/A ratio:  0.86        Systemic VTI:  0.21 m                            Systemic Diam: 1.70 cm Dalton McleanMD Electronically signed by Wilfred Lacy Signature Date/Time: 10/18/2023/3:30:45 PM    Final    DG Chest Port 1 View Result Date: 10/18/2023 CLINICAL DATA:  Shortness of breath. EXAM: PORTABLE CHEST 1 VIEW COMPARISON:   10/14/2023. FINDINGS: Low lung volume. Bilateral lung fields are clear. Bilateral costophrenic angles are clear. Stable cardio-mediastinal silhouette. No acute osseous abnormalities. The soft tissues are within normal limits. IMPRESSION: No active disease. Electronically Signed   By: Jules Schick M.D.   On: 10/18/2023 15:29    Procedures Procedures    Medications Ordered in ED Medications  heparin ADULT infusion 100 units/mL (25000 units/227mL) (800 Units/hr Intravenous New Bag/Given 10/18/23 1522)  acetaminophen (TYLENOL) tablet 650 mg (has no administration in time range)    Or  acetaminophen (TYLENOL) suppository 650 mg (has no administration in time range)  polyethylene glycol (MIRALAX / GLYCOLAX) packet 17 g (has no administration in time range)  bisacodyl (DULCOLAX) EC tablet 5 mg (has no administration in time range)  albuterol (PROVENTIL) (2.5 MG/3ML) 0.083% nebulizer solution 2.5 mg (has no administration in time range)  hydrALAZINE (APRESOLINE) injection 10 mg (has no administration in time range)    ED Course/ Medical Decision Making/ A&P                                 Medical Decision Making Amount and/or Complexity of Data Reviewed Labs: ordered. Radiology: ordered.  Risk Prescription drug management. Decision regarding hospitalization.   This patient presents to the ED for concern of weakness, this involves an extensive number of treatment options, and is a complaint that carries with it a high risk of complications and morbidity.  The differential diagnosis includes stemi, electrolyte abn, infection   Co morbidities that complicate the patient evaluation  HLD, HTN, TIA, afib (eliquis), CHF, RA, DM, dementia and ESRD on HD (MWF)   Additional history obtained:  Additional history obtained from epic chart review External records from outside source obtained and reviewed including EMS report   Lab Tests:  I Ordered, and personally interpreted labs.  The  pertinent results include:  cbc with hgb 11 (stable); bmp with glucose elevated at 214, bun 46 and cr 5.76; trop elevated at 863   Imaging Studies ordered:  I ordered imaging studies including cxr  I independently visualized and interpreted imaging which showed No active disease.  I agree with the radiologist interpretation   Cardiac Monitoring:  The patient was maintained on a cardiac monitor.  I personally viewed and interpreted the cardiac monitored which showed an underlying rhythm of: nsr   Medicines ordered and prescription drug management:   I have reviewed the patients home medicines and have made adjustments as needed   Consultations Obtained:  I requested consultation with the cardiologist (Dr. Rosemary Holms),  and discussed lab and imaging findings as well as pertinent plan - he recommends admission to medicine  Pt d/w Dr. Pola Corn (triad) for admission.   Problem List / ED Course:  NSTEMI:  IV heparin started.  Cards consulted. ESRD on HD:  last dialysis was 3/8.     Reevaluation:  After the interventions noted above, I reevaluated the patient and found that they have :improved   Social Determinants of Health:  Lives at  home   Dispostion:  After consideration of the diagnostic results and the patients response to treatment, I feel that the patent would benefit from admission. CRITICAL CARE Performed by: Jacalyn Lefevre   Total critical care time: 30 minutes  Critical care time was exclusive of separately billable procedures and treating other patients.  Critical care was necessary to treat or prevent imminent or life-threatening deterioration.  Critical care was time spent personally by me on the following activities: development of treatment plan with patient and/or surrogate as well as nursing, discussions with consultants, evaluation of patient's response to treatment, examination of patient, obtaining history from patient or surrogate, ordering and  performing treatments and interventions, ordering and review of laboratory studies, ordering and review of radiographic studies, pulse oximetry and re-evaluation of patient's condition.           Final Clinical Impression(s) / ED Diagnoses Final diagnoses:  NSTEMI (non-ST elevated myocardial infarction) (HCC)  ESRD (end stage renal disease) on dialysis [facility])    Rx / DC Orders ED Discharge Orders     None         Jacalyn Lefevre, MD 10/18/23 1559

## 2023-10-18 NOTE — ED Notes (Signed)
ECHO in room.

## 2023-10-19 ENCOUNTER — Other Ambulatory Visit: Payer: Self-pay

## 2023-10-19 DIAGNOSIS — N186 End stage renal disease: Secondary | ICD-10-CM

## 2023-10-19 DIAGNOSIS — Z992 Dependence on renal dialysis: Secondary | ICD-10-CM

## 2023-10-19 DIAGNOSIS — I480 Paroxysmal atrial fibrillation: Secondary | ICD-10-CM

## 2023-10-19 DIAGNOSIS — R7989 Other specified abnormal findings of blood chemistry: Secondary | ICD-10-CM | POA: Diagnosis not present

## 2023-10-19 DIAGNOSIS — I214 Non-ST elevation (NSTEMI) myocardial infarction: Secondary | ICD-10-CM | POA: Diagnosis not present

## 2023-10-19 LAB — BASIC METABOLIC PANEL
Anion gap: 17 — ABNORMAL HIGH (ref 5–15)
BUN: 56 mg/dL — ABNORMAL HIGH (ref 8–23)
CO2: 22 mmol/L (ref 22–32)
Calcium: 8.8 mg/dL — ABNORMAL LOW (ref 8.9–10.3)
Chloride: 95 mmol/L — ABNORMAL LOW (ref 98–111)
Creatinine, Ser: 6.54 mg/dL — ABNORMAL HIGH (ref 0.44–1.00)
GFR, Estimated: 6 mL/min — ABNORMAL LOW (ref >60)
Glucose, Bld: 183 mg/dL — ABNORMAL HIGH (ref 70–99)
Potassium: 4.3 mmol/L (ref 3.5–5.1)
Sodium: 134 mmol/L — ABNORMAL LOW (ref 135–145)

## 2023-10-19 LAB — CBC
HCT: 32.6 % — ABNORMAL LOW (ref 36.0–46.0)
Hemoglobin: 10.6 g/dL — ABNORMAL LOW (ref 12.0–15.0)
MCH: 25.2 pg — ABNORMAL LOW (ref 26.0–34.0)
MCHC: 32.5 g/dL (ref 30.0–36.0)
MCV: 77.4 fL — ABNORMAL LOW (ref 80.0–100.0)
Platelets: 209 10*3/uL (ref 150–400)
RBC: 4.21 MIL/uL (ref 3.87–5.11)
RDW: 19.4 % — ABNORMAL HIGH (ref 11.5–15.5)
WBC: 7.2 10*3/uL (ref 4.0–10.5)
nRBC: 4.6 % — ABNORMAL HIGH (ref 0.0–0.2)

## 2023-10-19 LAB — TROPONIN I (HIGH SENSITIVITY)
Troponin I (High Sensitivity): 782 ng/L (ref <18)
Troponin I (High Sensitivity): 788 ng/L (ref <18)
Troponin I (High Sensitivity): 824 ng/L (ref <18)

## 2023-10-19 LAB — HEPARIN LEVEL (UNFRACTIONATED): Heparin Unfractionated: 0.97 [IU]/mL — ABNORMAL HIGH (ref 0.30–0.70)

## 2023-10-19 LAB — GLUCOSE, CAPILLARY
Glucose-Capillary: 112 mg/dL — ABNORMAL HIGH (ref 70–99)
Glucose-Capillary: 120 mg/dL — ABNORMAL HIGH (ref 70–99)

## 2023-10-19 LAB — APTT
aPTT: 43 s — ABNORMAL HIGH (ref 24–36)
aPTT: 63 s — ABNORMAL HIGH (ref 24–36)

## 2023-10-19 LAB — MRSA NEXT GEN BY PCR, NASAL: MRSA by PCR Next Gen: NOT DETECTED

## 2023-10-19 MED ORDER — ASPIRIN 81 MG PO TBEC
81.0000 mg | DELAYED_RELEASE_TABLET | Freq: Every day | ORAL | Status: DC
  Administered 2023-10-20 – 2023-10-21 (×2): 81 mg via ORAL
  Filled 2023-10-19 (×2): qty 1

## 2023-10-19 MED ORDER — DONEPEZIL HCL 5 MG PO TABS
5.0000 mg | ORAL_TABLET | Freq: Every day | ORAL | Status: DC
  Administered 2023-10-19 – 2023-10-21 (×3): 5 mg via ORAL
  Filled 2023-10-19 (×3): qty 1

## 2023-10-19 MED ORDER — ASPIRIN 81 MG PO CHEW
324.0000 mg | CHEWABLE_TABLET | Freq: Once | ORAL | Status: AC
  Administered 2023-10-19: 324 mg via ORAL
  Filled 2023-10-19: qty 4

## 2023-10-19 MED ORDER — LANTHANUM CARBONATE 500 MG PO CHEW
1000.0000 mg | CHEWABLE_TABLET | Freq: Three times a day (TID) | ORAL | Status: DC
  Administered 2023-10-19 – 2023-10-21 (×6): 1000 mg via ORAL
  Filled 2023-10-19 (×10): qty 2

## 2023-10-19 MED ORDER — ATORVASTATIN CALCIUM 10 MG PO TABS
20.0000 mg | ORAL_TABLET | Freq: Every day | ORAL | Status: DC
  Administered 2023-10-19 – 2023-10-21 (×3): 20 mg via ORAL
  Filled 2023-10-19 (×3): qty 2

## 2023-10-19 NOTE — Consult Note (Addendum)
 ESRD Consult Note  Requesting provider: Dahal, MD Service requesting consult: TRH Reason for consult: ESRD, provision of dialysis Indication for acute dialysis?: End Stage Renal Disease with missed HD sessions  Outpatient dialysis unit: [facility] Outpatient dialysis prescription: MWF. F180, 2K, 2Ca. Duration: 3h, 45 min. EDW 63.4 kg. Access: LUE AVG. Mircera 150 mcg last given 3/10 and receives calcitriol 1.75 mcg with each session.  Assessment/Recommendations: [name] [name] is a/an 88 y.o. female with a past medical history notable for ESRD on HD MWF admitted with weakness/failure to thrive.   # ESRD: Presented with weakness c/b incomplete or missed HD treatments over the last 1 week. She completed 3.5h of HD overnight and had 2.0L removed. Will plan to resume OP schedule while hospitalized, next session 03/14.  # Volume/ hypertension: Clinically euvolemic. EDW 63.4kg, currently roughly 1.5 kg above EDR. Attempt to achieve EDW as tolerated. Strict I's & O's, daily weights.  # Anemia of Chronic Kidney Disease: Hemoglobin stable at 10.6. Currently receiving mircera, last dose 03/10.   # Secondary Hyperparathyroidism/Hyperphosphatemia: Calcium low this morning,  8.8. Continue calcitriol 1.75 mcg with each HD session.  # Vascular access: LUE AVG.  # Weakness: Per primary. PT consulted, anticipate some form of therapy will be recommended at time of discharge.  # Abnormal EKG: No chest pain, on IV heparin infusion. Per primary, cardiology.  # Type 2 diabetes mellitus: Per primary.  # CHF: No signs of acute exacerbation at this time. Volume management per HD as above.  # Hypertension: Continue home metoprolol 12.5 mg daily. Per primary.  # Paroxysmal atrial fibrillation: Continue home metoprolol 12.5 mg daily. Per primary.  # Disposition: I worry that her weakness may be a barrier to ongoing HD given the limitations it creates for her going to her sessions as scheduled.  This does seem like an acute increase in weakness, however I think having these conversations now would be appropriate. Her daughter does say that she does not enjoy having to have HD. We discussed the chance that her weakness may not improve back to her prior baseline and that she may need additional assistance or be recommended for scute rehab as well; [name] [name] voices understanding to this. Palliative care consult placed, family aware they may be reaching out.  # Additional recommendations: - Dose all meds for creatinine clearance < 10 ml/min  - Unless absolutely necessary, no MRIs with gadolinium.  - Implement save arm precautions.  Prefer needle sticks in the dorsum of the hands or wrists.  No blood pressure measurements in arm. - If blood transfusion is requested during hemodialysis sessions, please alert [place] prior to the session.  - Use synthetic opioids (Fentanyl/Dilaudid) if needed  Recommendations were discussed with the primary team.  History of Present Illness: [name] [name] is a/an 88 y.o. female with a past medical history notable for ESRD on HD MWF, HTN, TIA, atrial fibrillation on eliquis, CHF, dementia admitted with weakness, failure to thrive.   Ms. Overley missed dialysis last week.She was brought to the hospital where she received in-hospital hemodialysis on 03/08 after presenting with weakness causing her to miss her hemodialysis sessions. After discharge, her weakness continued to progress which hindered her from full hemodialysis treatment--she had a short session on 03/10 and missed her session 03/12.   I spoke with her daughter, [name] [name], on the phone. [name] [name] states that she and her husband take care of [name] at home and that prior to the last week when she had onset  of diarrhea and progressive weakness, she has been at her baseline. She is normally able to get around the home on her own. She does have dementia and does not have clear thoughts a good portion of the time,  but has been clear regarding her dislike of hemodialysis.   [name] reports that she feels fine. She states her appetite is good and unchanged but that she doesn't eat very much. She is eating a few bites of eggs and oatmeal on my exam. She has no acute complaints or concerns.  Further complicating her presentation is possible myocardial infarction between discharge on 03/09 and presentation today. Cardiology has been consulted for further assistance with this and she is on IV heparin infusion. She is not having chest pain or dyspnea at this time.   Medications:  Current Facility-Administered Medications  Medication Dose Route Frequency Provider Last Rate Last Admin   acetaminophen (TYLENOL) tablet 650 mg  650 mg Oral Q6H PRN Dahal, [name], MD       Or   acetaminophen (TYLENOL) suppository 650 mg  650 mg Rectal Q6H PRN Dahal, Binaya, MD       albuterol (PROVENTIL) (2.5 MG/3ML) 0.083% nebulizer solution 2.5 mg  2.5 mg Nebulization Q6H PRN Dahal, Binaya, MD       bisacodyl (DULCOLAX) EC tablet 5 mg  5 mg Oral Daily PRN Dahal, [name], MD       Chlorhexidine Gluconate Cloth 2 % PADS 6 each  6 each Topical Q0600 [name], MD   6 each at 10/19/23 [phone]   heparin ADULT infusion 100 units/mL (25000 units/255mL)  1,000 Units/hr Intravenous Continuous Juliette Mangle, RPH 10 mL/hr at 10/19/23 0300 1,000 Units/hr at 10/19/23 0300   hydrALAZINE (APRESOLINE) injection 10 mg  10 mg Intravenous Q6H PRN Dahal, [name], MD       metoprolol tartrate (LOPRESSOR) tablet 12.5 mg  12.5 mg Oral BID Dahal, [name], MD   12.5 mg at 10/19/23 9604   polyethylene glycol (MIRALAX / GLYCOLAX) packet 17 g  17 g Oral Daily PRN Dahal, [name], MD        ALLERGIES Penicillins  MEDICAL HISTORY Past Medical History:  Diagnosis Date   Arthritis    RA   CHF (congestive heart failure) (HCC)    CKD (chronic kidney disease) stage 3, GFR 30-59 ml/min (HCC)    M-W-F dialysis   Dementia (HCC)    Diabetes mellitus without  complication (HCC)    insulin dependent   Hyperlipidemia    Hypertension    Seasonal allergies    Shortness of breath dyspnea    occasional with exertion - no oxygen   Sleep apnea    does not use cpap   TIA (transient ischemic attack) 2015    SOCIAL HISTORY Social History   Socioeconomic History   Marital status: Widowed    Spouse name: Not on file   Number of children: 6   Years of education: 12th   Highest education level: Not on file  Occupational History   Occupation: retired    Associate Professor: RETIRED  Tobacco Use   Smoking status: Never   Smokeless tobacco: Never  Vaping Use   Vaping status: Never Used  Substance and Sexual Activity   Alcohol use: No    Alcohol/week: 0.0 standard drinks of alcohol   Drug use: No   Sexual activity: Not Currently    Birth control/protection: Surgical    Comment: Hysterectomy  Other Topics Concern   Not on file  Social  History Narrative   Patient lives with her husband    Patient is right handed   Patient drinks tea   Social Drivers of Corporate investment banker Strain: Low Risk  (06/08/2023)   Overall Financial Resource Strain (CARDIA)    Difficulty of Paying Living Expenses: Not hard at all  Food Insecurity: No Food Insecurity (10/18/2023)   Hunger Vital Sign    Worried About Running Out of Food in the Last Year: Never true    Ran Out of Food in the Last Year: Never true  Transportation Needs: No Transportation Needs (10/18/2023)   PRAPARE - Administrator, Civil Service (Medical): No    Lack of Transportation (Non-Medical): No  Physical Activity: Inactive (06/08/2023)   Exercise Vital Sign    Days of Exercise per Week: 0 days    Minutes of Exercise per Session: 0 min  Stress: No Stress Concern Present (06/08/2023)   [employer] of Occupational Health - Occupational Stress Questionnaire    Feeling of Stress : Not at all  Social Connections: Socially Isolated (10/19/2023)   Social Connection and Isolation  Panel [NHANES]    Frequency of Communication with Friends and Family: Never    Frequency of Social Gatherings with Friends and Family: More than three times a week    Attends Religious Services: Never    Database administrator or Organizations: No    Attends Banker Meetings: Never    Marital Status: Widowed  Intimate Partner Violence: Not At Risk (10/18/2023)   Humiliation, Afraid, Rape, and Kick questionnaire    Fear of Current or Ex-Partner: No    Emotionally Abused: No    Physically Abused: No    Sexually Abused: No    FAMILY HISTORY Family History  Problem Relation Age of Onset   Diabetes type II Mother    Hypertension Mother    Diabetes type II Father    Hypertension Other    Hyperlipidemia Other    Stroke Other     Review of Systems: 12 systems were reviewed and negative except per HPI  Physical Exam: Vitals:   10/19/23 0640 10/19/23 0753  BP: (!) 118/52 (!) 123/49  Pulse: 63 60  Resp: (!) 23 20  Temp: (!) 97 F (36.1 C) 98.1 F (36.7 C)  SpO2: 98% 100%   No intake/output data recorded.  Intake/Output Summary (Last 24 hours) at 10/19/2023 0851 Last data filed at 10/19/2023 1610 Gross per 24 hour  Intake 94.56 ml  Output 2000 ml  Net -1905.44 ml   General: Appears stated age, in NAD. Feeding herself breakfast and actively engaged with her granddaughter at bedside. Cardio:Regular rate and rhythm.  Pulm:Clear to auscultation bilaterally. Normal work of breathing on room air. Has intermittent productive cough. Abdomen:Soft, nontender, nondistended. RUE:AVWUJ pitting edema to dependent aspects of bilateral LE. Skin:Warm and dry. Neuro:No focal deficit noted.  Psych:Pleasant mood and affect.  Test Results Reviewed Lab Results  Component Value Date   NA 134 (L) 10/19/2023   K 4.3 10/19/2023   CL 95 (L) 10/19/2023   CO2 22 10/19/2023   BUN 56 (H) 10/19/2023   CREATININE 6.54 (H) 10/19/2023   CALCIUM 8.8 (L) 10/19/2023   ALBUMIN 3.6  10/14/2023   PHOS 4.3 07/18/2023    I have reviewed relevant outside healthcare records.   Champ Mungo, DO Internal Medicine PGY-3

## 2023-10-19 NOTE — Progress Notes (Signed)
 PHARMACY - ANTICOAGULATION CONSULT NOTE  Pharmacy Consult for heparin Indication: atrial fibrillation  Labs: Recent Labs    10/18/23 1242 10/18/23 1442 10/18/23 1450 10/19/23 0005  HGB 11.0*  --   --   --   HCT 34.8*  --   --   --   PLT 216  --   --   --   APTT  --   --  31 43*  HEPARINUNFRC  --   --   --  0.97*  CREATININE 5.76*  --   --   --   TROPONINIHS 863* 882*  --   --    Assessment: 88yo female subtherapeutic on heparin with initial dosing while DOAC on hold; no infusion issues or signs of bleeding per RN.  Goal of Therapy:  aPTT 66-102 seconds   Plan:  Increase heparin infusion by 3-4 units/kg/hr to 1000 units/hr. Check PTT in 8 hours.   Vernard Gambles, PharmD, BCPS 10/19/2023 12:40 AM

## 2023-10-19 NOTE — Progress Notes (Signed)
 [facility] PROGRESS NOTE   [name] [name] ZOX:[id number] DOB: 26-Nov-1935 DOA: 10/18/2023  PCP: Dorothyann Peng, MD  Brief History: 88 y.o. female with PMH significant for ESRD HD MWF, DM2, HTN, HLD, OSA, CHF, paroxysmal A-fib, TIA, arthritis who was brought in from home by EMS for generalized weakness.  Patient was found to have abnormal EKG and elevated troponin levels.  Concern was for a cardiac etiology for her symptoms.  She was hospitalized for further management.  Consultants: Nephrology.  Cardiology  Procedures: Hemodialysis    Subjective/Interval History: Patient seen after she completed dialysis session early this morning.  She denies any shortness of breath or chest pain.  Seems to be somewhat confused.  Does not know why she is in the hospital.    Assessment/Plan:  Abnormal EKG with elevated troponin levels/possible ACS EKG showed inferior Q waves and concern for ST elevation.  Case was discussed with cardiology.  They suspect that she had a recent cardiac event.  Patient was noted to have elevated troponin levels.  Patient was started on IV heparin.  However oral anticoagulants were held. Not noted to be on aspirin.  Will defer to cardiology. Echocardiogram was done yesterday which showed EF of 55 to 60% without any wall motion abnormalities.  Grade 1 diastolic dysfunction noted.  No significant valvular disease.  Small pericardial effusion was noted. Further management per cardiology.  Generalized weakness Possibly due to ACS.  PT and OT evaluation.  End-stage renal disease on hemodialysis She is dialyzed on Monday Wednesday Friday schedule.  Dialyzed early this morning.  Apparently has been missing her dialysis sessions due to generalized weakness. Nephrology following.  Recent diarrhea Denies any diarrhea currently.  Monitor for now.  Diabetes mellitus type 2 HbA1c was 5.9 in October 2024.  Monitor CBGs.  SSI.  Paroxysmal atrial  fibrillation Continue metoprolol.  Eliquis is on hold.  Currently on IV heparin.  Anemia of chronic kidney disease. Hemoglobin 10.6 this morning.  Continue to monitor.  History of TIA Stable.  Hyperlipidemia On statin.  Essential hypertension Monitor blood pressures.  Amlodipine on hold.  On metoprolol currently.   DVT Prophylaxis: On IV heparin.  Chronically anticoagulated prior to admission Code Status: Full code Family Communication: No family at bedside Disposition Plan: Hopefully return home when improved  Status is: Inpatient Remains inpatient appropriate because: ACS.      Medications: Scheduled:  Chlorhexidine Gluconate Cloth  6 each Topical Q0600   metoprolol tartrate  12.5 mg Oral BID   Continuous:  heparin 1,000 Units/hr (10/19/23 0300)   WJX:BJYNWGNFAOZHY **OR** acetaminophen, albuterol, bisacodyl, hydrALAZINE, polyethylene glycol  Antibiotics: Anti-infectives (From admission, onward)    None       Objective:  Vital Signs  Vitals:   10/19/23 0606 10/19/23 0618 10/19/23 0640 10/19/23 0753  BP: (!) 161/57 (!) 127/50 (!) 118/52 (!) 123/49  Pulse: (!) 59 69 63 60  Resp: 20 19 (!) 23 20  Temp:  98 F (36.7 C) (!) 97 F (36.1 C) 98.1 F (36.7 C)  TempSrc:   Oral Oral  SpO2: 98% 98% 98% 100%  Weight:      Height:        Intake/Output Summary (Last 24 hours) at 10/19/2023 0908 Last data filed at 10/19/2023 0618 Gross per 24 hour  Intake 94.56 ml  Output 2000 ml  Net -1905.44 ml   Filed Weights   10/18/23 2004  Weight: 64.9 kg    General appearance: Awake alert.  In no distress.  Mildly distracted. Resp: Clear to auscultation bilaterally.  Normal effort Cardio: S1-S2 is normal regular.  No S3-S4.  No rubs murmurs or bruit GI: Abdomen is soft.  Nontender nondistended.  Bowel sounds are present normal.  No masses organomegaly Extremities: No edema.  Full range of motion of lower extremities. Neurologic:No focal neurological deficits.     Lab Results:  Data Reviewed: I have personally reviewed following labs and reports of the imaging studies  CBC: Recent Labs  Lab 10/14/23 1714 10/14/23 1719 10/18/23 1242 10/19/23 0248  WBC 4.9  --  8.0 7.2  NEUTROABS 4.0  --   --   --   HGB 10.1* 12.2 11.0* 10.6*  HCT 30.9* 36.0 34.8* 32.6*  MCV 78.4*  --  79.1* 77.4*  PLT 198  --  216 209    Basic Metabolic Panel: Recent Labs  Lab 10/14/23 1714 10/14/23 1719 10/18/23 1242 10/19/23 0248  NA 129* 127* 134* 134*  K 4.7 4.7 4.2 4.3  CL 90* 94* 93* 95*  CO2 23  --  22 22  GLUCOSE 170* 164* 214* 183*  BUN 74* 63* 46* 56*  CREATININE 6.96* 7.90* 5.76* 6.54*  CALCIUM 8.4*  --  8.9 8.8*    GFR: Estimated Creatinine Clearance: 5.2 mL/min (A) (by C-G formula based on SCr of 6.54 mg/dL (H)).  Liver Function Tests: Recent Labs  Lab 10/14/23 1714  AST 30  ALT 39  ALKPHOS 76  BILITOT 0.7  PROT 6.8  ALBUMIN 3.6    Recent Labs  Lab 10/14/23 1714  LIPASE 47     CBG: Recent Labs  Lab 10/14/23 1543 10/18/23 2008 10/19/23 0635  GLUCAP 159* 174* 112*    Radiology Studies: ECHOCARDIOGRAM COMPLETE Result Date: 10/18/2023    ECHOCARDIOGRAM REPORT   Patient Name:   [name] [name] Date of Exam: 10/18/2023 Medical Rec #:  [id number]              Height:       59.0 in Accession #:    [id number]             Weight:       130.1 lb Date of Birth:  1935/12/27              BSA:          1.536 m Patient Age:    87 years               BP:           134/56 mmHg Patient Gender: F                      HR:           64 bpm. Exam Location:  Inpatient Procedure: 2D Echo, Cardiac Doppler and Color Doppler (Both Spectral and Color            Flow Doppler were utilized during procedure). Indications:    AFIB I48.91  History:        Patient has prior history of Echocardiogram examinations, most                 recent 07/30/2020. CHF, Arrythmias:Atrial Fibrillation,                 Signs/Symptoms:Shortness of Breath; Risk  Factors:Hypertension,                 Diabetes and Dyslipidemia.  Sonographer:    Rosaland Lao Sonographer#2:  Remi Deter  Clark Referring Phys: [id number] [facility]  Sonographer Comments: Suboptimal apical window. IMPRESSIONS  1. Left ventricular ejection fraction, by estimation, is 55 to 60%. The left ventricle has normal function. The left ventricle has no regional wall motion abnormalities. Left ventricular diastolic parameters are consistent with Grade I diastolic dysfunction (impaired relaxation).  2. D-shaped septum suggestive of RV pressure/volume overload. Right ventricular systolic function is moderately reduced. The right ventricular size is moderately enlarged. There is normal pulmonary artery systolic pressure. The estimated right ventricular systolic pressure is 20.4 mmHg.  3. Left atrial size was mild to moderately dilated.  4. Right atrial size was mildly dilated.  5. A small pericardial effusion is present.  6. The mitral valve is normal in structure. No evidence of mitral valve regurgitation. No evidence of mitral stenosis. Moderate mitral annular calcification.  7. Tricuspid valve coaptation looks incomplete. The tricuspid valve is abnormal. Tricuspid valve regurgitation is moderate to severe.  8. The aortic valve is tricuspid. There is mild calcification of the aortic valve. Aortic valve regurgitation is not visualized. No aortic stenosis is present.  9. The inferior vena cava is dilated in size with >50% respiratory variability, suggesting right atrial pressure of 8 mmHg. FINDINGS  Left Ventricle: Left ventricular ejection fraction, by estimation, is 55 to 60%. The left ventricle has normal function. The left ventricle has no regional wall motion abnormalities. The left ventricular internal cavity size was normal in size. There is  no left ventricular hypertrophy. Left ventricular diastolic parameters are consistent with Grade I diastolic dysfunction (impaired relaxation). Right Ventricle: D-shaped  septum suggestive of RV pressure/volume overload. The right ventricular size is moderately enlarged. No increase in right ventricular wall thickness. Right ventricular systolic function is moderately reduced. There is normal pulmonary artery systolic pressure. The tricuspid regurgitant velocity is 1.76 m/s, and with an assumed right atrial pressure of 8 mmHg, the estimated right ventricular systolic pressure is 20.4 mmHg. Left Atrium: Left atrial size was mild to moderately dilated. Right Atrium: Right atrial size was mildly dilated. Pericardium: A small pericardial effusion is present. Mitral Valve: The mitral valve is normal in structure. There is mild thickening of the mitral valve leaflet(s). Moderate mitral annular calcification. No evidence of mitral valve regurgitation. No evidence of mitral valve stenosis. Tricuspid Valve: Tricuspid valve coaptation looks incomplete. The tricuspid valve is abnormal. Tricuspid valve regurgitation is moderate to severe. Aortic Valve: The aortic valve is tricuspid. There is mild calcification of the aortic valve. Aortic valve regurgitation is not visualized. No aortic stenosis is present. Aortic valve mean gradient measures 4.0 mmHg. Aortic valve peak gradient measures 7.3 mmHg. Aortic valve area, by VTI measures 2.25 cm. Pulmonic Valve: The pulmonic valve was normal in structure. Pulmonic valve regurgitation is not visualized. Aorta: The aortic root is normal in size and structure. Venous: The inferior vena cava is dilated in size with greater than 50% respiratory variability, suggesting right atrial pressure of 8 mmHg. IAS/Shunts: No atrial level shunt detected by color flow Doppler.  LEFT VENTRICLE PLAX 2D LVIDd:         4.10 cm   Diastology LVIDs:         2.90 cm   LV e' medial:    5.44 cm/s LV PW:         1.20 cm   LV E/e' medial:  11.6 LV IVS:        1.10 cm   LV e' lateral:   8.27 cm/s LVOT diam:     1.70  cm   LV E/e' lateral: 7.6 LV SV:         47 LV SV Index:   30  LVOT Area:     2.27 cm  RIGHT VENTRICLE RV S prime:     7.83 cm/s TAPSE (M-mode): 1.2 cm LEFT ATRIUM             Index        RIGHT ATRIUM           Index LA diam:        2.90 cm 1.89 cm/m   RA Area:     21.20 cm LA Vol (A2C):   41.0 ml 26.69 ml/m  RA Volume:   74.00 ml  48.17 ml/m LA Vol (A4C):   72.3 ml 47.07 ml/m LA Biplane Vol: 58.2 ml 37.89 ml/m  AORTIC VALVE AV Area (Vmax):    1.92 cm AV Area (Vmean):   2.00 cm AV Area (VTI):     2.25 cm AV Vmax:           135.00 cm/s AV Vmean:          92.700 cm/s AV VTI:            0.208 m AV Peak Grad:      7.3 mmHg AV Mean Grad:      4.0 mmHg LVOT Vmax:         114.00 cm/s LVOT Vmean:        81.700 cm/s LVOT VTI:          0.206 m LVOT/AV VTI ratio: 0.99  AORTA Ao Root diam: 2.80 cm Ao Asc diam:  3.40 cm MITRAL VALVE               TRICUSPID VALVE MV Area (PHT): 2.42 cm    TR Peak grad:   12.4 mmHg MV Decel Time: 313 msec    TR Vmax:        176.00 cm/s MV E velocity: 63.20 cm/s MV A velocity: 73.10 cm/s  SHUNTS MV E/A ratio:  0.86        Systemic VTI:  0.21 m                            Systemic Diam: 1.70 cm Dalton McleanMD Electronically signed by Wilfred Lacy Signature Date/Time: 10/18/2023/3:30:45 PM    Final    DG Chest Port 1 View Result Date: 10/18/2023 CLINICAL DATA:  Shortness of breath. EXAM: PORTABLE CHEST 1 VIEW COMPARISON:  10/14/2023. FINDINGS: Low lung volume. Bilateral lung fields are clear. Bilateral costophrenic angles are clear. Stable cardio-mediastinal silhouette. No acute osseous abnormalities. The soft tissues are within normal limits. IMPRESSION: No active disease. Electronically Signed   By: Jules Schick M.D.   On: 10/18/2023 15:29       LOS: 1 day   Osvaldo Shipper  [facility] Pager on [email or web address]  10/19/2023, 9:08 AM

## 2023-10-19 NOTE — Progress Notes (Addendum)
 Patient Name: [name] [name] Date of Encounter: 10/19/2023 [facility] Cardiologist: Elder Negus, MD   Interval Summary  .    Patient with her daughter in the room, sister (POA) on the phone She denies any active anginal symptoms, no chest pain or shortness of breath She does hemodialysis M/W/F and did well yesterday  Nephrology consulted palliative care to come and talk with the patient and her family   Vital Signs .    Vitals:   10/19/23 0606 10/19/23 0618 10/19/23 0640 10/19/23 0753  BP: (!) 161/57 (!) 127/50 (!) 118/52 (!) 123/49  Pulse: (!) 59 69 63 60  Resp: 20 19 (!) 23 20  Temp:  98 F (36.7 C) (!) 97 F (36.1 C) 98.1 F (36.7 C)  TempSrc:   Oral Oral  SpO2: 98% 98% 98% 100%  Weight:      Height:        Intake/Output Summary (Last 24 hours) at 10/19/2023 1045 Last data filed at 10/19/2023 0618 Gross per 24 hour  Intake 94.56 ml  Output 2000 ml  Net -1905.44 ml      10/18/2023    8:04 PM 10/15/2023    3:10 AM 10/14/2023   10:15 PM  Last 3 Weights  Weight (lbs) 143 lb 1.3 oz 130 lb 1.1 oz 136 lb 11 oz  Weight (kg) 64.9 kg 59 kg 62 kg      Telemetry/ECG    Sinus bradycardia, HR 50-60s - Personally Reviewed Awaiting updated EKG this morning   Physical Exam .   GEN: No acute distress, on room air.   Neck: No JVD Cardiac: RRR, no murmurs, rubs, or gallops.  Respiratory: Clear to auscultation bilaterally. GI: Soft, nontender, non-distended  MS: No edema  Assessment & Plan .    88 y.o. female with PMH significant for ESRD HD MWF, DM2, HTN, HLD, OSA, CHF, paroxysmal A-fib, TIA, arthritis who was brought in from home by EMS for generalized weakness.   Abnormal EKG findings Elevated troponin  Chronic HFpEF EKG showed possible junctional rhythm versus sinus bradycardia with very low P wave amplitude, inferior Q waves, inferolateral subtle ST elevation, minimal lateral ST depression -- Q waves were new when compared to  10/14/2023 Troponin 863 > 882 Echo this admission showed: EF 55-60%, no LV RWMA, G1DD, D-shaped septum, moderately enlarged RV, moderately reduced RV function, biatrial enlargement, moderate to severe TR, dilated IVC Possible that patient may have experienced infarct between 3/8-3/12  She denies any current chest pain or shortness of breath  Patient was switched from home DOAC to IV heparin yesterday  Ordered an updated EKG to be done today  Ordered additional troponin levels to trend to peak, only 2 values from yesterday  Preliminarily discussed direction of care with patient and sister; patient is less engaged in conversations but sister amenable to proceeding with whatever is recommended. Nephrology has raised concern about GOC in setting of missing HD due to weakness and failure to thrive. Appreciate palliative care input Given ASA 324mg  x1 now then 81mg  daily while off DOAC  Paroxysmal atrial fibrillation CHA2DS2-VASc Score = 8  Appears to be in sinus brady on telemetry HR 60s on low dose metoprolol 12.5mg  BID -- ordered updated EKG to confirm rhythm this AM, also some question of junctional rhythm though Hrs normal Rate controlled with HR in 60s Was previously on Eliquis 2.5 mg BID -- was switched to IV heparin yesterday with concern for ACS   Hyperlipidemia Lipid panel  from 09/2022 showed: LDL 123, trigs 180, total 409, HDL 75 Continue Lipitor 20 mg daily, utility of dose escalation in ESRD patients not as convincing  Hypertension Most recent BP 123/49 Continue Lopressor 12.5 mg BID  Held home amlodipine   Per primary ESRD on HD (M/W/F) Diarrhea Diabetes Generalized weakness Anemia of CKD History of TIA Goals of care, set to discuss with palliative   For questions or updates, please contact [facility] Please consult [email or web address] for contact info under    Signed, Olena Leatherwood, PA-C   Patient seen and examined, note reviewed with the signed Advanced  Practice Provider. I personally reviewed laboratory data, imaging studies and relevant notes. I independently examined the patient and formulated the important aspects of the plan. I have personally discussed the plan with the patient and/or family. Comments or changes to the note/plan are indicated below.  Patient see and examined at her bedside. Her granddaughter is at her bedside.  She offers no complaints at this time.  Will continue heparin gtt for the next 24 hr to complete 48 hr. Will start Aspirin , no plans for ischemic evaluation at this time. Agree with palliative care evaluation. Please continue her Lipitor 20 mg daily.  She is sinus bradycardia today - continue lopressor.    Thomasene Ripple DO, MS [facility] Attending Cardiologist [facility]  [address] [place], [place] [address] [phone] Website: [email or web address]

## 2023-10-19 NOTE — Progress Notes (Signed)
 Pt to HD @ approx 0200

## 2023-10-19 NOTE — Progress Notes (Signed)
 Completed 3.5 hours of dialysis. Tolerated net fluid removal of 2L . VS stable. No acute events. All blood returned safely. LUE AVF needles x2 de accessed. Manual pressure applied to sites until hemostasis was achieved. Sites secured with gauze/taped. Patient is alert. No signs of distress upon leaving dialysis unit. Handoff given to Lucretia Roers

## 2023-10-19 NOTE — Progress Notes (Signed)
   Reviewed follow up troponin levels and EKG with Dr. Servando Salina. Troponin is now trending down, peaking yesterday at 882, now down to 780s. EKG showed similar ischemic changes as days prior. Nothing that would change our current treatment plan and will continue to treat patient medically per MD.  Olena Leatherwood, PA-C 10/19/2023 3:11 PM

## 2023-10-19 NOTE — Progress Notes (Addendum)
 Physical Therapy Evaluation Patient Details Name: [name] [name] MRN: [id number] DOB: October 15, 1935 Today's Date: 10/19/2023  History of Present Illness  88 y.o. female presents to [facility] 10/18/23 with generalized weakness after d/c from ED 3/8 for weakness, diarrhea and after missing 1-2 dialysis sessions. Possible infarct from 3/8 to 3/12.  PMH: arthritis, CHF, CKD, dementia, DM, HTN, sleep apnea, TIA, ESRD on HD   Clinical Impression  Pt in bed upon arrival and agreeable to PT eval. PTA, pt would occasionally need assistance to stand and ambulate with a SP cane. In today's session, pt was able to move from supine to sit with CGA for safety. Pt was able to stand and perform step-pivot with CGA and RW. Pt declined further mobility and is likely close to functional mobility baseline. Of note, pt would also become frustrated throughout the session due to being asked multiple questions and becoming confused. Pt has 24/7 physical assist available from family upon d/c home. Family in room and agreeable to wanting pt to return to current home set-up. Pt currently with functional limitations due to the deficits listed below (see PT Problem List). Pt would benefit from acute skilled PT to address functional impairments. Recommending post-acute HHPT to work towards independence with mobility. Acute PT to follow.         If plan is discharge home, recommend the following: A little help with walking and/or transfers;A little help with bathing/dressing/bathroom;Assistance with cooking/housework;Direct supervision/assist for medications management;Direct supervision/assist for financial management;Assist for transportation;Help with stairs or ramp for entrance   Can travel by private vehicle    Yes    Equipment Recommendations None recommended by PT     Functional Status Assessment Patient has had a recent decline in their functional status and demonstrates the ability to make significant improvements in  function in a reasonable and predictable amount of time.     Precautions / Restrictions Precautions Precautions: Fall Restrictions Weight Bearing Restrictions Per Provider Order: No      Mobility  Bed Mobility Overal bed mobility: Needs Assistance Bed Mobility: Supine to Sit    Supine to sit: Contact guard, HOB elevated    General bed mobility comments: CGA for safety, HOB elevated. Cues to shift hips forwards towards EOB    Transfers Overall transfer level: Needs assistance Equipment used: Rolling walker (2 wheels) Transfers: Sit to/from Stand, Bed to chair/wheelchair/BSC Sit to Stand: Contact guard assist   Step pivot transfers: Contact guard assist     General transfer comment: CGA for safety to stand and perform ste-pivot to recliner. Pt declined further ambulation       Balance Overall balance assessment: Needs assistance, Mild deficits observed, not formally tested Sitting-balance support: No upper extremity supported, Feet supported Sitting balance-Leahy Scale: Fair     Standing balance support: Bilateral upper extremity supported, During functional activity, Reliant on assistive device for balance Standing balance-Leahy Scale: Poor Standing balance comment: reliant on UE support        Pertinent Vitals/Pain Pain Assessment Pain Assessment: No/denies pain    Home Living Family/patient expects to be discharged to:: Private residence Living Arrangements: Children (daughter and her husband) Available Help at Discharge: Family Type of Home: Apartment Home Access: Level entry   Home Layout: One level Home Equipment: Cane - single point Additional Comments: pt unsure of home set-up, step daughter and son not sure as well.    Prior Function Prior Level of Function : Needs assist       Physical Assist : Mobility (  physical);ADLs (physical) Mobility (physical): Transfers;Gait ADLs (physical): Bathing;Dressing Mobility Comments: reports needing assistance  to stand and walk. Used SP Cane       Extremity/Trunk Assessment   Upper Extremity Assessment Upper Extremity Assessment: Defer to OT evaluation    Lower Extremity Assessment Lower Extremity Assessment: Generalized weakness    Cervical / Trunk Assessment Cervical / Trunk Assessment: Normal  Communication   Communication Communication: No apparent difficulties    Cognition Arousal: Alert Behavior During Therapy: Flat affect   PT - Cognitive impairments: History of cognitive impairments, Orientation, Memory, Problem solving, Safety/Judgement   Orientation impairments: Place, Situation, Time    PT - Cognition Comments: able to answer a few questions before getting frustrated. Able to follow single step commands Following commands: Impaired Following commands impaired: Follows one step commands with increased time     Cueing Cueing Techniques: Verbal cues, Tactile cues     General Comments General comments (skin integrity, edema, etc.): Son in room at end of session     PT Assessment Patient needs continued PT services  PT Problem List Decreased strength;Decreased activity tolerance;Decreased balance;Decreased mobility;Decreased knowledge of use of DME;Decreased safety awareness       PT Treatment Interventions DME instruction;Gait training;Functional mobility training;Therapeutic exercise;Therapeutic activities;Balance training;Neuromuscular re-education;Patient/family education    PT Goals (Current goals can be found in the Care Plan section)  Acute Rehab PT Goals Patient Stated Goal: to go home PT Goal Formulation: With patient/family Time For Goal Achievement: 11/02/23 Potential to Achieve Goals: Good    Frequency Min 1X/week        AM-PAC PT "6 Clicks" Mobility  Outcome Measure Help needed turning from your back to your side while in a flat bed without using bedrails?: A Little Help needed moving from lying on your back to sitting on the side of a flat  bed without using bedrails?: A Little Help needed moving to and from a bed to a chair (including a wheelchair)?: A Little Help needed standing up from a chair using your arms (e.g., wheelchair or bedside chair)?: A Little Help needed to walk in hospital room?: A Little Help needed climbing 3-5 steps with a railing? : A Lot 6 Click Score: 17    End of Session Equipment Utilized During Treatment: Gait belt Activity Tolerance: Patient tolerated treatment well Patient left: in chair;with call bell/phone within reach;with chair alarm set;with family/visitor present Nurse Communication: Mobility status PT Visit Diagnosis: Other abnormalities of gait and mobility (R26.89);Muscle weakness (generalized) (M62.81)    Time: 0981-1914 PT Time Calculation (min) (ACUTE ONLY): 28 min   Charges:   PT Evaluation $PT Eval Low Complexity: 1 Low PT Treatments $Therapeutic Activity: 8-22 mins PT General Charges $$ ACUTE PT VISIT: 1 Visit        Hilton Cork, PT, DPT Secure Chat Preferred  Rehab Office [phone]   Arturo Morton Brion Aliment 10/19/2023, 1:42 PM

## 2023-10-19 NOTE — Progress Notes (Signed)
 PHARMACY - ANTICOAGULATION CONSULT NOTE  Pharmacy Consult for heparin  Indication: atrial fibrillation  Allergies  Allergen Reactions   Penicillins Swelling and Rash    Patient Measurements: Height: 4\' 11"  (149.9 cm) Weight: 64.9 kg (143 lb 1.3 oz) IBW/kg (Calculated) : 43.2 Heparin Dosing Weight: 56.4kg   Vital Signs: Temp: 97.5 F (36.4 C) (03/13 1143) Temp Source: Oral (03/13 1143) BP: 114/47 (03/13 1143) Pulse Rate: 56 (03/13 1143)  Labs: Recent Labs    10/18/23 1242 10/18/23 1442 10/18/23 1450 10/19/23 0005 10/19/23 0248 10/19/23 0912 10/19/23 1058  HGB 11.0*  --   --   --  10.6*  --   --   HCT 34.8*  --   --   --  32.6*  --   --   PLT 216  --   --   --  209  --   --   APTT  --   --  31 43*  --  63*  --   HEPARINUNFRC  --   --   --  0.97*  --   --   --   CREATININE 5.76*  --   --   --  6.54*  --   --   TROPONINIHS 863* 882*  --   --   --   --  782*    Estimated Creatinine Clearance: 5.2 mL/min (A) (by C-G formula based on SCr of 6.54 mg/dL (H)).   Medical History: Past Medical History:  Diagnosis Date   Arthritis    RA   CHF (congestive heart failure) (HCC)    CKD (chronic kidney disease) stage 3, GFR 30-59 ml/min (HCC)    M-W-F dialysis   Dementia (HCC)    Diabetes mellitus without complication (HCC)    insulin dependent   Hyperlipidemia    Hypertension    Seasonal allergies    Shortness of breath dyspnea    occasional with exertion - no oxygen   Sleep apnea    does not use cpap   TIA (transient ischemic attack) 2015   Assessment: Patient presenting with CC of weakness following missed dialysis sessions.  PMH includes Afib on Eliquis PTA, last dose 3/11 PM.   -aPTT= 63 on 1000 units/hr -plans noted to continue heparin for a total of 48 hrs (started 3/12 ~ 3pm)  Goal of Therapy:  Heparin level 0.3-0.7 units/ml aPTT 66-102 Monitor platelets by anticoagulation protocol: Yes   Plan:  -Increase heparin to 1100 units/hr -Daily aPTT,  heparin level and CBC -anticipate back to PTA Eliquis on 3/14  Harland German, PharmD Clinical Pharmacist **Pharmacist phone directory can now be found on [email or web address] ([email or web address]).  Listed under [facility].

## 2023-10-20 DIAGNOSIS — I480 Paroxysmal atrial fibrillation: Secondary | ICD-10-CM | POA: Diagnosis not present

## 2023-10-20 DIAGNOSIS — N186 End stage renal disease: Secondary | ICD-10-CM | POA: Diagnosis not present

## 2023-10-20 DIAGNOSIS — Z992 Dependence on renal dialysis: Secondary | ICD-10-CM | POA: Diagnosis not present

## 2023-10-20 DIAGNOSIS — R7989 Other specified abnormal findings of blood chemistry: Secondary | ICD-10-CM | POA: Diagnosis not present

## 2023-10-20 LAB — RENAL FUNCTION PANEL
Albumin: 3.1 g/dL — ABNORMAL LOW (ref 3.5–5.0)
Anion gap: 19 — ABNORMAL HIGH (ref 5–15)
BUN: 35 mg/dL — ABNORMAL HIGH (ref 8–23)
CO2: 21 mmol/L — ABNORMAL LOW (ref 22–32)
Calcium: 8.8 mg/dL — ABNORMAL LOW (ref 8.9–10.3)
Chloride: 92 mmol/L — ABNORMAL LOW (ref 98–111)
Creatinine, Ser: 4.47 mg/dL — ABNORMAL HIGH (ref 0.44–1.00)
GFR, Estimated: 9 mL/min — ABNORMAL LOW (ref >60)
Glucose, Bld: 140 mg/dL — ABNORMAL HIGH (ref 70–99)
Phosphorus: 5.8 mg/dL — ABNORMAL HIGH (ref 2.5–4.6)
Potassium: 4.9 mmol/L (ref 3.5–5.1)
Sodium: 132 mmol/L — ABNORMAL LOW (ref 135–145)

## 2023-10-20 LAB — CBC
HCT: 35.3 % — ABNORMAL LOW (ref 36.0–46.0)
Hemoglobin: 11.5 g/dL — ABNORMAL LOW (ref 12.0–15.0)
MCH: 25.4 pg — ABNORMAL LOW (ref 26.0–34.0)
MCHC: 32.6 g/dL (ref 30.0–36.0)
MCV: 77.9 fL — ABNORMAL LOW (ref 80.0–100.0)
Platelets: 206 10*3/uL (ref 150–400)
RBC: 4.53 MIL/uL (ref 3.87–5.11)
RDW: 20 % — ABNORMAL HIGH (ref 11.5–15.5)
WBC: 7.5 10*3/uL (ref 4.0–10.5)
nRBC: 7.3 % — ABNORMAL HIGH (ref 0.0–0.2)

## 2023-10-20 LAB — HEPATITIS B SURFACE ANTIBODY, QUANTITATIVE: Hep B S AB Quant (Post): 1273 m[IU]/mL

## 2023-10-20 LAB — APTT: aPTT: 120 s — ABNORMAL HIGH (ref 24–36)

## 2023-10-20 LAB — HEPARIN LEVEL (UNFRACTIONATED): Heparin Unfractionated: 0.95 [IU]/mL — ABNORMAL HIGH (ref 0.30–0.70)

## 2023-10-20 MED ORDER — APIXABAN 2.5 MG PO TABS
2.5000 mg | ORAL_TABLET | Freq: Two times a day (BID) | ORAL | Status: DC
  Administered 2023-10-20 – 2023-10-21 (×4): 2.5 mg via ORAL
  Filled 2023-10-20 (×4): qty 1

## 2023-10-20 NOTE — Progress Notes (Signed)
 PHARMACY - ANTICOAGULATION CONSULT NOTE  Pharmacy Consult for heparin Indication: atrial fibrillation  Labs: Recent Labs    10/18/23 1242 10/18/23 1442 10/19/23 0005 10/19/23 0248 10/19/23 0912 10/19/23 1058 10/19/23 1302 10/19/23 1415 10/20/23 0405 10/20/23 0406  HGB 11.0*  --   --  10.6*  --   --   --   --  11.5*  --   HCT 34.8*  --   --  32.6*  --   --   --   --  35.3*  --   PLT 216  --   --  209  --   --   --   --  206  --   APTT  --    < > 43*  --  63*  --   --   --  120*  --   HEPARINUNFRC  --   --  0.97*  --   --   --   --   --  0.95*  --   CREATININE 5.76*  --   --  6.54*  --   --   --   --   --  4.47*  TROPONINIHS 863*   < >  --   --   --  782* 788* 824*  --   --    < > = values in this interval not displayed.   Assessment: 88yo female now supratherapeutic on heparin after rate change; no infusion issues or signs of bleeding per RN.  Goal of Therapy:  aPTT 66-102 seconds   Plan:  Decrease heparin infusion by 1-2 units/kg/hr to 1000 units/hr. Check PTT in 8 hours.   Vernard Gambles, PharmD, BCPS 10/20/2023 4:57 AM

## 2023-10-20 NOTE — Progress Notes (Signed)
 [facility] PROGRESS NOTE   [name] [name] LKG:[id number] DOB: 1935-08-14 DOA: 10/18/2023  PCP: Dorothyann Peng, MD  Brief History: 88 y.o. female with PMH significant for ESRD HD MWF, DM2, HTN, HLD, OSA, CHF, paroxysmal A-fib, TIA, arthritis who was brought in from home by EMS for generalized weakness.  Patient was found to have abnormal EKG and elevated troponin levels.  Concern was for a cardiac etiology for her symptoms.  She was hospitalized for further management.  Consultants: Nephrology.  Cardiology  Procedures: Hemodialysis    Subjective/Interval History: Patient lying comfortably on the bed.  Does not appear to be in any discomfort or distress.  She denies any shortness of breath or chest pain this morning.     Assessment/Plan:  Abnormal EKG with elevated troponin levels/possible ACS EKG showed inferior Q waves and concern for ST elevation.  Case was discussed with cardiology.  They suspect that she had a recent cardiac event.  Patient was noted to have elevated troponin levels.  Patient was started on IV heparin.  Oral anticoagulants were held. Echocardiogram was done which showed EF of 55 to 60% without any wall motion abnormalities.  Grade 1 diastolic dysfunction noted.  No significant valvular disease.  Small pericardial effusion was noted. Medical management recommended by cardiology.  She remains on IV heparin.  Noted to be on aspirin statin and beta-blocker as well.  Generalized weakness Possibly due to ACS.  Seen by physical therapy.  Home health is recommended.  End-stage renal disease on hemodialysis She is dialyzed on Monday Wednesday Friday schedule.  Nephrology is following.  Plan is for dialysis today. Patient has missed dialysis sessions due to weakness.  Palliative care has been consulted to assist with goals of care as nephrology is concerned that patient may not be able to tolerate outpatient dialysis going forward.    Recent  diarrhea Denies any diarrhea currently.  Monitor for now.  Diabetes mellitus type 2 HbA1c was 5.9 in October 2024.  Monitor CBGs.  SSI.  Paroxysmal atrial fibrillation Continue metoprolol.  Eliquis is on hold.  Currently on IV heparin.  Anemia of chronic kidney disease. Stable hemoglobin.  No evidence of overt bleeding.  History of TIA Stable.  Hyperlipidemia On statin.  Essential hypertension Monitor blood pressures.  Amlodipine on hold.  On metoprolol currently.   DVT Prophylaxis: On IV heparin.  Chronically anticoagulated prior to admission Code Status: Full code Family Communication: No family at bedside Disposition Plan: Hopefully return home when improved  Status is: Inpatient Remains inpatient appropriate because: ACS.      Medications: Scheduled:  aspirin EC  81 mg Oral Daily   atorvastatin  20 mg Oral Daily   Chlorhexidine Gluconate Cloth  6 each Topical Q0600   donepezil  5 mg Oral QHS   lanthanum  1,000 mg Oral TID WC   metoprolol tartrate  12.5 mg Oral BID   Continuous:  heparin 1,000 Units/hr (10/20/23 0501)   GUY:[id number] **OR** acetaminophen, albuterol, bisacodyl, hydrALAZINE, polyethylene glycol   Objective:  Vital Signs  Vitals:   10/20/23 0813 10/20/23 0830 10/20/23 0900 10/20/23 0930  BP: (!) 139/57 (!) 127/58 (!) 119/50 (!) 114/54  Pulse: (!) 55 (!) 56 (!) 57 60  Resp:  (!) 23 18 (!) 22  Temp:      TempSrc:      SpO2: 98% 91% 99% 97%  Weight:      Height:       No intake or output data in the 24  hours ending 10/20/23 0948  Filed Weights   10/18/23 2004 10/20/23 0257 10/20/23 0806  Weight: 64.9 kg 60.2 kg 61.8 kg    General appearance: Awake alert.  In no distress Resp: Clear to auscultation bilaterally.  Normal effort Cardio: S1-S2 is normal regular.  No S3-S4.  No rubs murmurs or bruit GI: Abdomen is soft.  Nontender nondistended.  Bowel sounds are present normal.  No masses organomegaly   Lab Results:  Data  Reviewed: I have personally reviewed following labs and reports of the imaging studies  CBC: Recent Labs  Lab 10/14/23 1714 10/14/23 1719 10/18/23 1242 10/19/23 0248 10/20/23 0405  WBC 4.9  --  8.0 7.2 7.5  NEUTROABS 4.0  --   --   --   --   HGB 10.1* 12.2 11.0* 10.6* 11.5*  HCT 30.9* 36.0 34.8* 32.6* 35.3*  MCV 78.4*  --  79.1* 77.4* 77.9*  PLT 198  --  216 209 206    Basic Metabolic Panel: Recent Labs  Lab 10/14/23 1714 10/14/23 1719 10/18/23 1242 10/19/23 0248 10/20/23 0406  NA 129* 127* 134* 134* 132*  K 4.7 4.7 4.2 4.3 4.9  CL 90* 94* 93* 95* 92*  CO2 23  --  22 22 21*  GLUCOSE 170* 164* 214* 183* 140*  BUN 74* 63* 46* 56* 35*  CREATININE 6.96* 7.90* 5.76* 6.54* 4.47*  CALCIUM 8.4*  --  8.9 8.8* 8.8*  PHOS  --   --   --   --  5.8*    GFR: Estimated Creatinine Clearance: 7.4 mL/min (A) (by C-G formula based on SCr of 4.47 mg/dL (H)).  Liver Function Tests: Recent Labs  Lab 10/14/23 1714 10/20/23 0406  AST 30  --   ALT 39  --   ALKPHOS 76  --   BILITOT 0.7  --   PROT 6.8  --   ALBUMIN 3.6 3.1*    Recent Labs  Lab 10/14/23 1714  LIPASE 47     CBG: Recent Labs  Lab 10/14/23 1543 10/18/23 2008 10/19/23 0635 10/19/23 1922  GLUCAP 159* 174* 112* 120*    Radiology Studies: ECHOCARDIOGRAM COMPLETE Result Date: 10/18/2023    ECHOCARDIOGRAM REPORT   Patient Name:   [name] [name] Date of Exam: 10/18/2023 Medical Rec #:  [id number]              Height:       59.0 in Accession #:    [id number]             Weight:       130.1 lb Date of Birth:  09/12/1935              BSA:          1.536 m Patient Age:    87 years               BP:           134/56 mmHg Patient Gender: F                      HR:           64 bpm. Exam Location:  Inpatient Procedure: 2D Echo, Cardiac Doppler and Color Doppler (Both Spectral and Color            Flow Doppler were utilized during procedure). Indications:    AFIB I48.91  History:        Patient has prior history of  Echocardiogram examinations,  most                 recent 07/30/2020. CHF, Arrythmias:Atrial Fibrillation,                 Signs/Symptoms:Shortness of Breath; Risk Factors:Hypertension,                 Diabetes and Dyslipidemia.  Sonographer:    Rosaland Lao Sonographer#2:  Darlys Gales Referring Phys: [id number] [facility]  Sonographer Comments: Suboptimal apical window. IMPRESSIONS  1. Left ventricular ejection fraction, by estimation, is 55 to 60%. The left ventricle has normal function. The left ventricle has no regional wall motion abnormalities. Left ventricular diastolic parameters are consistent with Grade I diastolic dysfunction (impaired relaxation).  2. D-shaped septum suggestive of RV pressure/volume overload. Right ventricular systolic function is moderately reduced. The right ventricular size is moderately enlarged. There is normal pulmonary artery systolic pressure. The estimated right ventricular systolic pressure is 20.4 mmHg.  3. Left atrial size was mild to moderately dilated.  4. Right atrial size was mildly dilated.  5. A small pericardial effusion is present.  6. The mitral valve is normal in structure. No evidence of mitral valve regurgitation. No evidence of mitral stenosis. Moderate mitral annular calcification.  7. Tricuspid valve coaptation looks incomplete. The tricuspid valve is abnormal. Tricuspid valve regurgitation is moderate to severe.  8. The aortic valve is tricuspid. There is mild calcification of the aortic valve. Aortic valve regurgitation is not visualized. No aortic stenosis is present.  9. The inferior vena cava is dilated in size with >50% respiratory variability, suggesting right atrial pressure of 8 mmHg. FINDINGS  Left Ventricle: Left ventricular ejection fraction, by estimation, is 55 to 60%. The left ventricle has normal function. The left ventricle has no regional wall motion abnormalities. The left ventricular internal cavity size was normal in size. There is  no left  ventricular hypertrophy. Left ventricular diastolic parameters are consistent with Grade I diastolic dysfunction (impaired relaxation). Right Ventricle: D-shaped septum suggestive of RV pressure/volume overload. The right ventricular size is moderately enlarged. No increase in right ventricular wall thickness. Right ventricular systolic function is moderately reduced. There is normal pulmonary artery systolic pressure. The tricuspid regurgitant velocity is 1.76 m/s, and with an assumed right atrial pressure of 8 mmHg, the estimated right ventricular systolic pressure is 20.4 mmHg. Left Atrium: Left atrial size was mild to moderately dilated. Right Atrium: Right atrial size was mildly dilated. Pericardium: A small pericardial effusion is present. Mitral Valve: The mitral valve is normal in structure. There is mild thickening of the mitral valve leaflet(s). Moderate mitral annular calcification. No evidence of mitral valve regurgitation. No evidence of mitral valve stenosis. Tricuspid Valve: Tricuspid valve coaptation looks incomplete. The tricuspid valve is abnormal. Tricuspid valve regurgitation is moderate to severe. Aortic Valve: The aortic valve is tricuspid. There is mild calcification of the aortic valve. Aortic valve regurgitation is not visualized. No aortic stenosis is present. Aortic valve mean gradient measures 4.0 mmHg. Aortic valve peak gradient measures 7.3 mmHg. Aortic valve area, by VTI measures 2.25 cm. Pulmonic Valve: The pulmonic valve was normal in structure. Pulmonic valve regurgitation is not visualized. Aorta: The aortic root is normal in size and structure. Venous: The inferior vena cava is dilated in size with greater than 50% respiratory variability, suggesting right atrial pressure of 8 mmHg. IAS/Shunts: No atrial level shunt detected by color flow Doppler.  LEFT VENTRICLE PLAX 2D LVIDd:  4.10 cm   Diastology LVIDs:         2.90 cm   LV e' medial:    5.44 cm/s LV PW:         1.20 cm    LV E/e' medial:  11.6 LV IVS:        1.10 cm   LV e' lateral:   8.27 cm/s LVOT diam:     1.70 cm   LV E/e' lateral: 7.6 LV SV:         47 LV SV Index:   30 LVOT Area:     2.27 cm  RIGHT VENTRICLE RV S prime:     7.83 cm/s TAPSE (M-mode): 1.2 cm LEFT ATRIUM             Index        RIGHT ATRIUM           Index LA diam:        2.90 cm 1.89 cm/m   RA Area:     21.20 cm LA Vol (A2C):   41.0 ml 26.69 ml/m  RA Volume:   74.00 ml  48.17 ml/m LA Vol (A4C):   72.3 ml 47.07 ml/m LA Biplane Vol: 58.2 ml 37.89 ml/m  AORTIC VALVE AV Area (Vmax):    1.92 cm AV Area (Vmean):   2.00 cm AV Area (VTI):     2.25 cm AV Vmax:           135.00 cm/s AV Vmean:          92.700 cm/s AV VTI:            0.208 m AV Peak Grad:      7.3 mmHg AV Mean Grad:      4.0 mmHg LVOT Vmax:         114.00 cm/s LVOT Vmean:        81.700 cm/s LVOT VTI:          0.206 m LVOT/AV VTI ratio: 0.99  AORTA Ao Root diam: 2.80 cm Ao Asc diam:  3.40 cm MITRAL VALVE               TRICUSPID VALVE MV Area (PHT): 2.42 cm    TR Peak grad:   12.4 mmHg MV Decel Time: 313 msec    TR Vmax:        176.00 cm/s MV E velocity: 63.20 cm/s MV A velocity: 73.10 cm/s  SHUNTS MV E/A ratio:  0.86        Systemic VTI:  0.21 m                            Systemic Diam: 1.70 cm Dalton McleanMD Electronically signed by Wilfred Lacy Signature Date/Time: 10/18/2023/3:30:45 PM    Final    DG Chest Port 1 View Result Date: 10/18/2023 CLINICAL DATA:  Shortness of breath. EXAM: PORTABLE CHEST 1 VIEW COMPARISON:  10/14/2023. FINDINGS: Low lung volume. Bilateral lung fields are clear. Bilateral costophrenic angles are clear. Stable cardio-mediastinal silhouette. No acute osseous abnormalities. The soft tissues are within normal limits. IMPRESSION: No active disease. Electronically Signed   By: Jules Schick M.D.   On: 10/18/2023 15:29       LOS: 2 days   [name] [name]  [facility] Pager on [email or web address]  10/20/2023, 9:48 AM

## 2023-10-20 NOTE — Procedures (Signed)
 I was present at this dialysis session. I have reviewed the session itself and made appropriate changes.   Filed Weights   10/18/23 2004 10/20/23 0257 10/20/23 0806  Weight: 64.9 kg 60.2 kg 61.8 kg    Recent Labs  Lab 10/20/23 0406  NA 132*  K 4.9  CL 92*  CO2 21*  GLUCOSE 140*  BUN 35*  CREATININE 4.47*  CALCIUM 8.8*  PHOS 5.8*    Recent Labs  Lab 10/14/23 1714 10/14/23 1719 10/18/23 1242 10/19/23 0248 10/20/23 0405  WBC 4.9  --  8.0 7.2 7.5  NEUTROABS 4.0  --   --   --   --   HGB 10.1*   < > 11.0* 10.6* 11.5*  HCT 30.9*   < > 34.8* 32.6* 35.3*  MCV 78.4*  --  79.1* 77.4* 77.9*  PLT 198  --  216 209 206   < > = values in this interval not displayed.    Scheduled Meds:  aspirin EC  81 mg Oral Daily   atorvastatin  20 mg Oral Daily   Chlorhexidine Gluconate Cloth  6 each Topical Q0600   donepezil  5 mg Oral QHS   lanthanum  1,000 mg Oral TID WC   metoprolol tartrate  12.5 mg Oral BID   Continuous Infusions:  heparin 1,000 Units/hr (10/20/23 0501)   PRN Meds:.acetaminophen **OR** acetaminophen, albuterol, bisacodyl, hydrALAZINE, polyethylene glycol   Louie Bun,  MD 10/20/2023, 11:55 AM

## 2023-10-20 NOTE — Progress Notes (Addendum)
 Patient Name: [name] [name] Date of Encounter: 10/20/2023 [facility] Cardiologist: Elder Negus, MD   Interval Summary  .    Patient was seen and examined in the dialysis unit while receiving dialysis  She reports feeling good -- about the same as yesterday Denies any chest pain or shortness of breath She was resting comfortably watching TV in dialysis   Vital Signs .    Vitals:   10/20/23 0000 10/20/23 0257 10/20/23 0806 10/20/23 0813  BP:  (!) 136/51 124/60 (!) 139/57  Pulse:  (!) 56 (!) 53 (!) 55  Resp: 20 20 16    Temp:  97.7 F (36.5 C) 98 F (36.7 C)   TempSrc:  Oral    SpO2:  97% 97% 98%  Weight:  60.2 kg 61.8 kg   Height:       No intake or output data in the 24 hours ending 10/20/23 0827    10/20/2023    8:06 AM 10/20/2023    2:57 AM 10/18/2023    8:04 PM  Last 3 Weights  Weight (lbs) 136 lb 3.9 oz 132 lb 11.5 oz 143 lb 1.3 oz  Weight (kg) 61.8 kg 60.2 kg 64.9 kg      Telemetry/ECG    Sinus bradycardia, HR 50s - Personally Reviewed  Physical Exam .   GEN: No acute distress, on room air, in dialysis.   Neck: No JVD Cardiac: bradycardic, regular rhythm, no murmurs, rubs, or gallops.  Respiratory: Clear to auscultation bilaterally. GI: Soft, nontender, non-distended  MS: No edema  Assessment & Plan .   88 y.o. female with PMH significant for ESRD HD MWF, DM2, HTN, HLD, OSA, CHF, paroxysmal A-fib, TIA, arthritis who was brought in from home by EMS for generalized weakness.   Abnormal EKG findings Elevated troponin  Chronic HFpEF EKG showed possible junctional rhythm versus sinus bradycardia with very low P wave amplitude, inferior Q waves, inferolateral subtle ST elevation, minimal lateral ST depression -- Q waves were new when compared to 10/14/2023 Troponin 863 > 882 > 782 > 788 > 824 Updated EKGs showed no acute ischemic changes and were fairly similar to EKGs prior  Echo this admission showed: EF 55-60%, no LV RWMA, G1DD,  D-shaped septum, moderately enlarged RV, moderately reduced RV function, biatrial enlargement, moderate to severe TR, dilated IVC Possible that patient may have experienced infarct between 3/8-3/12  She denies any current chest pain or shortness of breath  Continue IV heparin for 48 hours total Continue 81mg  daily while off DOAC Preliminarily discussed direction of care with patient and sister yesterday; patient is less engaged in conversations but sister amenable to proceeding with whatever is recommended.  Nephrology has raised concern about GOC in setting of missing HD due to weakness and failure to thrive. Appreciate palliative care input  Prolonged QT Most recent QTc 510  We suspect this likely to be secondary to ischemia  Continue to monitor electrolytes -- management of electrolytes per primary Continue to avoid QT-prolonging medications at this time    Paroxysmal atrial fibrillation CHA2DS2-VASc Score = 8  Appears to be in sinus brady on telemetry HR 60s on low dose metoprolol 12.5mg  BID Rate controlled with HR in 60s Was previously on Eliquis 2.5 mg BID -- was switched to IV heparin yesterday with concern for ACS  Continue Lopressor 12.5 mg BID    Hyperlipidemia Lipid panel from 09/2022 showed: LDL 123, trigs 180, total 161, HDL 75 Continue Lipitor 20 mg daily, utility  of dose escalation in ESRD patients not as convincing   Hypertension Most recent BP 139/57 Continue Lopressor 12.5 mg BID  Held home amlodipine    Per primary ESRD on HD (M/W/F) Diarrhea Diabetes Generalized weakness Anemia of CKD History of TIA Goals of care, set to discuss with palliative    For questions or updates, please contact [facility] Please consult [email or web address] for contact info under     Signed, Olena Leatherwood, PA-C   Patient seen and examined, note reviewed with the signed Advanced Practice Provider. I personally reviewed laboratory data, imaging studies and relevant  notes. I independently examined the patient and formulated the important aspects of the plan. I have personally discussed the plan with the patient and/or family. Comments or changes to the note/plan are indicated below.   Patient see and examined at her bedside at dialysis. Her granddaughter is at her bedside.  She offers no complaints at this time.  Will continue heparin gtt  to complete 48 hr. continue aspirin, no plans for ischemic evaluation at this time. Agree with palliative care evaluation. Please continue her Lipitor 20 mg daily.  Continue to hold lopressor     Thomasene Ripple DO, MS [facility] Attending Cardiologist [facility]  [address] [place], [place] [address] [phone] Website: [email or web address]

## 2023-10-20 NOTE — Progress Notes (Signed)
 Nephrology Follow-Up Consult note   Outpatient dialysis unit: [facility] Outpatient dialysis prescription: MWF. F180, 2K, 2Ca. Duration: 3h, 45 min. EDW 63.4 kg. Access: LUE AVG. Mircera 150 mcg last given 3/10 and receives calcitriol 1.75 mcg with each session.   Assessment/Recommendations: [name] [name] is a/an 88 y.o. female with a past medical history notable for ESRD on HD MWF admitted with weakness/failure to thrive.    # ESRD: Cont HD on MWF schedule while here   # Volume/ hypertension: maintain edw as able. Near baseline   # Anemia of Chronic Kidney Disease: Hgb at goal. Last mircera on 3/10   # Secondary Hyperparathyroidism/Hyperphosphatemia: calcitriol with HD sessions. Phos near goal   # Vascular access: LUE AVG.   # Weakness: Per primary. PT consulted, anticipate some form of therapy will be recommended at time of discharge.   # Abnormal EKG: No chest pain, on IV heparin infusion. Per primary, cardiology.   # Type 2 diabetes mellitus: Per primary.   # Paroxysmal atrial fibrillation: Continue home metoprolol 12.5 mg daily. Per primary.   # Disposition: Patient has expressed disinterest with dialysis in the past.  We have had goals of care conversations in the outpatient setting and for now the patient is continuing dialysis with a sitter.  Family is open to further goals of care conversations.  Agree with palliative care involvement   # Additional recommendations: - Dose all meds for creatinine clearance < 10 ml/min  - Unless absolutely necessary, no MRIs with gadolinium.  - Implement save arm precautions.  Prefer needle sticks in the dorsum of the hands or wrists.  No blood pressure measurements in arm. - If blood transfusion is requested during hemodialysis sessions, please alert [place] prior to the session.  - Use synthetic opioids (Fentanyl/Dilaudid) if needed   Recommendations conveyed to primary service.    [name] [facility] 10/20/2023 11:50 AM  ___________________________________________________________  CC: Weakness  Interval History/Subjective: Patient states she feels well today.  Feels like she could get up and run.  Seen on dialysis with no issues   Medications:  Current Facility-Administered Medications  Medication Dose Route Frequency Provider Last Rate Last Admin   acetaminophen (TYLENOL) tablet 650 mg  650 mg Oral Q6H PRN Dahal, [name], MD       Or   acetaminophen (TYLENOL) suppository 650 mg  650 mg Rectal Q6H PRN Dahal, Binaya, MD       albuterol (PROVENTIL) (2.5 MG/3ML) 0.083% nebulizer solution 2.5 mg  2.5 mg Nebulization Q6H PRN Dahal, [name], MD       aspirin EC tablet 81 mg  81 mg Oral Daily Tobb, Kardie, DO       atorvastatin (LIPITOR) tablet 20 mg  20 mg Oral Daily Osvaldo Shipper, MD   20 mg at 10/19/23 1158   bisacodyl (DULCOLAX) EC tablet 5 mg  5 mg Oral Daily PRN Dahal, [name], MD       Chlorhexidine Gluconate Cloth 2 % PADS 6 each  6 each Topical Q0600 [name], MD   6 each at 10/20/23 0610   donepezil (ARICEPT) tablet 5 mg  5 mg Oral QHS Osvaldo Shipper, MD   5 mg at 10/19/23 2133   heparin ADULT infusion 100 units/mL (25000 units/227mL)  1,000 Units/hr Intravenous Continuous Juliette Mangle, RPH 10 mL/hr at 10/20/23 0501 1,000 Units/hr at 10/20/23 0501   hydrALAZINE (APRESOLINE) injection 10 mg  10 mg Intravenous Q6H PRN Lorin Glass, MD  lanthanum (FOSRENOL) chewable tablet 1,000 mg  1,000 mg Oral TID WC Osvaldo Shipper, MD   1,000 mg at 10/19/23 1159   metoprolol tartrate (LOPRESSOR) tablet 12.5 mg  12.5 mg Oral BID Dahal, [name], MD   12.5 mg at 10/19/23 2132   polyethylene glycol (MIRALAX / GLYCOLAX) packet 17 g  17 g Oral Daily PRN Dahal, [name], MD          Review of Systems: 10 systems reviewed and negative except per interval history/subjective  Physical Exam: Vitals:   10/20/23 1130 10/20/23 1149  BP: (!) 114/50 (!) 108/49  Pulse: 62 63   Resp: 18 (!) 24  Temp:    SpO2: 96% 99%   No intake/output data recorded. No intake or output data in the 24 hours ending 10/20/23 1150 Constitutional: well-appearing, no acute distress ENMT: ears and nose without scars or lesions, MMM CV: normal rate, no edema Respiratory: Bilateral chest rise, normal work of breathing Gastrointestinal: soft, non-tender, no palpable masses or hernias Skin: no visible lesions or rashes Psych: alert, appropriate mood and affect   Test Results I personally reviewed new and old clinical labs and radiology tests Lab Results  Component Value Date   NA 132 (L) 10/20/2023   K 4.9 10/20/2023   CL 92 (L) 10/20/2023   CO2 21 (L) 10/20/2023   BUN 35 (H) 10/20/2023   CREATININE 4.47 (H) 10/20/2023   CALCIUM 8.8 (L) 10/20/2023   ALBUMIN 3.1 (L) 10/20/2023   PHOS 5.8 (H) 10/20/2023    CBC Recent Labs  Lab 10/14/23 1714 10/14/23 1719 10/18/23 1242 10/19/23 0248 10/20/23 0405  WBC 4.9  --  8.0 7.2 7.5  NEUTROABS 4.0  --   --   --   --   HGB 10.1*   < > 11.0* 10.6* 11.5*  HCT 30.9*   < > 34.8* 32.6* 35.3*  MCV 78.4*  --  79.1* 77.4* 77.9*  PLT 198  --  216 209 206   < > = values in this interval not displayed.

## 2023-10-20 NOTE — Progress Notes (Signed)
 PHARMACY - ANTICOAGULATION CONSULT NOTE  Pharmacy Consult for heparin  Indication: atrial fibrillation  Allergies  Allergen Reactions   Penicillins Swelling and Rash    Patient Measurements: Height: 4\' 11"  (149.9 cm) Weight: 59.8 kg (131 lb 13.4 oz) IBW/kg (Calculated) : 43.2 Heparin Dosing Weight: 56.4kg   Vital Signs: Temp: 98.1 F (36.7 C) (03/14 1250) Temp Source: Oral (03/14 1250) BP: 105/55 (03/14 1252) Pulse Rate: 66 (03/14 1252)  Labs: Recent Labs    10/18/23 1242 10/18/23 1442 10/19/23 0005 10/19/23 0248 10/19/23 0912 10/19/23 1058 10/19/23 1302 10/19/23 1415 10/20/23 0405 10/20/23 0406  HGB 11.0*  --   --  10.6*  --   --   --   --  11.5*  --   HCT 34.8*  --   --  32.6*  --   --   --   --  35.3*  --   PLT 216  --   --  209  --   --   --   --  206  --   APTT  --    < > 43*  --  63*  --   --   --  120*  --   HEPARINUNFRC  --   --  0.97*  --   --   --   --   --  0.95*  --   CREATININE 5.76*  --   --  6.54*  --   --   --   --   --  4.47*  TROPONINIHS 863*   < >  --   --   --  782* 788* 824*  --   --    < > = values in this interval not displayed.    Estimated Creatinine Clearance: 7.2 mL/min (A) (by C-G formula based on SCr of 4.47 mg/dL (H)).   Medical History: Past Medical History:  Diagnosis Date   Arthritis    RA   CHF (congestive heart failure) (HCC)    CKD (chronic kidney disease) stage 3, GFR 30-59 ml/min (HCC)    M-W-F dialysis   Dementia (HCC)    Diabetes mellitus without complication (HCC)    insulin dependent   Hyperlipidemia    Hypertension    Seasonal allergies    Shortness of breath dyspnea    occasional with exertion - no oxygen   Sleep apnea    does not use cpap   TIA (transient ischemic attack) 2015   Assessment: Patient presenting with CC of weakness following missed dialysis sessions.  PMH includes Afib on Eliquis PTA, last dose 3/11 PM.   -pharmacy asked to change back to apixaban after 48hrs of heparin (started 3/12 ~  3pm)  Goal of Therapy:  Heparin level 0.3-0.7 units/ml aPTT 66-102 Monitor platelets by anticoagulation protocol: Yes   Plan:  -Will start apixaban now at 2.5mg  po bid -Stop heparin  Harland German, PharmD Clinical Pharmacist **Pharmacist phone directory can now be found on [email or web address] ([email or web address]).  Listed under [facility].

## 2023-10-20 NOTE — Procedures (Addendum)
 Received patient in bed to unit.  Alert and oriented.  Informed consent signed and in chart.   TX duration: 3.5 hours  Patient tolerated well.  Transported back to the room  Alert, without acute distress.  Hand-off given to patient's nurse.   Access used: left graft  Access issues: none  Total UF removed: 2 liters Medication(s) given: none   Lu Duffel, RN Kidney Dialysis Unit

## 2023-10-20 NOTE — Progress Notes (Addendum)
   Per d/w Dr. Servando Salina, transition to Eliquis today after completing 48 hours of heparin - ordered per pharmacy to help with timing of transition. Discussed RV findings with MD, does not feel PE w/u needed at this time, presumed possibly coronary ischemia/volume driven instead. Continue plan as outlined.  Olena Leatherwood, PA-C 10/20/2023 1:23 PM

## 2023-10-20 NOTE — Progress Notes (Signed)
 OT Cancellation Note  Patient Details Name: [name] [name] MRN: [id number] DOB: [birth year]   Cancelled Treatment:    Reason Eval/Treat Not Completed: Patient at procedure or test/ unavailable (HD). Will follow.   Evern Bio 10/20/2023, 8:26 AM Berna Spare, OTR/L Acute Rehabilitation Services Office: [phone]

## 2023-10-21 DIAGNOSIS — N186 End stage renal disease: Secondary | ICD-10-CM | POA: Diagnosis not present

## 2023-10-21 DIAGNOSIS — Z992 Dependence on renal dialysis: Secondary | ICD-10-CM | POA: Diagnosis not present

## 2023-10-21 DIAGNOSIS — E1122 Type 2 diabetes mellitus with diabetic chronic kidney disease: Secondary | ICD-10-CM | POA: Diagnosis not present

## 2023-10-21 DIAGNOSIS — R7989 Other specified abnormal findings of blood chemistry: Secondary | ICD-10-CM | POA: Diagnosis not present

## 2023-10-21 DIAGNOSIS — I480 Paroxysmal atrial fibrillation: Secondary | ICD-10-CM | POA: Diagnosis not present

## 2023-10-21 LAB — CBC
HCT: 33.3 % — ABNORMAL LOW (ref 36.0–46.0)
Hemoglobin: 10.8 g/dL — ABNORMAL LOW (ref 12.0–15.0)
MCH: 25.1 pg — ABNORMAL LOW (ref 26.0–34.0)
MCHC: 32.4 g/dL (ref 30.0–36.0)
MCV: 77.4 fL — ABNORMAL LOW (ref 80.0–100.0)
Platelets: 203 10*3/uL (ref 150–400)
RBC: 4.3 MIL/uL (ref 3.87–5.11)
RDW: 20.1 % — ABNORMAL HIGH (ref 11.5–15.5)
WBC: 9.1 10*3/uL (ref 4.0–10.5)
nRBC: 11.2 % — ABNORMAL HIGH (ref 0.0–0.2)

## 2023-10-21 LAB — RENAL FUNCTION PANEL
Albumin: 3.1 g/dL — ABNORMAL LOW (ref 3.5–5.0)
Anion gap: 16 — ABNORMAL HIGH (ref 5–15)
BUN: 29 mg/dL — ABNORMAL HIGH (ref 8–23)
CO2: 22 mmol/L (ref 22–32)
Calcium: 8.7 mg/dL — ABNORMAL LOW (ref 8.9–10.3)
Chloride: 91 mmol/L — ABNORMAL LOW (ref 98–111)
Creatinine, Ser: 3.9 mg/dL — ABNORMAL HIGH (ref 0.44–1.00)
GFR, Estimated: 11 mL/min — ABNORMAL LOW (ref >60)
Glucose, Bld: 197 mg/dL — ABNORMAL HIGH (ref 70–99)
Phosphorus: 5.3 mg/dL — ABNORMAL HIGH (ref 2.5–4.6)
Potassium: 4.3 mmol/L (ref 3.5–5.1)
Sodium: 129 mmol/L — ABNORMAL LOW (ref 135–145)

## 2023-10-21 MED ORDER — ONDANSETRON HCL 4 MG/2ML IJ SOLN
4.0000 mg | Freq: Four times a day (QID) | INTRAMUSCULAR | Status: DC | PRN
  Filled 2023-10-21: qty 2

## 2023-10-21 NOTE — Plan of Care (Signed)
    Referral received for [name] Antis Dunk :goals of care discussion. Chart reviewed and updates received from RN. Patient assessed and she states she feels fine, though she prefers to rest and does not engage in discussion after introduction of role of PMT in her care. She gives permission for this PA to call her daughter(s) to arrange family meeting for GOC discussion at a later date.  Attempted to contact patient's daughters [name] and [place]. Unable to reach. Voicemail left with contact information given.   PMT will re-attempt to contact family at a later time/date. Detailed note and recommendations to follow once GOC has been completed.   Thank you for your referral and allowing PMT to assist in Mrs. [name] [name]'s care.   Richardson Dopp, [facility] Palliative Medicine Team  Team Phone # [phone]   NO CHARGE

## 2023-10-21 NOTE — Progress Notes (Signed)
 Nephrology Follow-Up Consult note   Outpatient dialysis unit: [facility] Outpatient dialysis prescription: MWF. F180, 2K, 2Ca. Duration: 3h, 45 min. EDW 63.4 kg. Access: LUE AVG. Mircera 150 mcg last given 3/10 and receives calcitriol 1.75 mcg with each session.   Assessment/Recommendations: [name] [name] is a/an 88 y.o. female with a past medical history notable for ESRD on HD MWF admitted with weakness/failure to thrive.    # ESRD: Cont HD on MWF schedule while here   # Volume/ hypertension: maintain edw as able. Near baseline   # Anemia of Chronic Kidney Disease: Hgb at goal. Last mircera on 3/10   # Secondary Hyperparathyroidism/Hyperphosphatemia: calcitriol with HD sessions. Phos near goal   # Vascular access: LUE AVG.   # Weakness: Per primary. PT consulted, anticipate some form of therapy will be recommended at time of discharge.   # Abnormal EKG: Cardiology recommends medical management.  No plans for intervention   # Type 2 diabetes mellitus: Per primary.   # Paroxysmal atrial fibrillation: Continue home metoprolol 12.5 mg daily. Per primary.   # Disposition: Patient has expressed disinterest with dialysis in the past.  We have had goals of care conversations in the outpatient setting and for now the patient is continuing dialysis with a sitter.  Family is open to further goals of care conversations.  Agree with palliative care involvement   # Additional recommendations: - Dose all meds for creatinine clearance < 10 ml/min  - Unless absolutely necessary, no MRIs with gadolinium.  - Implement save arm precautions.  Prefer needle sticks in the dorsum of the hands or wrists.  No blood pressure measurements in arm. - If blood transfusion is requested during hemodialysis sessions, please alert [place] prior to the session.  - Use synthetic opioids (Fentanyl/Dilaudid) if needed   Recommendations conveyed to primary service.    [name] [facility] 10/21/2023 8:25 AM  ___________________________________________________________  CC: Weakness  Interval History/Subjective: Patient feels well with no complaints today.   Medications:  Current Facility-Administered Medications  Medication Dose Route Frequency Provider Last Rate Last Admin   acetaminophen (TYLENOL) tablet 650 mg  650 mg Oral Q6H PRN Dahal, [name], MD       Or   acetaminophen (TYLENOL) suppository 650 mg  650 mg Rectal Q6H PRN Dahal, Binaya, MD       albuterol (PROVENTIL) (2.5 MG/3ML) 0.083% nebulizer solution 2.5 mg  2.5 mg Nebulization Q6H PRN Dahal, Binaya, MD       apixaban (ELIQUIS) tablet 2.5 mg  2.5 mg Oral BID Silvana Newness, RPH   2.5 mg at 10/20/23 2121   aspirin EC tablet 81 mg  81 mg Oral Daily Tobb, Kardie, DO   81 mg at 10/20/23 1251   atorvastatin (LIPITOR) tablet 20 mg  20 mg Oral Daily Osvaldo Shipper, MD   20 mg at 10/20/23 1252   bisacodyl (DULCOLAX) EC tablet 5 mg  5 mg Oral Daily PRN Dahal, [name], MD       Chlorhexidine Gluconate Cloth 2 % PADS 6 each  6 each Topical Q0600 [name], MD   6 each at 10/21/23 0535   donepezil (ARICEPT) tablet 5 mg  5 mg Oral QHS Osvaldo Shipper, MD   5 mg at 10/20/23 2121   hydrALAZINE (APRESOLINE) injection 10 mg  10 mg Intravenous Q6H PRN Dahal, [name], MD       lanthanum (FOSRENOL) chewable tablet 1,000 mg  1,000 mg Oral TID WC Osvaldo Shipper,  MD   1,000 mg at 10/20/23 1818   metoprolol tartrate (LOPRESSOR) tablet 12.5 mg  12.5 mg Oral BID Dahal, [name], MD   12.5 mg at 10/20/23 2121   polyethylene glycol (MIRALAX / GLYCOLAX) packet 17 g  17 g Oral Daily PRN Dahal, [name], MD          Review of Systems: 10 systems reviewed and negative except per interval history/subjective  Physical Exam: Vitals:   10/21/23 0321 10/21/23 0500  BP: (!) 120/45   Pulse: 62 60  Resp: 14 19  Temp: 98 F (36.7 C)   SpO2: 96% 97%   No intake/output data recorded.  Intake/Output Summary (Last 24 hours)  at 10/21/2023 0825 Last data filed at 10/21/2023 0100 Gross per 24 hour  Intake 570 ml  Output 2000 ml  Net -1430 ml   Constitutional: well-appearing, no acute distress ENMT: ears and nose without scars or lesions, MMM CV: normal rate, no edema Respiratory: Bilateral chest rise, normal work of breathing Gastrointestinal: soft, non-tender, no palpable masses or hernias Skin: no visible lesions or rashes Psych: alert, appropriate mood and affect   Test Results I personally reviewed new and old clinical labs and radiology tests Lab Results  Component Value Date   NA 129 (L) 10/21/2023   K 4.3 10/21/2023   CL 91 (L) 10/21/2023   CO2 22 10/21/2023   BUN 29 (H) 10/21/2023   CREATININE 3.90 (H) 10/21/2023   CALCIUM 8.7 (L) 10/21/2023   ALBUMIN 3.1 (L) 10/21/2023   PHOS 5.3 (H) 10/21/2023    CBC Recent Labs  Lab 10/14/23 1714 10/14/23 1719 10/19/23 0248 10/20/23 0405 10/21/23 0359  WBC 4.9   < > 7.2 7.5 9.1  NEUTROABS 4.0  --   --   --   --   HGB 10.1*   < > 10.6* 11.5* 10.8*  HCT 30.9*   < > 32.6* 35.3* 33.3*  MCV 78.4*   < > 77.4* 77.9* 77.4*  PLT 198   < > 209 206 203   < > = values in this interval not displayed.

## 2023-10-21 NOTE — Evaluation (Signed)
 Occupational Therapy Evaluation Patient Details Name: Anamarie Hunn MRN: [id number] DOB: 05/30/1936 Today's Date: 10/21/2023   History of Present Illness   88 y.o. female presents to [facility] 10/18/23 with generalized weakness after d/c from ED 3/8 for weakness, diarrhea and after missing 1-2 dialysis sessions. Possible infarct from 3/8 to 3/12.  PMH: arthritis, CHF, CKD, dementia, DM, HTN, sleep apnea, TIA, ESRD on HD     Clinical Impressions Patient is currently requiring as high as Total assistance with basic ADLs including post-bowel movement hygiene, as well as  contact guard assist with bed mobility and contact guard assist with functional transfers to stand and take a few lateral steps with use of RW.   Current level of function is below patient's typical baseline.    During this evaluation, patient was limited by severe memory deficits, generalized weakness, impaired activity tolerance and malaise, and vision deficits, all of which has the potential to impact patient's and/or caregivers' safety and independence during functional mobility, as well as performance for ADLs.    Patient lives with her daughter and son in law who would be unable to safely provide the necessary supervision and assistance that pt currently requires.  Patient demonstrates fair rehab potential, and should benefit from continued skilled occupational therapy services while in acute care to maximize safety, independence and quality of life at home.  Continued occupational therapy services in acute care are recommended. Patient will benefit from continued inpatient follow up therapy, <3 hours/day.   ?      If plan is discharge home, recommend the following:   Supervision due to cognitive status;A little help with walking and/or transfers;A lot of help with bathing/dressing/bathroom     Functional Status Assessment   Patient has had a recent decline in their functional status and demonstrates the ability  to make significant improvements in function in a reasonable and predictable amount of time.     Equipment Recommendations   None recommended by OT     Recommendations for Other Services         Precautions/Restrictions   Precautions Precautions: Fall Recall of Precautions/Restrictions: Impaired Restrictions Weight Bearing Restrictions Per Provider Order: No     Mobility Bed Mobility Overal bed mobility: Needs Assistance Bed Mobility: Supine to Sit, Sit to Supine     Supine to sit: Contact guard, HOB elevated Sit to supine: Supervision, HOB elevated (Significantly increased time.)        Transfers                          Balance Overall balance assessment: Needs assistance, Mild deficits observed, not formally tested Sitting-balance support: No upper extremity supported, Feet supported Sitting balance-Leahy Scale: Fair     Standing balance support: Bilateral upper extremity supported, During functional activity, Reliant on assistive device for balance Standing balance-Leahy Scale: Poor Standing balance comment: reliant on UE support                           ADL either performed or assessed with clinical judgement   ADL Overall ADL's : Needs assistance/impaired Eating/Feeding: Maximal assistance;Bed level Eating/Feeding Details (indicate cue type and reason): Dtr reports she has recently begun hand feeding pt due to pt with poor appetite and not initiating PO intake.   Grooming Details (indicate cue type and reason): Pt refused due to fatigue.  Anticipate Min-Mod As at EOB or up to Min in bed due  to decreased endurance. Upper Body Bathing: Minimal assistance;Moderate assistance;Cueing for sequencing   Lower Body Bathing: Moderate assistance;Maximal assistance;Bed level;Cueing for sequencing   Upper Body Dressing : Moderate assistance;Cueing for sequencing;Sitting   Lower Body Dressing: Moderate assistance;Bed level;Cueing for  sequencing Lower Body Dressing Details (indicate cue type and reason): Increased time, multi-modal cues, repition of instructions to don socks x4. Pt donned one sock in [place] sitting. Then fatigued and asked OT to don other sock. Toilet Transfer: Contact guard assist;Rolling walker (2 wheels);Cueing for safety Toilet Transfer Details (indicate cue type and reason): Pt stood x 2 reps from EOB to RW. Both with CGA. 1st stand noted pt soiled.  Pt sat back on clean surface to allow OT to collect supplies. Pt stood second time for hygiene. Pt took ~4 lateral steps with RW afterwards to reposition in bed and sat to EOB with CGA and cues. Toileting- Clothing Manipulation and Hygiene: Total assistance;Sit to/from stand Toileting - Clothing Manipulation Details (indicate cue type and reason): Pt invited to try her hygiene but unresponsive verbally while standing with no initiation. Therefore pt given Total Assist for hygiene. Pt forgetting why she is standing and asking to lie back down .Required frequent repition and education on imporance of skin integrity and hygiene.     Functional mobility during ADLs: Contact guard assist;Rolling walker (2 wheels);Cueing for sequencing;Cueing for safety General ADL Comments: Some of above based on general assessment due to pt fatigue and refusal to complete.     Vision Baseline Vision/History: 1 Wears glasses (dtr reports the glasses make no difference.) Ability to See in Adequate Light: 1 Impaired Additional Comments: Poor visual attention to task or people.     Perception         Praxis         Pertinent Vitals/Pain Pain Assessment Pain Assessment: No/denies pain     Extremity/Trunk Assessment Upper Extremity Assessment Upper Extremity Assessment: Right hand dominant;Generalized weakness   Lower Extremity Assessment Lower Extremity Assessment: Generalized weakness   Cervical / Trunk Assessment Cervical / Trunk Assessment: Normal   Communication  Communication Communication: No apparent difficulties Factors Affecting Communication: Hearing impaired   Cognition Arousal: Lethargic Behavior During Therapy: Flat affect Cognition: Cognition impaired, History of cognitive impairments   Orientation impairments:  (Not asked due to note that questions agitate pt.) Awareness: Online awareness impaired, Intellectual awareness impaired Memory impairment (select all impairments): Short-term memory, Declarative Iott-term memory (Very limited memory for even 1-step instructions. Ex: Pt at EOB and told to stand. Pt scooted forward stopped and asked "What you want me to do?" 1-step commdn repeated. Pt scooted more and asked same question.) Attention impairment (select first level of impairment): Focused attention Executive functioning impairment (select all impairments): Initiation, Organization, Sequencing, Reasoning, Problem solving                   Following commands: Impaired Following commands impaired: Follows one step commands inconsistently     Cueing  General Comments   Cueing Techniques: Verbal cues;Tactile cues      Exercises     Shoulder Instructions      Home Living Family/patient expects to be discharged to:: Private residence Living Arrangements: Children (Dtr and SIL) Available Help at Discharge: Family;Available 24 hours/day Type of Home: House Home Access: Level entry     Home Layout: One level     Bathroom Shower/Tub: Tub/shower unit;Sponge bathes at baseline   [employer]: Standard     Home Equipment: Cane - single point;Shower  seat;Grab bars - tub/shower;Hand held shower head;Rolling Walker (2 wheels);Rollator (4 wheels);BSC/3in1;Wheelchair - Civil engineer, contracting: [place]-handled sponge Additional Comments: Dtr works days and SIL at home with pt. Pt is never alone.      Prior Functioning/Environment Prior Level of Function : Needs assist;History of Falls (last six  months)       Physical Assist : Mobility (physical);ADLs (physical) Mobility (physical): Transfers;Gait ADLs (physical): Bathing;Dressing;IADLs Mobility Comments: reports needing assistance to stand and walk only recently. Used SP Cane. Tends to hold daughter's arm OR use cane. ADLs Comments: Was dressing herself until about 2 weeks ago due to [facility]. Pt is usually continent of B/B but wear Depends "just in case".  Ind with post-void hygiene.    OT Problem List: Decreased strength;Decreased cognition;Decreased safety awareness;Decreased range of motion;Decreased activity tolerance;Decreased knowledge of use of DME or AE;Impaired UE functional use;Decreased knowledge of precautions;Impaired balance (sitting and/or standing);Impaired vision/perception;Cardiopulmonary status limiting activity   OT Treatment/Interventions: Self-care/ADL training;Therapeutic activities;Cognitive remediation/compensation;Therapeutic exercise;Visual/perceptual remediation/compensation;Patient/family education;Energy conservation;DME and/or AE instruction;Balance training      OT Goals(Current goals can be found in the care plan section)   Acute Rehab OT Goals Patient Stated Goal: Per daughter, for pt to return to her baseline, "I can't believe how weeak she began so fast". OT Goal Formulation: With family Time For Goal Achievement: 11/04/23 Potential to Achieve Goals: Fair ADL Goals Pt Will Perform Eating: sitting;with min assist (up to chair for at least 2/3 meals a day and pt completing at least 50% of 2 meals a day) Pt Will Perform Grooming: standing;with supervision (tolerating at least one full task with VSS) Pt Will Transfer to Toilet: ambulating;with supervision;bedside commode Pt Will Perform Toileting - Clothing Manipulation and hygiene: with supervision;sitting/lateral leans;sit to/from stand Pt/caregiver will Perform Home Exercise Program: Increased ROM;Increased strength;Both right and left upper  extremity;With Supervision (Tolerating at least 10 reps of 1 set in least restritive position able for safety) Additional ADL Goal #1: Patient will identify at least 3 energy conservation strategies to employ at home in order to maximize function and quality of life and decrease caregiver burden while preventing exacerbation of symptoms and rehospitalization. Additional ADL Goal #2: Patient will identify at least 3 fall prevention strategies to employ at home in order to maximize function and safety during ADLs and decrease caregiver burden while preventing possible injury and rehospitalization.   OT Frequency:  Min 1X/week    Co-evaluation              AM-PAC OT "6 Clicks" Daily Activity     Outcome Measure Help from another person eating meals?: A Lot Help from another person taking care of personal grooming?: A Lot Help from another person toileting, which includes using toliet, bedpan, or urinal?: Total Help from another person bathing (including washing, rinsing, drying)?: A Lot Help from another person to put on and taking off regular upper body clothing?: A Lot Help from another person to put on and taking off regular lower body clothing?: Total 6 Click Score: 10   End of Session Equipment Utilized During Treatment: Gait belt;Rolling walker (2 wheels) Nurse Communication: Other (comment) (Pt with bowel void per strict I&Os)  Activity Tolerance: Patient limited by fatigue;Patient limited by lethargy Patient left: in bed;with call bell/phone within reach;with bed alarm set;with family/visitor present  OT Visit Diagnosis: Unsteadiness on feet (R26.81);Adult, failure to thrive (R62.7);Feeding difficulties (R63.3);History of falling (Z91.81);Muscle weakness (generalized) (M62.81);Other symptoms and signs involving cognitive function  Time: 1610-9604 OT Time Calculation (min): 37 min Charges:  OT General Charges $OT Visit: 1 Visit OT Evaluation $OT Eval Low  Complexity: 1 Low OT Treatments $Self Care/Home Management : 8-22 mins  Victorino Dike, OT Acute Rehab Services Office: [phone] 10/21/2023   Theodoro Clock 10/21/2023, 11:39 AM

## 2023-10-21 NOTE — Progress Notes (Signed)
 Progress Note  Patient Name: [name] [name] Date of Encounter: 10/21/2023  Primary Cardiologist: Elder Negus, MD   Subjective   Patient seen examined at her bedside.  Her daughter was by the bedside during the time of my arrival.  She had a few questions and was able to answer.  No other complaints at this time.  Inpatient Medications    Scheduled Meds:  apixaban  2.5 mg Oral BID   aspirin EC  81 mg Oral Daily   atorvastatin  20 mg Oral Daily   Chlorhexidine Gluconate Cloth  6 each Topical Q0600   donepezil  5 mg Oral QHS   lanthanum  1,000 mg Oral TID WC   metoprolol tartrate  12.5 mg Oral BID   Continuous Infusions:  PRN Meds: acetaminophen **OR** acetaminophen, albuterol, bisacodyl, hydrALAZINE, polyethylene glycol   Vital Signs    Vitals:   10/20/23 2327 10/21/23 0321 10/21/23 0500 10/21/23 1006  BP: (!) 117/47 (!) 120/45  (!) 108/56  Pulse: (!) 56 62 60 (!) 48  Resp: 17 14 19  (!) 23  Temp: 97.9 F (36.6 C) 98 F (36.7 C)  98.1 F (36.7 C)  TempSrc: Oral Oral  Oral  SpO2: 94% 96% 97% 92%  Weight:   61 kg   Height:        Intake/Output Summary (Last 24 hours) at 10/21/2023 1123 Last data filed at 10/21/2023 0100 Gross per 24 hour  Intake 570 ml  Output 2000 ml  Net -1430 ml   Filed Weights   10/20/23 0806 10/20/23 1219 10/21/23 0500  Weight: 61.8 kg 59.8 kg 61 kg    Telemetry     - Personally Reviewed  ECG     - Personally Reviewed  Physical Exam     General: Comfortable Head: Atraumatic, normal size  Eyes: PEERLA, EOMI  Neck: Supple, normal JVD Cardiac: Normal S1, S2; RRR; no murmurs, rubs, or gallops Lungs: Clear to auscultation bilaterally Abd: Soft, nontender, no hepatomegaly  Ext: warm, no edema Musculoskeletal: No deformities, BUE and BLE strength normal and equal Skin: Warm and dry, no rashes   Neuro: Alert and oriented to person, place, time, and situation, CNII-XII grossly intact, no focal deficits  Psych:  Normal mood and affect   Labs    Chemistry Recent Labs  Lab 10/14/23 1714 10/14/23 1719 10/19/23 0248 10/20/23 0406 10/21/23 0359  NA 129*   < > 134* 132* 129*  K 4.7   < > 4.3 4.9 4.3  CL 90*   < > 95* 92* 91*  CO2 23   < > 22 21* 22  GLUCOSE 170*   < > 183* 140* 197*  BUN 74*   < > 56* 35* 29*  CREATININE 6.96*   < > 6.54* 4.47* 3.90*  CALCIUM 8.4*   < > 8.8* 8.8* 8.7*  PROT 6.8  --   --   --   --   ALBUMIN 3.6  --   --  3.1* 3.1*  AST 30  --   --   --   --   ALT 39  --   --   --   --   ALKPHOS 76  --   --   --   --   BILITOT 0.7  --   --   --   --   GFRNONAA 5*   < > 6* 9* 11*  ANIONGAP 16*   < > 17* 19* 16*   < > =  values in this interval not displayed.     Hematology Recent Labs  Lab 10/19/23 0248 10/20/23 0405 10/21/23 0359  WBC 7.2 7.5 9.1  RBC 4.21 4.53 4.30  HGB 10.6* 11.5* 10.8*  HCT 32.6* 35.3* 33.3*  MCV 77.4* 77.9* 77.4*  MCH 25.2* 25.4* 25.1*  MCHC 32.5 32.6 32.4  RDW 19.4* 20.0* 20.1*  PLT 209 206 203    Cardiac EnzymesNo results for input(s): "TROPONINI" in the last 168 hours. No results for input(s): "TROPIPOC" in the last 168 hours.   BNPNo results for input(s): "BNP", "PROBNP" in the last 168 hours.   DDimer No results for input(s): "DDIMER" in the last 168 hours.   Radiology    No results found.  Cardiac Studies   Echo reviewed  Patient Profile     88 y.o. female with hx of ESRD HD MWF, DM2, HTN, HLD, OSA, CHF, paroxysmal A-fib, TIA,   Assessment & Plan    Elevated troponin/abnormal EKG  Paroxysmal atrial fibrillation Chronic heart failure with preserved ejection fraction Acute on chronic kidney disease History of TIA Hyperlipidemia Hypertension Diabetes mellitus    Patient see and examined at her bedside. Her daughter is at her bedside.  She offers no complaints at this time.  Completed 48 hr heparin gtt, continue aspirin, no plans for ischemic evaluation at this time.  Please continue her Lipitor 20 mg daily.  Continue Aspirin for now and monitor hemoglobin May need to transition to Toprol XL 12.5 mg , currently on lopressor 12.5 BID  Contine Eliquis  She will benefit from PT/OT       For questions or updates, please contact [facility] Please consult [email or web address] for contact info under Cardiology/STEMI.      Signed, Thomasene Ripple, DO  10/21/2023, 11:23 AM

## 2023-10-21 NOTE — Progress Notes (Signed)
 [facility] PROGRESS NOTE   [name] [name] MWN:[id number] DOB: 02-Aug-1936 DOA: 10/18/2023  PCP: Dorothyann Peng, MD  Brief History: 88 y.o. female with PMH significant for ESRD HD MWF, DM2, HTN, HLD, OSA, CHF, paroxysmal A-fib, TIA, arthritis who was brought in from home by EMS for generalized weakness.  Patient was found to have abnormal EKG and elevated troponin levels.  Concern was for a cardiac etiology for her symptoms.  She was hospitalized for further management.  Consultants: Nephrology.  Cardiology.  Palliative care  Procedures: Hemodialysis    Subjective/Interval History: Patient lying comfortably on the bed.  In no distress or discomfort.  Denies any pain.     Assessment/Plan:  Abnormal EKG with elevated troponin levels/possible ACS EKG showed inferior Q waves and concern for ST elevation.  Case was discussed with cardiology.  They suspect that she had a recent cardiac event.  Patient was noted to have elevated troponin levels.  Patient was started on IV heparin.  Oral anticoagulants were held. Echocardiogram was done which showed EF of 55 to 60% without any wall motion abnormalities.  Grade 1 diastolic dysfunction noted.  No significant valvular disease.  Small pericardial effusion was noted. Cardiology recommending medical management.  Patient is on aspirin, statin, beta-blocker.  IV heparin transitioned back to apixaban.  Generalized weakness Possibly due to ACS.  Seen by physical therapy.  Home health is recommended.  End-stage renal disease on hemodialysis She is dialyzed on Monday Wednesday Friday schedule.  Nephrology is following.  Dialyzed as per usual schedule. Patient has missed her outpatient dialysis sessions due to weakness.   Palliative care has been consulted to assist with goals of care as nephrology is concerned that patient may not be able to tolerate outpatient dialysis going forward.    Recent diarrhea Denies any diarrhea currently.   Monitor for now.  Diabetes mellitus type 2 HbA1c was 5.9 in October 2024.  Monitor CBGs.  SSI.  Paroxysmal atrial fibrillation Continue metoprolol and apixaban.  Anemia of chronic kidney disease. Stable hemoglobin.  No evidence of overt bleeding.  History of TIA Stable.  Hyperlipidemia On statin.  Essential hypertension Monitor blood pressures.  Amlodipine on hold.  On metoprolol currently.  Will leave her off of amlodipine since blood pressures tend to be low especially on dialysis days.  Goals of care Palliative care consult is still pending.  Will reach out to them to see if they will be able to see her this weekend.   DVT Prophylaxis: On Eliquis prior to admission.  Being continued. Code Status: Full code Family Communication: No family at bedside Disposition Plan: Hopefully return home when improved  Status is: Inpatient Remains inpatient appropriate because: ACS.      Medications: Scheduled:  apixaban  2.5 mg Oral BID   aspirin EC  81 mg Oral Daily   atorvastatin  20 mg Oral Daily   Chlorhexidine Gluconate Cloth  6 each Topical Q0600   donepezil  5 mg Oral QHS   lanthanum  1,000 mg Oral TID WC   metoprolol tartrate  12.5 mg Oral BID   Continuous:   QIH:[id number] **OR** acetaminophen, albuterol, bisacodyl, hydrALAZINE, polyethylene glycol   Objective:  Vital Signs  Vitals:   10/20/23 1900 10/20/23 2327 10/21/23 0321 10/21/23 0500  BP: (!) 107/59 (!) 117/47 (!) 120/45   Pulse: 61 (!) 56 62 60  Resp: 18 17 14 19   Temp: 98.1 F (36.7 C) 97.9 F (36.6 C) 98 F (36.7 C)   TempSrc:  Oral Oral Oral   SpO2: 98% 94% 96% 97%  Weight:    61 kg  Height:        Intake/Output Summary (Last 24 hours) at 10/21/2023 1001 Last data filed at 10/21/2023 0100 Gross per 24 hour  Intake 570 ml  Output 2000 ml  Net -1430 ml    Filed Weights   10/20/23 0806 10/20/23 1219 10/21/23 0500  Weight: 61.8 kg 59.8 kg 61 kg    General appearance: Awake alert.   In no distress Resp: Clear to auscultation bilaterally.  Normal effort Cardio: S1-S2 is normal regular.  No S3-S4.  No rubs murmurs or bruit GI: Abdomen is soft.  Nontender nondistended.  Bowel sounds are present normal.  No masses organomegaly   Lab Results:  Data Reviewed: I have personally reviewed following labs and reports of the imaging studies  CBC: Recent Labs  Lab 10/14/23 1714 10/14/23 1719 10/18/23 1242 10/19/23 0248 10/20/23 0405 10/21/23 0359  WBC 4.9  --  8.0 7.2 7.5 9.1  NEUTROABS 4.0  --   --   --   --   --   HGB 10.1* 12.2 11.0* 10.6* 11.5* 10.8*  HCT 30.9* 36.0 34.8* 32.6* 35.3* 33.3*  MCV 78.4*  --  79.1* 77.4* 77.9* 77.4*  PLT 198  --  216 209 206 203    Basic Metabolic Panel: Recent Labs  Lab 10/14/23 1714 10/14/23 1719 10/18/23 1242 10/19/23 0248 10/20/23 0406 10/21/23 0359  NA 129* 127* 134* 134* 132* 129*  K 4.7 4.7 4.2 4.3 4.9 4.3  CL 90* 94* 93* 95* 92* 91*  CO2 23  --  22 22 21* 22  GLUCOSE 170* 164* 214* 183* 140* 197*  BUN 74* 63* 46* 56* 35* 29*  CREATININE 6.96* 7.90* 5.76* 6.54* 4.47* 3.90*  CALCIUM 8.4*  --  8.9 8.8* 8.8* 8.7*  PHOS  --   --   --   --  5.8* 5.3*    GFR: Estimated Creatinine Clearance: 8.4 mL/min (A) (by C-G formula based on SCr of 3.9 mg/dL (H)).  Liver Function Tests: Recent Labs  Lab 10/14/23 1714 10/20/23 0406 10/21/23 0359  AST 30  --   --   ALT 39  --   --   ALKPHOS 76  --   --   BILITOT 0.7  --   --   PROT 6.8  --   --   ALBUMIN 3.6 3.1* 3.1*    Recent Labs  Lab 10/14/23 1714  LIPASE 47     CBG: Recent Labs  Lab 10/14/23 1543 10/18/23 2008 10/19/23 0635 10/19/23 1922  GLUCAP 159* 174* 112* 120*    Radiology Studies: No results found.      LOS: 3 days   Michail Boyte [employer] on [email or web address]  10/21/2023, 10:01 AM

## 2023-10-22 ENCOUNTER — Encounter (HOSPITAL_COMMUNITY): Admission: EM | Disposition: E | Payer: Self-pay | Source: Home / Self Care | Attending: Internal Medicine

## 2023-10-22 ENCOUNTER — Inpatient Hospital Stay (HOSPITAL_COMMUNITY)

## 2023-10-22 DIAGNOSIS — I480 Paroxysmal atrial fibrillation: Secondary | ICD-10-CM | POA: Diagnosis not present

## 2023-10-22 DIAGNOSIS — R9431 Abnormal electrocardiogram [ECG] [EKG]: Secondary | ICD-10-CM

## 2023-10-22 DIAGNOSIS — R7989 Other specified abnormal findings of blood chemistry: Secondary | ICD-10-CM | POA: Diagnosis not present

## 2023-10-22 DIAGNOSIS — I213 ST elevation (STEMI) myocardial infarction of unspecified site: Principal | ICD-10-CM

## 2023-10-22 DIAGNOSIS — N186 End stage renal disease: Secondary | ICD-10-CM | POA: Diagnosis not present

## 2023-10-22 DIAGNOSIS — I214 Non-ST elevation (NSTEMI) myocardial infarction: Secondary | ICD-10-CM | POA: Diagnosis not present

## 2023-10-22 DIAGNOSIS — Z515 Encounter for palliative care: Secondary | ICD-10-CM

## 2023-10-22 DIAGNOSIS — Z992 Dependence on renal dialysis: Secondary | ICD-10-CM | POA: Diagnosis not present

## 2023-10-22 DIAGNOSIS — Z7189 Other specified counseling: Secondary | ICD-10-CM

## 2023-10-22 LAB — CBC
HCT: 37.6 % (ref 36.0–46.0)
Hemoglobin: 11.9 g/dL — ABNORMAL LOW (ref 12.0–15.0)
MCH: 25.2 pg — ABNORMAL LOW (ref 26.0–34.0)
MCHC: 31.6 g/dL (ref 30.0–36.0)
MCV: 79.5 fL — ABNORMAL LOW (ref 80.0–100.0)
Platelets: 147 10*3/uL — ABNORMAL LOW (ref 150–400)
RBC: 4.73 MIL/uL (ref 3.87–5.11)
RDW: 20.8 % — ABNORMAL HIGH (ref 11.5–15.5)
WBC: 10.8 10*3/uL — ABNORMAL HIGH (ref 4.0–10.5)
nRBC: 14.3 % — ABNORMAL HIGH (ref 0.0–0.2)

## 2023-10-22 LAB — ECHOCARDIOGRAM LIMITED
Height: 59 in
S' Lateral: 2.8 cm
Weight: 2116.42 [oz_av]

## 2023-10-22 LAB — RENAL FUNCTION PANEL
Albumin: 3.1 g/dL — ABNORMAL LOW (ref 3.5–5.0)
Anion gap: 27 — ABNORMAL HIGH (ref 5–15)
BUN: 49 mg/dL — ABNORMAL HIGH (ref 8–23)
CO2: 14 mmol/L — ABNORMAL LOW (ref 22–32)
Calcium: 9.1 mg/dL (ref 8.9–10.3)
Chloride: 92 mmol/L — ABNORMAL LOW (ref 98–111)
Creatinine, Ser: 5.32 mg/dL — ABNORMAL HIGH (ref 0.44–1.00)
GFR, Estimated: 7 mL/min — ABNORMAL LOW (ref >60)
Glucose, Bld: 153 mg/dL — ABNORMAL HIGH (ref 70–99)
Phosphorus: 7.5 mg/dL — ABNORMAL HIGH (ref 2.5–4.6)
Potassium: 5 mmol/L (ref 3.5–5.1)
Sodium: 133 mmol/L — ABNORMAL LOW (ref 135–145)

## 2023-10-22 SURGERY — CORONARY/GRAFT ACUTE MI REVASCULARIZATION
Anesthesia: LOCAL

## 2023-10-22 MED ORDER — DEXTROSE 5 % IV SOLN
0.0500 ug/kg/min | INTRAVENOUS | Status: DC
  Filled 2023-10-22: qty 0.8

## 2023-10-22 MED ORDER — POLYVINYL ALCOHOL 1.4 % OP SOLN: 1.0000 [drp] | Freq: Four times a day (QID) | OPHTHALMIC | Status: DC | PRN

## 2023-10-22 MED ORDER — LORAZEPAM 2 MG/ML PO CONC: 1.0000 mg | ORAL | Status: DC | PRN

## 2023-10-22 MED ORDER — SODIUM CHLORIDE 0.9 % IV BOLUS
500.0000 mL | Freq: Once | INTRAVENOUS | Status: AC
  Administered 2023-10-22: 500 mL via INTRAVENOUS

## 2023-10-22 MED ORDER — LORAZEPAM 2 MG/ML IJ SOLN: 1.0000 mg | INTRAMUSCULAR | Status: DC | PRN

## 2023-10-22 MED ORDER — GLYCOPYRROLATE 0.2 MG/ML IJ SOLN: 0.2000 mg | INTRAMUSCULAR | Status: DC | PRN

## 2023-10-22 MED ORDER — HALOPERIDOL 0.5 MG PO TABS: 0.5000 mg | ORAL_TABLET | ORAL | Status: DC | PRN

## 2023-10-22 MED ORDER — BIOTENE DRY MOUTH MT LIQD: 15.0000 mL | OROMUCOSAL | Status: DC | PRN

## 2023-10-22 MED ORDER — LORAZEPAM 1 MG PO TABS: 1.0000 mg | ORAL_TABLET | ORAL | Status: DC | PRN

## 2023-10-22 MED ORDER — SODIUM BICARBONATE 650 MG PO TABS: 1300.0000 mg | ORAL_TABLET | Freq: Two times a day (BID) | ORAL | Status: DC

## 2023-10-22 MED ORDER — HALOPERIDOL LACTATE 2 MG/ML PO CONC: 0.5000 mg | ORAL | Status: DC | PRN

## 2023-10-22 MED ORDER — HALOPERIDOL LACTATE 5 MG/ML IJ SOLN
0.5000 mg | INTRAMUSCULAR | Status: DC | PRN
Start: 2023-10-22 — End: 2023-10-22

## 2023-10-22 MED ORDER — NOREPINEPHRINE 4 MG/250ML-% IV SOLN
0.0000 ug/min | INTRAVENOUS | Status: DC
  Administered 2023-10-22: 2 ug/min via INTRAVENOUS
  Filled 2023-10-22: qty 250

## 2023-10-22 MED ORDER — MORPHINE SULFATE (PF) 2 MG/ML IV SOLN: 1.0000 mg | INTRAVENOUS | Status: DC | PRN

## 2023-10-22 MED ORDER — HYDROMORPHONE HCL 1 MG/ML IJ SOLN: 1.0000 mg | INTRAMUSCULAR | Status: DC | PRN

## 2023-10-22 MED ORDER — GLYCOPYRROLATE 1 MG PO TABS: 1.0000 mg | ORAL_TABLET | ORAL | Status: DC | PRN

## 2023-11-06 DIAGNOSIS — E1122 Type 2 diabetes mellitus with diabetic chronic kidney disease: Secondary | ICD-10-CM | POA: Diagnosis not present

## 2023-11-06 DIAGNOSIS — N186 End stage renal disease: Secondary | ICD-10-CM | POA: Diagnosis not present

## 2023-11-06 DIAGNOSIS — Z992 Dependence on renal dialysis: Secondary | ICD-10-CM | POA: Diagnosis not present

## 2023-11-07 NOTE — Progress Notes (Signed)
 Progress Note  Patient Name: [name] [name] Date of Encounter:   Primary Cardiologist: Elder Negus, MD   Subjective   Patient seen examined at her bedside.  Her daughter was by the bedside during the time of my arrival.  She had a few questions and was able to answer.  No other complaints at this time.  Inpatient Medications    Scheduled Meds:  aspirin EC  81 mg Oral Daily   atorvastatin  20 mg Oral Daily   Chlorhexidine Gluconate Cloth  6 each Topical Q0600   donepezil  5 mg Oral QHS   lanthanum  1,000 mg Oral TID WC   metoprolol tartrate  12.5 mg Oral BID   sodium bicarbonate  1,300 mg Oral BID   Continuous Infusions:  PRN Meds: acetaminophen **OR** acetaminophen, albuterol, bisacodyl, hydrALAZINE, ondansetron (ZOFRAN) IV, polyethylene glycol   Vital Signs    Vitals:    0500  0906  0944  0955  BP:  (!) 90/56 95/63 (!) 94/41  Pulse:      Resp:  (!) 25 (!) 25 (!) 24  Temp:  (!) 97.5 F (36.4 C)    TempSrc:      SpO2:      Weight: 60 kg     Height:        Intake/Output Summary (Last 24 hours) at  1051 Last data filed at  0600 Gross per 24 hour  Intake 100 ml  Output --  Net 100 ml   Filed Weights   10/20/23 1219 10/21/23 0500  0500  Weight: 59.8 kg 61 kg 60 kg    Telemetry     - Personally Reviewed  ECG     - Personally Reviewed  Physical Exam     General: Comfortable Head: Atraumatic, normal size  Eyes: PEERLA, EOMI  Neck: Supple, normal JVD Cardiac: Normal S1, S2; RRR; no murmurs, rubs, or gallops Lungs: Clear to auscultation bilaterally Abd: Soft, nontender, no hepatomegaly  Ext: warm, no edema Musculoskeletal: No deformities, BUE and BLE strength normal and equal Skin: Warm and dry, no rashes   Neuro: Alert and oriented to person, place, time, and situation, CNII-XII grossly intact, no focal deficits  Psych: Normal mood and affect   Labs     Chemistry Recent Labs  Lab 10/20/23 0406 10/21/23 0359  0345  NA 132* 129* 133*  K 4.9 4.3 5.0  CL 92* 91* 92*  CO2 21* 22 14*  GLUCOSE 140* 197* 153*  BUN 35* 29* 49*  CREATININE 4.47* 3.90* 5.32*  CALCIUM 8.8* 8.7* 9.1  ALBUMIN 3.1* 3.1* 3.1*  GFRNONAA 9* 11* 7*  ANIONGAP 19* 16* 27*     Hematology Recent Labs  Lab 10/20/23 0405 10/21/23 0359  0344  WBC 7.5 9.1 10.8*  RBC 4.53 4.30 4.73  HGB 11.5* 10.8* 11.9*  HCT 35.3* 33.3* 37.6  MCV 77.9* 77.4* 79.5*  MCH 25.4* 25.1* 25.2*  MCHC 32.6 32.4 31.6  RDW 20.0* 20.1* 20.8*  PLT 206 203 147*    Cardiac EnzymesNo results for input(s): "TROPONINI" in the last 168 hours. No results for input(s): "TROPIPOC" in the last 168 hours.   BNPNo results for input(s): "BNP", "PROBNP" in the last 168 hours.   DDimer No results for input(s): "DDIMER" in the last 168 hours.   Radiology    No results found.  Cardiac Studies   Echo reviewed  Patient Profile     88 y.o. female with hx of ESRD HD MWF, DM2,  HTN, HLD, OSA, CHF, paroxysmal A-fib, TIA,   Assessment & Plan   Cardiogenic Shock/STEMI Elevated troponin/abnormal EKG  Paroxysmal atrial fibrillation Chronic heart failure with preserved ejection fraction Acute on chronic kidney disease History of TIA Hyperlipidemia Hypertension Diabetes mellitus    She is in atrial fibrillation with rapid ventricular rate this morning, ST elevations more pronounced with no reciprocal changes. Echocardiogram at bedside showed low normal EF with wall motion abnormalities in the basal septal wall -  discussed with the interventional cardiologist we will take her to the cath lab this morning, discussed with advanced heart failure she will need CCU care post cath given cardiogenic shock  Starting vasopressors now.  She has completed 48-hour heparin drip and is on aspirin and has been transition back to her Eliquis.  Will stop the Eliquis and restart heparin drip for  now.    Now the biggest issue here is that nursing team reporting of some blood in the rectum during personal care.  We have to be very careful to make sure that she is not experiencing any type of bleeding.  We have to watch her hemoglobin closely with the heparin drip optimizing her medical therapy and making sure that we are not taking the patient for invasive procedures that would need more anticoagulate as well as if she gets stent dual antiplatelet therapy will be required.  I have discussed with the hospitalist as well.  Called the patient daughters to provide update ([phone]) - discussion with the family prefers the move with comfort care   Plans to speak to hospitalist as well.   For questions or updates, please contact [facility] Please consult [email or web address] for contact info under Cardiology/STEMI.      Signed, Thomasene Ripple, DO  , 10:51 AM

## 2023-11-07 NOTE — Plan of Care (Signed)
    Referral received for [name] [name] :goals of care discussion. Chart reviewed and updates received from RN. Patient assessed and is unable to engage appropriately in discussions.  Made an additional attempt to contact patient's daughters [name] and [place]. Unable to reach. Voicemail left with contact information given.   PMT will re-attempt to contact family at a later time/date. Detailed note and recommendations to follow once GOC has been completed.   Thank you for your referral and allowing PMT to assist in Mrs. [name] Antis Sharpe's care.   Richardson Dopp, [facility] Palliative Medicine Team  Team Phone # [phone]   NO CHARGE

## 2023-11-07 NOTE — Progress Notes (Signed)
 Patient changed to yellow MEWS due to HR 40s and BP 95/53. Charge RN, Judeth Cornfield, and on call provider, Dr. Loney Loh, notified. No new orders. Yellow MEWS guidelines implemented.  Patient's HR dropped to 39 within minutes of MEWS implementation; Cards on call provider, Dr. Orson Aloe, notified. No new orders.

## 2023-11-07 NOTE — Death Summary Note (Signed)
 DEATH SUMMARY   Patient Details  Name: [name] [name] MRN: [id number] DOB: 08-02-1936 [id number], [name], MD Admission/Discharge Information   Admit Date:  2023/10/30  Date of Death: Date of Death: 2023-11-03  Time of Death: Time of Death: November 10, 1315  Length of Stay: 4   Principle Cause of death: Acute coronary syndrome  Brief History: 88 y.o. female with PMH significant for ESRD HD MWF, DM2, HTN, HLD, OSA, CHF, paroxysmal A-fib, TIA, arthritis who was brought in from home by EMS for generalized weakness.  Patient was found to have abnormal EKG and elevated troponin levels.  Concern was for a cardiac etiology for her symptoms.  She was hospitalized for further management.   Hospital course: Acute coronary syndrome EKG showed inferior Q waves and concern for ST elevation.  Case was discussed with cardiology.  They suspect that she had a recent cardiac event.  Patient was noted to have elevated troponin levels.  Patient was started on IV heparin.  Oral anticoagulants were held. Echocardiogram was done which showed EF of 55 to 60% without any wall motion abnormalities.  Grade 1 diastolic dysfunction noted.  No significant valvular disease.  Small pericardial effusion was noted. Cardiology recommended medical management.  Patient was given 48 hours of heparin.  Patient was on aspirin statin beta-blocker.  Heparin was transitioned to Eliquis. Patient was noted to be more lethargic than usual this morning.  EKG shows more pronounced ST elevation.  Seen by cardiology.  She was thought to be in cardiogenic shock.  Initial plan was to take her to the Cath Lab however after discussions with family it was elected to transition her to comfort care.  She subsequently expired at 1:17 PM on 03-Nov-2023.   Hypotension/history of essential hypertension End-stage renal disease on hemodialysis  Recent diarrhea   Diabetes mellitus type 2   Paroxysmal atrial fibrillation   Anemia of chronic kidney  disease.   History of TIA   Hyperlipidemia    The results of significant diagnostics from this hospitalization (including imaging, microbiology, ancillary and laboratory) are listed below for reference.   Significant Diagnostic Studies: ECHOCARDIOGRAM LIMITED Result Date: 11/03/2023    ECHOCARDIOGRAM LIMITED REPORT   Patient Name:   [name] [name] Date of Exam: Nov 03, 2023 Medical Rec #:  [id number]              Height:       59.0 in Accession #:    [id number]             Weight:       132.3 lb Date of Birth:  05-26-1936              BSA:          1.547 m Patient Age:    87 years               BP:           86/74 mmHg Patient Gender: F                      HR:           100 bpm. Exam Location:  Inpatient Procedure: Limited Echo, Color Doppler and Cardiac Doppler (Both Spectral and            Color Flow Doppler were utilized during procedure). Indications:    R94.31 Abnormal EKG  History:        Patient has prior history of Echocardiogram examinations, most  recent 10/18/2023. CHF, Arrythmias:Atrial Fibrillation; Risk                 Factors:Hypertension, Diabetes, Dyslipidemia and Sleep Apnea.  Sonographer:    [name] [name] Senior RDCS Referring Phys: [id number] [name]  Sonographer Comments: Limited to evaluate EF, Dr Servando Salina at bedside IMPRESSIONS  1. Left ventricular ejection fraction, by estimation, is 50 to 55%. The left ventricle has low normal function. The left ventricle demonstrates regional wall motion abnormalities (see scoring diagram/findings for description). There is the interventricular septum is flattened in systole and diastole, consistent with right ventricular pressure and volume overload.  2. Right ventricular systolic function is severely reduced. The right ventricular size is severely enlarged.  3. Left atrial size was mildly dilated.  4. Right atrial size was mildly dilated.  5. The tricuspid valve does not coapt consistent with annular dilatation due to Right ventricular  dilatation . Tricuspid valve regurgitation is severe.  6. Aortic valve sclerosis is present, with no evidence of aortic valve stenosis. FINDINGS  Left Ventricle: Left ventricular ejection fraction, by estimation, is 50 to 55%. The left ventricle has low normal function. The left ventricle demonstrates regional wall motion abnormalities. The left ventricular internal cavity size was normal in size. The interventricular septum is flattened in systole and diastole, consistent with right ventricular pressure and volume overload.  LV Wall Scoring: The basal inferolateral segment, basal inferior segment, and basal inferoseptal segment are hypokinetic. Right Ventricle: The right ventricular size is severely enlarged. No increase in right ventricular wall thickness. Right ventricular systolic function is severely reduced. Left Atrium: Left atrial size was mildly dilated. Right Atrium: Right atrial size was mildly dilated. Tricuspid Valve: The tricuspid valve does not coapt consistent with annular dilatation due to Right ventricular dilatation. Tricuspid valve regurgitation is severe. Aortic Valve: Aortic valve sclerosis is present, with no evidence of aortic valve stenosis. Additional Comments: Spectral Doppler performed. Color Doppler performed.  LEFT VENTRICLE PLAX 2D LVIDd:         3.70 cm LVIDs:         2.80 cm LV PW:         1.30 cm LV IVS:        1.30 cm  RIGHT VENTRICLE TAPSE (M-mode): 0.6 cm [name] DO Electronically signed by Thomasene Ripple DO Signature Date/Time: /12:09:29 PM    Final    ECHOCARDIOGRAM COMPLETE Result Date: 10/18/2023    ECHOCARDIOGRAM REPORT   Patient Name:   [name] [name] Date of Exam: 10/18/2023 Medical Rec #:  [id number]              Height:       59.0 in Accession #:    [id number]             Weight:       130.1 lb Date of Birth:  [birth year]              BSA:          1.536 m Patient Age:    87 years               BP:           134/56 mmHg Patient Gender: F                       HR:           64 bpm. Exam Location:  Inpatient Procedure: 2D Echo, Cardiac Doppler and Color Doppler (Both Spectral  and Color            Flow Doppler were utilized during procedure). Indications:    AFIB I48.91  History:        Patient has prior history of Echocardiogram examinations, most                 recent 07/30/2020. CHF, Arrythmias:Atrial Fibrillation,                 Signs/Symptoms:Shortness of Breath; Risk Factors:Hypertension,                 Diabetes and Dyslipidemia.  Sonographer:    Rosaland Lao Sonographer#2:  Darlys Gales Referring Phys: [id number] [facility]  Sonographer Comments: Suboptimal apical window. IMPRESSIONS  1. Left ventricular ejection fraction, by estimation, is 55 to 60%. The left ventricle has normal function. The left ventricle has no regional wall motion abnormalities. Left ventricular diastolic parameters are consistent with Grade I diastolic dysfunction (impaired relaxation).  2. D-shaped septum suggestive of RV pressure/volume overload. Right ventricular systolic function is moderately reduced. The right ventricular size is moderately enlarged. There is normal pulmonary artery systolic pressure. The estimated right ventricular systolic pressure is 20.4 mmHg.  3. Left atrial size was mild to moderately dilated.  4. Right atrial size was mildly dilated.  5. A small pericardial effusion is present.  6. The mitral valve is normal in structure. No evidence of mitral valve regurgitation. No evidence of mitral stenosis. Moderate mitral annular calcification.  7. Tricuspid valve coaptation looks incomplete. The tricuspid valve is abnormal. Tricuspid valve regurgitation is moderate to severe.  8. The aortic valve is tricuspid. There is mild calcification of the aortic valve. Aortic valve regurgitation is not visualized. No aortic stenosis is present.  9. The inferior vena cava is dilated in size with >50% respiratory variability, suggesting right atrial pressure of 8 mmHg. FINDINGS   Left Ventricle: Left ventricular ejection fraction, by estimation, is 55 to 60%. The left ventricle has normal function. The left ventricle has no regional wall motion abnormalities. The left ventricular internal cavity size was normal in size. There is  no left ventricular hypertrophy. Left ventricular diastolic parameters are consistent with Grade I diastolic dysfunction (impaired relaxation). Right Ventricle: D-shaped septum suggestive of RV pressure/volume overload. The right ventricular size is moderately enlarged. No increase in right ventricular wall thickness. Right ventricular systolic function is moderately reduced. There is normal pulmonary artery systolic pressure. The tricuspid regurgitant velocity is 1.76 m/s, and with an assumed right atrial pressure of 8 mmHg, the estimated right ventricular systolic pressure is 20.4 mmHg. Left Atrium: Left atrial size was mild to moderately dilated. Right Atrium: Right atrial size was mildly dilated. Pericardium: A small pericardial effusion is present. Mitral Valve: The mitral valve is normal in structure. There is mild thickening of the mitral valve leaflet(s). Moderate mitral annular calcification. No evidence of mitral valve regurgitation. No evidence of mitral valve stenosis. Tricuspid Valve: Tricuspid valve coaptation looks incomplete. The tricuspid valve is abnormal. Tricuspid valve regurgitation is moderate to severe. Aortic Valve: The aortic valve is tricuspid. There is mild calcification of the aortic valve. Aortic valve regurgitation is not visualized. No aortic stenosis is present. Aortic valve mean gradient measures 4.0 mmHg. Aortic valve peak gradient measures 7.3 mmHg. Aortic valve area, by VTI measures 2.25 cm. Pulmonic Valve: The pulmonic valve was normal in structure. Pulmonic valve regurgitation is not visualized. Aorta: The aortic root is normal in size and structure. Venous: The inferior vena cava  is dilated in size with greater than 50%  respiratory variability, suggesting right atrial pressure of 8 mmHg. IAS/Shunts: No atrial level shunt detected by color flow Doppler.  LEFT VENTRICLE PLAX 2D LVIDd:         4.10 cm   Diastology LVIDs:         2.90 cm   LV e' medial:    5.44 cm/s LV PW:         1.20 cm   LV E/e' medial:  11.6 LV IVS:        1.10 cm   LV e' lateral:   8.27 cm/s LVOT diam:     1.70 cm   LV E/e' lateral: 7.6 LV SV:         47 LV SV Index:   30 LVOT Area:     2.27 cm  RIGHT VENTRICLE RV S prime:     7.83 cm/s TAPSE (M-mode): 1.2 cm LEFT ATRIUM             Index        RIGHT ATRIUM           Index LA diam:        2.90 cm 1.89 cm/m   RA Area:     21.20 cm LA Vol (A2C):   41.0 ml 26.69 ml/m  RA Volume:   74.00 ml  48.17 ml/m LA Vol (A4C):   72.3 ml 47.07 ml/m LA Biplane Vol: 58.2 ml 37.89 ml/m  AORTIC VALVE AV Area (Vmax):    1.92 cm AV Area (Vmean):   2.00 cm AV Area (VTI):     2.25 cm AV Vmax:           135.00 cm/s AV Vmean:          92.700 cm/s AV VTI:            0.208 m AV Peak Grad:      7.3 mmHg AV Mean Grad:      4.0 mmHg LVOT Vmax:         114.00 cm/s LVOT Vmean:        81.700 cm/s LVOT VTI:          0.206 m LVOT/AV VTI ratio: 0.99  AORTA Ao Root diam: 2.80 cm Ao Asc diam:  3.40 cm MITRAL VALVE               TRICUSPID VALVE MV Area (PHT): 2.42 cm    TR Peak grad:   12.4 mmHg MV Decel Time: 313 msec    TR Vmax:        176.00 cm/s MV E velocity: 63.20 cm/s MV A velocity: 73.10 cm/s  SHUNTS MV E/A ratio:  0.86        Systemic VTI:  0.21 m                            Systemic Diam: 1.70 cm Dalton McleanMD Electronically signed by Wilfred Lacy Signature Date/Time: 10/18/2023/3:30:45 PM    Final    DG Chest Port 1 View Result Date: 10/18/2023 CLINICAL DATA:  Shortness of breath. EXAM: PORTABLE CHEST 1 VIEW COMPARISON:  10/14/2023. FINDINGS: Low lung volume. Bilateral lung fields are clear. Bilateral costophrenic angles are clear. Stable cardio-mediastinal silhouette. No acute osseous abnormalities. The soft tissues are  within normal limits. IMPRESSION: No active disease. Electronically Signed   By: Jules Schick M.D.   On: 10/18/2023 15:29   DG Chest  2 View Result Date: 10/14/2023 CLINICAL DATA:  Missed dialysis, shortness of breath. EXAM: CHEST - 2 VIEW COMPARISON:  Chest x-ray 07/17/2023 FINDINGS: Heart is enlarged. There central pulmonary vascular congestion. There are mild patchy opacities in the right mid lung/right lower lobe. There is no pleural effusion or pneumothorax. No acute fractures are identified. IMPRESSION: 1. Cardiomegaly with central pulmonary vascular congestion. 2. Mild patchy opacities in the right mid lung/right lower lobe may represent atelectasis or infection. Electronically Signed   By: Darliss Cheney M.D.   On: 10/14/2023 17:15    Microbiology: Recent Results (from the past 240 hours)  MRSA Next Gen by PCR, Nasal     Status: None   Collection Time: 10/19/23  3:46 AM   Specimen: Nasal Mucosa; Nasal Swab  Result Value Ref Range Status   MRSA by PCR Next Gen NOT DETECTED NOT DETECTED Final    Comment: (NOTE) The GeneXpert MRSA Assay (FDA approved for NASAL specimens only), is one component of a comprehensive MRSA colonization surveillance program. It is not intended to diagnose MRSA infection nor to guide or monitor treatment for MRSA infections. Test performance is not FDA approved in patients less than 41 years old. Performed at [facility], [address], [place], [place] [address]      Signed: Osvaldo Shipper, MD

## 2023-11-07 NOTE — Progress Notes (Signed)
 Chaplain visits to provide family support after being notified of pt death via Epic chat by nurse. Pt's daughter/primary caregiver and pt's siblings are at bedside. Chaplain provides support and pastoral care. They deny further needs.

## 2023-11-07 NOTE — Progress Notes (Signed)
 Patient noted to have oozing blood from rectum, HR maintain in the 130's and BP 90/56. Dr. Rito Ehrlich notified. Orders received to give 500 cc bolus, obtain EKG, and orders placed for stat CBC. EKG obtain, showing STEMI. MD notified and he reached out to cardiology who will be seeing patient soon.

## 2023-11-07 NOTE — Progress Notes (Addendum)
 Nephrology Follow-Up Consult note   Outpatient dialysis unit: [facility] Outpatient dialysis prescription: MWF. F180, 2K, 2Ca. Duration: 3h, 45 min. EDW 63.4 kg. Access: LUE AVG. Mircera 150 mcg last given 3/10 and receives calcitriol 1.75 mcg with each session.   Assessment/Recommendations: [name] [name] is a/an 88 y.o. female with a past medical history notable for ESRD on HD MWF admitted with weakness/failure to thrive.    # ESRD: Cont HD on MWF schedule while here   # Volume/ hypertension: maintain edw as able. Near baseline   # Anemia of Chronic Kidney Disease: Hgb at goal. Last mircera on 3/10   # Secondary Hyperparathyroidism/Hyperphosphatemia: calcitriol with HD sessions. Phos near goal   # Vascular access: LUE AVG.   # Weakness: Per primary. PT consulted, anticipate some form of therapy will be recommended at time of discharge.   # Abnormal EKG: Cardiology recommends medical management.  No plans for intervention   # Type 2 diabetes mellitus: Per primary.   # Paroxysmal atrial fibrillation: Continue home metoprolol 12.5 mg daily. Per primary.  # Metabolic acidosis: Bicarb much lower at 14 today.  Plan for dialysis tomorrow.  Cause unclear but has had several bowel movements in last 24 hours. Will give a couple doses of bicarb today  # Disposition: Patient has expressed disinterest with dialysis in the past.  We have had goals of care conversations in the outpatient setting and for now the patient is continuing dialysis with a sitter.  Family is open to further goals of care conversations.  Appreciate palliative care involvement   # Additional recommendations: - Dose all meds for creatinine clearance < 10 ml/min  - Unless absolutely necessary, no MRIs with gadolinium.  - Implement save arm precautions.  Prefer needle sticks in the dorsum of the hands or wrists.  No blood pressure measurements in arm. - If blood transfusion is requested during hemodialysis  sessions, please alert [place] prior to the session.  - Use synthetic opioids (Fentanyl/Dilaudid) if needed   Recommendations conveyed to primary service.    [name] [facility]  8:48 AM  ___________________________________________________________  CC: Weakness  Interval History/Subjective: Patient has no complaints.  Some bradycardia overnight.   Medications:  Current Facility-Administered Medications  Medication Dose Route Frequency Provider Last Rate Last Admin   acetaminophen (TYLENOL) tablet 650 mg  650 mg Oral Q6H PRN Dahal, [name], MD       Or   acetaminophen (TYLENOL) suppository 650 mg  650 mg Rectal Q6H PRN Dahal, Binaya, MD       albuterol (PROVENTIL) (2.5 MG/3ML) 0.083% nebulizer solution 2.5 mg  2.5 mg Nebulization Q6H PRN Dahal, Binaya, MD       apixaban (ELIQUIS) tablet 2.5 mg  2.5 mg Oral BID Silvana Newness, RPH   2.5 mg at 10/21/23 2124   aspirin EC tablet 81 mg  81 mg Oral Daily Tobb, Kardie, DO   81 mg at 10/21/23 0842   atorvastatin (LIPITOR) tablet 20 mg  20 mg Oral Daily Osvaldo Shipper, MD   20 mg at 10/21/23 7829   bisacodyl (DULCOLAX) EC tablet 5 mg  5 mg Oral Daily PRN Dahal, [name], MD       Chlorhexidine Gluconate Cloth 2 % PADS 6 each  6 each Topical Q0600 [name], MD   6 each at  0636   donepezil (ARICEPT) tablet 5 mg  5 mg Oral QHS Osvaldo Shipper, MD   5 mg at 10/21/23 2124  hydrALAZINE (APRESOLINE) injection 10 mg  10 mg Intravenous Q6H PRN Dahal, Binaya, MD       lanthanum (FOSRENOL) chewable tablet 1,000 mg  1,000 mg Oral TID WC Osvaldo Shipper, MD   1,000 mg at 10/21/23 1713   metoprolol tartrate (LOPRESSOR) tablet 12.5 mg  12.5 mg Oral BID Dahal, [name], MD   12.5 mg at 10/21/23 2124   ondansetron (ZOFRAN) injection 4 mg  4 mg Intravenous Q6H PRN Osvaldo Shipper, MD       polyethylene glycol (MIRALAX / GLYCOLAX) packet 17 g  17 g Oral Daily PRN Dahal, [name], MD          Review of Systems: 10  systems reviewed and negative except per interval history/subjective  Physical Exam: Vitals:    0249  0417  BP: (!) 118/44 (!) 110/98  Pulse: (!) 42   Resp: 18 (!) 21  Temp: (!) 97.5 F (36.4 C) (!) 97.5 F (36.4 C)  SpO2: 95% 97%   No intake/output data recorded.  Intake/Output Summary (Last 24 hours) at  0848 Last data filed at  0600 Gross per 24 hour  Intake 100 ml  Output --  Net 100 ml   Constitutional: well-appearing, no acute distress ENMT: ears and nose without scars or lesions, MMM CV: normal rate, no edema Respiratory: Bilateral chest rise, normal work of breathing Gastrointestinal: soft, non-tender, no palpable masses or hernias Skin: no visible lesions or rashes Psych: alert, appropriate mood and affect   Test Results I personally reviewed new and old clinical labs and radiology tests Lab Results  Component Value Date   NA 133 (L)    K 5.0    CL 92 (L)    CO2 14 (L)    BUN 49 (H)    CREATININE 5.32 (H)    CALCIUM 9.1    ALBUMIN 3.1 (L)    PHOS 7.5 (H)     CBC Recent Labs  Lab 10/20/23 0405 10/21/23 0359  0344  WBC 7.5 9.1 10.8*  HGB 11.5* 10.8* 11.9*  HCT 35.3* 33.3* 37.6  MCV 77.9* 77.4* 79.5*  PLT 206 203 147*

## 2023-11-07 NOTE — Consult Note (Signed)
 Consultation Note Date: 10/21/2023   Patient Name: [name] [name]  DOB: 1936/06/18  MRN: [id number]  Age / Sex: 88 y.o., female  PCP: Dorothyann Peng, MD Referring Physician: Osvaldo Shipper, MD  Reason for Consultation: Establishing goals of care  HPI/Patient Profile: 88 y.o. female  with past medical history of ESRD HD MWF, DM2, HTN, HLD, OSA, CHF, paroxysmal A-fib, TIA, arthritis admitted on 10/18/2023 with generalized weakness.   In the ED, patient was seen by cardiology after elevated troponins and EKG showed junctional rhythm, inferior Q waves, inferolateral subtle ST elevation.  In comparison to the EKG from 3/8, Q waves were new. Recent cardiac event is suspected.  PMT has been consulted to assist with goals of care conversation.  Clinical Assessment and Goals of Care:  I have reviewed medical records including EPIC notes, labs and imaging, discussed with RN, assessed the patient and then had a phone conversation with patient's daughters [name] [name] and [name] [name] to discuss diagnosis prognosis, GOC, EOL wishes, disposition and options.  I introduced Palliative Medicine as specialized medical care for people living with serious illness. It focuses on providing relief from the symptoms and stress of a serious illness. The goal is to improve quality of life for both the patient and the family.  We discussed a brief life review of the patient and then focused on their current illness.   I attempted to elicit values and goals of care important to the patient.    Medical History Review and Understanding:  Reviewed patient's acute illness in the context of her chronic morbidities.  Reviewed today's events including and for overall prognosis.  Social History: Patient has been living with her daughter [name] [name] for the past 3 years.  Functional and Nutritional State: She was independent prior to admission, bathing herself and getting around the  house on her own. She was not eating for the few days prior to admission.  Albumin of 3.1 on 3/16.  Palliative Symptoms: Fatigue  Advance Directives: A detailed discussion regarding advanced directives was had.  Daughter [name] [name] reports she is HCPOA.  She is not sure where the documents are, though she will bring them as soon as she can.  Code Status: Concepts specific to code status, artifical feeding and hydration, and rehospitalization were considered and discussed. Recommended consideration of DNR status, understanding evidenced-based poor outcomes in similar hospitalized patients, as the cause of the arrest is likely associated with chronic/terminal disease rather than a reversible acute cardio-pulmonary event.  Patient's daughter is agreeable.   Discussion: Patient's daughter [name] [name] describes her mother as a person with decreasing quality of life ever since starting dialysis.  She hates it and struggles to go, even with strong encouragement from family.  Otherwise, she enjoys some of her life and laughing and talking with others.  We also discussed her tenuous condition today with fluctuating heart rate, and elevation on EKG results, concern for another MI.  If patient cannot tolerate dialysis any longer, [name] [name] would not want her to go through this.  She is open to ongoing discussions about whether or not patient wants to discontinue dialysis pending rest of cardiac workup today.  She also notes that her brother who lives in [place] may want to call and speak with me and I provided PMT contact information. Received update from MD that patient's family have opted for comfort care.  I then received a return call from patient's daughter [name] [name] and reviewed the above conversation with her.  She states that she is spoken  with her sister care and understands the current care plan is for comfort focused.  I explained what this means in detail.  Emotional support therapeutic listening was provided.   No other questions or concerns at this time.   Discussed the importance of continued conversation with family and the medical providers regarding overall plan of care and treatment options, ensuring decisions are within the context of the patient's values and GOCs.   Questions and concerns were addressed.  The family was encouraged to call with questions or concerns.  PMT will continue to support holistically.   SUMMARY OF RECOMMENDATIONS   -CODE STATUS changed to DNR/DNI after conversation with patient's daughter/HCPOA -Continue current care -Daughter is open to ongoing goals of care discussions -Psychosocial and emotional support provided -PMT will continue to follow and support  *Received an update from RN that patient passed shortly after the above conversations.      Primary Diagnoses: Present on Admission:  Elevated troponin  PAF (paroxysmal atrial fibrillation) (HCC)    Physical Exam Vitals and nursing note reviewed.  Constitutional:      General: She is not in acute distress.    Appearance: She is ill-appearing.  Cardiovascular:     Comments: Afib with RVR Pulmonary:     Effort: Pulmonary effort is normal. No respiratory distress.  Neurological:     Mental Status: She is lethargic.    Vital Signs: BP (!) 108/56 (BP Location: Right Wrist)   Pulse (!) 48   Temp 98.1 F (36.7 C) (Oral)   Resp (!) 23   Ht 4\' 11"  (1.499 m)   Wt 61 kg   SpO2 92%   BMI 27.16 kg/m  Pain Scale: 0-10   Pain Score: 0-No pain   SpO2: SpO2: 92 % O2 Device:SpO2: 92 % O2 Flow Rate: .    Palliative Assessment/Data: 10%    MDM: high    Dannilynn Gallina Jeni Salles, PA-C  Palliative Medicine Team Team phone # [phone]  Thank you for allowing the Palliative Medicine Team to assist in the care of this patient. Please utilize secure chat with additional questions, if there is no response within 30 minutes please call the above phone number.  Palliative Medicine Team providers  are available by phone from 7am to 7pm daily and can be reached through the team cell phone.  Should this patient require assistance outside of these hours, please call the patient's attending physician.

## 2023-11-07 NOTE — Progress Notes (Signed)
 Chaplain responds to code STEMI. Pt unresponsive and no family present. RN Nehemiah Settle denies needs and chaplain asks her to call for needs.

## 2023-11-07 NOTE — Progress Notes (Addendum)
 [facility] PROGRESS NOTE   [name] [name] WUJ:[id number] DOB: [birth year] DOA: 10/18/2023  PCP: Dorothyann Peng, MD  Brief History: 88 y.o. female with PMH significant for ESRD HD MWF, DM2, HTN, HLD, OSA, CHF, paroxysmal A-fib, TIA, arthritis who was brought in from home by EMS for generalized weakness.  Patient was found to have abnormal EKG and elevated troponin levels.  Concern was for a cardiac etiology for her symptoms.  She was hospitalized for further management.  Consultants: Nephrology.  Cardiology.  Palliative care  Procedures: Hemodialysis    Subjective/Interval History: Patient noted to be somewhat lethargic this morning.  She is unable to tell me if she is having any discomfort anywhere.  Heart rate noted to be in the 40s.  Blood pressure was also noted to be low.  Subsequently called by nursing staff that when she was being cleaned her heart rate spiked up to 230.  Blood pressure in the 90s.  She was also noted to have small amount of blood oozing from her rectal area.     Assessment/Plan:  Abnormal EKG with elevated troponin levels/possible ACS EKG showed inferior Q waves and concern for ST elevation.  Case was discussed with cardiology.  They suspect that she had a recent cardiac event.  Patient was noted to have elevated troponin levels.  Patient was started on IV heparin.  Oral anticoagulants were held. Echocardiogram was done which showed EF of 55 to 60% without any wall motion abnormalities.  Grade 1 diastolic dysfunction noted.  No significant valvular disease.  Small pericardial effusion was noted. Cardiology recommended medical management.  Patient was given 48 hours of heparin.  Patient was on aspirin statin beta-blocker.  Heparin was transitioned to Eliquis. EKG from this morning shows pronounced ST elevation with reciprocal changes.  This was done in the setting of tachycardia.  Discussed with cardiology who has reviewed EKG.  They will evaluate  patient.  They plan to repeat limited echocardiogram.  May need to be placed back on heparin but the history of blood oozing from rectal area may be problematic.  Apixaban has been placed on hold.  Remains on aspirin for now.  Cardiology concerned about cardiogenic shock.  ADDENDUM Informed by palliative care that they managed to get a hold of patient's daughter.  After discussions patient was transition to DNR/DNI.  This was communicated to patient's cardiology team.  Initially critical care medicine was also consulted but have communicated this new change in status to them.  Cardiology attempting to reach daughter to see if they would want to proceed with cardiac cath.  Cardiology spoke to patient's daughter who did not want to proceed with cardiac cath and wanted to transition to comfort in view of her deterioration.  I also called the daughter and confirmed plans for transition to comfort care.  Will notify nephrology as well.  Generalized weakness Possibly due to ACS.  Seen by physical therapy.  Home health is recommended.  Hypotension/history of essential hypertension Blood pressure noted to be in the 90s systolic patient with history of hypertension.  Was on amlodipine prior to admission which is currently on hold.  On low-dose metoprolol.  Will give a small fluid bolus today.  End-stage renal disease on hemodialysis She is dialyzed on Monday Wednesday Friday schedule.  Nephrology is following.  Dialyzed as per usual schedule. Patient has missed her outpatient dialysis sessions due to weakness.   Palliative care has been consulted to assist with goals of care as nephrology is  concerned that patient may not be able to tolerate outpatient dialysis going forward.    Hematochezia Oozing of blood noted from rectal area while patient was getting cleaned up this morning.  Hemoglobin this morning was 11.9.  Will repeat another stat hemoglobin.  Recent diarrhea Denies any diarrhea currently.   Monitor for now.  Diabetes mellitus type 2 HbA1c was 5.9 in October 2024.  Monitor CBGs.  SSI.  Paroxysmal atrial fibrillation Continue metoprolol and apixaban.  Anemia of chronic kidney disease. Hemoglobin has been stable.  See above under hematochezia.  History of TIA Stable.  Hyperlipidemia On statin.  Goals of care Palliative care is following.  They plan to get in touch with patient's daughters today.   DVT Prophylaxis: Was on Eliquis prior to admission.  Was on IV heparin in the hospital.  Eliquis was resumed but now to be held again due to hematochezia. Code Status: Now changed over to DNR. Family Communication: No family at bedside Disposition Plan: Hopefully return home when improved  Status is: Inpatient Remains inpatient appropriate because: ACS.      Medications: Scheduled:  aspirin EC  81 mg Oral Daily   atorvastatin  20 mg Oral Daily   Chlorhexidine Gluconate Cloth  6 each Topical Q0600   donepezil  5 mg Oral QHS   lanthanum  1,000 mg Oral TID WC   metoprolol tartrate  12.5 mg Oral BID   sodium bicarbonate  1,300 mg Oral BID   Continuous:   YNW:[id number] **OR** acetaminophen, albuterol, bisacodyl, hydrALAZINE, ondansetron (ZOFRAN) IV, polyethylene glycol   Objective:  Vital Signs  Vitals:    0500  0906  0944  0955  BP:  (!) 90/56 95/63 (!) 94/41  Pulse:      Resp:  (!) 25 (!) 25 (!) 24  Temp:  (!) 97.5 F (36.4 C)    TempSrc:      SpO2:      Weight: 60 kg     Height:        Intake/Output Summary (Last 24 hours) at  1001 Last data filed at  0600 Gross per 24 hour  Intake 100 ml  Output --  Net 100 ml    Filed Weights   10/20/23 1219 10/21/23 0500  0500  Weight: 59.8 kg 61 kg 60 kg    General appearance: Awake alert.  In no distress Resp: Clear to auscultation bilaterally.  Normal effort Cardio: S1-S2 is normal regular.  No S3-S4.  No rubs murmurs or bruit GI:  Abdomen is soft.  Nontender nondistended.  Bowel sounds are present normal.  No masses organomegaly    Lab Results:  Data Reviewed: I have personally reviewed following labs and reports of the imaging studies  CBC: Recent Labs  Lab 10/18/23 1242 10/19/23 0248 10/20/23 0405 10/21/23 0359  0344  WBC 8.0 7.2 7.5 9.1 10.8*  HGB 11.0* 10.6* 11.5* 10.8* 11.9*  HCT 34.8* 32.6* 35.3* 33.3* 37.6  MCV 79.1* 77.4* 77.9* 77.4* 79.5*  PLT 216 209 206 203 147*    Basic Metabolic Panel: Recent Labs  Lab 10/18/23 1242 10/19/23 0248 10/20/23 0406 10/21/23 0359  0345  NA 134* 134* 132* 129* 133*  K 4.2 4.3 4.9 4.3 5.0  CL 93* 95* 92* 91* 92*  CO2 22 22 21* 22 14*  GLUCOSE 214* 183* 140* 197* 153*  BUN 46* 56* 35* 29* 49*  CREATININE 5.76* 6.54* 4.47* 3.90* 5.32*  CALCIUM 8.9 8.8* 8.8* 8.7* 9.1  PHOS  --   --  5.8* 5.3* 7.5*    GFR: Estimated Creatinine Clearance: 6.1 mL/min (A) (by C-G formula based on SCr of 5.32 mg/dL (H)).  Liver Function Tests: Recent Labs  Lab 10/20/23 0406 10/21/23 0359  0345  ALBUMIN 3.1* 3.1* 3.1*    CBG: Recent Labs  Lab 10/18/23 2008 10/19/23 0635 10/19/23 1922  GLUCAP 174* 112* 120*    Radiology Studies: No results found.      LOS: 4 days   [name] [name] [employer] on [email or web address]  , 10:01 AM

## 2023-11-07 NOTE — Progress Notes (Signed)
 Patient was comfort care and pronounced dead at 1317 on 27-Oct-2023 by two RN's with family present. MD Rito Ehrlich notified. [facility] called. Family at bedside until about 5pm. Patient transported to morgue at 6pm.

## 2023-11-07 DEATH — deceased

## 2024-01-04 ENCOUNTER — Other Ambulatory Visit: Payer: Self-pay | Admitting: Internal Medicine

## 2024-01-04 DIAGNOSIS — R413 Other amnesia: Secondary | ICD-10-CM
